# Patient Record
Sex: Male | Born: 1945 | Race: White | Hispanic: No | Marital: Married | Smoking: Former smoker
Health system: Southern US, Community
[De-identification: ages and names within clinical notes are randomized; demographics above are authoritative.]

## PROBLEM LIST (undated history)

## (undated) DIAGNOSIS — I779 Disorder of arteries and arterioles, unspecified: Secondary | ICD-10-CM

## (undated) DIAGNOSIS — I2510 Atherosclerotic heart disease of native coronary artery without angina pectoris: Secondary | ICD-10-CM

## (undated) DIAGNOSIS — Z720 Tobacco use: Secondary | ICD-10-CM

## (undated) DIAGNOSIS — I2102 ST elevation (STEMI) myocardial infarction involving left anterior descending coronary artery: Secondary | ICD-10-CM

## (undated) DIAGNOSIS — I5042 Chronic combined systolic (congestive) and diastolic (congestive) heart failure: Secondary | ICD-10-CM

## (undated) DIAGNOSIS — K219 Gastro-esophageal reflux disease without esophagitis: Secondary | ICD-10-CM

## (undated) DIAGNOSIS — I639 Cerebral infarction, unspecified: Secondary | ICD-10-CM

## (undated) DIAGNOSIS — E785 Hyperlipidemia, unspecified: Secondary | ICD-10-CM

## (undated) DIAGNOSIS — I739 Peripheral vascular disease, unspecified: Secondary | ICD-10-CM

## (undated) DIAGNOSIS — I10 Essential (primary) hypertension: Secondary | ICD-10-CM

## (undated) DIAGNOSIS — Z8719 Personal history of other diseases of the digestive system: Secondary | ICD-10-CM

## (undated) DIAGNOSIS — I255 Ischemic cardiomyopathy: Secondary | ICD-10-CM

## (undated) DIAGNOSIS — H539 Unspecified visual disturbance: Secondary | ICD-10-CM

## (undated) DIAGNOSIS — R7303 Prediabetes: Secondary | ICD-10-CM

## (undated) DIAGNOSIS — I63532 Cerebral infarction due to unspecified occlusion or stenosis of left posterior cerebral artery: Secondary | ICD-10-CM

## (undated) HISTORY — DX: Unspecified visual disturbance: H53.9

## (undated) HISTORY — PX: TRANSTHORACIC ECHOCARDIOGRAM: SHX275

## (undated) HISTORY — DX: Essential (primary) hypertension: I10

## (undated) HISTORY — DX: Atherosclerotic heart disease of native coronary artery without angina pectoris: I25.10

## (undated) HISTORY — DX: ST elevation (STEMI) myocardial infarction involving left anterior descending coronary artery: I21.02

## (undated) HISTORY — DX: Hyperlipidemia, unspecified: E78.5

## (undated) HISTORY — DX: Cerebral infarction, unspecified: I63.9

## (undated) HISTORY — DX: Personal history of other diseases of the digestive system: Z87.19

## (undated) HISTORY — PX: OTHER SURGICAL HISTORY: SHX169

## (undated) HISTORY — DX: Cerebral infarction due to unspecified occlusion or stenosis of left posterior cerebral artery: I63.532

## (undated) HISTORY — DX: Chronic combined systolic (congestive) and diastolic (congestive) heart failure: I50.42

---

## 2000-05-15 ENCOUNTER — Emergency Department (HOSPITAL_COMMUNITY): Admission: EM | Admit: 2000-05-15 | Discharge: 2000-05-15 | Payer: Self-pay | Admitting: Emergency Medicine

## 2000-05-20 ENCOUNTER — Emergency Department (HOSPITAL_COMMUNITY): Admission: EM | Admit: 2000-05-20 | Discharge: 2000-05-20 | Payer: Self-pay | Admitting: Emergency Medicine

## 2014-06-22 ENCOUNTER — Emergency Department (HOSPITAL_COMMUNITY): Payer: Medicare Other

## 2014-06-22 ENCOUNTER — Encounter (HOSPITAL_COMMUNITY): Payer: Self-pay | Admitting: *Deleted

## 2014-06-22 ENCOUNTER — Observation Stay (HOSPITAL_COMMUNITY)
Admission: EM | Admit: 2014-06-22 | Discharge: 2014-06-23 | Disposition: A | Discharge status: Home / Self Care | Payer: Medicare Other | Attending: Internal Medicine | Admitting: Internal Medicine

## 2014-06-22 ENCOUNTER — Observation Stay (HOSPITAL_COMMUNITY): Payer: Medicare Other

## 2014-06-22 DIAGNOSIS — I10 Essential (primary) hypertension: Secondary | ICD-10-CM | POA: Insufficient documentation

## 2014-06-22 DIAGNOSIS — E785 Hyperlipidemia, unspecified: Secondary | ICD-10-CM | POA: Diagnosis not present

## 2014-06-22 DIAGNOSIS — F17210 Nicotine dependence, cigarettes, uncomplicated: Secondary | ICD-10-CM | POA: Insufficient documentation

## 2014-06-22 DIAGNOSIS — R51 Headache: Secondary | ICD-10-CM

## 2014-06-22 DIAGNOSIS — Z7982 Long term (current) use of aspirin: Secondary | ICD-10-CM | POA: Diagnosis not present

## 2014-06-22 DIAGNOSIS — Z91040 Latex allergy status: Secondary | ICD-10-CM | POA: Insufficient documentation

## 2014-06-22 DIAGNOSIS — R519 Headache, unspecified: Secondary | ICD-10-CM

## 2014-06-22 DIAGNOSIS — R42 Dizziness and giddiness: Secondary | ICD-10-CM | POA: Diagnosis present

## 2014-06-22 DIAGNOSIS — R55 Syncope and collapse: Principal | ICD-10-CM | POA: Insufficient documentation

## 2014-06-22 DIAGNOSIS — R7989 Other specified abnormal findings of blood chemistry: Secondary | ICD-10-CM

## 2014-06-22 HISTORY — DX: Tobacco use: Z72.0

## 2014-06-22 LAB — COMPREHENSIVE METABOLIC PANEL
ALT: 20 U/L (ref 0–53)
AST: 19 U/L (ref 0–37)
Albumin: 3.8 g/dL (ref 3.5–5.2)
Alkaline Phosphatase: 64 U/L (ref 39–117)
Anion gap: 15 (ref 5–15)
BUN: 15 mg/dL (ref 6–23)
CALCIUM: 9.4 mg/dL (ref 8.4–10.5)
CO2: 22 mEq/L (ref 19–32)
Chloride: 102 mEq/L (ref 96–112)
Creatinine, Ser: 0.95 mg/dL (ref 0.50–1.35)
GFR, EST NON AFRICAN AMERICAN: 82 mL/min — AB (ref >90)
GLUCOSE: 110 mg/dL — AB (ref 70–99)
Potassium: 4.1 mEq/L (ref 3.7–5.3)
Sodium: 139 mEq/L (ref 137–147)
Total Bilirubin: 0.3 mg/dL (ref 0.3–1.2)
Total Protein: 7.7 g/dL (ref 6.0–8.3)

## 2014-06-22 LAB — TROPONIN I

## 2014-06-22 LAB — URINALYSIS, ROUTINE W REFLEX MICROSCOPIC
BILIRUBIN URINE: NEGATIVE
GLUCOSE, UA: NEGATIVE mg/dL
Hgb urine dipstick: NEGATIVE
Ketones, ur: NEGATIVE mg/dL
Leukocytes, UA: NEGATIVE
Nitrite: NEGATIVE
Protein, ur: NEGATIVE mg/dL
Specific Gravity, Urine: 1.025 (ref 1.005–1.030)
Urobilinogen, UA: 0.2 mg/dL (ref 0.0–1.0)
pH: 5 (ref 5.0–8.0)

## 2014-06-22 LAB — HEMOGLOBIN A1C
Hgb A1c MFr Bld: 6.1 % — ABNORMAL HIGH (ref <5.7)
Mean Plasma Glucose: 128 mg/dL — ABNORMAL HIGH (ref <117)

## 2014-06-22 LAB — CBC
HCT: 43.1 % (ref 39.0–52.0)
Hemoglobin: 14.6 g/dL (ref 13.0–17.0)
MCH: 30.8 pg (ref 26.0–34.0)
MCHC: 33.9 g/dL (ref 30.0–36.0)
MCV: 90.9 fL (ref 78.0–100.0)
PLATELETS: 313 10*3/uL (ref 150–400)
RBC: 4.74 MIL/uL (ref 4.22–5.81)
RDW: 12.7 % (ref 11.5–15.5)
WBC: 10.7 10*3/uL — ABNORMAL HIGH (ref 4.0–10.5)

## 2014-06-22 LAB — I-STAT TROPONIN, ED: Troponin i, poc: 0 ng/mL (ref 0.00–0.08)

## 2014-06-22 LAB — CBG MONITORING, ED: Glucose-Capillary: 105 mg/dL — ABNORMAL HIGH (ref 70–99)

## 2014-06-22 LAB — D-DIMER, QUANTITATIVE (NOT AT ARMC): D DIMER QUANT: 0.63 ug{FEU}/mL — AB (ref 0.00–0.48)

## 2014-06-22 MED ORDER — FENTANYL CITRATE 0.05 MG/ML IJ SOLN
50.0000 ug | Freq: Once | INTRAMUSCULAR | Status: AC
Start: 2014-06-22 — End: 2014-06-22
  Administered 2014-06-22: 50 ug via INTRAVENOUS
  Filled 2014-06-22: qty 2

## 2014-06-22 MED ORDER — SODIUM CHLORIDE 0.9 % IJ SOLN
3.0000 mL | Freq: Two times a day (BID) | INTRAMUSCULAR | Status: DC
  Administered 2014-06-22 – 2014-06-23 (×2): 3 mL via INTRAVENOUS

## 2014-06-22 MED ORDER — LISINOPRIL 10 MG PO TABS
10.0000 mg | ORAL_TABLET | Freq: Every day | ORAL | Status: DC
  Administered 2014-06-22 – 2014-06-23 (×2): 10 mg via ORAL
  Filled 2014-06-22 (×2): qty 1

## 2014-06-22 MED ORDER — ENOXAPARIN SODIUM 40 MG/0.4ML ~~LOC~~ SOLN
40.0000 mg | SUBCUTANEOUS | Status: DC
  Administered 2014-06-22: 40 mg via SUBCUTANEOUS
  Filled 2014-06-22: qty 0.4

## 2014-06-22 MED ORDER — SODIUM CHLORIDE 0.9 % IV SOLN
INTRAVENOUS | Status: AC
  Administered 2014-06-22: 15:00:00 via INTRAVENOUS

## 2014-06-22 MED ORDER — SODIUM CHLORIDE 0.9 % IV SOLN
INTRAVENOUS | Status: AC
  Administered 2014-06-22 – 2014-06-23 (×2): via INTRAVENOUS

## 2014-06-22 MED ORDER — ONDANSETRON HCL 4 MG/2ML IJ SOLN
4.0000 mg | Freq: Once | INTRAMUSCULAR | Status: AC
  Administered 2014-06-22: 4 mg via INTRAVENOUS
  Filled 2014-06-22: qty 2

## 2014-06-22 MED ORDER — SODIUM CHLORIDE 0.9 % IV BOLUS (SEPSIS)
1000.0000 mL | Freq: Once | INTRAVENOUS | Status: AC
  Administered 2014-06-22: 1000 mL via INTRAVENOUS

## 2014-06-22 MED ORDER — IOHEXOL 350 MG/ML SOLN
80.0000 mL | Freq: Once | INTRAVENOUS | Status: AC | PRN
  Administered 2014-06-22: 74 mL via INTRAVENOUS

## 2014-06-22 NOTE — ED Notes (Signed)
Return to room

## 2014-06-22 NOTE — ED Notes (Signed)
Notified RN of CBG 105 

## 2014-06-22 NOTE — H&P (Signed)
Date: 06/22/2014               Patient Name:  Damon Walker MRN: [id number]  DOB: [birth year] Age / Sex: 68 y.o., male   PCP: No primary care provider on file.         Medical Service: Internal Medicine Teaching Service         Attending Physician: Dr. Axel Filler, MD    First Contact: Dr. Genene Churn Pager: [phone]  Second Contact: Dr. Ronnald Ramp Pager: [phone]       After Hours (After 5p/  First Contact Pager: [phone]  weekends / holidays): Second Contact Pager: [phone]   Chief Complaint: diaphoresis, nausea, headache, dizziness  History of Present Illness:   68 y.o male without healthcare contact for last at least 15 years, no pmh, here with sudden diaphoresis, nausea, and headache frontal, and dizziness. Started suddenly in the barbershop as he was sitting down without anything causation. Denies any seizure activities, LOC, weakness, numbness, tingling. No chest pain, palpitation, SOB. Drinks 6 cups of coffee, doesn't drink much water. States he could have been dehydrated. Smokes 1.5 ppd. He was able to drive himself here. BP is high but never checked it. Not on meds. Denies any alcohol or substance abuse.   Denies hx of seizure, CVA. Denies family hx. No hx of blood clots. No recent travels or surgeries.   Meds: Current Facility-Administered Medications  Medication Dose Route Frequency Provider Last Rate Last Dose  . 0.9 %  sodium chloride infusion   Intravenous STAT Ezequiel Essex, MD 100 mL/hr at 06/22/14 1436    . 0.9 %  sodium chloride infusion   Intravenous Continuous Corky Sox, MD 75 mL/hr at 06/22/14 1721    . enoxaparin (LOVENOX) injection 40 mg  40 mg Subcutaneous Q24H Corky Sox, MD   40 mg at 06/22/14 1721  . lisinopril (PRINIVIL,ZESTRIL) tablet 10 mg  10 mg Oral Daily Corky Sox, MD   10 mg at 06/22/14 1722  . sodium chloride 0.9 % injection 3 mL  3 mL Intravenous Q12H Corky Sox, MD        Allergies: Allergies as of 06/22/2014 - Review Complete  06/22/2014  Allergen Reaction Noted  . Latex Itching and Rash 06/22/2014   History reviewed. No pertinent past medical history. History reviewed. No pertinent past surgical history. History reviewed. No pertinent family history. History   Social History  . Marital Status: Married    Spouse Name: N/A    Number of Children: N/A  . Years of Education: N/A   Occupational History  . Not on file.   Social History Main Topics  . Smoking status: Current Every Day Smoker    Types: Cigarettes  . Smokeless tobacco: Not on file  . Alcohol Use: Not on file  . Drug Use: Not on file  . Sexual Activity: Not on file   Other Topics Concern  . Not on file   Social History Narrative  . No narrative on file    Review of Systems: Review of Systems  Constitutional: Positive for diaphoresis. Negative for fever, chills and weight loss.  HENT: Negative for congestion, ear discharge, ear pain, nosebleeds and sore throat.   Eyes: Negative for blurred vision, double vision, photophobia, pain, discharge and redness.  Respiratory: Negative.   Cardiovascular: Negative for chest pain, palpitations, orthopnea and leg swelling.  Gastrointestinal: Positive for nausea. Negative for heartburn, vomiting, abdominal pain, diarrhea, constipation and blood in stool.  Genitourinary: Negative.  Musculoskeletal: Negative.   Skin: Negative.   Neurological: Positive for dizziness and headaches. Negative for tingling, tremors, sensory change, speech change, focal weakness, seizures, loss of consciousness and weakness.  Endo/Heme/Allergies: Negative.   Psychiatric/Behavioral: Negative.      Physical Exam: Blood pressure 151/61, pulse 60, temperature 98.3 F (36.8 C), temperature source Oral, resp. rate 18, height 5\' 8"  (1.727 m), weight 90.719 kg (200 lb), SpO2 96 %. Physical Exam  Constitutional: He is oriented to person, place, and time. He appears well-developed and well-nourished. No distress.  HENT:    Head: Normocephalic and atraumatic.  Right Ear: External ear normal.  Left Ear: External ear normal.  Nose: Nose normal.  Mouth/Throat: Oropharynx is clear and moist.  Eyes: Conjunctivae and EOM are normal. Pupils are equal, round, and reactive to light. Right eye exhibits no discharge. Left eye exhibits no discharge. No scleral icterus.  Neck: Normal range of motion. Neck supple. No JVD present. No tracheal deviation present. No thyromegaly present.  Cardiovascular: Regular rhythm, S1 normal, S2 normal, normal heart sounds and intact distal pulses.  Tachycardia present.  Exam reveals no gallop and no friction rub.   No murmur heard. Respiratory: Effort normal and breath sounds normal. No stridor. No respiratory distress. He has no wheezes. He has no rales. He exhibits no tenderness.  GI: Soft. Bowel sounds are normal. He exhibits no distension and no mass. There is no tenderness. There is no rebound and no guarding.  Musculoskeletal: Normal range of motion. He exhibits no edema or tenderness.  Lymphadenopathy:    He has no cervical adenopathy.  Neurological: He is alert and oriented to person, place, and time. He has normal reflexes. He displays normal reflexes. No cranial nerve deficit. He exhibits normal muscle tone. Coordination normal.  Skin: Skin is warm. No rash noted. He is not diaphoretic. No erythema. No pallor.  Psychiatric: He has a normal mood and affect.     Lab results: Basic Metabolic Panel:  Recent Labs  06/22/14 1135  NA 139  K 4.1  CL 102  CO2 22  GLUCOSE 110*  BUN 15  CREATININE 0.95  CALCIUM 9.4   Liver Function Tests:  Recent Labs  06/22/14 1135  AST 19  ALT 20  ALKPHOS 64  BILITOT 0.3  PROT 7.7  ALBUMIN 3.8   No results for input(s): LIPASE, AMYLASE in the last 72 hours. No results for input(s): AMMONIA in the last 72 hours. CBC:  Recent Labs  06/22/14 1135  WBC 10.7*  HGB 14.6  HCT 43.1  MCV 90.9  PLT 313   Cardiac Enzymes: No  results for input(s): CKTOTAL, CKMB, CKMBINDEX, TROPONINI in the last 72 hours. BNP: No results for input(s): PROBNP in the last 72 hours. D-Dimer: No results for input(s): DDIMER in the last 72 hours. CBG:  Recent Labs  06/22/14 1131  GLUCAP 105*   Hemoglobin A1C: No results for input(s): HGBA1C in the last 72 hours. Fasting Lipid Panel: No results for input(s): CHOL, HDL, LDLCALC, TRIG, CHOLHDL, LDLDIRECT in the last 72 hours. Thyroid Function Tests: No results for input(s): TSH, T4TOTAL, FREET4, T3FREE, THYROIDAB in the last 72 hours. Anemia Panel: No results for input(s): VITAMINB12, FOLATE, FERRITIN, TIBC, IRON, RETICCTPCT in the last 72 hours. Coagulation: No results for input(s): LABPROT, INR in the last 72 hours. Urine Drug Screen: Drugs of Abuse  No results found for: LABOPIA, COCAINSCRNUR, LABBENZ, AMPHETMU, THCU, LABBARB  Alcohol Level: No results for input(s): ETH in the last 72 hours.  Urinalysis:  Recent Labs  06/22/14 1325  COLORURINE YELLOW  LABSPEC 1.025  PHURINE 5.0  GLUCOSEU NEGATIVE  HGBUR NEGATIVE  BILIRUBINUR NEGATIVE  KETONESUR NEGATIVE  PROTEINUR NEGATIVE  UROBILINOGEN 0.2  NITRITE NEGATIVE  LEUKOCYTESUR NEGATIVE   Misc. Labs:  Imaging results:  Dg Chest 2 View  06/22/2014   CLINICAL DATA:  68 year old male with syncope weakness and headaches. Initial encounter.  EXAM: CHEST  2 VIEW  COMPARISON:  None.  FINDINGS: Lung volumes at the upper limits of normal, increased AP dimension to the chest. Mild cardiomegaly. Other mediastinal contours are within normal limits. Visualized tracheal air column is within normal limits. No pneumothorax, pulmonary edema, pleural effusion or confluent pulmonary opacity. Endplate degeneration in the spine. No acute osseous abnormality identified.  IMPRESSION: No acute cardiopulmonary abnormality.   Electronically Signed   By: Lars Pinks M.D.   On: 06/22/2014 12:46   Ct Head Wo Contrast  06/22/2014   CLINICAL DATA:   Initial evaluation for sudden onset of giddiness, followed by diaphoresis, diffuse headache, no known injury  EXAM: CT HEAD WITHOUT CONTRAST  TECHNIQUE: Contiguous axial images were obtained from the base of the skull through the vertex without intravenous contrast.  COMPARISON:  None.  FINDINGS: Mild diffuse atrophy. Mild low attenuation in the deep periventricular white matter, with some degree of focality 8 in the left thalamus. Prominent cerebral spinal fluid space middle cranial fossa, anterior to the inferior aspect of the right temporal lobe and medial to the sylvian fissure.  No hydrocephalus, hemorrhage, or extra-axial fluid. No vascular territory infarct.  Calvarium intact.  IMPRESSION: 1. Chronic involutional change with evidence of white matter small vessel ischemia. Possibility of subacute left deep white matter or thalamic lacunar infarct not excluded. 2. 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 Precise etiology uncertain as could be related to prior infarct or developmental abnormality. It does not appear to be acute.   Electronically Signed   By: Skipper Cliche M.D.   On: 06/22/2014 14:19    Other results: EKG: NSR  Assessment & Plan by Problem: Active Problems:   Near syncope   Pre-syncope  Near syncope - with diaphoresis, dizziness, nausea, and headache.  - diff is broad, including arythmia, volume depletion (from not drinking enough water and drinkinng 6 cups of coffee), MI, hypertensive encephalopathy, TIA, Has zero Wells, Mod Geneva 6 (medium) for PE.  - neuro intact. 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 Reviewed with radiologist, this is likely chornic, no midline shift. Does have chronic appearing small vessel ischemia. No acute findings concerning on CT head - will rule out MI: ekg normal, repeat ekg. Trend trops - orthostats - monitor for arrythmia telemetry - neuro  checks - ECHO to evaluate heart structures.  - D-dimer to rule out PE. Will do CT angio of positive. - volume replete 75cc/hr ns.  HTN - not on any meds as he didn't see any healthcare worker recently - started lisinopril 10mg  daily. Will need to go up slowly.   Dispo: Disposition is deferred at this time, awaiting improvement of current medical problems. Anticipated discharge in approximately 1-2 day(s).   The patient does have a current PCP (No primary care provider on file.) and does need an [facility] hospital follow-up appointment after discharge.  The patient does have transportation limitations that hinder transportation to clinic appointments.  Signed: Dellia Nims, MD 06/22/2014, 6:38 PM

## 2014-06-22 NOTE — ED Provider Notes (Signed)
CSN: [id number]     Arrival date & time 06/22/14  1113 History   First MD Initiated Contact with Patient 06/22/14 1125     Chief Complaint  Patient presents with  . Dizziness  . 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 He continues to feel nauseated and lightheaded. This is worse when he stands up. Denies any vertigo. Denies any headache, focal weakness, numbness, tingling. No chest pain or shortness of breath. Never happened before. No history of diabetes. Sugar 109. Denies any medical history and does not take any medications.  The history is provided by the patient. The history is limited by the condition of the patient.    History reviewed. No pertinent past medical history. History reviewed. No pertinent past surgical history. History reviewed. No pertinent family history. History  Substance Use Topics  . Smoking status: Current Every Day Smoker    Types: Cigarettes  . Smokeless tobacco: Not on file  . Alcohol Use: Not on file    Review of Systems  Constitutional: Positive for diaphoresis. Negative for fever, activity change and appetite change.  HENT: Negative for congestion and rhinorrhea.   Respiratory: Negative for cough, chest tightness and shortness of breath.   Cardiovascular: Negative for chest pain.  Gastrointestinal: Positive for nausea. Negative for vomiting, abdominal pain and diarrhea.  Genitourinary: Negative for dysuria, urgency and decreased urine volume.  Musculoskeletal: Negative for myalgias, back pain and arthralgias.  Skin: Negative for rash.  Neurological: Positive for dizziness, weakness and light-headedness. Negative for headaches.  A complete 10 system review of systems was obtained and all systems are negative except as noted in the  HPI and PMH.      Allergies  Latex  Home Medications   Prior to Admission medications   Not on File   BP 151/61 mmHg  Pulse 60  Temp(Src) 98.3 F (36.8 C) (Oral)  Resp 18  Ht 5\' 8"  (1.727 m)  Wt 200 lb (90.719 kg)  BMI 30.42 kg/m2  SpO2 96% Physical Exam  Constitutional: He is oriented to person, place, and time. He appears well-developed and well-nourished. No distress.  HENT:  Head: Normocephalic and atraumatic.  Mouth/Throat: Oropharynx is clear and moist. No oropharyngeal exudate.  Eyes: Conjunctivae and EOM are normal. Pupils are equal, round, and reactive to light.  Neck: Normal range of motion. Neck supple.  No meningismus.  Cardiovascular: Normal rate, regular rhythm, normal heart sounds and intact distal pulses.   No murmur heard. Pulmonary/Chest: Effort normal and breath sounds normal. No respiratory distress.  Abdominal: Soft. There is no tenderness. There is no rebound and no guarding.  Musculoskeletal: Normal range of motion. He exhibits no edema or tenderness.  Neurological: He is alert and oriented to person, place, and time. No cranial nerve deficit. He exhibits normal muscle tone. Coordination normal.  No ataxia on finger to nose bilaterally. No pronator drift. 5/5 strength throughout. CN 2-12 intact. Negative Romberg. Equal grip strength. Sensation intact. Gait is normal.   Skin: Skin is warm.  Psychiatric: He has a normal mood and affect. His behavior is normal.  Nursing note and vitals reviewed.   ED Course  Procedures (including critical care time) Labs Review Labs Reviewed  CBC - Abnormal; Notable for the following:    WBC 10.7 (*)    All other components within normal limits  COMPREHENSIVE METABOLIC  PANEL - Abnormal; Notable for the following:    Glucose, Bld 110 (*)    GFR calc non Af Amer 82 (*)    All other components within normal limits  CBG MONITORING, ED - Abnormal; Notable for the following:    Glucose-Capillary 105 (*)    All  other components within normal limits  URINALYSIS, ROUTINE W REFLEX MICROSCOPIC  TROPONIN I  TROPONIN I  TROPONIN I  HEMOGLOBIN A1C  D-DIMER, QUANTITATIVE  CBC  BASIC METABOLIC PANEL  I-STAT TROPOININ, ED    Imaging Review Dg Chest 2 View  06/22/2014   CLINICAL DATA:  68 year old male with syncope weakness and headaches. Initial encounter.  EXAM: CHEST  2 VIEW  COMPARISON:  None.  FINDINGS: Lung volumes at the upper limits of normal, increased AP dimension to the chest. Mild cardiomegaly. Other mediastinal contours are within normal limits. Visualized tracheal air column is within normal limits. No pneumothorax, pulmonary edema, pleural effusion or confluent pulmonary opacity. Endplate degeneration in the spine. No acute osseous abnormality identified.  IMPRESSION: No acute cardiopulmonary abnormality.   Electronically Signed   By: Lars Pinks M.D.   On: 06/22/2014 12:46   Ct Head Wo Contrast  06/22/2014   CLINICAL DATA:  Initial evaluation for sudden onset of giddiness, followed by diaphoresis, diffuse headache, no known injury  EXAM: CT HEAD WITHOUT CONTRAST  TECHNIQUE: Contiguous axial images were obtained from the base of the skull through the vertex without intravenous contrast.  COMPARISON:  None.  FINDINGS: Mild diffuse atrophy. Mild low attenuation in the deep periventricular white matter, with some degree of focality 8 in the left thalamus. Prominent cerebral spinal fluid space middle cranial fossa, anterior to the inferior aspect of the right temporal lobe and medial to the sylvian fissure.  No hydrocephalus, hemorrhage, or extra-axial fluid. No vascular territory infarct.  Calvarium intact.  IMPRESSION: 1. Chronic involutional change with evidence of white matter small vessel ischemia. Possibility of subacute left deep white matter or thalamic lacunar infarct not excluded. 2. 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  Precise etiology uncertain as could be related to prior infarct or developmental abnormality. It does not appear to be acute.   Electronically Signed   By: Skipper Cliche M.D.   On: 06/22/2014 14:19     EKG Interpretation   Date/Time:  Wednesday June 22 2014 11:17:19 EST Ventricular Rate:  86 PR Interval:  156 QRS Duration: 86 QT Interval:  386 QTC Calculation: 461 R Axis:   5 Text Interpretation:  Normal sinus rhythm Possible Anteroseptal infarct ,  age undetermined Abnormal ECG No previous ECGs available Confirmed by  Yaurel  MD, Sarafina Puthoff [phone]) on 06/22/2014 11:24:36 AM      MDM   Final diagnoses:  Syncope  Headache  Near syncope   Near syncopal episode with diaphoresis, lightheadedness and nausea. No chest pain or shortness of breath. No abdominal pain or back pain.  EKG nonischemic. Orthostatics negative. Troponin negative.  Near syncope proceeded by diaphoresis and nausea.  Concern for possible anginal equivalent.  Given age and risk factors, will observe in hospital.       Ezequiel Essex, MD 06/22/14 [phone]

## 2014-06-22 NOTE — ED Notes (Addendum)
Pt reports onset approx 1045 of feeling dizzy, lightheaded and nauseated. Denies any sob or cp. ekg done at triage. Grips are equal and airway intact.

## 2014-06-22 NOTE — Progress Notes (Signed)
Pt arrived to room with family from ED.  He was in no distress and denies any pain at this time.  Safety precautions and orders reviewed with patient and familly.  Tele applied and confirmed. Attending physician already put in orders, will continue to monitor. Cheron Schaumann RN

## 2014-06-23 ENCOUNTER — Encounter (HOSPITAL_COMMUNITY): Payer: Self-pay | Admitting: Nurse Practitioner

## 2014-06-23 ENCOUNTER — Other Ambulatory Visit: Payer: Self-pay | Admitting: Nurse Practitioner

## 2014-06-23 DIAGNOSIS — R55 Syncope and collapse: Secondary | ICD-10-CM

## 2014-06-23 DIAGNOSIS — I10 Essential (primary) hypertension: Secondary | ICD-10-CM | POA: Diagnosis not present

## 2014-06-23 DIAGNOSIS — R0989 Other specified symptoms and signs involving the circulatory and respiratory systems: Secondary | ICD-10-CM

## 2014-06-23 DIAGNOSIS — F17210 Nicotine dependence, cigarettes, uncomplicated: Secondary | ICD-10-CM | POA: Diagnosis not present

## 2014-06-23 DIAGNOSIS — R0609 Other forms of dyspnea: Secondary | ICD-10-CM

## 2014-06-23 DIAGNOSIS — E785 Hyperlipidemia, unspecified: Secondary | ICD-10-CM | POA: Diagnosis not present

## 2014-06-23 LAB — CBC
HEMATOCRIT: 39.8 % (ref 39.0–52.0)
HEMOGLOBIN: 13 g/dL (ref 13.0–17.0)
MCH: 30.1 pg (ref 26.0–34.0)
MCHC: 32.7 g/dL (ref 30.0–36.0)
MCV: 92.1 fL (ref 78.0–100.0)
Platelets: 286 10*3/uL (ref 150–400)
RBC: 4.32 MIL/uL (ref 4.22–5.81)
RDW: 12.7 % (ref 11.5–15.5)
WBC: 9.4 10*3/uL (ref 4.0–10.5)

## 2014-06-23 LAB — BASIC METABOLIC PANEL
Anion gap: 10 (ref 5–15)
BUN: 12 mg/dL (ref 6–23)
CALCIUM: 8.7 mg/dL (ref 8.4–10.5)
CO2: 26 mEq/L (ref 19–32)
Chloride: 105 mEq/L (ref 96–112)
Creatinine, Ser: 0.94 mg/dL (ref 0.50–1.35)
GFR, EST NON AFRICAN AMERICAN: 83 mL/min — AB (ref >90)
GLUCOSE: 94 mg/dL (ref 70–99)
POTASSIUM: 4.8 meq/L (ref 3.7–5.3)
Sodium: 141 mEq/L (ref 137–147)

## 2014-06-23 LAB — LIPID PANEL
Cholesterol: 188 mg/dL (ref 0–200)
HDL: 29 mg/dL — ABNORMAL LOW (ref >39)
LDL CALC: 128 mg/dL — AB (ref 0–99)
TRIGLYCERIDES: 157 mg/dL — AB (ref <150)
Total CHOL/HDL Ratio: 6.5 RATIO
VLDL: 31 mg/dL (ref 0–40)

## 2014-06-23 LAB — TSH: TSH: 1.67 u[IU]/mL (ref 0.350–4.500)

## 2014-06-23 LAB — TROPONIN I

## 2014-06-23 MED ORDER — ATORVASTATIN CALCIUM 40 MG PO TABS: 40.0000 mg | ORAL_TABLET | Freq: Every day | ORAL | Status: DC

## 2014-06-23 MED ORDER — ASPIRIN 81 MG PO TBEC: 81.0000 mg | DELAYED_RELEASE_TABLET | Freq: Every day | ORAL | Status: DC

## 2014-06-23 MED ORDER — LISINOPRIL 10 MG PO TABS
10.0000 mg | ORAL_TABLET | Freq: Every day | ORAL | Status: DC
Start: 2014-06-23 — End: 2014-12-15

## 2014-06-23 MED ORDER — ASPIRIN EC 81 MG PO TBEC
81.0000 mg | DELAYED_RELEASE_TABLET | Freq: Every day | ORAL | Status: DC
  Administered 2014-06-23: 81 mg via ORAL
  Filled 2014-06-23: qty 1

## 2014-06-23 NOTE — Discharge Instructions (Signed)
You were admitted with pre-syncope. Workup here shows you have mild congestive heart failure, hypertension, and hyperlipidemia. You do have some chronic changes on your CT head. Neurology outpatient follow up will need to be setup for you. You should also follow up with the cardiology doctors outpatient for further workup.  Please take your three medicines going forward for stroke and heart attack prevention.

## 2014-06-23 NOTE — Progress Notes (Addendum)
Subjective: Doing well. No chest pain, dizziness, n/v, sob. Has some sweating but feels the room is warm. The room felt warm to [place] as well during the interview. No complaints.  Objective: Vital signs in last 24 hours: Filed Vitals:   06/23/14 0158 06/23/14 0506 06/23/14 0909 06/23/14 1326  BP: 127/65 160/57 134/54 147/50  Pulse: 65 52 52 56  Temp: 98.7 F (37.1 C) 98.3 F (36.8 C) 98.8 F (37.1 C) 98.2 F (36.8 C)  TempSrc: Oral Oral Oral Oral  Resp: 18 18 18 18   Height:      Weight:      SpO2: 95% 96% 97% 98%   Weight change:   Intake/Output Summary (Last 24 hours) at 06/23/14 1344 Last data filed at 06/23/14 1324  Gross per 24 hour  Intake    520 ml  Output    700 ml  Net   -180 ml   Vitals reviewed. General: resting in bed, NAD HEENT: PERRL, EOMI, no scleral icterus Cardiac: RRR, no rubs, murmurs or gallops Pulm: clear to auscultation bilaterally, no wheezes, rales, or rhonchi Abd: soft, nontender, nondistended, BS present Ext: warm and well perfused, no pedal edema Neuro: alert and oriented X3, cranial nerves II-XII grossly intact, strength and sensation to light touch equal in bilateral upper and lower extremities  Lab Results: Basic Metabolic Panel:  Recent Labs Lab 06/22/14 1135 06/23/14 0547  NA 139 141  K 4.1 4.8  CL 102 105  CO2 22 26  GLUCOSE 110* 94  BUN 15 12  CREATININE 0.95 0.94  CALCIUM 9.4 8.7   Liver Function Tests:  Recent Labs Lab 06/22/14 1135  AST 19  ALT 20  ALKPHOS 64  BILITOT 0.3  PROT 7.7  ALBUMIN 3.8   No results for input(s): LIPASE, AMYLASE in the last 168 hours. No results for input(s): AMMONIA in the last 168 hours. CBC:  Recent Labs Lab 06/22/14 1135 06/23/14 0547  WBC 10.7* 9.4  HGB 14.6 13.0  HCT 43.1 39.8  MCV 90.9 92.1  PLT 313 286   Cardiac Enzymes:  Recent Labs Lab 06/22/14 1850 06/22/14 2224 06/23/14 0547  TROPONINI <0.30 <0.30 <0.30   BNP: No results for input(s): PROBNP in the last  168 hours. D-Dimer:  Recent Labs Lab 06/22/14 1850  DDIMER 0.63*   CBG:  Recent Labs Lab 06/22/14 1131  GLUCAP 105*   Hemoglobin A1C:  Recent Labs Lab 06/22/14 1850  HGBA1C 6.1*   Fasting Lipid Panel:  Recent Labs Lab 06/23/14 0547  CHOL 188  HDL 29*  LDLCALC 128*  TRIG 157*  CHOLHDL 6.5   Thyroid Function Tests: No results for input(s): TSH, T4TOTAL, FREET4, T3FREE, THYROIDAB in the last 168 hours. Coagulation: No results for input(s): LABPROT, INR in the last 168 hours. Anemia Panel: No results for input(s): VITAMINB12, FOLATE, FERRITIN, TIBC, IRON, RETICCTPCT in the last 168 hours. Urine Drug Screen: Drugs of Abuse  No results found for: LABOPIA, COCAINSCRNUR, LABBENZ, AMPHETMU, THCU, LABBARB  Alcohol Level: No results for input(s): ETH in the last 168 hours. Urinalysis:  Recent Labs Lab 06/22/14 1325  COLORURINE YELLOW  LABSPEC 1.025  PHURINE 5.0  GLUCOSEU NEGATIVE  HGBUR NEGATIVE  BILIRUBINUR NEGATIVE  KETONESUR NEGATIVE  PROTEINUR NEGATIVE  UROBILINOGEN 0.2  NITRITE NEGATIVE  LEUKOCYTESUR NEGATIVE   Misc. Labs:  Micro Results: No results found for this or any previous visit (from the past 240 hour(s)). Studies/Results: Dg Chest 2 View  06/22/2014   CLINICAL DATA:  68 year old male  with syncope weakness and headaches. Initial encounter.  EXAM: CHEST  2 VIEW  COMPARISON:  None.  FINDINGS: Lung volumes at the upper limits of normal, increased AP dimension to the chest. Mild cardiomegaly. Other mediastinal contours are within normal limits. Visualized tracheal air column is within normal limits. No pneumothorax, pulmonary edema, pleural effusion or confluent pulmonary opacity. Endplate degeneration in the spine. No acute osseous abnormality identified.  IMPRESSION: No acute cardiopulmonary abnormality.   Electronically Signed   By: Lars Pinks M.D.   On: 06/22/2014 12:46   Ct Head Wo Contrast  06/22/2014   CLINICAL DATA:  Initial evaluation for  sudden onset of giddiness, followed by diaphoresis, diffuse headache, no known injury  EXAM: CT HEAD WITHOUT CONTRAST  TECHNIQUE: Contiguous axial images were obtained from the base of the skull through the vertex without intravenous contrast.  COMPARISON:  None.  FINDINGS: Mild diffuse atrophy. Mild low attenuation in the deep periventricular white matter, with some degree of focality 8 in the left thalamus. Prominent cerebral spinal fluid space middle cranial fossa, anterior to the inferior aspect of the right temporal lobe and medial to the sylvian fissure.  No hydrocephalus, hemorrhage, or extra-axial fluid. No vascular territory infarct.  Calvarium intact.  IMPRESSION: 1. Chronic involutional change with evidence of white matter small vessel ischemia. Possibility of subacute left deep white matter or thalamic lacunar infarct not excluded. 2. 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 Precise etiology uncertain as could be related to prior infarct or developmental abnormality. It does not appear to be acute.   Electronically Signed   By: Skipper Cliche M.D.   On: 06/22/2014 14:19   Ct Angio Chest Pe W/cm &/or Wo Cm  06/22/2014   CLINICAL DATA:  Positive D-dimer  EXAM: CT ANGIOGRAPHY CHEST WITH CONTRAST  TECHNIQUE: Multidetector CT imaging of the chest was performed using the standard protocol during bolus administration of intravenous contrast. Multiplanar CT image reconstructions and MIPs were obtained to evaluate the vascular anatomy.  CONTRAST:  64mL OMNIPAQUE IOHEXOL 350 MG/ML SOLN  COMPARISON:  Chest x-ray from earlier in the same day.  FINDINGS: The lungs are well aerated bilaterally. Mild dependent atelectatic changes are noted in the left upper lobe.  The thoracic aorta and its branches are within normal limits without evidence of dissection or aneurysmal dilatation. Soft atherosclerotic plaque is noted in the descending thoracic  aorta appear in the pulmonary artery is well visualized and demonstrates a normal branching pattern. No filling defects are identified to suggest pulmonary emboli. No hilar or mediastinal adenopathy is seen. Mild coronary calcifications are noted.  Visualized upper abdomen shows a hypodense lesion within the left adrenal gland measuring 2.7 cm in greatest dimension. This likely represents an adenoma but is incompletely characterized on this exam. Degenerative changes of the thoracic spine are seen. No other focal bony abnormality is noted.  Review of the MIP images confirms the above findings.  IMPRESSION: No evidence of pulmonary emboli.  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  Mild dependent atelectatic changes.   Electronically Signed   By: Inez Catalina M.D.   On: 06/22/2014 22:30   Medications: I have reviewed the patient's current medications. Scheduled Meds: . aspirin EC  81 mg Oral Daily  . atorvastatin  40 mg Oral q1800  . enoxaparin (LOVENOX) injection  40 mg Subcutaneous Q24H  . lisinopril  10 mg Oral Daily  . sodium chloride  3 mL Intravenous Q12H  Continuous Infusions: . sodium chloride 75 mL/hr at 06/23/14 0802   PRN Meds:. Assessment/Plan: Active Problems:   Near syncope   Pre-syncope  Pre-syncope with diaphoresis, nausea, frontal headache, dizziness -diff is broad, including arrythmia, volume depletion (from not drinking enough water and drinking 6 cups of coffee), ischemia, TIA. Low wells, mod geneva for PE. CT angio done since d-dimer positive. No PE. - neurologically intact. 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 Reviewed with radiologist, this is likely chornic, no midline shift. Does have chronic appearing small vessel ischemia. No acute findings concerning on CT head - EKG shows TWI today infero laterally. trops negative. Denies chest pain.  - cards consulted: on their exam found carotid bruit. Wants outpatient carotid doppler  and lexiscan.  - ECHO done shows mild LVH, 50-55% EF, grade 2 diastolic dysfunction. - discussed with Dr. Leonel Ramsay for their recs in terms of CT head fiindings which are likely chronic or incidental. He agrees to doing outpatient management with outpatient neuro follow up as this seems chronic without matching his symptoms. Also his deep thalamic lesion doesn't match with carotid stenosis.  - stroke prevention improtant: Hgba1c is 6.1 (goal <7), LDL goal <70, started lipitor 40mg  daily, ASA 81 anti-platelet therapy, HTN goal <140/90.  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  - lisinopril 10mg  daily, will start ASA 81mg .   HLD - new diagnosis.10 year risk 65%. - Started lipitor 40mg  daily.  HTN - new diagnosis.  - was in 170-180's improved with lisinopril 10mg . Will keep on this given CHF. F/up clinic and manage further.  Tobacco abuse - cessation encouraged.  dvt ppx: lovenox  Dispo: Disposition is deferred at this time, awaiting improvement of current medical problems.  Anticipated discharge in approximately 1-2 day(s).   The patient does have a current PCP (No primary care provider on file.) and does need an [facility] hospital follow-up appointment after discharge.  The patient does have transportation limitations that hinder transportation to clinic appointments.  .Services Needed at time of discharge: Y = Yes, Blank = No PT:   OT:   RN:   Equipment:   Other:     LOS: 1 day   Dellia Nims, MD 06/23/2014, 1:44 PM

## 2014-06-23 NOTE — Progress Notes (Signed)
  Echocardiogram 2D Echocardiogram has been performed.  Damon Walker 06/23/2014, 10:11 AM

## 2014-06-23 NOTE — Discharge Summary (Addendum)
Name: Damon Walker MRN: [id number] DOB: 1946-01-23 68 y.o. PCP: No primary care provider on file.  Date of Admission: 06/22/2014 11:21 AM Date of Discharge: 06/23/2014 Attending Physician: Axel Filler, MD  Discharge Diagnosis:  Active Problems:   Near syncope   Pre-syncope  Discharge Medications:   Medication List    TAKE these medications        aspirin 81 MG EC tablet  Take 1 tablet (81 mg total) by mouth daily.     atorvastatin 40 MG tablet  Commonly known as:  LIPITOR  Take 1 tablet (40 mg total) by mouth daily at 6 PM.     lisinopril 10 MG tablet  Commonly known as:  PRINIVIL,ZESTRIL  Take 1 tablet (10 mg total) by mouth daily.        Disposition and follow-up:   Damon Walker was discharged from [facility] in Stable condition.  At the hospital follow up visit please address:  1.  Please assess his BP. We started 10mg  lisinopril daily. Go up as needed. Goal <140/90.  Also started asa 81 and lipitor 40mg  for stroke prevention and CHF. Please see how he is doing on those.  Please have him setup neurology follow up. Has chornic appearing changes on CT head. Please make sure he gets outpatient carotid doppler and follows up with cardiology for [place] outpatient.   2.  Labs / imaging needed at time of follow-up:   3.  Pending labs/ test needing follow-up:   Follow-up Appointments: Follow-up Information    Follow up with [facility] On 06/28/2014.   Why:  9:15 AM - Pharmacologic Stress Test - Nothing to eat or drink after midnight.  No caffeine on 11/23.   Contact information:   [address] [address] [phone]      Follow up with [place] office On 06/28/2014.   Why:  1:30 PM - carotid ultrasound   Contact information:   [address] [place] [place] [address] [phone]      Follow up with Lyda Jester, PA-C On 07/11/2014.   Specialty:  Cardiology   Why:  8:30 AM - Dr. Allison Quarry PA.   Contact information:   [place] [address] [phone]       Follow up with Otho Bellows, MD. Go on 07/05/2014.   Specialty:  Internal Medicine   Why:  10:15 AM. For follow-up    Contact information:   [place] [address] [phone]       Discharge Instructions:   Consultations: Treatment Team:  Rounding Lbcardiology, MD  Procedures Performed:  Dg Chest 2 View  06/22/2014   CLINICAL DATA:  68 year old male with syncope weakness and headaches. Initial encounter.  EXAM: CHEST  2 VIEW  COMPARISON:  None.  FINDINGS: Lung volumes at the upper limits of normal, increased AP dimension to the chest. Mild cardiomegaly. Other mediastinal contours are within normal limits. Visualized tracheal air column is within normal limits. No pneumothorax, pulmonary edema, pleural effusion or confluent pulmonary opacity. Endplate degeneration in the spine. No acute osseous abnormality identified.  IMPRESSION: No acute cardiopulmonary abnormality.   Electronically Signed   By: Lars Pinks M.D.   On: 06/22/2014 12:46   Ct Head Wo Contrast  06/22/2014   CLINICAL DATA:  Initial evaluation for sudden onset of giddiness, followed by diaphoresis, diffuse headache, no known injury  EXAM: CT HEAD WITHOUT CONTRAST  TECHNIQUE: Contiguous axial images were obtained from the base of the skull through the vertex without intravenous contrast.  COMPARISON:  None.  FINDINGS: Mild diffuse atrophy. Mild low attenuation in the deep periventricular white matter, with some degree of focality 8 in the left thalamus. Prominent cerebral spinal fluid space middle cranial fossa, anterior to the inferior aspect of the right temporal lobe and medial to the sylvian fissure.  No hydrocephalus, hemorrhage, or extra-axial fluid. No vascular territory infarct.  Calvarium intact.  IMPRESSION: 1. Chronic involutional change with evidence  of white matter small vessel ischemia. Possibility of subacute left deep white matter or thalamic lacunar infarct not excluded. 2. 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 Precise etiology uncertain as could be related to prior infarct or developmental abnormality. It does not appear to be acute.   Electronically Signed   By: Skipper Cliche M.D.   On: 06/22/2014 14:19   Ct Angio Chest Pe W/cm &/or Wo Cm  06/22/2014   CLINICAL DATA:  Positive D-dimer  EXAM: CT ANGIOGRAPHY CHEST WITH CONTRAST  TECHNIQUE: Multidetector CT imaging of the chest was performed using the standard protocol during bolus administration of intravenous contrast. Multiplanar CT image reconstructions and MIPs were obtained to evaluate the vascular anatomy.  CONTRAST:  75mL OMNIPAQUE IOHEXOL 350 MG/ML SOLN  COMPARISON:  Chest x-ray from earlier in the same day.  FINDINGS: The lungs are well aerated bilaterally. Mild dependent atelectatic changes are noted in the left upper lobe.  The thoracic aorta and its branches are within normal limits without evidence of dissection or aneurysmal dilatation. Soft atherosclerotic plaque is noted in the descending thoracic aorta appear in the pulmonary artery is well visualized and demonstrates a normal branching pattern. No filling defects are identified to suggest pulmonary emboli. No hilar or mediastinal adenopathy is seen. Mild coronary calcifications are noted.  Visualized upper abdomen shows a hypodense lesion within the left adrenal gland measuring 2.7 cm in greatest dimension. This likely represents an adenoma but is incompletely characterized on this exam. Degenerative changes of the thoracic spine are seen. No other focal bony abnormality is noted.  Review of the MIP images confirms the above findings.  IMPRESSION: No evidence of pulmonary emboli.  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  Mild dependent atelectatic  changes.   Electronically Signed   By: Inez Catalina M.D.   On: 06/22/2014 22:30    2D Echo:   Left ventricle: The cavity size was normal. Wall thickness was increased in a pattern of mild LVH. There was mild concentric hypertrophy. Systolic function was normal. The estimated ejection fraction was in the range of 50% to 55%. Wall motion was normal; there were no regional wall motion abnormalities. Features are consistent with a pseudonormal left ventricular filling pattern, with concomitant abnormal relaxation and increased filling pressure (grade 2 diastolic dysfunction). - Aortic root: The aortic root was trivially dilated. - Left atrium: The atrium was moderately to severely dilated. - Right atrium: The atrium was mildly dilated.  Cardiac Cath:   Admission HPI:   68 y.o male without healthcare contact for last at least 15 years, no pmh, here with sudden diaphoresis, nausea, and headache frontal, and dizziness. Started suddenly in the barbershop as he was sitting down without anything causation. Denies any seizure activities, LOC, weakness, numbness, tingling. No chest pain, palpitation, SOB. Drinks 6 cups of coffee, doesn't drink much water. States he could  have been dehydrated. Smokes 1.5 ppd. He was able to drive himself here. BP is high but never checked it. Not on meds. Denies any alcohol or substance abuse.   Denies hx of seizure, CVA. Denies family hx. No hx of blood clots. No recent travels or surgeries.    Hospital Course by problem list: Active Problems:   Near syncope   Pre-syncope  68 yo with no pmh, no medical visit in 15+ years, here with pre-syncope, diaphoresis.   Pre-syncope with diaphoresis, nausea, frontal headache, dizziness -diff is broad, including arrythmia, volume depletion (from not drinking enough water and drinking 6 cups of coffee), ischemia, TIA. Low wells, mod geneva for PE. CT angio done since d-dimer positive. No PE. - neurologically  intact. 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 Reviewed with radiologist, this is likely chornic, no midline shift. Does have chronic appearing small vessel ischemia. No acute findings concerning on CT head - EKG shows TWI today infero laterally. trops negative. Denies chest pain.  - cards consulted: on their exam found carotid bruit. Wants outpatient carotid doppler and lexiscan.  - ECHO done shows mild LVH, 50-55% EF, grade 2 diastolic dysfunction. - discussed with Dr. Leonel Ramsay for their recs in terms of CT head fiindings which are likely chronic or incidental. He agrees to doing outpatient management with outpatient neuro follow up as this seems chronic without matching his symptoms. Also his deep thalamic lesion doesn't match with carotid stenosis.  - stroke prevention improtant: Hgba1c is 6.1 (goal <7), LDL goal <70, started lipitor 40mg  daily, ASA 81 anti-platelet therapy, HTN goal <140/90.  Diastolic grade 2 CHF, EF 22-48% - new diagnosis.  - started lisinopril 10mg  daily, started ASA 81mg .   HLD - new diagnosis.10 year risk 65%. - Started lipitor 40mg  daily.  HTN - new diagnosis.  - was in 170-180's improved with lisinopril 10mg . Will keep on this given CHF. F/up clinic and manage further.  Tobacco abuse - cessation encouraged.   Discharge Vitals:   BP 147/50 mmHg  Pulse 56  Temp(Src) 98.2 F (36.8 C) (Oral)  Resp 18  Ht 5\' 8"  (1.727 m)  Wt 90.719 kg (200 lb)  BMI 30.42 kg/m2  SpO2 98%  Discharge Labs:  Results for orders placed or performed during the hospital encounter of 06/22/14 (from the past 24 hour(s))  Troponin I     Status: None   Collection Time: 06/22/14  6:50 PM  Result Value Ref Range   Troponin I <0.30 <0.30 ng/mL  Hemoglobin A1c     Status: Abnormal   Collection Time: 06/22/14  6:50 PM  Result Value Ref Range   Hgb A1c MFr Bld 6.1 (H) <5.7 %   Mean Plasma Glucose 128 (H) <117 mg/dL  D-dimer, quantitative     Status:  Abnormal   Collection Time: 06/22/14  6:50 PM  Result Value Ref Range   D-Dimer, Quant 0.63 (H) 0.00 - 0.48 ug/mL-FEU  Troponin I     Status: None   Collection Time: 06/22/14 10:24 PM  Result Value Ref Range   Troponin I <0.30 <0.30 ng/mL  Troponin I     Status: None   Collection Time: 06/23/14  5:47 AM  Result Value Ref Range   Troponin I <0.30 <0.30 ng/mL  CBC     Status: None   Collection Time: 06/23/14  5:47 AM  Result Value Ref Range   WBC 9.4 4.0 - 10.5 K/uL   RBC 4.32 4.22 - 5.81 MIL/uL  Hemoglobin 13.0 13.0 - 17.0 g/dL   HCT 39.8 39.0 - 52.0 %   MCV 92.1 78.0 - 100.0 fL   MCH 30.1 26.0 - 34.0 pg   MCHC 32.7 30.0 - 36.0 g/dL   RDW 12.7 11.5 - 15.5 %   Platelets 286 150 - 400 K/uL  Basic metabolic panel     Status: Abnormal   Collection Time: 06/23/14  5:47 AM  Result Value Ref Range   Sodium 141 137 - 147 mEq/L   Potassium 4.8 3.7 - 5.3 mEq/L   Chloride 105 96 - 112 mEq/L   CO2 26 19 - 32 mEq/L   Glucose, Bld 94 70 - 99 mg/dL   BUN 12 6 - 23 mg/dL   Creatinine, Ser 0.94 0.50 - 1.35 mg/dL   Calcium 8.7 8.4 - 10.5 mg/dL   GFR calc non Af Amer 83 (L) >90 mL/min   GFR calc Af Amer >90 >90 mL/min   Anion gap 10 5 - 15  Lipid panel     Status: Abnormal   Collection Time: 06/23/14  5:47 AM  Result Value Ref Range   Cholesterol 188 0 - 200 mg/dL   Triglycerides 157 (H) <150 mg/dL   HDL 29 (L) >39 mg/dL   Total CHOL/HDL Ratio 6.5 RATIO   VLDL 31 0 - 40 mg/dL   LDL Cholesterol 128 (H) 0 - 99 mg/dL    Signed: Dellia Nims, MD 06/23/2014, 3:45 PM    Services Ordered on Discharge:  Equipment Ordered on Discharge:

## 2014-06-23 NOTE — Progress Notes (Signed)
Internal Medicine Attending Admission Note Date: 06/23/2014  Patient name: Damon Walker Medical record number: [id number] Date of birth: [birth year] Age: 68 y.o. Gender: male  I saw and evaluated the patient. I reviewed the resident's note and I agree with the resident's findings and plan as documented in the resident's note, with the following additional comments.  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  He had some associated frontal headache which resolved; he denies any other symptoms.  He denies any fever, chills, chest pain, shortness of breath, abdominal pain, vomiting, palpitations, syncope or near syncope, weakness, numbness, gait instability, speech or swallowing difficulty, lower extremity edema, or other problems.   Physical Exam - key components related to admission:  Filed Vitals:   06/23/14 0158 06/23/14 0506 06/23/14 0909 06/23/14 1326  BP: 127/65 160/57 134/54 147/50  Pulse: 65 52 52 56  Temp: 98.7 F (37.1 C) 98.3 F (36.8 C) 98.8 F (37.1 C) 98.2 F (36.8 C)  TempSrc: Oral Oral Oral Oral  Resp: 18 18 18 18   Height:      Weight:      SpO2: 95% 96% 97% 98%    Gen. alert, oriented, no distress Neck: Bilateral carotid bruits Lungs: Clear Heart: Regular; S1 and S2, no S3, no S4, no murmurs Abdomen: Bowel sounds present, soft, nontender Extremities: No edema; no calf tenderness Neurologic: Alert and oriented; cranial nerves II through XII intact; motor 5 over 5 throughout; cerebellar intact by finger to nose  Lab results:   Basic Metabolic Panel:  Recent Labs  06/22/14 1135 06/23/14 0547  NA 139 141  K 4.1 4.8  CL 102 105  CO2 22 26  GLUCOSE 110* 94  BUN 15 12  CREATININE 0.95 0.94  CALCIUM 9.4 8.7    Liver Function  Tests:  Recent Labs  06/22/14 1135  AST 19  ALT 20  ALKPHOS 64  BILITOT 0.3  PROT 7.7  ALBUMIN 3.8     CBC:  Recent Labs  06/22/14 1135 06/23/14 0547  WBC 10.7* 9.4  HGB 14.6 13.0  HCT 43.1 39.8  MCV 90.9 92.1  PLT 313 286     Cardiac Enzymes:  Recent Labs  06/22/14 1850 06/22/14 2224 06/23/14 0547  TROPONINI <0.30 <0.30 <0.30   Lipids:    Component Value Date/Time   CHOL 188 06/23/2014 0547   TRIG 157* 06/23/2014 0547   HDL 29* 06/23/2014 0547   LDLCALC 128* 06/23/2014 0547   VLDL 31 06/23/2014 0547   CHOLHDL 6.5 06/23/2014 0547    D-Dimer:  Recent Labs  06/22/14 1850  DDIMER 0.63*    CBG:  Recent Labs  06/22/14 1131  GLUCAP 105*    Hemoglobin A1C:  Recent Labs  06/22/14 1850  HGBA1C 6.1*   Fasting Lipid Panel:  Recent Labs  06/23/14 0547  CHOL 188  HDL 29*  LDLCALC 128*  TRIG 157*  CHOLHDL 6.5     Urinalysis    Component Value Date/Time   COLORURINE YELLOW 06/22/2014 1325   APPEARANCEUR CLEAR 06/22/2014 1325   LABSPEC 1.025 06/22/2014 1325   PHURINE 5.0 06/22/2014 1325   GLUCOSEU NEGATIVE 06/22/2014 1325   HGBUR NEGATIVE 06/22/2014 1325   BILIRUBINUR NEGATIVE 06/22/2014 1325   KETONESUR NEGATIVE 06/22/2014 1325   PROTEINUR NEGATIVE 06/22/2014 1325   UROBILINOGEN 0.2  06/22/2014 1325   NITRITE NEGATIVE 06/22/2014 1325   LEUKOCYTESUR NEGATIVE 06/22/2014 1325    Imaging results:  Dg Chest 2 View  06/22/2014   CLINICAL DATA:  68 year old male with syncope weakness and headaches. Initial encounter.  EXAM: CHEST  2 VIEW  COMPARISON:  None.  FINDINGS: Lung volumes at the upper limits of normal, increased AP dimension to the chest. Mild cardiomegaly. Other mediastinal contours are within normal limits. Visualized tracheal air column is within normal limits. No pneumothorax, pulmonary edema, pleural effusion or confluent pulmonary opacity. Endplate degeneration in the spine. No acute osseous abnormality identified.   IMPRESSION: No acute cardiopulmonary abnormality.   Electronically Signed   By: Lars Pinks M.D.   On: 06/22/2014 12:46   Ct Head Wo Contrast  06/22/2014   CLINICAL DATA:  Initial evaluation for sudden onset of giddiness, followed by diaphoresis, diffuse headache, no known injury  EXAM: CT HEAD WITHOUT CONTRAST  TECHNIQUE: Contiguous axial images were obtained from the base of the skull through the vertex without intravenous contrast.  COMPARISON:  None.  FINDINGS: Mild diffuse atrophy. Mild low attenuation in the deep periventricular white matter, with some degree of focality 8 in the left thalamus. Prominent cerebral spinal fluid space middle cranial fossa, anterior to the inferior aspect of the right temporal lobe and medial to the sylvian fissure.  No hydrocephalus, hemorrhage, or extra-axial fluid. No vascular territory infarct.  Calvarium intact.  IMPRESSION: 1. Chronic involutional change with evidence of white matter small vessel ischemia. Possibility of subacute left deep white matter or thalamic lacunar infarct not excluded. 2. 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 Precise etiology uncertain as could be related to prior infarct or developmental abnormality. It does not appear to be acute.   Electronically Signed   By: Skipper Cliche M.D.   On: 06/22/2014 14:19   Ct Angio Chest Pe W/cm &/or Wo Cm  06/22/2014   CLINICAL DATA:  Positive D-dimer  EXAM: CT ANGIOGRAPHY CHEST WITH CONTRAST  TECHNIQUE: Multidetector CT imaging of the chest was performed using the standard protocol during bolus administration of intravenous contrast. Multiplanar CT image reconstructions and MIPs were obtained to evaluate the vascular anatomy.  CONTRAST:  37mL OMNIPAQUE IOHEXOL 350 MG/ML SOLN  COMPARISON:  Chest x-ray from earlier in the same day.  FINDINGS: The lungs are well aerated bilaterally. Mild dependent atelectatic changes are noted in the left  upper lobe.  The thoracic aorta and its branches are within normal limits without evidence of dissection or aneurysmal dilatation. Soft atherosclerotic plaque is noted in the descending thoracic aorta appear in the pulmonary artery is well visualized and demonstrates a normal branching pattern. No filling defects are identified to suggest pulmonary emboli. No hilar or mediastinal adenopathy is seen. Mild coronary calcifications are noted.  Visualized upper abdomen shows a hypodense lesion within the left adrenal gland measuring 2.7 cm in greatest dimension. This likely represents an adenoma but is incompletely characterized on this exam. Degenerative changes of the thoracic spine are seen. No other focal bony abnormality is noted.  Review of the MIP images confirms the above findings.  IMPRESSION: No evidence of pulmonary emboli.  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  Mild dependent atelectatic changes.   Electronically Signed   By: Inez Catalina M.D.   On: 06/22/2014 22:30    Other results: EKG: Normal sinus rhythm; possible Anteroseptal infarct , age undetermined   Assessment & Plan by Problem:  1.  Episode of diaphoresis and nausea.  The etiology of this episode is unclear.  His symptoms resolved completely and have not recurred; he denies any past similar episodes.  There was no evidence of acute coronary syndrome by cardiac enzymes or EKG; 2-D echocardiogram showed mild LVH, normal systolic function with ejection fraction 50-55%, normal wall motion, and evidence of grade 2 diastolic dysfunction.  Cardiology consult was obtained, and their impression was that his symptoms were possibly vasovagal versus simply dehydration and caffeine-related symptoms; however, given lateral T-wave inversions on EKG this morning, they recommended outpatient Myoview which they have scheduled.  They also recommended outpatient carotid ultrasound to evaluate bilateral carotid bruits, which they will will  arrange.  Patient has abnormalities noted on CT scan of the head as above which radiology felt were chronic; Dr. Genene Churn reviewed these with neurology consult, who did not feel that these findings were acute or that further inpatient neurology workup was needed.   2.  Hypertension.  Improved on lisinopril 10 mg daily; patient will need outpatient follow-up for his hypertension, and was offered follow-up in our clinic.  3.  Hyperlipidemia.  Lipitor was started during this hospitalization, and patient will need follow-up for this problem and outpatient clinic.  4.  Tobacco abuse.  The importance of stopping smoking was discussed with patient.  5.  Disposition.  Discharge home today with follow-up in the cardiology office for further workup as above, and follow up in the internal medicine clinic for management of his medical issues.

## 2014-06-23 NOTE — Consult Note (Signed)
CARDIOLOGY CONSULT NOTE   Patient ID: Damon Walker MRN: [id number], DOB/AGE: 1946/06/30   Admit date: 06/22/2014 Date of Consult: 06/23/2014  Primary Physician: No primary care provider on file. Primary Cardiologist: new to  - seen by D. Ellyn Hack, MD   Pt. Profile  68 y/o male without prior cardiac hx whom we've been asked to eval secondary to presyncope and abnl ecg.  Problem List  Past Medical History  Diagnosis Date  . Untreated Hypertension   . Tobacco abuse     a. 30 yrs - 1.5 ppd.    Past Surgical History  Procedure Laterality Date  . S/p appendectomy      Age 22  . S/p inguinal hernia repair      In his 20's.     Allergies  Allergies  Allergen Reactions  . Latex Itching and Rash   HPI   68 y/o male without prior cardiac history.  He does not routinely seek medical care.  He has been smoking about 1.5 ppd over the past 30+ years.  He lives locally with his wife. He is active around the house and though he has never experienced c/p, he has noted a reduction in his exercise tolerance over the past year, stating that now he must take frequent breaks in order to complete yard work.    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  This persisted for > 10 mins and he finally go up and walked to his truck.  He says that he felt very unsteady upon getting up.  Once in his truck, he continued to feel poorly and so he decided to drive himself to the ED.  At no point did he have c/p, dyspnea, or syncope.  In the ED, he began to feel better once he was able to lie down.  He was markedly hypertensive @ 185/80.  Troponin and ECG were unremarkable.  D Dimer was mildly elevated.  CTA was neg for PE.  Head CT was abnl (see below) but not acute.  He was admitted and r/o for MI.  He's had no recurrent symptoms or events on telemetry.  F/U ECG this morning shows sinus brady with inflat twi, which was not present  yesterday morning.  We have been asked to eval.  Inpatient Medications  . aspirin EC  81 mg Oral Daily  . atorvastatin  40 mg Oral q1800  . enoxaparin (LOVENOX) injection  40 mg Subcutaneous Q24H  . lisinopril  10 mg Oral Daily  . sodium chloride  3 mL Intravenous Q12H   Family History Family History  Problem Relation Age of Onset  . Other      Adopted - unaware of biological parent's histories.     Social History History   Social History  . Marital Status: Married    Spouse Name: N/A    Number of Children: N/A  . Years of Education: N/A   Occupational History  . Not on file.   Social History Main Topics  . Smoking status: Current Every Day Smoker -- 1.50 packs/day for 30 years    Types: Cigarettes  . Smokeless tobacco: Not on file  . Alcohol Use: No  . Drug Use: No  . Sexual Activity: Not on file   Other Topics Concern  . Not on file   Social History Narrative   Lives in [place] with wife. Originally from [place].    Review of Systems  General:  As above, +++ malaise/diaphoresis yesterday.  No chills, fever, night sweats or weight changes.  Cardiovascular:  No chest pain.  As above, he does experience dyspnea on exertion @ home.  Presyncope yesterday.  No edema, orthopnea, palpitations, paroxysmal nocturnal dyspnea. Dermatological: No rash, lesions/masses Respiratory: No cough, +++ dyspnea Urologic: No hematuria, dysuria Abdominal:   +++ nausea (yesterday - resolved), no vomiting, diarrhea, bright red blood per rectum, melena, or hematemesis Neurologic:  No visual changes, wkns, changes in mental status. All other systems reviewed and are otherwise negative except as noted above.  Physical Exam  Blood pressure 134/54, pulse 52, temperature 98.8 F (37.1 C), temperature source Oral, resp. rate 18, height 5\' 8"  (1.727 m), weight 200 lb (90.719 kg), SpO2 97 %.  General: Pleasant, NAD Psych: Normal affect. Neuro: Alert and oriented X 3. Moves all extremities  spontaneously. HEENT: Normal  Neck: Supple without JVD.  Soft bilat, L>R bruits. Lungs:  Resp regular and unlabored, diminished breath sounds throughout. Heart: RRR no s3, s4.  Soft syst murmur @ bilat USB (vs bruits). Abdomen: Soft, non-tender, non-distended, BS + x 4.  Extremities: No clubbing, cyanosis or edema. DP/PT/Radials 2+ and equal bilaterally.  Labs   Recent Labs  06/22/14 1850 06/22/14 2224 06/23/14 0547  TROPONINI <0.30 <0.30 <0.30   Lab Results  Component Value Date   WBC 9.4 06/23/2014   HGB 13.0 06/23/2014   HCT 39.8 06/23/2014   MCV 92.1 06/23/2014   PLT 286 06/23/2014     Recent Labs Lab 06/22/14 1135 06/23/14 0547  NA 139 141  K 4.1 4.8  CL 102 105  CO2 22 26  BUN 15 12  CREATININE 0.95 0.94  CALCIUM 9.4 8.7  PROT 7.7  --   BILITOT 0.3  --   ALKPHOS 64  --   ALT 20  --   AST 19  --   GLUCOSE 110* 94   Lab Results  Component Value Date   CHOL 188 06/23/2014   HDL 29* 06/23/2014   LDLCALC 128* 06/23/2014   TRIG 157* 06/23/2014   Lab Results  Component Value Date   DDIMER 0.63* 06/22/2014   Radiology/Studies  Dg Chest 2 View  06/22/2014   CLINICAL DATA:  68 year old male with syncope weakness and headaches. Initial encounter.  EXAM: CHEST  2 VIEW IMPRESSION: No acute cardiopulmonary abnormality.   Electronically Signed   By: Lars Pinks M.D.   On: 06/22/2014 12:46   Ct Head Wo Contrast  06/22/2014   CLINICAL DATA:  Initial evaluation for sudden onset of giddiness, followed by diaphoresis, diffuse headache, no known injury  EXAM: CT HEAD WITHOUT CONTRAST IMPRESSION: 1. Chronic involutional change with evidence of white matter small vessel ischemia. Possibility of subacute left deep white matter or thalamic lacunar infarct not excluded. 2. 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 Precise etiology uncertain as could be related to prior infarct or developmental abnormality.  It does not appear to be acute.   Electronically Signed   By: Skipper Cliche M.D.   On: 06/22/2014 14:19   Ct Angio Chest Pe W/cm &/or Wo Cm  06/22/2014   CLINICAL DATA:  Positive D-dimer  EXAM: CT ANGIOGRAPHY CHEST WITH CONTRAST   IMPRESSION: No evidence of pulmonary emboli.  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  Mild dependent atelectatic changes.   Electronically Signed   By: Inez Catalina M.D.   On: 06/22/2014 22:30   ECG  Sinus brady, 53, inflat twi - new compared to admission.  ASSESSMENT AND PLAN  1.  Presyncope:  Event yesterday sounds to be vasovagal on the surface, though he was hypertensive and normocardic on initial presentation in the ER.  Dehydration is also a possibility given high caffeine and otherwise poor oral fluid intake.  Hypertensive since admission w/o recurrent symptoms or events on tele.  Echo performed - we will review and consider outpt monitoring.  2.  Abnl ECG:  ECG this AM shows inflat TWI, which was not present on ECG in the ED yesterday.  He does not have a prior h/o chest pain but does report a reduction in exercise tolerance and DOE over the past year.  Other risk factors include untreated HTN, ongoing tobacco abuse, and carotid bruits. Provided that echo shows nl LV function, we will arrange for an outpatient lexiscan myoview to r/o ischemia.  Cont asa/statin.  3.  HTN:  Started on lisinopril.  BP improved.  4.  HL:  LDL 128.  10 yr risk of 65.8%.  Cont statin therapy.  5.  L>R carotid bruits:  We will arrange for outpt carotid u/s.  6.  Tob Abuse:  Cessation advised.  Signed, Murray Hodgkins, NP 06/23/2014, 12:35 PM

## 2014-06-23 NOTE — Progress Notes (Signed)
Discharge orders received.  Discharge instructions and follow-up appointments reviewed with the patient and his wife.  VSS upon discharge.  IV removed and education complete.  Transported out via wheelchair wife present. Cori Razor, RN

## 2014-06-23 NOTE — Progress Notes (Signed)
UR completed 

## 2014-06-23 NOTE — Progress Notes (Signed)
Subjective:  Patient reports that he had one episode of acute R sided sharp chest pain overnight, but has not had any pain since that time. He also complains of feeling clammy and diaphoretic this morning. Finally, when asked about nausea, he endorses some "abdominal discomfort, something just doesn't feel right". He denies any shob, dizziness, HA, palpitations, vomiting or diarrhea.   Objective: Vital signs in last 24 hours: Filed Vitals:   06/22/14 2148 06/23/14 0158 06/23/14 0506 06/23/14 0909  BP: 133/56 127/65 160/57 134/54  Pulse: 57 65 52 52  Temp: 98.1 F (36.7 C) 98.7 F (37.1 C) 98.3 F (36.8 C) 98.8 F (37.1 C)  TempSrc: Oral Oral Oral Oral  Resp: 18 18 18 18   Height:      Weight:      SpO2: 94% 95% 96% 97%    Intake/Output Summary (Last 24 hours) at 06/23/14 0911 Last data filed at 06/23/14 0507  Gross per 24 hour  Intake    320 ml  Output    400 ml  Net    -80 ml   BP 134/54 mmHg  Pulse 52  Temp(Src) 98.8 F (37.1 C) (Oral)  Resp 18  Ht 5\' 8"  (1.727 m)  Wt 90.719 kg (200 lb)  BMI 30.42 kg/m2  SpO2 97%   Vital signs reviewed and are stable General appearance: alert, cooperative and no distress Lungs: clear to auscultation bilaterally Heart: regular rate and rhythm, S1, S2 normal, no murmur, click, rub or gallop. Patient had no orthostatic hypotension Abdomen: soft, non-tender; bowel sounds normal; no masses,  no organomegaly Extremities: extremities normal, atraumatic, no cyanosis or edema Skin: skin clammy and cool to touch Neurologic: Alert and oriented X 3, normal strength and tone. Normal symmetric reflexes. Normal coordination and gait   Lab Results: Basic Metabolic Panel:  Recent Labs Lab 06/22/14 1135 06/23/14 0547  NA 139 141  K 4.1 4.8  CL 102 105  CO2 22 26  GLUCOSE 110* 94  BUN 15 12  CREATININE 0.95 0.94  CALCIUM 9.4 8.7   Liver Function Tests:  Recent Labs Lab 06/22/14 1135  AST 19  ALT 20  ALKPHOS 64  BILITOT 0.3    PROT 7.7  ALBUMIN 3.8   CBC:  Recent Labs Lab 06/22/14 1135 06/23/14 0547  WBC 10.7* 9.4  HGB 14.6 13.0  HCT 43.1 39.8  MCV 90.9 92.1  PLT 313 286   Cardiac Enzymes:  Recent Labs Lab 06/22/14 1850 06/22/14 2224 06/23/14 0547  TROPONINI <0.30 <0.30 <0.30   D-Dimer:  Recent Labs Lab 06/22/14 1850  DDIMER 0.63*   CBG:  Recent Labs Lab 06/22/14 1131  GLUCAP 105*   Hemoglobin A1C:  Recent Labs Lab 06/22/14 1850  HGBA1C 6.1*   Fasting Lipid Panel:  Recent Labs Lab 06/23/14 0547  CHOL 188  HDL 29*  LDLCALC 128*  TRIG 157*  CHOLHDL 6.5   Urinalysis:  Recent Labs Lab 06/22/14 1325  COLORURINE YELLOW  LABSPEC 1.025  PHURINE 5.0  GLUCOSEU NEGATIVE  HGBUR NEGATIVE  BILIRUBINUR NEGATIVE  KETONESUR NEGATIVE  PROTEINUR NEGATIVE  UROBILINOGEN 0.2  NITRITE NEGATIVE  LEUKOCYTESUR NEGATIVE   Studies/Results: Dg Chest 2 View  06/22/2014   CLINICAL DATA:  68 year old male with syncope weakness and headaches. Initial encounter.  EXAM: CHEST  2 VIEW  COMPARISON:  None.  FINDINGS: Lung volumes at the upper limits of normal, increased AP dimension to the chest. Mild cardiomegaly. Other mediastinal contours are within normal limits. Visualized tracheal air column  is within normal limits. No pneumothorax, pulmonary edema, pleural effusion or confluent pulmonary opacity. Endplate degeneration in the spine. No acute osseous abnormality identified.  IMPRESSION: No acute cardiopulmonary abnormality.   Electronically Signed   By: Lars Pinks M.D.   On: 06/22/2014 12:46   Ct Head Wo Contrast  06/22/2014   CLINICAL DATA:  Initial evaluation for sudden onset of giddiness, followed by diaphoresis, diffuse headache, no known injury  EXAM: CT HEAD WITHOUT CONTRAST  TECHNIQUE: Contiguous axial images were obtained from the base of the skull through the vertex without intravenous contrast.  COMPARISON:  None.  FINDINGS: Mild diffuse atrophy. Mild low attenuation in the deep  periventricular white matter, with some degree of focality 8 in the left thalamus. Prominent cerebral spinal fluid space middle cranial fossa, anterior to the inferior aspect of the right temporal lobe and medial to the sylvian fissure.  No hydrocephalus, hemorrhage, or extra-axial fluid. No vascular territory infarct.  Calvarium intact.  IMPRESSION: 1. Chronic involutional change with evidence of white matter small vessel ischemia. Possibility of subacute left deep white matter or thalamic lacunar infarct not excluded. 2. 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 Precise etiology uncertain as could be related to prior infarct or developmental abnormality. It does not appear to be acute.   Electronically Signed   By: Skipper Cliche M.D.   On: 06/22/2014 14:19   Ct Angio Chest Pe W/cm &/or Wo Cm  06/22/2014   CLINICAL DATA:  Positive D-dimer  EXAM: CT ANGIOGRAPHY CHEST WITH CONTRAST  TECHNIQUE: Multidetector CT imaging of the chest was performed using the standard protocol during bolus administration of intravenous contrast. Multiplanar CT image reconstructions and MIPs were obtained to evaluate the vascular anatomy.  CONTRAST:  61mL OMNIPAQUE IOHEXOL 350 MG/ML SOLN  COMPARISON:  Chest x-ray from earlier in the same day.  FINDINGS: The lungs are well aerated bilaterally. Mild dependent atelectatic changes are noted in the left upper lobe.  The thoracic aorta and its branches are within normal limits without evidence of dissection or aneurysmal dilatation. Soft atherosclerotic plaque is noted in the descending thoracic aorta appear in the pulmonary artery is well visualized and demonstrates a normal branching pattern. No filling defects are identified to suggest pulmonary emboli. No hilar or mediastinal adenopathy is seen. Mild coronary calcifications are noted.  Visualized upper abdomen shows a hypodense lesion within the left adrenal gland  measuring 2.7 cm in greatest dimension. This likely represents an adenoma but is incompletely characterized on this exam. Degenerative changes of the thoracic spine are seen. No other focal bony abnormality is noted.  Review of the MIP images confirms the above findings.  IMPRESSION: No evidence of pulmonary emboli.  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  Mild dependent atelectatic changes.   Electronically Signed   By: Inez Catalina M.D.   On: 06/22/2014 22:30   EKG: Sinus bradycardia, 53. New TWI not present on yesterday's EKG in leads II, III, aVR, aVF, V6  2D Echocardiogram Pending   Medications: I have reviewed the patient's current medications.  Scheduled Meds: . enoxaparin (LOVENOX) injection  40 mg Subcutaneous Q24H  . lisinopril  10 mg Oral Daily  . sodium chloride  3 mL Intravenous Q12H   Continuous Infusions: . sodium chloride 75 mL/hr at 06/23/14 0802   Assessment/Plan: Active Problems:   Near syncope   Pre-syncope  68 y.o. M with no PMHx and no medical care for 15+ years  on HD 1 who self-presented to the ED yesterday after acute onset diaphoresis, HA, dizziness, and nausea.  1.) Presyncope: Event sounded vasovagal, however, he was hypertensive and normocardic on admission. He has been hypertensive since admission, but no arrhythmias have been noted.    - Cards consult ordered   - 2D Echocardiogram performed, results pending   - Outpatient Carotid U/S  2.) Abnormal EKG: EKG this morning shows new TWI that were not present on the EKG yesterday. Patient denies any symptoms of ACS, but mentions an overall decrease in exercise tolerance and DOE over the past year. He has other RFs, including: age, untreated HTN, tobacco abuse.   - Continue ASA   - Continue statin   - Cards considering lexiscan myoview if Echo is normal  3.) HTN: BP improved since lisinopril began   - Continue lisinopril 10mg    - TSH ordered  4.) HLD: LDL 128, 10 yr risk 65.8%   - Continue  statin  5.)Tobacco abuse   - Patient advised about the benefits of smoking cessation and options available for him   Dispo: Disposition is deferred at this time, awaiting improvement of current medical problems.  Anticipated discharge in approximately 1-2 day(s).   The patient does not have a current PCP (No primary care provider on file.) and does need an [facility] hospital follow-up appointment after discharge.  The patient does not have transportation limitations that hinder transportation to clinic appointments.  .Services Needed at time of discharge: Y = Yes, Blank = No PT:   OT:   RN:   Equipment:   Other:     LOS: 1 day   Farrell Ours, Med Student 06/23/2014, 9:11 AM

## 2014-06-28 ENCOUNTER — Ambulatory Visit (HOSPITAL_COMMUNITY): Payer: Medicare Other | Attending: Internal Medicine | Admitting: Cardiology

## 2014-06-28 ENCOUNTER — Ambulatory Visit (HOSPITAL_BASED_OUTPATIENT_CLINIC_OR_DEPARTMENT_OTHER): Payer: Medicare Other | Admitting: Radiology

## 2014-06-28 DIAGNOSIS — R55 Syncope and collapse: Secondary | ICD-10-CM | POA: Insufficient documentation

## 2014-06-28 DIAGNOSIS — I6523 Occlusion and stenosis of bilateral carotid arteries: Secondary | ICD-10-CM

## 2014-06-28 DIAGNOSIS — R0989 Other specified symptoms and signs involving the circulatory and respiratory systems: Secondary | ICD-10-CM

## 2014-06-28 DIAGNOSIS — I10 Essential (primary) hypertension: Secondary | ICD-10-CM | POA: Diagnosis not present

## 2014-06-28 DIAGNOSIS — R42 Dizziness and giddiness: Secondary | ICD-10-CM

## 2014-06-28 MED ORDER — REGADENOSON 0.4 MG/5ML IV SOLN
0.4000 mg | Freq: Once | INTRAVENOUS | Status: AC
  Administered 2014-06-28: 0.4 mg via INTRAVENOUS

## 2014-06-28 MED ORDER — TECHNETIUM TC 99M SESTAMIBI GENERIC - CARDIOLITE
30.0000 | Freq: Once | INTRAVENOUS | Status: AC | PRN
  Administered 2014-06-28: 30 via INTRAVENOUS

## 2014-06-28 MED ORDER — TECHNETIUM TC 99M SESTAMIBI GENERIC - CARDIOLITE
10.0000 | Freq: Once | INTRAVENOUS | Status: AC | PRN
  Administered 2014-06-28: 10 via INTRAVENOUS

## 2014-06-28 NOTE — Progress Notes (Signed)
[facility] [address] [place], [place] [address] [phone]    Cardiology Nuclear Med Study  Damon Walker is a 68 y.o. male     MRN : [id number]     DOB: 03/06/1946  Procedure Date: 06/28/2014  Nuclear Med Background Indication for Stress Test:  Evaluation for Ischemia History:  No known CAD Cardiac Risk Factors: Hypertension  Symptoms:  Near Syncope   Nuclear Pre-Procedure Caffeine/Decaff Intake:  None> 12 hrs NPO After: 10:00pm   Lungs:  clear O2 Sat: 95% on room air. IV 0.9% NS with Angio Cath:  22g  IV Site: R Antecubital x 1, tolerated well IV Started by:  Irven Baltimore, RN  Chest Size (in):  42 Cup Size: n/a  Height: 5\' 8"  (1.727 m)  Weight:  195 lb (88.451 kg)  BMI:  Body mass index is 29.66 kg/(m^2). Tech Comments:  Patient took Lisinopril this am. Irven Baltimore, RN.    Nuclear Med Study 1 or 2 day study: 1 day  Stress Test Type:  Carlton Adam  Reading MD: N/A  Order Authorizing Provider:  Bertha Stakes, MD, and Murray Hodgkins, NP, and Dr. Glenetta Hew  Resting Radionuclide: Technetium 74m Sestamibi  Resting Radionuclide Dose: 11.0 mCi   Stress Radionuclide:  Technetium 65m Sestamibi  Stress Radionuclide Dose: 33.0 mCi           Stress Protocol Rest HR: 52 Stress HR: 86  Rest BP: 157/65 Stress BP: 142/76  Exercise Time (min): n/a METS: n/a   Predicted Max HR: 150 bpm % Max HR: 57.33 bpm Rate Pressure Product: 12900   Dose of Adenosine (mg):  n/a Dose of Lexiscan: 0.4 mg  Dose of Atropine (mg): n/a Dose of Dobutamine: n/a mcg/kg/min (at max HR)  Stress Test Technologist: Glade Lloyd, BS-ES  Nuclear Technologist:  Earl Many, CNMT     Rest Procedure:  Myocardial perfusion imaging was performed at rest 45 minutes following the intravenous administration of Technetium 45m Sestamibi. Rest ECG: SB 52 with non specific ST changes.   Stress Procedure:  The patient received IV Lexiscan 0.4 mg over 15-seconds.   Technetium 46m Sestamibi injected at 30-seconds.  Quantitative spect images were obtained after a 45 minute delay.  During the infusion of Lexiscan the patient complained of stomach upset, SOB and fuzzy head.  These symptoms began to resolve in recovery.  Stress ECG: SB 52bpm with non specific ST changes. No change with stress.  QPS Raw Data Images:  Mild diaphragmatic attenuation.  Normal left ventricular size. Stress Images:  There is a modereate intensity moderate in size inferior wall defect seen at both rest and stress with no reversibility. Likely diaphragmatic attenuation.  Rest Images:  As above. Otherwise homogeneous radiotracer uptake.  Subtraction (SDS):  No evidence of ischemia. Transient Ischemic Dilatation (Normal <1.22):  1.11 Lung/Heart Ratio (Normal <0.45):  0.35  Quantitative Gated Spect Images QGS EDV:  152 ml QGS ESV:  85 ml  Impression Exercise Capacity:  Lexiscan with no exercise. BP Response:  Normal blood pressure response. Clinical Symptoms:  Typical symptoms with Lexiscan ECG Impression:  No significant ST segment change suggestive of ischemia. Comparison with Prior Nuclear Study: No images to compare  Overall Impression:  Intermediate risk stress nuclear study secondary to decreased EF of 44%. Inferior wall fixed defect likely secondary to diaphragmatic attenuation. . No ischemia identified.   LV Ejection Fraction: 44%.  LV Wall Motion:  Generalized reduced systolic function.    Candee Furbish, MD

## 2014-06-28 NOTE — Progress Notes (Signed)
Carotid duplex performed 

## 2014-06-29 ENCOUNTER — Telehealth: Payer: Self-pay | Admitting: *Deleted

## 2014-06-29 NOTE — Telephone Encounter (Signed)
LEFT MESSAGE TO CALL BACK ON 07/04/14

## 2014-06-29 NOTE — Telephone Encounter (Signed)
-----   Message from Leonie Man, MD sent at 06/29/2014  4:45 PM EST ----- Abnormal Nuclear Stress test - shows an area of possible prior MI, but no evidence of Ischemia.  It estimates the pump function to be a bit lower than the Echocardiogram (they tend to underestimate vs. Echo).  Because there was an unexpected "defect" that could potentially be scar from a prior heart attack, the test is read as INTERMEDIATE RISK -- we can review this together in your follow-up appointment.  Depending on how you are doing, we may need to consider additional testing.  We need to ensure that you have a f/u appt with either myself or Mr. Riki Altes.  Leonie Man, MD

## 2014-07-04 ENCOUNTER — Telehealth: Payer: Self-pay | Admitting: *Deleted

## 2014-07-04 NOTE — Telephone Encounter (Signed)
Will call tomorrow 07/05/14

## 2014-07-05 ENCOUNTER — Ambulatory Visit (INDEPENDENT_AMBULATORY_CARE_PROVIDER_SITE_OTHER): Payer: Medicare Other | Admitting: Internal Medicine

## 2014-07-05 ENCOUNTER — Encounter: Payer: Self-pay | Admitting: Internal Medicine

## 2014-07-05 VITALS — BP 130/58 | HR 68 | Temp 98.1°F | Ht 68.5 in | Wt 201.3 lb

## 2014-07-05 DIAGNOSIS — I10 Essential (primary) hypertension: Secondary | ICD-10-CM

## 2014-07-05 DIAGNOSIS — Z87891 Personal history of nicotine dependence: Secondary | ICD-10-CM | POA: Insufficient documentation

## 2014-07-05 DIAGNOSIS — E785 Hyperlipidemia, unspecified: Secondary | ICD-10-CM | POA: Insufficient documentation

## 2014-07-05 DIAGNOSIS — R55 Syncope and collapse: Secondary | ICD-10-CM

## 2014-07-05 DIAGNOSIS — Z720 Tobacco use: Secondary | ICD-10-CM

## 2014-07-05 NOTE — Progress Notes (Signed)
Internal Medicine Clinic Attending  Case discussed with Dr. Glenn soon after the resident saw the patient.  We reviewed the resident's history and exam and pertinent patient test results.  I agree with the assessment, diagnosis, and plan of care documented in the resident's note. 

## 2014-07-05 NOTE — Patient Instructions (Signed)
**  Be sure to keep you follow up appt with Cardiology on 07/11/14 **We will call you with the Neurology appointment **We will see you in 3 months  General Instructions:   Please bring your medicines with you each time you come to clinic.  Medicines may include prescription medications, over-the-counter medications, herbal remedies, eye drops, vitamins, or other pills.

## 2014-07-05 NOTE — Assessment & Plan Note (Signed)
Pt was smoking 1-2ppd but has not smoked since his hospitalization 11/18. Continued cessation was encouraged.

## 2014-07-05 NOTE — Assessment & Plan Note (Addendum)
No further episodes of nausea or diaphoresis. Pt denies chest pain or SOB. 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 CT head with chronic changes. - Pt with Cardiology appt 12/7.  - Neurology referral placed due to CT head findings - Pt to continue statin, ACEi, and ASA for risk stratification - Continued smoking cessation encouraged.

## 2014-07-05 NOTE — Assessment & Plan Note (Signed)
LDL 128, HDL 29, total cholesterol 188. Pt continues to take his Atorvastatin 40mg  daily.

## 2014-07-05 NOTE — Assessment & Plan Note (Signed)
BP Readings from Last 3 Encounters:  07/05/14 130/58  06/28/14 157/65  06/23/14 147/50    Lab Results  Component Value Date   NA 141 06/23/2014   K 4.8 06/23/2014   CREATININE 0.94 06/23/2014    Assessment: Blood pressure control: controlled Progress toward BP goal:  at goal Comments: Pt taking Lisinopril 10mg  po daily  Plan: Medications:  continue current medications Educational resources provided:   Self management tools provided:   Other plans: F/u in 3 mo

## 2014-07-05 NOTE — Telephone Encounter (Signed)
Returning your call. °

## 2014-07-05 NOTE — Telephone Encounter (Signed)
Patient is aware of upcoming appointment on 07/11/14. Patient sates he does not have any symptoms at present time. Patient is aware to keep appointment.

## 2014-07-05 NOTE — Progress Notes (Signed)
Patient ID: Damon Walker, male   DOB: 07-25-1946, 68 y.o.   MRN: [id number]  Subjective:   Patient ID: Damon Walker male   DOB: [birth year] 68 y.o.   MRN: [id number]  HPI: Mr.Damon Walker is a 68 y.o. M w/ PMH HTN, HLD, and recent hospitalization for pre-syncopal episode presents to the clinic as a new patient.  He was admitted to [facility] 11/18 after experiencing an episode of diaphoresis and nausea. CT w/o acute changes but did show chronic abnormalities and Neurology recommended outpatient follow up. T wave inversions were noted on EKG and Cardiology was consulted. They noted a carotid bruit and recommended carotid duplex and a Lexiscan, which were done as an outpatient. The carotid duplex revealed 40-59% bilat ICA stenosis and Cardiology recommended for the patient to continue asa/statin and f/u in 1 yr. Nuclear stress test revealed a possible previous MI but no evidence of ischemia, and labled him as intermediate risk. He has an appt with Cardiology on 12/7.  Since his hospital discharge, he has been doing well, denies any further episodes of nausea or diaphoresis. He denies chest pain or SOB and endorses compliance with his medications.    Past Medical History  Diagnosis Date  . Untreated Hypertension   . Tobacco abuse     a. 30 yrs - 1.5 ppd.   Current Outpatient Prescriptions  Medication Sig Dispense Refill  . aspirin EC 81 MG EC tablet Take 1 tablet (81 mg total) by mouth daily. 90 tablet 3  . atorvastatin (LIPITOR) 40 MG tablet Take 1 tablet (40 mg total) by mouth daily at 6 PM. 90 tablet 3  . lisinopril (PRINIVIL,ZESTRIL) 10 MG tablet Take 1 tablet (10 mg total) by mouth daily. 90 tablet 3   No current facility-administered medications for this visit.   Family History  Problem Relation Age of Onset  . Other      Adopted - unaware of biological parent's histories.   History   Social History  . Marital Status: Married    Spouse Name: N/A    Number of Children: N/A  .  Years of Education: N/A   Social History Main Topics  . Smoking status: Former Smoker -- 1.50 packs/day for 30 years    Types: Cigarettes    Quit date: 07/05/2014  . Smokeless tobacco: None  . Alcohol Use: No  . Drug Use: No  . Sexual Activity: None   Other Topics Concern  . None   Social History Narrative   Lives in [place] with wife. Originally from [place].   Review of Systems: Constitutional: Denies fever, chills, diaphoresis, or fatigue.  HEENT: Denies vision changes Respiratory: Denies SOB, chest tightness. +DOE   Cardiovascular: Denies chest pain, palpitations and leg swelling.  Gastrointestinal: Denies nausea, vomiting, abdominal pain, diarrhea, constipation Genitourinary: Denies dysuria, urgency, frequency Musculoskeletal: Denies myalgias, back pain, joint swelling, arthralgias and gait problem.  Skin: +psoriasis on hands Neurological: Denies dizziness, syncope, weakness, light-headedness, numbness, or headaches.  Psychiatric/Behavioral: Denies suicidal ideation, mood changes, confusion  Objective:  Physical Exam: Filed Vitals:   07/05/14 1037  BP: 175/64  Pulse: 77  Temp: 98.1 F (36.7 C)  TempSrc: Oral  Height: 5' 8.5" (1.74 m)  Weight: 201 lb 4.8 oz (91.309 kg)  SpO2: 98%   Constitutional: Vital signs reviewed.  Patient is a well-developed and well-nourished male in no acute distress and cooperative with exam. Alert and oriented x3.  Head: Normocephalic and atraumatic Mouth: no erythema or exudates, MMM Eyes: PERRL, EOMI. No  scleral icterus.  Neck: Supple, Trachea midline, normal ROM, No JVD, mass, thyromegaly, + L carotid bruit present.  Cardiovascular: RRR, no MRG, pulses symmetric and intact bilaterally Pulmonary/Chest: normal respiratory effort, CTAB, no wheezes, rales, or rhonchi Abdominal: Soft. Non-tender, non-distended, bowel sounds are normal GU: no CVA tenderness Musculoskeletal: +stiffness at the hips bilaterally, ROM full  Hematology: No  cervical adenopathy.  Neurological: A&O x3, Strength is normal and symmetric bilaterally, cranial nerve II-XII are grossly intact, no focal motor deficit, sensory intact to light touch bilaterally.  Skin: Warm, dry and intact. +rash on palms of hands.  Psychiatric: Normal mood and affect. speech and behavior is normal. Judgment and thought content normal.   Assessment & Plan:   Please refer to Problem List based Assessment and Plan

## 2014-07-11 ENCOUNTER — Encounter: Payer: Self-pay | Admitting: Cardiology

## 2014-07-11 ENCOUNTER — Ambulatory Visit (INDEPENDENT_AMBULATORY_CARE_PROVIDER_SITE_OTHER): Payer: Medicare Other | Admitting: Cardiology

## 2014-07-11 VITALS — BP 128/76 | HR 67 | Ht 68.0 in | Wt 199.1 lb

## 2014-07-11 DIAGNOSIS — I10 Essential (primary) hypertension: Secondary | ICD-10-CM

## 2014-07-11 NOTE — Progress Notes (Signed)
07/11/2014 Larna Daughters   October 15, 1945  [id number]  Primary Physician No PCP Per Patient Primary Cardiologist: Dr. Ellyn Hack  HPI:  [name] presents to clinic today for post hospital f/u. He is a 68 y/o male with no prior cardiac history. He was seen recently in the [facility] ED for evaluation for a sudden episode of diaphoresis, lightheadedness, and nausea.  In the ED, he began to feel better once he was able to lie down. He was markedly hypertensive @ 185/80. Troponin and ECG were unremarkable. D Dimer was mildly elevated. CTA was neg for PE. Head CT was abnl but not acute. He was admitted and r/o for MI. He had no recurrent symptoms or events on telemetry. He was discharge home on lisinopril for BP and placed on a statin. He was instructed to f/u for an OP stress test. This was performed 06/29/14. This demonstrated an area of possible prior MI, but no evidence of Ischemia. It was interpreted as an Intermediate risk NST.   Today in clinic, he denies any recurrent episodes of syncope/near syncope. He denies exertional chest pain. He engages in yard work without limitations. No exertional dyspnea, orthopnea, PND or LEE. He reports full medication compliance. BP is better controlled at 128/76.    Current Outpatient Prescriptions  Medication Sig Dispense Refill  . aspirin EC 81 MG EC tablet Take 1 tablet (81 mg total) by mouth daily. 90 tablet 3  . atorvastatin (LIPITOR) 40 MG tablet Take 1 tablet (40 mg total) by mouth daily at 6 PM. 90 tablet 3  . lisinopril (PRINIVIL,ZESTRIL) 10 MG tablet Take 1 tablet (10 mg total) by mouth daily. 90 tablet 3   No current facility-administered medications for this visit.    Allergies  Allergen Reactions  . Latex Itching and Rash    History   Social History  . Marital Status: Married    Spouse Name: N/A    Number of Children: N/A  . Years of Education: N/A   Occupational History  . Not on file.   Social History Main Topics  . Smoking  status: Former Smoker -- 1.50 packs/day for 30 years    Types: Cigarettes    Quit date: 07/05/2014  . Smokeless tobacco: Not on file  . Alcohol Use: No  . Drug Use: No  . Sexual Activity: Not on file   Other Topics Concern  . Not on file   Social History Narrative   Lives in [place] with wife. Originally from [place].     Review of Systems: General: negative for chills, fever, night sweats or weight changes.  Cardiovascular: negative for chest pain, dyspnea on exertion, edema, orthopnea, palpitations, paroxysmal nocturnal dyspnea or shortness of breath Dermatological: negative for rash Respiratory: negative for cough or wheezing Urologic: negative for hematuria Abdominal: negative for nausea, vomiting, diarrhea, bright red blood per rectum, melena, or hematemesis Neurologic: negative for visual changes, syncope, or dizziness All other systems reviewed and are otherwise negative except as noted above.    Blood pressure 128/76, pulse 67, height 5\' 8"  (1.727 m), weight 199 lb 1.6 oz (90.311 kg).  General appearance: alert, cooperative and no distress Neck: no JVD Lungs: clear to auscultation bilaterally Heart: regular rate and rhythm, S1, S2 normal, no murmur, click, rub or gallop Extremities: no LEE Pulses: 2+ and symmetric Skin: warm and dry Neurologic: Grossly normal  EKG NSR; 67 bpm. No ischemic changes.   ASSESSMENT AND PLAN:   1. Intermediate risk NST: Discussed with Dr. Ellyn Hack  who reviewed NST results. "Defect" seen on images felt to be likely attenuation. No chest pain. No dyspnea. No further w/u indicated at this time.   2. HTN: well controlled on ACE-I. BP 128/76 today. Continue 10 mg of Lisinopril daily.   3. HLD: Continue statin therapy. Lipid profile will be followed by PCP.   4. Tobacco abuse: patient has discontinued use. He was congratulated on his efforts.    PLAN  No change in medical therapy. No further cardiac w/u indicated at this time. F/U as needed.     SIMMONS, [place] 07/11/2014 8:37 AM

## 2014-07-11 NOTE — Patient Instructions (Signed)
Your physician recommends that you schedule a follow-up appointment As Needed  

## 2014-08-10 ENCOUNTER — Encounter: Payer: Self-pay | Admitting: *Deleted

## 2014-09-14 NOTE — Addendum Note (Signed)
Addended by: Hulan Fray on: 09/14/2014 07:01 PM   Modules accepted: Orders

## 2014-10-06 ENCOUNTER — Encounter: Payer: Self-pay | Admitting: Internal Medicine

## 2014-12-14 ENCOUNTER — Telehealth: Payer: Self-pay | Admitting: Cardiology

## 2014-12-14 ENCOUNTER — Emergency Department (HOSPITAL_COMMUNITY)
Admission: EM | Admit: 2014-12-14 | Discharge: 2014-12-14 | Disposition: A | Discharge status: Home / Self Care | Payer: Medicare Other | Attending: Emergency Medicine | Admitting: Emergency Medicine

## 2014-12-14 ENCOUNTER — Emergency Department (HOSPITAL_COMMUNITY): Payer: Medicare Other

## 2014-12-14 ENCOUNTER — Other Ambulatory Visit: Payer: Self-pay

## 2014-12-14 ENCOUNTER — Encounter (HOSPITAL_COMMUNITY): Payer: Self-pay | Admitting: General Practice

## 2014-12-14 DIAGNOSIS — Z87891 Personal history of nicotine dependence: Secondary | ICD-10-CM | POA: Insufficient documentation

## 2014-12-14 DIAGNOSIS — Z91040 Latex allergy status: Secondary | ICD-10-CM | POA: Insufficient documentation

## 2014-12-14 DIAGNOSIS — I10 Essential (primary) hypertension: Secondary | ICD-10-CM | POA: Insufficient documentation

## 2014-12-14 DIAGNOSIS — R9431 Abnormal electrocardiogram [ECG] [EKG]: Secondary | ICD-10-CM | POA: Diagnosis present

## 2014-12-14 DIAGNOSIS — E785 Hyperlipidemia, unspecified: Secondary | ICD-10-CM | POA: Insufficient documentation

## 2014-12-14 DIAGNOSIS — Z7982 Long term (current) use of aspirin: Secondary | ICD-10-CM | POA: Insufficient documentation

## 2014-12-14 DIAGNOSIS — R079 Chest pain, unspecified: Secondary | ICD-10-CM | POA: Insufficient documentation

## 2014-12-14 DIAGNOSIS — Z79899 Other long term (current) drug therapy: Secondary | ICD-10-CM | POA: Diagnosis not present

## 2014-12-14 LAB — CBC WITH DIFFERENTIAL/PLATELET
Basophils Absolute: 0.1 10*3/uL (ref 0.0–0.1)
Basophils Relative: 1 % (ref 0–1)
EOS ABS: 0.5 10*3/uL (ref 0.0–0.7)
Eosinophils Relative: 5 % (ref 0–5)
HCT: 41.1 % (ref 39.0–52.0)
Hemoglobin: 13.6 g/dL (ref 13.0–17.0)
LYMPHS ABS: 3.1 10*3/uL (ref 0.7–4.0)
Lymphocytes Relative: 29 % (ref 12–46)
MCH: 30.2 pg (ref 26.0–34.0)
MCHC: 33.1 g/dL (ref 30.0–36.0)
MCV: 91.3 fL (ref 78.0–100.0)
Monocytes Absolute: 0.9 10*3/uL (ref 0.1–1.0)
Monocytes Relative: 8 % (ref 3–12)
NEUTROS ABS: 6.4 10*3/uL (ref 1.7–7.7)
NEUTROS PCT: 57 % (ref 43–77)
Platelets: 272 10*3/uL (ref 150–400)
RBC: 4.5 MIL/uL (ref 4.22–5.81)
RDW: 13.2 % (ref 11.5–15.5)
WBC: 10.9 10*3/uL — ABNORMAL HIGH (ref 4.0–10.5)

## 2014-12-14 LAB — BASIC METABOLIC PANEL
Anion gap: 11 (ref 5–15)
BUN: 13 mg/dL (ref 6–20)
CHLORIDE: 104 mmol/L (ref 101–111)
CO2: 24 mmol/L (ref 22–32)
Calcium: 9.2 mg/dL (ref 8.9–10.3)
Creatinine, Ser: 1.04 mg/dL (ref 0.61–1.24)
GFR calc non Af Amer: >60 mL/min (ref >60)
Glucose, Bld: 109 mg/dL — ABNORMAL HIGH (ref 70–99)
POTASSIUM: 4.2 mmol/L (ref 3.5–5.1)
Sodium: 139 mmol/L (ref 135–145)

## 2014-12-14 LAB — I-STAT TROPONIN, ED: TROPONIN I, POC: 0.06 ng/mL (ref 0.00–0.08)

## 2014-12-14 NOTE — ED Provider Notes (Signed)
TIME SEEN: 10:25 AM  CHIEF COMPLAINT: Chest pain, abnormal EKG  HPI: Damon Walker is a 69 y.o. male with history of hypertension, hyperlipidemia, tobacco abuse who presents to the emergency department from his primary care physician's office, Dr. Orland Mustard, concerned for recent episodes of chest pain with exertion and an abnormal EKG. Patient reports over the past several weeks he has had episodes where he will have tightness in his throat that radiates down to his chest with shortness of breath that occurs with exertion and improves with rest. Last episode happened yesterday morning when walking down the driveway pulling his trash cans. Denies nausea or vomiting with these episodes, dizziness or diaphoresis. He did have an episode that he describes as heartburn last night that improved with Prilosec. States he is still having some pain in his throat today but is worse with swallowing and feels different than the pain that he had yesterday with ambulation. Denies any history of prior stress test or cardiac catheterization. EKG by his primary care physician's office showed anterolateral T wave inversions that were new compared to prior.  ROS: See HPI Constitutional: no fever  Eyes: no drainage  ENT: no runny nose   Cardiovascular:   chest pain  Resp:  SOB  GI: no vomiting GU: no dysuria Integumentary: no rash  Allergy: no hives  Musculoskeletal: no leg swelling  Neurological: no slurred speech ROS otherwise negative  PAST MEDICAL HISTORY/PAST SURGICAL HISTORY:  Past Medical History  Diagnosis Date  . Untreated Hypertension   . Tobacco abuse     a. 30 yrs - 1.5 ppd.  Marland Kitchen HLD (hyperlipidemia)     MEDICATIONS:  Prior to Admission medications   Medication Sig Start Date End Date Taking? Authorizing Provider  aspirin EC 81 MG EC tablet Take 1 tablet (81 mg total) by mouth daily. 06/23/14   Dellia Nims, MD  atorvastatin (LIPITOR) 40 MG tablet Take 1 tablet (40 mg total) by mouth daily at 6 PM. 06/23/14    Tasrif Ahmed, MD  lisinopril (PRINIVIL,ZESTRIL) 10 MG tablet Take 1 tablet (10 mg total) by mouth daily. 06/23/14   Dellia Nims, MD    ALLERGIES:  Allergies  Allergen Reactions  . Latex Itching and Rash    SOCIAL HISTORY:  History  Substance Use Topics  . Smoking status: Former Smoker -- 1.50 packs/day for 30 years    Types: Cigarettes    Quit date: 07/05/2014  . Smokeless tobacco: Not on file  . Alcohol Use: No    FAMILY HISTORY: Family History  Problem Relation Age of Onset  . Other      Adopted - unaware of biological parent's histories.    EXAM: BP 175/62 mmHg  Pulse 56  Temp(Src) 98.2 F (36.8 C) (Oral)  Resp 19  SpO2 99% CONSTITUTIONAL: Alert and oriented and responds appropriately to questions. Well-appearing; well-nourished HEAD: Normocephalic EYES: Conjunctivae clear, PERRL ENT: normal nose; no rhinorrhea; moist mucous membranes; pharynx without lesions noted, no tonsillar hypertrophy or exudate, no uvular deviation, no trismus or drooling, normal phonation NECK: Supple, no meningismus, no LAD, nontender neck, no thyromegaly, normal range of motion in the neck that is painless  CARD: RRR; S1 and S2 appreciated; no murmurs, no clicks, no rubs, no gallops RESP: Normal chest excursion without splinting or tachypnea; breath sounds clear and equal bilaterally; no wheezes, no rhonchi, no rales, no hypoxia or respiratory distress, speaking full sentences ABD/GI: Normal bowel sounds; non-distended; soft, non-tender, no rebound, no guarding, no peritoneal signs BACK:  The  back appears normal and is non-tender to palpation, there is no CVA tenderness EXT: Normal ROM in all joints; non-tender to palpation; no edema; normal capillary refill; no cyanosis, no calf tenderness or swelling    SKIN: Normal color for age and race; warm NEURO: Moves all extremities equally, sensation to light touch intact diffusely, cranial nerves II through XII intact PSYCH: The patient's mood  and manner are appropriate. Grooming and personal hygiene are appropriate.  MEDICAL DECISION MAKING: Patient here with concerns for anginal pain. He still is having some sore throat but describes it as feeling different than the pain that he has when he exerts himself. Not short of breath. He is hemodynamically stable. EKG Showed new T-wave inversions compared to prior in the anterolateral leads. No ST elevation. We'll obtain cardiac labs, chest x-ray. We'll monitor closely.  ED PROGRESS: Damon Walker has had a myoview stress test with Dr. Ellyn Hack in November that showed an abnormal stress test but defect was thought secondary to attenuation. Patient's cardiac labs today are unremarkable. Discussed patient's case with Dr. Mare Ferrari he was the cardiologist on call. He feels given patient's last episode of chest pain was yesterday and he has had negative markers that he can go home with close outpatient follow-up with Dr. Ellyn Hack. He wants him to continue taking his lisinopril, atorvastatin and aspirin daily. We'll also update patient's primary care physician Dr. Orland Mustard. Patient is comfortable with this plan. Discussed strict return precautions.  11:55 AM  D/w Dr. Orland Mustard with [facility] physicians who also closely follow up with patient. Have advised the patient to not exert himself for the next several days and closely follow-up with his cardiologist. He agrees with this plan.    Date: 12/14/2014 9:48 AM  Rate: 61  Rhythm: normal sinus rhythm  QRS Axis: normal  Intervals: normal  ST/T Wave abnormalities: T wave inversions in V3, V4, V5 and V6  Conduction Disutrbances: none  Narrative Interpretation: T wave inversions in V3, V4, V5, V6 that are new compared to prior EKG      [place], DO 12/14/14 1157

## 2014-12-14 NOTE — Discharge Instructions (Signed)
Please continue taking your aspirin 81 mg every day, atorvastatin and lisinopril. I recommend you rest for the next several days and do not have any strenuous activity and increase physical exertion. Please make appointment with your cardiologist as soon as possible. If you ever have chest pain that does not resolve or chest pain at rest you need to return to the emergency department.   Chest Pain (Nonspecific) It is often hard to give a specific diagnosis for the cause of chest pain. There is always a chance that your pain could be related to something serious, such as a heart attack or a blood clot in the lungs. You need to follow up with your health care provider for further evaluation. CAUSES   Heartburn.  Pneumonia or bronchitis.  Anxiety or stress.  Inflammation around your heart (pericarditis) or lung (pleuritis or pleurisy).  A blood clot in the lung.  A collapsed lung (pneumothorax). It can develop suddenly on its own (spontaneous pneumothorax) or from trauma to the chest.  Shingles infection (herpes zoster virus). The chest wall is composed of bones, muscles, and cartilage. Any of these can be the source of the pain.  The bones can be bruised by injury.  The muscles or cartilage can be strained by coughing or overwork.  The cartilage can be affected by inflammation and become sore (costochondritis). DIAGNOSIS  Lab tests or other studies may be needed to find the cause of your pain. Your health care provider may have you take a test called an ambulatory electrocardiogram (ECG). An ECG records your heartbeat patterns over a 24-hour period. You may also have other tests, such as:  Transthoracic echocardiogram (TTE). During echocardiography, sound waves are used to evaluate how blood flows through your heart.  Transesophageal echocardiogram (TEE).  Cardiac monitoring. This allows your health care provider to monitor your heart rate and rhythm in real time.  Holter monitor.  This is a portable device that records your heartbeat and can help diagnose heart arrhythmias. It allows your health care provider to track your heart activity for several days, if needed.  Stress tests by exercise or by giving medicine that makes the heart beat faster. TREATMENT   Treatment depends on what may be causing your chest pain. Treatment may include:  Acid blockers for heartburn.  Anti-inflammatory medicine.  Pain medicine for inflammatory conditions.  Antibiotics if an infection is present.  You may be advised to change lifestyle habits. This includes stopping smoking and avoiding alcohol, caffeine, and chocolate.  You may be advised to keep your head raised (elevated) when sleeping. This reduces the chance of acid going backward from your stomach into your esophagus. Most of the time, nonspecific chest pain will improve within 2-3 days with rest and mild pain medicine.  HOME CARE INSTRUCTIONS   If antibiotics were prescribed, take them as directed. Finish them even if you start to feel better.  For the next few days, avoid physical activities that bring on chest pain. Continue physical activities as directed.  Do not use any tobacco products, including cigarettes, chewing tobacco, or electronic cigarettes.  Avoid drinking alcohol.  Only take medicine as directed by your health care provider.  Follow your health care provider's suggestions for further testing if your chest pain does not go away.  Keep any follow-up appointments you made. If you do not go to an appointment, you could develop lasting (chronic) problems with pain. If there is any problem keeping an appointment, call to reschedule. Elizabeth  IF:   Your chest pain does not go away, even after treatment.  You have a rash with blisters on your chest.  You have a fever. SEEK IMMEDIATE MEDICAL CARE IF:   You have increased chest pain or pain that spreads to your arm, neck, jaw, back, or  abdomen.  You have shortness of breath.  You have an increasing cough, or you cough up blood.  You have severe back or abdominal pain.  You feel nauseous or vomit.  You have severe weakness.  You faint.  You have chills. This is an emergency. Do not wait to see if the pain will go away. Get medical help at once. Call your local emergency services (911 in [place] Do not drive yourself to the hospital. MAKE SURE YOU:   Understand these instructions.  Will watch your condition.  Will get help right away if you are not doing well or get worse. Document Released: 05/01/2005 Document Revised: 07/27/2013 Document Reviewed: 02/25/2008 [facility] Patient Information 2015 [place], [place]. This information is not intended to replace advice given to you by your health care provider. Make sure you discuss any questions you have with your health care provider.

## 2014-12-14 NOTE — ED Notes (Signed)
Pt sent to ED from MD office. Pts MD noticed some EKG changes comparing strips. Pt is A/O. Pt denies any CP, N/V.

## 2014-12-14 NOTE — Telephone Encounter (Signed)
Damon Walker is calling because he went to his pcp and did an ekg and it didn't look good . PCP thinks he is not getting enough oxygen to his heart . When he breath he gets a sore throat and if he exerts himself to much the sore throat pushes down and give him chest pains . Please call   Thanks

## 2014-12-14 NOTE — Telephone Encounter (Signed)
Goes to invalid number when dialed.

## 2014-12-15 ENCOUNTER — Encounter: Payer: Self-pay | Admitting: Cardiology

## 2014-12-15 ENCOUNTER — Ambulatory Visit (INDEPENDENT_AMBULATORY_CARE_PROVIDER_SITE_OTHER): Payer: Medicare Other | Admitting: Cardiology

## 2014-12-15 VITALS — BP 178/82 | HR 71 | Ht 67.5 in | Wt 200.8 lb

## 2014-12-15 DIAGNOSIS — R079 Chest pain, unspecified: Secondary | ICD-10-CM

## 2014-12-15 MED ORDER — LISINOPRIL 20 MG PO TABS: 20.0000 mg | ORAL_TABLET | Freq: Every day | ORAL | Status: DC

## 2014-12-15 NOTE — Telephone Encounter (Signed)
Spoke to patient. He gives recounting of events that had happened last November including hospitalization, post-hospital w/u including stress test, carotid doppler, 2d echo. Seen for office visit by B. Rosita Fire PA in December. Was supposed to establish care w/ Dr. Ellyn Hack at some point.  Following up now because he states for past several days he has had a "pain in throat" which he sought evaluation for w/ PCP on 5/11. EKG was done, PCP sent to ED same-day for concerning EKG. Pt was eval'd & d/c'ed home there. Denied symptoms of CP, N&V. Denies these today also.  Notes only the pain in throat, which he states is brought on more by exertion. He does not state anything alleviates his pain, but notes it is intermittent in nature.  Informed I could add for Dr. Ellyn Hack, but it would not be until July for a return appt - thought appropriate to have eval'd in interim by [facility]. Added for B. Simmons to see today on open slot. Discussed w/ provider. Pt verbalized acknowledgement of appt time/loc/provider.

## 2014-12-15 NOTE — Patient Instructions (Signed)
Your physician recommends that you schedule a follow-up appointment in: PENDING TEST RESULTS  INCREASE LISINOPRIL TO 20 MG ONCE DAILY  Your physician has requested that you have an exercise tolerance test. For further information please visit [email or web address]. Please also follow instruction sheet, as given.    Exercise Stress Electrocardiogram An exercise stress electrocardiogram is a test to check how blood flows to your heart. It is done to find areas of poor blood flow. You will need to walk on a treadmill for this test. The electrocardiogram will record your heartbeat when you are at rest and when you are exercising. BEFORE THE PROCEDURE  Do not have drinks with caffeine or foods with caffeine for 24 hours before the test, or as told by your doctor. This includes coffee, tea (even decaf tea), sodas, chocolate, and cocoa.  Follow your doctor's instructions about eating and drinking before the test.  Ask your doctor what medicines you should or should not take before the test. Take your medicines with water unless told by your doctor not to.  If you use an inhaler, bring it with you to the test.  Bring a snack to eat after the test.  Do not  smoke for 4 hours before the test.  Do not put lotions, powders, creams, or oils on your chest before the test.  Wear comfortable shoes and clothing. PROCEDURE  You will have patches put on your chest. Small areas of your chest may need to be shaved. Wires will be connected to the patches.  Your heart rate will be watched while you are resting and while you are exercising.  You will walk on the treadmill. The treadmill will slowly get faster to raise your heart rate.  The test will take about 1-2 hours. AFTER THE PROCEDURE  Your heart rate and blood pressure will be watched after the test.  You may return to your normal diet, activities, and medicines or as told by your doctor. Document Released: 01/08/2008 Document Revised:  12/06/2013 Document Reviewed: 03/29/2013 [facility] Patient Information 2015 [place], [place]. This information is not intended to replace advice given to you by your health care provider. Make sure you discuss any questions you have with your health care provider.

## 2014-12-15 NOTE — Telephone Encounter (Signed)
Left message on machine for patient to call.

## 2014-12-15 NOTE — Progress Notes (Signed)
12/15/2014 Damon Walker   1945/12/14  [id number]  Primary Physician [name], MD Primary Cardiologist: Dr. Ellyn Hack  Reason for Vist/CC: Exertional Throat/CP.  HPI:  The patient is a 69 y/o male with no prior cardiac history. He was admitted to [facility] last fall when he presented for evaluation for a sudden episode of diaphoresis, lightheadedness, and nausea. In the ED, he began to feel better once he was able to lie down. He was markedly hypertensive @ 185/80. Troponin and ECG were unremarkable. D Dimer was mildly elevated. CTA was neg for PE. Head CT was abnl but not acute. He was admitted and r/o for MI. He had no recurrent symptoms or events on telemetry. He was discharge home on lisinopril for BP and placed on a statin. He was instructed to f/u for an OP stress test. This was performed 06/29/14. This demonstrated an area of possible prior MI, but no evidence of Ischemia. It was interpreted as an Intermediate risk NST. I saw him back in clinic for post hospital f/u on 07/11/14 and he denied any further symptoms. BP was better controlled on lisinopril at 128/76. I reviewed his stress test findings with Dr. Ellyn Hack and he felt that the "deftect" seen on images felt to be likely attenuation. No further w/u was indicated. He was instructed to f/u as needed.    He reports to clinic today with complaint of exertional throat pain radiating to his chest. Described as tightness. No pain at rest. Also has exertional dyspnea. He has had some relief with addition of PPI. Reports compliance with meds. He was seen by his PCP recently with similar complaint and PCP was concerned as his EKG showed new anterolateral Twave inversions. He was referred back to our clinic. He is currently pain free.    Current Outpatient Prescriptions  Medication Sig Dispense Refill  . aspirin EC 81 MG EC tablet Take 1 tablet (81 mg total) by mouth daily. 90 tablet 3  . atorvastatin (LIPITOR) 40 MG tablet Take 1  tablet (40 mg total) by mouth daily at 6 PM. 90 tablet 3  . lisinopril (PRINIVIL,ZESTRIL) 20 MG tablet Take 1 tablet (20 mg total) by mouth daily. 30 tablet 12   No current facility-administered medications for this visit.    Allergies  Allergen Reactions  . Latex Itching and Rash    History   Social History  . Marital Status: Married    Spouse Name: N/A  . Number of Children: N/A  . Years of Education: N/A   Occupational History  . Not on file.   Social History Main Topics  . Smoking status: Current Every Day Smoker -- 1.00 packs/day for 30 years    Types: Cigarettes    Last Attempt to Quit: 07/05/2014  . Smokeless tobacco: Not on file  . Alcohol Use: No  . Drug Use: No  . Sexual Activity: Not on file   Other Topics Concern  . Not on file   Social History Narrative   Lives in [place] with wife. Originally from [place].     Review of Systems: General: negative for chills, fever, night sweats or weight changes.  Cardiovascular: negative for chest pain, dyspnea on exertion, edema, orthopnea, palpitations, paroxysmal nocturnal dyspnea or shortness of breath Dermatological: negative for rash Respiratory: negative for cough or wheezing Urologic: negative for hematuria Abdominal: negative for nausea, vomiting, diarrhea, bright red blood per rectum, melena, or hematemesis Neurologic: negative for visual changes, syncope, or dizziness All other systems reviewed and  are otherwise negative except as noted above.    Blood pressure 178/82, pulse 71, height 5' 7.5" (1.715 m), weight 200 lb 12.8 oz (91.082 kg).  General appearance: alert, cooperative and no distress Neck: no carotid bruit and no JVD Lungs: clear to auscultation bilaterally Heart: regular rate and rhythm, S1, S2 normal, no murmur, click, rub or gallop Extremities: no LEE Pulses: 2+ and symmetric Skin: warm and dry Neurologic: Grossly normal  EKG NSR. anterolateral TWI  ASSESSMENT AND PLAN:   1. Exertional  Chest/Throat Pain: patient had NST ~6 months ago that showed no ischemia. He now has exertional chest/throat pain. No resting symptoms. Also relief with PPI. Will arrange for exercise tolerance test to r/o ischemia.   2. HTN: BP elevated in the 158X systolic. Will increase lisinopril to 20 mg daily.   PLAN  F/U after ETT  SIMMONS, [facility] 12/15/2014 4:46 PM

## 2014-12-15 NOTE — Telephone Encounter (Signed)
Pt is wanting to be seen by Dr. Ellyn Hack due to a recent visit to the ED for chest pain. Please call back   Thanks

## 2014-12-28 ENCOUNTER — Other Ambulatory Visit: Payer: Self-pay | Admitting: Family Medicine

## 2014-12-28 DIAGNOSIS — Q839 Congenital malformation of breast, unspecified: Secondary | ICD-10-CM

## 2014-12-28 DIAGNOSIS — R0989 Other specified symptoms and signs involving the circulatory and respiratory systems: Secondary | ICD-10-CM

## 2015-01-09 ENCOUNTER — Ambulatory Visit
Admission: RE | Admit: 2015-01-09 | Discharge: 2015-01-09 | Disposition: A | Discharge status: Home / Self Care | Payer: Medicare Other | Source: Ambulatory Visit | Attending: Family Medicine | Admitting: Family Medicine

## 2015-01-09 DIAGNOSIS — R0989 Other specified symptoms and signs involving the circulatory and respiratory systems: Secondary | ICD-10-CM

## 2015-01-10 ENCOUNTER — Telehealth (HOSPITAL_COMMUNITY): Payer: Self-pay

## 2015-01-10 NOTE — Telephone Encounter (Signed)
Encounter complete. 

## 2015-01-11 ENCOUNTER — Other Ambulatory Visit: Payer: Medicare Other

## 2015-01-11 ENCOUNTER — Telehealth (HOSPITAL_COMMUNITY): Payer: Self-pay | Admitting: *Deleted

## 2015-01-12 ENCOUNTER — Encounter (HOSPITAL_COMMUNITY): Payer: Medicare Other

## 2015-01-23 ENCOUNTER — Encounter: Payer: Self-pay | Admitting: Vascular Surgery

## 2015-01-24 ENCOUNTER — Other Ambulatory Visit: Payer: Self-pay | Admitting: Cardiology

## 2015-01-24 DIAGNOSIS — R079 Chest pain, unspecified: Secondary | ICD-10-CM

## 2015-01-25 ENCOUNTER — Telehealth (HOSPITAL_COMMUNITY): Payer: Self-pay | Admitting: *Deleted

## 2015-01-30 ENCOUNTER — Other Ambulatory Visit: Payer: Self-pay

## 2015-02-02 ENCOUNTER — Encounter (HOSPITAL_COMMUNITY): Payer: Medicare Other

## 2015-02-17 ENCOUNTER — Encounter: Payer: Self-pay | Admitting: Vascular Surgery

## 2015-02-21 ENCOUNTER — Encounter: Payer: Self-pay | Admitting: Vascular Surgery

## 2015-02-21 ENCOUNTER — Ambulatory Visit (INDEPENDENT_AMBULATORY_CARE_PROVIDER_SITE_OTHER): Payer: Medicare Other | Admitting: Vascular Surgery

## 2015-02-21 VITALS — BP 125/72 | HR 86 | Temp 99.4°F | Resp 16 | Ht 68.0 in | Wt 197.0 lb

## 2015-02-21 DIAGNOSIS — I6523 Occlusion and stenosis of bilateral carotid arteries: Secondary | ICD-10-CM | POA: Diagnosis not present

## 2015-02-21 NOTE — Progress Notes (Signed)
New Carotid Patient  Referred by:  Damon Pepper, MD [place], [place] [address]  Reason for referral: carotid stenosis  History of Present Illness  [place] is a 69 y.o. (20-Jan-1946) male who presents for evaluation of bilateral carotid stenosis. His PCP, [place] detected bilateral carotid bruits on exam and referred the patient for carotid duplex. The patient denies any history of TIA or stroke symptoms.  The patient has never had amaurosis fugax or monocular blindness.  The patient has not had facial drooping or hemiplegia.  The patient has never had receptive or expressive aphasia. The patient's risks factors for carotid disease include: hypertension, hyperlipidemia and current smoker.  He also mentions having shortness of breath with exertion as well as throat pain/chest pain with exertion. He is currently being followed by [place] with cardiology. He underwent a nuclear stress test in November 2015 that revealed no ischemia. He was scheduled for an exercise tolerance test that was postponed secondary to eczema breakout.   He also mentions some discoloration of his lower legs. He denies any swelling, heaviness or aching of legs. He does not ambulate enough to elicit claudication. His ambulation is limited by right hip pain.    Past Medical History  Diagnosis Date  . Untreated Hypertension   . Tobacco abuse     a. 30 yrs - 1.5 ppd.  Marland Kitchen HLD (hyperlipidemia)     Past Surgical History  Procedure Laterality Date  . S/p appendectomy      Age 56  . S/p inguinal hernia repair      In his 20's.    History   Social History  . Marital Status: Married    Spouse Name: N/A  . Number of Children: N/A  . Years of Education: N/A   Occupational History  . Not on file.   Social History Main Topics  . Smoking status: Current Every Day Smoker -- 1.00 packs/day for 30 years    Types: Cigarettes    Last Attempt to Quit: 07/05/2014  .  Smokeless tobacco: Not on file  . Alcohol Use: No  . Drug Use: No  . Sexual Activity: Not on file   Other Topics Concern  . Not on file   Social History Narrative   Lives in [place] with wife. Originally from [place].    Family History  Problem Relation Age of Onset  . Adopted: Yes  . Other      Adopted - unaware of biological parent's histories.  . Other Mother     eczema    Current Outpatient Prescriptions on File Prior to Visit  Medication Sig Dispense Refill  . aspirin EC 81 MG EC tablet Take 1 tablet (81 mg total) by mouth daily. 90 tablet 3  . lisinopril (PRINIVIL,ZESTRIL) 20 MG tablet Take 1 tablet (20 mg total) by mouth daily. 30 tablet 12  . atorvastatin (LIPITOR) 40 MG tablet Take 1 tablet (40 mg total) by mouth daily at 6 PM. (Patient not taking: Reported on 02/21/2015) 90 tablet 3   No current facility-administered medications on file prior to visit.    Allergies  Allergen Reactions  . Latex Itching and Rash    REVIEW OF SYSTEMS:  (Positives checked otherwise negative)  CARDIOVASCULAR:  [x]  chest pain, []  chest pressure, []  palpitations, []  shortness of breath when laying flat, []  shortness of breath with exertion,  []  pain in feet when walking, []  pain in feet when laying flat, []  history  of blood clot in veins (DVT), []  history of phlebitis, []  swelling in legs, []  varicose veins  PULMONARY:  []  productive cough, []  asthma, []  wheezing  NEUROLOGIC:  []  weakness in arms or legs, []  numbness in arms or legs, []  difficulty speaking or slurred speech, []  temporary loss of vision in one eye, []  dizziness  HEMATOLOGIC:  []  bleeding problems, []  problems with blood clotting too easily  MUSCULOSKEL:  []  joint pain, []  joint swelling  GASTROINTEST:  []  vomiting blood, []  blood in stool     GENITOURINARY:  []  burning with urination, []  blood in urine  PSYCHIATRIC:  []  history of major depression  INTEGUMENTARY:  [x]  rashes, []  ulcers  CONSTITUTIONAL:  []  fever, []   chills  For VQI Use Only  PRE-ADM LIVING: Home  AMB STATUS: Ambulatory  CAD Sx: Stable Angina  PRIOR CHF: None  STRESS TEST: [ ]  No, [ x] Normal, [ ]  + ischemia, [ ]  + MI, [ ]  Both   Physical Examination  Filed Vitals:   02/21/15 1348 02/21/15 1350  BP: 152/78 125/72  Pulse: 82 86  Temp: 99.4 F (37.4 C)   TempSrc: Oral   Resp: 16   Height: 5\' 8"  (1.727 m)   Weight: 197 lb (89.359 kg)   SpO2: 99%    Body mass index is 29.96 kg/(m^2).  General: A&O x 3, WDWN male in NAD  Head: [place]/AT  Neck: Supple  Pulmonary: Sym exp, good air movt, CTAB, no rales, rhonchi, & wheezing  Cardiac: RRR, Nl S1, S2, no Murmurs, rubs or gallops, no carotid bruits.   Vascular: 2+ radial and dorsalis pedis pulses bilaterally   Musculoskeletal: No muscle wasting or atrophy. Extremities without ischemic changes. Multiple spider telangectasias. Some varicosities bilaterally.  No lower extremity swelling. No venous stasis changes. Slightly hyperpigmented patches to anterior shins bilaterally.  No ulcers.   Neurologic: CN 2-12 grossly intact.   Psychiatric: Judgment intact, Mood & affect appropriate for pt's clinical situation  Dermatologic: Mild eczema of hands.   Lymph : No Cervical, Axillary, or Inguinal lymphadenopathy  Non-Invasive Vascular Imaging  CAROTID DUPLEX (Date: 01/09/2015):   R ICA stenosis: 50-69%%  R VA: could not be visualized  L ICA stenosis: 50-69%  L VA:  patent and antegrade  Outside Studies/Documentation 10 pages of outside documents were reviewed including: medical records from PCP, [place] Walker.  Medical Decision Making  Damon Walker is a 69 y.o. male who presents with: bilateral asymptomatic moderate ICA stenosis 50-69%.  The patient has not had any signs of symptoms of TIA or stroke. He will follow up in one year with repeat carotid duplex. He is on an aspirin and statin. He is currently being followed by [place] for exertional chest/throat pain.     Damon Jock, PA-C [facility] Office: [phone] Pager: [phone]  02/21/2015, 2:17 PM  This patient was seen in conjunction with [place]      I have examined the patient, reviewed and agree with above. Asymptomatic moderate bilateral carotid stenosis. Explain symptoms left carotid disease.  Will see in one year with repeat duplex.   Will see PCP for continued workup of his chest complaints  Curt Jews, MD 02/21/2015 2:38 PM

## 2015-02-22 NOTE — Addendum Note (Signed)
Addended by: Dorthula Rue L on: 02/22/2015 11:10 AM   Modules accepted: Orders

## 2015-02-23 ENCOUNTER — Encounter: Payer: Self-pay | Admitting: Family Medicine

## 2015-03-24 ENCOUNTER — Other Ambulatory Visit: Payer: Self-pay | Admitting: Physician Assistant

## 2015-03-24 DIAGNOSIS — R070 Pain in throat: Secondary | ICD-10-CM

## 2015-03-24 DIAGNOSIS — K219 Gastro-esophageal reflux disease without esophagitis: Secondary | ICD-10-CM

## 2015-03-24 DIAGNOSIS — R1310 Dysphagia, unspecified: Secondary | ICD-10-CM

## 2015-03-27 ENCOUNTER — Ambulatory Visit
Admission: RE | Admit: 2015-03-27 | Discharge: 2015-03-27 | Disposition: A | Discharge status: Home / Self Care | Payer: Medicare Other | Source: Ambulatory Visit | Attending: Physician Assistant | Admitting: Physician Assistant

## 2015-03-27 DIAGNOSIS — R070 Pain in throat: Secondary | ICD-10-CM

## 2015-03-27 DIAGNOSIS — R1310 Dysphagia, unspecified: Secondary | ICD-10-CM

## 2015-03-27 DIAGNOSIS — K219 Gastro-esophageal reflux disease without esophagitis: Secondary | ICD-10-CM

## 2015-05-06 DIAGNOSIS — R7303 Prediabetes: Secondary | ICD-10-CM

## 2015-05-06 HISTORY — DX: Prediabetes: R73.03

## 2015-05-08 DIAGNOSIS — Z955 Presence of coronary angioplasty implant and graft: Secondary | ICD-10-CM

## 2015-05-11 ENCOUNTER — Encounter (HOSPITAL_COMMUNITY): Payer: Self-pay | Admitting: Emergency Medicine

## 2015-05-11 ENCOUNTER — Inpatient Hospital Stay (HOSPITAL_COMMUNITY)
Admission: EM | Admit: 2015-05-11 | Discharge: 2015-05-14 | DRG: 246 | Disposition: A | Discharge status: Rehabilitation Facility | Payer: Medicare Other | Attending: Cardiology | Admitting: Cardiology

## 2015-05-11 ENCOUNTER — Ambulatory Visit (HOSPITAL_COMMUNITY): Payer: Medicare Other

## 2015-05-11 ENCOUNTER — Inpatient Hospital Stay (HOSPITAL_COMMUNITY): Payer: Medicare Other

## 2015-05-11 ENCOUNTER — Encounter (HOSPITAL_COMMUNITY)
Admission: EM | Disposition: A | Discharge status: Rehabilitation Facility | Payer: Self-pay | Source: Home / Self Care | Attending: Cardiology

## 2015-05-11 DIAGNOSIS — I63432 Cerebral infarction due to embolism of left posterior cerebral artery: Secondary | ICD-10-CM | POA: Diagnosis not present

## 2015-05-11 DIAGNOSIS — D62 Acute posthemorrhagic anemia: Secondary | ICD-10-CM | POA: Diagnosis not present

## 2015-05-11 DIAGNOSIS — I639 Cerebral infarction, unspecified: Secondary | ICD-10-CM | POA: Diagnosis not present

## 2015-05-11 DIAGNOSIS — Z720 Tobacco use: Secondary | ICD-10-CM

## 2015-05-11 DIAGNOSIS — I779 Disorder of arteries and arterioles, unspecified: Secondary | ICD-10-CM | POA: Diagnosis not present

## 2015-05-11 DIAGNOSIS — I472 Ventricular tachycardia: Secondary | ICD-10-CM | POA: Diagnosis present

## 2015-05-11 DIAGNOSIS — Z7982 Long term (current) use of aspirin: Secondary | ICD-10-CM

## 2015-05-11 DIAGNOSIS — I6523 Occlusion and stenosis of bilateral carotid arteries: Secondary | ICD-10-CM | POA: Diagnosis present

## 2015-05-11 DIAGNOSIS — Z9861 Coronary angioplasty status: Secondary | ICD-10-CM

## 2015-05-11 DIAGNOSIS — Z955 Presence of coronary angioplasty implant and graft: Secondary | ICD-10-CM | POA: Diagnosis not present

## 2015-05-11 DIAGNOSIS — H5347 Heteronymous bilateral field defects: Secondary | ICD-10-CM | POA: Diagnosis not present

## 2015-05-11 DIAGNOSIS — K922 Gastrointestinal hemorrhage, unspecified: Secondary | ICD-10-CM | POA: Diagnosis not present

## 2015-05-11 DIAGNOSIS — I219 Acute myocardial infarction, unspecified: Secondary | ICD-10-CM

## 2015-05-11 DIAGNOSIS — Z87891 Personal history of nicotine dependence: Secondary | ICD-10-CM | POA: Diagnosis present

## 2015-05-11 DIAGNOSIS — I63532 Cerebral infarction due to unspecified occlusion or stenosis of left posterior cerebral artery: Secondary | ICD-10-CM | POA: Diagnosis not present

## 2015-05-11 DIAGNOSIS — I255 Ischemic cardiomyopathy: Secondary | ICD-10-CM

## 2015-05-11 DIAGNOSIS — Y92230 Patient room in hospital as the place of occurrence of the external cause: Secondary | ICD-10-CM | POA: Diagnosis not present

## 2015-05-11 DIAGNOSIS — I10 Essential (primary) hypertension: Secondary | ICD-10-CM | POA: Diagnosis present

## 2015-05-11 DIAGNOSIS — K921 Melena: Secondary | ICD-10-CM | POA: Diagnosis not present

## 2015-05-11 DIAGNOSIS — E785 Hyperlipidemia, unspecified: Secondary | ICD-10-CM | POA: Diagnosis present

## 2015-05-11 DIAGNOSIS — K640 First degree hemorrhoids: Secondary | ICD-10-CM | POA: Diagnosis present

## 2015-05-11 DIAGNOSIS — I2102 ST elevation (STEMI) myocardial infarction involving left anterior descending coronary artery: Secondary | ICD-10-CM | POA: Diagnosis present

## 2015-05-11 DIAGNOSIS — G9389 Other specified disorders of brain: Secondary | ICD-10-CM | POA: Diagnosis present

## 2015-05-11 DIAGNOSIS — K5900 Constipation, unspecified: Secondary | ICD-10-CM | POA: Diagnosis not present

## 2015-05-11 DIAGNOSIS — R7309 Other abnormal glucose: Secondary | ICD-10-CM | POA: Diagnosis present

## 2015-05-11 DIAGNOSIS — H539 Unspecified visual disturbance: Secondary | ICD-10-CM | POA: Diagnosis not present

## 2015-05-11 DIAGNOSIS — I213 ST elevation (STEMI) myocardial infarction of unspecified site: Secondary | ICD-10-CM

## 2015-05-11 DIAGNOSIS — Z8719 Personal history of other diseases of the digestive system: Secondary | ICD-10-CM | POA: Diagnosis not present

## 2015-05-11 DIAGNOSIS — I739 Peripheral vascular disease, unspecified: Secondary | ICD-10-CM

## 2015-05-11 DIAGNOSIS — Z79899 Other long term (current) drug therapy: Secondary | ICD-10-CM | POA: Diagnosis not present

## 2015-05-11 DIAGNOSIS — I6789 Other cerebrovascular disease: Secondary | ICD-10-CM | POA: Diagnosis not present

## 2015-05-11 DIAGNOSIS — T45615A Adverse effect of thrombolytic drugs, initial encounter: Secondary | ICD-10-CM | POA: Diagnosis not present

## 2015-05-11 DIAGNOSIS — R7303 Prediabetes: Secondary | ICD-10-CM | POA: Diagnosis present

## 2015-05-11 DIAGNOSIS — I2109 ST elevation (STEMI) myocardial infarction involving other coronary artery of anterior wall: Secondary | ICD-10-CM | POA: Diagnosis not present

## 2015-05-11 DIAGNOSIS — I151 Hypertension secondary to other renal disorders: Secondary | ICD-10-CM | POA: Diagnosis not present

## 2015-05-11 DIAGNOSIS — Z91040 Latex allergy status: Secondary | ICD-10-CM

## 2015-05-11 DIAGNOSIS — I69398 Other sequelae of cerebral infarction: Secondary | ICD-10-CM | POA: Diagnosis not present

## 2015-05-11 DIAGNOSIS — F17210 Nicotine dependence, cigarettes, uncomplicated: Secondary | ICD-10-CM | POA: Diagnosis present

## 2015-05-11 DIAGNOSIS — L309 Dermatitis, unspecified: Secondary | ICD-10-CM | POA: Diagnosis present

## 2015-05-11 DIAGNOSIS — R402412 Glasgow coma scale score 13-15, at arrival to emergency department: Secondary | ICD-10-CM | POA: Diagnosis not present

## 2015-05-11 DIAGNOSIS — R079 Chest pain, unspecified: Secondary | ICD-10-CM | POA: Diagnosis present

## 2015-05-11 DIAGNOSIS — I2510 Atherosclerotic heart disease of native coronary artery without angina pectoris: Secondary | ICD-10-CM | POA: Insufficient documentation

## 2015-05-11 DIAGNOSIS — I69312 Visuospatial deficit and spatial neglect following cerebral infarction: Secondary | ICD-10-CM | POA: Diagnosis not present

## 2015-05-11 DIAGNOSIS — H538 Other visual disturbances: Secondary | ICD-10-CM | POA: Diagnosis not present

## 2015-05-11 DIAGNOSIS — H53461 Homonymous bilateral field defects, right side: Secondary | ICD-10-CM | POA: Diagnosis not present

## 2015-05-11 DIAGNOSIS — I5043 Acute on chronic combined systolic (congestive) and diastolic (congestive) heart failure: Secondary | ICD-10-CM | POA: Insufficient documentation

## 2015-05-11 DIAGNOSIS — R739 Hyperglycemia, unspecified: Secondary | ICD-10-CM | POA: Diagnosis present

## 2015-05-11 DIAGNOSIS — R269 Unspecified abnormalities of gait and mobility: Secondary | ICD-10-CM | POA: Diagnosis not present

## 2015-05-11 DIAGNOSIS — D3A8 Other benign neuroendocrine tumors: Secondary | ICD-10-CM | POA: Diagnosis present

## 2015-05-11 HISTORY — DX: Atherosclerotic heart disease of native coronary artery without angina pectoris: I25.10

## 2015-05-11 HISTORY — PX: CARDIAC CATHETERIZATION: SHX172

## 2015-05-11 HISTORY — DX: ST elevation (STEMI) myocardial infarction involving left anterior descending coronary artery: I21.02

## 2015-05-11 HISTORY — DX: Peripheral vascular disease, unspecified: I73.9

## 2015-05-11 HISTORY — DX: Prediabetes: R73.03

## 2015-05-11 HISTORY — DX: Disorder of arteries and arterioles, unspecified: I77.9

## 2015-05-11 HISTORY — DX: Ischemic cardiomyopathy: I25.5

## 2015-05-11 LAB — COMPREHENSIVE METABOLIC PANEL
ALT: 18 U/L (ref 17–63)
ALT: 52 U/L (ref 17–63)
ANION GAP: 12 (ref 5–15)
AST: 21 U/L (ref 15–41)
AST: 296 U/L — AB (ref 15–41)
Albumin: 3.6 g/dL (ref 3.5–5.0)
Albumin: 3.4 g/dL — ABNORMAL LOW (ref 3.5–5.0)
Alkaline Phosphatase: 51 U/L (ref 38–126)
Alkaline Phosphatase: 53 U/L (ref 38–126)
Anion gap: 12 (ref 5–15)
BILIRUBIN TOTAL: 0.7 mg/dL (ref 0.3–1.2)
BUN: 9 mg/dL (ref 6–20)
BUN: 8 mg/dL (ref 6–20)
CHLORIDE: 101 mmol/L (ref 101–111)
CHLORIDE: 100 mmol/L — AB (ref 101–111)
CO2: 25 mmol/L (ref 22–32)
CO2: 24 mmol/L (ref 22–32)
Calcium: 9 mg/dL (ref 8.9–10.3)
Calcium: 8.7 mg/dL — ABNORMAL LOW (ref 8.9–10.3)
Creatinine, Ser: 1.13 mg/dL (ref 0.61–1.24)
Creatinine, Ser: 1 mg/dL (ref 0.61–1.24)
Glucose, Bld: 169 mg/dL — ABNORMAL HIGH (ref 65–99)
Glucose, Bld: 138 mg/dL — ABNORMAL HIGH (ref 65–99)
POTASSIUM: 4 mmol/L (ref 3.5–5.1)
POTASSIUM: 3.9 mmol/L (ref 3.5–5.1)
Sodium: 138 mmol/L (ref 135–145)
Sodium: 136 mmol/L (ref 135–145)
TOTAL PROTEIN: 6.8 g/dL (ref 6.5–8.1)
Total Bilirubin: 0.8 mg/dL (ref 0.3–1.2)
Total Protein: 6.6 g/dL (ref 6.5–8.1)

## 2015-05-11 LAB — CBC
HCT: 43.1 % (ref 39.0–52.0)
HCT: 43.9 % (ref 39.0–52.0)
HEMOGLOBIN: 14.8 g/dL (ref 13.0–17.0)
Hemoglobin: 14.1 g/dL (ref 13.0–17.0)
MCH: 30.7 pg (ref 26.0–34.0)
MCH: 31.4 pg (ref 26.0–34.0)
MCHC: 32.7 g/dL (ref 30.0–36.0)
MCHC: 33.7 g/dL (ref 30.0–36.0)
MCV: 93.9 fL (ref 78.0–100.0)
MCV: 93 fL (ref 78.0–100.0)
PLATELETS: 309 10*3/uL (ref 150–400)
Platelets: 290 10*3/uL (ref 150–400)
RBC: 4.59 MIL/uL (ref 4.22–5.81)
RBC: 4.72 MIL/uL (ref 4.22–5.81)
RDW: 13.4 % (ref 11.5–15.5)
RDW: 13.5 % (ref 11.5–15.5)
WBC: 14.1 10*3/uL — AB (ref 4.0–10.5)
WBC: 16.8 10*3/uL — AB (ref 4.0–10.5)

## 2015-05-11 LAB — I-STAT CHEM 8, ED
BUN: 13 mg/dL (ref 6–20)
CALCIUM ION: 1.1 mmol/L — AB (ref 1.13–1.30)
CREATININE: 1.2 mg/dL (ref 0.61–1.24)
Chloride: 103 mmol/L (ref 101–111)
GLUCOSE: 170 mg/dL — AB (ref 65–99)
HCT: 46 % (ref 39.0–52.0)
HEMOGLOBIN: 15.6 g/dL (ref 13.0–17.0)
Potassium: 4 mmol/L (ref 3.5–5.1)
Sodium: 141 mmol/L (ref 135–145)
TCO2: 26 mmol/L (ref 0–100)

## 2015-05-11 LAB — PROTIME-INR
INR: 1.03 (ref 0.00–1.49)
PROTHROMBIN TIME: 13.7 s (ref 11.6–15.2)

## 2015-05-11 LAB — I-STAT TROPONIN, ED: TROPONIN I, POC: 0.15 ng/mL — AB (ref 0.00–0.08)

## 2015-05-11 LAB — GLUCOSE, CAPILLARY
GLUCOSE-CAPILLARY: 142 mg/dL — AB (ref 65–99)
Glucose-Capillary: 142 mg/dL — ABNORMAL HIGH (ref 65–99)
Glucose-Capillary: 138 mg/dL — ABNORMAL HIGH (ref 65–99)
Glucose-Capillary: 136 mg/dL — ABNORMAL HIGH (ref 65–99)

## 2015-05-11 LAB — TROPONIN I: Troponin I: >65 ng/mL (ref <0.031)

## 2015-05-11 LAB — BRAIN NATRIURETIC PEPTIDE: B NATRIURETIC PEPTIDE 5: 104.1 pg/mL — AB (ref 0.0–100.0)

## 2015-05-11 LAB — APTT: aPTT: 28 seconds (ref 24–37)

## 2015-05-11 LAB — MAGNESIUM: MAGNESIUM: 2 mg/dL (ref 1.7–2.4)

## 2015-05-11 LAB — MRSA PCR SCREENING: MRSA BY PCR: NEGATIVE

## 2015-05-11 LAB — POCT ACTIVATED CLOTTING TIME: ACTIVATED CLOTTING TIME: 779 s

## 2015-05-11 LAB — TSH: TSH: 1.006 u[IU]/mL (ref 0.350–4.500)

## 2015-05-11 SURGERY — LEFT HEART CATH AND CORONARY ANGIOGRAPHY
Anesthesia: LOCAL

## 2015-05-11 MED ORDER — SODIUM CHLORIDE 0.9 % IV SOLN
250.0000 mL | INTRAVENOUS | Status: DC | PRN
Start: 2015-05-11 — End: 2015-05-11

## 2015-05-11 MED ORDER — CARVEDILOL 3.125 MG PO TABS
3.1250 mg | ORAL_TABLET | Freq: Two times a day (BID) | ORAL | Status: DC
  Administered 2015-05-11: 3.125 mg via ORAL
  Filled 2015-05-11: qty 1

## 2015-05-11 MED ORDER — LIDOCAINE HCL (PF) 1 % IJ SOLN
INTRAMUSCULAR | Status: AC
  Filled 2015-05-11: qty 30

## 2015-05-11 MED ORDER — SODIUM CHLORIDE 0.9 % IJ SOLN
3.0000 mL | Freq: Two times a day (BID) | INTRAMUSCULAR | Status: DC
  Administered 2015-05-12 – 2015-05-13 (×4): 3 mL via INTRAVENOUS

## 2015-05-11 MED ORDER — ASPIRIN 81 MG PO CHEW
81.0000 mg | CHEWABLE_TABLET | Freq: Every day | ORAL | Status: DC
  Administered 2015-05-11 – 2015-05-14 (×4): 81 mg via ORAL
  Filled 2015-05-11 (×4): qty 1

## 2015-05-11 MED ORDER — PROMETHAZINE HCL 25 MG/ML IJ SOLN
12.5000 mg | Freq: Four times a day (QID) | INTRAMUSCULAR | Status: DC | PRN
  Administered 2015-05-11: 12.5 mg via INTRAVENOUS
  Filled 2015-05-11: qty 1

## 2015-05-11 MED ORDER — SODIUM CHLORIDE 0.9 % IV SOLN
INTRAVENOUS | Status: DC
  Administered 2015-05-11: 04:00:00 via INTRAVENOUS

## 2015-05-11 MED ORDER — ONDANSETRON HCL 4 MG/2ML IJ SOLN
INTRAMUSCULAR | Status: DC | PRN
  Administered 2015-05-11: 4 mg via INTRAVENOUS

## 2015-05-11 MED ORDER — SODIUM CHLORIDE 0.9 % IV SOLN: 250.0000 mL | INTRAVENOUS | Status: DC | PRN

## 2015-05-11 MED ORDER — PANTOPRAZOLE SODIUM 40 MG PO TBEC
40.0000 mg | DELAYED_RELEASE_TABLET | Freq: Two times a day (BID) | ORAL | Status: DC
  Administered 2015-05-11 – 2015-05-14 (×7): 40 mg via ORAL
  Filled 2015-05-11 (×7): qty 1

## 2015-05-11 MED ORDER — SODIUM CHLORIDE 0.9 % IV SOLN
4.0000 ug/kg/min | INTRAVENOUS | Status: AC
  Filled 2015-05-11: qty 50

## 2015-05-11 MED ORDER — SODIUM CHLORIDE 0.9 % IV SOLN
50000.0000 ug | INTRAVENOUS | Status: DC | PRN
  Administered 2015-05-11: 4 ug/kg/min via INTRAVENOUS

## 2015-05-11 MED ORDER — LIDOCAINE HCL (PF) 1 % IJ SOLN
INTRAMUSCULAR | Status: DC | PRN
  Administered 2015-05-11: 5 mL via SUBCUTANEOUS

## 2015-05-11 MED ORDER — CANGRELOR BOLUS VIA INFUSION
INTRAVENOUS | Status: DC | PRN
  Administered 2015-05-11: 2721 ug via INTRAVENOUS

## 2015-05-11 MED ORDER — LISINOPRIL 10 MG PO TABS
20.0000 mg | ORAL_TABLET | Freq: Every day | ORAL | Status: DC
  Administered 2015-05-11 – 2015-05-12 (×2): 20 mg via ORAL
  Filled 2015-05-11 (×2): qty 1

## 2015-05-11 MED ORDER — BIVALIRUDIN 250 MG IV SOLR
INTRAVENOUS | Status: AC
  Filled 2015-05-11: qty 250

## 2015-05-11 MED ORDER — BIVALIRUDIN 250 MG IV SOLR
250.0000 mg | INTRAVENOUS | Status: DC | PRN
  Administered 2015-05-11: 1.75 mg/kg/h via INTRAVENOUS

## 2015-05-11 MED ORDER — SODIUM CHLORIDE 0.9 % IJ SOLN: 3.0000 mL | Freq: Two times a day (BID) | INTRAMUSCULAR | Status: DC

## 2015-05-11 MED ORDER — NITROGLYCERIN 0.4 MG SL SUBL: 0.4000 mg | SUBLINGUAL_TABLET | SUBLINGUAL | Status: DC | PRN

## 2015-05-11 MED ORDER — ONDANSETRON HCL 4 MG/2ML IJ SOLN
INTRAMUSCULAR | Status: AC
  Filled 2015-05-11: qty 2

## 2015-05-11 MED ORDER — HEPARIN (PORCINE) IN NACL 2-0.9 UNIT/ML-% IJ SOLN
INTRAMUSCULAR | Status: AC
  Filled 2015-05-11: qty 1000

## 2015-05-11 MED ORDER — HEPARIN SODIUM (PORCINE) 5000 UNIT/ML IJ SOLN
4000.0000 [IU] | INTRAMUSCULAR | Status: AC
  Administered 2015-05-11: 4000 [IU] via INTRAVENOUS
  Filled 2015-05-11: qty 1

## 2015-05-11 MED ORDER — TICAGRELOR 90 MG PO TABS: 180.0000 mg | ORAL_TABLET | Freq: Two times a day (BID) | ORAL | Status: DC

## 2015-05-11 MED ORDER — POTASSIUM CHLORIDE IN NACL 40-0.9 MEQ/L-% IV SOLN
INTRAVENOUS | Status: AC
  Administered 2015-05-11: 50 mL/h via INTRAVENOUS
  Filled 2015-05-11: qty 1000

## 2015-05-11 MED ORDER — ONDANSETRON HCL 4 MG/2ML IJ SOLN
4.0000 mg | Freq: Four times a day (QID) | INTRAMUSCULAR | Status: DC | PRN
  Administered 2015-05-11: 4 mg via INTRAVENOUS
  Filled 2015-05-11: qty 2

## 2015-05-11 MED ORDER — CANGRELOR TETRASODIUM 50 MG IV SOLR
INTRAVENOUS | Status: AC
  Filled 2015-05-11: qty 50

## 2015-05-11 MED ORDER — CARVEDILOL 6.25 MG PO TABS
6.2500 mg | ORAL_TABLET | Freq: Two times a day (BID) | ORAL | Status: DC
  Administered 2015-05-11 – 2015-05-13 (×3): 6.25 mg via ORAL
  Filled 2015-05-11 (×5): qty 1

## 2015-05-11 MED ORDER — ENOXAPARIN SODIUM 40 MG/0.4ML ~~LOC~~ SOLN
40.0000 mg | SUBCUTANEOUS | Status: DC
  Administered 2015-05-12 – 2015-05-13 (×2): 40 mg via SUBCUTANEOUS
  Filled 2015-05-11 (×2): qty 0.4

## 2015-05-11 MED ORDER — TICAGRELOR 90 MG PO TABS
90.0000 mg | ORAL_TABLET | Freq: Two times a day (BID) | ORAL | Status: DC
  Administered 2015-05-11 – 2015-05-14 (×6): 90 mg via ORAL
  Filled 2015-05-11 (×6): qty 1

## 2015-05-11 MED ORDER — VERAPAMIL HCL 2.5 MG/ML IV SOLN
INTRAVENOUS | Status: DC | PRN
  Administered 2015-05-11: 04:00:00 via INTRA_ARTERIAL

## 2015-05-11 MED ORDER — ENOXAPARIN SODIUM 40 MG/0.4ML ~~LOC~~ SOLN: 40.0000 mg | SUBCUTANEOUS | Status: DC

## 2015-05-11 MED ORDER — ACETAMINOPHEN 325 MG PO TABS: 650.0000 mg | ORAL_TABLET | ORAL | Status: DC | PRN

## 2015-05-11 MED ORDER — IOHEXOL 350 MG/ML SOLN
INTRAVENOUS | Status: DC | PRN
  Administered 2015-05-11: 180 mL via INTRAVENOUS

## 2015-05-11 MED ORDER — ONDANSETRON HCL 4 MG/2ML IJ SOLN
INTRAMUSCULAR | Status: AC
  Administered 2015-05-11: 4 mg
  Filled 2015-05-11: qty 2

## 2015-05-11 MED ORDER — ASPIRIN EC 81 MG PO TBEC: 81.0000 mg | DELAYED_RELEASE_TABLET | Freq: Every day | ORAL | Status: DC

## 2015-05-11 MED ORDER — FENTANYL CITRATE (PF) 100 MCG/2ML IJ SOLN: 25.0000 ug | INTRAMUSCULAR | Status: DC | PRN

## 2015-05-11 MED ORDER — CARVEDILOL 3.125 MG PO TABS
3.1250 mg | ORAL_TABLET | Freq: Once | ORAL | Status: AC
  Administered 2015-05-11: 3.125 mg via ORAL
  Filled 2015-05-11: qty 1

## 2015-05-11 MED ORDER — TICAGRELOR 90 MG PO TABS
ORAL_TABLET | ORAL | Status: AC
  Filled 2015-05-11: qty 2

## 2015-05-11 MED ORDER — BIVALIRUDIN BOLUS VIA INFUSION - CUPID
INTRAVENOUS | Status: DC | PRN
  Administered 2015-05-11: 68.025 mg via INTRAVENOUS

## 2015-05-11 MED ORDER — TICAGRELOR 90 MG PO TABS
ORAL_TABLET | ORAL | Status: DC | PRN
  Administered 2015-05-11: 180 mg via ORAL

## 2015-05-11 MED ORDER — SODIUM CHLORIDE 0.9 % IJ SOLN
3.0000 mL | INTRAMUSCULAR | Status: DC | PRN
Start: 2015-05-11 — End: 2015-05-11

## 2015-05-11 MED ORDER — ROSUVASTATIN CALCIUM 40 MG PO TABS
40.0000 mg | ORAL_TABLET | Freq: Every day | ORAL | Status: DC
  Administered 2015-05-11 – 2015-05-13 (×2): 40 mg via ORAL
  Filled 2015-05-11: qty 1
  Filled 2015-05-11: qty 2
  Filled 2015-05-11: qty 1
  Filled 2015-05-11: qty 2
  Filled 2015-05-11 (×2): qty 1

## 2015-05-11 MED ORDER — SODIUM CHLORIDE 0.9 % IJ SOLN: 3.0000 mL | INTRAMUSCULAR | Status: DC | PRN

## 2015-05-11 MED ORDER — TICAGRELOR 90 MG PO TABS
180.0000 mg | ORAL_TABLET | Freq: Once | ORAL | Status: AC
  Administered 2015-05-11: 180 mg via ORAL
  Filled 2015-05-11: qty 2

## 2015-05-11 SURGICAL SUPPLY — 21 items
BALLN EMERGE MR 2.5X15 (BALLOONS) ×2
BALLN ~~LOC~~ EMERGE MR 3.5X20 (BALLOONS) ×2
BALLOON EMERGE MR 2.5X15 (BALLOONS) IMPLANT
BALLOON ~~LOC~~ EMERGE MR 3.5X20 (BALLOONS) IMPLANT
CATH INFINITI 5FR ANG PIGTAIL (CATHETERS) ×2 IMPLANT
CATH INFINITI JR4 5F (CATHETERS) ×2 IMPLANT
CATH VISTA GUIDE 6FR XBLAD3.5 (CATHETERS) ×1 IMPLANT
DEVICE RAD COMP TR BAND LRG (VASCULAR PRODUCTS) ×2 IMPLANT
ELECT DEFIB PAD ADLT CADENCE (PAD) ×1 IMPLANT
GLIDESHEATH SLEND SS 6F .021 (SHEATH) ×2 IMPLANT
KIT ENCORE 26 ADVANTAGE (KITS) ×2 IMPLANT
KIT HEART LEFT (KITS) ×2 IMPLANT
PACK CARDIAC CATHETERIZATION (CUSTOM PROCEDURE TRAY) ×2 IMPLANT
STENT PROMUS PREM MR 3.0X16 (Permanent Stent) ×1 IMPLANT
STENT PROMUS PREM MR 3.0X38 (Permanent Stent) ×1 IMPLANT
SYR MEDRAD MARK V 150ML (SYRINGE) ×2 IMPLANT
TRANSDUCER W/STOPCOCK (MISCELLANEOUS) ×2 IMPLANT
TUBING CIL FLEX 10 FLL-RA (TUBING) ×2 IMPLANT
WIRE ASAHI PROWATER 180CM (WIRE) ×1 IMPLANT
WIRE HI TORQ VERSACORE-J 145CM (WIRE) ×1 IMPLANT
WIRE SAFE-T 1.5MM-J .035X260CM (WIRE) ×2 IMPLANT

## 2015-05-11 NOTE — Care Management Note (Addendum)
Case Management Note  Patient Details  Name: [place] MRN: [id number] Date of Birth: 07-11-1946  Subjective/Objective:   Adm w stemi                 Action/Plan:lives w wife, pcp dr [name]   Expected Discharge Date:                  Expected Discharge Plan:     In-House Referral:     Discharge planning Services     Post Acute Care Choice:    Choice offered to:     DME Arranged:    DME Agency:     HH Arranged:    [place] Agency:     Status of Service:     Medicare Important Message Given:    Date Medicare IM Given:    Medicare IM give by:    Date Additional Medicare IM Given:    Additional Medicare Important Message give by:     If discussed at [place] of Stay Meetings, dates discussed:    Additional Comments:ur review done. Gave wife 30day free brilinta card. Pt has blue medicare for meds.  Lacretia Leigh, RN 05/11/2015, 7:35 AM

## 2015-05-11 NOTE — Progress Notes (Signed)
Pt had 23 bt run NSVT, asymptomatic.  BB increased.

## 2015-05-11 NOTE — Progress Notes (Signed)
Critical Value troponin >65 reported to [facility], cardiology. No new orders at this time, team will round soon.

## 2015-05-11 NOTE — Progress Notes (Signed)
Patient Name: [place] Walker Date of Encounter: 05/11/2015  Principal Problem:   ST elevation (STEMI) myocardial infarction involving left anterior descending coronary artery (HCC) Active Problems:   HTN (hypertension)   HLD (hyperlipidemia)   Tobacco abuse   Carotid arterial disease (HCC)   ST elevation myocardial infarction (STEMI) involving left anterior descending (LAD) coronary artery with complication [facility])   Primary Cardiologist: Dr Ellyn Hack   Patient Profile: 69 yo male w/ hx intermediate MV but never cathed was admitted 05/11/2015 early am w/ STEMI>>stent LAD, RCA & OM3 w/ sig dz, EF 40%, LVEDP 21.  SUBJECTIVE: No chest or throat pain, nauseated since 3 am (pre-hosp).  OBJECTIVE Filed Vitals:   05/11/15 0615 05/11/15 0630 05/11/15 0700 05/11/15 0731  BP: 139/77 151/77 145/102   Pulse: 77 73 84   Temp:    97.9 F (36.6 C)  TempSrc:    Oral  Resp: 17 18 12    Height:      Weight:      SpO2: 95% 95% 92%     Intake/Output Summary (Last 24 hours) at 05/11/15 0830 Last data filed at 05/11/15 0600  Gross per 24 hour  Intake 126.75 ml  Output      0 ml  Net 126.75 ml   Filed Weights   05/11/15 0343  Weight: 200 lb (90.719 kg)    PHYSICAL EXAM General: Well developed, well nourished, male in no acute distress. Head: Normocephalic, atraumatic.  Neck: Supple without bruits, JVD 9 cm. Lungs:  Resp regular and unlabored, rales bases. Heart: RRR, S1, S2, no S3, S4, or murmur; no rub. Abdomen: Soft, non-tender, non-distended, BS + x 4.  Extremities: No clubbing, cyanosis, edema. R radial cath site w/ TR band in place. Neuro: Alert and oriented X 3. Moves all extremities spontaneously. Psych: Normal affect.  LABS: CBC: Recent Labs  05/11/15 0344 05/11/15 0351 05/11/15 0630  WBC 14.1*  --  16.8*  HGB 14.1 15.6 14.8  HCT 43.1 46.0 43.9  MCV 93.9  --  93.0  PLT 309  --  290   INR: Recent Labs  05/11/15 0344  INR 1.76   Basic Metabolic  Panel: Recent Labs  05/11/15 0344 05/11/15 0351 05/11/15 0630  NA 138 141 136  K 4.0 4.0 3.9  CL 101 103 100*  CO2 25  --  24  GLUCOSE 169* 170* 138*  BUN 9 13 8   CREATININE 1.13 1.20 1.00  CALCIUM 9.0  --  8.7*  MG  --   --  2.0   Liver Function Tests: Recent Labs  05/11/15 0344 05/11/15 0630  AST 21 296*  ALT 18 52  ALKPHOS 51 53  BILITOT 0.7 0.8  PROT 6.8 6.6  ALBUMIN 3.6 3.4*   Cardiac Enzymes: Recent Labs  05/11/15 0630  TROPONINI >65.00*    Recent Labs  05/11/15 0347  TROPIPOC 0.15*   BNP:  B NATRIURETIC PEPTIDE  Date/Time Value Ref Range Status  05/11/2015 06:30 AM 104.1* 0.0 - 100.0 pg/mL Final   TELE:  SR, 10 bt run NSVT      ECG: 5:30 am 10/06 Still w/ ST elevation anteriorly  Current Medications:  . aspirin  81 mg Oral Daily  . carvedilol  3.125 mg Oral BID WC  . [START ON 05/12/2015] enoxaparin (LOVENOX) injection  40 mg Subcutaneous Q24H  . lisinopril  20 mg Oral Daily  . pantoprazole  40 mg Oral BID  . rosuvastatin  40 mg Oral q1800  .  sodium chloride  3 mL Intravenous Q12H  . sodium chloride  3 mL Intravenous Q12H  . ticagrelor  180 mg Oral Once  . ticagrelor  90 mg Oral BID   . sodium chloride 100 mL/hr (05/11/15 0439)    ASSESSMENT AND PLAN: Principal Problem:   ST elevation (STEMI) myocardial infarction involving left anterior descending coronary artery ([place]) - continue current rx, make sure has aggressive nausea management - on ASA, BB, ACE, statin - IVF ordered at 50 cc/hr x 6 hr.  Active Problems:   HTN (hypertension) - on home dose lisinopril - Coreg added    HLD (hyperlipidemia) - high-dose statin - however, AST was elevated on LFTs, recheck in am    Tobacco abuse - cessation advised - ck CXR    Carotid arterial disease ([place]) - follow    ST elevation myocardial infarction (STEMI) involving left anterior descending (LAD) coronary artery with complication ([place]) - see above    Hyperglycemia - ck A1c     NSVT - likely reperfusion arrhythmia - K+ was 3.9, change IVF to include K+ and follow -  Mg 2.0  Signed, Barrett, Rhonda , PA-C 8:30 AM 05/11/2015

## 2015-05-11 NOTE — H&P (Signed)
Physician History and Physical    Patient ID: [place] MRN: [id number] DOB/AGE: [birth year] 69 y.o. Admit date: 05/11/2015  Primary Care Physician: [name], MD Primary Cardiologist Glenetta Hew MD  HPI: [place] Walker is a 69 yo WM who presents with an anterior STEMI. He awoke at 1 am with severe throat pain radiating into his chest and down both arms. Mild SOB. No relief with sl Ntg. Ecg shows new 1 mm ST elevation in leads V1-2 with reciprocal changes consistent with anterior STEMI.  [place] does have a history of prior chest pain. He was admitted to [facility] in November 2015 when he presented for evaluation for a sudden episode of diaphoresis, lightheadedness, and nausea. He was markedly hypertensive @ 185/80. Troponin and ECG were unremarkable. D Dimer was mildly elevated. CTA was neg for PE. Head CT was abnl but not acute. He was admitted and r/o for MI. He had no recurrent symptoms or events on telemetry. He was discharge home on lisinopril for BP and placed on a statin. He was instructed to f/u for an OP stress test. This was performed 06/29/14. This demonstrated an area of possible prior MI, but no evidence of Ischemia. It was interpreted as an Intermediate risk NST.  He was seen in the ED in May with recurrent chest pain. Initial evaluation was negative. He was seen in our office and a stress test was ordered but never done for reasons that are not clear. Patient states he had the same throat pain then that he had today. He has a history of HTN, HL, and tobacco abuse (1.5 pk/day). He also has a history of carotid arterial disease 50-69% bilateral by doppler. He was taking lipitor but he says it was stopped due to a skin condition.  Review of systems complete and found to be negative unless listed above. Past Medical History  Diagnosis Date  . Untreated Hypertension   . Tobacco abuse     a. 30 yrs - 1.5 ppd.  Marland Kitchen HLD (hyperlipidemia)   . Carotid arterial disease (HCC)       Family History  Problem Relation Age of Onset  . Adopted: Yes  . Other      Adopted - unaware of biological parent's histories.  . Other Mother     eczema   . Cancer Mother     Social History   Social History  . Marital Status: Married    Spouse Name: N/A  . Number of Children: 3  . Years of Education: N/A   Occupational History  . retired from [employer]  . Smoking status: Current Every Day Smoker -- 1.00 packs/day for 30 years    Types: Cigarettes  . Smokeless tobacco: Not on file  . Alcohol Use: No  . Drug Use: No  . Sexual Activity: Not on file   Other Topics Concern  . Not on file   Social History Narrative   Lives in [place] with wife. Originally from [place].    Past Surgical History  Procedure Laterality Date  . S/p appendectomy      Age 69  . S/p inguinal hernia repair      In his 20's.     Prescriptions prior to admission  Medication Sig Dispense Refill Last Dose  . lisinopril (PRINIVIL,ZESTRIL) 20 MG tablet Take 1 tablet (20 mg total) by mouth daily. 30 tablet 12 05/10/2015 at Unknown time  . pantoprazole (PROTONIX) 40 MG tablet Take 40  mg by mouth 2 (two) times daily.   05/10/2015 at Unknown time  . aspirin EC 81 MG EC tablet Take 1 tablet (81 mg total) by mouth daily. (Patient not taking: Reported on 05/11/2015) 90 tablet 3 Not Taking at Unknown time  . atorvastatin (LIPITOR) 40 MG tablet Take 1 tablet (40 mg total) by mouth daily at 6 PM. (Patient not taking: Reported on 02/21/2015) 90 tablet 3 Not Taking at Unknown time    Physical Exam: Blood pressure 140/81, pulse 78, temperature 98.2 F (36.8 C), temperature source Oral, resp. rate 9, height 5\' 8"  (1.727 m), weight 90.719 kg (200 lb), SpO2 93 %.  Current Weight  05/11/15 90.719 kg (200 lb)  02/21/15 89.359 kg (197 lb)  12/15/14 91.082 kg (200 lb 12.8 oz)    GENERAL:  Acutely ill appearing WM with active chest pain. HEENT:  PERRL, EOMI, sclera are clear.  Oropharynx is clear. He is edentulous. NECK:  No jugular venous distention, carotid upstroke brisk and symmetric, no bruits, no thyromegaly or adenopathy LUNGS:  Clear to auscultation bilaterally CHEST:  Unremarkable HEART:  RRR,  PMI not displaced or sustained,S1 and S2 within normal limits, no S3, no S4: no clicks, no rubs, no murmurs ABD:  Soft, obese, nontender. BS +, no masses or bruits. No hepatomegaly, no splenomegaly EXT:  2 + pulses throughout, no edema, no cyanosis no clubbing SKIN:  Warm and dry.  No rashes NEURO:  Alert and oriented x 3. Cranial nerves II through XII intact. PSYCH:  Cognitively intact    Labs:   Lab Results  Component Value Date   WBC 14.1* 05/11/2015   HGB 15.6 05/11/2015   HCT 46.0 05/11/2015   MCV 93.9 05/11/2015   PLT 309 05/11/2015     Recent Labs Lab 05/11/15 0344 05/11/15 0351  NA 138 141  K 4.0 4.0  CL 101 103  CO2 25  --   BUN 9 13  CREATININE 1.13 1.20  CALCIUM 9.0  --   PROT 6.8  --   BILITOT 0.7  --   ALKPHOS 51  --   ALT 18  --   AST 21  --   GLUCOSE 169* 170*   Lab Results  Component Value Date   TROPONINI <0.30 06/23/2014   TROPONINI <0.30 06/22/2014   TROPONINI <0.30 06/22/2014    Lab Results  Component Value Date   CHOL 188 06/23/2014   Lab Results  Component Value Date   HDL 29* 06/23/2014   Lab Results  Component Value Date   LDLCALC 128* 06/23/2014   Lab Results  Component Value Date   TRIG 157* 06/23/2014   Lab Results  Component Value Date   CHOLHDL 6.5 06/23/2014   No results found for: LDLDIRECT  No results found for: PROBNP Lab Results  Component Value Date   TSH 1.670 06/23/2014   Lab Results  Component Value Date   HGBA1C 6.1* 06/22/2014   POC troponin is 0.15  Radiology: No results found.  EKG: NSR with ST elevation in V1-2 and reciprocal changes consistent with anterior STEMI. I have personally reviewed and interpreted this study.   ASSESSMENT AND PLAN:  1. Anterior STEMI.  Will proceed with emergent cardiac cath and PCI. Patient administered IV heparin bolus in the ED and oral ASA.  2. HTN- plan to continue lisinopril. Add beta blocker as tolerated.  3. Hyperlipidemia. ? Intolerance to lipitor. Will start high dose Crestor.  4. Tobacco abuse. Smoking cessation counseling.  5. Bilateral  moderate carotid arterial disease. Asymptomatic.   Signed: Jaizon Deroos [place], [place]  05/11/2015, 5:51 AM

## 2015-05-11 NOTE — ED Provider Notes (Signed)
CSN: [id number]     Arrival date & time 05/11/15  0626 History  By signing my name below, I, Evelene Croon, attest that this documentation has been prepared under the direction and in the presence of Ripley Fraise, MD . Electronically Signed: Evelene Croon, Scribe. 05/11/2015. 4:02 AM.    Chief Complaint  Patient presents with  . Code STEMI   LEVEL 5 CAVEAT DUE TO acuity of medical condition  The history is provided by the patient and the EMS personnel. No language interpreter was used.    HPI Comments:  [place] is a 69 y.o. male with a history of HTN and HLD, who presents to the Emergency Department via EMS complaining of sudden onset 8-9/10 CP that woke him from his sleep ~0100 this am. EMS notes CP was preceded by 9/10 pain in his throat. He was given 3 nitro, 325 ASA and zofran en route. Pt reports associated diaphoresis, and HA. He denies SOB, blood in stool, abdominal pain and LOC. Pt also reports h/o similar throat pain without diagnosis. EMS also note possible h/o GERD.     Past Medical History  Diagnosis Date  . Untreated Hypertension   . Tobacco abuse     a. 30 yrs - 1.5 ppd.  Marland Kitchen HLD (hyperlipidemia)    Past Surgical History  Procedure Laterality Date  . S/p appendectomy      Age 60  . S/p inguinal hernia repair      In his 20's.   Family History  Problem Relation Age of Onset  . Adopted: Yes  . Other      Adopted - unaware of biological parent's histories.  . Other Mother     eczema    Social History  Substance Use Topics  . Smoking status: Current Every Day Smoker -- 1.00 packs/day for 30 years    Types: Cigarettes  . Smokeless tobacco: None  . Alcohol Use: No    Review of Systems  Unable to perform ROS: Acuity of condition   Allergies  Latex  Home Medications   Prior to Admission medications   Medication Sig Start Date End Date Taking? Authorizing Provider  lisinopril (PRINIVIL,ZESTRIL) 20 MG tablet Take 1 tablet (20 mg total) by mouth  daily. 12/15/14  Yes Brittainy Erie Noe, PA-C  pantoprazole (PROTONIX) 40 MG tablet Take 40 mg by mouth 2 (two) times daily.   Yes Historical Provider, MD  aspirin EC 81 MG EC tablet Take 1 tablet (81 mg total) by mouth daily. Patient not taking: Reported on 05/11/2015 06/23/14   Dellia Nims, MD  atorvastatin (LIPITOR) 40 MG tablet Take 1 tablet (40 mg total) by mouth daily at 6 PM. Patient not taking: Reported on 02/21/2015 06/23/14   Tasrif Ahmed, MD   BP 166/83 mmHg  Temp(Src) 98.2 F (36.8 C) (Oral)  Resp 20  Ht 5\' 8"  (1.727 m)  Wt 200 lb (90.719 kg)  BMI 30.42 kg/m2  SpO2 96% Physical Exam CONSTITUTIONAL: Well developed/well nourished HEAD: Normocephalic/atraumatic EYES: EOMI/PERRL ENMT: Mucous membranes moist NECK: supple no meningeal signs SPINE/BACK:entire spine nontender CV: S1/S2 noted, no murmurs/rubs/gallops noted LUNGS: Lungs are clear to auscultation bilaterally, no apparent distress ABDOMEN: soft, nontender, no rebound or guarding NEURO: Pt is awake/alert/appropriate, moves all extremitiesx4.  No facial droop.   EXTREMITIES: pulses normal/equal x 4 , full ROM SKIN: warm, color normal PSYCH: no abnormalities of mood noted, alert and oriented to situation   ED Course  Procedures   DIAGNOSTIC STUDIES:  Oxygen Saturation  is 96% on 2L [place], normal by my interpretation.    COORDINATION OF CARE:  3:43 AM Discussed treatment plan with pt at bedside and pt agreed to plan.  Pt noted have STEMI by EMS and given ASA/NTG Pt had some relief of CP that returned after arrival to the ER He was given heparin in the ED EKG in the ED reveals elevation along anterior leads, similar to prehospital EKG His troponin is elevated CODE STEMI enacted and patient taken urgently to cardiac cath lab [place], ED - Abnormal; Notable for the following:    Troponin i, poc 0.15 (*)    All other components within normal limits  I-STAT CHEM 8, ED -  Abnormal; Notable for the following:    Glucose, Bld 170 (*)    Calcium, Ion 1.10 (*)    All other components within normal limits  APTT  CBC  COMPREHENSIVE METABOLIC PANEL  PROTIME-INR    I have personally reviewed and evaluated these  lab results as part of my medical decision-making.   EKG Interpretation   Date/Time:  Thursday May 11 2015 03:39:26 EDT Ventricular Rate:  88 PR Interval:  155 QRS Duration: 112 QT Interval:  364 QTC Calculation: 440 R Axis:   2 Text Interpretation:  Sinus rhythm Anteroseptal infarct, old Repol abnrm  suggests ischemia, lateral leads st elevation noted in V1/V2 Abnormal ekg  changed from prior Confirmed by Christy Gentles  MD, [place] ([id number]) on 05/11/2015  3:43:25 AM     CRITICAL CARE Performed by: Ripley Fraise, MD Total critical care time: 30 Critical care time was exclusive of separately billable procedures and treating other patients. Critical care was necessary to treat or prevent imminent or life-threatening deterioration. Critical care was time spent personally by me on the following activities: development of treatment plan with patient and/or surrogate as well as nursing,  evaluation of patient's response to treatment, examination of patient, obtaining history from patient or surrogate, ordering and performing treatments and interventions, ordering and review of laboratory studies,pulse oximetry and re-evaluation of patient's condition. PATIENT WITH STEMI, REQUIRING IV HEPARIN AND URGENT TRANSFER TO CARDIAC CATH LAB  MDM   Final diagnoses:  ST elevation myocardial infarction (STEMI), unspecified artery [facility])    Nursing notes including past medical history and social history reviewed and considered in documentation Labs/vital reviewed myself and considered during evaluation   I, Sharyon Cable, personally performed the services described in this documentation. All medical record entries made by the scribe were at my direction and  in my presence.  I have reviewed the chart  and agree that the record reflects my personal performance and is accurate and complete. Sharyon Cable.  05/11/2015. 4:27 AM.        Ripley Fraise, MD 05/11/15 [phone]

## 2015-05-11 NOTE — ED Notes (Signed)
Cath lab is ready for patient.

## 2015-05-11 NOTE — Progress Notes (Signed)
@  1256 pt had 23 beat run of VT, pt asymptomatic VSS, NAD. MD notified new orders for increased dose of coreg. Will cont to monitor closely.

## 2015-05-11 NOTE — ED Notes (Signed)
Per EMS, pt was awakened from sleep around 0100 this AM with severe chest and throat pain. Pt's initial EKG for EMS showed ST elevation in leads V1, V2, V3. Pt has received 3 NTG, 324 ASA and 4mg  Zofran.

## 2015-05-12 DIAGNOSIS — I519 Heart disease, unspecified: Secondary | ICD-10-CM

## 2015-05-12 DIAGNOSIS — I472 Ventricular tachycardia: Secondary | ICD-10-CM

## 2015-05-12 LAB — HEMOGLOBIN A1C
HEMOGLOBIN A1C: 6 % — AB (ref 4.8–5.6)
Mean Plasma Glucose: 126 mg/dL

## 2015-05-12 LAB — LIPID PANEL
Cholesterol: 252 mg/dL — ABNORMAL HIGH (ref 0–200)
HDL: 33 mg/dL — AB (ref >40)
LDL CALC: 186 mg/dL — AB (ref 0–99)
TRIGLYCERIDES: 165 mg/dL — AB (ref <150)
Total CHOL/HDL Ratio: 7.6 RATIO
VLDL: 33 mg/dL (ref 0–40)

## 2015-05-12 LAB — CBC
HEMATOCRIT: 41 % (ref 39.0–52.0)
HEMOGLOBIN: 13.6 g/dL (ref 13.0–17.0)
MCH: 31.4 pg (ref 26.0–34.0)
MCHC: 33.2 g/dL (ref 30.0–36.0)
MCV: 94.7 fL (ref 78.0–100.0)
Platelets: 274 10*3/uL (ref 150–400)
RBC: 4.33 MIL/uL (ref 4.22–5.81)
RDW: 13.4 % (ref 11.5–15.5)
WBC: 18.6 10*3/uL — AB (ref 4.0–10.5)

## 2015-05-12 LAB — BASIC METABOLIC PANEL
ANION GAP: 10 (ref 5–15)
BUN: 11 mg/dL (ref 6–20)
CHLORIDE: 102 mmol/L (ref 101–111)
CO2: 28 mmol/L (ref 22–32)
Calcium: 8.9 mg/dL (ref 8.9–10.3)
Creatinine, Ser: 1.05 mg/dL (ref 0.61–1.24)
GFR calc Af Amer: >60 mL/min (ref >60)
GFR calc non Af Amer: >60 mL/min (ref >60)
GLUCOSE: 126 mg/dL — AB (ref 65–99)
POTASSIUM: 4.2 mmol/L (ref 3.5–5.1)
Sodium: 140 mmol/L (ref 135–145)

## 2015-05-12 LAB — GLUCOSE, CAPILLARY: GLUCOSE-CAPILLARY: 119 mg/dL — AB (ref 65–99)

## 2015-05-12 LAB — HEPATIC FUNCTION PANEL
ALT: 67 U/L — AB (ref 17–63)
AST: 311 U/L — ABNORMAL HIGH (ref 15–41)
Albumin: 2.9 g/dL — ABNORMAL LOW (ref 3.5–5.0)
Alkaline Phosphatase: 43 U/L (ref 38–126)
BILIRUBIN DIRECT: 0.2 mg/dL (ref 0.1–0.5)
BILIRUBIN INDIRECT: 1.1 mg/dL — AB (ref 0.3–0.9)
TOTAL PROTEIN: 6.2 g/dL — AB (ref 6.5–8.1)
Total Bilirubin: 1.3 mg/dL — ABNORMAL HIGH (ref 0.3–1.2)

## 2015-05-12 NOTE — Progress Notes (Signed)
CARDIAC REHAB PHASE I   PRE:  Rate/Rhythm: 75 SR  BP:  Supine:   Sitting: 87/45  Standing:    SaO2:   MODE:  Ambulation: 350 ft   POST:  Rate/Rhythm: 98 SR  BP:  Supine:   Sitting: 107/41  Standing:    SaO2:  1005-1115 Pt walked 350 ft with steady gait. Tolerated well. No CP. MI and CHF ed completed with pt and wife who voiced understanding. Needs ex ed tomorrow. Discussed CRP2 and will refer to [place]. Pt understands that if he needs staged intervention then CRP 2 will be delayed until after procedure. Discussed smoking cessation and handout given. Pt plans to quit cold [place]. Discussed daily weights, low sodium diet, CHF signs of when to call MD with low EF. Pt has A1C of 6 and discussed watching carbs. Pt also knows he is to watch fat and cholesterol.  Encouraged pt to do CRP 2 as we have dietitan that can help pt with his diets.    Graylon Good, RN BSN  05/12/2015 11:26 AM

## 2015-05-12 NOTE — Progress Notes (Signed)
Patient Name: Damon Walker Date of Encounter: 05/12/2015  Principal Problem:   ST elevation (STEMI) myocardial infarction involving left anterior descending coronary artery ([place]) Active Problems:   HTN (hypertension)   HLD (hyperlipidemia)   Tobacco abuse   Carotid arterial disease (HCC)   ST elevation myocardial infarction (STEMI) involving left anterior descending (LAD) coronary artery with complication ([place])   Length of Stay: 1  SUBJECTIVE  No angina, but has had some hypotension after cardiac meds. Lengthy runs of nonsustained monomorphic VT yesterday, 5-beat NSVT earlier this AM. No dyspnea at rest. No angina.  CURRENT MEDS . aspirin  81 mg Oral Daily  . carvedilol  6.25 mg Oral BID WC  . enoxaparin (LOVENOX) injection  40 mg Subcutaneous Q24H  . lisinopril  20 mg Oral Daily  . pantoprazole  40 mg Oral BID  . rosuvastatin  40 mg Oral q1800  . sodium chloride  3 mL Intravenous Q12H  . ticagrelor  90 mg Oral BID    OBJECTIVE   Intake/Output Summary (Last 24 hours) at 05/12/15 1039 Last data filed at 05/12/15 0800  Gross per 24 hour  Intake   1360 ml  Output   1475 ml  Net   -115 ml   Filed Weights   05/11/15 0343 05/12/15 0600  Weight: 200 lb (90.719 kg) 195 lb 1.7 oz (88.5 kg)    PHYSICAL EXAM Filed Vitals:   05/12/15 0722 05/12/15 0800 05/12/15 1000 05/12/15 1014  BP:  119/53 80/40 87/47   Pulse:  77    Temp: 98.6 F (37 C)     TempSrc: Oral     Resp:  12    Height:      Weight:      SpO2:  98% 97%    General: Alert, oriented x3, no distress Head: no evidence of trauma, PERRL, EOMI, no exophtalmos or lid lag, no myxedema, no xanthelasma; normal ears, nose and oropharynx Neck: normal jugular venous pulsations and no hepatojugular reflux; brisk carotid pulses without delay and no carotid bruits Chest: clear to auscultation, no signs of consolidation by percussion or palpation, normal fremitus, symmetrical and full respiratory  excursions Cardiovascular: normal position and quality of the apical impulse, regular rhythm, normal first and second heart sounds, no rubs or gallops, no murmur Abdomen: no tenderness or distention, no masses by palpation, no abnormal pulsatility or arterial bruits, normal bowel sounds, no hepatosplenomegaly Extremities: no clubbing, cyanosis or edema; 2+ radial, ulnar and brachial pulses bilaterally; 2+ right femoral, posterior tibial and dorsalis pedis pulses; 2+ left femoral, posterior tibial and dorsalis pedis pulses; no subclavian or femoral bruits Neurological: grossly nonfocal  LABS  CBC  Recent Labs  05/11/15 0630 05/12/15 0236  WBC 16.8* 18.6*  HGB 14.8 13.6  HCT 43.9 41.0  MCV 93.0 94.7  PLT 290 370   Basic Metabolic Panel  Recent Labs  05/11/15 0630 05/12/15 0236  NA 136 140  K 3.9 4.2  CL 100* 102  CO2 24 28  GLUCOSE 138* 126*  BUN 8 11  CREATININE 1.00 1.05  CALCIUM 8.7* 8.9  MG 2.0  --    Liver Function Tests  Recent Labs  05/11/15 0630 05/12/15 0236  AST 296* 311*  ALT 52 67*  ALKPHOS 53 43  BILITOT 0.8 1.3*  PROT 6.6 6.2*  ALBUMIN 3.4* 2.9*   No results for input(s): LIPASE, AMYLASE in the last 72 hours. Cardiac Enzymes  Recent Labs  05/11/15 0630 05/11/15 1058 05/11/15 1650  TROPONINI >65.00* >65.00* >  65.00*   BNP Invalid input(s): POCBNP D-Dimer No results for input(s): DDIMER in the last 72 hours. Hemoglobin A1C  Recent Labs  05/11/15 0630  HGBA1C 6.0*   Fasting Lipid Panel  Recent Labs  05/12/15 0236  CHOL 252*  HDL 33*  LDLCALC 186*  TRIG 165*  CHOLHDL 7.6   Thyroid Function Tests  Recent Labs  05/11/15 0630  TSH 1.006    Radiology Studies Imaging results have been reviewed and Dg Chest 1 View  05/11/2015   CLINICAL DATA:  Myocardial infarction.  History of smoking.  EXAM: CHEST 1 VIEW  COMPARISON:  Chest radiograph 12/14/2014  FINDINGS: Increased interstitial densities throughout both lungs. Findings are  suggestive for interstitial pulmonary edema. The heart size is normal. Trachea is midline. Negative for a pneumothorax. No large pleural effusions.  IMPRESSION: Increased interstitial densities in both lungs. Findings are suggestive for interstitial pulmonary edema.   Electronically Signed   By: Markus Daft M.D.   On: 05/11/2015 09:36    TELE NSR now, nonsustained VT (monomorphic) earlier  ECG NSR, deep broad inverted T waves with very long QT, suggesting extensive anterior wall injury and stunning.  ASSESSMENT AND PLAN   1. Large anterior STEMI due to proximal LAD occlusion s/p DES x2 Extensive ECG changes and severe QT prolongation. Brief NSVT >24h after presentation. Low BP. Keep in ICU one more day. Discussed mandatory DAPT.  2. Moderate to severe LV dysfunction, EF 40% by cath Echo today. ACEi and carvedilol. Will not titrate up due to BP. Hopefully will see some improvement with resolution of myocardial stunning. Discussed sodium restriction, daily weight, potential for CHF and signs/symptoms of fluid overload.  3. Multivessel CAD Also has severe stenoses in RCA and OM2, no plan for PCI at this time, unless symptoms develop.  4. Severe hyperlipidemia Previously on lipitor, stopped due to eczema (True relationship?). Statin restarted.  5. Borderline DM Diet, exercise reviewed  6. Abnormal LFTs Most of elevation in AST likely due to MI, but ALT also slightly high. He does not drink alcohol. Monitor.   7. Tobacco abuse. Smoking cessation counseling.  8. Bilateral moderate carotid arterial disease. Asymptomatic.   9. HTN -preferential use of ACEi and beta blocker, max tolerated doses.  Sanda Klein, MD, [facility] [phone] office [phone] pager 05/12/2015 10:39 AM

## 2015-05-12 NOTE — Progress Notes (Signed)
Pt states no ins for meds. He gets some meds from [place]. Placed pt assist form on shadow chart for md to sign. Pt aware will need to send in w proof of income.

## 2015-05-13 ENCOUNTER — Inpatient Hospital Stay (HOSPITAL_COMMUNITY): Payer: Medicare Other

## 2015-05-13 DIAGNOSIS — I2510 Atherosclerotic heart disease of native coronary artery without angina pectoris: Secondary | ICD-10-CM

## 2015-05-13 MED ORDER — POLYETHYLENE GLYCOL 3350 17 G PO PACK
17.0000 g | PACK | Freq: Every day | ORAL | Status: DC
  Administered 2015-05-13: 17 g via ORAL
  Filled 2015-05-13: qty 1

## 2015-05-13 MED ORDER — LISINOPRIL 5 MG PO TABS: 5.0000 mg | ORAL_TABLET | Freq: Every day | ORAL | Status: DC

## 2015-05-13 MED ORDER — LISINOPRIL 5 MG PO TABS
5.0000 mg | ORAL_TABLET | Freq: Every day | ORAL | Status: DC
  Administered 2015-05-13 – 2015-05-14 (×2): 5 mg via ORAL
  Filled 2015-05-13 (×2): qty 1

## 2015-05-13 MED ORDER — CARVEDILOL 6.25 MG PO TABS
9.3750 mg | ORAL_TABLET | Freq: Two times a day (BID) | ORAL | Status: DC
  Administered 2015-05-13 – 2015-05-14 (×2): 9.375 mg via ORAL
  Filled 2015-05-13 (×4): qty 1

## 2015-05-13 MED ORDER — LISINOPRIL 10 MG PO TABS: 10.0000 mg | ORAL_TABLET | Freq: Every day | ORAL | Status: DC

## 2015-05-13 NOTE — Plan of Care (Signed)
Problem: Phase I Progression Outcomes Goal: Hemodynamically stable Outcome: Progressing Patient denies any pain or discomfort at this time.

## 2015-05-13 NOTE — Progress Notes (Signed)
PATIENT ARRIVED TO UNIT 2W-11 FROM 2H-6 VIA WHEELCHAIR.

## 2015-05-13 NOTE — Progress Notes (Signed)
CARDIAC REHAB PHASE I   PRE:  Rate/Rhythm: 72  BP:  Sitting: 100/52     SaO2: 97  MODE:  Ambulation: 700 ft   POST:  Rate/Rhythm: 80  BP:  Sitting: 122/50     SaO2: 98  9:50am-10:32am Patient ambulated at a good speed independently. Patient did not have complaints of pain or sob. Patient did complain of pain in "butt cheeks" while walking. Patient encouraged to get up and walk again later this evening. Nurse made aware of pain in buttocks. Patient educated on what to do for exercise when he goes home and we talked more about attending cardiac rehab upon discharge.   [name], [place], [place] 05/13/2015 10:30 AM

## 2015-05-13 NOTE — Progress Notes (Signed)
  Echocardiogram 2D Echocardiogram has been performed.  Damon Walker 05/13/2015, 11:57 AM

## 2015-05-13 NOTE — Progress Notes (Signed)
Patient Name: Jevaughn Degollado Date of Encounter: 05/13/2015  Principal Problem:   ST elevation (STEMI) myocardial infarction involving left anterior descending coronary artery (HCC) Active Problems:   HTN (hypertension)   HLD (hyperlipidemia)   Tobacco abuse   Carotid arterial disease (HCC)   ST elevation myocardial infarction (STEMI) involving left anterior descending (LAD) coronary artery with complication [facility])    Primary Cardiologist: Dr. Ellyn Hack Patient Profile: 69 yo male w/ PMH of HTN, HLD, Carotid artery disease, and tobacco abuse who presented with an anterior STEMI on 05/11/2015. Stenting of the proximal to mid LAD with DES x 2. Noted to have severe disease in the proximal RCA, mid RCA, and small branches of the LCx.  SUBJECTIVE: Feeling well. Denies any chest pain or shortness of breath. Reports no difficulty walking with cardiac rehab.   OBJECTIVE Filed Vitals:   05/12/15 2124 05/12/15 2334 05/13/15 0441 05/13/15 0442  BP: 114/65 127/63 107/47   Pulse:  71 69   Temp:  98.4 F (36.9 C) 99 F (37.2 C)   TempSrc:  Oral Oral   Resp:  18 17   Height:      Weight:    195 lb 8.8 oz (88.7 kg)  SpO2:  96% 99%     Intake/Output Summary (Last 24 hours) at 05/13/15 0740 Last data filed at 05/12/15 1500  Gross per 24 hour  Intake    480 ml  Output    550 ml  Net    -70 ml   Filed Weights   05/11/15 0343 05/12/15 0600 05/13/15 0442  Weight: 200 lb (90.719 kg) 195 lb 1.7 oz (88.5 kg) 195 lb 8.8 oz (88.7 kg)    PHYSICAL EXAM General: Well developed, well nourished, male in no acute distress. Head: Normocephalic, atraumatic.  Neck: Supple without bruits, JVD not elevated. Lungs:  Resp regular and unlabored, CTA without wheezing or rales. Heart: RRR, S1, S2, no S3, S4, or murmur; no rub. Abdomen: Soft, non-tender, non-distended with normoactive bowel sounds. No hepatomegaly. No rebound/guarding. No obvious abdominal masses. Extremities: No clubbing, cyanosis, or  edema. Distal pedal pulses are 2+ bilaterally. Right radial cath site without tenderness or ecchymosis. Neuro: Alert and oriented X 3. Moves all extremities spontaneously. Psych: Normal affect.   LABS: CBC: Recent Labs  05/11/15 0630 05/12/15 0236  WBC 16.8* 18.6*  HGB 14.8 13.6  HCT 43.9 41.0  MCV 93.0 94.7  PLT 290 274   INR: Recent Labs  05/11/15 0344  INR 8.41   Basic Metabolic Panel: Recent Labs  05/11/15 0630 05/12/15 0236  NA 136 140  K 3.9 4.2  CL 100* 102  CO2 24 28  GLUCOSE 138* 126*  BUN 8 11  CREATININE 1.00 1.05  CALCIUM 8.7* 8.9  MG 2.0  --    Liver Function Tests: Recent Labs  05/11/15 0630 05/12/15 0236  AST 296* 311*  ALT 52 67*  ALKPHOS 53 43  BILITOT 0.8 1.3*  PROT 6.6 6.2*  ALBUMIN 3.4* 2.9*   Cardiac Enzymes: Recent Labs  05/11/15 0630 05/11/15 1058 05/11/15 1650  TROPONINI >65.00* >65.00* >65.00*    Recent Labs  05/11/15 0347  TROPIPOC 0.15*   BNP:  B NATRIURETIC PEPTIDE  Date/Time Value Ref Range Status  05/11/2015 06:30 AM 104.1* 0.0 - 100.0 pg/mL Final   Hemoglobin A1C: Recent Labs  05/11/15 0630  HGBA1C 6.0*   Fasting Lipid Panel: Recent Labs  05/12/15 0236  CHOL 252*  HDL 33*  LDLCALC 186*  TRIG 165*  CHOLHDL 7.6   Thyroid Function Tests: Recent Labs  05/11/15 0630  TSH 1.006   TELE: NSR with rate in 60's - 70's. 10 beats of asymptomatic VT at 1700 on 05/12/2015.       Cardiac Catheterization: 05/11/2015  Prox RCA lesion, 90% stenosed.  Mid RCA lesion, 80% stenosed.  Dist RCA lesion, 40% stenosed.  Ost 2nd Mrg to 2nd Mrg lesion, 45% stenosed.  3rd Mrg lesion, 80% stenosed.  There is moderate left ventricular systolic dysfunction.  Prox LAD lesion, 100% stenosed. Post intervention, there is a 0% residual stenosis.  Lat 2nd Mrg lesion, 80% stenosed.  1. 3 vessel obstructive CAD. The proximal LAD is occluded and is the culprit vessel. Severe disease in the proximal and mid RCA. There  is also severe disease in the small branches of the LCx. 2. Moderate LV dysfunction. EF estimated at 40%. 3. Elevated LV EDP 4. Successful stenting of the proximal to mid LAD with DES x 2.  Plan: DAPT indefinitely. Patient is high risk and also has PAD. Aggressive risk factor modification. Will continue IV cangrelor for 2 hours and re-administer loading dose of Brilinta. Will need to consider intervention of the RCA depending on clinical course. This is a diffusely diseased artery and will require extensive stenting.   Radiology/Studies: Dg Chest 1 View: 05/11/2015   CLINICAL DATA:  Myocardial infarction.  History of smoking.  EXAM: CHEST 1 VIEW  COMPARISON:  Chest radiograph 12/14/2014  FINDINGS: Increased interstitial densities throughout both lungs. Findings are suggestive for interstitial pulmonary edema. The heart size is normal. Trachea is midline. Negative for a pneumothorax. No large pleural effusions.  IMPRESSION: Increased interstitial densities in both lungs. Findings are suggestive for interstitial pulmonary edema.   Electronically Signed   By: Markus Daft M.D.   On: 05/11/2015 09:36    Current Medications:  . aspirin  81 mg Oral Daily  . carvedilol  6.25 mg Oral BID WC  . enoxaparin (LOVENOX) injection  40 mg Subcutaneous Q24H  . lisinopril  20 mg Oral Daily  . pantoprazole  40 mg Oral BID  . rosuvastatin  40 mg Oral q1800  . sodium chloride  3 mL Intravenous Q12H  . ticagrelor  90 mg Oral BID      ASSESSMENT AND PLAN:  1. Anterior STEMI  - due to proximal LAD occlusion s/p DES x2 on 05/11/2015 - Extensive ECG changes and severe QT prolongation. Brief NSVT >24h after presentation.  - continue ASA, BB, ACE-I, Statin, and Brilinta.  2. Moderate to severe LV dysfunction - EF 40% by cath. New echocardiogram is pending - Continue ACE-I and BB.  Due to labile BP, no room to increase doses.   3. Multivessel CAD - Also has severe stenoses in RCA and OM2, no plan for PCI at this  time, unless symptoms develop.  4. Severe hyperlipidemia - LDL 186, HDL 33, Triglycerides 165, Total Cholesterol 252 on 05/12/2015. - continue statin therapy  5. Borderline DM - A1c 6.0 - Diet and exercise reviewed  6. Abnormal LFTs - AST 311, ALT 67 on 05/12/2015. - Most of elevation in AST likely due to MI, but ALT also slightly high.   7. Tobacco abuse - Smoking cessation counseling.  8. Bilateral moderate carotid arterial disease.  - Asymptomatic.   9. HTN - BP has been 80/40 - 127/65 in the past 24 hours. - will decrease Lisinopril from 20mg  to 10mg  daily in setting of hypotension.   Signed, [place]  Wynelle Fanny , PA-C 7:40 AM 05/13/2015 Pager: [phone]  Patient seen and discussed with PA Ahmed Prima, I agree with her documentation above. Admitted with anterior STEMI, s/p DES x 2 to the LAD. Echo is pending but LVEF 40% by cath. Medical therapy with coreg, lisinopril,crestor, ASA, brilinta. Yesterday AM had some low bp's but these have improved, lisionpril has been decreased. Post MI had run of 23 beat run of NSVT, beta blocker increased. 10 beat run of NSVT last night, will increase coreg to 9.375mg  bid and decrease lisinopril further to 5mg  daily.  Leukocytosis probably related to stress and MI, no fevers and no symptoms. Patient with residual RCA disease and OM2 disease, plan to treat medically at this time, if persistent symptoms consider intervention at that time. Elevated LFTs, will repeat labs tomorrow AM.  Constipation, will start miralax. Will monitor today, potential discharge tomorrow   Zandra Abts MD

## 2015-05-14 ENCOUNTER — Encounter (HOSPITAL_COMMUNITY): Payer: Self-pay | Admitting: Physician Assistant

## 2015-05-14 ENCOUNTER — Encounter (HOSPITAL_COMMUNITY): Payer: Self-pay | Admitting: *Deleted

## 2015-05-14 ENCOUNTER — Emergency Department (HOSPITAL_COMMUNITY): Payer: Medicare Other

## 2015-05-14 ENCOUNTER — Inpatient Hospital Stay (HOSPITAL_COMMUNITY)
Admission: EM | Admit: 2015-05-14 | Discharge: 2015-05-18 | Disposition: A | Discharge status: Rehabilitation Facility | Payer: Medicare Other | Source: Home / Self Care | Attending: Neurology | Admitting: Neurology

## 2015-05-14 DIAGNOSIS — K922 Gastrointestinal hemorrhage, unspecified: Secondary | ICD-10-CM | POA: Diagnosis not present

## 2015-05-14 DIAGNOSIS — I10 Essential (primary) hypertension: Secondary | ICD-10-CM | POA: Diagnosis present

## 2015-05-14 DIAGNOSIS — Z955 Presence of coronary angioplasty implant and graft: Secondary | ICD-10-CM

## 2015-05-14 DIAGNOSIS — I63532 Cerebral infarction due to unspecified occlusion or stenosis of left posterior cerebral artery: Secondary | ICD-10-CM

## 2015-05-14 DIAGNOSIS — I2109 ST elevation (STEMI) myocardial infarction involving other coronary artery of anterior wall: Secondary | ICD-10-CM | POA: Diagnosis present

## 2015-05-14 DIAGNOSIS — I2510 Atherosclerotic heart disease of native coronary artery without angina pectoris: Secondary | ICD-10-CM | POA: Diagnosis present

## 2015-05-14 DIAGNOSIS — I63531 Cerebral infarction due to unspecified occlusion or stenosis of right posterior cerebral artery: Secondary | ICD-10-CM

## 2015-05-14 DIAGNOSIS — I639 Cerebral infarction, unspecified: Secondary | ICD-10-CM | POA: Diagnosis present

## 2015-05-14 DIAGNOSIS — Z87891 Personal history of nicotine dependence: Secondary | ICD-10-CM | POA: Diagnosis present

## 2015-05-14 HISTORY — DX: Cerebral infarction due to unspecified occlusion or stenosis of left posterior cerebral artery: I63.532

## 2015-05-14 LAB — CBC
HEMATOCRIT: 37 % — AB (ref 39.0–52.0)
HEMATOCRIT: 38.3 % — AB (ref 39.0–52.0)
HEMOGLOBIN: 12.7 g/dL — AB (ref 13.0–17.0)
Hemoglobin: 12.4 g/dL — ABNORMAL LOW (ref 13.0–17.0)
MCH: 31.8 pg (ref 26.0–34.0)
MCH: 31.1 pg (ref 26.0–34.0)
MCHC: 33.5 g/dL (ref 30.0–36.0)
MCHC: 33.2 g/dL (ref 30.0–36.0)
MCV: 94.9 fL (ref 78.0–100.0)
MCV: 93.6 fL (ref 78.0–100.0)
PLATELETS: 241 10*3/uL (ref 150–400)
Platelets: 262 10*3/uL (ref 150–400)
RBC: 3.9 MIL/uL — AB (ref 4.22–5.81)
RBC: 4.09 MIL/uL — AB (ref 4.22–5.81)
RDW: 13.2 % (ref 11.5–15.5)
RDW: 13.1 % (ref 11.5–15.5)
WBC: 12.3 10*3/uL — AB (ref 4.0–10.5)
WBC: 12 10*3/uL — AB (ref 4.0–10.5)

## 2015-05-14 LAB — COMPREHENSIVE METABOLIC PANEL
ALK PHOS: 43 U/L (ref 38–126)
ALT: 35 U/L (ref 17–63)
ALT: 37 U/L (ref 17–63)
ANION GAP: 9 (ref 5–15)
AST: 77 U/L — AB (ref 15–41)
AST: 69 U/L — ABNORMAL HIGH (ref 15–41)
Albumin: 2.9 g/dL — ABNORMAL LOW (ref 3.5–5.0)
Albumin: 3.3 g/dL — ABNORMAL LOW (ref 3.5–5.0)
Alkaline Phosphatase: 41 U/L (ref 38–126)
Anion gap: 9 (ref 5–15)
BUN: 11 mg/dL (ref 6–20)
BUN: 14 mg/dL (ref 6–20)
CALCIUM: 8.8 mg/dL — AB (ref 8.9–10.3)
CHLORIDE: 101 mmol/L (ref 101–111)
CO2: 28 mmol/L (ref 22–32)
CO2: 25 mmol/L (ref 22–32)
Calcium: 8.9 mg/dL (ref 8.9–10.3)
Chloride: 100 mmol/L — ABNORMAL LOW (ref 101–111)
Creatinine, Ser: 1.09 mg/dL (ref 0.61–1.24)
Creatinine, Ser: 1.08 mg/dL (ref 0.61–1.24)
Glucose, Bld: 109 mg/dL — ABNORMAL HIGH (ref 65–99)
Glucose, Bld: 120 mg/dL — ABNORMAL HIGH (ref 65–99)
POTASSIUM: 4.4 mmol/L (ref 3.5–5.1)
Potassium: 4 mmol/L (ref 3.5–5.1)
SODIUM: 134 mmol/L — AB (ref 135–145)
Sodium: 138 mmol/L (ref 135–145)
TOTAL PROTEIN: 6.1 g/dL — AB (ref 6.5–8.1)
Total Bilirubin: 1 mg/dL (ref 0.3–1.2)
Total Bilirubin: 1.1 mg/dL (ref 0.3–1.2)
Total Protein: 6.8 g/dL (ref 6.5–8.1)

## 2015-05-14 LAB — HEMOGLOBIN AND HEMATOCRIT, BLOOD
HCT: 33.4 % — ABNORMAL LOW (ref 39.0–52.0)
Hemoglobin: 11.3 g/dL — ABNORMAL LOW (ref 13.0–17.0)

## 2015-05-14 LAB — APTT: APTT: 27 s (ref 24–37)

## 2015-05-14 LAB — I-STAT CHEM 8, ED
BUN: 16 mg/dL (ref 6–20)
CHLORIDE: 102 mmol/L (ref 101–111)
CREATININE: 1.1 mg/dL (ref 0.61–1.24)
Calcium, Ion: 1.13 mmol/L (ref 1.13–1.30)
GLUCOSE: 119 mg/dL — AB (ref 65–99)
HEMATOCRIT: 40 % (ref 39.0–52.0)
Hemoglobin: 13.6 g/dL (ref 13.0–17.0)
POTASSIUM: 4 mmol/L (ref 3.5–5.1)
Sodium: 137 mmol/L (ref 135–145)
TCO2: 23 mmol/L (ref 0–100)

## 2015-05-14 LAB — URINALYSIS, ROUTINE W REFLEX MICROSCOPIC
Bilirubin Urine: NEGATIVE
GLUCOSE, UA: NEGATIVE mg/dL
KETONES UR: 15 mg/dL — AB
LEUKOCYTES UA: NEGATIVE
NITRITE: NEGATIVE
PROTEIN: NEGATIVE mg/dL
Specific Gravity, Urine: 1.015 (ref 1.005–1.030)
UROBILINOGEN UA: 2 mg/dL — AB (ref 0.0–1.0)
pH: 6.5 (ref 5.0–8.0)

## 2015-05-14 LAB — DIFFERENTIAL
BASOS ABS: 0.1 10*3/uL (ref 0.0–0.1)
BASOS PCT: 1 %
EOS ABS: 0.3 10*3/uL (ref 0.0–0.7)
Eosinophils Relative: 3 %
LYMPHS ABS: 2.2 10*3/uL (ref 0.7–4.0)
Lymphocytes Relative: 18 %
Monocytes Absolute: 1.2 10*3/uL — ABNORMAL HIGH (ref 0.1–1.0)
Monocytes Relative: 10 %
NEUTROS ABS: 8.3 10*3/uL — AB (ref 1.7–7.7)
NEUTROS PCT: 68 %

## 2015-05-14 LAB — RAPID URINE DRUG SCREEN, HOSP PERFORMED
Amphetamines: NOT DETECTED
BARBITURATES: NOT DETECTED
BENZODIAZEPINES: NOT DETECTED
Cocaine: NOT DETECTED
Opiates: NOT DETECTED
Tetrahydrocannabinol: NOT DETECTED

## 2015-05-14 LAB — URINE MICROSCOPIC-ADD ON

## 2015-05-14 LAB — I-STAT TROPONIN, ED: TROPONIN I, POC: 28.16 ng/mL — AB (ref 0.00–0.08)

## 2015-05-14 LAB — GLUCOSE, CAPILLARY
GLUCOSE-CAPILLARY: 106 mg/dL — AB (ref 65–99)
GLUCOSE-CAPILLARY: 105 mg/dL — AB (ref 65–99)
Glucose-Capillary: 104 mg/dL — ABNORMAL HIGH (ref 65–99)
Glucose-Capillary: 119 mg/dL — ABNORMAL HIGH (ref 65–99)

## 2015-05-14 LAB — ETHANOL: Alcohol, Ethyl (B): <5 mg/dL (ref <5)

## 2015-05-14 LAB — MRSA PCR SCREENING: MRSA by PCR: NEGATIVE

## 2015-05-14 LAB — PROTIME-INR
INR: 1.11 (ref 0.00–1.49)
Prothrombin Time: 14.5 seconds (ref 11.6–15.2)

## 2015-05-14 MED ORDER — SODIUM CHLORIDE 0.9 % IV BOLUS (SEPSIS)
500.0000 mL | Freq: Once | INTRAVENOUS | Status: AC
  Administered 2015-05-14: 500 mL via INTRAVENOUS

## 2015-05-14 MED ORDER — PANTOPRAZOLE SODIUM 40 MG IV SOLR
40.0000 mg | Freq: Every day | INTRAVENOUS | Status: DC
  Administered 2015-05-14: 40 mg via INTRAVENOUS

## 2015-05-14 MED ORDER — ROSUVASTATIN CALCIUM 40 MG PO TABS: 40.0000 mg | ORAL_TABLET | Freq: Every day | ORAL | Status: DC

## 2015-05-14 MED ORDER — FUROSEMIDE 20 MG PO TABS: 20.0000 mg | ORAL_TABLET | ORAL | Status: DC | PRN

## 2015-05-14 MED ORDER — LABETALOL HCL 5 MG/ML IV SOLN: 10.0000 mg | INTRAVENOUS | Status: DC | PRN

## 2015-05-14 MED ORDER — ACETAMINOPHEN 650 MG RE SUPP: 650.0000 mg | RECTAL | Status: DC | PRN

## 2015-05-14 MED ORDER — TICAGRELOR 90 MG PO TABS
90.0000 mg | ORAL_TABLET | Freq: Two times a day (BID) | ORAL | Status: DC
Start: 2015-05-14 — End: 2015-05-26

## 2015-05-14 MED ORDER — ASPIRIN 81 MG PO CHEW: 81.0000 mg | CHEWABLE_TABLET | Freq: Every day | ORAL | Status: DC

## 2015-05-14 MED ORDER — SENNOSIDES-DOCUSATE SODIUM 8.6-50 MG PO TABS: 1.0000 | ORAL_TABLET | Freq: Every evening | ORAL | Status: DC | PRN

## 2015-05-14 MED ORDER — ACETAMINOPHEN 325 MG PO TABS
650.0000 mg | ORAL_TABLET | ORAL | Status: DC | PRN
  Administered 2015-05-16: 650 mg via ORAL
  Filled 2015-05-14: qty 2

## 2015-05-14 MED ORDER — LISINOPRIL 5 MG PO TABS: 5.0000 mg | ORAL_TABLET | Freq: Every day | ORAL | Status: DC

## 2015-05-14 MED ORDER — NITROGLYCERIN 0.4 MG SL SUBL: 0.4000 mg | SUBLINGUAL_TABLET | SUBLINGUAL | Status: DC | PRN

## 2015-05-14 MED ORDER — STROKE: EARLY STAGES OF RECOVERY BOOK
Freq: Once | Status: AC
  Administered 2015-05-14: 15:00:00
  Filled 2015-05-14: qty 1

## 2015-05-14 MED ORDER — ALTEPLASE (STROKE) FULL DOSE INFUSION
0.9000 mg/kg | Freq: Once | INTRAVENOUS | Status: AC
  Administered 2015-05-14: 79 mg via INTRAVENOUS
  Filled 2015-05-14: qty 79

## 2015-05-14 MED ORDER — SODIUM CHLORIDE 0.9 % IV SOLN
INTRAVENOUS | Status: DC
  Administered 2015-05-14: via INTRAVENOUS
  Administered 2015-05-15: 75 mL/h via INTRAVENOUS
  Administered 2015-05-18: 10:00:00 via INTRAVENOUS

## 2015-05-14 MED ORDER — TICAGRELOR 90 MG PO TABS: 90.0000 mg | ORAL_TABLET | Freq: Two times a day (BID) | ORAL | Status: DC

## 2015-05-14 MED ORDER — CARVEDILOL 3.125 MG PO TABS: 9.3750 mg | ORAL_TABLET | Freq: Two times a day (BID) | ORAL | Status: DC

## 2015-05-14 NOTE — Progress Notes (Signed)
Order received for speech/language evaluation.  Note pt passed RN stroke swallow screen.   Will complete speech/language evaluation as ordered.  Luanna Salk, [facility] SLP [phone]

## 2015-05-14 NOTE — ED Notes (Signed)
Wife states that patient is not his norm, patient is following all directions at this time GCS 15

## 2015-05-14 NOTE — ED Notes (Signed)
Patient to room with stroke team and edp, patient placed in gown and on monitor. On exam at this time patient with right sided visual deficit.

## 2015-05-14 NOTE — ED Notes (Signed)
MRI canceled, patient to receive TPA

## 2015-05-14 NOTE — ED Notes (Signed)
Patient to MRI with RN.

## 2015-05-14 NOTE — ED Provider Notes (Addendum)
CSN: [id number]     Arrival date & time 05/14/15  1353 History   First MD Initiated Contact with Patient 05/14/15 1410     Chief Complaint  Patient presents with  . Blurred Vision    L5 caveat acuity of situation code stroke. History is obtained from patient and patient's wife (Consider location/radiation/quality/duration/timing/severity/associated sxs/prior Treatment) HPI Developed sudden onset blurred vision 12 noon today. His wife reports that he was also confused for approximately 10 minutes. Patient presently feels normal now except for generalized weakness. He states his vision is normal. No treatment prior to coming here. Past Medical History  Diagnosis Date  . Untreated Hypertension   . Tobacco abuse     a. 30 yrs - 1.5 ppd.  Marland Kitchen HLD (hyperlipidemia)   . Carotid arterial disease ([place])   . CAD (coronary artery disease)     Cath 05/11/2015 three-vessel CAD with 90% prox RCA, 80% mid RCA, 80% OM3, 100% prox LAD occlusion treated with DESx2. RCA and OM residual treated medically for now, however will need PCI if has recurrent CP  . Prediabetes     A1C 6.0 in Oct 2016  . Ischemic cardiomyopathy     EF 40% on cath 05/11/2015 after anterior STEMI, EF 20-25% on echo 05/13/2015   Past Surgical History  Procedure Laterality Date  . S/p appendectomy      Age 22  . S/p inguinal hernia repair      In his 20's.  . Cardiac catheterization N/A 05/11/2015    Procedure: Left Heart Cath and Coronary Angiography;  Surgeon: Peter M Martinique, MD;  Location: [facility];  Service: Cardiovascular;  Laterality: N/A;  . Cardiac catheterization N/A 05/11/2015    Procedure: Coronary Stent Intervention;  Surgeon: Peter M Martinique, MD;  Location: [facility];  Service: Cardiovascular;  Laterality: N/A;   Family History  Problem Relation Age of Onset  . Adopted: Yes  . Other      Adopted - unaware of biological parent's histories.  . Other Mother     eczema   . Cancer Mother    Social  History  Substance Use Topics  . Smoking status: Current Every Day Smoker -- 1.00 packs/day for 30 years    Types: Cigarettes  . Smokeless tobacco: None  . Alcohol Use: No    Review of Systems  Unable to perform ROS: Acuity of condition  Eyes: Positive for visual disturbance.  Neurological:       Confusion      Allergies  Latex  Home Medications   Prior to Admission medications   Medication Sig Start Date End Date Taking? Authorizing Provider  aspirin 81 MG chewable tablet Chew 1 tablet (81 mg total) by mouth daily. 05/14/15   Almyra Deforest, PA  carvedilol (COREG) 3.125 MG tablet Take 3 tablets (9.375 mg total) by mouth 2 (two) times daily with a meal. 05/14/15   Almyra Deforest, PA  furosemide (LASIX) 20 MG tablet Take 1 tablet (20 mg total) by mouth as needed for fluid or edema (for edema and shortness of breath). 05/14/15   Almyra Deforest, PA  lisinopril (PRINIVIL,ZESTRIL) 5 MG tablet Take 1 tablet (5 mg total) by mouth daily. 05/14/15   Almyra Deforest, PA  nitroGLYCERIN (NITROSTAT) 0.4 MG SL tablet Place 1 tablet (0.4 mg total) under the tongue every 5 (five) minutes x 3 doses as needed for chest pain. 05/14/15   Almyra Deforest, PA  pantoprazole (PROTONIX) 40 MG tablet Take 40 mg  by mouth 2 (two) times daily.    Historical Provider, MD  rosuvastatin (CRESTOR) 40 MG tablet Take 1 tablet (40 mg total) by mouth daily at 6 PM. 05/14/15   Almyra Deforest, PA  ticagrelor (BRILINTA) 90 MG TABS tablet Take 1 tablet (90 mg total) by mouth 2 (two) times daily. 05/14/15   Almyra Deforest, PA   BP 133/66 mmHg  Pulse 71  Temp(Src) 98.3 F (36.8 C) (Oral)  Wt 192 lb 14 oz (87.488 kg)  SpO2 95% Physical Exam  Constitutional: He is oriented to person, place, and time. He appears well-developed and well-nourished.  HENT:  Head: Normocephalic and atraumatic.  Edentulous  Eyes: Conjunctivae and EOM are normal. Pupils are equal, round, and reactive to light.  She'll field cut right side  Neck: Neck supple. No tracheal deviation present.  No thyromegaly present.  Cardiovascular: Normal rate and regular rhythm.   No murmur heard. Pulmonary/Chest: Effort normal and breath sounds normal.  Abdominal: Soft. Bowel sounds are normal. He exhibits no distension. There is no tenderness.  Musculoskeletal: Normal range of motion. He exhibits no edema or tenderness.  Neurological: He is alert and oriented to person, place, and time. Coordination normal.  Moves all extremities well motor strip 5 over 5 overall  Skin: Skin is warm and dry. No rash noted.  Psychiatric: He has a normal mood and affect.  Nursing note and vitals reviewed.   ED Course  Procedures (including critical care time) Labs Review Labs Reviewed  I-STAT CHEM 8, ED - Abnormal; Notable for the following:    Glucose, Bld 119 (*)    All other components within normal limits  ETHANOL  PROTIME-INR  APTT  CBC  DIFFERENTIAL  COMPREHENSIVE METABOLIC PANEL  URINE RAPID DRUG SCREEN, HOSP PERFORMED  URINALYSIS, ROUTINE W REFLEX MICROSCOPIC (NOT AT [facility])  CBG MONITORING, ED  I-STAT TROPOININ, ED    Imaging Review No results found. I have personally reviewed and evaluated these images and lab results as part of my medical decision-making.   EKG Interpretation   Date/Time:  Sunday May 14 2015 14:04:27 EDT Ventricular Rate:  72 PR Interval:  178 QRS Duration: 106 QT Interval:  442 QTC Calculation: 483 R Axis:   -83 Text Interpretation:  Normal sinus rhythm Left axis deviation Incomplete  right bundle branch block Septal infarct , age undetermined Possible  Lateral infarct , age undetermined ST \\T \ T wave abnormality, consider  anterior ischemia Abnormal ECG No significant change since last tracing  Confirmed by Winfred Leeds  MD, Inessa Wardrop [phone]) on 05/14/2015 3:06:29 PM     Code stroke called at triage CT scan result discussed with me by radiologist Dr.Shogry Results for orders placed or performed during the hospital encounter of 05/14/15  Protime-INR  Result  Value Ref Range   Prothrombin Time 14.5 11.6 - 15.2 seconds   INR 1.11 0.00 - 1.49  APTT  Result Value Ref Range   aPTT 27 24 - 37 seconds  CBC  Result Value Ref Range   WBC 12.0 (H) 4.0 - 10.5 K/uL   RBC 4.09 (L) 4.22 - 5.81 MIL/uL   Hemoglobin 12.7 (L) 13.0 - 17.0 g/dL   HCT 38.3 (L) 39.0 - 52.0 %   MCV 93.6 78.0 - 100.0 fL   MCH 31.1 26.0 - 34.0 pg   MCHC 33.2 30.0 - 36.0 g/dL   RDW 13.1 11.5 - 15.5 %   Platelets 262 150 - 400 K/uL  Differential  Result Value Ref Range  Neutrophils Relative % 68 %   Neutro Abs 8.3 (H) 1.7 - 7.7 K/uL   Lymphocytes Relative 18 %   Lymphs Abs 2.2 0.7 - 4.0 K/uL   Monocytes Relative 10 %   Monocytes Absolute 1.2 (H) 0.1 - 1.0 K/uL   Eosinophils Relative 3 %   Eosinophils Absolute 0.3 0.0 - 0.7 K/uL   Basophils Relative 1 %   Basophils Absolute 0.1 0.0 - 0.1 K/uL  I-Stat Chem 8, ED  (not at [facility], [facility])  Result Value Ref Range   Sodium 137 135 - 145 mmol/L   Potassium 4.0 3.5 - 5.1 mmol/L   Chloride 102 101 - 111 mmol/L   BUN 16 6 - 20 mg/dL   Creatinine, Ser 1.10 0.61 - 1.24 mg/dL   Glucose, Bld 119 (H) 65 - 99 mg/dL   Calcium, Ion 1.13 1.13 - 1.30 mmol/L   TCO2 23 0 - 100 mmol/L   Hemoglobin 13.6 13.0 - 17.0 g/dL   HCT 40.0 39.0 - 52.0 %  I-stat troponin, ED (not at [facility], [facility])  Result Value Ref Range   Troponin i, poc 28.16 (HH) 0.00 - 0.08 ng/mL   Comment NOTIFIED PHYSICIAN    Comment 3           Dg Chest 1 View  05/11/2015   CLINICAL DATA:  Myocardial infarction.  History of smoking.  EXAM: CHEST 1 VIEW  COMPARISON:  Chest radiograph 12/14/2014  FINDINGS: Increased interstitial densities throughout both lungs. Findings are suggestive for interstitial pulmonary edema. The heart size is normal. Trachea is midline. Negative for a pneumothorax. No large pleural effusions.  IMPRESSION: Increased interstitial densities in both lungs. Findings are suggestive for interstitial pulmonary edema.   Electronically Signed   By: Markus Daft M.D.    On: 05/11/2015 09:36   Ct Head Wo Contrast  05/14/2015   CLINICAL DATA:  Code stroke. Heart catheterization with subsequent onset of headache and bilateral blurred vision  EXAM: CT HEAD WITHOUT CONTRAST  TECHNIQUE: Contiguous axial images were obtained from the base of the skull through the vertex without intravenous contrast.  COMPARISON:  06/22/2014  FINDINGS: No sign of acute infarction by CT. 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 This is not acute. No hydrocephalus. No hemorrhage. No mass lesion. No calvarial abnormality of an acute nature. Sinuses are clear. There is fluid in the middle ears bilaterally.  IMPRESSION: No acute finding by CT. 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  These results were called by telephone at the time of interpretation on 05/14/2015 at 2:36 pm to Dr. Orlie Dakin, who verbally acknowledged these results.   Electronically Signed   By: Nelson Chimes M.D.   On: 05/14/2015 14:39   255 pm exam unchanged . Pt is alert, gcs 15 MDM  Dr Aram Beecham,, neurology service was present in room when patient returned from CT scan. TPA administered under Dr. Gertha Calkin supervision, as patient was felt to have acutePCA stroke Final diagnoses:  None   Patient will be admitted to neuro ICU  dx Acute stroke CRITICAL CARE Performed by: Orlie Dakin Total critical care time: 30 minute Critical care time was exclusive of separately billable procedures and treating other patients. Critical care was necessary  to treat or prevent imminent or life-threatening deterioration. Critical care was time spent personally by me on the following activities: development of treatment plan  with patient and/or surrogate as well as nursing, discussions with consultants, evaluation of patient's response to treatment, examination of patient, obtaining history from patient or surrogate, ordering and performing treatments and interventions, ordering and review of laboratory studies, ordering and review of radiographic studies, pulse oximetry and re-evaluation of patient's condition.    Orlie Dakin, MD 05/14/15 [place], MD 05/14/15 1511

## 2015-05-14 NOTE — Discharge Summary (Signed)
Discharge Summary   Patient ID: Damon Walker,  MRN: [id number], DOB/AGE: 1945/09/06 69 y.o.  Admit date: 05/11/2015 Discharge date: 05/14/2015  Primary Care Provider: [name] Primary Cardiologist: Dr. Ellyn Hack  Discharge Diagnoses Principal Problem:   ST elevation (STEMI) myocardial infarction involving left anterior descending coronary artery [facility]) Active Problems:   HTN (hypertension)   HLD (hyperlipidemia)   Tobacco abuse   Carotid arterial disease (HCC)   Allergies Allergies  Allergen Reactions  . Latex Itching and Rash    Procedures  Cardiac catheterization 05/11/2015 Conclusion     Prox RCA lesion, 90% stenosed.  Mid RCA lesion, 80% stenosed.  Dist RCA lesion, 40% stenosed.  Ost 2nd Mrg to 2nd Mrg lesion, 45% stenosed.  3rd Mrg lesion, 80% stenosed.  There is moderate left ventricular systolic dysfunction.  Prox LAD lesion, 100% stenosed. Post intervention, there is a 0% residual stenosis.  Lat 2nd Mrg lesion, 80% stenosed.  1. 3 vessel obstructive CAD. The proximal LAD is occluded and is the culprit vessel. Severe disease in the proximal and mid RCA. There is also severe disease in the small branches of the LCx. 2. Moderate LV dysfunction. EF estimated at 40%. 3. Elevated LV EDP 4. Successful stenting of the proximal to mid LAD with DES x 2.  Plan: DAPT indefinitely. Patient is high risk and also has PAD. Aggressive risk factor modification. Will continue IV cangrelor for 2 hours and re-administer loading dose of Brilinta. Will need to consider intervention of the RCA depending on clinical course. This is a diffusely diseased artery and will require extensive stenting.      Echocardiogram 05/13/2015 LV EF: 20% -  25%  ------------------------------------------------------------------- Indications:   CAD of native vessels 414.01.  ------------------------------------------------------------------- History:  Risk factors: Current  tobacco use. Hypertension. Dyslipidemia.  ------------------------------------------------------------------- Study Conclusions  - Left ventricle: The cavity size was normal. Wall thickness was increased in a pattern of mild LVH. Systolic function was severely reduced. The estimated ejection fraction was in the range of 20% to 25%. Diastolic function is abnormal, indeterminate grade. - Regional wall motion abnormality: Akinesis of the mid-apical anterior, basal-mid anteroseptal, mid anterolateral, apical septal, apical lateral, and apical myocardium. - Aortic valve: Valve area (VTI): 2.76 cm^2. Valve area (Vmax): 3.36 cm^2. - Systemic veins: IVC is small suggesting low RA pressures and hypovolemia.    Hospital Course   The patient is a 69 year old Caucasian male with past medical history of hyperlipidemia not on statin, prediabetes, hypertension, tobacco abuse and moderate carotid artery disease who presented to [facility] on 05/11/2015 as anterior STEMI. He denies any prior history of coronary disease. He was on Lipitor about point, however Lipitor stopped by his dermatologist due to eczema. Initial EKG showed ST elevation in V1 and V2 with reciprocal changes consistent with anterior STEMI. Patient was brought immediately to the cath lab for emergent cardiac catheterization. Cardiac cath on 10/6 showed three-vessel CAD with 90% prox RCA, 80% mid RCA, 80% OM3, 100% prox LAD occlusion treated with DESx2. EF 40% on cath. He also had elevated LVEDP. Dual antiplatelets therapy was recommended indefinitely. Echocardiogram was obtained on 10/8 showed EF 20-25%, akinesis of mid apical anterior, basal mid anteroseptal, mid anterolateral, apical septal, apical lateral and apical myocardium. Post cath, he is troponin went up to > 65. She was started on aspirin, carvedilol, lisinopril, Crestor and Brilinta. He has been tolerating Crestor without significant side effect. Within the  first 24 hours post cath, he did have a single run of  23 beats nonsustained VT, this has resolved without further recurrence. He had mildly elevated LFT with AST 300 and ALT 67. This was felt to be related to acute MI and eventually improved. Lipid panel was obtained on 10/7 showed cholesterol 252, triglyceride 165, LDL 186, HDL 33. He had elevated white blood cell count due to acute stress, this improved on repeat test.  He was seen in the morning of 05/14/2015, at which time he denies any significant chest discomfort or shortness of breath. He has been referred to cardiac rehabilitation and has been ambulating with cardiac rehabilitation nurse without difficulty. Further emphasis has been placed on compliance with dual antiplatelets therapy and statin therapy. He did have some hypotension the previous day, the ACE inhibitor was cut back with improvement of blood pressure. Of note, he does have significant residual disease, notably 90% proximal RCA, 80% mid RCA, and 80% OM 3. The decision was made to manage this medically for now and the plan for PCI if has recurrent symptoms. Given the significant LV dysfunction, he will be started on when necessary Lasix for lower extremity swelling and shortness breath. He has been instructed to contact cardiology if has any heart failure symptoms. I will arrange seven-day transition of care follow-up with cardiology service. He has been given 30 day Brilinta coupon to allow free 30 day supply of Brilinta. I have filled out medication assistance for him. I have discussed with MD, we will hold off on Lifevest for now, he will need close outpatient monitoring and repeat echo in 40 days to 3 month, if EF still low, will need to discuss ICD placement.    Discharge Vitals Blood pressure 127/59, pulse 65, temperature 98 F (36.7 C), temperature source Oral, resp. rate 18, height 5\' 8"  (1.727 m), weight 192 lb 14.4 oz (87.499 kg), SpO2 97 %.  Filed Weights   05/12/15 0600  05/13/15 0442 05/14/15 0421  Weight: 195 lb 1.7 oz (88.5 kg) 195 lb 8.8 oz (88.7 kg) 192 lb 14.4 oz (87.499 kg)    Labs  CBC  Recent Labs  05/12/15 0236 05/14/15 0320  WBC 18.6* 12.3*  HGB 13.6 12.4*  HCT 41.0 37.0*  MCV 94.7 94.9  PLT 274 500   Basic Metabolic Panel  Recent Labs  05/12/15 0236 05/14/15 0320  NA 140 138  K 4.2 4.4  CL 102 101  CO2 28 28  GLUCOSE 126* 109*  BUN 11 11  CREATININE 1.05 1.09  CALCIUM 8.9 8.9   Liver Function Tests  Recent Labs  05/12/15 0236 05/14/15 0320  AST 311* 77*  ALT 67* 35  ALKPHOS 43 41  BILITOT 1.3* 1.0  PROT 6.2* 6.1*  ALBUMIN 2.9* 2.9*   Cardiac Enzymes  Recent Labs  05/11/15 1058 05/11/15 1650  TROPONINI >65.00* >65.00*   Fasting Lipid Panel  Recent Labs  05/12/15 0236  CHOL 252*  HDL 33*  LDLCALC 186*  TRIG 165*  CHOLHDL 7.6    Disposition  Pt is being discharged home today in good condition.  Follow-up Plans & Appointments      Follow-up Information    Follow up with Leonie Man, MD.   Specialty:  Cardiology   Why:  Office scheduler will contact you to arrange followup, note we have 2 clinic in [facility], please verify which clinic your followup will be. Please give [place] a call if you do not hear from [place] in 2 business days   Contact information:   [place]  [address] [place] [address] [phone]       Discharge Medications    Medication List    STOP taking these medications        aspirin 81 MG EC tablet  Replaced by:  aspirin 81 MG chewable tablet     atorvastatin 40 MG tablet  Commonly known as:  LIPITOR      TAKE these medications        aspirin 81 MG chewable tablet  Chew 1 tablet (81 mg total) by mouth daily.     carvedilol 3.125 MG tablet  Commonly known as:  COREG  Take 3 tablets (9.375 mg total) by mouth 2 (two) times daily with a meal.     furosemide 20 MG tablet  Commonly known as:  LASIX  Take 1 tablet (20 mg total) by mouth as needed  for fluid or edema (for edema and shortness of breath).     lisinopril 5 MG tablet  Commonly known as:  PRINIVIL,ZESTRIL  Take 1 tablet (5 mg total) by mouth daily.     nitroGLYCERIN 0.4 MG SL tablet  Commonly known as:  NITROSTAT  Place 1 tablet (0.4 mg total) under the tongue every 5 (five) minutes x 3 doses as needed for chest pain.     pantoprazole 40 MG tablet  Commonly known as:  PROTONIX  Take 40 mg by mouth 2 (two) times daily.     rosuvastatin 40 MG tablet  Commonly known as:  CRESTOR  Take 1 tablet (40 mg total) by mouth daily at 6 PM.     ticagrelor 90 MG Tabs tablet  Commonly known as:  BRILINTA  Take 1 tablet (90 mg total) by mouth 2 (two) times daily.        Duration of Discharge Encounter   Greater than 30 minutes including physician time.  Hilbert Corrigan PA-C Pager: [phone] 05/14/2015, 8:53 AM

## 2015-05-14 NOTE — Discharge Instructions (Signed)
No driving for 48 hours. No lifting over 5 lbs for 1 week. No sexual activity for 1 week. Keep procedure site clean & dry. If you notice increased pain, swelling, bleeding or pus, call/return!  You may shower, but no soaking baths/hot tubs/pools for 1 week.   Take lasix 20mg  as needed once daily if has any sign of heart failure. To prevent heart failure: please followup instruction below  1. Avoid salt. 2. Limit amount of fluid intake to less than 2 liter per day. 3. Weigh yourself every morning and call cardiology if weight increase by more than 3 lbs overnight or 5 lbs in a single week.   If you develop any heart failure symptom, besides take daily as need lasix, you should also let your cardiologist know.    Acute Coronary Syndrome Acute coronary syndrome (ACS) is a serious problem in which there is suddenly not enough blood and oxygen supplied to the heart. ACS may mean that one or more of the blood vessels in your heart (coronary arteries) may be blocked. ACS can result in chest pain or a heart attack (myocardial infarction or MI). CAUSES This condition is caused by atherosclerosis, which is the buildup of fat and cholesterol (plaque) on the inside of the arteries. Over time, the plaque may narrow or block the artery, and this will lessen blood flow to the heart. Plaque can also become weak and break off within a coronary artery to form a clot and cause a sudden blockage. RISK FACTORS The risks factors of this condition include:  High cholesterol levels.  High blood pressure (hypertension).  Smoking.  Diabetes.  Age.  Family history of chest pain, heart disease, or stroke.  Lack of exercise. SYMPTOMS The most common signs of this condition include:  Chest pain, which can be:  A crushing or squeezing in the chest.  A tightness, pressure, fullness, or heaviness in the chest.  Present for more than a few minutes, or it can stop and recur.  Pain in the arms, neck, jaw, or  back.  Unexplained heartburn or indigestion.  Shortness of breath.  Nausea.  Sudden cold sweats.  Feeling light-headed or dizzy. Sometimes, this condition has no symptoms. DIAGNOSIS ACS may be diagnosed through the following tests:  Electrocardiogram (ECG).  Blood tests.  Coronary angiogram. This is a procedure to look at the coronary arteries to see if there is any blockage. TREATMENT Treatment for ACS may include:  Healthy behavioral changes to reduce or control risk factors.  Medicine.  Coronary stenting.A stent helps to keep an artery open.  Coronary angioplasty. This procedure widens a narrowed or blocked artery.  Coronary artery bypass surgery. This will allow your blood to pass the blockage (bypass) to reach your heart. HOME CARE INSTRUCTIONS Eating and Drinking  Follow a heart-healthy diet. A dietitian can you help to educate you about healthy food options and changes.  Use healthy cooking methods such as roasting, grilling, broiling, baking, poaching, steaming, or stir-frying. Talk to a dietitian to learn more about healthy cooking methods. Medicines  Take medicines only as directed by your health care provider.  Do not take the following medicines unless your health care provider approves:  Nonsteroidal anti-inflammatory drugs (NSAIDs), such as ibuprofen, naproxen, or celecoxib.  Vitamin supplements that contain vitamin A, vitamin E, or both.  Hormone replacement therapy that contains estrogen with or without progestin.  Stop illegal drug use. Activities  Follow an exercise program that is approved by your health care provider.  Plan rest periods when you are fatigued. Lifestyle  Do not use any tobacco products, including cigarettes, chewing tobacco, or electronic cigarettes. If you need help quitting, ask your health care provider.  If you drink alcohol, and your health care provider approves, limit your alcohol intake to no more than 1 drink  per day. One drink equals 12 ounces of beer, 5 ounces of wine, or 1 ounces of hard liquor.  Learn to manage stress.  Maintain a healthy weight. Lose weight as approved by your health care provider. General Instructions  Manage other health conditions, such as hypertension and diabetes, as directed by your health care provider.  Keep all follow-up visits as directed by your health care provider. This is important.  Your health care provider may ask you to monitor your blood pressure. A blood pressure reading consists of a higher number over a lower number, such as 110 over 72, written as 110/72. Ideally, your blood pressure should be:  Below 140/90 if you have no other medical conditions.  Below 130/80 if you have diabetes or kidney disease. SEEK IMMEDIATE MEDICAL CARE IF:  You have pain in your chest, neck, arm, jaw, stomach, or back that lasts more than a few minutes, is recurring, or is not relieved by taking medicine under your tongue (sublingual nitroglycerin).  You have profuse sweating without cause.  You have unexplained:  Heartburn or indigestion.  Shortness of breath or difficulty breathing.  Nausea or vomiting.  Fatigue.  Feelings of nervousness or anxiety.  Weakness.  Diarrhea.  You have sudden light-headedness or dizziness.  You faint. These symptoms may represent a serious problem that is an emergency. Do not wait to see if the symptoms will go away. Get medical help right away. Call your local emergency services (911 in the [place] Do not drive yourself to the clinic or hospital.   This information is not intended to replace advice given to you by your health care provider. Make sure you discuss any questions you have with your health care provider.   Document Released: 07/22/2005 Document Revised: 08/12/2014 Document Reviewed: 11/23/2013 Elsevier Interactive Patient Education [employer].

## 2015-05-14 NOTE — Progress Notes (Addendum)
Reported H&H result to MD Steward. Also notified MD Steward that patient has had bloody stools x 3 now with clots noted and that patient has become a little more confused (patient does not know the date) Orders received to order another H&H at this time

## 2015-05-14 NOTE — H&P (Signed)
Admission H&P    Chief Complaint: code stroke, confusion and visual impairment HPI: Damon Walker is an 69 y.o. male with a past medical history significant for HTN, hyperlipidemia, prediabetes ,carotid artery disease, smoking, CAD s/p cath and stenting via right radial artery on 05/11/15,  STEMI, ischemic cardiomyopathy, presents to the ED with his wife for evaluation of acute onset confusion and visual impairment. Never had similar symptoms before. Patient was discharged from the hospital today, went home\, finished having lunch with his wife when she noted that he was all of the sudden confused and few minutes later he began complaining of decreased vision in the right visual field. Denies associated HA, vertigo, double vision, focal weakness or numbness,slurred speech, language impairment of unsteadiness. Patient on aspirin and plavix. NIHSS 4 ,CT brain was personally reviewed and showed no acute abnormality.  LSN: 05/14/15 at 12 pm NIHSS: 4 tPA Given: Yes   Past Medical History  Diagnosis Date  . Untreated Hypertension   . Tobacco abuse     a. 30 yrs - 1.5 ppd.  Marland Kitchen HLD (hyperlipidemia)   . Carotid arterial disease ([place])   . CAD (coronary artery disease)     Cath 05/11/2015 three-vessel CAD with 90% prox RCA, 80% mid RCA, 80% OM3, 100% prox LAD occlusion treated with DESx2. RCA and OM residual treated medically for now, however will need PCI if has recurrent CP  . Prediabetes     A1C 6.0 in Oct 2016  . Ischemic cardiomyopathy     EF 40% on cath 05/11/2015 after anterior STEMI, EF 20-25% on echo 05/13/2015    Past Surgical History  Procedure Laterality Date  . S/p appendectomy      Age 48  . S/p inguinal hernia repair      In his 20's.  . Cardiac catheterization N/A 05/11/2015    Procedure: Left Heart Cath and Coronary Angiography;  Surgeon: Peter M Martinique, MD;  Location: [place] [facility];  Service: Cardiovascular;  Laterality: N/A;  . Cardiac catheterization N/A 05/11/2015     Procedure: Coronary Stent Intervention;  Surgeon: Peter M Martinique, MD;  Location: [place] [facility];  Service: Cardiovascular;  Laterality: N/A;    Family History  Problem Relation Age of Onset  . Adopted: Yes  . Other      Adopted - unaware of biological parent's histories.  . Other Mother     eczema   . Cancer Mother    Social History:  reports that he has been smoking Cigarettes.  He has a 30 pack-year smoking history. He does not have any smokeless tobacco history on file. He reports that he does not drink alcohol or use illicit drugs. Family history: no MS, brain tumor, epilepsy. Allergies:  Allergies  Allergen Reactions  . Latex Itching and Rash     (Not in a hospital admission)  ROS:   Physical Examination: Blood pressure 126/64, pulse 72, temperature 98.3 F (36.8 C), temperature source Oral, resp. rate 17, weight 87.488 kg (192 lb 14 oz), SpO2 97 %.  HEENT-  Normocephalic, no lesions, without obvious abnormality.  Normal external eye and conjunctiva.  Normal TM's bilaterally.  Normal auditory canals and external ears. Normal external nose, mucus membranes and septum.  Normal pharynx. Neck supple with no masses, nodes, nodules or enlargement. Cardiovascular - regular rate and rhythm, S1, S2 normal, no murmur, click, rub or gallop Lungs - chest clear, no wheezing, rales, normal symmetric air entry, Heart exam - S1, S2 normal, no murmur,  no gallop, rate regular Abdomen - soft, non-tender; bowel sounds normal; no masses,  no organomegaly Extremities - no joint deformities, effusion, or inflammation  Neurologic Examination: General: NAD Mental Status: Alert, oriented, thought content appropriate.  Speech fluent without evidence of aphasia.  Able to follow 3 step commands without difficulty. Cranial Nerves: II: Discs flat bilaterally; Visual fields densely impaired right visual field, pupils equal, round, reactive to light and accommodation III,IV, VI: ptosis not  present, gaze partially restricted to the right V,VII: smile symmetric, facial light touch sensation normal bilaterally VIII: hearing normal bilaterally IX,X: uvula rises symmetrically XI: bilateral shoulder shrug XII: midline tongue extension without atrophy or fasciculations Motor: Right : Upper extremity   5/5    Left:     Upper extremity   5/5  Lower extremity   5/5     Lower extremity   5/5 Tone and bulk:normal tone throughout; no atrophy noted Sensory: Pinprick diminished in the right side. Deep Tendon Reflexes:  Right: Upper Extremity   Left: Upper extremity   biceps (C-5 to C-6) 2/4   biceps (C-5 to C-6) 2/4 tricep (C7) 2/4    triceps (C7) 2/4 Brachioradialis (C6) 2/4  Brachioradialis (C6) 2/4  Lower Extremity Lower Extremity  quadriceps (L-2 to L-4) 2/4   quadriceps (L-2 to L-4) 2/4 Achilles (S1) 2/4   Achilles (S1) 2/4  Plantars: Right: downgoing   Left: downgoing Cerebellar: normal finger-to-nose,  normal heel-to-shin test Gait:  No tested due to multiple leads    Results for orders placed or performed during the hospital encounter of 05/14/15 (from the past 48 hour(s))  Protime-INR     Status: None   Collection Time: 05/14/15  2:17 PM  Result Value Ref Range   Prothrombin Time 14.5 11.6 - 15.2 seconds   INR 1.11 0.00 - 1.49  APTT     Status: None   Collection Time: 05/14/15  2:17 PM  Result Value Ref Range   aPTT 27 24 - 37 seconds  CBC     Status: Abnormal   Collection Time: 05/14/15  2:17 PM  Result Value Ref Range   WBC 12.0 (H) 4.0 - 10.5 K/uL   RBC 4.09 (L) 4.22 - 5.81 MIL/uL   Hemoglobin 12.7 (L) 13.0 - 17.0 g/dL   HCT 38.3 (L) 39.0 - 52.0 %   MCV 93.6 78.0 - 100.0 fL   MCH 31.1 26.0 - 34.0 pg   MCHC 33.2 30.0 - 36.0 g/dL   RDW 13.1 11.5 - 15.5 %   Platelets 262 150 - 400 K/uL  Differential     Status: Abnormal   Collection Time: 05/14/15  2:17 PM  Result Value Ref Range   Neutrophils Relative % 68 %   Neutro Abs 8.3 (H) 1.7 - 7.7 K/uL    Lymphocytes Relative 18 %   Lymphs Abs 2.2 0.7 - 4.0 K/uL   Monocytes Relative 10 %   Monocytes Absolute 1.2 (H) 0.1 - 1.0 K/uL   Eosinophils Relative 3 %   Eosinophils Absolute 0.3 0.0 - 0.7 K/uL   Basophils Relative 1 %   Basophils Absolute 0.1 0.0 - 0.1 K/uL  I-stat troponin, ED (not at [facility])     Status: Abnormal   Collection Time: 05/14/15  2:22 PM  Result Value Ref Range   Troponin i, poc 28.16 (HH) 0.00 - 0.08 ng/mL   Comment NOTIFIED PHYSICIAN    Comment 3            Comment: Due  to the release kinetics of cTnI, a negative result within the first hours of the onset of symptoms does not rule out myocardial infarction with certainty. If myocardial infarction is still suspected, repeat the test at appropriate intervals.   I-Stat Chem 8, ED  (not at [facility], [facility])     Status: Abnormal   Collection Time: 05/14/15  2:24 PM  Result Value Ref Range   Sodium 137 135 - 145 mmol/L   Potassium 4.0 3.5 - 5.1 mmol/L   Chloride 102 101 - 111 mmol/L   BUN 16 6 - 20 mg/dL   Creatinine, Ser 1.10 0.61 - 1.24 mg/dL   Glucose, Bld 119 (H) 65 - 99 mg/dL   Calcium, Ion 1.13 1.13 - 1.30 mmol/L   TCO2 23 0 - 100 mmol/L   Hemoglobin 13.6 13.0 - 17.0 g/dL   HCT 40.0 39.0 - 52.0 %   Ct Head Wo Contrast  05/14/2015   CLINICAL DATA:  Code stroke. Heart catheterization with subsequent onset of headache and bilateral blurred vision  EXAM: CT HEAD WITHOUT CONTRAST  TECHNIQUE: Contiguous axial images were obtained from the base of the skull through the vertex without intravenous contrast.  COMPARISON:  06/22/2014  FINDINGS: No sign of acute infarction by CT. 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 This is not acute. No hydrocephalus. No hemorrhage. No mass lesion. No calvarial abnormality of an acute nature. Sinuses  are clear. There is fluid in the middle ears bilaterally.  IMPRESSION: No acute finding by CT. 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  These results were called by telephone at the time of interpretation on 05/14/2015 at 2:36 pm to Dr. Orlie Dakin, who verbally acknowledged these results.   Electronically Signed   By: Nelson Chimes M.D.   On: 05/14/2015 14:39    Assessment: 69 y.o. male with multiple risk factors for stroke who was discharged from the hospital today after having STMI and undergoing cath and stenting via right radial artery on 05/11/15, presents to the ED with acute onset confusion and visual impairment. NIHSS 4, with dense right visual field cut. CT brain without acute intracranial abnormality. Suspect left PCA distribution infarct. NIHSS 4 but potentially disabling visual deficit and thus decided to treat with iv tpa. Admit to NICU. Complete stroke work up. Stroke team will follow up tomorrow.  Stroke Risk Factors - age, HTN, hyperlipidemia, prediabetes ,carotid artery disease, CAD, ischemic cardiomyopathy, smoking  Plan: 1. HgbA1c, fasting lipid panel 2. MRI, MRA  of the brain without contrast 3. PT consult, OT consult, Speech consult 4. Echocardiogram 5. Carotid dopplers 6. Prophylactic therapy-aspirin as per post iv tpa protocol 7. Risk factor modification 7. Telemetry monitoring   Dorian Pod, MD [facility] [phone]  05/14/2015, 2:55 PM

## 2015-05-14 NOTE — Progress Notes (Signed)
Code stroke called at 1409, patient arrived to [facility] ED via private vehicle at 1353.  AS per family, Patient was discharged earlier this morning from [facility] hospital and was at home eating lunch when wife noticed patient experiencing confusion and blurred vision.  NIHSS 4.  Tpa ordered at 1443 and delivered to bedside at 1451, and TPA started at 1454. Patient received 8 mg bolus and then 71 mg for a total of 79 mg over 1 hour.  Order placed for ICU bed.

## 2015-05-14 NOTE — ED Notes (Signed)
Patient was d/c from the hospital today.  He was fine.  He went to eat and had sudden onset of blurred vision in both eyes and he was confused.  Patient had cardiac caths and stents during hospitalization.  No trauma.

## 2015-05-14 NOTE — Progress Notes (Signed)
Patient has 250 cc of bright red blood while having BM. MD Steward notified. Orders received to obtain H&H

## 2015-05-14 NOTE — ED Notes (Signed)
Attempted report x1. 

## 2015-05-14 NOTE — Progress Notes (Signed)
Patient Name: Damon Walker Date of Encounter: 05/14/2015  Primary Cardiologist: Dr. Ellyn Hack   Principal Problem:   ST elevation (STEMI) myocardial infarction involving left anterior descending coronary artery [facility]) Active Problems:   HTN (hypertension)   HLD (hyperlipidemia)   Tobacco abuse   Carotid arterial disease ([place])    SUBJECTIVE  Denies any CP or SOB.   CURRENT MEDS . aspirin  81 mg Oral Daily  . carvedilol  9.375 mg Oral BID WC  . lisinopril  5 mg Oral Daily  . pantoprazole  40 mg Oral BID  . polyethylene glycol  17 g Oral Daily  . rosuvastatin  40 mg Oral q1800  . sodium chloride  3 mL Intravenous Q12H  . ticagrelor  90 mg Oral BID    OBJECTIVE  Filed Vitals:   05/13/15 0828 05/13/15 1442 05/13/15 2016 05/14/15 0421  BP: 112/60 102/55 107/58 127/59  Pulse: 73 74 66 65  Temp:  98.4 F (36.9 C) 98.5 F (36.9 C) 98 F (36.7 C)  TempSrc:  Oral Oral Oral  Resp:  18 18 18   Height:      Weight:    192 lb 14.4 oz (87.499 kg)  SpO2:  96% 97% 97%    Intake/Output Summary (Last 24 hours) at 05/14/15 0745 Last data filed at 05/13/15 1825  Gross per 24 hour  Intake    723 ml  Output    200 ml  Net    523 ml   Filed Weights   05/12/15 0600 05/13/15 0442 05/14/15 0421  Weight: 195 lb 1.7 oz (88.5 kg) 195 lb 8.8 oz (88.7 kg) 192 lb 14.4 oz (87.499 kg)    PHYSICAL EXAM  General: Pleasant, NAD. Neuro: Alert and oriented X 3. Moves all extremities spontaneously. Psych: Normal affect. HEENT:  Normal  Neck: Supple without bruits or JVD. Lungs:  Resp regular and unlabored, CTA. Heart: RRR no s3, s4, or murmurs. R radial cath site stable with 2+ distal radial pulse Abdomen: Soft, non-tender, non-distended, BS + x 4.  Extremities: No clubbing, cyanosis or edema. DP/PT/Radials 2+ and equal bilaterally.  Accessory Clinical Findings  CBC  Recent Labs  05/12/15 0236 05/14/15 0320  WBC 18.6* 12.3*  HGB 13.6 12.4*  HCT 41.0 37.0*  MCV 94.7 94.9    PLT 274 751   Basic Metabolic Panel  Recent Labs  05/12/15 0236 05/14/15 0320  NA 140 138  K 4.2 4.4  CL 102 101  CO2 28 28  GLUCOSE 126* 109*  BUN 11 11  CREATININE 1.05 1.09  CALCIUM 8.9 8.9   Liver Function Tests  Recent Labs  05/12/15 0236 05/14/15 0320  AST 311* 77*  ALT 67* 35  ALKPHOS 43 41  BILITOT 1.3* 1.0  PROT 6.2* 6.1*  ALBUMIN 2.9* 2.9*   Cardiac Enzymes  Recent Labs  05/11/15 1058 05/11/15 1650  TROPONINI >65.00* >65.00*   Fasting Lipid Panel  Recent Labs  05/12/15 0236  CHOL 252*  HDL 33*  LDLCALC 186*  TRIG 165*  CHOLHDL 7.6    TELE NSR with HR 60-80s    ECG  10/8 NSR with deep TWI in anterolateral leads, poor R wave progression in anterior leads  Echocardiogram 05/13/2015  LV EF: 20% -  25%  ------------------------------------------------------------------- Indications:   CAD of native vessels 414.01.  ------------------------------------------------------------------- History:  Risk factors: Current tobacco use. Hypertension. Dyslipidemia.  ------------------------------------------------------------------- Study Conclusions  - Left ventricle: The cavity size was normal. Wall thickness was increased in a  pattern of mild LVH. Systolic function was severely reduced. The estimated ejection fraction was in the range of 20% to 25%. Diastolic function is abnormal, indeterminate grade. - Regional wall motion abnormality: Akinesis of the mid-apical anterior, basal-mid anteroseptal, mid anterolateral, apical septal, apical lateral, and apical myocardium. - Aortic valve: Valve area (VTI): 2.76 cm^2. Valve area (Vmax): 3.36 cm^2. - Systemic veins: IVC is small suggesting low RA pressures and hypovolemia.    Radiology/Studies  Dg Chest 1 View  05/11/2015   CLINICAL DATA:  Myocardial infarction.  History of smoking.  EXAM: CHEST 1 VIEW  COMPARISON:  Chest radiograph 12/14/2014  FINDINGS: Increased  interstitial densities throughout both lungs. Findings are suggestive for interstitial pulmonary edema. The heart size is normal. Trachea is midline. Negative for a pneumothorax. No large pleural effusions.  IMPRESSION: Increased interstitial densities in both lungs. Findings are suggestive for interstitial pulmonary edema.   Electronically Signed   By: Markus Daft M.D.   On: 05/11/2015 09:36    ASSESSMENT AND PLAN  69 yo male w/ PMH of HTN, HLD, Carotid artery disease, and tobacco abuse who presented with an anterior STEMI on 05/11/2015. Stenting of the proximal to mid LAD with DES x 2. Noted to have severe disease in the proximal RCA, mid RCA, and small branches of the LCx  1. Anterior STEMI  - cath 05/11/2015 3v CAD with 90% prox RCA, 80% mid RCA, 80% OM3, 100% prox LAD occlusion treated with DESx2. EF 40% on cath. Elevated LVEDP. Recommended DAPT indefinitely.   - continue ASA, coreg, lisinopril, crestor and brilinta.   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 Discussed importance of DAPT.   2. Ischemic cardiomyopathy  - EF 40% on cath 05/11/2015  - Echo 05/13/2015 EF 20-25%, akinesis of mid-apical anterior, basal-mid anteroseptal, mid anterolateral, apical septal, apical lateral and apical myocardium.   - MD to decide whether a Lifevest candidate, will need repeat echo in 3 month to see if any improvement, if EF remain <35%, will need ICD  - currently compensated without sign of HF, however given low EF, consider PRN 20mg  lasix on discharge  3. Multivessel CAD seen on cath during this admission, newly diagnosed  - Also has severe stenoses in RCA and OM2, no plan for PCI at this time, unless symptoms develop  - may benefit from staged PCI at some point given 90% prox RCA, 80% mid RCA and 80% OM3, plan to medically treat for now and PCI if develop recurrent symptom.  4. Uncontrolled  hyperlipidemia  - LDL 186, trig 165, chol 252.  - Previously on lipitor, stopped due to eczema (True relationship?). Statin restarted as crestor.   5. Pre-diabetes  6. HTN: Hypotensive yesterday, ACEI cut back, BP improved.  7. Tobacco abuse  8. Bilateral moderate carotid arterial dx  9. NSVT: 23 beats run of NSVT within 24 hrs of acute MI, no recurrence  Signed, Woodward Ku Pager: [phone]  Patient seen and discussed with PA Eulas Post, I agree with his documentation. Admit with anterior STEMI, s/p DES x2 to LAD. He has residual RCA and LCX disease that will be treated medically at this time, pending recurrent symptoms and progression of his LVEF can consider additional procedures. Echo LVEF 20-25% post MI. He is on ASA, coreg, lisinopril, crestor, brilinta. Hemodynamically tolerating this regimen well. He had some NSVT shortly after his MI, but with beta  blocker therapy has not had any significant recurrence. Elevated LFTs are trending down and near normal. Will plan for discharge today, will need f/u with 2 weeks in cardiology clinic. Repeat echo earliest at 40 days.   Zandra Abts MD

## 2015-05-14 NOTE — ED Notes (Signed)
[facility] contacted to page Code Stroke 

## 2015-05-15 ENCOUNTER — Inpatient Hospital Stay (HOSPITAL_COMMUNITY): Payer: Medicare Other

## 2015-05-15 ENCOUNTER — Encounter (HOSPITAL_COMMUNITY): Payer: Self-pay | Admitting: Radiology

## 2015-05-15 ENCOUNTER — Ambulatory Visit (HOSPITAL_COMMUNITY): Payer: Medicare Other

## 2015-05-15 ENCOUNTER — Telehealth: Payer: Self-pay | Admitting: Cardiology

## 2015-05-15 DIAGNOSIS — Z8719 Personal history of other diseases of the digestive system: Secondary | ICD-10-CM

## 2015-05-15 DIAGNOSIS — I6789 Other cerebrovascular disease: Secondary | ICD-10-CM

## 2015-05-15 DIAGNOSIS — I151 Hypertension secondary to other renal disorders: Secondary | ICD-10-CM

## 2015-05-15 DIAGNOSIS — I5021 Acute systolic (congestive) heart failure: Secondary | ICD-10-CM

## 2015-05-15 DIAGNOSIS — Z9861 Coronary angioplasty status: Secondary | ICD-10-CM

## 2015-05-15 DIAGNOSIS — N2889 Other specified disorders of kidney and ureter: Secondary | ICD-10-CM

## 2015-05-15 DIAGNOSIS — I639 Cerebral infarction, unspecified: Secondary | ICD-10-CM

## 2015-05-15 DIAGNOSIS — I2510 Atherosclerotic heart disease of native coronary artery without angina pectoris: Secondary | ICD-10-CM

## 2015-05-15 DIAGNOSIS — I2109 ST elevation (STEMI) myocardial infarction involving other coronary artery of anterior wall: Secondary | ICD-10-CM

## 2015-05-15 DIAGNOSIS — I63432 Cerebral infarction due to embolism of left posterior cerebral artery: Secondary | ICD-10-CM

## 2015-05-15 DIAGNOSIS — K922 Gastrointestinal hemorrhage, unspecified: Secondary | ICD-10-CM

## 2015-05-15 DIAGNOSIS — I255 Ischemic cardiomyopathy: Secondary | ICD-10-CM

## 2015-05-15 HISTORY — DX: Personal history of other diseases of the digestive system: Z87.19

## 2015-05-15 HISTORY — PX: TRANSTHORACIC ECHOCARDIOGRAM: SHX275

## 2015-05-15 LAB — CBC
HCT: 29.8 % — ABNORMAL LOW (ref 39.0–52.0)
HEMOGLOBIN: 9.8 g/dL — AB (ref 13.0–17.0)
MCH: 30.8 pg (ref 26.0–34.0)
MCHC: 32.9 g/dL (ref 30.0–36.0)
MCV: 93.7 fL (ref 78.0–100.0)
PLATELETS: 236 10*3/uL (ref 150–400)
RBC: 3.18 MIL/uL — AB (ref 4.22–5.81)
RDW: 13.2 % (ref 11.5–15.5)
WBC: 10.2 10*3/uL (ref 4.0–10.5)

## 2015-05-15 LAB — BASIC METABOLIC PANEL
ANION GAP: 7 (ref 5–15)
BUN: 15 mg/dL (ref 6–20)
CALCIUM: 8.2 mg/dL — AB (ref 8.9–10.3)
CO2: 23 mmol/L (ref 22–32)
CREATININE: 0.96 mg/dL (ref 0.61–1.24)
Chloride: 109 mmol/L (ref 101–111)
Glucose, Bld: 98 mg/dL (ref 65–99)
Potassium: 3.9 mmol/L (ref 3.5–5.1)
SODIUM: 139 mmol/L (ref 135–145)

## 2015-05-15 LAB — GLUCOSE, CAPILLARY
GLUCOSE-CAPILLARY: 100 mg/dL — AB (ref 65–99)
GLUCOSE-CAPILLARY: 94 mg/dL (ref 65–99)
GLUCOSE-CAPILLARY: 154 mg/dL — AB (ref 65–99)
GLUCOSE-CAPILLARY: 78 mg/dL (ref 65–99)
Glucose-Capillary: 128 mg/dL — ABNORMAL HIGH (ref 65–99)

## 2015-05-15 LAB — LIPID PANEL
CHOL/HDL RATIO: 7.1 ratio
CHOLESTEROL: 163 mg/dL (ref 0–200)
HDL: 23 mg/dL — ABNORMAL LOW (ref >40)
LDL Cholesterol: 111 mg/dL — ABNORMAL HIGH (ref 0–99)
TRIGLYCERIDES: 144 mg/dL (ref <150)
VLDL: 29 mg/dL (ref 0–40)

## 2015-05-15 LAB — TYPE AND SCREEN
ABO/RH(D): A POS
Antibody Screen: NEGATIVE

## 2015-05-15 LAB — HEMOGLOBIN AND HEMATOCRIT, BLOOD
HCT: 32 % — ABNORMAL LOW (ref 39.0–52.0)
Hemoglobin: 10.8 g/dL — ABNORMAL LOW (ref 13.0–17.0)

## 2015-05-15 LAB — ABO/RH: ABO/RH(D): A POS

## 2015-05-15 LAB — HEMOGLOBIN: Hemoglobin: 9.5 g/dL — ABNORMAL LOW (ref 13.0–17.0)

## 2015-05-15 MED ORDER — IOHEXOL 350 MG/ML SOLN
50.0000 mL | Freq: Once | INTRAVENOUS | Status: AC | PRN
  Administered 2015-05-15: 50 mL via INTRAVENOUS

## 2015-05-15 MED ORDER — PANTOPRAZOLE SODIUM 40 MG PO TBEC
40.0000 mg | DELAYED_RELEASE_TABLET | Freq: Every day | ORAL | Status: DC
  Administered 2015-05-15 – 2015-05-18 (×4): 40 mg via ORAL
  Filled 2015-05-15 (×4): qty 1

## 2015-05-15 MED ORDER — CLOPIDOGREL BISULFATE 75 MG PO TABS
75.0000 mg | ORAL_TABLET | Freq: Every day | ORAL | Status: DC
  Administered 2015-05-16 – 2015-05-18 (×3): 75 mg via ORAL
  Filled 2015-05-15 (×3): qty 1

## 2015-05-15 MED ORDER — SODIUM CHLORIDE 0.9 % IV SOLN: Freq: Once | INTRAVENOUS | Status: AC

## 2015-05-15 MED ORDER — ROSUVASTATIN CALCIUM 40 MG PO TABS
40.0000 mg | ORAL_TABLET | Freq: Every day | ORAL | Status: DC
  Administered 2015-05-15 – 2015-05-17 (×2): 40 mg via ORAL
  Filled 2015-05-15 (×4): qty 1

## 2015-05-15 MED ORDER — CLOPIDOGREL BISULFATE 75 MG PO TABS
300.0000 mg | ORAL_TABLET | Freq: Once | ORAL | Status: AC
  Administered 2015-05-15: 300 mg via ORAL
  Filled 2015-05-15: qty 4

## 2015-05-15 NOTE — Telephone Encounter (Signed)
Patient seen in [facility] ICU  [name], Damon Green, MD

## 2015-05-15 NOTE — Progress Notes (Signed)
SLP Cancellation Note  Patient Details Name: Damon Walker MRN: [id number] DOB: January 24, 1946   Cancelled treatment:       Reason Eval/Treat Not Completed: Patient at procedure or test/unavailable   Ivadell Gaul, Katherene Ponto 05/15/2015, 2:38 PM

## 2015-05-15 NOTE — Progress Notes (Signed)
   05/15/15 1637  Clinical Encounter Type  Visited With Patient and family together;Health care provider  Visit Type Initial  Referral From [place] responded to a request for an advanced directive. Patient and family seems unsure about completing the documents, and chaplain left the forms with them and offered to assist in form completion. Chaplain support available as needed.   Jeri Lager, Chaplain 05/15/2015 4:39 PM

## 2015-05-15 NOTE — Progress Notes (Signed)
OT Cancellation Note  Patient Details Name: Damon Walker MRN: [id number] DOB: Apr 03, 1946   Cancelled Treatment:    Reason Eval/Treat Not Completed: Patient not medically ready Pt on strict bedrest. Please update activity orders when appropriate for therapy. Thanks. Booker, OTR/L  [phone] 05/15/2015 05/15/2015, 7:33 AM

## 2015-05-15 NOTE — Progress Notes (Signed)
Dr. Nicole Kindred notified of BP and Hgb trending down and continued bloody stools. Order received for 2 units FFP. Will administer and continue to monitor.

## 2015-05-15 NOTE — Telephone Encounter (Signed)
Pt needs a post hosp f/u call He is scheduled to f/u with Dr.[name] on 12/28 at 4:00pm  Thanks

## 2015-05-15 NOTE — Consult Note (Signed)
Referring Provider:   Dr. Lavera Guise Primary Care Physician:  [name], MD Primary Gastroenterologist:  Dr. Watt Climes  Reason for Consultation:  Hematochezia  HPI: Damon Walker is a 69 y.o. 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 Since that time, he has had a total of 5 bloody bowel movements, most recently several hours ago.    He had just been discharged from the hospital earlier in the day yesterday, following a severe ST segment elevation MI on 05/11/2015, during which time he underwent placement of 2 drug-eluting stents into the LAD, but he was noted to have severe three-vessel disease. His most recent ejection fraction is on the order of 20 or 25%. He was discharged on Brilinta, which has subsequently been discontinued.  Since admission, his hemoglobin has dropped approximately 2 g (he did receive 2 units of FFP, so there could be a dilutional component).  The patient is not completely oriented (see exam below), but for what is worth, he denies current abdominal pain or other acute symptoms such as chest pain or shortness of breath.   Past Medical History  Diagnosis Date  . Untreated Hypertension   . Tobacco abuse     a. 30 yrs - 1.5 ppd.  Marland Kitchen HLD (hyperlipidemia)   . Carotid arterial disease ([place])   . CAD (coronary artery disease)     Cath 05/11/2015 three-vessel CAD with 90% prox RCA, 80% mid RCA, 80% OM3, 100% prox LAD occlusion treated with DESx2. RCA and OM residual treated medically for now, however will need PCI if has recurrent CP  . Prediabetes     A1C 6.0 in Oct 2016  . Ischemic cardiomyopathy     EF 40% on cath 05/11/2015 after anterior STEMI, EF 20-25% on echo 05/13/2015    Past Surgical History  Procedure Laterality Date  . S/p  appendectomy      Age 2  . S/p inguinal hernia repair      In his 20's.  . Cardiac catheterization N/A 05/11/2015    Procedure: Left Heart Cath and Coronary Angiography;  Surgeon: Peter M Martinique, MD;  Location: [facility];  Service: Cardiovascular;  Laterality: N/A;  . Cardiac catheterization N/A 05/11/2015    Procedure: Coronary Stent Intervention;  Surgeon: Peter M Martinique, MD;  Location: [facility];  Service: Cardiovascular;  Laterality: N/A;    Prior to Admission medications   Medication Sig Start Date End Date Taking? Authorizing Provider  aspirin 81 MG chewable tablet Chew 1 tablet (81 mg total) by mouth daily. 05/14/15   Almyra Deforest, PA  carvedilol (COREG) 3.125 MG tablet Take 3 tablets (9.375 mg total) by mouth 2 (two) times daily with a meal. 05/14/15   Almyra Deforest, PA  furosemide (LASIX) 20 MG tablet Take 1 tablet (20 mg total) by mouth as needed for fluid or edema (for edema and shortness of breath). 05/14/15   Almyra Deforest, PA  lisinopril (PRINIVIL,ZESTRIL) 5 MG tablet Take 1 tablet (5 mg total) by mouth daily. 05/14/15   Almyra Deforest, PA  nitroGLYCERIN (NITROSTAT) 0.4 MG SL tablet Place 1 tablet (0.4 mg total) under the tongue every 5 (five) minutes x 3 doses as needed for chest pain. 05/14/15   Almyra Deforest, PA  pantoprazole (PROTONIX) 40 MG tablet Take  40 mg by mouth 2 (two) times daily.    Historical Provider, MD  rosuvastatin (CRESTOR) 40 MG tablet Take 1 tablet (40 mg total) by mouth daily at 6 PM. 05/14/15   Almyra Deforest, PA  ticagrelor (BRILINTA) 90 MG TABS tablet Take 1 tablet (90 mg total) by mouth 2 (two) times daily. 05/14/15   Almyra Deforest, PA    Current Facility-Administered Medications  Medication Dose Route Frequency Provider Last Rate Last Dose  . 0.9 %  sodium chloride infusion   Intravenous Continuous [name], MD 75 mL/hr at 05/15/15 1000    . acetaminophen (TYLENOL) tablet 650 mg  650 mg Oral Q4H PRN [name], MD       Or  . acetaminophen (TYLENOL) suppository  650 mg  650 mg Rectal Q4H PRN [name], MD      . labetalol (NORMODYNE,TRANDATE) injection 10 mg  10 mg Intravenous Q10 min PRN [name], MD      . pantoprazole (PROTONIX) injection 40 mg  40 mg Intravenous QHS [name], MD   40 mg at 05/14/15 2223  . rosuvastatin (CRESTOR) tablet 40 mg  40 mg Oral q1800 [name], MD      . senna-docusate (Senokot-S) tablet 1 tablet  1 tablet Oral QHS PRN [name], MD        Allergies as of 05/14/2015 - Review Complete 05/14/2015  Allergen Reaction Noted  . Latex Itching and Rash 06/22/2014    Family History  Problem Relation Age of Onset  . Adopted: Yes  . Other      Adopted - unaware of biological parent's histories.  . Other Mother     eczema   . Cancer Mother     Social History   Social History  . Marital Status: Married    Spouse Name: N/A  . Number of Children: 3  . Years of Education: N/A   Occupational History  . retired from [employer]  . Smoking status: Current Every Day Smoker -- 1.00 packs/day for 30 years    Types: Cigarettes  . Smokeless tobacco: Not on file  . Alcohol Use: No  . Drug Use: No  . Sexual Activity: Not on file   Other Topics Concern  . Not on file   Social History Narrative   Lives in [place] with wife. Originally from [place].    Review of Systems: [name] is at the bedside and indicates that he has had diminished exercise tolerance recently. He has not really have classic prodromal chest pain, although periodically would get pain up in his neck, for which she had been seen at our office for possible reflux, although it did not respond to PPI therapy. He underwent upper endoscopy on the same day as his colonoscopy, September 29, which showed some minor esophagitis and a nodule in the distal esophagus.  No complaint of prodromal orthopnea or significant dyspnea at home.  He does get frequent diarrhea, according to his  wife.  There is a history of eczema.  He is edentulous and uses dentures, denies arthralgias.  Physical Exam: Vital signs in last 24 hours: Temp:  [97.5 F (36.4 C)-98.7 F (37.1 C)] 98.6 F (37 C) (10/10 0747) Pulse Rate:  [59-87] 71 (10/10 0711) Resp:  [12-28] 21 (10/10 1000) BP: (90-153)/(35-74) 95/47 mmHg (10/10 1000) SpO2:  [95 %-100 %] 100 % (10/10 0800) Weight:  [87.488 kg (192 lb 14  oz)-89 kg (196 lb 3.4 oz)] 89 kg (196 lb 3.4 oz) (10/09 1545) Last BM Date: 05/14/15 General:   Alert,  Well-developed, well-nourished, pleasant and cooperative in NAD Head:  Normocephalic and atraumatic. Eyes:  Sclera clear, no icterus.   Conjunctiva pink. Mouth:   No ulcerations or lesions.  Oropharynx pink & moist. Edentulous. Neck:   No masses or thyromegaly. Lungs:  Clear throughout to auscultation.   No wheezes, crackles, or rhonchi. No evident respiratory distress. Heart:   Regular rate and rhythm; no murmurs, clicks, rubs,  or gallops. Abdomen:  Soft, nontender, nontympanitic, and nondistended. No masses, hepatosplenomegaly or ventral hernias noted. Normal bowel sounds, without bruits, guarding, or rebound.   Msk:   Symmetrical without gross deformities. Pulses:  Strong right radial pulse Extremities:   Without clubbing, cyanosis, or edema. Neurologic:  Alert marginally oriented) he knew it was 2016, and that he is in the hospital, but could not name the hospital for tell me what month it is). He has significant visual deficit in his right eye. No obvious focal motor deficits. Skin:  Intact without significant lesions or rashes. Cervical Nodes:  No significant cervical adenopathy. Psych:   Alert and cooperative. Normal mood and affect.  Intake/Output from previous day: 10/09 0701 - 10/10 0700 In: 1388.8 [I.V.:1158.8; Blood:230] Out: 1200 [Urine:200; Stool:1000] Intake/Output this shift: Total I/O In: 561.7 [P.O.:120; I.V.:225; Blood:216.7] Out: 250 [Stool:250]  Lab  Results:  Recent Labs  05/14/15 0320 05/14/15 1417  05/14/15 2215 05/15/15 0001 05/15/15 0614  WBC 12.3* 12.0*  --   --   --  10.2  HGB 12.4* 12.7*  < > 11.3* 10.8* 9.8*  HCT 37.0* 38.3*  < > 33.4* 32.0* 29.8*  PLT 241 262  --   --   --  236  < > = values in this interval not displayed. BMET  Recent Labs  05/14/15 0320 05/14/15 1417 05/14/15 1424 05/15/15 0614  NA 138 134* 137 139  K 4.4 4.0 4.0 3.9  CL 101 100* 102 109  CO2 28 25  --  23  GLUCOSE 109* 120* 119* 98  BUN 11 14 16 15   CREATININE 1.09 1.08 1.10 0.96  CALCIUM 8.9 8.8*  --  8.2*   LFT  Recent Labs  05/14/15 1417  PROT 6.8  ALBUMIN 3.3*  AST 69*  ALT 37  ALKPHOS 43  BILITOT 1.1   PT/INR  Recent Labs  05/14/15 1417  LABPROT 14.5  INR 1.11    Studies/Results: Ct Head Wo Contrast  05/14/2015   CLINICAL DATA:  Code stroke. Heart catheterization with subsequent onset of headache and bilateral blurred vision  EXAM: CT HEAD WITHOUT CONTRAST  TECHNIQUE: Contiguous axial images were obtained from the base of the skull through the vertex without intravenous contrast.  COMPARISON:  06/22/2014  FINDINGS: No sign of acute infarction by CT. 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 This is not acute. No hydrocephalus. No hemorrhage. No mass lesion. No calvarial abnormality of an acute nature. Sinuses are clear. There is fluid in the middle ears bilaterally.  IMPRESSION: No acute finding by CT. 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  These results were called by telephone at the time of interpretation  on 05/14/2015 at 2:36 pm to Dr. Orlie Dakin, who verbally acknowledged these results.   Electronically  Signed   By: Nelson Chimes M.D.   On: 05/14/2015 14:39    Impression: 1. Recurrent hematochezia, which I suspect most likely is related to post polypectomy bleeding. He is still within the 2 week window from multiple polypectomies. 2. Posthemorrhagic anemia, moderate 3. Status post recent MI with drug-eluting stent and need for antiplatelets therapy (currently interrupted) . Has had nonsustained ventricular tachycardia and has significant decrement in ejection fraction. 4. Recent stroke manifesting as transient confusion and persisting right sided visual deficit  Plan: 1. I would favor supportive care with transfusion as needed for another day or 2 2. If the patient has persistent or accelerating blood loss or evidence of hemodynamic instability related to hematochezia, I would favor doing a bleeding scan to try to localize the bleeding and to ascertain whether it is coming from his proximal or distal polypectomy sites, or from some other source. 3. With this patient's recent MI and stroke, he would be at high risk for colonoscopic intervention. However, polypectomy bleeding is often endoscopically remediable by placement of a clip, and if he is bleeding from one of his distal polypectomy sites, that may be amenable to treatment with minimal invasiveness (little or no need for sedation, little or no need for prep). Therefore, I think that the option of sigmoidoscopic or even colonoscopic intervention should be maintained, but deferred unless or until our hand is forced by persistent or destabilizing bleeding. 4. Lastly is the question as to whether or when to restart the patient's antiplatelets therapy. I would like to discuss this with cardiology, since there are pros and cons to leaving him off treatment or to restarting it.   LOS: 1 day   [place] V  05/15/2015, 11:07 AM   Pager [phone] If no answer or after 5 PM call [phone]

## 2015-05-15 NOTE — Progress Notes (Signed)
PT Cancellation Note  Patient Details Name: Damon Walker MRN: [id number] DOB: [birth year]   Cancelled Treatment:    Reason Eval/Treat Not Completed: Patient not medically ready.  Pt currently on strict bedrest post tPA.  Please advance activity order once appropriate for PT and mobility.     [name], [place] 05/15/2015, 8:17 AM

## 2015-05-15 NOTE — Progress Notes (Signed)
STROKE TEAM PROGRESS NOTE   HISTORY Damon Walker is an 69 y.o. male with a past medical history significant for HTN, hyperlipidemia, prediabetes, carotid artery disease, smoking, CAD s/p cath and stenting via right radial artery on 05/11/15,STEMI, ischemic cardiomyopathy, presents to the ED with his wife for evaluation of acute onset confusion and visual impairment. Never had similar symptoms before. Patient was discharged from the hospital today (05/14/2015), went home, finished having lunch with his wife, when she noted that he was all of the sudden confused and few minutes later he began complaining of decreased vision in the right visual field. Denies associated HA, vertigo, double vision, focal weakness or numbness,slurred speech, language impairment of unsteadiness. Patient on aspirin and plavix. NIHSS 4. CT brain showed no acute abnormality. He was  LSN 05/14/15 at 12 pm. Patient was administered IV TPA and was admitted to the neuro ICU for further evaluation and treatment.   SUBJECTIVE (INTERVAL HISTORY) No family at bedside. Post tPA, patient developed acute LGIB with Hgb drop. FFP x 2 administered. Still has ongoing LGIB. GI on board. Had recent stent for MI on ASA and brilinta, currently on hold due to tPA. Pt still has right hemianopia. CTA head neck and MRI pending.   OBJECTIVE Temp:  [97.5 F (36.4 C)-98.7 F (37.1 C)] 98.6 F (37 C) (10/10 0747) Pulse Rate:  [59-87] 71 (10/10 0711) Cardiac Rhythm:  [-] Normal sinus rhythm (10/09 2000) Resp:  [12-28] 16 (10/10 0711) BP: (90-153)/(35-74) 113/52 mmHg (10/10 0711) SpO2:  [95 %-100 %] 100 % (10/10 0711) Weight:  [87.488 kg (192 lb 14 oz)-89 kg (196 lb 3.4 oz)] 89 kg (196 lb 3.4 oz) (10/09 1545)  CBC:  Recent Labs Lab 05/14/15 1417  05/15/15 0001 05/15/15 0614  WBC 12.0*  --   --  10.2  NEUTROABS 8.3*  --   --   --   HGB 12.7*  < > 10.8* 9.8*  HCT 38.3*  < > 32.0* 29.8*  MCV 93.6  --   --  93.7  PLT 262  --   --  236   < > = values in this interval not displayed.  Basic Metabolic Panel:  Recent Labs Lab 05/11/15 0630  05/14/15 1417 05/14/15 1424 05/15/15 0614  NA 136  < > 134* 137 139  K 3.9  < > 4.0 4.0 3.9  CL 100*  < > 100* 102 109  CO2 24  < > 25  --  23  GLUCOSE 138*  < > 120* 119* 98  BUN 8  < > 14 16 15   CREATININE 1.00  < > 1.08 1.10 0.96  CALCIUM 8.7*  < > 8.8*  --  8.2*  MG 2.0  --   --   --   --   < > = values in this interval not displayed.  Lipid Panel:    Component Value Date/Time   CHOL 163 05/15/2015 0237   TRIG 144 05/15/2015 0237   HDL 23* 05/15/2015 0237   CHOLHDL 7.1 05/15/2015 0237   VLDL 29 05/15/2015 0237   LDLCALC 111* 05/15/2015 0237   HgbA1c:  Lab Results  Component Value Date   HGBA1C 6.0* 05/11/2015   Urine Drug Screen:    Component Value Date/Time   LABOPIA NONE DETECTED 05/14/2015 1904   COCAINSCRNUR NONE DETECTED 05/14/2015 1904   LABBENZ NONE DETECTED 05/14/2015 1904   AMPHETMU NONE DETECTED 05/14/2015 1904   THCU NONE DETECTED 05/14/2015 1904   LABBARB NONE DETECTED 05/14/2015  1904      IMAGING  Dg Chest 1 View  05/11/2015   IMPRESSION: Increased interstitial densities in both lungs. Findings are suggestive for interstitial pulmonary edema.  Ct Head Wo Contrast  05/14/2015  IMPRESSION: No acute finding by CT. 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    Ct Angio head and Neck W/cm &/or Wo/cm  05/15/2015   IMPRESSION: 1. Acute left PCA branch infarct involving the occipital lobe. No hemorrhagic conversion post tPA. 2. Poor flow in the non dominant right vertebral artery, favor high-grade proximal stenosis over dissection. 3. Mild to moderate left P1 segment stenosis. No treatable flow limiting intracranial stenosis. 4. Extensive atherosclerosis, predominately extracranial. Proximal left ICA stenosis approaching 50%. 5. 3  mm left paraclinoid ICA aneurysm.      2D Echo  05/13/2015 - Left ventricle: The cavity size was normal. Wall thickness wasincreased in a pattern of mild LVH. Systolic function was severely reduced. The estimated ejection fraction was in therange of 20% to 25%. Diastolic function is abnormal, indeterminate grade. - Regional wall motion abnormality: Akinesis of the mid-apicalanterior, basal-mid anteroseptal, mid anterolateral, apicalseptal, apical lateral, and apical myocardium. - Aortic valve: Valve area (VTI): 2.76 cm^2. Valve area (Vmax):3.36 cm^2. - Systemic veins: IVC is small suggesting low RA pressures andhypovolemia.  2D ehco 05/15/15 - Left ventricle: The cavity size was normal. Wall thickness was increased in a pattern of mild LVH. Systolic function was moderately reduced. The estimated ejection fraction was in the range of 35% to 40%. There is moderate hypokinesis of the mid-apicalanteroseptal myocardium. There is moderate hypokinesis of the anterior and apical myocardium. Doppler parameters are consistent with abnormal left ventricular relaxation (grade 1 diastolic dysfunction). - Left atrium: The atrium was mildly dilated. - Right atrium: The atrium was mildly dilated.  PHYSICAL EXAM  Temp:  [97.5 F (36.4 C)-98.7 F (37.1 C)] 98.7 F (37.1 C) (10/10 1145) Pulse Rate:  [59-96] 88 (10/10 1500) Resp:  [12-28] 22 (10/10 1500) BP: (93-153)/(35-74) 118/49 mmHg (10/10 1500) SpO2:  [95 %-100 %] 97 % (10/10 1500)  General - Well nourished, well developed, in no apparent distress.  Ophthalmologic - Fundi not visualized due to eye movement.  Cardiovascular - Regular rate and rhythm with no murmur.  Mental Status -  Level of arousal and orientation to place, and person were intact, not orientated to time Language including expression, naming, repetition, comprehension was assessed and found intact. Fund of Knowledge was assessed and was  impaired.  Cranial Nerves II - XII - II - right homonymous hemianopia. III, IV, VI - Extraocular movements intact. V - Facial sensation intact bilaterally. VII - Facial movement intact bilaterally. VIII - Hearing & vestibular intact bilaterally. X - Palate elevates symmetrically. XI - Chin turning & shoulder shrug intact bilaterally. XII - Tongue protrusion intact.  Motor Strength - The patient's strength was normal in all extremities and pronator drift was absent.  Bulk was normal and fasciculations were absent.   Motor Tone - Muscle tone was assessed at the neck and appendages and was normal.  Reflexes - The patient's reflexes were 1+ in all extremities and he had no pathological reflexes.  Sensory - Light touch, temperature/pinprick were assessed and were symmetrical.    Coordination - The patient had normal movements in the hands and feet with no ataxia or dysmetria.  Tremor was absent.  Gait and Station -  deferred due to active lower GI bleeding.   ASSESSMENT/PLAN Damon Walker is a 69 y.o. male with history of HTN, hyperlipidemia, prediabetes ,carotid artery disease, smoking, CAD s/p cath and stenting via right radial artery on 05/11/15, STEMI, ischemic cardiomyopathy presenting with acute onset confusion and right hemianopia. He received IV t-PA 05/14/2015 at 3pm. He developed symptomatic LGIB post tPA. Was given 2 FFP. BP stable. GI and cardiology on board.  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 Diffuse atherosclerosis.   2D Echo  20-25% on 05/13/15 but 35-40% 05/15/15  LDL 186  HgbA1c 6.0  SCDs for VTE prophylaxis  Diet Heart Room service appropriate?: Yes; Fluid consistency:: Thin  aspirin 81 mg orally every day and Brilinta 90 prior to admission, now on no antithrombotic as withint 24h of tPA and LGIB.   Ongoing  aggressive stroke risk factor management  Therapy recommendations:  pending   Disposition:  pending  ([place] living in [place])  Symptomatic post tPA LGIB  Bloody stools with clots during the night  Hgb 12.7->9.8  S/p 2 u FFP  On aspirin and Brilinta at admission, currently on hold due to [facility]  GI consult requested, considering bleeding from polypectomy.  Supportive care  CBC monitoring  On protonix.  Recent NSTEMI w/ ischemic cardiomyopathy & stenting and NSVT  Coronary artery disease - Oct 2016 NSTEMI s/p DES  Ischemic cardiomyopahty w/ EF 20-25%. Considering  Lifevest  EF improved with treatment to 35-40%  ASA and brilinta on hold due to tPA  Cardiology on board  Will start plavix after loading 24 hour post tPA  Hypertension  Stable Hold off BP meds due to LGIB   Hyperlipidemia  Home meds:  crestor 40, resumed in hospital  LDL 186, goal < 70  Continue statin at discharge  Tobacco abuse  Current smoker  Smoking cessation counseling provided  Pt is willing to quit  Other Stroke Risk Factors  Advance age  Hospital day # 1  This patient is critically ill due to left PCA stroke, recent MI s/p stent, tPA administration, lower GI bleeding post TPA requiring blood transfusion and at significant risk of neurological worsening, death form recurrent stroke, recurrent MRI, GI bleeding, anemia, hypotension, shock. This patient's care requires constant monitoring of vital signs, hemodynamics, respiratory and cardiac monitoring, review of multiple databases, neurological assessment, discussion with family, other specialists and medical decision making of high complexity. I spent 45 minutes of neurocritical care time in the care of this patient.   [name], MD PhD Stroke Neurology 05/15/2015 4:19 PM   To contact Stroke Continuity provider, please refer to [email or web address] After hours, contact General Neurology

## 2015-05-15 NOTE — Progress Notes (Signed)
Utilization review completed. Coralie Stanke, RN, BSN. 

## 2015-05-15 NOTE — Consult Note (Signed)
CARDIOLOGY CONSULT NOTE   Patient ID: [facility]on [facility] MRN: [id number] DOB/AGE: 10/22/1945 69 y.o.  Admit date: 05/14/2015  Primary Physician   Damon Pepper, MD Primary Cardiologist   Dr [facility] Reason for Consultation   Recent stent, GIB  XNA:Damon Walker is a 22 y.o. year old male with a 1 year history of HTN, HL, pre-DM, tob use, mod carotid dz. Prior to that, no medical care in 25 years or so.  He had multiple polypectomies at the end of September, 10 days ago per Dr Buccini's note.  Admitted 10/06>>10/09 with anterior STEMI, s/p DES x 2 to the LAD. 3.0 x 38 mm Promus stent distally and 3.0 x 16 mm Promus stent proximally in an overlapping fashion. Residual disease of 90% in the RCA and 80% in the OM3 to be initially treated medically. EF 35-45% at cath, 20-25% by echo.  2 hours after d/c on 10/09, [facility] had confusion and blurred vision, a possible CVA. He came back to the ER. He was evaluated by Neurology and received TPA.  About 5 hours after TPA, [facility] developed hematochezia and was seen by GI. Recommendations include supportive care with transfusions PRN for a day or 2. Bleeding scan to localize the bleeding. Consider colonoscopy if bleeding can be localized and may be treatable by clipping, etc.     Cardiology asked to evaluate him. [facility] has not had chest pain since he got the stents. He denies SOB. His memory is poor for recent events and he is confused by all the recent procedures. He generally feels bad, but is having no specific discomfort.    Past Medical History  Diagnosis Date  . Untreated Hypertension   . Tobacco abuse     a. 30 yrs - 1.5 ppd.  Marland Kitchen HLD (hyperlipidemia)   . Carotid arterial disease ([facility])   . CAD (coronary artery disease)     Cath 05/11/2015 three-vessel CAD with 90% prox RCA, 80% mid RCA, 80% OM3, 100% prox LAD occlusion treated with DESx2. RCA and OM residual treated medically for now, however will need PCI if has  recurrent CP  . Prediabetes     A1C 6.0 in Oct 2016  . Ischemic cardiomyopathy     EF 40% on cath 05/11/2015 after anterior STEMI, EF 20-25% on echo 05/13/2015     Past Surgical History  Procedure Laterality Date  . S/p appendectomy      Age 69  . S/p inguinal hernia repair      In his 20's.  . Cardiac catheterization N/A 05/11/2015    Procedure: Left Heart Cath and Coronary Angiography;  Surgeon: Damon M Martinique, MD;  Location: [facility];  Service: Cardiovascular;  Laterality: N/A;  . Cardiac catheterization N/A 05/11/2015    Procedure: Coronary Stent Intervention;  Surgeon: Damon M Martinique, MD;  Location: [facility];  Service: Cardiovascular;  Laterality: N/A;    Allergies  Allergen Reactions  . Latex Itching and Rash    I have reviewed the patient's current medications . pantoprazole (PROTONIX) IV  40 mg Intravenous QHS  . rosuvastatin  40 mg Oral q1800   . sodium chloride 75 mL/hr at 05/15/15 1000   acetaminophen **OR** acetaminophen, labetalol, senna-docusate  Prior to Admission medications   Medication Sig Start Date End Date Taking? Authorizing Provider  aspirin 81 MG chewable tablet Chew 1 tablet (81 mg total) by mouth daily. 05/14/15   Almyra Deforest, PA  carvedilol (COREG) 3.125  MG tablet Take 3 tablets (9.375 mg total) by mouth 2 (two) times daily with a meal. 05/14/15   Almyra Deforest, PA  furosemide (LASIX) 20 MG tablet Take 1 tablet (20 mg total) by mouth as needed for fluid or edema (for edema and shortness of breath). 05/14/15   Almyra Deforest, PA  lisinopril (PRINIVIL,ZESTRIL) 5 MG tablet Take 1 tablet (5 mg total) by mouth daily. 05/14/15   Almyra Deforest, PA  nitroGLYCERIN (NITROSTAT) 0.4 MG SL tablet Place 1 tablet (0.4 mg total) under the tongue every 5 (five) minutes x 3 doses as needed for chest pain. 05/14/15   Almyra Deforest, PA  pantoprazole (PROTONIX) 40 MG tablet Take 40 mg by mouth 2 (two) times daily.    Historical Provider, MD  rosuvastatin (CRESTOR) 40 MG tablet Take 1  tablet (40 mg total) by mouth daily at 6 PM. 05/14/15   Almyra Deforest, PA  ticagrelor (BRILINTA) 90 MG TABS tablet Take 1 tablet (90 mg total) by mouth 2 (two) times daily. 05/14/15   Almyra Deforest, PA     Social History   Social History  . Marital Status: Married    Spouse Name: N/A  . Number of Children: 3  . Years of Education: N/A   Occupational History  . retired from [employer]  . Smoking status: Current Every Day Smoker -- 1.00 packs/day for 30 years    Types: Cigarettes  . Smokeless tobacco: Not on file  . Alcohol Use: No  . Drug Use: No  . Sexual Activity: Not on file   Other Topics Concern  . Not on file   Social History Narrative   Lives in [place] with wife. Originally from [place].    Family Status  Relation Status Death Age  . Mother Deceased    Family History  Problem Relation Age of Onset  . Adopted: Yes  . Other      Adopted - unaware of biological parent's histories.  . Other Mother     eczema   . Cancer Mother      ROS:  Full 14 point review of systems complete and found to be negative unless listed above.  Physical Exam: Blood pressure 115/41, pulse 74, temperature 98.7 F (37.1 C), temperature source Oral, resp. rate 22, height 5\' 8"  (1.727 m), weight 196 lb 3.4 oz (89 kg), SpO2 98 %.  General: Well developed, well nourished, male in no acute distress Head: Eyes PERRLA, No xanthomas.   Normocephalic and atraumatic, oropharynx without edema or exudate. Dentition:  Lungs:  Heart: HRRR S1 S2, no rub/gallop, Heart irregular rate and rhythm with S1, S2  murmur. pulses are 2+ extrem.   Neck: No carotid bruits. No lymphadenopathy.  JVD. Abdomen: Bowel sounds present, abdomen soft and non-tender without masses or hernias noted. Msk:  No spine or cva tenderness. No weakness, no joint deformities or effusions. Extremities: No clubbing or cyanosis.  edema.  Neuro: Alert and oriented X 3. No focal deficits noted. Psych:  Good  affect, responds appropriately Skin: No rashes or lesions noted.  Labs:   [facility] Results  Component Value Date   WBC 10.2 05/15/2015   HGB 9.8* 05/15/2015   HCT 29.8* 05/15/2015   MCV 93.7 05/15/2015   PLT 236 05/15/2015    Recent Labs  05/14/15 1417  INR 1.11    Recent Labs Walker 05/14/15 1417  05/15/15 0614  NA 134*  < > 139  K 4.0  < >  3.9  CL 100*  < > 109  CO2 25  --  23  BUN 14  < > 15  CREATININE 1.08  < > 0.96  CALCIUM 8.8*  --  8.2*  PROT 6.8  --   --   BILITOT 1.1  --   --   ALKPHOS 43  --   --   ALT 37  --   --   AST 69*  --   --   GLUCOSE 120*  < > 98  ALBUMIN 3.3*  --   --   < > = values in this interval not displayed. MAGNESIUM  Date Value Ref Range Status  05/11/2015 2.0 1.7 - 2.4 mg/dL Final    Recent Labs  05/14/15 1422  TROPIPOC 28.16*   B NATRIURETIC PEPTIDE  Date/Time Value Ref Range Status  05/11/2015 06:30 AM 104.1* 0.0 - 100.0 pg/mL Final   Walker Results  Component Value Date   CHOL 163 05/15/2015   HDL 23* 05/15/2015   LDLCALC 111* 05/15/2015   TRIG 144 05/15/2015   [facility] Results  Component Value Date   DDIMER 0.63* 06/22/2014   TSH  Date/Time Value Ref Range Status  05/11/2015 06:30 AM 1.006 0.350 - 4.500 uIU/mL Final  06/23/2014 04:07 PM 1.670 0.350 - 4.500 uIU/mL Final   Echo: 05/13/2015 - Left ventricle: The cavity size was normal. Wall thickness was increased in a pattern of mild LVH. Systolic function was severely reduced. The estimated ejection fraction was in the range of 20% to 25%. Diastolic function is abnormal, indeterminate grade. - Regional wall motion abnormality: Akinesis of the mid-apical anterior, basal-mid anteroseptal, mid anterolateral, apical septal, apical lateral, and apical myocardium. - Aortic valve: Valve area (VTI): 2.76 cm^2. Valve area (Vmax): 3.36 cm^2. - Systemic veins: IVC is small suggesting low RA pressures and hypovolemia.  ECG:  05/14/2015 SR, anterior T wave  inversions  Cardiac Cath:   Prox RCA lesion, 90% stenosed.  Mid RCA lesion, 80% stenosed.  Dist RCA lesion, 40% stenosed.  Ost 2nd Mrg to 2nd Mrg lesion, 45% stenosed.  3rd Mrg lesion, 80% stenosed.  There is moderate left ventricular systolic dysfunction.  Prox LAD lesion, 100% stenosed. Post intervention, there is a 0% residual stenosis.  Lat 2nd Mrg lesion, 80% stenosed. 1. 3 vessel obstructive CAD. The proximal LAD is occluded and is the culprit vessel. Severe disease in the proximal and mid RCA. There is also severe disease in the small branches of the LCx. 2. Moderate LV dysfunction. EF estimated at 40%. 3. Elevated LV EDP 4. Successful stenting of the proximal to mid LAD with DES x 2. Plan: DAPT indefinitely. Patient is high risk and also has PAD. Aggressive risk factor modification. Will continue IV cangrelor for 2 hours and re-administer loading dose of Brilinta. Will need to consider intervention of the RCA depending on clinical course. This is a diffusely diseased artery and will require extensive stenting.   Radiology:  Ct Head Wo Contrast  05/14/2015   CLINICAL DATA:  Code stroke. Heart catheterization with subsequent onset of headache and bilateral blurred vision  EXAM: CT HEAD WITHOUT CONTRAST  TECHNIQUE: Contiguous axial images were obtained from the base of the skull through the vertex without intravenous contrast.  COMPARISON:  06/22/2014  FINDINGS: No sign of acute infarction by CT. 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 This is not acute. No hydrocephalus. No hemorrhage. No mass lesion. No calvarial abnormality of an acute nature. Sinuses are clear. There is fluid in the middle ears bilaterally.  IMPRESSION: No acute finding by CT. 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  These results were called by telephone at the time of interpretation on 05/14/2015 at 2:36 pm to Dr. Orlie Dakin, who verbally acknowledged these results.   Electronically Signed   By: Nelson Chimes M.D.   On: 05/14/2015 14:39    ASSESSMENT AND PLAN:   The patient was seen today by Dr [facility], the patient evaluated and the data reviewed.  Principal Problem:   Stroke with cerebral ischemia ([place]) - mgt per Neuro - s/p TPA  Active Problems:   HTN (hypertension) - good control on current rx    Tobacco abuse - cessation encouraged    ST elevation myocardial infarction (STEMI) of anterior wall, subsequent episode of care ([place]) - DAPT on hold. - s/p DES x 2 to the LAD with sig residual dz in the RCA and OM3 (med rx) - ICM w/ EF 20-25% by echo - now improved - we can stop the ASA - not sure he would survive another MI, so we need to keep the stents open - Would convert to Plavix instead of Brilinta - if the bleeding was due to the TPA, on top of the polypectopy, he may tolerate the antiplatelet meds once his polypectomy sites heal. - however, continuing to hold the Plavix increases the chance his stents will close, a week or more is too long.    Lower GI bleed - mgt per GI - unclear how long the RBC scan will take and then a possible colonoscopy   Signed: Rosaria Ferries, PA-C 05/15/2015 1:00 PM Beeper [phone]  Co-Sign MD  => Echo results:  EF 35-40% (up from 20-25%) with Mod. HK of mid-apical anteroseptal & anterior walls. Gr. 1 DD.   I have seen, examined and evaluated the patient this PM along with Ms. Ahmed Prima.  After reviewing all the available data and chart,  I agree with her findings, examination as well as impression recommendations.   Very difficult scenario with CVA post MI. I would imagine that his GIB was as a results of TPA which has made a  dramatic in Neurologic recovery.  Hgb levels, while lower, are still stable.  Bloody stools have slowed.   Hopefully, the conservative approach will help.\   Thankfully - his EF by Echo has improved 1 week post STEMI. - unfortunately, he does have 2 fresh DES stents & existing CAD that is severe - I think the risks of continuing to hold Antiplatelet Rx for GI Bleed outweigh the  Cardiac benefits.  __> Agree with starting Plavix today(with bolus 300mg  "loading dose") & start 75 mg daily in AM.  OK to hold ASA for now. Monitor for recurrent GIB.  I suspect that as the TPA effect wears off, the GIB will slow. -- he did not bleed on ASA & Brilinta prior to TPA.  Continue Statin. When OK per Neuro with desire for CVA related permissive HTN - would restart BB dosing.  Will follow along.  Leonie Man, M.D., M.S. Interventional Cardiologist   Pager # [phone]

## 2015-05-15 NOTE — Plan of Care (Signed)
Problem: tPA Day Progression Outcomes-Only if tPA administered Goal: Post tPA monitor for S/S of bleeding Outcome: Progressing Rectal bleeding. MD stewart aware

## 2015-05-15 NOTE — Progress Notes (Signed)
  Echocardiogram 2D Echocardiogram has been performed.  Donata Clay 05/15/2015, 11:02 AM

## 2015-05-15 NOTE — Telephone Encounter (Signed)
Spoke to wife ,she states he was admitted to hospital for stroke  Yesterday. He was discharge from the hospital - heart attack - readmitted

## 2015-05-16 DIAGNOSIS — H539 Unspecified visual disturbance: Secondary | ICD-10-CM

## 2015-05-16 DIAGNOSIS — I69398 Other sequelae of cerebral infarction: Secondary | ICD-10-CM

## 2015-05-16 DIAGNOSIS — I2510 Atherosclerotic heart disease of native coronary artery without angina pectoris: Secondary | ICD-10-CM | POA: Diagnosis present

## 2015-05-16 DIAGNOSIS — Z955 Presence of coronary angioplasty implant and graft: Secondary | ICD-10-CM

## 2015-05-16 DIAGNOSIS — I10 Essential (primary) hypertension: Secondary | ICD-10-CM

## 2015-05-16 DIAGNOSIS — D62 Acute posthemorrhagic anemia: Secondary | ICD-10-CM

## 2015-05-16 DIAGNOSIS — Z720 Tobacco use: Secondary | ICD-10-CM

## 2015-05-16 LAB — PREPARE FRESH FROZEN PLASMA
UNIT DIVISION: 0
Unit division: 0

## 2015-05-16 LAB — BASIC METABOLIC PANEL WITH GFR
Anion gap: 7 (ref 5–15)
BUN: 9 mg/dL (ref 6–20)
CO2: 22 mmol/L (ref 22–32)
Calcium: 8.2 mg/dL — ABNORMAL LOW (ref 8.9–10.3)
Chloride: 109 mmol/L (ref 101–111)
Creatinine, Ser: 0.73 mg/dL (ref 0.61–1.24)
GFR calc Af Amer: >60 mL/min
GFR calc non Af Amer: >60 mL/min
Glucose, Bld: 101 mg/dL — ABNORMAL HIGH (ref 65–99)
Potassium: 3.8 mmol/L (ref 3.5–5.1)
Sodium: 138 mmol/L (ref 135–145)

## 2015-05-16 LAB — CBC
HEMATOCRIT: 29.2 % — AB (ref 39.0–52.0)
HEMOGLOBIN: 9.5 g/dL — AB (ref 13.0–17.0)
MCH: 30.9 pg (ref 26.0–34.0)
MCHC: 32.5 g/dL (ref 30.0–36.0)
MCV: 95.1 fL (ref 78.0–100.0)
Platelets: 222 10*3/uL (ref 150–400)
RBC: 3.07 MIL/uL — ABNORMAL LOW (ref 4.22–5.81)
RDW: 13.2 % (ref 11.5–15.5)
WBC: 13.3 10*3/uL — ABNORMAL HIGH (ref 4.0–10.5)

## 2015-05-16 LAB — HEMOGLOBIN A1C
Hgb A1c MFr Bld: 6.2 % — ABNORMAL HIGH (ref 4.8–5.6)
MEAN PLASMA GLUCOSE: 131 mg/dL

## 2015-05-16 LAB — GLUCOSE, CAPILLARY
GLUCOSE-CAPILLARY: 98 mg/dL (ref 65–99)
Glucose-Capillary: 127 mg/dL — ABNORMAL HIGH (ref 65–99)

## 2015-05-16 LAB — HEMOGLOBIN: Hemoglobin: 9.1 g/dL — ABNORMAL LOW (ref 13.0–17.0)

## 2015-05-16 MED ORDER — ASPIRIN EC 81 MG PO TBEC
81.0000 mg | DELAYED_RELEASE_TABLET | Freq: Every day | ORAL | Status: DC
  Administered 2015-05-16 – 2015-05-18 (×3): 81 mg via ORAL
  Filled 2015-05-16 (×4): qty 1

## 2015-05-16 MED ORDER — LABETALOL HCL 5 MG/ML IV SOLN
10.0000 mg | INTRAVENOUS | Status: DC | PRN
Start: 2015-05-16 — End: 2015-05-18

## 2015-05-16 MED ORDER — POLYETHYLENE GLYCOL 3350 17 G PO PACK: 17.0000 g | PACK | Freq: Once | ORAL | Status: DC

## 2015-05-16 MED ORDER — POLYETHYLENE GLYCOL 3350 17 G PO PACK
17.0000 g | PACK | Freq: Once | ORAL | Status: AC
Start: 2015-05-16 — End: 2015-05-16
  Administered 2015-05-16: 17 g via ORAL
  Filled 2015-05-16: qty 1

## 2015-05-16 NOTE — Progress Notes (Signed)
Patient arrived to 33m05 at 1500. Patient alert and oriented X4 with no c/o pain. Vital signs taken and charted. Telemetry box 06  applied and CCMD notified. Oriented to room with bed alarm on . wife at bedside.

## 2015-05-16 NOTE — Evaluation (Signed)
Speech Language Pathology Evaluation Patient Details Name: [facility] MRN: [id number] DOB: 02-08-1946 Today's Date: 05/16/2015 Time: 4268-3419 SLP Time Calculation (min) (ACUTE ONLY): 19 min  Problem List:  Patient Active Problem List   Diagnosis Date Noted  . Coronary artery disease involving native coronary artery of native heart without angina pectoris 05/16/2015  . ST elevation myocardial infarction (STEMI) of anterior wall, subsequent episode of care ([place]) 05/15/2015  . Lower GI bleed 05/15/2015  . Stroke with cerebral ischemia ([place]) 05/14/2015  . Carotid arterial disease ([place]) 05/11/2015  . ST elevation (STEMI) myocardial infarction involving left anterior descending coronary artery ([place]) 05/11/2015  . Presence of drug coated stent in LAD coronary artery 05/08/2015  . HTN (hypertension) 07/05/2014  . HLD (hyperlipidemia) 07/05/2014  . Tobacco abuse 07/05/2014  . Pre-syncope 06/22/2014   Past Medical History:  Past Medical History  Diagnosis Date  . Untreated Hypertension   . Tobacco abuse     a. 30 yrs - 1.5 ppd.  Marland Kitchen HLD (hyperlipidemia)   . Carotid arterial disease ([place])   . CAD (coronary artery disease)     Cath 05/11/2015 three-vessel CAD with 90% prox RCA, 80% mid RCA, 80% OM3, 100% prox LAD occlusion treated with DESx2. RCA and OM residual treated medically for now, however will need PCI if has recurrent CP  . Prediabetes     A1C 6.0 in Oct 2016  . Ischemic cardiomyopathy     EF 40% on cath 05/11/2015 after anterior STEMI, EF 20-25% on echo 05/13/2015   Past Surgical History:  Past Surgical History  Procedure Laterality Date  . S/p appendectomy      Age 69  . S/p inguinal hernia repair      In his 20's.  . Cardiac catheterization N/A 05/11/2015    Procedure: Left Heart Cath and Coronary Angiography;  Surgeon: Peter M Martinique, MD;  Location: [facility];  Service: Cardiovascular;  Laterality: N/A;  . Cardiac catheterization N/A 05/11/2015    Procedure:  Coronary Stent Intervention;  Surgeon: Peter M Martinique, MD;  Location: [facility];  Service: Cardiovascular;  Laterality: N/A;   HPI:  [facility] is an 69 y.o. male with a PMH significant for HTN, hyperlipidemia, prediabetes, carotid artery disease, smoking, CAD s/p cath and stenting via right radial artery on 05/11/15, STEMI, ischemic cardiomyopathy, presented to the ED with his wife for evaluation of acute onset confusion and visual impairment. TPA was administered. CT brain showed no acute abnormality. MRI Head showed  Acute infarct left occipital lobe, chronic microvascular ischemic changes in the white matter and left pons. encephalomalacia right temporal and frontal lobe is chronic and unchanged. This may be due to in utero infarct.   Assessment / Plan / Recommendation Clinical Impression  Pt presents with left PCA CVA impacting vision, functional reading and writing, and short-term memory (storage/retrieval); pt does describe less clarity with thinking than usual.  There is no dysarthria; language is intact.  Recommend SLP f/u at CIR level to facilitate independence and focus on the impairments described.      SLP Assessment  Patient needs continued Speech Lanaguage Pathology Services    Follow Up Recommendations  Inpatient Rehab    Frequency and Duration min 2x/week  1 week   Pertinent Vitals/Pain Pain Assessment: No/denies pain   SLP Goals  Potential to Achieve Goals (ACUTE ONLY): Good  SLP Evaluation Prior Functioning  Cognitive/Linguistic Baseline: Within functional limits Type of Home: House Available Help at Discharge: Family;Available PRN/intermittently  Cognition  Overall Cognitive Status: Impaired/Different from baseline Arousal/Alertness: Awake/alert Orientation Level: Oriented to person;Oriented to place;Disoriented to time Attention: Sustained Sustained Attention: Appears intact Memory: Impaired Memory Impairment: Storage deficit;Retrieval  deficit Awareness: Impaired Awareness Impairment: Emergent impairment    Comprehension  Auditory Comprehension Overall Auditory Comprehension: Appears within functional limits for tasks assessed Reading Comprehension Reading Status: Impaired Word level: Impaired Sentence Level: Impaired Paragraph Level: Impaired Functional Environmental (signs, name badge): Impaired    Expression Expression Primary Mode of Expression: Verbal Verbal Expression Overall Verbal Expression: Appears within functional limits for tasks assessed Written Expression Dominant Hand: Right Written Expression: Exceptions to [facility] Self Formulation Ability: Phrase   Oral / Motor Oral Motor/Sensory Function Overall Oral Motor/Sensory Function: Appears within functional limits for tasks assessed Motor Speech Overall Motor Speech: Appears within functional limits for tasks assessed   [name] L. Tivis Ringer, [place] CCC/SLP Pager [phone]      Juan Quam [facility] 05/16/2015, 3:30 PM

## 2015-05-16 NOTE — Progress Notes (Signed)
No bleeding for at least the past 12 hours. Hemoglobin has leveled off at 9.1.   Patient denies chest pain or shortness of breath.   Dr. 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    I have discussed this plan with the patient's neurologist and cardiologist and they are in agreement.   I have also discussed this plan with the patient's wife by telephone and she is agreeable and will call the nursing station to give telephone consent later this evening.   Accordingly, I will make the patient nothing by mouth after midnight, and we will gently prep the patient with  MiraLAX.  Cleotis Nipper, M.D. Pager [phone] If no answer or after 5 PM call [phone]

## 2015-05-16 NOTE — Progress Notes (Signed)
Patient Profile: 69 y/o male, with recent multiple polypectomies by GI less than 2 weeks ago as  well as recent admission 10/6-10/9 with anterior STEMI, s/p DES x 2 to the LAD. 3.0 x 38 mm Promus stent distally and 3.0 x 16 mm Promus stent proximally in an overlapping fashion. Residual disease of 90% in the RCA and 80% in the OM3 to be initially treated medically. EF 35-45% at cath, 20-25% by echo.  2 hrs after discharge, he developed confusion and blurred vision. CT of head negative in the ED. Seen by neurology and left PCA distribution infarct suspected. TPA given.   Post tPA, patient developed acute LGIB with Hgb drop from 13-->9.   Subjective: No complaints. Denies CP or dyspnea. No hematochezia. No further confusion or visual deficits. No weakness.   Objective: Vital signs in last 24 hours: Temp:  [98.5 F (36.9 C)-99.4 F (37.4 C)] 99.4 F (37.4 C) (10/11 0734) Pulse Rate:  [76-93] 93 (10/11 1501) Resp:  [14-32] 20 (10/11 1501) BP: (102-150)/(50-81) 108/56 mmHg (10/11 1501) SpO2:  [93 %-100 %] 100 % (10/11 1501) Last BM Date: 05/15/15  Intake/Output from previous day: 10/10 0701 - 10/11 0700 In: 1987.9 [P.O.:120; I.V.:1651.3; Blood:216.7] Out: 725 [Urine:200; Stool:525] Intake/Output this shift: Total I/O In: 830 [P.O.:380; I.V.:450] Out: 150 [Urine:150]  Medications Current Facility-Administered Medications  Medication Dose Route Frequency Provider Last Rate Last Dose  . 0.9 %  sodium chloride infusion   Intravenous Continuous [name], MD 75 mL/hr at 05/16/15 1300    . acetaminophen (TYLENOL) tablet 650 mg  650 mg Oral Q4H PRN [name], MD      . aspirin EC tablet 81 mg  81 mg Oral Daily [name], MD   81 mg at 05/16/15 1100  . clopidogrel (PLAVIX) tablet 75 mg  75 mg Oral Daily [name], MD   75 mg at 05/16/15 1100  . labetalol (NORMODYNE,TRANDATE) injection 10 mg  10 mg Intravenous Q2H PRN [name], MD      . pantoprazole (PROTONIX) EC  tablet 40 mg  40 mg Oral Daily [name], MD   40 mg at 05/16/15 1100  . rosuvastatin (CRESTOR) tablet 40 mg  40 mg Oral q1800 [name], MD   40 mg at 05/15/15 1824  . senna-docusate (Senokot-S) tablet 1 tablet  1 tablet Oral QHS PRN [name], MD        PE: General appearance: alert, cooperative and no distress Neck: no carotid bruit and no JVD Lungs: clear to auscultation bilaterally Heart: regular rate and rhythm Extremities: no LEE Pulses: 2+ and symmetric Skin: warm and dry Neurologic: Grossly normal  Lab Results:   Recent Labs  05/14/15 1417  05/15/15 0001 05/15/15 0614 05/15/15 1716 05/16/15 0330  WBC 12.0*  --   --  10.2  --  13.3*  HGB 12.7*  < > 10.8* 9.8* 9.5* 9.5*  HCT 38.3*  < > 32.0* 29.8*  --  29.2*  PLT 262  --   --  236  --  222  < > = values in this interval not displayed. BMET  Recent Labs  05/14/15 1417 05/14/15 1424 05/15/15 0614 05/16/15 0330  NA 134* 137 139 138  K 4.0 4.0 3.9 3.8  CL 100* 102 109 109  CO2 25  --  23 22  GLUCOSE 120* 119* 98 101*  BUN 14 16 15 9   CREATININE 1.08 1.10 0.96 0.73  CALCIUM 8.8*  --  8.2* 8.2*   PT/INR  Recent Labs  05/14/15 1417  LABPROT 14.5  INR 1.11   Cholesterol  Recent Labs  05/15/15 0237  CHOL 163    Assessment/Plan  Principal Problem:   Stroke with cerebral ischemia (HCC) Active Problems:   Coronary artery disease involving native coronary artery of native heart without angina pectoris   Presence of drug coated stent in LAD coronary artery   HTN (hypertension)   Tobacco abuse   ST elevation myocardial infarction (STEMI) of anterior wall, subsequent episode of care [facility])   Lower GI bleed   1. CAD: s/p recent STEMI treated with DES x 2 to the LAD. 3.0 x 38 mm Promus stent distally and 3.0 x 16 mm Promus stent proximally in an overlapping fashion. Also with residual disease of 90% in the RCA and 80% in the OM3, treated medically. He was reloaded with Plavix yesterday, 300 mg.  75 mg ordered for today. Hgb remained stable overnight at 9.5. Continue Plavix given newly placed DES. Continue to monitor for signs of active bleeding. He is stable w/o CP or dyspnea. Continue ASA, Plavix and statin. BP too soft for BB and ACE-I, currently.   2. CVA: s/p tPA. Neurological deficits resolved.   3. LGIB: followed by Dr. Cristina Gong. Hgb stable overnight at 9.5. Felt secondary to post polypectomy bleeding and recent tPA. Per GI note, plan currently is for supportive care and transfusion if needed. GI will continue to follow and will make further recommendations.   4. Anemia: post hemorrhagic anemia, moderate. Hgb stable overnight at 9.5. Plavix restarted for CAD/coronary stenting. Continue to monitor closely.   5. Systolic Dysfunction: EF 70-26% at cath, 20-25% by echo. Volume is stable. NSR on telemetry. BP is too soft for BB and ACE-I currently.  No CHF Sx.   LOS: 2 days    Brittainy M. Rosita Fire, PA-C 05/16/2015 10:30 AM   I have seen, examined and evaluated the patient this PM along with Ms. Rosita Fire, [place].  After reviewing all the available data and chart,  I agree with her findings, examination as well as impression recommendations.  --  Hgb remains stable x 24 hr - no further bleeding. ] Plavix loaded yesterday  - now on daily dosing.  He has significant existing CAD, but is not actively symptomatic == should be ok for Flex Sigmoidoscopy tomorrow - would avoid oversedation / hypotension as he could become ischemic.  As BP will tolerate, would like to restart BB 1st then ACE-I/ARB. On statin.  Remains stable from CV standpoint - will continue to monitor.   Leonie Man, M.D., M.S. Interventional Cardiologist   Pager # [phone]

## 2015-05-16 NOTE — Progress Notes (Signed)
Inpatient Rehabilitation  I met with the patient and his wife at the bedside to discuss potential CIR admission.  I provided informational handouts and answered their questions.  Pt. And wife would like to pursue IP Rehab admission.  I have initiated the insurance process and await their decision as well as medical readiness and bed availability.  Please call if questions.  [facility] Admissions Coordinator Cell [phone] Office [phone]

## 2015-05-16 NOTE — Evaluation (Signed)
Physical Therapy Evaluation Patient Details Name: Damon Walker MRN: [id number] DOB: 1945-11-11 Today's Date: 05/16/2015   History of Present Illness  pt presents with L PCA Infarct, GIB, and recent admit 10/6 - 10/9 for MI.  pt with hx of HTN and CAD.    Clinical Impression  Pt generally unsteady and with cognitive difficulties.  Pt has poor recall of information given to him at beginning of session to end of session.  Feel pt will need CIR level of therapies to maximize independence prior to returning to home.      Follow Up Recommendations CIR    Equipment Recommendations  None recommended by PT    Recommendations for Other Services Rehab consult     Precautions / Restrictions Precautions Precautions: Fall;Other (comment) Precaution Comments: R visual deficits. Restrictions Weight Bearing Restrictions: No      Mobility  Bed Mobility Overal bed mobility: Needs Assistance Bed Mobility: Supine to Sit     Supine to sit: Min guard     General bed mobility comments: When pt is given increased time pt is able to struggle to sit at EOB without A, but seems difficult for pt.    Transfers Overall transfer level: Needs assistance Equipment used: None Transfers: Sit to/from Stand Sit to Stand: Min assist         General transfer comment: pt with L lateral lean with coming to stand and needs cueing to correct posture.    Ambulation/Gait Ambulation/Gait assistance: Min assist Ambulation Distance (Feet): 75 Feet Assistive device: None Gait Pattern/deviations: Step-through pattern;Decreased stride length;Narrow base of support;Drifts right/left     General Gait Details: pt drifts to L side and at times runs into objects on L side.  pt needs verbal and tactile cueing for attending to R side and correcting L lateral lean.  pt with difficulty in conversing while ambulating and other multitasking tasks.    Stairs            Wheelchair Mobility    Modified Rankin  (Stroke Patients Only) Modified Rankin (Stroke Patients Only) Pre-Morbid Rankin Score: No significant disability Modified Rankin: Moderately severe disability     Balance Overall balance assessment: Needs assistance Sitting-balance support: No upper extremity supported;Feet supported Sitting balance-Leahy Scale: Good   Postural control: Left lateral lean Standing balance support: No upper extremity supported;During functional activity Standing balance-Leahy Scale: Fair                               Pertinent Vitals/Pain Pain Assessment: No/denies pain    Home Living Family/patient expects to be discharged to:: Private residence Living Arrangements: Spouse/significant other Available Help at Discharge: Family;Available PRN/intermittently (Wife works) Type of Home: House Home Access: Level entry     Home Layout: Multi-level;1/2 bath on main level [place]: None      Prior Function Level of Independence: Independent               Hand Dominance        Extremity/Trunk Assessment   Upper Extremity Assessment: Defer to OT evaluation           Lower Extremity Assessment: RLE deficits/detail RLE Deficits / Details: Mild weakness grossly 4/5 and decreased coordination.      Cervical / Trunk Assessment: Normal  Communication   Communication: No difficulties  Cognition Arousal/Alertness: Awake/alert Behavior During Therapy: WFL for tasks assessed/performed Overall Cognitive Status: Impaired/Different from baseline Area of Impairment: Orientation;Memory;Safety/judgement;Awareness;Problem solving;Attention Orientation  Level: Disoriented to;Situation;Time Current Attention Level: Selective Memory: Decreased short-term memory;Decreased recall of precautions   Safety/Judgement: Decreased awareness of safety;Decreased awareness of deficits Awareness: Emergent Problem Solving: Slow processing;Difficulty sequencing;Requires verbal cues;Requires  tactile cues General Comments: pt unable to recall situation even when he was told situation ~36mins prior.  pt unable to recall month or day of the month, but with increased time was able to recall year.      General Comments      Exercises        Assessment/Plan    PT Assessment Patient needs continued PT services  PT Diagnosis Difficulty walking   PT Problem List Decreased strength;Decreased activity tolerance;Decreased balance;Decreased mobility;Decreased coordination;Decreased cognition;Decreased safety awareness  PT Treatment Interventions DME instruction;Gait training;Stair training;Functional mobility training;Therapeutic activities;Therapeutic exercise;Balance training;Neuromuscular re-education;Cognitive remediation;Patient/family education   PT Goals (Current goals can be found in the Care Plan section) Acute Rehab PT Goals Patient Stated Goal: Back to normal PT Goal Formulation: With patient Time For Goal Achievement: 05/30/15 Potential to Achieve Goals: Good    Frequency Min 4X/week   Barriers to discharge        Co-evaluation               End of Session Equipment Utilized During Treatment: Gait belt Activity Tolerance: Patient tolerated treatment well Patient left: in chair;with call bell/phone within reach Nurse Communication: Mobility status         Time: 0814-4818 PT Time Calculation (min) (ACUTE ONLY): 24 min   Charges:   PT Evaluation $Initial PT Evaluation Tier I: 1 Procedure PT Treatments $Gait Training: 8-22 mins   PT G CodesCatarina Hartshorn, [place] 05/16/2015, 9:45 AM

## 2015-05-16 NOTE — Progress Notes (Addendum)
STROKE TEAM PROGRESS NOTE   SUBJECTIVE (INTERVAL HISTORY) Wife is at bedside. No LGIB for the last 12 hours, Hb stable at 9.5. On plavix, and will also resume ASA. GI on board, will still like to check rectoscopy.    OBJECTIVE Temp:  [98.5 F (36.9 C)-99.4 F (37.4 C)] 99.4 F (37.4 C) (10/11 0734) Pulse Rate:  [76-96] 81 (10/11 1200) Cardiac Rhythm:  [-] Normal sinus rhythm (10/11 1000) Resp:  [14-32] 26 (10/11 1200) BP: (102-150)/(49-81) 130/61 mmHg (10/11 1200) SpO2:  [93 %-99 %] 99 % (10/11 1200)  CBC:  Recent Labs Lab 05/14/15 1417  05/15/15 0614 05/15/15 1716 05/16/15 0330  WBC 12.0*  --  10.2  --  13.3*  NEUTROABS 8.3*  --   --   --   --   HGB 12.7*  < > 9.8* 9.5* 9.5*  HCT 38.3*  < > 29.8*  --  29.2*  MCV 93.6  --  93.7  --  95.1  PLT 262  --  236  --  222  < > = values in this interval not displayed.  Basic Metabolic Panel:  Recent Labs Lab 05/11/15 0630  05/15/15 0614 05/16/15 0330  NA 136  < > 139 138  K 3.9  < > 3.9 3.8  CL 100*  < > 109 109  CO2 24  < > 23 22  GLUCOSE 138*  < > 98 101*  BUN 8  < > 15 9  CREATININE 1.00  < > 0.96 0.73  CALCIUM 8.7*  < > 8.2* 8.2*  MG 2.0  --   --   --   < > = values in this interval not displayed.  Lipid Panel:     Component Value Date/Time   CHOL 163 05/15/2015 0237   TRIG 144 05/15/2015 0237   HDL 23* 05/15/2015 0237   CHOLHDL 7.1 05/15/2015 0237   VLDL 29 05/15/2015 0237   LDLCALC 111* 05/15/2015 0237   HgbA1c:  Lab Results  Component Value Date   HGBA1C 6.2* 05/15/2015   Urine Drug Screen:     Component Value Date/Time   LABOPIA NONE DETECTED 05/14/2015 1904   COCAINSCRNUR NONE DETECTED 05/14/2015 1904   LABBENZ NONE DETECTED 05/14/2015 1904   AMPHETMU NONE DETECTED 05/14/2015 1904   THCU NONE DETECTED 05/14/2015 1904   LABBARB NONE DETECTED 05/14/2015 1904      IMAGING I have personally reviewed the radiological images below and agree with the radiology interpretations.  Dg Chest 1  View  05/11/2015   IMPRESSION: Increased interstitial densities in both lungs. Findings are suggestive for interstitial pulmonary edema.  Ct Head Wo Contrast  05/14/2015  IMPRESSION: No acute finding by CT. 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    Ct Angio head and Neck W/cm &/or Wo/cm  05/15/2015   IMPRESSION: 1. Acute left PCA branch infarct involving the occipital lobe. No hemorrhagic conversion post tPA. 2. Poor flow in the non dominant right vertebral artery, favor high-grade proximal stenosis over dissection. 3. Mild to moderate left P1 segment stenosis. No treatable flow limiting intracranial stenosis. 4. Extensive atherosclerosis, predominately extracranial. Proximal left ICA stenosis approaching 50%. 5. 3 mm left paraclinoid ICA aneurysm.      2D Echo  05/13/2015 - Left ventricle: The cavity size was normal. Wall thickness wasincreased in a pattern of mild LVH. Systolic function was severely reduced. The estimated  ejection fraction was in therange of 20% to 25%. Diastolic function is abnormal, indeterminate grade. - Regional wall motion abnormality: Akinesis of the mid-apicalanterior, basal-mid anteroseptal, mid anterolateral, apicalseptal, apical lateral, and apical myocardium. - Aortic valve: Valve area (VTI): 2.76 cm^2. Valve area (Vmax):3.36 cm^2. - Systemic veins: IVC is small suggesting low RA pressures andhypovolemia.  2D ehco 05/15/15 - Left ventricle: The cavity size was normal. Wall thickness was increased in a pattern of mild LVH. Systolic function was moderately reduced. The estimated ejection fraction was in the range of 35% to 40%. There is moderate hypokinesis of the mid-apicalanteroseptal myocardium. There is moderate hypokinesis of the anterior and apical myocardium. Doppler parameters are consistent with abnormal left  ventricular relaxation (grade 1 diastolic dysfunction). - Left atrium: The atrium was mildly dilated. - Right atrium: The atrium was mildly dilated.  PHYSICAL EXAM  Temp:  [98.5 F (36.9 C)-99.4 F (37.4 C)] 99.4 F (37.4 C) (10/11 0734) Pulse Rate:  [76-96] 81 (10/11 1200) Resp:  [14-32] 26 (10/11 1200) BP: (102-150)/(49-81) 130/61 mmHg (10/11 1200) SpO2:  [93 %-99 %] 99 % (10/11 1200)  General - Well nourished, well developed, in no apparent distress.  Ophthalmologic - Fundi not visualized due to eye movement.  Cardiovascular - Regular rate and rhythm with no murmur.  Mental Status -  Level of arousal and orientation to place, and person were intact, not orientated to time. Language including expression, naming, repetition, comprehension was assessed and found intact. Fund of Knowledge was assessed and was impaired.  Cranial Nerves II - XII - II - right homonymous hemianopia. III, IV, VI - Extraocular movements intact. V - Facial sensation intact bilaterally. VII - Facial movement intact bilaterally. VIII - Hearing & vestibular intact bilaterally. X - Palate elevates symmetrically. XI - Chin turning & shoulder shrug intact bilaterally. XII - Tongue protrusion intact.  Motor Strength - The patient's strength was normal in all extremities and pronator drift was absent.  Bulk was normal and fasciculations were absent.   Motor Tone - Muscle tone was assessed at the neck and appendages and was normal.  Reflexes - The patient's reflexes were 1+ in all extremities and he had no pathological reflexes.  Sensory - Light touch, temperature/pinprick were assessed and were symmetrical.    Coordination - The patient had normal movements in the hands and feet with no ataxia or dysmetria.  Tremor was absent.  Gait and Station -  deferred due to active lower GI bleeding.   ASSESSMENT/PLAN [name] is a 69 y.o. male with history of HTN, hyperlipidemia, prediabetes  ,carotid artery disease, smoking, CAD s/p cath and stenting via right radial artery on 05/11/15, STEMI, ischemic cardiomyopathy presenting with acute onset confusion and right hemianopia. He received IV t-PA 05/14/2015 at 3pm. He developed symptomatic LGIB post tPA. Was given 2 FFP. BP stable. GI and cardiology on board.  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 Diffuse atherosclerosis.   2D Echo  20-25% on 05/13/15 but 35-40% 05/15/15  LDL 186  HgbA1c 6.0  SCDs for VTE prophylaxis Diet Heart Room service appropriate?: Yes; Fluid consistency:: Thin  aspirin 81 mg orally every day and Brilinta 90 prior to admission, now on no antithrombotic as withint 24h of tPA and LGIB.   Ongoing aggressive stroke risk factor management  Therapy recommendations:  CIR   Disposition:  pending  ([place] living in [place])  Symptomatic post tPA LGIB  Bloody stools with clots during the night  Hgb 12.7->9.8->9.5->9.5  S/p 2U FFP  On aspirin and Brilinta at admission, currently on hold due to [facility]  GI consult requested, considering bleeding from polypectomy.  Supportive care  CBC monitoring  On protonix.  Recent NSTEMI w/ ischemic cardiomyopathy & stenting and NSVT  Coronary artery disease - Oct 2016 NSTEMI s/p DES  Ischemic cardiomyopahty w/ EF 20-25%. Considering  Lifevest  EF improved with treatment to 35-40%  ASA and brilinta on hold due to tPA  Cardiology on board  started plavix after loading 24 hour post tPA  Will start baby ASA too for stroke and cardiac prevention.  Hypertension  Stable, and more on the low normal range Hold off BP meds due to LGIB   Hyperlipidemia  Home meds:  crestor 40, resumed in hospital  LDL 186, goal < 70  Continue statin at discharge  Tobacco abuse  Current smoker  Smoking cessation counseling  provided  Pt is willing to quit  Other Stroke Risk Factors  Advance age  No driving due to right hemianopia. Need follow up with OT and ophthal - pt and wife informed  Hospital day # 2  This patient is critically ill due to left PCA stroke, recent MI s/p stent, tPA administration, lower GI bleeding post TPA requiring blood transfusion and at significant risk of neurological worsening, death form recurrent stroke, recurrent MRI, GI bleeding, anemia, hypotension, shock. This patient's care requires constant monitoring of vital signs, hemodynamics, respiratory and cardiac monitoring, review of multiple databases, neurological assessment, discussion with family, other specialists and medical decision making of high complexity. I spent 35 minutes of neurocritical care time in the care of this patient.   [name], MD PhD Stroke Neurology 05/16/2015 12:50 PM   To contact Stroke Continuity provider, please refer to [email or web address] After hours, contact General Neurology

## 2015-05-16 NOTE — Consult Note (Signed)
Physical Medicine and Rehabilitation Consult   Reason for Consult:  Right HH and confusion Referring Physician: Dr. Erlinda Hong.   HPI: Damon Walker is a 69 y.o. RH-male with history of HTN, prediabetes, CAD with ICM s/p cath with stenting for STEMI on 05/11/15. He was discharged to home on 10/09 am and developed sudden onset of confusion with right visual field deficits after lunch.  He was re-admitted for work up and CCT negative for acute changes.  NIHSS 4 and he was treated with IV tPA due to suspicion of L-PCA infarct.  He developed LGIB with multiple episodes of  rectal bleeding with clots post tPA. He had drop in H/H and was treated with FFP but continued to have bleeding episodes.  Dr. Cristina Gong was consulted for input and recommended supportive care with serial monitoring of H/H with transfusion for next 1-2 days and bleeding scan for work up in case of medical instability.   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  Dr. Erlinda Hong felt that patient with cardioembolic  L-PCA infarct due to low EF and recent MI.  Repeat 2D echo with EF 35-40% with moderate hypokinesis of mid-apical anterior and lateral septal myocardium.  Cardiology consulted and plavix resumed yesterday.   Patient with resultant confusion, Right HH and poor awarenss of deficits.  PT evaluation done this am and CIR recommended for follow up therapy.  Pt lives in a 3 story home, with a bed/bath on the second level with 5 steps.    Review of Systems  Constitutional: Negative for diaphoresis.  HENT: Negative for hearing loss.   Eyes: Negative for pain.  Respiratory: Negative for cough and shortness of breath.   Cardiovascular: Negative for chest pain, palpitations and leg swelling.  Gastrointestinal: Positive for blood in stool (none since yesterday afternoon). Negative for heartburn and nausea.  Genitourinary: Negative  for dysuria and urgency.  Musculoskeletal: Positive for joint pain (history  of hip pain).  Neurological: Negative for dizziness, tingling and headaches.  Psychiatric/Behavioral: Positive for memory loss. Negative for depression. The patient is not nervous/anxious.   All other systems reviewed and are negative.     Past Medical History  Diagnosis Date  . Untreated Hypertension   . Tobacco abuse     a. 30 yrs - 1.5 ppd.  Marland Kitchen HLD (hyperlipidemia)   . Carotid arterial disease ([place])   . CAD (coronary artery disease)     Cath 05/11/2015 three-vessel CAD with 90% prox RCA, 80% mid RCA, 80% OM3, 100% prox LAD occlusion treated with DESx2. RCA and OM residual treated medically for now, however will need PCI if has recurrent CP  . Prediabetes     A1C 6.0 in Oct 2016  . Ischemic cardiomyopathy     EF 40% on cath 05/11/2015 after anterior STEMI, EF 20-25% on echo 05/13/2015    Past Surgical History  Procedure Laterality Date  . S/p appendectomy      Age 36  . S/p inguinal hernia repair      In his 20's.  . Cardiac catheterization N/A 05/11/2015    Procedure: Left Heart Cath and Coronary Angiography;  Surgeon: Peter M Martinique, MD;  Location: [facility];  Service: Cardiovascular;  Laterality: N/A;  . Cardiac catheterization N/A 05/11/2015    Procedure: Coronary Stent Intervention;  Surgeon: Peter M Martinique, MD;  Location: [facility];  Service: Cardiovascular;  Laterality: N/A;    Family History  Problem Relation Age of Onset  . Adopted: Yes  . Other      Adopted - unaware of biological parent's histories.  . Other Mother     eczema   . Cancer Mother     Social History:  Cira Rue works days, but she states she is able to be at home for 24/7 support. 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  He reports that he has been smoking Cigarettes--1.5 PPD.  He has a 30 pack-year smoking history. He does not have any smokeless tobacco history on file. He reports that he does not drink alcohol  or use illicit drugs.   Allergies  Allergen Reactions  . Latex Itching and Rash    Medications Prior to Admission  Medication Sig Dispense Refill  . aspirin 81 MG chewable tablet Chew 1 tablet (81 mg total) by mouth daily.    . carvedilol (COREG) 3.125 MG tablet Take 3 tablets (9.375 mg total) by mouth 2 (two) times daily with a meal. 180 tablet 5  . furosemide (LASIX) 20 MG tablet Take 1 tablet (20 mg total) by mouth as needed for fluid or edema (for edema and shortness of breath). 30 tablet 3  . lisinopril (PRINIVIL,ZESTRIL) 5 MG tablet Take 1 tablet (5 mg total) by mouth daily. 30 tablet 11  . nitroGLYCERIN (NITROSTAT) 0.4 MG SL tablet Place 1 tablet (0.4 mg total) under the tongue every 5 (five) minutes x 3 doses as needed for chest pain. 25 tablet 3  . pantoprazole (PROTONIX) 40 MG tablet Take 40 mg by mouth 2 (two) times daily.    . rosuvastatin (CRESTOR) 40 MG tablet Take 1 tablet (40 mg total) by mouth daily at 6 PM. 90 tablet 3  . ticagrelor (BRILINTA) 90 MG TABS tablet Take 1 tablet (90 mg total) by mouth 2 (two) times daily. 60 tablet 11    Home: Home Living Family/patient expects to be discharged to:: Private residence Living Arrangements: Spouse/significant other Available Help at Discharge: Family, Available PRN/intermittently (Wife works) Type of Home: House Home Access: Level entry Home Layout: Multi-level, 1/2 bath on main level Alternate Level Stairs-Number of Steps: flight Alternate Level Stairs-Rails: Right Home Equipment: None  Functional History: Prior Function Level of Independence: Independent Functional Status:  Mobility: Bed Mobility Overal bed mobility: Needs Assistance Bed Mobility: Supine to Sit Supine to sit: Min guard General bed mobility comments: When pt is given increased time pt is able to struggle to sit at EOB without A, but seems difficult for pt.   Transfers Overall transfer level: Needs assistance Equipment used: None Transfers: Sit  to/from Stand Sit to Stand: Min assist General transfer comment: pt with L lateral lean with coming to stand and needs cueing to correct posture.   Ambulation/Gait Ambulation/Gait assistance: Min assist Ambulation Distance (Feet): 75 Feet Assistive device: None Gait Pattern/deviations: Step-through pattern, Decreased stride length, Narrow base of support, Drifts right/left General Gait Details: pt drifts to L side and at times runs into objects on L side.  pt needs verbal and tactile cueing for attending to R side and correcting L lateral lean.  pt with difficulty in conversing while ambulating and other multitasking tasks.      ADL:    Cognition: Cognition Overall Cognitive Status: Impaired/Different from baseline Orientation Level: Oriented to person, Oriented to place, Disoriented to time, Oriented to situation Cognition Arousal/Alertness: Awake/alert Behavior During Therapy: WFL for tasks assessed/performed Overall Cognitive Status: Impaired/Different from baseline Area of Impairment:  Orientation, Memory, Safety/judgement, Awareness, Problem solving, Attention Orientation Level: Disoriented to, Situation, Time Current Attention Level: Selective Memory: Decreased short-term memory, Decreased recall of precautions Safety/Judgement: Decreased awareness of safety, Decreased awareness of deficits Awareness: Emergent Problem Solving: Slow processing, Difficulty sequencing, Requires verbal cues, Requires tactile cues General Comments: pt unable to recall situation even when he was told situation ~74mins prior.  pt unable to recall month or day of the month, but with increased time was able to recall year.    Blood pressure 113/69, pulse 81, temperature 99.4 F (37.4 C), temperature source Oral, resp. rate 26, height 5\' 8"  (1.727 m), weight 89 kg (196 lb 3.4 oz), SpO2 98 %. Physical Exam  Nursing note and vitals reviewed. Constitutional: He is oriented to person, place, and time. He  appears well-developed and well-nourished.  HENT:  Head: Normocephalic and atraumatic.  Eyes: Conjunctivae are normal. Pupils are equal, round, and reactive to light.  Neck: Normal range of motion. Neck supple.  Cardiovascular: Normal rate and regular rhythm.   Respiratory: Effort normal and breath sounds normal. No respiratory distress. He has no wheezes.  GI: Soft. Bowel sounds are normal. He exhibits no distension. There is no tenderness.  Musculoskeletal: He exhibits no edema or tenderness.  Strength 4+/5 grossly throughout  Neurological: He is alert and oriented to person, place, and time. He displays normal reflexes.  Speech clear.  Was able to state age/DOB but had difficulty stating month or day.  Lacks awareness of deficits. Right HH noted. He was able to follow one and two step commands without difficulty.    Right sided neglect Sensation to light touch intact  Skin: Skin is warm and dry. No rash noted.  Psychiatric: He has a normal mood and affect. He is slowed.    Results for orders placed or performed during the hospital encounter of 05/14/15 (from the past 24 hour(s))  Glucose, capillary     Status: None   Collection Time: 05/15/15  4:05 PM  Result Value Ref Range   Glucose-Capillary 78 65 - 99 mg/dL  Hemoglobin     Status: Abnormal   Collection Time: 05/15/15  5:16 PM  Result Value Ref Range   Hemoglobin 9.5 (L) 13.0 - 17.0 g/dL  Glucose, capillary     Status: Abnormal   Collection Time: 05/15/15  8:09 PM  Result Value Ref Range   Glucose-Capillary 128 (H) 65 - 99 mg/dL  Basic metabolic panel     Status: Abnormal   Collection Time: 05/16/15  3:30 AM  Result Value Ref Range   Sodium 138 135 - 145 mmol/L   Potassium 3.8 3.5 - 5.1 mmol/L   Chloride 109 101 - 111 mmol/L   CO2 22 22 - 32 mmol/L   Glucose, Bld 101 (H) 65 - 99 mg/dL   BUN 9 6 - 20 mg/dL   Creatinine, Ser 0.73 0.61 - 1.24 mg/dL   Calcium 8.2 (L) 8.9 - 10.3 mg/dL   GFR calc non Af Amer >60 >60 mL/min     GFR calc Af Amer >60 >60 mL/min   Anion gap 7 5 - 15  CBC     Status: Abnormal   Collection Time: 05/16/15  3:30 AM  Result Value Ref Range   WBC 13.3 (H) 4.0 - 10.5 K/uL   RBC 3.07 (L) 4.22 - 5.81 MIL/uL   Hemoglobin 9.5 (L) 13.0 - 17.0 g/dL   HCT 29.2 (L) 39.0 - 52.0 %   MCV 95.1 78.0 - 100.0 fL  MCH 30.9 26.0 - 34.0 pg   MCHC 32.5 30.0 - 36.0 g/dL   RDW 13.2 11.5 - 15.5 %   Platelets 222 150 - 400 K/uL  Glucose, capillary     Status: None   Collection Time: 05/16/15  7:31 AM  Result Value Ref Range   Glucose-Capillary 98 65 - 99 mg/dL  Glucose, capillary     Status: Abnormal   Collection Time: 05/16/15 11:20 AM  Result Value Ref Range   Glucose-Capillary 127 (H) 65 - 99 mg/dL   Ct Angio Head W/cm &/or Wo Cm  05/15/2015   CLINICAL DATA:  24 hour follow-up for stroke with tPA.  EXAM: CT ANGIOGRAPHY HEAD AND NECK  TECHNIQUE: Multidetector CT imaging of the head and neck was performed using the standard protocol during bolus administration of intravenous contrast. Multiplanar CT image reconstructions and MIPs were obtained to evaluate the vascular anatomy. Carotid stenosis measurements (when applicable) are obtained utilizing NASCET criteria, using the distal internal carotid diameter as the denominator.  CONTRAST:  51mL OMNIPAQUE IOHEXOL 350 MG/ML SOLN  COMPARISON:  Head CT from yesterday  FINDINGS: CT HEAD  Brain: Cytotoxic edema in the left occipital lobe without hemorrhagic conversion.  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  Chronic small vessel ischemic changes throughout the bilateral cerebral white matter, mild for age.  Calvarium and skull base: No fracture or destructive process noted.  Paranasal sinuses: Opacification of the under aerated bilateral mastoids and middle ears. No gross erosive changes.  Orbits: Left cataract resection.  No acute finding.  CTA NECK  Aortic arch: Remarkably extensive and irregular noncalcified  plaque on the aortic arch. No visualized aneurysm or dissection. Three vessel branching.  Right carotid system: Diffuse predominantly noncalcified atherosclerosis with plaque irregularity along the posterior wall of the proximal ICA. Stenosis measures less than 50%.  Left carotid system: Extensive noncalcified atheromatous plaque with irregularity at the bifurcation. Stenosis approaches 50% at the proximal ICA. Moderate tortuosity of the ICA at the skullbase.  Vertebral arteries:Atheromatous but sufficiently patent subclavian arteries. Strong left vertebral artery dominance. Nonvisualized right V1 segment with symmetrically enhancing right V4 segment and graded underfilling and relative hypoenhancement proximally. Intermittent atherosclerotic plaque of the left vertebral artery without flow limiting stenosis.  Skeleton: No aggressive lytic or blastic process noted.  Other neck: No incidentally detected mass or adenopathy. Trace left pleural effusion posteriorly.  CTA HEAD  Anterior circulation: Atherosclerosis of the carotid siphons without flow limiting stenosis. 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 No major branch occlusion or treatable flow limiting stenosis.  Posterior circulation: Strongly dominant left vertebral artery. Dysplastic distal basilar with the left superior cerebellar artery from the left P1 segment. Mild atheromatous narrowing and irregularity of the left P1 segment. No treatable flow limiting stenosis. No proximal left PCA branch occlusion to suggest additional tissue at risk.  Venous sinuses: Patent  Anatomic variants: Left superior cerebellar artery from the P1 segment. Intact anterior communicating artery.  Delayed phase: No parenchymal enhancement  IMPRESSION: 1. Acute left PCA branch infarct involving the occipital lobe. No hemorrhagic conversion post tPA. 2. Poor flow in the non dominant right vertebral artery,  favor high-grade proximal stenosis over dissection. 3. Mild to moderate left P1 segment stenosis. No treatable flow limiting intracranial stenosis. 4. Extensive atherosclerosis, predominately extracranial. Proximal left ICA stenosis approaching 50%. 5. 3 mm left paraclinoid ICA aneurysm.   Electronically Signed  By: Monte Fantasia M.D.   On: 05/15/2015 15:12   Ct Head Wo Contrast  05/14/2015   CLINICAL DATA:  Code stroke. Heart catheterization with subsequent onset of headache and bilateral blurred vision  EXAM: CT HEAD WITHOUT CONTRAST  TECHNIQUE: Contiguous axial images were obtained from the base of the skull through the vertex without intravenous contrast.  COMPARISON:  06/22/2014  FINDINGS: No sign of acute infarction by CT. 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 This is not acute. No hydrocephalus. No hemorrhage. No mass lesion. No calvarial abnormality of an acute nature. Sinuses are clear. There is fluid in the middle ears bilaterally.  IMPRESSION: No acute finding by CT. 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  These results were called by telephone at the time of interpretation on 05/14/2015 at 2:36 pm to Dr. Orlie Dakin, who verbally acknowledged these results.   Electronically Signed   By: Nelson Chimes M.D.   On: 05/14/2015 14:39   Ct Angio Neck W/cm &/or Wo/cm  05/15/2015   CLINICAL DATA:  24 hour follow-up for stroke with tPA.  EXAM: CT ANGIOGRAPHY HEAD AND NECK  TECHNIQUE: Multidetector CT imaging of the head and neck was performed using the standard protocol during bolus administration of intravenous contrast. Multiplanar CT image reconstructions and MIPs were obtained to  evaluate the vascular anatomy. Carotid stenosis measurements (when applicable) are obtained utilizing NASCET criteria, using the distal internal carotid diameter as the denominator.  CONTRAST:  45mL OMNIPAQUE IOHEXOL 350 MG/ML SOLN  COMPARISON:  Head CT from yesterday  FINDINGS: CT HEAD  Brain: Cytotoxic edema in the left occipital lobe without hemorrhagic conversion.  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  Chronic small vessel ischemic changes throughout the bilateral cerebral white matter, mild for age.  Calvarium and skull base: No fracture or destructive process noted.  Paranasal sinuses: Opacification of the under aerated bilateral mastoids and middle ears. No gross erosive changes.  Orbits: Left cataract resection.  No acute finding.  CTA NECK  Aortic arch: Remarkably extensive and irregular noncalcified plaque on the aortic arch. No visualized aneurysm or dissection. Three vessel branching.  Right carotid system: Diffuse predominantly noncalcified atherosclerosis with plaque irregularity along the posterior wall of the proximal ICA. Stenosis measures less than 50%.  Left carotid system: Extensive noncalcified atheromatous plaque with irregularity at the bifurcation. Stenosis approaches 50% at the proximal ICA. Moderate tortuosity of the ICA at the skullbase.  Vertebral arteries:Atheromatous but sufficiently patent subclavian arteries. Strong left vertebral artery dominance. Nonvisualized right V1 segment with symmetrically enhancing right V4 segment and graded underfilling and relative hypoenhancement proximally. Intermittent atherosclerotic plaque of the left vertebral artery without flow limiting stenosis.  Skeleton: No aggressive lytic or blastic process noted.  Other neck: No incidentally detected mass or adenopathy. Trace left pleural effusion posteriorly.  CTA HEAD  Anterior circulation: Atherosclerosis of the carotid siphons without flow limiting  stenosis. 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 No major branch occlusion or treatable flow limiting stenosis.  Posterior circulation: Strongly dominant left vertebral artery. Dysplastic distal basilar with the left superior cerebellar artery from the left P1  segment. Mild atheromatous narrowing and irregularity of the left P1 segment. No treatable flow limiting stenosis. No proximal left PCA branch occlusion to suggest additional tissue at risk.  Venous sinuses: Patent  Anatomic variants: Left superior cerebellar artery from the P1 segment. Intact anterior communicating artery.  Delayed phase: No parenchymal enhancement  IMPRESSION: 1. Acute left PCA branch infarct involving the occipital lobe. No hemorrhagic conversion post tPA. 2. Poor flow in the non dominant right vertebral artery, favor high-grade proximal stenosis over dissection. 3. Mild to moderate left P1 segment stenosis. No treatable flow limiting intracranial stenosis. 4. Extensive atherosclerosis, predominately extracranial. Proximal left ICA stenosis approaching 50%. 5. 3 mm left paraclinoid ICA aneurysm.   Electronically Signed   By: Monte Fantasia M.D.   On: 05/15/2015 15:12   Mr Brain Wo Contrast  05/15/2015   CLINICAL DATA:  Stroke. Hypertension, hyperlipidemia, pre diabetes. LAD stenting 05/11/2015  EXAM: MRI HEAD WITHOUT CONTRAST  TECHNIQUE: Multiplanar, multiecho pulse sequences of the brain and surrounding structures were obtained without intravenous contrast.  COMPARISON:  CT head 05/15/2015  FINDINGS: Acute left PCA infarct involving the left occipital cortex. No other acute infarct  Encephalomalacia in the right temporal and frontal lobe. This is chronic and unchanged from prior studies. Adjacent brain appears normal without cortical encephalomalacia. There is a membrane through the encephalomalacia which may be the arachnoid. No associated hemorrhage. This  could be an in utero infarct.  Chronic microvascular ischemic change in the white matter. Chronic infarct in the left pons.  Negative for hemorrhage or mass lesion.  Negative for hydrocephalus.  IMPRESSION: Acute infarct left occipital lobe. Chronic microvascular ischemic changes in the white matter and left pons  Encephalomalacia right temporal and frontal lobe is chronic and unchanged. This may be due to in utero infarct.   Electronically Signed   By: Franchot Gallo M.D.   On: 05/15/2015 16:56    Assessment/Plan: Diagnosis: Left occipital CVA Labs and images independently reviewed.  Old records reviewed and summated above. Stroke: Continue secondary stroke prophylaxis and Risk Factor Modification listed below:   Antiplatelet therapy:  ASA, Clopidogrel Blood Pressure Management:  Continue current medication with prn's with permisive HTN per primary team (duration and range). PRN labetolol Statin Agent:  Crestor Diabetes management:  Diet and excercise Tobacco abuse:  Consider counseling PT/OT for mobility, ADL training    1. Does the need for close, 24 hr/day medical supervision in concert with the patient's rehab needs make it unreasonable for this patient to be served in a less intensive setting? Yes 2. Co-Morbidities requiring supervision/potential complications: HTN, recent STEMI, GI bleed, confusion, tachypnea, leukocytosis, ABLA.   3. Due to safety, disease management, medication administration and patient education, does the patient require 24 hr/day rehab nursing? Yes 4. Does the patient require coordinated care of a physician, rehab nurse, PT (1-2 hrs/day, 5 days/week), OT (1-2 hrs/day, 5 days/week) and SLP (1-2 hrs/day, 5 days/week) to address physical and functional deficits in the context of the above medical diagnosis(es)? Yes Addressing deficits in the following areas: balance, endurance, locomotion, transferring, dressing, feeding, grooming, toileting, cognition, language and  psychosocial support 5. Can the patient actively participate in an intensive therapy program of at least 3 hrs of therapy per day at least 5 days per week? Potentially 6. The potential for patient to make measurable gains while on inpatient rehab is good 7. Anticipated functional outcomes upon discharge from inpatient rehab are modified independent and supervision  with PT, modified independent and supervision  with OT, independent with SLP. 8. Estimated rehab length of stay to reach the above functional goals is: 7-10 days 9. Does the patient have adequate social supports and living environment to accommodate these discharge functional goals? Potentially 10. Anticipated D/C setting: Home 11. Anticipated post D/C treatments: HH therapy and Home excercise program 12. Overall Rehab/Functional Prognosis: good  RECOMMENDATIONS: This patient's condition is appropriate for continued rehabilitative care in the following setting: CIR Patient has agreed to participate in recommended program. Yes Note that insurance prior authorization may be required for reimbursement for recommended care.  Comment: Rehab Admissions Coordinator to follow up   Delice Lesch, MD 05/16/2015

## 2015-05-16 NOTE — Progress Notes (Signed)
Pt wife called and gave RN telephone permission to sign informed consent for scheduled flexible sigmoidoscopy for pt Klie[place] on 05/17/2015. Fortino Sic, RN will sign informed consent on behalf of patient, with verbal permission given by wife. Fortino Sic, RN, BSN 05/16/2015 8:02 PM

## 2015-05-16 NOTE — Progress Notes (Signed)
Occupational Therapy Evaluation Patient Details Name: Damon Walker MRN: [id number] DOB: [birth year] Today's Date: 05/16/2015    History of Present Illness pt presents with L PCA Infarct, GIB, and recent admit 10/6 - 10/9 for MI.  pt with hx of HTN and CAD.     Clinical Impression   PTA, pt independent with ADL and mobility. Pt with apparent R homonymous hemianopsia in addition to below deficits. Pt will benefit from rehab at CIR. Will follow acutely to address established goals.     Follow Up Recommendations  CIR;Supervision/Assistance - 24 hour    Equipment Recommendations  3 in 1 bedside comode    Recommendations for Other Services Rehab consult     Precautions / Restrictions Precautions Precautions: Fall;Other (comment) Precaution Comments: R visual deficits. Restrictions Weight Bearing Restrictions: No      Mobility Bed Mobility Overal bed mobility: Needs Assistance Bed Mobility: Supine to Sit     Supine to sit: Supervision        Transfers Overall transfer level: Needs assistance Equipment used: None   Sit to Stand: Min assist              Balance     Sitting balance-Leahy Scale: Good       Standing balance-Leahy Scale: Fair                              ADL Overall ADL's : Needs assistance/impaired     Grooming: Set up;Supervision/safety   Upper Body Bathing: Minimal assitance   Lower Body Bathing: Minimal assistance   Upper Body Dressing : Moderate assistance   Lower Body Dressing: Moderate assistance   Toilet Transfer: Minimal assistance           Functional mobility during ADLs: Minimal assistance General ADL Comments: Not seeing food on R half of his tray.     Vision Vision Assessment?: Yes Eye Alignment: Within Functional Limits Ocular Range of Motion: Within Functional Limits Alignment/Gaze Preference: Within Defined Limits Tracking/Visual Pursuits: Decreased smoothness of horizontal  tracking;Decreased smoothness of vertical tracking Saccades: Additional eye shifts occurred during testing Convergence: Within functional limits Visual Fields: Right homonymous hemianopsia   Perception Perception Perception Tested?: No   Praxis      Pertinent Vitals/Pain Pain Assessment: No/denies pain     Hand Dominance Right   Extremity/Trunk Assessment Upper Extremity Assessment Upper Extremity Assessment: Overall WFL for tasks assessed (will further assess)   Lower Extremity Assessment RLE Deficits / Details: Mild weakness grossly 4/5 and decreased coordination.   RLE Coordination: decreased fine motor   Cervical / Trunk Assessment Cervical / Trunk Assessment: Normal   Communication Communication Communication: No difficulties   Cognition Arousal/Alertness: Awake/alert Behavior During Therapy: WFL for tasks assessed/performed Overall Cognitive Status: Impaired/Different from baseline Area of Impairment: Orientation;Memory;Safety/judgement;Awareness;Problem solving;Attention Orientation Level: Disoriented to;Situation;Time Current Attention Level: Selective Memory: Decreased short-term memory;Decreased recall of precautions   Safety/Judgement: Decreased awareness of safety;Decreased awareness of deficits Awareness: Emergent Problem Solving: Slow processing;Difficulty sequencing;Requires verbal cues;Requires tactile cues General Comments: Pt unable to recount situation. states he is "confused"   General Comments       Exercises       Shoulder Instructions      Home Living Family/patient expects to be discharged to:: Private residence Living Arrangements: Spouse/significant other Available Help at Discharge: Family;Available PRN/intermittently (Wife works) Type of Home: House Home Access: Level entry     Home Layout: Multi-level;1/2 bath on main level Alternate  Level Stairs-Number of Steps: flight Alternate Level Stairs-Rails: Right           Home  Equipment: None          Prior Functioning/Environment Level of Independence: Independent             OT Diagnosis: Generalized weakness;Cognitive deficits;Disturbance of vision   OT Problem List: Decreased activity tolerance;Impaired balance (sitting and/or standing);Impaired vision/perception;Decreased cognition;Decreased safety awareness   OT Treatment/Interventions: Self-care/ADL training;DME and/or AE instruction;Therapeutic exercise;Therapeutic activities;Cognitive remediation/compensation;Visual/perceptual remediation/compensation;Patient/family education;Balance training    OT Goals(Current goals can be found in the care plan section) Acute Rehab OT Goals Patient Stated Goal: Back to normal OT Goal Formulation: Patient unable to participate in goal setting Time For Goal Achievement: 05/30/15 Potential to Achieve Goals: Good  OT Frequency: Min 2X/week   Barriers to D/C:            Co-evaluation              End of Session Equipment Utilized During Treatment: Gait belt Nurse Communication: Mobility status  Activity Tolerance: Patient tolerated treatment well Patient left: in chair;with call bell/phone within reach;with chair alarm set   Time: 1705-1730 OT Time Calculation (min): 25 min Charges:  OT General Charges $OT Visit: 1 Procedure OT Evaluation $Initial OT Evaluation Tier I: 1 Procedure OT Treatments $Self Care/Home Management : 8-22 mins G-Codes:    Ed Mandich,HILLARY 2015/05/23, 6:17 PM   [facility], OTR/L  [phone] 05/23/2015

## 2015-05-16 NOTE — Care Management Note (Signed)
Case Management Note  Patient Details  Name: Damon Walker MRN: [id number] Date of Birth: 11-May-1946  Subjective/Objective: Pt admitted on 05/14/15 with code stroke, visual impairment, and confusion.  PTA, pt independent, lives with spouse.                   Action/Plan: Will follow for discharge planning as pt progresses.  PT recommending CIR.    Expected Discharge Date:                  Expected Discharge Plan:  [place]  In-House Referral:     Discharge planning Services  CM Consult  Post Acute Care Choice:    Choice offered to:     DME Arranged:    DME Agency:     HH Arranged:    [place] Agency:     Status of Service:  In process, will continue to follow  Medicare Important Message Given:    Date Medicare IM Given:    Medicare IM give by:    Date Additional Medicare IM Given:    Additional Medicare Important Message give by:     If discussed at [place] of Stay Meetings, dates discussed:    Additional Comments:  Reinaldo Raddle, RN, BSN  Trauma/Neuro ICU Case Manager [phone]

## 2015-05-17 LAB — CBC
HEMATOCRIT: 28 % — AB (ref 39.0–52.0)
HEMOGLOBIN: 9.3 g/dL — AB (ref 13.0–17.0)
MCH: 31.3 pg (ref 26.0–34.0)
MCHC: 33.2 g/dL (ref 30.0–36.0)
MCV: 94.3 fL (ref 78.0–100.0)
Platelets: 244 10*3/uL (ref 150–400)
RBC: 2.97 MIL/uL — ABNORMAL LOW (ref 4.22–5.81)
RDW: 13.2 % (ref 11.5–15.5)
WBC: 12.2 10*3/uL — ABNORMAL HIGH (ref 4.0–10.5)

## 2015-05-17 LAB — BASIC METABOLIC PANEL
ANION GAP: 10 (ref 5–15)
BUN: 7 mg/dL (ref 6–20)
CO2: 26 mmol/L (ref 22–32)
Calcium: 8.4 mg/dL — ABNORMAL LOW (ref 8.9–10.3)
Chloride: 103 mmol/L (ref 101–111)
Creatinine, Ser: 0.92 mg/dL (ref 0.61–1.24)
GFR calc Af Amer: >60 mL/min (ref >60)
GFR calc non Af Amer: >60 mL/min (ref >60)
GLUCOSE: 107 mg/dL — AB (ref 65–99)
Potassium: 3.8 mmol/L (ref 3.5–5.1)
Sodium: 139 mmol/L (ref 135–145)

## 2015-05-17 LAB — HEMOGLOBIN: Hemoglobin: 8.7 g/dL — ABNORMAL LOW (ref 13.0–17.0)

## 2015-05-17 MED ORDER — FLEET ENEMA 7-19 GM/118ML RE ENEM
1.0000 | ENEMA | Freq: Once | RECTAL | Status: AC
  Administered 2015-05-18: 1 via RECTAL
  Filled 2015-05-17: qty 1

## 2015-05-17 MED ORDER — SODIUM CHLORIDE 0.9 % IV SOLN
INTRAVENOUS | Status: DC
  Administered 2015-05-17: 22:00:00 via INTRAVENOUS

## 2015-05-17 MED ORDER — SODIUM CHLORIDE 0.9 % IV SOLN
INTRAVENOUS | Status: DC
  Administered 2015-05-18: 07:00:00 via INTRAVENOUS

## 2015-05-17 NOTE — Progress Notes (Signed)
Physical Therapy Treatment Patient Details Name: Damon Walker MRN: [id number] DOB: 07-13-1946 Today's Date: 05/17/2015    History of Present Illness pt presents with L PCA Infarct, GIB, and recent admit 10/6 - 10/9 for MI.  pt with hx of HTN and CAD.      PT Comments    Pt progressing towards physical therapy goals. Questionable cognition at times as pt had obvious visual deficits but would verbally report none "I can see fine. I can see everything". Assist during gait training for balance and to guide pt as he frequently drifted L and was bumping into rails and objects in the hall. Continue to feel that pt would benefit from continued therapy services at the CIR level at d/c.   Follow Up Recommendations  CIR     Equipment Recommendations  None recommended by PT    Recommendations for Other Services Rehab consult     Precautions / Restrictions Precautions Precautions: Fall;Other (comment) Precaution Comments: R visual deficits. Restrictions Weight Bearing Restrictions: No    Mobility  Bed Mobility Overal bed mobility: Needs Assistance Bed Mobility: Supine to Sit     Supine to sit: Min guard     General bed mobility comments: Increased time, HOB elevated and use of rails. Pt was able to transition to EOB slowly with increased difficulty scooting out to get feet on floor. Close guard for safety.   Transfers Overall transfer level: Needs assistance Equipment used: None Transfers: Sit to/from Stand Sit to Stand: Min assist         General transfer comment: Steadying assist to power-up to full standing position. Demonstrates L lateral lean and increased time to achieve full upright posture prior to initiating gait.  Ambulation/Gait Ambulation/Gait assistance: Min assist Ambulation Distance (Feet): 250 Feet Assistive device: None Gait Pattern/deviations: Step-through pattern;Decreased stride length;Trunk flexed Gait velocity: Decreased Gait velocity  interpretation: Below normal speed for age/gender General Gait Details: pt drifts to L side and at times runs into objects on L side.  pt needs verbal and tactile cueing for attending to R side and correcting L lateral lean.  pt with difficulty in conversing while ambulating and other multitasking tasks.     Stairs Stairs: Yes Stairs assistance: Min assist Stair Management: Two rails;Forwards Number of Stairs: 11 General stair comments: Pt was cued to hold bialteral rails. Pt only reached for L railing and when cued again to hold the R railing, pt states he did not see it there. Min assist for balance/safety on stairs.  Wheelchair Mobility    Modified Rankin (Stroke Patients Only) Modified Rankin (Stroke Patients Only) Pre-Morbid Rankin Score: No significant disability Modified Rankin: Moderately severe disability     Balance Overall balance assessment: Needs assistance Sitting-balance support: Feet supported;No upper extremity supported Sitting balance-Leahy Scale: Good     Standing balance support: No upper extremity supported;During functional activity Standing balance-Leahy Scale: Fair                      Cognition Arousal/Alertness: Awake/alert Behavior During Therapy: WFL for tasks assessed/performed Overall Cognitive Status: Impaired/Different from baseline Area of Impairment: Orientation;Memory;Safety/judgement;Awareness;Problem solving;Attention Orientation Level: Disoriented to;Situation;Time Current Attention Level: Selective Memory: Decreased short-term memory;Decreased recall of precautions   Safety/Judgement: Decreased awareness of safety;Decreased awareness of deficits Awareness: Emergent Problem Solving: Slow processing;Difficulty sequencing;Requires verbal cues;Requires tactile cues      Exercises      General Comments        Pertinent Vitals/Pain Pain Assessment: No/denies pain  Home Living                      Prior  Function            PT Goals (current goals can now be found in the care plan section) Acute Rehab PT Goals Patient Stated Goal: Back to normal PT Goal Formulation: With patient Time For Goal Achievement: 05/30/15 Potential to Achieve Goals: Good Progress towards PT goals: Progressing toward goals    Frequency  Min 4X/week    PT Plan Current plan remains appropriate    Co-evaluation             End of Session Equipment Utilized During Treatment: Gait belt Activity Tolerance: Patient tolerated treatment well Patient left: in chair;with call bell/phone within reach     Time: 7505-1833 PT Time Calculation (min) (ACUTE ONLY): 17 min  Charges:  $Gait Training: 8-22 mins                    G Codes:      Rolinda Roan Jun 12, 2015, 3:10 PM   Rolinda Roan, PT, DPT Acute Rehabilitation Services Pager: [phone]

## 2015-05-17 NOTE — Progress Notes (Signed)
STROKE TEAM PROGRESS NOTE   SUBJECTIVE (INTERVAL HISTORY) Wife is at bedside. GI study just rescheduled to tomorrow. Unable to consider CIR transfer until testing completed. No more bloody stool, tolerating plavix and ASA well.    OBJECTIVE Temp:  [98.3 F (36.8 C)-100.8 F (38.2 C)] 99.5 F (37.5 C) (10/12 1001) Pulse Rate:  [76-93] 79 (10/12 1001) Cardiac Rhythm:  [-] Normal sinus rhythm (10/12 0700) Resp:  [18-26] 18 (10/12 1001) BP: (103-130)/(42-69) 118/56 mmHg (10/12 1001) SpO2:  [94 %-100 %] 99 % (10/12 1001)  CBC:  Recent Labs Lab 05/14/15 1417  05/16/15 0330 05/16/15 1659 05/17/15 0654  WBC 12.0*  < > 13.3*  --  12.2*  NEUTROABS 8.3*  --   --   --   --   HGB 12.7*  < > 9.5* 9.1* 9.3*  HCT 38.3*  < > 29.2*  --  28.0*  MCV 93.6  < > 95.1  --  94.3  PLT 262  < > 222  --  244  < > = values in this interval not displayed.  Basic Metabolic Panel:  Recent Labs Lab 05/11/15 0630  05/16/15 0330 05/17/15 0654  NA 136  < > 138 139  K 3.9  < > 3.8 3.8  CL 100*  < > 109 103  CO2 24  < > 22 26  GLUCOSE 138*  < > 101* 107*  BUN 8  < > 9 7  CREATININE 1.00  < > 0.73 0.92  CALCIUM 8.7*  < > 8.2* 8.4*  MG 2.0  --   --   --   < > = values in this interval not displayed.  Lipid Panel:     Component Value Date/Time   CHOL 163 05/15/2015 0237   TRIG 144 05/15/2015 0237   HDL 23* 05/15/2015 0237   CHOLHDL 7.1 05/15/2015 0237   VLDL 29 05/15/2015 0237   LDLCALC 111* 05/15/2015 0237   HgbA1c:  Lab Results  Component Value Date   HGBA1C 6.2* 05/15/2015   Urine Drug Screen:     Component Value Date/Time   LABOPIA NONE DETECTED 05/14/2015 1904   COCAINSCRNUR NONE DETECTED 05/14/2015 1904   LABBENZ NONE DETECTED 05/14/2015 1904   AMPHETMU NONE DETECTED 05/14/2015 1904   THCU NONE DETECTED 05/14/2015 1904   LABBARB NONE DETECTED 05/14/2015 1904     IMAGING  Dg Chest 1 View 05/11/2015   IMPRESSION: Increased interstitial densities in both lungs. Findings are  suggestive for interstitial pulmonary edema.  Ct Head Wo Contrast 05/14/2015  IMPRESSION: No acute finding by CT. 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    Ct Angio head and Neck W/cm &/or Wo/cm 05/15/2015   IMPRESSION: 1. Acute left PCA branch infarct involving the occipital lobe. No hemorrhagic conversion post tPA. 2. Poor flow in the non dominant right vertebral artery, favor high-grade proximal stenosis over dissection. 3. Mild to moderate left P1 segment stenosis. No treatable flow limiting intracranial stenosis. 4. Extensive atherosclerosis, predominately extracranial. Proximal left ICA stenosis approaching 50%. 5. 3 mm left paraclinoid ICA aneurysm.      MRI 05/15/2015 Acute infarct left occipital lobe. Chronic microvascular ischemic changes in the white matter and left pons. Encephalomalacia right temporal and frontal lobe is chronic and unchanged. This may be due to in utero infarct.  2D Echo  05/13/2015 - Left ventricle: The cavity size was normal. Wall  thickness wasincreased in a pattern of mild LVH. Systolic function wasseverely reduced. The estimated ejection fraction was in therange of 20% to 25%. Diastolic function is abnormal, indeterminate grade. - Regional wall motion abnormality: Akinesis of the mid-apicalanterior, basal-mid anteroseptal, mid anterolateral, apicalseptal, apical lateral, and apical myocardium. - Aortic valve: Valve area (VTI): 2.76 cm^2. Valve area (Vmax):3.36 cm^2. - Systemic veins: IVC is small suggesting low RA pressures andhypovolemia.  2D ehco 05/15/15 - Left ventricle: The cavity size was normal. Wall thickness wasincreased in a pattern of mild LVH. Systolic function wasmoderately reduced. The estimated ejection fraction was in therange of 35% to 40%. There is moderate hypokinesis of themid-apicalanteroseptal myocardium.  There is moderate hypokinesisof the anterior and apical myocardium. Doppler parameters areconsistent with abnormal left ventricular relaxation (grade 1diastolic dysfunction). - Left atrium: The atrium was mildly dilated. - Right atrium: The atrium was mildly dilated.   PHYSICAL EXAM General - Well nourished, well developed, in no apparent distress.  Ophthalmologic - Fundi not visualized due to eye movement.  Cardiovascular - Regular rate and rhythm with no murmur.  Mental Status -  Level of arousal and orientation to place, and person were intact, not orientated to time. Language including expression, naming, repetition, comprehension was assessed and found intact. Fund of Knowledge was assessed and was impaired.  Cranial Nerves II - XII - II - right homonymous hemianopia. III, IV, VI - Extraocular movements intact. V - Facial sensation intact bilaterally. VII - Facial movement intact bilaterally. VIII - Hearing & vestibular intact bilaterally. X - Palate elevates symmetrically. XI - Chin turning & shoulder shrug intact bilaterally. XII - Tongue protrusion intact.  Motor Strength - The patient's strength was normal in all extremities and pronator drift was absent.  Bulk was normal and fasciculations were absent.   Motor Tone - Muscle tone was assessed at the neck and appendages and was normal.  Reflexes - The patient's reflexes were 1+ in all extremities and he had no pathological reflexes.  Sensory - Light touch, temperature/pinprick were assessed and were symmetrical.    Coordination - The patient had normal movements in the hands and feet with no ataxia or dysmetria.  Tremor was absent.  Gait and Station -  deferred due to active lower GI bleeding.   ASSESSMENT/PLAN Mr. Denney Shein is a 69 y.o. male with history of HTN, hyperlipidemia, prediabetes ,carotid artery disease, smoking, CAD s/p cath and stenting via right radial artery on 05/11/15, STEMI, ischemic  cardiomyopathy presenting with acute onset confusion and right hemianopia. He received IV t-PA 05/14/2015 at 3pm. He developed symptomatic LGIB post tPA. Was given 2 FFP. BP stable. GI and cardiology on board.  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 Diffuse atherosclerosis.   2D Echo  20-25% on 05/13/15 but 35-40% 05/15/15  LDL 186  HgbA1c 6.0  SCDs for VTE prophylaxis Diet Heart Room service appropriate?: Yes; Fluid consistency:: Thin Diet NPO time specified Except for: Sips with Meds  aspirin 81 mg orally every day and Brilinta 90 prior to admission, now on no antithrombotic as withint 24h of tPA and LGIB.   Ongoing aggressive stroke risk factor management  Therapy recommendations:  CIR   Disposition:  CIR once GI study completed  ([place] living in [place])  Symptomatic post tPA LGIB Anemia secondary to blood loss  Bloody stools with clots  during the night  Hgb 12.7->9.8->9.5->9.5 -> 8.7  S/p 2U FFP  On aspirin and Brilinta at admission, currently on hold due to [facility]  GI consult requested, considering bleeding from polypectomy.  Supportive care  CBC monitoring  On protonix.  Flex sig planned for 10/13  Recent NSTEMI w/ ischemic cardiomyopathy & stenting and NSVT Systolic dysfunction  Coronary artery disease - Oct 2016 NSTEMI s/p DES  Ischemic cardiomyopahty w/ EF 20-25%. Considering  Lifevest  EF improved with treatment to 35-40%  ASA and brilinta on hold due to tPA  Cardiology on board  started plavix after loading 24 hour post tPA  Will start baby ASA too for stroke and cardiac prevention.  Hypertension  Stable, and more on the low normal range Hold off BP meds due to LGIB   Hyperlipidemia  Home meds:  crestor 40, resumed in hospital  LDL 186, goal < 70  Continue statin at discharge  Tobacco  abuse  Current smoker  Smoking cessation counseling provided  Pt is willing to quit  Other Stroke Risk Factors  Advance age  No driving due to right hemianopia. Need follow up with OT and ophthal - pt and wife informed  Hospital day # 3  [name], MD PhD Stroke Neurology 05/17/2015 7:38 PM   To contact Stroke Continuity provider, please refer to [email or web address] After hours, contact General Neurology

## 2015-05-17 NOTE — Progress Notes (Signed)
Inpatient Rehabilitation   I note the flexible sigmoidoscopy is rescheduled till tomorrow am. I continue to await insurance decision for possible IP Rehab admission once medically cleared.  Please call if questions.  [place] Admissions Coordinator Cell [phone] Office [phone]

## 2015-05-17 NOTE — Progress Notes (Signed)
Damon Walker 10:35 AM  Subjective: Patient without any GI complaints and no signs of bleeding and his case and biopsy results was discussed with the patient and his wife  Objective: Vital signs stable afebrile no acute distress abdomen is soft nontender hemoglobin stable  Assessment: Neuroendocrine tumor and sigmoid  Plan: Unfortunately based on the schedule and other emergencies I have rescheduled his flexible sigmoidoscopy for tomorrow at 8 AM and will prep him with an enema or 2 at 6 AM but okay to eat today and continue aspirin and Plavix  [facility]  Pager [phone] After 5PM or if no answer call [phone]

## 2015-05-17 NOTE — Care Management Important Message (Signed)
Important Message  Patient Details  Name: Damon Walker MRN: [id number] Date of Birth: 1946-07-07   Medicare Important Message Given:  Yes-second notification given    Delorse Lek 05/17/2015, 10:35 AM

## 2015-05-18 ENCOUNTER — Encounter (HOSPITAL_COMMUNITY): Payer: Self-pay | Admitting: *Deleted

## 2015-05-18 ENCOUNTER — Inpatient Hospital Stay (HOSPITAL_COMMUNITY)
Admission: RE | Admit: 2015-05-18 | Discharge: 2015-05-26 | DRG: 056 | Disposition: A | Discharge status: Home Health | Payer: Medicare Other | Source: Intra-hospital | Attending: Physical Medicine & Rehabilitation | Admitting: Physical Medicine & Rehabilitation

## 2015-05-18 ENCOUNTER — Encounter (HOSPITAL_COMMUNITY)
Admission: EM | Disposition: A | Discharge status: Rehabilitation Facility | Payer: Self-pay | Source: Home / Self Care | Attending: Neurology

## 2015-05-18 DIAGNOSIS — I69312 Visuospatial deficit and spatial neglect following cerebral infarction: Secondary | ICD-10-CM | POA: Diagnosis not present

## 2015-05-18 DIAGNOSIS — R7303 Prediabetes: Secondary | ICD-10-CM | POA: Diagnosis not present

## 2015-05-18 DIAGNOSIS — K469 Unspecified abdominal hernia without obstruction or gangrene: Secondary | ICD-10-CM

## 2015-05-18 DIAGNOSIS — I213 ST elevation (STEMI) myocardial infarction of unspecified site: Secondary | ICD-10-CM | POA: Diagnosis not present

## 2015-05-18 DIAGNOSIS — K922 Gastrointestinal hemorrhage, unspecified: Secondary | ICD-10-CM | POA: Diagnosis not present

## 2015-05-18 DIAGNOSIS — R630 Anorexia: Secondary | ICD-10-CM | POA: Diagnosis not present

## 2015-05-18 DIAGNOSIS — R269 Unspecified abnormalities of gait and mobility: Secondary | ICD-10-CM | POA: Diagnosis not present

## 2015-05-18 DIAGNOSIS — H53461 Homonymous bilateral field defects, right side: Secondary | ICD-10-CM | POA: Diagnosis not present

## 2015-05-18 DIAGNOSIS — Z79899 Other long term (current) drug therapy: Secondary | ICD-10-CM

## 2015-05-18 DIAGNOSIS — Z7902 Long term (current) use of antithrombotics/antiplatelets: Secondary | ICD-10-CM

## 2015-05-18 DIAGNOSIS — Z955 Presence of coronary angioplasty implant and graft: Secondary | ICD-10-CM | POA: Diagnosis not present

## 2015-05-18 DIAGNOSIS — F5104 Psychophysiologic insomnia: Secondary | ICD-10-CM | POA: Diagnosis not present

## 2015-05-18 DIAGNOSIS — I255 Ischemic cardiomyopathy: Secondary | ICD-10-CM

## 2015-05-18 DIAGNOSIS — I10 Essential (primary) hypertension: Secondary | ICD-10-CM

## 2015-05-18 DIAGNOSIS — E785 Hyperlipidemia, unspecified: Secondary | ICD-10-CM

## 2015-05-18 DIAGNOSIS — Z7982 Long term (current) use of aspirin: Secondary | ICD-10-CM

## 2015-05-18 DIAGNOSIS — F17210 Nicotine dependence, cigarettes, uncomplicated: Secondary | ICD-10-CM

## 2015-05-18 DIAGNOSIS — I2510 Atherosclerotic heart disease of native coronary artery without angina pectoris: Secondary | ICD-10-CM | POA: Diagnosis not present

## 2015-05-18 DIAGNOSIS — E8809 Other disorders of plasma-protein metabolism, not elsewhere classified: Secondary | ICD-10-CM | POA: Diagnosis not present

## 2015-05-18 DIAGNOSIS — Z8719 Personal history of other diseases of the digestive system: Secondary | ICD-10-CM | POA: Diagnosis not present

## 2015-05-18 DIAGNOSIS — I63532 Cerebral infarction due to unspecified occlusion or stenosis of left posterior cerebral artery: Secondary | ICD-10-CM | POA: Diagnosis present

## 2015-05-18 DIAGNOSIS — R112 Nausea with vomiting, unspecified: Secondary | ICD-10-CM

## 2015-05-18 DIAGNOSIS — I69398 Other sequelae of cerebral infarction: Secondary | ICD-10-CM

## 2015-05-18 HISTORY — PX: FLEXIBLE SIGMOIDOSCOPY: SHX5431

## 2015-05-18 LAB — CBC
HCT: 27.1 % — ABNORMAL LOW (ref 39.0–52.0)
Hemoglobin: 9 g/dL — ABNORMAL LOW (ref 13.0–17.0)
MCH: 30.8 pg (ref 26.0–34.0)
MCHC: 33.2 g/dL (ref 30.0–36.0)
MCV: 92.8 fL (ref 78.0–100.0)
PLATELETS: 291 10*3/uL (ref 150–400)
RBC: 2.92 MIL/uL — AB (ref 4.22–5.81)
RDW: 13.1 % (ref 11.5–15.5)
WBC: 11.5 10*3/uL — ABNORMAL HIGH (ref 4.0–10.5)

## 2015-05-18 SURGERY — SIGMOIDOSCOPY, FLEXIBLE
Anesthesia: Moderate Sedation

## 2015-05-18 MED ORDER — ASPIRIN EC 81 MG PO TBEC
81.0000 mg | DELAYED_RELEASE_TABLET | Freq: Every day | ORAL | Status: DC
  Administered 2015-05-19 – 2015-05-26 (×8): 81 mg via ORAL
  Filled 2015-05-18 (×8): qty 1

## 2015-05-18 MED ORDER — MIDAZOLAM HCL 10 MG/2ML IJ SOLN
INTRAMUSCULAR | Status: DC | PRN
  Administered 2015-05-18 (×2): 2 mg via INTRAVENOUS

## 2015-05-18 MED ORDER — SPOT INK MARKER SYRINGE KIT
PACK | SUBMUCOSAL | Status: AC
  Filled 2015-05-18: qty 5

## 2015-05-18 MED ORDER — FLEET ENEMA 7-19 GM/118ML RE ENEM: 1.0000 | ENEMA | Freq: Once | RECTAL | Status: DC | PRN

## 2015-05-18 MED ORDER — PROCHLORPERAZINE EDISYLATE 5 MG/ML IJ SOLN: 5.0000 mg | Freq: Four times a day (QID) | INTRAMUSCULAR | Status: DC | PRN

## 2015-05-18 MED ORDER — FENTANYL CITRATE (PF) 100 MCG/2ML IJ SOLN
INTRAMUSCULAR | Status: DC | PRN
  Administered 2015-05-18 (×2): 25 ug via INTRAVENOUS

## 2015-05-18 MED ORDER — CLOPIDOGREL BISULFATE 75 MG PO TABS
75.0000 mg | ORAL_TABLET | Freq: Every day | ORAL | Status: DC
  Administered 2015-05-19 – 2015-05-26 (×8): 75 mg via ORAL
  Filled 2015-05-18 (×8): qty 1

## 2015-05-18 MED ORDER — BISACODYL 10 MG RE SUPP: 10.0000 mg | Freq: Every day | RECTAL | Status: DC | PRN

## 2015-05-18 MED ORDER — FENTANYL CITRATE (PF) 100 MCG/2ML IJ SOLN
INTRAMUSCULAR | Status: AC
Start: 2015-05-18 — End: 2015-05-18
  Filled 2015-05-18: qty 2

## 2015-05-18 MED ORDER — MIDAZOLAM HCL 5 MG/ML IJ SOLN
INTRAMUSCULAR | Status: AC
  Filled 2015-05-18: qty 2

## 2015-05-18 MED ORDER — ENOXAPARIN SODIUM 40 MG/0.4ML ~~LOC~~ SOLN
40.0000 mg | SUBCUTANEOUS | Status: DC
  Administered 2015-05-19 – 2015-05-26 (×8): 40 mg via SUBCUTANEOUS
  Filled 2015-05-18 (×8): qty 0.4

## 2015-05-18 MED ORDER — TRAZODONE HCL 50 MG PO TABS: 25.0000 mg | ORAL_TABLET | Freq: Every evening | ORAL | Status: DC | PRN

## 2015-05-18 MED ORDER — ALUM & MAG HYDROXIDE-SIMETH 200-200-20 MG/5ML PO SUSP: 30.0000 mL | ORAL | Status: DC | PRN

## 2015-05-18 MED ORDER — DIPHENHYDRAMINE HCL 50 MG/ML IJ SOLN
INTRAMUSCULAR | Status: AC
  Filled 2015-05-18: qty 1

## 2015-05-18 MED ORDER — ROSUVASTATIN CALCIUM 20 MG PO TABS
40.0000 mg | ORAL_TABLET | Freq: Every day | ORAL | Status: DC
  Administered 2015-05-19 – 2015-05-25 (×7): 40 mg via ORAL
  Filled 2015-05-18 (×8): qty 2

## 2015-05-18 MED ORDER — PROCHLORPERAZINE MALEATE 5 MG PO TABS
5.0000 mg | ORAL_TABLET | Freq: Four times a day (QID) | ORAL | Status: DC | PRN
  Administered 2015-05-23: 5 mg via ORAL
  Filled 2015-05-18: qty 1

## 2015-05-18 MED ORDER — SENNOSIDES-DOCUSATE SODIUM 8.6-50 MG PO TABS
1.0000 | ORAL_TABLET | Freq: Every evening | ORAL | Status: DC | PRN
  Administered 2015-05-21: 1 via ORAL
  Filled 2015-05-18: qty 1

## 2015-05-18 MED ORDER — PROCHLORPERAZINE 25 MG RE SUPP: 12.5000 mg | Freq: Four times a day (QID) | RECTAL | Status: DC | PRN

## 2015-05-18 MED ORDER — NITROGLYCERIN 0.4 MG SL SUBL: 0.4000 mg | SUBLINGUAL_TABLET | SUBLINGUAL | Status: DC | PRN

## 2015-05-18 MED ORDER — CARVEDILOL 3.125 MG PO TABS
3.1250 mg | ORAL_TABLET | Freq: Two times a day (BID) | ORAL | Status: DC
Start: 2015-05-18 — End: 2015-05-26
  Administered 2015-05-20 – 2015-05-26 (×6): 3.125 mg via ORAL
  Filled 2015-05-18 (×12): qty 1

## 2015-05-18 MED ORDER — ACETAMINOPHEN 325 MG PO TABS: 325.0000 mg | ORAL_TABLET | ORAL | Status: DC | PRN

## 2015-05-18 MED ORDER — SPOT INK MARKER SYRINGE KIT
PACK | SUBMUCOSAL | Status: DC | PRN
  Administered 2015-05-18: 2.5 mL via SUBMUCOSAL

## 2015-05-18 MED ORDER — GUAIFENESIN-DM 100-10 MG/5ML PO SYRP: 5.0000 mL | ORAL_SOLUTION | Freq: Four times a day (QID) | ORAL | Status: DC | PRN

## 2015-05-18 MED ORDER — DIPHENHYDRAMINE HCL 12.5 MG/5ML PO ELIX: 12.5000 mg | ORAL_SOLUTION | Freq: Four times a day (QID) | ORAL | Status: DC | PRN

## 2015-05-18 NOTE — PMR Pre-admission (Signed)
PMR Admission Coordinator Pre-Admission Assessment  Patient: Damon Walker is an 69 y.o., male MRN: [id number] DOB: [birth year] Height: 5\' 8"  (172.7 cm) Weight: 89 kg (196 lb 3.4 oz)              Insurance Information HMO: yes    PPO:      PCP:      IPA:      80/20:      OTHER:  PRIMARY:     BCBS Medicare  Policy#:  [id number]      Subscriber: self CM Name:  Cedrick      Phone#:  [phone]     Fax#: [phone] Pre-Cert#: auth received for IP Rehab admission on 10/13, Cedrick to provide auth # once pt. on the unit      Employer:  retired Benefits:  Phone #: [phone]     Name:  Marquette Saa. Date:  08/05/14     Deduct:  $0      Out of Pocket Max:  [phone]      Life Max:  none CIR:  $295 days 1-6/ 100% days 7+      SNF:  $0 days 1-20; $75 daily for days 21-49; $100 daily, days 50+ Outpatient: 100%     Co-Pay:  $40 Home Health:  100%      Co-Pay:  none DME:  80%     Co-Pay:  20% Providers:  In network SECONDARY:       Policy#:       Subscriber:  CM Name:       Phone#:      Fax#:  Pre-Cert#:       Employer:  Benefits:  Phone #:      Name:  Eff. Date:      Deduct:       Out of Pocket Max:       Life Max:  CIR:       SNF:  Outpatient:      Co-Pay:  Home Health:       Co-Pay:  DME:      Co-Pay:   Medicaid Application Date:       Case Manager:  Disability Application Date:       Case Worker:   Emergency Contact Information Contact Information    Name Relation Home Work Mobile   Bertucci,Valerie Spouse [phone]       Current Medical History  Patient Admitting Diagnosis: Left occipital CVA History of Present Illness: Damon Walker is a 69 y.o. RH-male with history of HTN, prediabetes, CAD with ICM s/p cath with stenting for STEMI on 05/11/15. He was discharged to home on 10/09 am and developed sudden onset of confusion with right visual field deficits after lunch. He was re-admitted for work up and CCT negative for acute changes. NIHSS 4 and he was treated with IV tPA  due to suspicion of L-PCA infarct. He developed LGIB with multiple episodes of rectal bleeding with clots post tPA. He had drop in H/H and was treated with FFP but continued to have bleeding episodes. Dr. Cristina Gong was consulted for input and recommended supportive care with serial monitoring of H/H with transfusion for next 1-2 days and bleeding scan for work up in case of medical instability.  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 Dr. Erlinda Hong felt that patient with cardioembolic L-PCA infarct due to low EF and recent MI. Repeat 2D echo with EF  35-40% with moderate hypokinesis of mid-apical anterior and lateral septal myocardium.   CTA head/neck with acute L-PCA infarct with mil to moderate P1 stenosis and extensive atherosclerosis Left Cardiology consulted and converted Brilinta to plavix to keep stents open. He was cleared for sigmoidscopy which was recommended by GI due to recent incompletely excised neuroendocrine colonic polyp. He underwent sigmoidoscopy today revealing small internal hemorrhoids and two prior biopsy sites without evidence of bleeding. Patient with resultant confusion, tendency to drift to the left and cognitive deficits--he continues to deny visual deficits.  Therapy ongoing and admitted to CIR on 05/18/15 Total: 1 NIH    Past Medical History  Past Medical History  Diagnosis Date  . Untreated Hypertension   . Tobacco abuse     a. 30 yrs - 1.5 ppd.  Marland Kitchen HLD (hyperlipidemia)   . Carotid arterial disease ([place])   . CAD (coronary artery disease)     Cath 05/11/2015 three-vessel CAD with 90% prox RCA, 80% mid RCA, 80% OM3, 100% prox LAD occlusion treated with DESx2. RCA and OM residual treated medically for now, however will need PCI if has recurrent CP  . Prediabetes     A1C 6.0 in Oct 2016  . Ischemic cardiomyopathy     EF 40% on cath 05/11/2015 after anterior  STEMI, EF 20-25% on echo 05/13/2015    Family History  family history includes Cancer in his mother; Other in his mother and another family member. He was adopted.  Prior Rehab/Hospitalizations:  Has the patient had major surgery during 100 days prior to admission? Yes; pt. Underwent recent cardiac stenting for STEMI  Current Medications   Current facility-administered medications:  .  0.9 %  sodium chloride infusion, , Intravenous, Continuous, [name], MD, Last Rate: 75 mL/hr at 05/18/15 1029 .  acetaminophen (TYLENOL) tablet 650 mg, 650 mg, Oral, Q4H PRN, 650 mg at 05/16/15 2233 **OR** [DISCONTINUED] acetaminophen (TYLENOL) suppository 650 mg, 650 mg, Rectal, Q4H PRN, [name], MD .  aspirin EC tablet 81 mg, 81 mg, Oral, Daily, [name], MD, 81 mg at 05/18/15 1028 .  clopidogrel (PLAVIX) tablet 75 mg, 75 mg, Oral, Daily, [name], MD, 75 mg at 05/18/15 1028 .  labetalol (NORMODYNE,TRANDATE) injection 10 mg, 10 mg, Intravenous, Q2H PRN, [name], MD .  pantoprazole (PROTONIX) EC tablet 40 mg, 40 mg, Oral, Daily, [name], MD, 40 mg at 05/18/15 0936 .  rosuvastatin (CRESTOR) tablet 40 mg, 40 mg, Oral, q1800, [name], MD, 40 mg at 05/17/15 1835 .  senna-docusate (Senokot-S) tablet 1 tablet, 1 tablet, Oral, QHS PRN, [name], MD  Patients Current Diet: Diet Heart Room service appropriate?: Yes; Fluid consistency:: Thin  Precautions / Restrictions Precautions Precautions: Fall, Other (comment) Precaution Comments: R visual deficits. Restrictions Weight Bearing Restrictions: No   Has the patient had 2 or more falls or a fall with injury in the past year?No  Prior Activity Level Community (5-7x/wk): Per wife, pt. out of the home about every other day for shopping and errands  Development worker, international aid / [place] Devices/Equipment: None Home Equipment: None  Prior Device Use: Indicate devices/aids used by the patient prior to current  illness, exacerbation or injury? None of the above; no device used PTA  Prior Functional Level Prior Function Level of Independence: Independent Comments: Pt. enjoys yard work and has been remodeling  the basement  Self Care: Did the patient need help bathing, dressing, using the toilet or eating?  Independent  Indoor Mobility: Did the patient need assistance with walking from room to room (with or without device)? Independent  Stairs: Did the patient need assistance with internal or external stairs (with or without device)? Independent  Functional Cognition: Did the patient need help planning regular tasks such as shopping or remembering to take medications? Independent  Current Functional Level Cognition  Arousal/Alertness: Awake/alert Overall Cognitive Status: Impaired/Different from baseline Current Attention Level: Selective Orientation Level: Oriented to person, Oriented to place, Disoriented to time, Oriented to situation Safety/Judgement: Decreased awareness of safety, Decreased awareness of deficits General Comments: Pt unable to recount situation. states he is "confused" Attention: Sustained Sustained Attention: Appears intact Memory: Impaired Memory Impairment: Storage deficit, Retrieval deficit Awareness: Impaired Awareness Impairment: Emergent impairment    Extremity Assessment (includes Sensation/Coordination)  Upper Extremity Assessment: Overall WFL for tasks assessed (will further assess)  Lower Extremity Assessment: RLE deficits/detail RLE Deficits / Details: Mild weakness grossly 4/5 and decreased coordination.   RLE Coordination: decreased fine motor    ADLs  Overall ADL's : Needs assistance/impaired Grooming: Wash/dry hands, Wash/dry face, Oral care, Brushing hair, Cueing for sequencing, Cueing for compensatory techniques, Minimal assistance, Cueing for safety, Standing (Frequent VC's for task initiation and due to visual deficits) Upper Body Bathing:  Minimal assitance Lower Body Bathing: Minimal assistance, Sit to/from stand, Cueing for safety Upper Body Dressing : Moderate assistance Lower Body Dressing: Moderate assistance Toilet Transfer: Minimal assistance, Ambulation, Cueing for safety (Cues to look to right as pt is unaware of visual deficits on right) Toileting- Clothing Manipulation and Hygiene: Min guard, Sit to/from stand, Cueing for safety Functional mobility during ADLs: Minimal assistance General ADL Comments: Pt is unaware of right visual field deficits and requires Min A during functional mobility and ADL's to assist with compensation techniques, pt requires consistent Min-mod vc's during treatment session today. Pt also with noted difficulty with following commands/task initiation.    Mobility  Overal bed mobility: Needs Assistance Bed Mobility: Supine to Sit Supine to sit: Min guard General bed mobility comments: Increased time, HOB elevated and use of rails. Pt was able to transition to EOB slowly with increased difficulty scooting out to get feet on floor. Close guard for safety.     Transfers  Overall transfer level: Needs assistance Equipment used: None Transfers: Sit to/from Stand Sit to Stand: Min assist General transfer comment: Steadying assist to power-up to full standing position. Demonstrates L lateral lean and increased time to achieve full upright posture prior to initiating gait.    Ambulation / Gait / Stairs / Wheelchair Mobility  Ambulation/Gait Ambulation/Gait assistance: Museum/gallery curator (Feet): 250 Feet Assistive device: None Gait Pattern/deviations: Step-through pattern, Decreased stride length, Trunk flexed General Gait Details: pt drifts to L side and at times runs into objects on L side.  pt needs verbal and tactile cueing for attending to R side and correcting L lateral lean.  pt with difficulty in conversing while ambulating and other multitasking tasks.   Gait velocity:  Decreased Gait velocity interpretation: Below normal speed for age/gender Stairs: Yes Stairs assistance: Min assist Stair Management: Two rails, Forwards Number of Stairs: 11 General stair comments: Pt was cued to hold bialteral rails. Pt only reached for L railing and when cued again to hold the R railing, pt states he did not see it there. Min assist for balance/safety on stairs.    Posture / Balance Balance Overall balance assessment: Needs assistance Sitting-balance support: No upper extremity supported, Feet supported Sitting balance-Leahy Scale: Good  Postural control: Left lateral lean Standing balance support: During functional activity, No upper extremity supported Standing balance-Leahy Scale: Fair    Special needs/care consideration BiPAP/CPAP  no CPM  no Continuous Drip IV  no Dialysis  no         Life Vest  no Oxygen  no Special Bed  no Trach Size  no Wound Vac (area)   no       Skin    intact                             Bowel mgmt:  Last BM 10/12 Bladder mgmt: continent Diabetic mgmt A1c 6.0     Previous Home Environment Living Arrangements: Spouse/significant other Available Help at Discharge: Other (Comment) (wife states she will arrange for 24/7 supervision) Type of Home: House Home Layout: Multi-level, 1/2 bath on main level Alternate Level Stairs-Rails: Right Alternate Level Stairs-Number of Steps: flight Home Access: Level entry [place]: No  Discharge Living Setting Plans for Discharge Living Setting: Patient's home Type of Home at Discharge: House Discharge Home Layout: Multi-level, Other (Comment) (tri level per wife) Alternate Level Stairs-Rails: Right Alternate Level Stairs-Number of Steps: 5 (5 steps from one level to the next) Discharge Home Access: Stairs to enter Entrance Stairs-Rails: Right, Left Entrance Stairs-Number of Steps: 5 Discharge Bathroom Shower/Tub: Walk-in shower Discharge Bathroom Toilet: Standard Discharge  Bathroom Accessibility: Yes How Accessible: Accessible via walker Does the patient have any problems obtaining your medications?:  (wife reports financial constraints with new meds)  Social/Family/Support Systems Patient Roles: Spouse Anticipated Caregiver: wife, Dereon Corkery [phone] Ability/Limitations of Caregiver: wife works at a [employer] from 6 am till 2:30 pm.  She indicates that she plans to take take off from work to be with her husband.  We discussed that pt. could have ongoing needs for 24/7 supervision Caregiver Availability: 24/7 Discharge Plan Discussed with Primary Caregiver: Yes Is Caregiver In Agreement with Plan?: Yes Does Caregiver/Family have Issues with Lodging/Transportation while Pt is in Rehab?: No   Goals/Additional Needs Patient/Family Goal for Rehab: modified independent and supervision with PT/OT; independent with SLP Expected length of stay: 7-10 days Cultural Considerations: none Dietary Needs: heart healthy, thin liquids Equipment Needs: TBA Additional Information: Pt. is from [facility] to Admission and willing to participate: Yes Program Orientation Provided & Reviewed with Pt/Caregiver Including Roles  & Responsibilities: Yes   Decrease burden of Care through IP rehab admission: no   Possible need for SNF placement upon discharge:   Not anticipated   Patient Condition: This patient's condition remains as documented in the consult dated 05/16/15 , in which the Rehabilitation Physician determined and documented that the patient's condition is appropriate for intensive rehabilitative care in an inpatient rehabilitation facility. Will admit to inpatient rehab today.  Preadmission Screen Completed By:  [name], 05/18/2015 12:29 PM ______________________________________________________________________   Discussed status with Dr.  Letta Pate on 05/18/15 at  1228  and received telephone approval for admission  today.  Admission Coordinator:  [name], time 1228 Sudie Grumbling 05/18/15

## 2015-05-18 NOTE — Progress Notes (Signed)
Inpatient Rehabilitation   I noet pt. has had his GI procedure.  I have received insurance approval and have medical clearance from Burnetta Sabin, NP  to admit today to IP Rehab.  I have updated pt's RN Manus Gunning, as well as Lorne Skeens, CM and Despina Pole, SW.  Pt. And wife pleased and agreeable.  I will make all necessary arrangements .  Please call if questions.  [facility] Admissions Coordinator Cell [phone] Office [phone]

## 2015-05-18 NOTE — Progress Notes (Signed)
Patient informed of discharge to CIR. Vitals stable. Patient and all belongings transported to CIR by wheelchair. Report given to RN of [facility].  Aldona Bar, RN

## 2015-05-18 NOTE — Discharge Summary (Addendum)
Stroke Discharge Summary  Patient ID: Damon Walker   MRN: [id number]      DOB: 30-Oct-1945  Date of Admission: 05/14/2015 Date of Discharge: 05/18/2015  Attending Physician:  [name], MD, Stroke MD  Consulting Physician(s):  Glenetta Hew, MD ([facility]), Delice Lesch, MD (rehab), Ronald Lobo, MD (GI)  Patient's PCP:  [name], MD  Discharge Diagnoses:  Principal Problem:   Stroke with cerebral ischemia ([place]) - left PCA occipital infarct, cardioembolic due to low EF and recent MI Active Problems:   Symptomatic post tPA LGIB   Anemia secondary to blood loss   Recent NSTEMI w/ ischemic cardiomyopathy & stenting and NSVT   Systolic and diastolic dysfunction   Hypertension   Hyperlipidemia   Tobacco abuse   ST elevation myocardial infarction (STEMI) of anterior wall, subsequent episode of care [facility])   Lower GI bleed   Coronary artery disease involving native coronary artery of native heart without angina pectoris   Presence of drug coated stent in LAD coronary artery   Past Medical History  Diagnosis Date  . Untreated Hypertension   . Tobacco abuse     a. 30 yrs - 1.5 ppd.  Marland Kitchen HLD (hyperlipidemia)   . Carotid arterial disease ([place])   . CAD (coronary artery disease)     Cath 05/11/2015 three-vessel CAD with 90% prox RCA, 80% mid RCA, 80% OM3, 100% prox LAD occlusion treated with DESx2. RCA and OM residual treated medically for now, however will need PCI if has recurrent CP  . Prediabetes     A1C 6.0 in Oct 2016  . Ischemic cardiomyopathy     EF 40% on cath 05/11/2015 after anterior STEMI, EF 20-25% on echo 05/13/2015   Past Surgical History  Procedure Laterality Date  . S/p appendectomy      Age 34  . S/p inguinal hernia repair      In his 20's.  . Cardiac catheterization N/A 05/11/2015    Procedure: Left Heart Cath and Coronary Angiography;  Surgeon: Peter M Martinique, MD;  Location: [facility];  Service: Cardiovascular;  Laterality: N/A;  .  Cardiac catheterization N/A 05/11/2015    Procedure: Coronary Stent Intervention;  Surgeon: Peter M Martinique, MD;  Location: [place] [facility];  Service: Cardiovascular;  Laterality: N/A;    Medications to be continued on Rehab . aspirin EC  81 mg Oral Daily  . clopidogrel  75 mg Oral Daily  . pantoprazole  40 mg Oral Daily  . rosuvastatin  40 mg Oral q1800    LABORATORY STUDIES CBC    Component Value Date/Time   WBC 11.5* 05/18/2015 0531   RBC 2.92* 05/18/2015 0531   HGB 9.0* 05/18/2015 0531   HCT 27.1* 05/18/2015 0531   PLT 291 05/18/2015 0531   MCV 92.8 05/18/2015 0531   MCH 30.8 05/18/2015 0531   MCHC 33.2 05/18/2015 0531   RDW 13.1 05/18/2015 0531   LYMPHSABS 2.2 05/14/2015 1417   MONOABS 1.2* 05/14/2015 1417   EOSABS 0.3 05/14/2015 1417   BASOSABS 0.1 05/14/2015 1417   CMP    Component Value Date/Time   NA 139 05/17/2015 0654   K 3.8 05/17/2015 0654   CL 103 05/17/2015 0654   CO2 26 05/17/2015 0654   GLUCOSE 107* 05/17/2015 0654   BUN 7 05/17/2015 0654   CREATININE 0.92 05/17/2015 0654   CALCIUM 8.4* 05/17/2015 0654   PROT 6.8 05/14/2015 1417   ALBUMIN 3.3* 05/14/2015 1417   AST  69* 05/14/2015 1417   ALT 37 05/14/2015 1417   ALKPHOS 43 05/14/2015 1417   BILITOT 1.1 05/14/2015 1417   GFRNONAA >60 05/17/2015 0654   GFRAA >60 05/17/2015 0654   COAGS Lab Results  Component Value Date   INR 1.11 05/14/2015   INR 1.03 05/11/2015   Lipid Panel    Component Value Date/Time   CHOL 163 05/15/2015 0237   TRIG 144 05/15/2015 0237   HDL 23* 05/15/2015 0237   CHOLHDL 7.1 05/15/2015 0237   VLDL 29 05/15/2015 0237   LDLCALC 111* 05/15/2015 0237   HgbA1C  Lab Results  Component Value Date   HGBA1C 6.2* 05/15/2015   Urinalysis    Component Value Date/Time   COLORURINE YELLOW 05/14/2015 1904   APPEARANCEUR CLEAR 05/14/2015 1904   LABSPEC 1.015 05/14/2015 1904   PHURINE 6.5 05/14/2015 1904   GLUCOSEU NEGATIVE 05/14/2015 1904   HGBUR TRACE* 05/14/2015 1904    BILIRUBINUR NEGATIVE 05/14/2015 1904   KETONESUR 15* 05/14/2015 1904   PROTEINUR NEGATIVE 05/14/2015 1904   UROBILINOGEN 2.0* 05/14/2015 1904   NITRITE NEGATIVE 05/14/2015 1904   LEUKOCYTESUR NEGATIVE 05/14/2015 1904   Urine Drug Screen     Component Value Date/Time   LABOPIA NONE DETECTED 05/14/2015 1904   COCAINSCRNUR NONE DETECTED 05/14/2015 1904   LABBENZ NONE DETECTED 05/14/2015 1904   AMPHETMU NONE DETECTED 05/14/2015 1904   THCU NONE DETECTED 05/14/2015 1904   LABBARB NONE DETECTED 05/14/2015 1904    Alcohol Level    Component Value Date/Time   ETH <5 05/14/2015 1417     SIGNIFICANT DIAGNOSTIC STUDIES Dg Chest 1 View 05/11/2015 IMPRESSION: Increased interstitial densities in both lungs. Findings are suggestive for interstitial pulmonary edema.  Ct Head Wo Contrast 05/14/2015 IMPRESSION: No acute finding by CT. 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   Ct Angio head and Neck W/cm &/or Wo/cm 05/15/2015 IMPRESSION: 1. Acute left PCA branch infarct involving the occipital lobe. No hemorrhagic conversion post tPA. 2. Poor flow in the non dominant right vertebral artery, favor high-grade proximal stenosis over dissection. 3. Mild to moderate left P1 segment stenosis. No treatable flow limiting intracranial stenosis. 4. Extensive atherosclerosis, predominately extracranial. Proximal left ICA stenosis approaching 50%. 5. 3 mm left paraclinoid ICA aneurysm.   MRI 05/15/2015 Acute infarct left occipital lobe. Chronic microvascular ischemic changes in the white matter and left pons. Encephalomalacia right temporal and frontal lobe is chronic and unchanged. This may be due to in utero infarct.  2D Echo  05/13/2015 - Left ventricle: The cavity size was normal. Wall thickness wasincreased in a pattern of mild LVH. Systolic function wasseverely  reduced. The estimated ejection fraction was in therange of 20% to 25%. Diastolic function is abnormal, indeterminate grade. - Regional wall motion abnormality: Akinesis of the mid-apicalanterior, basal-mid anteroseptal, mid anterolateral, apicalseptal, apical lateral, and apical myocardium. - Aortic valve: Valve area (VTI): 2.76 cm^2. Valve area (Vmax):3.36 cm^2. - Systemic veins: IVC is small suggesting low RA pressures andhypovolemia.  05/15/15 - Left ventricle: The cavity size was normal. Wall thickness wasincreased in a pattern of mild LVH. Systolic function wasmoderately reduced. The estimated ejection fraction was in therange of 35% to 40%. There is moderate hypokinesis of themid-apicalanteroseptal myocardium. There is moderate hypokinesisof the anterior and apical myocardium. Doppler parameters areconsistent with abnormal left ventricular relaxation (grade 1diastolic dysfunction). - Left atrium: The atrium was mildly dilated. - Right atrium: The  atrium was mildly dilated.  Flex Sig 1. Internal hemorrhoids small  2. Distal sigmoid polypectomy site with flat Whitebase status post biopsy and tattoo with 76mL of spot over 3 injections 3. Proximal sigmoid polypectomy site seen as well 4. Otherwise within normal limits to mid descending without signs of bleeding from above     Damon Walker is an 69 y.o. male with a past medical history significant for HTN, hyperlipidemia, prediabetes, carotid artery disease, smoking, CAD s/p cath and stenting via right radial artery on 05/11/15,STEMI, ischemic cardiomyopathy, presents to the ED with his wife for evaluation of acute onset confusion and visual impairment. Never had similar symptoms before. Patient was discharged from the hospital today (05/14/2015) following a severe ST segment elevation MI on 05/11/2015, during which time he underwent placement of 2 drug-eluting stents into the LAD, but he was noted to  have severe three-vessel disease. His most recent ejection fraction is on the order of 20 or 25%. At home, finished having lunch with his wife, when she noted that he was all of the sudden confused and few minutes later he began complaining of decreased vision in the right visual field. Denies associated HA, vertigo, double vision, focal weakness or numbness,slurred speech, language impairment of unsteadiness. Patient on aspirin and Brilinta. NIHSS 4. CT brain showed no acute abnormality. He was LSN 05/14/15 at 12 pm. Patient was administered IV TPA and was admitted to the neuro ICU for further evaluation and treatment.  Post tPA, patient developed acute LGIB with Hgb drop. FFP x 2 administered. GI consulted. On Sept 29, pt had multiple hot snare polypectomies from both proximal and distal colon.    HOSPITAL COURSE  Damon Walker is a 69 y.o. male with history of HTN, hyperlipidemia, prediabetes, carotid artery disease, smoking, hot snare polpectomy 05/04/2015, CAD s/p cath and stenting via right radial artery on 05/11/15, STEMI, ischemic cardiomyopathy presenting with acute onset confusion and right hemianopia. He received IV t-PA 05/14/2015 at 3pm. Post tPA, he developed symptomatic LGIB post tPA. Was given 2 FFP.   Stroke: left PCA occipital infarct, cardioembolic due to low EF and recent MI  Resultant Right hemianopia  MRI L occipital infarct  CTA head and neck left P2/P3 occlusion. Diffuse atherosclerosis.   2D Echo 20-25% on 05/13/15 but 35-40% 05/15/15  LDL 186  HgbA1c 6.0  aspirin 81 mg orally every day and Brilinta 90 prior to admission, changed to aspirin 81 mg and plavix 75 mg daily.   Ongoing aggressive stroke risk factor management  Therapy recommendations: CIR   Disposition: CIR ([place] living in [place])  Symptomatic post tPA LGIB Anemia secondary to blood loss  Bloody stools with clots post tPA  Hgb 12.7-> 8.7->11.5 day of discharge  S/p 2U  FFP  On aspirin and Brilinta at admission  GI consult requested, considering bleeding from polypectomy. Flex sig without acute bleeding. bx done. Repeat in 2-3 years if bx neg.   On protonix.  Restarted antithrombotics, on aspirin and plavix  Recent NSTEMI w/ ischemic cardiomyopathy & stenting and NSVT Systolic and diastolic dysfunction  Coronary artery disease - Oct 2016 NSTEMI s/p DES  Ischemic cardiomyopahty w/ EF 20-25%. ConsideringLifevest  EF improved with treatment to 35-40%  Cardiology consulted  started baby ASA and plavix for stroke and cardiac prevention.  Hypertension  Stable, and more on the low normal range  Monitor BP and resume home meds as continues to stabilize   Hyperlipidemia  Home meds: crestor  35, resumed in hospital  LDL 186, goal < 70  Continue statin at discharge  Tobacco abuse  Current smoker  Smoking cessation counseling provided  Pt is willing to quit  Other Stroke Risk Factors  Advance age   DISCHARGE EXAM Blood pressure 117/54, pulse 80, temperature 99.5 F (37.5 C), temperature source Oral, resp. rate 16, height 5\' 8"  (1.727 m), weight 89 kg (196 lb 3.4 oz), SpO2 99 %. General - Well nourished, well developed, in no apparent distress.  Ophthalmologic - Fundi not visualized due to eye movement.  Cardiovascular - Regular rate and rhythm with no murmur.  Mental Status -  Level of arousal and orientation to place, and person were intact, not orientated to time. Language including expression, naming, repetition, comprehension was assessed and found intact. Fund of Knowledge was assessed and was impaired.  Cranial Nerves II - XII - II - right homonymous hemianopia. III, IV, VI - Extraocular movements intact. V - Facial sensation intact bilaterally. VII - Facial movement intact bilaterally. VIII - Hearing & vestibular intact bilaterally. X - Palate elevates symmetrically. XI - Chin turning & shoulder shrug intact  bilaterally. XII - Tongue protrusion intact.  Motor Strength - The patient's strength was normal in all extremities and pronator drift was absent. Bulk was normal and fasciculations were absent.  Motor Tone - Muscle tone was assessed at the neck and appendages and was normal.  Reflexes - The patient's reflexes were 1+ in all extremities and he had no pathological reflexes.  Sensory - Light touch, temperature/pinprick were assessed and were symmetrical.   Coordination - The patient had normal movements in the hands and feet with no ataxia or dysmetria. Tremor was absent.  Gait and Station - deferred due to safety concerns.   Discharge Diet  Diet Heart Room service appropriate?: Yes; Fluid consistency:: Thin liquids  DISCHARGE PLAN  Disposition:  Transfer to [place] for ongoing PT, OT and ST  No driving due to right hemianopia.   aspirin 81 mg orally every day and clopidogrel 75 mg orally every day for secondary stroke prevention.  Recommend ongoing risk factor control by Primary Care Physician at time of discharge from inpatient rehabilitation.  Follow-up MORROW, AARON, MD in 2 weeks following discharge from rehab.  Follow-up with Dr. [name], Stroke Clinic in 2 months.   45 minutes were spent preparing discharge.  [place] for Pager information 05/18/2015 1:06 PM   I, the attending vascular neurologist, have personally obtained a history, examined the patient, evaluated laboratory data, individually viewed imaging studies and agree with radiology interpretations. Together with the NP/PA, we formulated the assessment and plan of care which reflects our mutual decision.  I have made any additions or clarifications directly to the above note and agree with the findings and plan as currently documented.    [name], MD PhD Stroke Neurology 05/18/2015 5:42 PM

## 2015-05-18 NOTE — Progress Notes (Signed)
Speech Language Pathology Treatment: Cognitive-Linquistic  Patient Details Name: Damon Walker MRN: [id number] DOB: Jun 13, 1946 Today's Date: 05/18/2015 Time: 1040-1100 SLP Time Calculation (min) (ACUTE ONLY): 20 min  Assessment / Plan / Recommendation Clinical Impression  Pt was seen for skilled ST targeting cognitive-linguistic goals.  Upon arrival, pt was seated upright in bed, awake, alert, and agreeable to participate in [place].  When attempting to write at the word and phrase level, pt was noted with impaired letter formation and only wrote on the left half of the Damon Walker.  With skilled interventions for use of a visual anchor and a written model to copy, pt's letter formation significantly improved and he was able to write words, letters, and sentences that spanned completely across the Damon Walker from left to right with min assist verbal cues.  Pt's legibility also improved with skilled instruction to write in uppercase, printed letters versus alternating between upper and lower case letters in script. SLP provided skilled education regarding right visual field deficits and their impact on pt's ability to complete familiar self care and/or home management tasks safely and independently.  Pt verbalized understanding of education and demonstrated emerging carryover of compensatory right attention strategies during written tasks.  Of note, pt is scheduled to be discharged to CIR this afternoon where it is recommended that he receive follow up ST services.      HPI Other Pertinent Information: Damon Walker is an 69 y.o. male with a PMH significant for HTN, hyperlipidemia, prediabetes, carotid artery disease, smoking, CAD s/p cath and stenting via right radial artery on 05/11/15, STEMI, ischemic cardiomyopathy, presented to the ED with his wife for evaluation of acute onset confusion and visual impairment. TPA was administered. CT brain showed no acute abnormality. MRI Head showed  Acute infarct left  occipital lobe, chronic microvascular ischemic changes in the white matter and left pons. encephalomalacia right temporal and frontal lobe is chronic and unchanged. This may be due to in utero infarct.   Pertinent Vitals Pain Assessment: No/denies pain  SLP Plan  Continue with current plan of care    Recommendations                Follow up Recommendations: Inpatient Rehab Plan: Continue with current plan of care    GO     Damon Walker, Selinda Orion 05/18/2015, 11:09 AM

## 2015-05-18 NOTE — Progress Notes (Signed)
PT Cancellation Note  Patient Details Name: Damon Walker MRN: [id number] DOB: 12-20-1945   Cancelled Treatment:    Reason Eval/Treat Not Completed: Per chart review, pt will be transferring to CIR today. Will defer further skilled PT to inpatient rehab.    Rolinda Roan 05/18/2015, 1:07 PM   Rolinda Roan, PT, DPT Acute Rehabilitation Services Pager: [phone]

## 2015-05-18 NOTE — Progress Notes (Signed)
Patient Name: Damon Walker Date of Encounter: 05/18/2015  Primary Cardiologist Dr Ellyn Hack Patient Profile: 69 y/o male, with recent multiple polypectomies by GI less than 2 weeks ago as well as recent admission 10/6-10/9 with anterior STEMI, s/p DES x 2 to the LAD. 3.0 x 38 mm Promus stent distally and 3.0 x 16 mm Promus stent proximally in an overlapping fashion. Residual disease of 90% in the RCA and 80% in the OM3 to be initially treated medically. EF 35-45% at cath, 20-25% by echo.  2 hrs after discharge, he developed confusion and blurred vision. CT of head negative in the ED. Seen by neurology and left PCA distribution infarct suspected. TPA given.   Post tPA, patient developed acute LGIB with Hgb drop from 13-->9.   SUBJECTIVE  Sigmoidoscopy done today. Feeling well. No chest pain, sob or palpitations.   CURRENT MEDS . aspirin EC  81 mg Oral Daily  . clopidogrel  75 mg Oral Daily  . pantoprazole  40 mg Oral Daily  . rosuvastatin  40 mg Oral q1800    OBJECTIVE  Filed Vitals:   05/18/15 0840 05/18/15 0845 05/18/15 0850 05/18/15 0952  BP: 112/52 116/48 117/49 117/54  Pulse: 86 80 79 80  Temp:    99.5 F (37.5 C)  TempSrc:    Oral  Resp: 21 26 20 16   Height:      Weight:      SpO2: 96% 96% 96% 99%    Intake/Output Summary (Last 24 hours) at 05/18/15 1126 Last data filed at 05/18/15 0600  Gross per 24 hour  Intake      0 ml  Output    400 ml  Net   -400 ml   Filed Weights   05/14/15 1405 05/14/15 1545  Weight: 192 lb 14 oz (87.488 kg) 196 lb 3.4 oz (89 kg)    PHYSICAL EXAM  General: Pleasant, NAD. Neuro: Alert and oriented X 3. Moves all extremities spontaneously. Psych: Normal affect. HEENT:  Normal  Neck: Supple without bruits or JVD. Lungs:  Resp regular and unlabored, CTA. Heart: RRR no s3, s4, or murmurs. Abdomen: Soft, non-tender, non-distended, BS + x 4.  Extremities: No clubbing, cyanosis or edema. DP/PT/Radials 2+ and equal  bilaterally.  Accessory Clinical Findings  CBC  Recent Labs  05/17/15 0654 05/17/15 1616 05/18/15 0531  WBC 12.2*  --  11.5*  HGB 9.3* 8.7* 9.0*  HCT 28.0*  --  27.1*  MCV 94.3  --  92.8  PLT 244  --  998   Basic Metabolic Panel  Recent Labs  05/16/15 0330 05/17/15 0654  NA 138 139  K 3.8 3.8  CL 109 103  CO2 22 26  GLUCOSE 101* 107*  BUN 9 7  CREATININE 0.73 0.92  CALCIUM 8.2* 8.4*    TELE  NSR with few PVCs    ASSESSMENT AND PLAN  1. CAD: s/p recent STEMI treated with DES x 2 to the LAD. 3.0 x 38 mm Promus stent distally and 3.0 x 16 mm Promus stent proximally in an overlapping fashion. Also with residual disease of 90% in the RCA and 80% in the OM3, treated medically. -  Continue ASA, Plavix and statin.  - BP improved, last reading 117/54 . Consider adding BB and then ACE as BP allows.   2. CVA: s/p tPA.  - left PCA occipital infarct, cardioembolic  - Neurological deficits resolved.  - plan for CIR  3. LGIB: followed by Dr. Cristina Gong.  - flexible  Sigmoidoscopy done today - 1. Internal hemorrhoids small 2. Distal sigmoid polypectomy site with flat Whitebase status post biopsy and tattoo with 63mL of spot over 3 injections3. Proximal sigmoid polypectomy site seen as well 4. Otherwise within normal limits to mid descending without signs of bleeding from above. Pending biopsy result.   4. Anemia: post hemorrhagic anemia, moderate. Hgb stable overnight at 9.0. ASA and plavix restarted. Continue to monitor closely.   5. Systolic and diastolic Dysfunction: EF 67-61% at cath,  - last echo 05/15/15 with LV ef of 35-40%, grade 1 DD, mild dilated LA and RA.  Volume is stable. NSR on telemetry.  -  No CHF Sx. - BB/ACE as above and per MD.   Jarrett Soho PA-C Pager [phone]   I have seen, examined and evaluated the patient this PM along with Mr. [name].  After reviewing all the available data and chart,  I agree with his findings, examination as  well as impression recommendations.  He is actually doing quite well from a cardiac standpoint. Had a relook flexible sigmoidoscopy earlier today. Unfortunately his blood pressures actually been somewhat lower today probably related to his procedure. Provided his blood pressures become more stable in the 110-120 range, I would like to start him back on low-dose beta blocker - he was on carvedilol 9.375 mg twice a day as an outpatient. I would like to at least try 3.125 twice a day for discharge to rehabilitation.   While in rehabilitation, with the prudent to initiate cardiac rehabilitation orientation for him to follow-up with cardiac rehabilitation after being discharged from inpatient rehabilitation. He does have existing coronary disease and is on a statin as well as Plavix. We'll need to assess for recurrence of anginal symptoms while in rehabilitation. We can address timing and whether or not we need to intervene on the right coronary artery plus or minus the OM once he is discharged from rehabilitation.  I spent close to an hour with the patient and his wife and daughter (daughter visiting from [place]). We talked about his pre-morbid health prior to him presenting with his STEMI followed by stroke. Currently he had been having some symptoms that would've been consistent with worsening angina prior to his MI that had not been evaluated. We talked about his Myoview results and discussed his coronary findings on catheterization. We discussed potential plans for intervention on his additional lesions. We also talked about his reduced ejection fraction and concerns about heart failure as well as hope that his EF would improve.   Total time with patient and chart greater than 1 hour    Herchel Hopkin, Leonie Green, M.D., M.S. Interventional Cardiologist   Pager # [phone]

## 2015-05-18 NOTE — Progress Notes (Signed)
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

## 2015-05-18 NOTE — H&P (Signed)
Physical Medicine and Rehabilitation Admission H&P     Chief Complaint   Patient presents with   .  Blurred Vision   : HPI:   Damon Walker is a 69 y.o. RH-male with history of HTN, prediabetes, CAD with ICM s/p cath with stenting for STEMI on 05/11/15. He was discharged to home on 10/09 am and developed sudden onset of confusion with right visual field deficits after lunch.  He was re-admitted for work up and CCT negative for acute changes.  NIHSS 4 and he was treated with IV tPA due to suspicion of L-PCA infarct.  He developed LGIB with multiple episodes of  rectal bleeding with clots post tPA. He had drop in H/H and was treated with FFP but continued to have bleeding episodes.  Dr. Cristina Gong was consulted for input and recommended supportive care with serial monitoring of H/H with transfusion for next 1-2 days and bleeding scan for work up in case of medical instability.   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  Dr. Erlinda Hong felt that patient with cardioembolic  L-PCA infarct due to low EF and recent MI.  Repeat 2D echo with EF 35-40% with moderate hypokinesis of mid-apical anterior and lateral septal myocardium.   CTA head/neck with acute L-PCA infarct with mil to moderate P1 stenosis and extensive atherosclerosis   Left Cardiology consulted and converted Brilinta to plavix to keep stents open. He was cleared for sigmoidscopy which was recommended by GI due to recent incompletely excised neuroendocrine colonic polyp.  He underwent sigmoidoscopy today revealing small internal hemorrhoids and two prior biopsy sites without evidence of bleeding.  Patient with resultant confusion, tendency to drift to the left and cognitive deficits--he continues to deny visual deficits. Therapy ongoing and CIR recommended for follow up therapy.    Patient notes decreased vision when looking toward the  right   Review of Systems  Constitutional: Negative for malaise/fatigue.  HENT: Negative for hearing loss and tinnitus.   Eyes: Negative for blurred vision and double vision.  Respiratory: Negative for cough and shortness of breath.   Cardiovascular: Negative for chest pain, palpitations and leg swelling.  Gastrointestinal: Negative for heartburn, nausea, abdominal pain and constipation.  Genitourinary: Negative for urgency.  Musculoskeletal: Negative for joint pain (wife reported occasional hip pain).  Skin: Negative for itching and rash.  Neurological: Positive for focal weakness. Negative for dizziness, tingling, sensory change, speech change, weakness and headaches.  Psychiatric/Behavioral: Negative for depression. The patient is not nervous/anxious.       Past Medical History   Diagnosis  Date   .  Untreated Hypertension     .  Tobacco abuse         a. 30 yrs - 1.5 ppd.   Marland Kitchen  HLD (hyperlipidemia)     .  Carotid arterial disease ([place])     .  CAD (coronary artery disease)         Cath 05/11/2015 three-vessel CAD with 90% prox RCA, 80% mid RCA, 80% OM3, 100% prox LAD occlusion treated with DESx2. RCA and OM residual treated medically for now, however will need PCI if has recurrent CP   .  Prediabetes         A1C 6.0 in Oct 2016   .  Ischemic cardiomyopathy         EF 40% on cath 05/11/2015 after anterior STEMI, EF 20-25% on echo 05/13/2015  Past Surgical History   Procedure  Laterality  Date   .  S/p appendectomy           Age 39   .  S/p inguinal hernia repair           In his 20's.   .  Cardiac catheterization  N/A  05/11/2015       Procedure: Left Heart Cath and Coronary Angiography;  Surgeon: Peter M Martinique, MD;  Location: [facility];  Service: Cardiovascular;  Laterality: N/A;   .  Cardiac catheterization  N/A  05/11/2015       Procedure: Coronary Stent Intervention;  Surgeon: Peter M Martinique, MD;  Location: [facility];  Service: Cardiovascular;   Laterality: N/A;       Family History   Problem  Relation  Age of Onset   .  Adopted: Yes   .  Other           Adopted - unaware of biological parent's histories.   .  Other  Mother         eczema    .  Cancer  Mother       Social History:  Cira Rue works days, but she states she is able to be at home for 24/7 support. 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  He reports that he has been smoking Cigarettes--1.5 PPD.  He has a 30 pack-year smoking history. He does not have any smokeless tobacco history on file. He reports that he does not drink alcohol or use illicit drugs.     Allergies   Allergen  Reactions   .  Latex  Itching and Rash       Medications Prior to Admission   Medication  Sig  Dispense  Refill   .  aspirin 81 MG chewable tablet  Chew 1 tablet (81 mg total) by mouth daily.       .  carvedilol (COREG) 3.125 MG tablet  Take 3 tablets (9.375 mg total) by mouth 2 (two) times daily with a meal.  180 tablet  5   .  furosemide (LASIX) 20 MG tablet  Take 1 tablet (20 mg total) by mouth as needed for fluid or edema (for edema and shortness of breath).  30 tablet  3   .  lisinopril (PRINIVIL,ZESTRIL) 5 MG tablet  Take 1 tablet (5 mg total) by mouth daily.  30 tablet  11   .  nitroGLYCERIN (NITROSTAT) 0.4 MG SL tablet  Place 1 tablet (0.4 mg total) under the tongue every 5 (five) minutes x 3 doses as needed for chest pain.  25 tablet  3   .  pantoprazole (PROTONIX) 40 MG tablet  Take 40 mg by mouth 2 (two) times daily.       .  rosuvastatin (CRESTOR) 40 MG tablet  Take 1 tablet (40 mg total) by mouth daily at 6 PM.  90 tablet  3   .  ticagrelor (BRILINTA) 90 MG TABS tablet  Take 1 tablet (90 mg total) by mouth 2 (two) times daily.  60 tablet  11     Home: Home Living Family/patient expects to be discharged to:: Private residence Living Arrangements: Spouse/significant other Available Help at Discharge: Family, Available PRN/intermittently (Wife works) Type of Home: House Home  Access: Level entry Home Layout: Multi-level, 1/2 bath on main level Alternate Level Stairs-Number of Steps: flight Alternate Level Stairs-Rails: Right Home Equipment: None    Functional History: Prior Function Level of [place]:  Independent  Functional Status:  Mobility: Bed Mobility Overal bed mobility: Needs Assistance Bed Mobility: Supine to Sit Supine to sit: Supervision General bed mobility comments: When pt is given increased time pt is able to struggle to sit at EOB without A, but seems difficult for pt.   Transfers Overall transfer level: Needs assistance Equipment used: None Transfers: Sit to/from Stand Sit to Stand: Min assist General transfer comment: pt with L lateral lean with coming to stand and needs cueing to correct posture.   Ambulation/Gait Ambulation/Gait assistance: Min assist Ambulation Distance (Feet): 75 Feet Assistive device: None Gait Pattern/deviations: Step-through pattern, Decreased stride length, Narrow base of support, Drifts right/left General Gait Details: pt drifts to L side and at times runs into objects on L side.  pt needs verbal and tactile cueing for attending to R side and correcting L lateral lean.  pt with difficulty in conversing while ambulating and other multitasking tasks.      ADL: ADL Overall ADL's : Needs assistance/impaired Grooming: Set up, Supervision/safety Upper Body Bathing: Minimal assitance Lower Body Bathing: Minimal assistance Upper Body Dressing : Moderate assistance Lower Body Dressing: Moderate assistance Toilet Transfer: Minimal assistance Functional mobility during ADLs: Minimal assistance General ADL Comments: Not seeing food on R half of his tray.  Cognition: Cognition Overall Cognitive Status: Impaired/Different from baseline Arousal/Alertness: Awake/alert Orientation Level: Oriented to person, Oriented to place, Disoriented to time Attention: Sustained Sustained Attention: Appears intact Memory:  Impaired Memory Impairment: Storage deficit, Retrieval deficit Awareness: Impaired Awareness Impairment: Emergent impairment Cognition Arousal/Alertness: Awake/alert Behavior During Therapy: WFL for tasks assessed/performed Overall Cognitive Status: Impaired/Different from baseline Area of Impairment: Orientation, Memory, Safety/judgement, Awareness, Problem solving, Attention Orientation Level: Disoriented to, Situation, Time Current Attention Level: Selective Memory: Decreased short-term memory, Decreased recall of precautions Safety/Judgement: Decreased awareness of safety, Decreased awareness of deficits Awareness: Emergent Problem Solving: Slow processing, Difficulty sequencing, Requires verbal cues, Requires tactile cues General Comments: Pt unable to recount situation. states he is "confused"    Blood pressure 121/64, pulse 76, temperature 98.3 F (36.8 C), temperature source Oral, resp. rate 20, height _0  (1.727 m), weight 89 kg (196 lb 3.4 oz), SpO2 98 %. Physical Exam  Nursing note and vitals reviewed. Constitutional: He is oriented to person, place, and time. He appears well-developed and well-nourished.  HENT:   Head: Normocephalic and atraumatic.  Eyes: Conjunctivae and EOM are normal. Pupils are equal, round, and reactive to light.  Neck: Normal range of motion. Neck supple.  Cardiovascular: Normal rate and regular rhythm.    No murmur heard. Respiratory: Effort normal and breath sounds normal. No respiratory distress. He has no wheezes.  GI: Soft. Bowel sounds are normal. He exhibits no distension. There is no tenderness.  Musculoskeletal: He exhibits no edema or tenderness.  Neurological: He is alert and oriented to person, place, and time.  Speech clear. He is able to state moth with cues. He is  to follow one and two step commands without difficulty.  Finger to nose intact.   Unable to locate items in rooms without cues to compensate.   Skin: Skin is warm and  dry. No rash noted.  Dense right homonymous hemianopsia Motor strength is 5/5 bilateral deltoid, biceps, triceps, grip 4/5 bilateral hip flexors knee extensors ankle dorsiflexor and plantar flexor Sensation intact to light touch.     Lab Results Last 48 Hours    Results for orders placed or performed during the hospital encounter of 05/14/15 (from the past 48 hour(s))  Glucose, capillary     Status: Abnormal     Collection Time: 05/15/15 11:24 AM   Result  Value  Ref Range     Glucose-Capillary  154 (H)  65 - 99 mg/dL   Glucose, capillary     Status: None     Collection Time: 05/15/15  4:05 PM   Result  Value  Ref Range     Glucose-Capillary  78  65 - 99 mg/dL   Hemoglobin     Status: Abnormal     Collection Time: 05/15/15  5:16 PM   Result  Value  Ref Range     Hemoglobin  9.5 (L)  13.0 - 17.0 g/dL   Glucose, capillary     Status: Abnormal     Collection Time: 05/15/15  8:09 PM   Result  Value  Ref Range     Glucose-Capillary  128 (H)  65 - 99 mg/dL   Basic metabolic panel     Status: Abnormal     Collection Time: 05/16/15  3:30 AM   Result  Value  Ref Range     Sodium  138  135 - 145 mmol/L     Potassium  3.8  3.5 - 5.1 mmol/L     Chloride  109  101 - 111 mmol/L     CO2  22  22 - 32 mmol/L     Glucose, Bld  101 (H)  65 - 99 mg/dL     BUN  9  6 - 20 mg/dL     Creatinine, Ser  0.73  0.61 - 1.24 mg/dL     Calcium  8.2 (L)  8.9 - 10.3 mg/dL     GFR calc non Af Amer  >60  >60 mL/min     GFR calc Af Amer  >60  >60 mL/min       Comment:  (NOTE)  The eGFR has been calculated using the CKD EPI equation. This calculation has not been validated in all clinical situations. eGFR's persistently <60 mL/min signify possible Chronic Kidney Disease.      Anion gap  7  5 - 15   CBC     Status: Abnormal     Collection Time: 05/16/15  3:30 AM   Result  Value  Ref Range     WBC  13.3 (H)  4.0 - 10.5 K/uL     RBC  3.07 (L)  4.22 - 5.81 MIL/uL     Hemoglobin  9.5 (L)  13.0 - 17.0 g/dL      HCT  29.2 (L)  39.0 - 52.0 %     MCV  95.1  78.0 - 100.0 fL     MCH  30.9  26.0 - 34.0 pg     MCHC  32.5  30.0 - 36.0 g/dL     RDW  13.2  11.5 - 15.5 %     Platelets  222  150 - 400 K/uL   Glucose, capillary     Status: None     Collection Time: 05/16/15  7:31 AM   Result  Value  Ref Range     Glucose-Capillary  98  65 - 99 mg/dL   Glucose, capillary     Status: Abnormal     Collection Time: 05/16/15 11:20 AM   Result  Value  Ref Range     Glucose-Capillary  127 (H)  65 - 99 mg/dL   Hemoglobin     Status: Abnormal  Collection Time: 05/16/15  4:59 PM   Result  Value  Ref Range     Hemoglobin  9.1 (L)  13.0 - 17.0 g/dL   Basic metabolic panel     Status: Abnormal     Collection Time: 05/17/15  6:54 AM   Result  Value  Ref Range     Sodium  139  135 - 145 mmol/L     Potassium  3.8  3.5 - 5.1 mmol/L     Chloride  103  101 - 111 mmol/L     CO2  26  22 - 32 mmol/L     Glucose, Bld  107 (H)  65 - 99 mg/dL     BUN  7  6 - 20 mg/dL     Creatinine, Ser  0.92  0.61 - 1.24 mg/dL     Calcium  8.4 (L)  8.9 - 10.3 mg/dL     GFR calc non Af Amer  >60  >60 mL/min     GFR calc Af Amer  >60  >60 mL/min       Comment:  (NOTE)  The eGFR has been calculated using the CKD EPI equation. This calculation has not been validated in all clinical situations. eGFR's persistently <60 mL/min signify possible Chronic Kidney Disease.      Anion gap  10  5 - 15   CBC     Status: Abnormal     Collection Time: 05/17/15  6:54 AM   Result  Value  Ref Range     WBC  12.2 (H)  4.0 - 10.5 K/uL     RBC  2.97 (L)  4.22 - 5.81 MIL/uL     Hemoglobin  9.3 (L)  13.0 - 17.0 g/dL     HCT  28.0 (L)  39.0 - 52.0 %     MCV  94.3  78.0 - 100.0 fL     MCH  31.3  26.0 - 34.0 pg     MCHC  33.2  30.0 - 36.0 g/dL     RDW  13.2  11.5 - 15.5 %     Platelets  244  150 - 400 K/uL       Imaging Results (Last 48 hours)    Ct Angio Head W/cm &/or Wo Cm  05/15/2015  CLINICAL DATA:  24 hour follow-up for stroke with tPA.  EXAM: CT ANGIOGRAPHY HEAD AND NECK TECHNIQUE: Multidetector CT imaging of the head and neck was performed using the standard protocol during bolus administration of intravenous contrast. Multiplanar CT image reconstructions and MIPs were obtained to evaluate the vascular anatomy. Carotid stenosis measurements (when applicable) are obtained utilizing NASCET criteria, using the distal internal carotid diameter as the denominator. CONTRAST:  20m OMNIPAQUE IOHEXOL 350 MG/ML SOLN COMPARISON:  Head CT from yesterday FINDINGS: CT HEAD Brain: Cytotoxic edema in the left occipital lobe without hemorrhagic conversion. 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 Chronic small vessel ischemic changes throughout the bilateral cerebral white matter, mild for age. Calvarium and skull base: No fracture or destructive process noted. Paranasal sinuses: Opacification of the under aerated bilateral mastoids and middle ears. No gross erosive changes. Orbits: Left cataract resection.  No acute finding. CTA NECK Aortic arch: Remarkably extensive and irregular noncalcified plaque on the aortic arch. No visualized aneurysm or dissection. Three vessel branching. Right carotid system: Diffuse predominantly noncalcified atherosclerosis with plaque irregularity along the posterior wall of the proximal ICA. Stenosis measures less  than 50%. Left carotid system: Extensive noncalcified atheromatous plaque with irregularity at the bifurcation. Stenosis approaches 50% at the proximal ICA. Moderate tortuosity of the ICA at the skullbase. Vertebral arteries:Atheromatous but sufficiently patent subclavian arteries. Strong left vertebral artery dominance. Nonvisualized right V1 segment with symmetrically enhancing right V4 segment and graded underfilling and relative hypoenhancement proximally. Intermittent atherosclerotic plaque of the left vertebral artery without flow limiting stenosis. Skeleton: No  aggressive lytic or blastic process noted. Other neck: No incidentally detected mass or adenopathy. Trace left pleural effusion posteriorly. CTA HEAD Anterior circulation: Atherosclerosis of the carotid siphons without flow limiting stenosis. 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 No major branch occlusion or treatable flow limiting stenosis. Posterior circulation: Strongly dominant left vertebral artery. Dysplastic distal basilar with the left superior cerebellar artery from the left P1 segment. Mild atheromatous narrowing and irregularity of the left P1 segment. No treatable flow limiting stenosis. No proximal left PCA branch occlusion to suggest additional tissue at risk. Venous sinuses: Patent Anatomic variants: Left superior cerebellar artery from the P1 segment. Intact anterior communicating artery. Delayed phase: No parenchymal enhancement IMPRESSION: 1. Acute left PCA branch infarct involving the occipital lobe. No hemorrhagic conversion post tPA. 2. Poor flow in the non dominant right vertebral artery, favor high-grade proximal stenosis over dissection. 3. Mild to moderate left P1 segment stenosis. No treatable flow limiting intracranial stenosis. 4. Extensive atherosclerosis, predominately extracranial. Proximal left ICA stenosis approaching 50%. 5. 3 mm left paraclinoid ICA aneurysm. Electronically Signed   By: Monte Fantasia M.D.   On: 05/15/2015 15:12   Ct Angio Neck W/cm &/or Wo/cm  05/15/2015  CLINICAL DATA:  24 hour follow-up for stroke with tPA. EXAM: CT ANGIOGRAPHY HEAD AND NECK TECHNIQUE: Multidetector CT imaging of the head and neck was performed using the standard protocol during bolus administration of intravenous contrast. Multiplanar CT image reconstructions and MIPs were obtained to evaluate the vascular anatomy. Carotid stenosis measurements (when applicable) are obtained utilizing NASCET criteria, using the  distal internal carotid diameter as the denominator. CONTRAST:  55m OMNIPAQUE IOHEXOL 350 MG/ML SOLN COMPARISON:  Head CT from yesterday FINDINGS: CT HEAD Brain: Cytotoxic edema in the left occipital lobe without hemorrhagic conversion. 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 Chronic small vessel ischemic changes throughout the bilateral cerebral white matter, mild for age. Calvarium and skull base: No fracture or destructive process noted. Paranasal sinuses: Opacification of the under aerated bilateral mastoids and middle ears. No gross erosive changes. Orbits: Left cataract resection.  No acute finding. CTA NECK Aortic arch: Remarkably extensive and irregular noncalcified plaque on the aortic arch. No visualized aneurysm or dissection. Three vessel branching. Right carotid system: Diffuse predominantly noncalcified atherosclerosis with plaque irregularity along the posterior wall of the proximal ICA. Stenosis measures less than 50%. Left carotid system: Extensive noncalcified atheromatous plaque with irregularity at the bifurcation. Stenosis approaches 50% at the proximal ICA. Moderate tortuosity of the ICA at the skullbase. Vertebral arteries:Atheromatous but sufficiently patent subclavian arteries. Strong left vertebral artery dominance. Nonvisualized right V1 segment with symmetrically enhancing right V4 segment and graded underfilling and relative hypoenhancement proximally. Intermittent atherosclerotic plaque of the left vertebral artery without flow limiting stenosis. Skeleton: No aggressive lytic or blastic process noted. Other neck: No incidentally detected mass or adenopathy. Trace left pleural effusion posteriorly. CTA HEAD Anterior circulation: Atherosclerosis of the carotid siphons without flow limiting stenosis. There is a  left ICA paraclinoid segment aneurysm directed medially and posteriorly measuring 3 mm in sac diameter. No major branch  occlusion or treatable flow limiting stenosis. Posterior circulation: Strongly dominant left vertebral artery. Dysplastic distal basilar with the left superior cerebellar artery from the left P1 segment. Mild atheromatous narrowing and irregularity of the left P1 segment. No treatable flow limiting stenosis. No proximal left PCA branch occlusion to suggest additional tissue at risk. Venous sinuses: Patent Anatomic variants: Left superior cerebellar artery from the P1 segment. Intact anterior communicating artery. Delayed phase: No parenchymal enhancement IMPRESSION: 1. Acute left PCA branch infarct involving the occipital lobe. No hemorrhagic conversion post tPA. 2. Poor flow in the non dominant right vertebral artery, favor high-grade proximal stenosis over dissection. 3. Mild to moderate left P1 segment stenosis. No treatable flow limiting intracranial stenosis. 4. Extensive atherosclerosis, predominately extracranial. Proximal left ICA stenosis approaching 50%. 5. 3 mm left paraclinoid ICA aneurysm. Electronically Signed   By: Monte Fantasia M.D.   On: 05/15/2015 15:12   Mr Brain Wo Contrast  05/15/2015  CLINICAL DATA:  Stroke. Hypertension, hyperlipidemia, pre diabetes. LAD stenting 05/11/2015 EXAM: MRI HEAD WITHOUT CONTRAST TECHNIQUE: Multiplanar, multiecho pulse sequences of the brain and surrounding structures were obtained without intravenous contrast. COMPARISON:  CT head 05/15/2015 FINDINGS: Acute left PCA infarct involving the left occipital cortex. No other acute infarct Encephalomalacia in the right temporal and frontal lobe. This is chronic and unchanged from prior studies. Adjacent brain appears normal without cortical encephalomalacia. There is a membrane through the encephalomalacia which may be the arachnoid. No associated hemorrhage. This could be an in utero infarct. Chronic microvascular ischemic change in the white matter. Chronic infarct in the left pons. Negative for hemorrhage or mass  lesion.  Negative for hydrocephalus. IMPRESSION: Acute infarct left occipital lobe. Chronic microvascular ischemic changes in the white matter and left pons Encephalomalacia right temporal and frontal lobe is chronic and unchanged. This may be due to in utero infarct. Electronically Signed   By: Franchot Gallo M.D.   On: 05/15/2015 16:56        Medical Problem List and Plan: 1. Functional deficits secondary to left occipital CVA, Gait disturbance related to stroke, right homonymous hemianopsia 2.  DVT Prophylaxis/Anticoagulation: Pharmaceutical: Lovenox 3. Pain Management: Tylenol as needed 4. Mood: Monitor for signs of depression, neuro psych consult if needed 5. Neuropsych: This patient Is capable of making decisions on His own behalf. 6. Skin/Wound Care: Monitor for signs of skin breakdown 7. Fluids/Electrolytes/Nutrition: Intake and output, nutritional supplements as needed 8. CAD s/p STEMI: On ASA, 9.  LGIB:Will need follow-up with GI as an outpatient, we will monitor serial CBCs as well as stools 10. HTN:Was on Coreg, Lasix, Prinivil at home will resume as blood pressure allows      Post Admission Physician Evaluation: 1. Functional deficits secondary  to Left PCA distribution infarct with gait disturbance related stroke.Right homonymous hemianopsia 2. Patient is admitted to receive collaborative, interdisciplinary care between the physiatrist, rehab nursing staff, and therapy team. 3. Patient's level of medical complexity and substantial therapy needs in context of that medical necessity cannot be provided at a lesser intensity of care such as a SNF. 4. Patient has experienced substantial functional loss from his/her baseline which was documented above under the "Functional History" and "Functional Status" headings.  Judging by the patient's diagnosis, physical exam, and functional history, the patient has potential for functional progress which will result in measurable gains while  on inpatient rehab.  These  gains will be of substantial and practical use upon discharge  in facilitating mobility and self-care at the household level. 5. Physiatrist will provide 24 hour management of medical needs as well as oversight of the therapy plan/treatment and provide guidance as appropriate regarding the interaction of the two. 6. 24 hour rehab nursing will assist with bladder management, bowel management, safety, skin/wound care, disease management, medication administration, pain management and patient education  and help integrate therapy concepts, techniques,education, etc. 7. PT will assess and treat for/with: pre gait, gait training, endurance , safety, equipment, neuromuscular re education.   Goals are: Modified independent or supervision. 8. OT will assess and treat for/with:.   Goals are: Mod I/Sup. Therapy may proceed with showering this patient. 9. SLP will assess and treat for/with: Compensatory techniques for right field cut.  Goals are: Scan to write without cueing for reading tasks. 10. Case Management and Social Worker will assess and treat for psychological issues and discharge planning. 11. Team conference will be held weekly to assess progress toward goals and to determine barriers to discharge. 12. Patient will receive at least 3 hours of therapy per day at least 5 days per week. 13. ELOS: 10-14d        14. Prognosis: excellent     Charlett Blake M.D. [facility] FAAPM&R (Sports Med, Neuromuscular Med) Diplomate Am Board of Electrodiagnostic Med  05/17/2015

## 2015-05-18 NOTE — Op Note (Signed)
[facility] [place], [id number]   FLEXIBLE SIGMOIDOSCOPY PROCEDURE REPORT  PATIENT: Damon Walker, Damon Walker  MR#: [id number] BIRTHDATE: 10/10/1945 , 69  yrs. old GENDER: male ENDOSCOPIST: Clarene Essex, MD REFERRED BY: PROCEDURE DATE:  05/18/2015 PROCEDURE:   Sigmoidoscopy with biopsy ASA CLASS:   Class III INDICATIONS:follow up for previously diagnosed colonic polyps. MEDICATIONS: Fentanyl 50 mcg IV and Versed 4 mg IV  DESCRIPTION OF PROCEDURE:   After the risks benefits and alternatives of the procedure were thoroughly explained, informed consent was obtained.  Digital exam revealed internal hemorrhoids. The Pentax EC-[id number]  endoscope was introduced through the anus and advanced to the descending colon , The exam was Without limitations.     The overall prep quality was adequate. . Estimated blood loss is zero unless otherwise noted in this procedure report. The instrument was then slowly withdrawn as the mucosa was fully examined.       the findings are recorded below         Retroflexed views revealed internal Grade I hemorrhoids.    The scope was then withdrawn from the patient and the procedure terminated.  COMPLICATIONS: There were no immediate complications.  ENDOSCOPIC IMPRESSION: 1. Internal hemorrhoids small 2. Distal sigmoid polypectomy site with flat [place] status post biopsy and tattoo with 23mL  of spot over 3 injections3. Proximal sigmoid polypectomy site seen as well 4. Otherwise within normal limits to mid descending without signs of bleeding from above  RECOMMENDATIONS: await pathology if doing well medically repeat colonoscopy in 2-3 years if biopsy okay    and happy to see back sooner when necessary  REPEAT EXAM: as above or as needed  eSigned:  Clarene Essex, MD 05/18/2015 8:47 AM   CC:

## 2015-05-18 NOTE — Progress Notes (Signed)
Damon Lorie Phenix, MD Physician Signed Physical Medicine and Rehabilitation Consult Note 05/16/2015 9:50 AM  Related encounter: ED to Hosp-Admission (Current) from 05/14/2015 in [place] Collapse All        Physical Medicine and Rehabilitation Consult   Reason for Consult: Right [facility] and confusion Referring Physician: Dr. Erlinda Hong.   HPI: Damon Walker is a 69 y.o. RH-male with history of HTN, prediabetes, CAD with ICM s/p cath with stenting for STEMI on 05/11/15. He was discharged to home on 10/09 am and developed sudden onset of confusion with right visual field deficits after lunch. He was re-admitted for work up and CCT negative for acute changes. NIHSS 4 and he was treated with IV tPA due to suspicion of L-PCA infarct. He developed LGIB with multiple episodes of rectal bleeding with clots post tPA. He had drop in H/H and was treated with FFP but continued to have bleeding episodes. Dr. Cristina Gong was consulted for input and recommended supportive care with serial monitoring of H/H with transfusion for next 1-2 days and bleeding scan for work up in case of medical instability. 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 Dr. Erlinda Hong felt that patient with cardioembolic L-PCA infarct due to low EF and recent MI. Repeat 2D echo with EF 35-40% with moderate hypokinesis of mid-apical anterior and lateral septal myocardium. Cardiology consulted and plavix resumed yesterday. Patient with resultant confusion, Right HH and poor awarenss of deficits. PT evaluation done this am and CIR recommended for follow up therapy. Pt lives in a 3 story home, with a bed/bath on the second level with 5 steps.   Review of Systems  Constitutional: Negative for diaphoresis.  HENT: Negative for hearing loss.  Eyes: Negative for pain.  Respiratory:  Negative for cough and shortness of breath.  Cardiovascular: Negative for chest pain, palpitations and leg swelling.  Gastrointestinal: Positive for blood in stool (none since yesterday afternoon). Negative for heartburn and nausea.  Genitourinary: Negative for dysuria and urgency.  Musculoskeletal: Positive for joint pain (history of hip pain).  Neurological: Negative for dizziness, tingling and headaches.  Psychiatric/Behavioral: Positive for memory loss. Negative for depression. The patient is not nervous/anxious.  All other systems reviewed and are negative.     Past Medical History  Diagnosis Date  . Untreated Hypertension   . Tobacco abuse     a. 30 yrs - 1.5 ppd.  Marland Kitchen HLD (hyperlipidemia)   . Carotid arterial disease ([place])   . CAD (coronary artery disease)     Cath 05/11/2015 three-vessel CAD with 90% prox RCA, 80% mid RCA, 80% OM3, 100% prox LAD occlusion treated with DESx2. RCA and OM residual treated medically for now, however will need PCI if has recurrent CP  . Prediabetes     A1C 6.0 in Oct 2016  . Ischemic cardiomyopathy     EF 40% on cath 05/11/2015 after anterior STEMI, EF 20-25% on echo 05/13/2015    Past Surgical History  Procedure Laterality Date  . S/p appendectomy      Age 66  . S/p inguinal hernia repair      In his 20's.  . Cardiac catheterization N/A 05/11/2015    Procedure: Left Heart Cath and Coronary Angiography; Surgeon: Peter M Martinique, MD; Location: [place] [facility]; Service: Cardiovascular; Laterality: N/A;  . Cardiac catheterization N/A 05/11/2015    Procedure: Coronary Stent  Intervention; Surgeon: Peter M Martinique, MD; Location: [place] [facility]; Service: Cardiovascular; Laterality: N/A;    Family History  Problem Relation Age of Onset  . Adopted: Yes  . Other      Adopted - unaware of biological parent's histories.  . Other Mother     eczema    . Cancer Mother     Social History: Damon Walker works days, but she states she is able to be at home for 24/7 support. 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 He reports that he has been smoking Cigarettes--1.5 PPD. He has a 30 pack-year smoking history. He does not have any smokeless tobacco history on file. He reports that he does not drink alcohol or use illicit drugs.   Allergies  Allergen Reactions  . Latex Itching and Rash    Medications Prior to Admission  Medication Sig Dispense Refill  . aspirin 81 MG chewable tablet Chew 1 tablet (81 mg total) by mouth daily.    . carvedilol (COREG) 3.125 MG tablet Take 3 tablets (9.375 mg total) by mouth 2 (two) times daily with a meal. 180 tablet 5  . furosemide (LASIX) 20 MG tablet Take 1 tablet (20 mg total) by mouth as needed for fluid or edema (for edema and shortness of breath). 30 tablet 3  . lisinopril (PRINIVIL,ZESTRIL) 5 MG tablet Take 1 tablet (5 mg total) by mouth daily. 30 tablet 11  . nitroGLYCERIN (NITROSTAT) 0.4 MG SL tablet Place 1 tablet (0.4 mg total) under the tongue every 5 (five) minutes x 3 doses as needed for chest pain. 25 tablet 3  . pantoprazole (PROTONIX) 40 MG tablet Take 40 mg by mouth 2 (two) times daily.    . rosuvastatin (CRESTOR) 40 MG tablet Take 1 tablet (40 mg total) by mouth daily at 6 PM. 90 tablet 3  . ticagrelor (BRILINTA) 90 MG TABS tablet Take 1 tablet (90 mg total) by mouth 2 (two) times daily. 60 tablet 11    Home: Home Living Family/patient expects to be discharged to:: Private residence Living Arrangements: Spouse/significant other Available Help at Discharge: Family, Available PRN/intermittently (Wife works) Type of Home: House Home Access: Level entry Home Layout: Multi-level, 1/2 bath on main level Alternate Level Stairs-Number of Steps: flight Alternate Level Stairs-Rails: Right Home Equipment: None  Functional History: Prior  Function Level of Independence: Independent Functional Status:  Mobility: Bed Mobility Overal bed mobility: Needs Assistance Bed Mobility: Supine to Sit Supine to sit: Min guard General bed mobility comments: When pt is given increased time pt is able to struggle to sit at EOB without A, but seems difficult for pt.  Transfers Overall transfer level: Needs assistance Equipment used: None Transfers: Sit to/from Stand Sit to Stand: Min assist General transfer comment: pt with L lateral lean with coming to stand and needs cueing to correct posture.  Ambulation/Gait Ambulation/Gait assistance: Min assist Ambulation Distance (Feet): 75 Feet Assistive device: None Gait Pattern/deviations: Step-through pattern, Decreased stride length, Narrow base of support, Drifts right/left General Gait Details: pt drifts to L side and at times runs into objects on L side. pt needs verbal and tactile cueing for attending to R side and correcting L lateral lean. pt with difficulty in conversing while ambulating and other multitasking tasks.     ADL:    Cognition: Cognition Overall Cognitive Status: Impaired/Different from baseline Orientation Level: Oriented to person, Oriented to place, Disoriented to time, Oriented to situation Cognition Arousal/Alertness: Awake/alert Behavior During Therapy: WFL for tasks assessed/performed Overall Cognitive Status: Impaired/Different  from baseline Area of Impairment: Orientation, Memory, Safety/judgement, Awareness, Problem solving, Attention Orientation Level: Disoriented to, Situation, Time Current Attention Level: Selective Memory: Decreased short-term memory, Decreased recall of precautions Safety/Judgement: Decreased awareness of safety, Decreased awareness of deficits Awareness: Emergent Problem Solving: Slow processing, Difficulty sequencing, Requires verbal cues, Requires tactile cues General Comments: pt unable to recall situation even when he  was told situation ~70mins prior. pt unable to recall month or day of the month, but with increased time was able to recall year.   Blood pressure 113/69, pulse 81, temperature 99.4 F (37.4 C), temperature source Oral, resp. rate 26, height 5\' 8"  (1.727 m), weight 89 kg (196 lb 3.4 oz), SpO2 98 %. Physical Exam  Nursing note and vitals reviewed. Constitutional: He is oriented to person, place, and time. He appears well-developed and well-nourished.  HENT:  Head: Normocephalic and atraumatic.  Eyes: Conjunctivae are normal. Pupils are equal, round, and reactive to light.  Neck: Normal range of motion. Neck supple.  Cardiovascular: Normal rate and regular rhythm.  Respiratory: Effort normal and breath sounds normal. No respiratory distress. He has no wheezes.  GI: Soft. Bowel sounds are normal. He exhibits no distension. There is no tenderness.  Musculoskeletal: He exhibits no edema or tenderness.  Strength 4+/5 grossly throughout  Neurological: He is alert and oriented to person, place, and time. He displays normal reflexes.  Speech clear. Was able to state age/DOB but had difficulty stating month or day. Lacks awareness of deficits. Right HH noted. He was able to follow one and two step commands without difficulty.  Right sided neglect Sensation to light touch intact  Skin: Skin is warm and dry. No rash noted.  Psychiatric: He has a normal mood and affect. He is slowed.     Lab Results Last 24 Hours    Results for orders placed or performed during the hospital encounter of 05/14/15 (from the past 24 hour(s))  Glucose, capillary Status: None   Collection Time: 05/15/15 4:05 PM  Result Value Ref Range   Glucose-Capillary 78 65 - 99 mg/dL  Hemoglobin Status: Abnormal   Collection Time: 05/15/15 5:16 PM  Result Value Ref Range   Hemoglobin 9.5 (L) 13.0 - 17.0 g/dL  Glucose, capillary Status: Abnormal   Collection Time: 05/15/15 8:09 PM    Result Value Ref Range   Glucose-Capillary 128 (H) 65 - 99 mg/dL  Basic metabolic panel Status: Abnormal   Collection Time: 05/16/15 3:30 AM  Result Value Ref Range   Sodium 138 135 - 145 mmol/L   Potassium 3.8 3.5 - 5.1 mmol/L   Chloride 109 101 - 111 mmol/L   CO2 22 22 - 32 mmol/L   Glucose, Bld 101 (H) 65 - 99 mg/dL   BUN 9 6 - 20 mg/dL   Creatinine, Ser 0.73 0.61 - 1.24 mg/dL   Calcium 8.2 (L) 8.9 - 10.3 mg/dL   GFR calc non Af Amer >60 >60 mL/min   GFR calc Af Amer >60 >60 mL/min   Anion gap 7 5 - 15  CBC Status: Abnormal   Collection Time: 05/16/15 3:30 AM  Result Value Ref Range   WBC 13.3 (H) 4.0 - 10.5 K/uL   RBC 3.07 (L) 4.22 - 5.81 MIL/uL   Hemoglobin 9.5 (L) 13.0 - 17.0 g/dL   HCT 29.2 (L) 39.0 - 52.0 %   MCV 95.1 78.0 - 100.0 fL   MCH 30.9 26.0 - 34.0 pg   MCHC 32.5 30.0 - 36.0 g/dL  RDW 13.2 11.5 - 15.5 %   Platelets 222 150 - 400 K/uL  Glucose, capillary Status: None   Collection Time: 05/16/15 7:31 AM  Result Value Ref Range   Glucose-Capillary 98 65 - 99 mg/dL  Glucose, capillary Status: Abnormal   Collection Time: 05/16/15 11:20 AM  Result Value Ref Range   Glucose-Capillary 127 (H) 65 - 99 mg/dL      Imaging Results (Last 48 hours)    Ct Angio Head W/cm &/or Wo Cm  05/15/2015 CLINICAL DATA: 24 hour follow-up for stroke with tPA. EXAM: CT ANGIOGRAPHY HEAD AND NECK TECHNIQUE: Multidetector CT imaging of the head and neck was performed using the standard protocol during bolus administration of intravenous contrast. Multiplanar CT image reconstructions and MIPs were obtained to evaluate the vascular anatomy. Carotid stenosis measurements (when applicable) are obtained utilizing NASCET criteria, using the distal internal carotid diameter as the denominator. CONTRAST: 21mL OMNIPAQUE IOHEXOL 350 MG/ML SOLN  COMPARISON: Head CT from yesterday FINDINGS: CT HEAD Brain: Cytotoxic edema in the left occipital lobe without hemorrhagic conversion. 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 Chronic small vessel ischemic changes throughout the bilateral cerebral white matter, mild for age. Calvarium and skull base: No fracture or destructive process noted. Paranasal sinuses: Opacification of the under aerated bilateral mastoids and middle ears. No gross erosive changes. Orbits: Left cataract resection. No acute finding. CTA NECK Aortic arch: Remarkably extensive and irregular noncalcified plaque on the aortic arch. No visualized aneurysm or dissection. Three vessel branching. Right carotid system: Diffuse predominantly noncalcified atherosclerosis with plaque irregularity along the posterior wall of the proximal ICA. Stenosis measures less than 50%. Left carotid system: Extensive noncalcified atheromatous plaque with irregularity at the bifurcation. Stenosis approaches 50% at the proximal ICA. Moderate tortuosity of the ICA at the skullbase. Vertebral arteries:Atheromatous but sufficiently patent subclavian arteries. Strong left vertebral artery dominance. Nonvisualized right V1 segment with symmetrically enhancing right V4 segment and graded underfilling and relative hypoenhancement proximally. Intermittent atherosclerotic plaque of the left vertebral artery without flow limiting stenosis. Skeleton: No aggressive lytic or blastic process noted. Other neck: No incidentally detected mass or adenopathy. Trace left pleural effusion posteriorly. CTA HEAD Anterior circulation: Atherosclerosis of the carotid siphons without flow limiting stenosis. 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 No major branch occlusion or treatable flow limiting stenosis. Posterior circulation: Strongly dominant  left vertebral artery. Dysplastic distal basilar with the left superior cerebellar artery from the left P1 segment. Mild atheromatous narrowing and irregularity of the left P1 segment. No treatable flow limiting stenosis. No proximal left PCA branch occlusion to suggest additional tissue at risk. Venous sinuses: Patent Anatomic variants: Left superior cerebellar artery from the P1 segment. Intact anterior communicating artery. Delayed phase: No parenchymal enhancement IMPRESSION: 1. Acute left PCA branch infarct involving the occipital lobe. No hemorrhagic conversion post tPA. 2. Poor flow in the non dominant right vertebral artery, favor high-grade proximal stenosis over dissection. 3. Mild to moderate left P1 segment stenosis. No treatable flow limiting intracranial stenosis. 4. Extensive atherosclerosis, predominately extracranial. Proximal left ICA stenosis approaching 50%. 5. 3 mm left paraclinoid ICA aneurysm. Electronically Signed By: Monte Fantasia M.D. On: 05/15/2015 15:12   Ct Head Wo Contrast  05/14/2015 CLINICAL DATA: Code stroke. Heart catheterization with subsequent onset of headache and bilateral blurred vision EXAM: CT HEAD WITHOUT CONTRAST TECHNIQUE: Contiguous axial images were obtained from the base of the skull  through the vertex without intravenous contrast. COMPARISON: 06/22/2014 FINDINGS: No sign of acute infarction by CT. 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 This is not acute. No hydrocephalus. No hemorrhage. No mass lesion. No calvarial abnormality of an acute nature. Sinuses are clear. There is fluid in the middle ears bilaterally. IMPRESSION: No acute finding by CT. 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 These results were called by telephone at the time of interpretation on 05/14/2015 at 2:36 pm to Dr. Orlie Dakin, who verbally acknowledged these results. Electronically Signed By: Nelson Chimes M.D. On: 05/14/2015 14:39   Ct Angio Neck W/cm &/or Wo/cm  05/15/2015 CLINICAL DATA: 24 hour follow-up for stroke with tPA. EXAM: CT ANGIOGRAPHY HEAD AND NECK TECHNIQUE: Multidetector CT imaging of the head and neck was performed using the standard protocol during bolus administration of intravenous contrast. Multiplanar CT image reconstructions and MIPs were obtained to evaluate the vascular anatomy. Carotid stenosis measurements (when applicable) are obtained utilizing NASCET criteria, using the distal internal carotid diameter as the denominator. CONTRAST: 71mL OMNIPAQUE IOHEXOL 350 MG/ML SOLN COMPARISON: Head CT from yesterday FINDINGS: CT HEAD Brain: Cytotoxic edema in the left occipital lobe without hemorrhagic conversion. 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 Chronic small vessel ischemic changes throughout the bilateral cerebral white matter, mild for age. Calvarium and skull base: No fracture or destructive process noted. Paranasal sinuses: Opacification of the under aerated bilateral mastoids and middle ears. No gross erosive changes. Orbits: Left cataract resection. No acute finding. CTA NECK Aortic arch: Remarkably extensive and irregular noncalcified plaque on the aortic arch. No visualized aneurysm or dissection. Three vessel branching. Right carotid system: Diffuse predominantly noncalcified atherosclerosis with plaque irregularity along the posterior wall of the proximal ICA. Stenosis measures less than 50%. Left carotid system: Extensive noncalcified atheromatous plaque with irregularity at the bifurcation. Stenosis approaches 50% at the proximal ICA. Moderate  tortuosity of the ICA at the skullbase. Vertebral arteries:Atheromatous but sufficiently patent subclavian arteries. Strong left vertebral artery dominance. Nonvisualized right V1 segment with symmetrically enhancing right V4 segment and graded underfilling and relative hypoenhancement proximally. Intermittent atherosclerotic plaque of the left vertebral artery without flow limiting stenosis. Skeleton: No aggressive lytic or blastic process noted. Other neck: No incidentally detected mass or adenopathy. Trace left pleural effusion posteriorly. CTA HEAD Anterior circulation: Atherosclerosis of the carotid siphons without flow limiting stenosis. 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 No major branch occlusion or treatable flow limiting stenosis. Posterior circulation: Strongly dominant left vertebral artery. Dysplastic distal basilar with the left superior cerebellar artery from the left P1 segment. Mild atheromatous narrowing and irregularity of the left P1 segment. No treatable flow limiting stenosis. No proximal left PCA branch occlusion to suggest additional tissue at risk. Venous sinuses: Patent Anatomic variants: Left superior cerebellar artery from the P1 segment. Intact anterior communicating artery. Delayed phase: No parenchymal enhancement IMPRESSION: 1. Acute left PCA branch infarct involving the occipital lobe. No hemorrhagic conversion post tPA. 2. Poor flow in the non dominant right vertebral artery, favor high-grade proximal stenosis over dissection. 3. Mild to moderate left P1 segment stenosis. No treatable flow limiting intracranial  stenosis. 4. Extensive atherosclerosis, predominately extracranial. Proximal left ICA stenosis approaching 50%. 5. 3 mm left paraclinoid ICA aneurysm. Electronically Signed By: Monte Fantasia M.D. On: 05/15/2015 15:12   Mr Brain Wo Contrast  05/15/2015 CLINICAL DATA: Stroke.  Hypertension, hyperlipidemia, pre diabetes. LAD stenting 05/11/2015 EXAM: MRI HEAD WITHOUT CONTRAST TECHNIQUE: Multiplanar, multiecho pulse sequences of the brain and surrounding structures were obtained without intravenous contrast. COMPARISON: CT head 05/15/2015 FINDINGS: Acute left PCA infarct involving the left occipital cortex. No other acute infarct Encephalomalacia in the right temporal and frontal lobe. This is chronic and unchanged from prior studies. Adjacent brain appears normal without cortical encephalomalacia. There is a membrane through the encephalomalacia which may be the arachnoid. No associated hemorrhage. This could be an in utero infarct. Chronic microvascular ischemic change in the white matter. Chronic infarct in the left pons. Negative for hemorrhage or mass lesion. Negative for hydrocephalus. IMPRESSION: Acute infarct left occipital lobe. Chronic microvascular ischemic changes in the white matter and left pons Encephalomalacia right temporal and frontal lobe is chronic and unchanged. This may be due to in utero infarct. Electronically Signed By: Franchot Gallo M.D. On: 05/15/2015 16:56     Assessment/Plan: Diagnosis: Left occipital CVA Labs and images independently reviewed. Old records reviewed and summated above. Stroke: Continue secondary stroke prophylaxis and Risk Factor Modification listed below:  Antiplatelet therapy: ASA, Clopidogrel Blood Pressure Management: Continue current medication with prn's with permisive HTN per primary team (duration and range). PRN labetolol Statin Agent: Crestor Diabetes management: Diet and excercise Tobacco abuse: Consider counseling PT/OT for mobility, ADL training    1. Does the need for close, 24 hr/day medical supervision in concert with the patient's rehab needs make it unreasonable for this patient to be served in a less intensive setting? Yes 2. Co-Morbidities requiring supervision/potential  complications: HTN, recent STEMI, GI bleed, confusion, tachypnea, leukocytosis, ABLA.  3. Due to safety, disease management, medication administration and patient education, does the patient require 24 hr/day rehab nursing? Yes 4. Does the patient require coordinated care of a physician, rehab nurse, PT (1-2 hrs/day, 5 days/week), OT (1-2 hrs/day, 5 days/week) and SLP (1-2 hrs/day, 5 days/week) to address physical and functional deficits in the context of the above medical diagnosis(es)? Yes Addressing deficits in the following areas: balance, endurance, locomotion, transferring, dressing, feeding, grooming, toileting, cognition, language and psychosocial support 5. Can the patient actively participate in an intensive therapy program of at least 3 hrs of therapy per day at least 5 days per week? Potentially 6. The potential for patient to make measurable gains while on inpatient rehab is good 7. Anticipated functional outcomes upon discharge from inpatient rehab are modified independent and supervision with PT, modified independent and supervision with OT, independent with SLP. 8. Estimated rehab length of stay to reach the above functional goals is: 7-10 days 9. Does the patient have adequate social supports and living environment to accommodate these discharge functional goals? Potentially 10. Anticipated D/C setting: Home 11. Anticipated post D/C treatments: HH therapy and Home excercise program 12. Overall Rehab/Functional Prognosis: good  RECOMMENDATIONS: This patient's condition is appropriate for continued rehabilitative care in the following setting: CIR Patient has agreed to participate in recommended program. Yes Note that insurance prior authorization may be required for reimbursement for recommended care.  Comment: Rehab Admissions Coordinator to follow up   Delice Lesch, MD 05/16/2015

## 2015-05-18 NOTE — Progress Notes (Signed)
[name] Rehab Admission Coordinator Signed Physical Medicine and Rehabilitation PMR Pre-admission 05/18/2015 12:02 PM  Related encounter: ED to Hosp-Admission (Current) from 05/14/2015 in [facility] [facility] Collapse All   PMR Admission Coordinator Pre-Admission Assessment  Patient: Damon Walker is an 69 y.o., male MRN: [id number] DOB: 1946/06/30 Height: 5\' 8"  (172.7 cm) Weight: 89 kg (196 lb 3.4 oz)  Insurance Information HMO: yes PPO: PCP: IPA: 80/20: OTHER:  PRIMARY: BCBS Medicare Policy#: [id number] Subscriber: self CM Name: Cedrick Phone#: [phone] Fax#: [phone] Pre-Cert#: auth received for IP Rehab admission on 10/13, Cedrick to provide auth # once pt. on the unit Employer: retired Benefits: Phone #: [phone] Name: Marquette Saa. Date: 08/05/14 Deduct: $0 Out of Pocket Max: [phone] Life Max: none CIR: $295 days 1-6/ 100% days 7+ SNF: $0 days 1-20; $75 daily for days 21-49; $100 daily, days 50+ Outpatient: 100% Co-Pay: $40 Home Health: 100% Co-Pay: none DME: 80% Co-Pay: 20% Providers: In network SECONDARY: Policy#: Subscriber:  CM Name: Phone#: Fax#:  Pre-Cert#: Employer:  Benefits: Phone #: Name:  Eff. Date: Deduct: Out of Pocket Max: Life Max:  CIR: SNF:  Outpatient: Co-Pay:  Home Health: Co-Pay:  DME: Co-Pay:   Medicaid Application Date: Case Manager:  Disability Application Date: Case Worker:   Emergency Contact Information Contact Information    Name Relation Home Work Mobile   Taranto,Valerie Spouse [phone]       Current Medical History  Patient  Admitting Diagnosis: Left occipital CVA History of Present Illness: Damon Walker is a 69 y.o. RH-male with history of HTN, prediabetes, CAD with ICM s/p cath with stenting for STEMI on 05/11/15. He was discharged to home on 10/09 am and developed sudden onset of confusion with right visual field deficits after lunch. He was re-admitted for work up and CCT negative for acute changes. NIHSS 4 and he was treated with IV tPA due to suspicion of L-PCA infarct. He developed LGIB with multiple episodes of rectal bleeding with clots post tPA. He had drop in H/H and was treated with FFP but continued to have bleeding episodes. Dr. Cristina Gong was consulted for input and recommended supportive care with serial monitoring of H/H with transfusion for next 1-2 days and bleeding scan for work up in case of medical instability.  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 Dr. Erlinda Hong felt that patient with cardioembolic L-PCA infarct due to low EF and recent MI. Repeat 2D echo with EF 35-40% with moderate hypokinesis of mid-apical anterior and lateral septal myocardium.   CTA head/neck with acute L-PCA infarct with mil to moderate P1 stenosis and extensive atherosclerosis Left Cardiology consulted and converted Brilinta to plavix to keep stents open. He was cleared for sigmoidscopy which was recommended by GI due to recent incompletely excised neuroendocrine colonic polyp. He underwent sigmoidoscopy today revealing small internal hemorrhoids and two prior biopsy sites without evidence of bleeding. Patient with resultant confusion, tendency to drift to the left and cognitive deficits--he continues to deny visual deficits. Therapy ongoing and admitted to CIR on 05/18/15 Total: 1 NIH    Past Medical History  Past Medical History  Diagnosis Date  . Untreated Hypertension   . Tobacco abuse      a. 30 yrs - 1.5 ppd.  Marland Kitchen HLD (hyperlipidemia)   . Carotid arterial disease ([place])   . CAD (coronary artery disease)  Cath 05/11/2015 three-vessel CAD with 90% prox RCA, 80% mid RCA, 80% OM3, 100% prox LAD occlusion treated with DESx2. RCA and OM residual treated medically for now, however will need PCI if has recurrent CP  . Prediabetes     A1C 6.0 in Oct 2016  . Ischemic cardiomyopathy     EF 40% on cath 05/11/2015 after anterior STEMI, EF 20-25% on echo 05/13/2015    Family History  family history includes Cancer in his mother; Other in his mother and another family member. He was adopted.  Prior Rehab/Hospitalizations:  Has the patient had major surgery during 100 days prior to admission? Yes; pt. Underwent recent cardiac stenting for STEMI  Current Medications   Current facility-administered medications:  . 0.9 % sodium chloride infusion, , Intravenous, Continuous, [name], MD, Last Rate: 75 mL/hr at 05/18/15 1029 . acetaminophen (TYLENOL) tablet 650 mg, 650 mg, Oral, Q4H PRN, 650 mg at 05/16/15 2233 **OR** [DISCONTINUED] acetaminophen (TYLENOL) suppository 650 mg, 650 mg, Rectal, Q4H PRN, [name], MD . aspirin EC tablet 81 mg, 81 mg, Oral, Daily, [name], MD, 81 mg at 05/18/15 1028 . clopidogrel (PLAVIX) tablet 75 mg, 75 mg, Oral, Daily, [name], MD, 75 mg at 05/18/15 1028 . labetalol (NORMODYNE,TRANDATE) injection 10 mg, 10 mg, Intravenous, Q2H PRN, [name], MD . pantoprazole (PROTONIX) EC tablet 40 mg, 40 mg, Oral, Daily, [name], MD, 40 mg at 05/18/15 0936 . rosuvastatin (CRESTOR) tablet 40 mg, 40 mg, Oral, q1800, [name], MD, 40 mg at 05/17/15 1835 . senna-docusate (Senokot-S) tablet 1 tablet, 1 tablet, Oral, QHS PRN, [name], MD  Patients Current Diet: Diet Heart Room service appropriate?: Yes; Fluid consistency:: Thin  Precautions / Restrictions Precautions Precautions: Fall, Other  (comment) Precaution Comments: R visual deficits. Restrictions Weight Bearing Restrictions: No   Has the patient had 2 or more falls or a fall with injury in the past year?No  Prior Activity Level Community (5-7x/wk): Per wife, pt. out of the home about every other day for shopping and errands  Development worker, international aid / [place] Devices/Equipment: None Home Equipment: None  Prior Device Use: Indicate devices/aids used by the patient prior to current illness, exacerbation or injury? None of the above; no device used PTA  Prior Functional Level Prior Function Level of Independence: Independent Comments: Pt. enjoys yard work and has been remodeling the basement  Self Care: Did the patient need help bathing, dressing, using the toilet or eating? Independent  Indoor Mobility: Did the patient need assistance with walking from room to room (with or without device)? Independent  Stairs: Did the patient need assistance with internal or external stairs (with or without device)? Independent  Functional Cognition: Did the patient need help planning regular tasks such as shopping or remembering to take medications? Independent  Current Functional Level Cognition  Arousal/Alertness: Awake/alert Overall Cognitive Status: Impaired/Different from baseline Current Attention Level: Selective Orientation Level: Oriented to person, Oriented to place, Disoriented to time, Oriented to situation Safety/Judgement: Decreased awareness of safety, Decreased awareness of deficits General Comments: Pt unable to recount situation. states he is "confused" Attention: Sustained Sustained Attention: Appears intact Memory: Impaired Memory Impairment: Storage deficit, Retrieval deficit Awareness: Impaired Awareness Impairment: Emergent impairment   Extremity Assessment (includes Sensation/Coordination)  Upper Extremity Assessment: Overall WFL for tasks assessed (will further assess)   Lower Extremity Assessment: RLE deficits/detail RLE Deficits / Details: Mild weakness grossly 4/5 and decreased coordination.  RLE Coordination: decreased fine motor  ADLs  Overall ADL's : Needs assistance/impaired Grooming: Wash/dry hands, Wash/dry face, Oral care, Brushing hair, Cueing for sequencing, Cueing for compensatory techniques, Minimal assistance, Cueing for safety, Standing (Frequent VC's for task initiation and due to visual deficits) Upper Body Bathing: Minimal assitance Lower Body Bathing: Minimal assistance, Sit to/from stand, Cueing for safety Upper Body Dressing : Moderate assistance Lower Body Dressing: Moderate assistance Toilet Transfer: Minimal assistance, Ambulation, Cueing for safety (Cues to look to right as pt is unaware of visual deficits on right) Toileting- Clothing Manipulation and Hygiene: Min guard, Sit to/from stand, Cueing for safety Functional mobility during ADLs: Minimal assistance General ADL Comments: Pt is unaware of right visual field deficits and requires Min A during functional mobility and ADL's to assist with compensation techniques, pt requires consistent Min-mod vc's during treatment session today. Pt also with noted difficulty with following commands/task initiation.    Mobility  Overal bed mobility: Needs Assistance Bed Mobility: Supine to Sit Supine to sit: Min guard General bed mobility comments: Increased time, HOB elevated and use of rails. Pt was able to transition to EOB slowly with increased difficulty scooting out to get feet on floor. Close guard for safety.     Transfers  Overall transfer level: Needs assistance Equipment used: None Transfers: Sit to/from Stand Sit to Stand: Min assist General transfer comment: Steadying assist to power-up to full standing position. Demonstrates L lateral lean and increased time to achieve full upright posture prior to initiating gait.    Ambulation / Gait / Stairs / Wheelchair  Mobility  Ambulation/Gait Ambulation/Gait assistance: Museum/gallery curator (Feet): 250 Feet Assistive device: None Gait Pattern/deviations: Step-through pattern, Decreased stride length, Trunk flexed General Gait Details: pt drifts to L side and at times runs into objects on L side. pt needs verbal and tactile cueing for attending to R side and correcting L lateral lean. pt with difficulty in conversing while ambulating and other multitasking tasks.  Gait velocity: Decreased Gait velocity interpretation: Below normal speed for age/gender Stairs: Yes Stairs assistance: Min assist Stair Management: Two rails, Forwards Number of Stairs: 11 General stair comments: Pt was cued to hold bialteral rails. Pt only reached for L railing and when cued again to hold the R railing, pt states he did not see it there. Min assist for balance/safety on stairs.    Posture / Balance Balance Overall balance assessment: Needs assistance Sitting-balance support: No upper extremity supported, Feet supported Sitting balance-Leahy Scale: Good Postural control: Left lateral lean Standing balance support: During functional activity, No upper extremity supported Standing balance-Leahy Scale: Fair    Special needs/care consideration BiPAP/CPAP no CPM no Continuous Drip IV no Dialysis no  Life Vest no Oxygen no Special Bed no Trach Size no Wound Vac (area) no  Skin intact  Bowel mgmt: Last BM 10/12 Bladder mgmt: continent Diabetic mgmt A1c 6.0     Previous Home Environment Living Arrangements: Spouse/significant other Available Help at Discharge: Other (Comment) (wife states she will arrange for 24/7 supervision) Type of Home: House Home Layout: Multi-level, 1/2 bath on main level Alternate Level Stairs-Rails: Right Alternate Level Stairs-Number of Steps: flight Home Access: Level entry Toombs: No  Discharge  Living Setting Plans for Discharge Living Setting: Patient's home Type of Home at Discharge: House Discharge Home Layout: Multi-level, Other (Comment) (tri level per wife) Alternate Level Stairs-Rails: Right Alternate Level Stairs-Number of Steps: 5 (5 steps from one level to the next) Discharge Home Access: Stairs to enter Entrance Stairs-Rails: Right,  Left Entrance Stairs-Number of Steps: 5 Discharge Bathroom Shower/Tub: Walk-in shower Discharge Bathroom Toilet: Standard Discharge Bathroom Accessibility: Yes How Accessible: Accessible via walker Does the patient have any problems obtaining your medications?: (wife reports financial constraints with new meds)  Social/Family/Support Systems Patient Roles: Spouse Anticipated Caregiver: wife, Fremon Zacharia [phone] Ability/Limitations of Caregiver: wife works at a [employer] from 6 am till 2:30 pm. She indicates that she plans to take take off from work to be with her husband. We discussed that pt. could have ongoing needs for 24/7 supervision Caregiver Availability: 24/7 Discharge Plan Discussed with Primary Caregiver: Yes Is Caregiver In Agreement with Plan?: Yes Does Caregiver/Family have Issues with Lodging/Transportation while Pt is in Rehab?: No   Goals/Additional Needs Patient/Family Goal for Rehab: modified independent and supervision with PT/OT; independent with SLP Expected length of stay: 7-10 days Cultural Considerations: none Dietary Needs: heart healthy, thin liquids Equipment Needs: TBA Additional Information: Pt. is from [place] to Admission and willing to participate: Yes Program Orientation Provided & Reviewed with Pt/Caregiver Including Roles & Responsibilities: Yes   Decrease burden of Care through IP rehab admission: no   Possible need for SNF placement upon discharge: Not anticipated   Patient Condition: This patient's condition remains as documented in the consult dated  05/16/15 , in which the Rehabilitation Physician determined and documented that the patient's condition is appropriate for intensive rehabilitative care in an inpatient rehabilitation facility. Will admit to inpatient rehab today.  Preadmission Screen Completed By: [name], 05/18/2015 12:29 PM ______________________________________________________________________  Discussed status with Dr.  Letta Pate on 05/18/15 at 1228 and received telephone approval for admission today.  Admission Coordinator: [name], time 1228 Sudie Grumbling 05/18/15          Cosigned by: Charlett Blake, MD at 05/18/2015 12:54 PM  Revision History     Date/Time User Provider Type Action   05/18/2015 12:54 PM Charlett Blake, MD Physician Cosign   05/18/2015 12:30 PM [name] Rehab Admission Coordinator Sign

## 2015-05-18 NOTE — Progress Notes (Signed)
Occupational Therapy Treatment Patient Details Name: Damon Walker MRN: [id number] DOB: 1946/07/11 Today's Date: 05/18/2015    History of present illness pt presents with L PCA Infarct, GIB, and recent admit 10/6 - 10/9 for MI.  pt with hx of HTN and CAD.     OT comments  Pt seen for ADL retraining session today with focus on attending and locating objects to his right. He is unaware of right visual field deficits and requires Min A during functional mobility and ADL's to assist with compensation techniques. Pt requires consistent Min-mod vc's during treatment session today. Pt also with noted difficulty with following commands/task initiation. Continue to recommend CIR for in-pt rehab to address deficits and assist in maximizing independence with ADL/IADL's.   Follow Up Recommendations  CIR;Supervision/Assistance - 24 hour    Equipment Recommendations  3 in 1 bedside comode    Recommendations for Other Services Rehab consult    Precautions / Restrictions Precautions Precautions: Fall;Other (comment) Precaution Comments: R visual deficits. Restrictions Weight Bearing Restrictions: No       Mobility Bed Mobility Overal bed mobility: Needs Assistance Bed Mobility: Supine to Sit     Supine to sit: Min guard     General bed mobility comments: Increased time, HOB elevated and use of rails. Pt was able to transition to EOB slowly with increased difficulty scooting out to get feet on floor. Close guard for safety.   Transfers Overall transfer level: Needs assistance   Transfers: Sit to/from Stand Sit to Stand: Min assist         General transfer comment: Steadying assist to power-up to full standing position. Demonstrates L lateral lean and increased time to achieve full upright posture prior to initiating gait.    Balance Overall balance assessment: Needs assistance Sitting-balance support: No upper extremity supported;Feet supported Sitting balance-Leahy Scale:  Good   Postural control: Left lateral lean Standing balance support: During functional activity;No upper extremity supported Standing balance-Leahy Scale: Fair                     ADL Overall ADL's : Needs assistance/impaired     Grooming: Wash/dry hands;Wash/dry face;Oral care;Brushing hair;Cueing for sequencing;Cueing for compensatory techniques;Minimal assistance;Cueing for safety;Standing (Frequent VC's for task initiation and due to visual deficits)   Upper Body Bathing: Minimal assitance   Lower Body Bathing: Minimal assistance;Sit to/from stand;Cueing for safety           Toilet Transfer: Minimal assistance;Ambulation;Cueing for safety (Cues to look to right as pt is unaware of visual deficits on right)   Toileting- Clothing Manipulation and Hygiene: Min guard;Sit to/from stand;Cueing for safety         General ADL Comments: Pt is unaware of right visual field deficits and requires Min A during functional mobility and ADL's to assist with compensation techniques, pt requires consistent Min-mod vc's during treatment session today. Pt also with noted difficulty with following commands/task initiation.      Vision Eye Alignment: Within Functional Limits Alignment/Gaze Preference: Gaze left;Other (comment) (Pt is noted to gaze and lean left when supine in bed and when sitting up in chair noted (gazes to midline unelss given moderate vc's to continue turning head to right to track/locate objects in his room)) Ocular Range of Motion: Within Functional Limits   Saccades: Additional eye shifts occurred during testing           Perception Perception Perception Tested?: No   Praxis      Cognition   Behavior  During Therapy: WFL for tasks assessed/performed Overall Cognitive Status: Impaired/Different from baseline Area of Impairment: Orientation;Memory;Safety/judgement;Awareness;Problem solving;Attention Orientation Level: Disoriented to;Situation;Time Current  Attention Level: Selective Memory: Decreased short-term memory;Decreased recall of precautions    Safety/Judgement: Decreased awareness of safety;Decreased awareness of deficits Awareness: Emergent Problem Solving: Slow processing;Difficulty sequencing;Requires verbal cues;Requires tactile cues      Extremity/Trunk Assessment               Exercises     Shoulder Instructions       General Comments      Pertinent Vitals/ Pain       Pain Assessment: No/denies pain  Home Living                                          Prior Functioning/Environment              Frequency Min 2X/week     Progress Toward Goals  OT Goals(current goals can now be found in the care plan section)  Progress towards OT goals: Progressing toward goals     Plan Discharge plan remains appropriate    Co-evaluation                 End of Session Equipment Utilized During Treatment: Gait belt   Activity Tolerance Patient tolerated treatment well   Patient Left in chair;with call bell/phone within reach;with chair alarm set   Nurse Communication Mobility status;Other (comment) (Pt in chair and ADL's completed)        Time: 1100-1133 OT Time Calculation (min): 33 min  Charges: OT General Charges $OT Visit: 1 Procedure OT Treatments $Self Care/Home Management : 23-37 mins (33 min)  [place], Amy Beth Dixon, OTR/L 05/18/2015, 11:49 AM

## 2015-05-19 ENCOUNTER — Inpatient Hospital Stay (HOSPITAL_COMMUNITY): Payer: Medicare Other | Admitting: Speech Pathology

## 2015-05-19 ENCOUNTER — Inpatient Hospital Stay (HOSPITAL_COMMUNITY): Payer: Medicare Other | Admitting: Physical Therapy

## 2015-05-19 ENCOUNTER — Inpatient Hospital Stay (HOSPITAL_COMMUNITY): Payer: Medicare Other | Admitting: Occupational Therapy

## 2015-05-19 ENCOUNTER — Encounter (HOSPITAL_COMMUNITY): Payer: Self-pay | Admitting: Gastroenterology

## 2015-05-19 DIAGNOSIS — H53461 Homonymous bilateral field defects, right side: Secondary | ICD-10-CM

## 2015-05-19 DIAGNOSIS — R269 Unspecified abnormalities of gait and mobility: Secondary | ICD-10-CM

## 2015-05-19 DIAGNOSIS — I63532 Cerebral infarction due to unspecified occlusion or stenosis of left posterior cerebral artery: Secondary | ICD-10-CM

## 2015-05-19 DIAGNOSIS — Z8719 Personal history of other diseases of the digestive system: Secondary | ICD-10-CM

## 2015-05-19 DIAGNOSIS — I69398 Other sequelae of cerebral infarction: Principal | ICD-10-CM

## 2015-05-19 LAB — COMPREHENSIVE METABOLIC PANEL
ALK PHOS: 36 U/L — AB (ref 38–126)
ALT: 22 U/L (ref 17–63)
ANION GAP: 8 (ref 5–15)
AST: 29 U/L (ref 15–41)
Albumin: 2.7 g/dL — ABNORMAL LOW (ref 3.5–5.0)
BUN: 8 mg/dL (ref 6–20)
CHLORIDE: 103 mmol/L (ref 101–111)
CO2: 24 mmol/L (ref 22–32)
Calcium: 8.1 mg/dL — ABNORMAL LOW (ref 8.9–10.3)
Creatinine, Ser: 0.83 mg/dL (ref 0.61–1.24)
GFR calc Af Amer: >60 mL/min (ref >60)
GFR calc non Af Amer: >60 mL/min (ref >60)
GLUCOSE: 103 mg/dL — AB (ref 65–99)
POTASSIUM: 4.3 mmol/L (ref 3.5–5.1)
Sodium: 135 mmol/L (ref 135–145)
Total Bilirubin: 1.1 mg/dL (ref 0.3–1.2)
Total Protein: 5.8 g/dL — ABNORMAL LOW (ref 6.5–8.1)

## 2015-05-19 LAB — CBC WITH DIFFERENTIAL/PLATELET
Basophils Absolute: 0.1 10*3/uL (ref 0.0–0.1)
Basophils Relative: 1 %
Eosinophils Absolute: 0.6 10*3/uL (ref 0.0–0.7)
Eosinophils Relative: 5 %
HEMATOCRIT: 26.9 % — AB (ref 39.0–52.0)
HEMOGLOBIN: 8.9 g/dL — AB (ref 13.0–17.0)
LYMPHS ABS: 2.9 10*3/uL (ref 0.7–4.0)
LYMPHS PCT: 25 %
MCH: 31 pg (ref 26.0–34.0)
MCHC: 33.1 g/dL (ref 30.0–36.0)
MCV: 93.7 fL (ref 78.0–100.0)
MONO ABS: 1 10*3/uL (ref 0.1–1.0)
MONOS PCT: 9 %
NEUTROS ABS: 6.9 10*3/uL (ref 1.7–7.7)
Neutrophils Relative %: 60 %
Platelets: 321 10*3/uL (ref 150–400)
RBC: 2.87 MIL/uL — ABNORMAL LOW (ref 4.22–5.81)
RDW: 13.2 % (ref 11.5–15.5)
WBC: 11.5 10*3/uL — ABNORMAL HIGH (ref 4.0–10.5)

## 2015-05-19 NOTE — Progress Notes (Signed)
Patient information reviewed and entered into eRehab system by Dyanara Cozza, RN, CRRN, PPS Coordinator.  Information including medical coding and functional independence measure will be reviewed and updated through discharge.     Per nursing patient was given "Data Collection Information Summary for Patients in Inpatient Rehabilitation Facilities with attached "Privacy Act Statement-Health Care Records" upon admission.  

## 2015-05-19 NOTE — Progress Notes (Signed)
69 y.o. RH-male with history of HTN, prediabetes, CAD with ICM s/p cath with stenting for STEMI on 05/11/15. He was discharged to home on 10/09 am and developed sudden onset of confusion with right visual field deficits after lunch.  He was re-admitted for work up and CCT negative for acute changes.  NIHSS 4 and he was treated with IV tPA due to suspicion of L-PCA infarct.  He developed LGIB with multiple episodes of  rectal bleeding with clots post tPA. He had drop in H/H and was treated with FFP but continued to have bleeding episodes.  Dr. Cristina Gong was consulted for input and recommended supportive care with serial monitoring of H/H with transfusion for next 1-2 days and bleeding scan for work up in case of medical instability.   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  Dr. Erlinda Hong felt that patient with cardioembolic  L-PCA infarct due to low EF and recent MI.  Subjective/Complaints: Patient slept okay last night. No issues with PT this morning. Wife and daughter are in the room. Continues have severe visual problems toward the right  Review of systems denies abdominal pain nausea vomiting or blood in stools, no breathing issues Objective: Vital Signs: Blood pressure 113/58, pulse 83, temperature 99.4 F (37.4 C), temperature source Oral, resp. rate 18, SpO2 96 %. No results found. Results for orders placed or performed during the hospital encounter of 05/18/15 (from the past 72 hour(s))  Comprehensive metabolic panel     Status: Abnormal   Collection Time: 05/19/15  5:14 AM  Result Value Ref Range   Sodium 135 135 - 145 mmol/L   Potassium 4.3 3.5 - 5.1 mmol/L   Chloride 103 101 - 111 mmol/L   CO2 24 22 - 32 mmol/L   Glucose, Bld 103 (H) 65 - 99 mg/dL   BUN 8 6 - 20 mg/dL   Creatinine, Ser 0.83 0.61 - 1.24 mg/dL   Calcium 8.1 (L) 8.9 - 10.3 mg/dL   Total Protein 5.8 (L) 6.5 - 8.1  g/dL   Albumin 2.7 (L) 3.5 - 5.0 g/dL   AST 29 15 - 41 U/L   ALT 22 17 - 63 U/L   Alkaline Phosphatase 36 (L) 38 - 126 U/L   Total Bilirubin 1.1 0.3 - 1.2 mg/dL   GFR calc non Af Amer >60 >60 mL/min   GFR calc Af Amer >60 >60 mL/min    Comment: (NOTE) The eGFR has been calculated using the CKD EPI equation. This calculation has not been validated in all clinical situations. eGFR's persistently <60 mL/min signify possible Chronic Kidney Disease.    Anion gap 8 5 - 15  CBC WITH DIFFERENTIAL     Status: Abnormal   Collection Time: 05/19/15  5:14 AM  Result Value Ref Range   WBC 11.5 (H) 4.0 - 10.5 K/uL   RBC 2.87 (L) 4.22 - 5.81 MIL/uL   Hemoglobin 8.9 (L) 13.0 - 17.0 g/dL   HCT 26.9 (L) 39.0 - 52.0 %   MCV 93.7 78.0 - 100.0 fL   MCH 31.0 26.0 - 34.0 pg   MCHC 33.1 30.0 - 36.0 g/dL   RDW 13.2 11.5 - 15.5 %   Platelets 321 150 - 400 K/uL   Neutrophils Relative % 60 %   Neutro Abs 6.9 1.7 - 7.7 K/uL   Lymphocytes Relative 25 %   Lymphs Abs 2.9 0.7 - 4.0 K/uL  Monocytes Relative 9 %   Monocytes Absolute 1.0 0.1 - 1.0 K/uL   Eosinophils Relative 5 %   Eosinophils Absolute 0.6 0.0 - 0.7 K/uL   Basophils Relative 1 %   Basophils Absolute 0.1 0.0 - 0.1 K/uL     HEENT: normal Cardio: RRR and no murmurs Resp: CTA B/L and unlabored GI: BS positive and nontender nondistended Extremity:  Pulses positive and No Edema Skin:   Other IV sites without erythema or tenderness right forearm does have bruising right forearm Neuro: Alert/Oriented, Normal Sensory, Normal Motor and Other right homonymous hemianopsia Musc/Skel:  Other no pain with range of motion Gen.  no acute distress   Assessment/Plan: 1. Functional deficits secondary to left occipital CVA which require 3+ hours per day of interdisciplinary therapy in a comprehensive inpatient rehab setting. Physiatrist is providing close team supervision and 24 hour management of active medical problems listed below. Physiatrist and  rehab team continue to assess barriers to discharge/monitor patient progress toward functional and medical goals. FIM:       Function - Toileting Toileting steps completed by patient: Adjust clothing prior to toileting, Adjust clothing after toileting Toileting Assistive Devices: Grab bar or rail Assist level: Supervision or verbal cues                           Medical Problem List and Plan: 1. Functional deficits secondary to left occipital CVA, Gait disturbance related to stroke, right homonymous hemianopsia 2.  DVT Prophylaxis/Anticoagulation: Pharmaceutical: Lovenox 3. Pain Management: Tylenol as needed 4. Mood: Monitor for signs of depression, neuro psych consult if needed 5. Neuropsych: This patient Is capable of making decisions on His own behalf. 6. Skin/Wound Care: Monitor for signs of skin breakdown 7. Fluids/Electrolytes/Nutrition: Intake and output, nutritional supplements as needed 8. CAD s/p STEMI: On ASA,Plavix 9.  LGIB:Will need follow-up with GI as an outpatient, we will monitor serial CBCs as well as stools, stable at 8.9 today 10. HTN:Was on Coreg, Lasix, Prinivil at home will resume as blood pressure allows, restarted coreg on 10/13   LOS (Days) 1 A FACE TO FACE EVALUATION WAS PERFORMED  Sebert Stollings E 05/19/2015, 8:04 AM

## 2015-05-19 NOTE — Evaluation (Signed)
Speech Language Pathology Assessment and Plan  Patient Details  Name: Damon Walker MRN: [id number] Date of Birth: 1946-04-29  SLP Diagnosis: Cognitive Impairments  Rehab Potential: Good ELOS: 7-10 days     Today's Date: 05/19/2015 SLP Individual Time: 1405-1500 SLP Individual Time Calculation (min): 55 min   Problem List:  Patient Active Problem List   Diagnosis Date Noted  . 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 ([place]) 05/18/2015  . Right homonymous hemianopsia 05/18/2015  . Gait disturbance, post-stroke 05/18/2015  . Acute left PCA stroke ([place]) 05/18/2015  . Coronary artery disease involving native coronary artery of native heart without angina pectoris 05/16/2015  . ST elevation myocardial infarction (STEMI) of anterior wall, subsequent episode of care ([place]) 05/15/2015  . Lower GI bleed 05/15/2015  . Stroke with cerebral ischemia ([place]) 05/14/2015  . Carotid arterial disease ([place]) 05/11/2015  . ST elevation (STEMI) myocardial infarction involving left anterior descending coronary artery ([place]) 05/11/2015  . Presence of drug coated stent in LAD coronary artery 05/08/2015  . HTN (hypertension) 07/05/2014  . HLD (hyperlipidemia) 07/05/2014  . Tobacco abuse 07/05/2014  . Pre-syncope 06/22/2014   Past Medical History:  Past Medical History  Diagnosis Date  . Untreated Hypertension   . Tobacco abuse     a. 30 yrs - 1.5 ppd.  Marland Kitchen HLD (hyperlipidemia)   . Carotid arterial disease ([place])   . CAD (coronary artery disease)     Cath 05/11/2015 three-vessel CAD with 90% prox RCA, 80% mid RCA, 80% OM3, 100% prox LAD occlusion treated with DESx2. RCA and OM residual treated medically for now, however will need PCI if has recurrent CP  . Prediabetes     A1C 6.0 in Oct 2016  . Ischemic cardiomyopathy     EF 40% on cath 05/11/2015 after anterior STEMI, EF 20-25% on echo 05/13/2015   Past Surgical History:  Past Surgical History  Procedure Laterality Date  . S/p  appendectomy      Age 48  . S/p inguinal hernia repair      In his 20's.  . Cardiac catheterization N/A 05/11/2015    Procedure: Left Heart Cath and Coronary Angiography;  Surgeon: Peter M Martinique, MD;  Location: [place] [facility];  Service: Cardiovascular;  Laterality: N/A;  . Cardiac catheterization N/A 05/11/2015    Procedure: Coronary Stent Intervention;  Surgeon: Peter M Martinique, MD;  Location: [facility];  Service: Cardiovascular;  Laterality: N/A;  . Flexible sigmoidoscopy N/A 05/18/2015    Procedure: FLEXIBLE SIGMOIDOSCOPY;  Surgeon: Clarene Essex, MD;  Location: [facility];  Service: Endoscopy;  Laterality: N/A;    Assessment / Plan / Recommendation Clinical Impression   Damon Walker is a 69 y.o. RH-male with history of HTN, prediabetes, CAD with ICM s/p cath with stenting for STEMI on 05/11/15. He was discharged to home on 10/09 am and developed sudden onset of confusion with right visual field deficits after lunch. 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 Pt admitted to CIR on 05/18/2015.  SLP evaluation completed on 05/19/2015 with the following results:  Pt presents with mild-moderate cognitive deficits characterized by decreased selective attention to tasks, decreased intellectual/emergent awareness of deficits, decreased recall of new information and short term memory for storage and recall of information, and decreased functional problem solving.  Pt also presents with right inattention and decreased safety awareness resulting from poor insight into his current deficits.  Pt was independent prior to admission and, as a result, would benefit from skilled ST while inpatient in order to maximize functional independence and reduce burden of care prior to discharge.  Anticipate that pt will need 24/7 supervision in addition to follow up ST services at next level of  care and assistance for medication and financial management.      Skilled Therapeutic Interventions          Cognitive-linguistic evaluation completed with results and recommendations reviewed with patient and family. SLP provided skilled education regarding plan of care and goals to be addressed in therapy while inpatient and realistic prognostic expectations for recovery.  Pt and family aware of SLP's recommendation for 24/7 supervision at discharge and plan to have this arranged between various family members and friends.  All questions were answered to their satisfaction at this time.      SLP Assessment  Patient will need skilled [place] [facility] Services during CIR admission    Recommendations  Recommendations for Other Services: Neuropsych consult Patient destination: Home Follow up Recommendations: Home Health SLP;Outpatient SLP;24 hour supervision/assistance Equipment Recommended: None recommended by SLP    SLP Frequency 3 to 5 out of 7 days   SLP Treatment/Interventions Cognitive remediation/compensation;Cueing hierarchy;Environmental controls;Internal/external aids;Functional tasks;Patient/family education   Pain Pain Assessment Pain Assessment: No/denies pain Prior Functioning Cognitive/Linguistic Baseline: Within functional limits Type of Home: House  Lives With: Spouse Available Help at Discharge: Family Vocation: Retired  Function:  Eating Eating                 Cognition Comprehension Comprehension assist level: Understands basic 90% of the time/cues < 10% of the time  Expression   Expression assist level: Expresses basic needs/ideas: With extra time/assistive device  Social Interaction Social Interaction assist level: Interacts appropriately 75 - 89% of the time - Needs redirection for appropriate language or to initiate interaction.  Problem Solving Problem solving assist level: Solves basic 50 - 74% of the time/requires cueing 25 - 49% of the  time  Memory Memory assist level: Recognizes or recalls 50 - 74% of the time/requires cueing 25 - 49% of the time   Short Term Goals: Week 1: SLP Short Term Goal 1 (Week 1): STG=LTG due to ELOS   Refer to Care Plan for Long Term Goals  Recommendations for other services: Neuropsych  Discharge Criteria: Patient will be discharged from SLP if patient refuses treatment 3 consecutive times without medical reason, if treatment goals not met, if there is a change in medical status, if patient makes no progress towards goals or if patient is discharged from hospital.  The above assessment, treatment plan, treatment alternatives and goals were discussed and mutually agreed upon: by patient and by family  Emilio Math 05/19/2015, 4:15 PM

## 2015-05-19 NOTE — Progress Notes (Signed)
Occupational Therapy Assessment and Plan  Patient Details  Name: Damon Walker MRN: [id number] Date of Birth: 1946/01/27  OT Diagnosis: disturbance of vision and muscle weakness (generalized) Rehab Potential: Rehab Potential (ACUTE ONLY): Good ELOS: 7-10 days   Today's Date: 05/19/2015 OT Individual Time:  -   0930-1100  (90 min)       Problem List:  Patient Active Problem List   Diagnosis Date Noted  . 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 ([place]) 05/18/2015  . Right homonymous hemianopsia 05/18/2015  . Gait disturbance, post-stroke 05/18/2015  . Acute left PCA stroke ([place]) 05/18/2015  . Coronary artery disease involving native coronary artery of native heart without angina pectoris 05/16/2015  . ST elevation myocardial infarction (STEMI) of anterior wall, subsequent episode of care ([place]) 05/15/2015  . Lower GI bleed 05/15/2015  . Stroke with cerebral ischemia ([place]) 05/14/2015  . Carotid arterial disease ([place]) 05/11/2015  . ST elevation (STEMI) myocardial infarction involving left anterior descending coronary artery ([place]) 05/11/2015  . Presence of drug coated stent in LAD coronary artery 05/08/2015  . HTN (hypertension) 07/05/2014  . HLD (hyperlipidemia) 07/05/2014  . Tobacco abuse 07/05/2014  . Pre-syncope 06/22/2014    Past Medical History:  Past Medical History  Diagnosis Date  . Untreated Hypertension   . Tobacco abuse     a. 30 yrs - 1.5 ppd.  Marland Kitchen HLD (hyperlipidemia)   . Carotid arterial disease ([place])   . CAD (coronary artery disease)     Cath 05/11/2015 three-vessel CAD with 90% prox RCA, 80% mid RCA, 80% OM3, 100% prox LAD occlusion treated with DESx2. RCA and OM residual treated medically for now, however will need PCI if has recurrent CP  . Prediabetes     A1C 6.0 in Oct 2016  . Ischemic cardiomyopathy     EF 40% on cath 05/11/2015 after anterior STEMI, EF 20-25% on echo 05/13/2015   Past Surgical History:  Past Surgical History   Procedure Laterality Date  . S/p appendectomy      Age 69  . S/p inguinal hernia repair      In his 20's.  . Cardiac catheterization N/A 05/11/2015    Procedure: Left Heart Cath and Coronary Angiography;  Surgeon: Peter M Martinique, MD;  Location: [facility];  Service: Cardiovascular;  Laterality: N/A;  . Cardiac catheterization N/A 05/11/2015    Procedure: Coronary Stent Intervention;  Surgeon: Peter M Martinique, MD;  Location: [place];  Service: Cardiovascular;  Laterality: N/A;  . Flexible sigmoidoscopy N/A 05/18/2015    Procedure: FLEXIBLE SIGMOIDOSCOPY;  Surgeon: Clarene Essex, MD;  [facility] END[facility]COPY;  Service: Endoscopy;  Laterality: N/A;    Assessment & Plan Clinical Impression: Patient is a 69 y.o. year old male with recent admission to the hospital  recent admit 10/6 - 10/9 for MI.   pt presents with L PCA Infarct, GIB,  pt with hx of HTN and CAD.     Patient transferred to CIR on 05/18/2015           Patient currently requires min with basic self-care skills secondary to muscle weakness, decreased coordination, decreased visual acuity, decreased visual perceptual skills, decreased visual motor skills, field cut and hemianopsia, decreased attention to right, decreased attention, decreased awareness, decreased problem solving, decreased safety awareness, decreased memory and delayed processing and decreased standing balance and decreased balance strategies.  Prior to hospitalization, patient could complete BADL with independent .  Patient will benefit from skilled intervention to increase independence  with basic self-care skills and increase level of independence with iADL prior to discharge home with care partner.  Anticipate patient will require 24 hour supervision and follow up outpatient.  OT - End of Session Activity Tolerance: Tolerates 30+ min activity with multiple rests Endurance Deficit: Yes Endurance Deficit Description: requires frequent rests OT  Assessment Rehab Potential (ACUTE ONLY): Good Barriers to Discharge: Other (comment) (right HH) OT Plan OT Intensity: Minimum of 1-2 x/day, 45 to 90 minutes OT Frequency: 5 out of 7 days OT Duration/Estimated Length of Stay: 7-10 days OT Treatment/Interventions: Balance/vestibular training;Discharge planning;Community reintegration;DME/adaptive equipment instruction;Functional mobility training;Neuromuscular re-education;Patient/family education;Self Care/advanced ADL retraining;Therapeutic Activities;Therapeutic Exercise;UE/LE Strength taining/ROM;UE/LE Coordination activities;Visual/perceptual remediation/compensation OT Recommendation Patient destination: Home Follow Up Recommendations: Outpatient OT Equipment Recommended: To be determined   Skilled Therapeutic Intervention Pt, wife, and daughter in room.  Pt slow to process and difficulty with short term memory.  Explained OT POC, and purpose.   Ppt exhibits right HH and needed increased time to locate items on right.   OT Evaluation Precautions/Restrictions  Precautions Precautions: Fall Precaution Comments: R visual field deficits. Restrictions Weight Bearing Restrictions: No      Pain  none   Home Living/Prior Functioning Home Living Family/patient expects to be discharged to:: Private residence Living Arrangements: Spouse/significant other Available Help at Discharge: Family Type of Home: House Home Access: Level entry Home Layout: Multi-level Alternate Level Stairs-Number of Steps: 5 Alternate Level Stairs-Rails: Right Bathroom Shower/Tub: Gaffer, Door [employer] Toilet: Standard Bathroom Accessibility: Yes  Lives With: Spouse IADL History Homemaking Responsibilities: Yes Meal Prep Responsibility: Primary Laundry Responsibility: Primary Cleaning Responsibility: Primary Bill Paying/Finance Responsibility: Primary Shopping Responsibility: Primary Child Care Responsibility: No Homemaking Comments: pt  does house duties while wife works 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 enjoys yard work and has been remodeling  the basement (He installed 40 windows in the home)    Vision/Perception  Vision- History Baseline Vision/History:  (Right [place]) Patient Visual Report: Blurring of vision;Peripheral vision impairment Vision- Assessment Vision Assessment?: Yes Eye Alignment: Within Functional Limits Ocular Range of Motion: Restricted looking up Alignment/Gaze Preference: Gaze left;Other (comment) Tracking/Visual Pursuits: Decreased smoothness of horizontal tracking;Decreased smoothness of vertical tracking Saccades: Additional eye shifts occurred during testing Convergence: Within functional limits Visual Fields: Right homonymous hemianopsia Depth Perception: Undershoots Perception Perception: Impaired Spatial Orientation: miled spatial orientation with line drawing and draw a clock Praxis Praxis: Intact  Cognition Overall Cognitive Status: Impaired/Different from baseline Arousal/Alertness: Awake/alert Orientation Level: Person;Situation;Place Person: Oriented Place: Oriented Situation: Oriented Year: 2016 Month: October Day of Week: Correct Memory: Impaired Memory Recall: Sock;Blue;Bed Memory Recall Sock: With Cue Memory Recall Blue: With Cue Memory Recall Bed: Without Cue Attention: Sustained Sustained Attention: Appears intact Awareness: Impaired Awareness Impairment: Intellectual impairment;Other (comment) (slow ro respond) Problem Solving: Impaired Problem Solving Impairment: Verbal complex;Functional complex Safety/Judgment:  Impaired Sensation Sensation Light Touch: Appears Intact Coordination Gross Motor Movements are Fluid and Coordinated: No Fine Motor Movements are Fluid and Coordinated: Yes Coordination and Movement Description: narrow BOS in gait, difficulty with reciprocal movements Motor  Motor Motor - Skilled Clinical Observations: generalized weakness, especially of core muscles Mobility :  Ambulated with HHA to bathroom and engaged in shower transfer.    Trunk/Postural Assessment  Cervical Assessment Cervical  Assessment: Within Functional Limits Thoracic Assessment Thoracic Assessment: Within Functional Limits Lumbar Assessment Lumbar Assessment: Within Functional Limits Postural Control Postural Control: Deficits on evaluation Righting Reactions: delayed  Balance Balance Balance Assessed: Yes Dynamic Sitting Balance Dynamic Sitting - Balance Activities: Reaching for objects;Reaching across midline;Lateral lean/weight shifting Dynamic Standing Balance Dynamic Standing - Level of Assistance: 4: Min assist Extremity/Trunk Assessment RUE Assessment RUE Assessment: Within Functional Limits LUE Assessment LUE Assessment: Within Functional Limits   See Function Navigator for Current Functional Status.   Refer to Care Plan for Long Term Goals  Recommendations for other services: None  Discharge Criteria: Patient will be discharged from OT if patient refuses treatment 3 consecutive times without medical reason, if treatment goals not met, if there is a change in medical status, if patient makes no progress towards goals or if patient is discharged from hospital.  The above assessment, treatment plan, treatment alternatives and goals were discussed and mutually agreed upon: by patient  Lisa Roca 05/19/2015, 3:02 PM

## 2015-05-19 NOTE — Evaluation (Signed)
Physical Therapy Assessment and Plan  Patient Details  Name: Damon Walker MRN: [id number] Date of Birth: 1945-10-21  PT Diagnosis: Abnormality of gait, Cognitive deficits, Difficulty walking and Muscle weakness Rehab Potential: Good ELOS: 7-10 days   Today's Date: 05/19/2015 PT Individual Time: 0730-0830 PT Individual Time Calculation (min): 60 min    Problem List:  Patient Active Problem List   Diagnosis Date Noted  . 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 Right homonymous hemianopsia 05/18/2015  . Gait disturbance, post-stroke 05/18/2015  . Acute left PCA stroke ([place]) 05/18/2015  . Coronary artery disease involving native coronary artery of native heart without angina pectoris 05/16/2015  . ST elevation myocardial infarction (STEMI) of anterior wall, subsequent episode of care ([place]) 05/15/2015  . Lower GI bleed 05/15/2015  . Stroke with cerebral ischemia ([place]) 05/14/2015  . Carotid arterial disease ([place]) 05/11/2015  . ST elevation (STEMI) myocardial infarction involving left anterior descending coronary artery ([place]) 05/11/2015  . Presence of drug coated stent in LAD coronary artery 05/08/2015  . HTN (hypertension) 07/05/2014  . HLD (hyperlipidemia) 07/05/2014  . Tobacco abuse 07/05/2014  . Pre-syncope 06/22/2014    Past Medical History:  Past Medical History  Diagnosis Date  . Untreated Hypertension   . Tobacco abuse     a. 30 yrs - 1.5 ppd.  Marland Kitchen HLD (hyperlipidemia)   . Carotid arterial disease ([place])   . CAD (coronary artery disease)     Cath 05/11/2015 three-vessel CAD with 90% prox RCA, 80% mid RCA, 80% OM3, 100% prox LAD occlusion treated with DESx2. RCA and OM residual treated medically for now, however will need PCI if has recurrent CP  . Prediabetes     A1C 6.0 in Oct 2016  . Ischemic cardiomyopathy     EF 40% on cath 05/11/2015 after anterior STEMI, EF 20-25% on echo 05/13/2015   Past Surgical History:  Past Surgical  History  Procedure Laterality Date  . S/p appendectomy      Age 2  . S/p inguinal hernia repair      In his 20's.  . Cardiac catheterization N/A 05/11/2015    Procedure: Left Heart Cath and Coronary Angiography;  Surgeon: Peter M Martinique, MD;  Location: [facility];  Service: Cardiovascular;  Laterality: N/A;  . Cardiac catheterization N/A 05/11/2015    Procedure: Coronary Stent Intervention;  Surgeon: Peter M Martinique, MD;  Location: [facility];  Service: Cardiovascular;  Laterality: N/A;    Assessment & Plan Clinical Impression: Patient is a 69 y.o. year old male with recent admission to the hospital for CAD with ICM s/p cath with stenting for STEMI on 05/11/15. He was discharged to home on 10/09 am and developed sudden onset of confusion with right visual field deficits after lunch. He was re-admitted for work up and CCT negative for acute changes.Patient with L PCA infarct.  Patient transferred to CIR on 05/18/2015 .   Patient currently requires min with mobility secondary to muscle weakness, decreased cardiorespiratoy endurance, decreased coordination, decreased awareness and decreased standing balance and decreased balance strategies.  Prior to hospitalization, patient was independent  with mobility and lived with Spouse in a House home.  Home access is  Level entry.  Patient will benefit from skilled PT intervention to maximize safe functional mobility, minimize fall risk and decrease caregiver burden for planned discharge home with intermittent assist.  Anticipate patient will benefit from follow up OP at discharge.  PT - End of Session  Activity Tolerance: Tolerates 30+ min activity with multiple rests Endurance Deficit: Yes Endurance Deficit Description: requires frequent rests PT Assessment Rehab Potential (ACUTE/IP ONLY): Good PT Patient demonstrates impairments in the following area(s): Balance;Endurance;Motor;Safety PT Transfers Functional Problem(s): Bed to  Chair;Car;Furniture;Floor PT Locomotion Functional Problem(s): Ambulation;Stairs PT Plan PT Intensity: Minimum of 1-2 x/day ,45 to 90 minutes PT Frequency: 5 out of 7 days PT Duration Estimated Length of Stay: 7-10 days PT Treatment/Interventions: Ambulation/gait training;Discharge planning;Functional mobility training;Therapeutic Activities;Visual/perceptual remediation/compensation;Therapeutic Exercise;Neuromuscular re-education;Balance/vestibular training;Cognitive remediation/compensation;DME/adaptive equipment instruction;Pain management;Splinting/orthotics;UE/LE Strength taining/ROM;UE/LE Coordination activities;Stair training;Patient/family education;Community reintegration;Wheelchair propulsion/positioning PT Transfers Anticipated Outcome(s): mod I PT Locomotion Anticipated Outcome(s): mod I PT Recommendation Follow Up Recommendations: Outpatient PT;24 hour supervision/assistance Patient destination: Home Equipment Recommended: To be determined  Skilled Therapeutic Intervention Pt performed gait with HHA multiple attempts up to 150' with min A.  Pt with narrow BOS, toe in bilaterally.  Standing balance and BERG balance test performed.  Pt requires frequent rests due to feeling "worn out".  Car transfer with supervision to simulated truck height.  Pt limited by decreased R vision, pt aware but unable to compensate for deficit.  Ramp and curb training with min HHA.  Bed mobility to simulated height of patient's bed at home with supervision. Pt and wife educated on rehab program, PT goals and POC.  PT Evaluation Precautions/Restrictions Precautions Precautions: Fall Precaution Comments: R visual deficits. Restrictions Weight Bearing Restrictions: No Pain Pain Assessment Pain Assessment: No/denies pain Home Living/Prior Functioning Home Living Available Help at Discharge: Family Type of Home: House Home Access: Level entry Home Layout: Multi-level Alternate Level Stairs-Number of  Steps: 5 Alternate Level Stairs-Rails: Right  Lives With: Spouse Prior Function Level of Independence: Independent with basic ADLs;Independent with transfers;Independent with gait  Able to Take Stairs?: Yes Driving: Yes Vocation: Retired Associate Professor Overall Cognitive Status: Impaired/Different from baseline Arousal/Alertness: Awake/alert Awareness: Impaired Awareness Impairment: Emergent impairment Safety/Judgment: Impaired Sensation Sensation Light Touch: Appears Intact Coordination Gross Motor Movements are Fluid and Coordinated: No Coordination and Movement Description: narrow BOS in gait, difficulty with reciprocal movements Motor  Motor Motor - Skilled Clinical Observations: generalized weakness, especially of core muscles     Trunk/Postural Assessment  Cervical Assessment Cervical Assessment:  (holds head in L rotation) Thoracic Assessment Thoracic Assessment:  (WFL) Lumbar Assessment Lumbar Assessment:  (weakness) Postural Control Postural Control: Deficits on evaluation Righting Reactions: delayed  Balance Standardized Balance Assessment Standardized Balance Assessment: Berg Balance Test Berg Balance Test Sit to Stand: Able to stand without using hands and stabilize independently Standing Unsupported: Able to stand 2 minutes with supervision Sitting with Back Unsupported but Feet Supported on Floor or Stool: Able to sit safely and securely 2 minutes Stand to Sit: Controls descent by using hands Transfers: Able to transfer safely, definite need of hands Standing Unsupported with Eyes Closed: Able to stand 10 seconds with supervision Standing Ubsupported with Feet Together: Needs help to attain position but able to stand for 30 seconds with feet together From Standing, Reach Forward with Outstretched Arm: Reaches forward but needs supervision From Standing Position, Pick up Object from Floor: Able to pick up shoe, needs supervision From Standing Position, Turn to  Look Behind Over each Shoulder: Turn sideways only but maintains balance Turn 360 Degrees: Needs close supervision or verbal cueing Standing Unsupported, Alternately Place Feet on Step/Stool: Able to complete >2 steps/needs minimal assist Standing Unsupported, One Foot in Front: Needs help to step but can hold 15 seconds Standing on One Leg: Unable to try or needs  assist to prevent fall Total Score: 30 Extremity Assessment      RLE Assessment RLE Assessment:  (grossly 3/5) LLE Assessment LLE Assessment:  (grossly 3/5)   See Function Navigator for Current Functional Status.   Refer to Care Plan for Long Term Goals  Recommendations for other services: None  Discharge Criteria: Patient will be discharged from PT if patient refuses treatment 3 consecutive times without medical reason, if treatment goals not met, if there is a change in medical status, if patient makes no progress towards goals or if patient is discharged from hospital.  The above assessment, treatment plan, treatment alternatives and goals were discussed and mutually agreed upon: by patient and by family  Oree Hislop 05/19/2015, 8:42 AM

## 2015-05-20 ENCOUNTER — Inpatient Hospital Stay (HOSPITAL_COMMUNITY): Payer: Medicare Other | Admitting: Speech Pathology

## 2015-05-20 ENCOUNTER — Inpatient Hospital Stay (HOSPITAL_COMMUNITY): Payer: Medicare Other

## 2015-05-20 ENCOUNTER — Inpatient Hospital Stay (HOSPITAL_COMMUNITY): Payer: Medicare Other | Admitting: Physical Therapy

## 2015-05-20 NOTE — Progress Notes (Signed)
69 y.o. RH-male with history of HTN, prediabetes, CAD with ICM s/p cath with stenting for STEMI on 05/11/15. He was discharged to home on 10/09 am and developed sudden onset of confusion with right visual field deficits after lunch.  He was re-admitted for work up and CCT negative for acute changes.  NIHSS 4 and he was treated with IV tPA due to suspicion of L-PCA infarct.  He developed LGIB with multiple episodes of  rectal bleeding with clots post tPA. He had drop in H/H and was treated with FFP but continued to have bleeding episodes.  Dr. Cristina Gong was consulted for input and recommended supportive care with serial monitoring of H/H with transfusion for next 1-2 days and bleeding scan for work up in case of medical instability.   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  Dr. Erlinda Hong felt that patient with cardioembolic  L-PCA infarct due to low EF and recent MI.  Subjective/Complaints: Pt without new issues overnite , watching soccer  Review of systems denies abdominal pain nausea vomiting or blood in stools, no breathing issues Objective: Vital Signs: Blood pressure 115/53, pulse 74, temperature 97.4 F (36.3 C), temperature source Oral, resp. rate 18, SpO2 96 %. No results found. Results for orders placed or performed during the hospital encounter of 05/18/15 (from the past 72 hour(s))  Comprehensive metabolic panel     Status: Abnormal   Collection Time: 05/19/15  5:14 AM  Result Value Ref Range   Sodium 135 135 - 145 mmol/L   Potassium 4.3 3.5 - 5.1 mmol/L   Chloride 103 101 - 111 mmol/L   CO2 24 22 - 32 mmol/L   Glucose, Bld 103 (H) 65 - 99 mg/dL   BUN 8 6 - 20 mg/dL   Creatinine, Ser 0.83 0.61 - 1.24 mg/dL   Calcium 8.1 (L) 8.9 - 10.3 mg/dL   Total Protein 5.8 (L) 6.5 - 8.1 g/dL   Albumin 2.7 (L) 3.5 - 5.0 g/dL   AST 29 15 - 41 U/L   ALT 22 17 - 63 U/L   Alkaline  Phosphatase 36 (L) 38 - 126 U/L   Total Bilirubin 1.1 0.3 - 1.2 mg/dL   GFR calc non Af Amer >60 >60 mL/min   GFR calc Af Amer >60 >60 mL/min    Comment: (NOTE) The eGFR has been calculated using the CKD EPI equation. This calculation has not been validated in all clinical situations. eGFR's persistently <60 mL/min signify possible Chronic Kidney Disease.    Anion gap 8 5 - 15  CBC WITH DIFFERENTIAL     Status: Abnormal   Collection Time: 05/19/15  5:14 AM  Result Value Ref Range   WBC 11.5 (H) 4.0 - 10.5 K/uL   RBC 2.87 (L) 4.22 - 5.81 MIL/uL   Hemoglobin 8.9 (L) 13.0 - 17.0 g/dL   HCT 26.9 (L) 39.0 - 52.0 %   MCV 93.7 78.0 - 100.0 fL   MCH 31.0 26.0 - 34.0 pg   MCHC 33.1 30.0 - 36.0 g/dL   RDW 13.2 11.5 - 15.5 %   Platelets 321 150 - 400 K/uL   Neutrophils Relative % 60 %   Neutro Abs 6.9 1.7 - 7.7 K/uL   Lymphocytes Relative 25 %   Lymphs Abs 2.9 0.7 - 4.0 K/uL   Monocytes Relative 9 %   Monocytes Absolute 1.0 0.1 - 1.0 K/uL   Eosinophils Relative  5 %   Eosinophils Absolute 0.6 0.0 - 0.7 K/uL   Basophils Relative 1 %   Basophils Absolute 0.1 0.0 - 0.1 K/uL     HEENT: normal Cardio: RRR and no murmurs Resp: CTA B/L and unlabored GI: BS positive and nontender nondistended Extremity:  Pulses positive and No Edema Skin:   Other IV sites without erythema or tenderness right forearm does have bruising right forearm Neuro: Alert/Oriented, Normal Sensory, Normal Motor and Other right homonymous hemianopsia Musc/Skel:  Other no pain with range of motion Gen.  no acute distress   Assessment/Plan: 1. Functional deficits secondary to left occipital CVA which require 3+ hours per day of interdisciplinary therapy in a comprehensive inpatient rehab setting. Physiatrist is providing close team supervision and 24 hour management of active medical problems listed below. Physiatrist and rehab team continue to assess barriers to discharge/monitor patient progress toward functional and  medical goals. FIM:       Function - Toileting Toileting steps completed by patient: Adjust clothing prior to toileting, Adjust clothing after toileting Toileting Assistive Devices: Grab bar or rail Assist level: Supervision or verbal cues     Function - Chair/bed transfer Chair/bed transfer method: Ambulatory Chair/bed transfer assist level: Touching or steadying assistance (Pt > 75%)  Function - Locomotion: Wheelchair Will patient use wheelchair at discharge?: No Wheelchair activity did not occur: N/A Function - Locomotion: Ambulation Assistive device: No device Max distance: 150 Assist level: Touching or steadying assistance (Pt > 75%) Assist level: Touching or steadying assistance (Pt > 75%) Assist level: Touching or steadying assistance (Pt > 75%) Assist level: Touching or steadying assistance (Pt > 75%) Assist level: Touching or steadying assistance (Pt > 75%)  Function - Comprehension Comprehension: Auditory Comprehension assist level: Understands basic 90% of the time/cues < 10% of the time  Function - Expression Expression: Verbal Expression assist level: Expresses basic needs/ideas: With extra time/assistive device  Function - Social Interaction Social Interaction assist level: Interacts appropriately 75 - 89% of the time - Needs redirection for appropriate language or to initiate interaction.  Function - Problem Solving Problem solving assist level: Solves basic 50 - 74% of the time/requires cueing 25 - 49% of the time  Function - Memory Memory assist level: Recognizes or recalls 50 - 74% of the time/requires cueing 25 - 49% of the time Patient normally able to recall (first 3 days only): Location of own room, Current season, That he or she is in a hospital   Medical Problem List and Plan: 1. Functional deficits secondary to left occipital CVA, Gait disturbance related to stroke, right homonymous hemianopsia 2.  DVT Prophylaxis/Anticoagulation:  Pharmaceutical: Lovenox 3. Pain Management: Tylenol as needed 4. Mood: Monitor for signs of depression, neuro psych consult if needed 5. Neuropsych: This patient Is capable of making decisions on His own behalf. 6. Skin/Wound Care: Monitor for signs of skin breakdown 7. Fluids/Electrolytes/Nutrition: Intake and output, nutritional supplements as needed 8. CAD s/p STEMI: On ASA,Plavix 9.  LGIB:Will need follow-up with GI as an outpatient, we will monitor serial CBCs as well as stools, stable at 8.9 on 10/14 10. HTN:Was on Coreg, Lasix, Prinivil at home will resume as blood pressure allows, restarted coreg on 10/13   LOS (Days) 2 A FACE TO FACE EVALUATION WAS PERFORMED  KIRSTEINS,ANDREW E 05/20/2015, 8:09 AM

## 2015-05-20 NOTE — Progress Notes (Signed)
Speech Language Pathology Daily Session Note  Patient Details  Name: Paco Cislo MRN: [id number] Date of Birth: [birth year]  Today's Date: 05/20/2015 SLP Individual Time: 0211-1735 SLP Individual Time Calculation (min): 45 min  Short Term Goals: Week 1: SLP Short Term Goal 1 (Week 1): STG=LTG due to ELOS   Skilled Therapeutic Interventions: Skilled ST intervention provided with focus on cognitive goals. Pt seen in room at bedisde for ST tx session. Slp modified environment by turning down volume on television to increase pt attention/focus to tx tasks. Pt required 1 verbal cue for redirection throughout 30 minute task. Pt recalled strategies to increase attention to R-side, no cues required. Sequencing tasks completed with 50% accuracy, independent and 90% accuracy, given min verbal/visual cues.    Function:  Eating Eating                 Cognition Comprehension Comprehension assist level: Understands basic 90% of the time/cues < 10% of the time  Expression   Expression assist level: Expresses basic needs/ideas: With extra time/assistive device  Social Interaction Social Interaction assist level: Interacts appropriately 75 - 89% of the time - Needs redirection for appropriate language or to initiate interaction.  Problem Solving Problem solving assist level: Solves basic 50 - 74% of the time/requires cueing 25 - 49% of the time  Memory Memory assist level: Recognizes or recalls 50 - 74% of the time/requires cueing 25 - 49% of the time    Pain Pain Assessment Pain Assessment: No/denies pain  Therapy/Group: Individual Therapy  Kamaron Deskins, Bernerd Pho 05/20/2015, 9:37 AM

## 2015-05-20 NOTE — Progress Notes (Signed)
Physical Therapy Session Note  Patient Details  Name: Damon Walker MRN: [id number] Date of Birth: 02/14/1946  Today's Date: 05/20/2015 PT Individual Time: 1104-1200 PT Individual Time Calculation (min): 56 min   Short Term Goals: Week 1:     Skilled Therapeutic Interventions/Progress Updates:   Pt received sitting in recliner in room, agreeable to therapy session.  Skilled session focused on R NMR, functional endurance and strengthening, negotiation in obstacles courses and scanning R due to inattention and field cut deficits.  Pt ambulated to/from therapy gym x 100' at S level with max verbal and tactile cues for turning to the R.  Once in therapy gym, performed seated nustep x 6 mins at level 3 resistance at 40-50 steps per minute in order to address overall strengthening and endurance as well as attention to the R with maintaining reps per minute.  Min cues to find location on screen.  Then performed Biodex balance activities to address weight shifting, and limits of stability while force place was compliant to further challenge balance.  Note that pt tends to keep weight shifted posteriorly and to the L, therefore worked on anterior and lateral weight shifts with 5 sec holds.  Performed with BUE support>single UE support>no UE support however requires min A without UE support.  Progressed to standing on foam feet apart>feet together scanning R for letters called out by therapist, single letter>double letter>making short words with letters.  Pt with increased difficulty scanning R and inferior as well as needed max verbal cues for making words.  Ended session by leading pt to [facility] with instruction to be able to lead PT back to room.  Pt requires total A for memory recall of room number several times during trip as well as total A cues to utilize signs to locate room.  Pt left in chair in room with family.  Education to family regarding current deficits and compensatory strategies working on  to improve them.  All verbalized understanding.    Therapy Documentation Precautions:  Precautions Precautions: Fall Precaution Comments: R visual field deficits. Restrictions Weight Bearing Restrictions: No   Vital Signs: Therapy Vitals Temp: 97.4 F (36.3 C) Temp Source: Oral Pulse Rate: 74 Resp: 18 BP: (!) 115/53 mmHg Patient Position (if appropriate): Lying Oxygen Therapy SpO2: 96 % O2 Device: Not Delivered Pain: Pt reports no pain  See Function Navigator for Current Functional Status.   Therapy/Group: Individual Therapy  Denice Bors 05/20/2015, 7:59 AM

## 2015-05-20 NOTE — Progress Notes (Signed)
Occupational Therapy Session Note  Patient Details  Name: Damon Walker MRN: [id number] Date of Birth: 01-31-1946  Today's Date: 05/20/2015 OT Individual Time: 1000-1057 and 1330-1400 OT Individual Time Calculation (min): 57 min and 30 min    Short Term Goals: Week 1:  OT Short Term Goal 1 (Week 1): STG= LTG  Skilled Therapeutic Interventions/Progress Updates:    Session 1: Pt seen for 1:1 OT session with focus on functional mobility, safety awareness, visual scanning, and activity tolerance. Pt received sitting in recliner declining B&D today. Ambulated to ADL apartment with CGA-SBA to completed bed making task with focus on dynamic standing balance. Pt completed at supervision level and no rest break. Ambulated in hospital lobby and outside with focus on visual scanning, activity tolerance, and balance. Pt ambulated on uneven surfaces with no LOB and overall SBA. He required mod-max verbal and tactile cues for scanning to right to locate items in environment. Re-educated on compensatory strategies due to visual deficits. Pt returned to unit and engaged in multiple visual scanning tasks in hallway. Pt located number 1-8 on wall with 3 cues for slowing pace and utilizing compensatory strategies. Pt completed obstacle course of ambulating around cones with minimal cues for slowing pace and increasing attention to items on right side. Pt returned to room and left with all needs in reach. Pt fatigued quickly throughout session requiring multiple rest breaks. Therapist educated on breathing techniques during each rest break.   Session 2: OT session focused on functional endurance, functional mobility, and visual scanning. Pt received sitting in chair reporting fatigue however agreeable to therapy. Ambulated to therapy gym with SBA and cues for attention to right side. Completed NuStep workload of 3 for 10 minutes with focus on increasing functional endurance. Pt completed with no rest breaks. Engaged  in ball toss activity in sitting with focus on head turns to right to scan and locate ball. Pt successful caught ball thrown to right of midline 8/10 times. Pt returned to room and left with all needs in reach.   Therapy Documentation Precautions:  Precautions Precautions: Fall Precaution Comments: R visual field deficits. Restrictions Weight Bearing Restrictions: No General:   Vital Signs:  Pain: Pain Assessment Pain Assessment: No/denies pain  See Function Navigator for Current Functional Status.   Therapy/Group: Individual Therapy  Duayne Cal 05/20/2015, 12:07 PM

## 2015-05-21 ENCOUNTER — Inpatient Hospital Stay (HOSPITAL_COMMUNITY): Payer: Medicare Other | Admitting: Physical Therapy

## 2015-05-21 MED ORDER — BENEPROTEIN PO POWD
1.0000 | Freq: Three times a day (TID) | ORAL | Status: DC
  Administered 2015-05-21 – 2015-05-23 (×2): 6 g via ORAL
  Filled 2015-05-21: qty 227

## 2015-05-21 NOTE — Progress Notes (Signed)
Physical Therapy Session Note  Patient Details  Name: Damon Walker MRN: [id number] Date of Birth: 09/23/1945  Today's Date: 05/21/2015 PT Individual Time: 1330-1430 PT Individual Time Calculation (min): 60 min   Short Term Goals: Week 1:     Skilled Therapeutic Interventions/Progress Updates:  Pt was seen bedside in the pm. Pt performed all transfers sit to stand and stand pivot with S and verbal cues. Pt ambulated 125, 25 and 150 feet without assistive and S with verbal cues. Treatment in gym focused on NMR, utilizing step taps and alternating step taps, 3 sets x 10 reps each as well as slalom cones course 50 feet x 2. Pt rode recumbent bike 10 minutes at level 3. Following treatment pt returned to room. Pt performed toileting transfers with S and verbal cues.  Pt left sitting up in bedside chair with family present.   Therapy Documentation Precautions:  Precautions Precautions: Fall Precaution Comments: R visual field deficits. Restrictions Weight Bearing Restrictions: No General:   Pain: No c/o pain.    See Function Navigator for Current Functional Status.   Therapy/Group: Individual Therapy  Dub Amis 05/21/2015, 3:26 PM

## 2015-05-21 NOTE — IPOC Note (Signed)
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)   Right homonymous hemianopsia   Acute left PCA stroke [facility])     Functional Problem List: Nursing Behavior, Bladder, Safety, Bowel, Endurance, Medication Management, Motor, Nutrition, Pain, Sensory, Skin Integrity, Perception  PT Balance, Endurance, Motor, Safety  OT Balance, Cognition, Endurance, Motor, Perception, Safety, Vision  SLP Cognition  TR         Basic ADL's: OT Grooming, Bathing, Dressing, Toileting     Advanced  ADL's: OT Simple Meal Preparation, Laundry, Light Housekeeping     Transfers: PT Bed to Chair, Car, [employer], Futures trader, Tub/Shower     Locomotion: PT Ambulation, Stairs     Additional Impairments: OT Other (comment) (visual deficit)  SLP Social Cognition   Problem Solving, Memory, Attention, Awareness  TR      Anticipated Outcomes Item Anticipated Outcome  Self Feeding independent  Swallowing      Basic self-care  mod I  Toileting  mod I   Bathroom Transfers mod I to toilet and shower stall  Bowel/Bladder  Pt will remain continent of bowel and bladder while on rehab with min assist   Transfers  mod I  Locomotion  mod I  Communication     Cognition  supervision   Pain  Pt will rate pain at 4 or less on a scale of 0-10.   Safety/Judgment  Pt will remain free of falls and inujry while on rehab with min assist    Therapy Plan: PT Intensity: Minimum of 1-2 x/day ,45 to 90 minutes PT Frequency: 5 out of 7 days PT Duration Estimated Length of Stay: 7-10 days OT Intensity: Minimum of 1-2 x/day, 45 to 90 minutes OT Frequency: 5 out of 7 days OT Duration/Estimated Length of Stay: 7-10 days SLP Intensity: Minumum of 1-2 x/day, 30 to  90 minutes SLP Frequency: 3 to 5 out of 7 days SLP Duration/Estimated Length of Stay: 7-10 days        Team Interventions: Nursing Interventions Patient/Family Education, Bladder Management, Bowel Management, Pain Management, Medication Management, Skin Care/Wound Management, Cognitive Remediation/Compensation, Discharge Planning, Disease Management/Prevention, Psychosocial Support  PT interventions Ambulation/gait training, Discharge planning, Functional mobility training, Therapeutic Activities, Visual/perceptual remediation/compensation, Therapeutic Exercise, Neuromuscular re-education, Balance/vestibular training, Cognitive remediation/compensation, DME/adaptive equipment instruction, Pain management, Splinting/orthotics, UE/LE Strength taining/ROM, UE/LE Coordination activities, Stair training, Patient/family education, Academic librarian, Wheelchair propulsion/positioning  OT Interventions Training and development officer, Discharge planning, [employer] reintegration, Engineer, drilling, Functional mobility training, Neuromuscular re-education, Patient/family education, Self Care/advanced ADL retraining, Therapeutic Activities, Therapeutic Exercise, UE/LE Strength taining/ROM, UE/LE Coordination activities, Visual/perceptual remediation/compensation  SLP Interventions Cognitive remediation/compensation, English as a second language teacher, Environmental controls, Internal/external aids, Functional tasks, Patient/family education  TR Interventions    SW/CM Interventions Discharge Planning, Psychosocial Support, Patient/Family Education    Team Discharge Planning: Destination: PT-Home ,OT- Home , SLP-Home Projected Follow-up: PT-Outpatient PT, 24 hour supervision/assistance, OT-  Outpatient OT, SLP-Home Health SLP, Outpatient SLP, 24 hour supervision/assistance Projected Equipment Needs: PT-To be determined, OT- To be determined, SLP-None recommended by SLP Equipment Details: PT- , OT-   Patient/family involved in discharge planning: PT- Patient, Family member/caregiver,  OT-Patient, Family member/caregiver, SLP-Family member/caregiver  MD ELOS: 7-10d Medical Rehab Prognosis:  Good Assessment: 69 y.o. RH-male with history of HTN, prediabetes, CAD with ICM s/p cath with stenting for STEMI  on 05/11/15. He was discharged to home on 10/09 am and developed sudden onset of confusion with right visual field deficits after lunch. He was re-admitted for work up and CCT negative for acute changes. NIHSS 4 and he was treated with IV tPA due to suspicion of L-PCA infarct. He developed LGIB with multiple episodes of rectal bleeding with clots post tPA. He had drop in H/H and was treated with FFP but continued to have bleeding episodes. Dr. Cristina Gong was consulted for input and recommended supportive care with serial monitoring of H/H with transfusion for next 1-2 days and bleeding scan for work up in case of medical instability.  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 Dr. Erlinda Hong felt that patient with cardioembolic L-PCA infarct due to low EF and recent MI.   Now requiring 24/7 Rehab RN,MD, as well as CIR level PT, OT and SLP.  Treatment team will focus on ADLs and mobility with goals set at Mod I See Team Conference Notes for weekly updates to the plan of care

## 2015-05-21 NOTE — Progress Notes (Signed)
69 y.o. RH-male with history of HTN, prediabetes, CAD with ICM s/p cath with stenting for STEMI on 05/11/15. He was discharged to home on 10/09 am and developed sudden onset of confusion with right visual field deficits after lunch.  He was re-admitted for work up and CCT negative for acute changes.  NIHSS 4 and he was treated with IV tPA due to suspicion of L-PCA infarct.  He developed LGIB with multiple episodes of  rectal bleeding with clots post tPA. He had drop in H/H and was treated with FFP but continued to have bleeding episodes.  Dr. Cristina Gong was consulted for input and recommended supportive care with serial monitoring of H/H with transfusion for next 1-2 days and bleeding scan for work up in case of medical instability.   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  Dr. Erlinda Hong felt that patient with cardioembolic  L-PCA infarct due to low EF and recent MI.  Subjective/Complaints: Pt without new issues overnite , watching soccer  Review of systems denies abdominal pain nausea vomiting or blood in stools, no breathing issues Objective: Vital Signs: Blood pressure 115/58, pulse 87, temperature 98.7 F (37.1 C), temperature source Oral, resp. rate 18, SpO2 95 %. No results found. Results for orders placed or performed during the hospital encounter of 05/18/15 (from the past 72 hour(s))  Comprehensive metabolic panel     Status: Abnormal   Collection Time: 05/19/15  5:14 AM  Result Value Ref Range   Sodium 135 135 - 145 mmol/L   Potassium 4.3 3.5 - 5.1 mmol/L   Chloride 103 101 - 111 mmol/L   CO2 24 22 - 32 mmol/L   Glucose, Bld 103 (H) 65 - 99 mg/dL   BUN 8 6 - 20 mg/dL   Creatinine, Ser 0.83 0.61 - 1.24 mg/dL   Calcium 8.1 (L) 8.9 - 10.3 mg/dL   Total Protein 5.8 (L) 6.5 - 8.1 g/dL   Albumin 2.7 (L) 3.5 - 5.0 g/dL   AST 29 15 - 41 U/L   ALT 22 17 - 63 U/L   Alkaline  Phosphatase 36 (L) 38 - 126 U/L   Total Bilirubin 1.1 0.3 - 1.2 mg/dL   GFR calc non Af Amer >60 >60 mL/min   GFR calc Af Amer >60 >60 mL/min    Comment: (NOTE) The eGFR has been calculated using the CKD EPI equation. This calculation has not been validated in all clinical situations. eGFR's persistently <60 mL/min signify possible Chronic Kidney Disease.    Anion gap 8 5 - 15  CBC WITH DIFFERENTIAL     Status: Abnormal   Collection Time: 05/19/15  5:14 AM  Result Value Ref Range   WBC 11.5 (H) 4.0 - 10.5 K/uL   RBC 2.87 (L) 4.22 - 5.81 MIL/uL   Hemoglobin 8.9 (L) 13.0 - 17.0 g/dL   HCT 26.9 (L) 39.0 - 52.0 %   MCV 93.7 78.0 - 100.0 fL   MCH 31.0 26.0 - 34.0 pg   MCHC 33.1 30.0 - 36.0 g/dL   RDW 13.2 11.5 - 15.5 %   Platelets 321 150 - 400 K/uL   Neutrophils Relative % 60 %   Neutro Abs 6.9 1.7 - 7.7 K/uL   Lymphocytes Relative 25 %   Lymphs Abs 2.9 0.7 - 4.0 K/uL   Monocytes Relative 9 %   Monocytes Absolute 1.0 0.1 - 1.0 K/uL   Eosinophils Relative  5 %   Eosinophils Absolute 0.6 0.0 - 0.7 K/uL   Basophils Relative 1 %   Basophils Absolute 0.1 0.0 - 0.1 K/uL     HEENT: normal Cardio: RRR and no murmurs Resp: CTA B/L and unlabored GI: BS positive and nontender nondistended Extremity:  Pulses positive and No Edema Skin:   Other IV sites without erythema or tenderness right forearm does have bruising right forearm Neuro: Alert/Oriented, Normal Sensory, Normal Motor and Other right homonymous hemianopsia Musc/Skel:  Other no pain with range of motion Gen.  no acute distress   Assessment/Plan: 1. Functional deficits secondary to left occipital CVA with Right homonymous hemianopsia which require 3+ hours per day of interdisciplinary therapy in a comprehensive inpatient rehab setting. Physiatrist is providing close team supervision and 24 hour management of active medical problems listed below. Physiatrist and rehab team continue to assess barriers to discharge/monitor  patient progress toward functional and medical goals. FIM:       Function - Toileting Toileting steps completed by patient: Adjust clothing prior to toileting, Adjust clothing after toileting Toileting Assistive Devices: Grab bar or rail Assist level: Supervision or verbal cues     Function - Chair/bed transfer Chair/bed transfer method: Ambulatory Chair/bed transfer assist level: Supervision or verbal cues Chair/bed transfer details: Verbal cues for precautions/safety  Function - Locomotion: Wheelchair Will patient use wheelchair at discharge?: No Wheelchair activity did not occur: N/A Function - Locomotion: Ambulation Assistive device: No device Max distance: 200 Assist level: Supervision or verbal cues Assist level: Supervision or verbal cues Assist level: Supervision or verbal cues Assist level: Supervision or verbal cues Assist level: Touching or steadying assistance (Pt > 75%)  Function - Comprehension Comprehension: Auditory Comprehension assist level: Understands basic 90% of the time/cues < 10% of the time  Function - Expression Expression: Verbal Expression assist level: Expresses basic needs/ideas: With extra time/assistive device  Function - Social Interaction Social Interaction assist level: Interacts appropriately 75 - 89% of the time - Needs redirection for appropriate language or to initiate interaction.  Function - Problem Solving Problem solving assist level: Solves basic 50 - 74% of the time/requires cueing 25 - 49% of the time  Function - Memory Memory assist level: Recognizes or recalls 50 - 74% of the time/requires cueing 25 - 49% of the time Patient normally able to recall (first 3 days only): Location of own room, Current season, That he or she is in a hospital   Medical Problem List and Plan: 1. Functional deficits secondary to left occipital CVA, Gait disturbance related to stroke, right homonymous hemianopsia 2.  DVT  Prophylaxis/Anticoagulation: Pharmaceutical: Lovenox 3. Pain Management: Tylenol as needed 4. Mood: Monitor for signs of depression, neuro psych consult if needed 5. Neuropsych: This patient Is capable of making decisions on His own behalf. 6. Skin/Wound Care: Monitor for signs of skin breakdown 7. Fluids/Electrolytes/Nutrition: Intake and output, nutritional supplements as needed 8. CAD s/p STEMI: On ASA,Plavix 9.  LGIB:Will need follow-up with GI as an outpatient, we will monitor serial CBCs as well as stools, stable at 8.9 on 10/14 10. HTN:Was on Coreg, Lasix, Prinivil at home will resume as blood pressure allows, restarted coreg on 10/13, HR and BP ok 11.  Hypoalbuminemia- start beneprotein  LOS (Days) 3 A FACE TO FACE EVALUATION WAS PERFORMED  KIRSTEINS,ANDREW E 05/21/2015, 8:51 AM

## 2015-05-22 ENCOUNTER — Inpatient Hospital Stay (HOSPITAL_COMMUNITY): Payer: Medicare Other | Admitting: Occupational Therapy

## 2015-05-22 ENCOUNTER — Inpatient Hospital Stay (HOSPITAL_COMMUNITY): Payer: Medicare Other | Admitting: Speech Pathology

## 2015-05-22 ENCOUNTER — Inpatient Hospital Stay (HOSPITAL_COMMUNITY): Payer: Medicare Other

## 2015-05-22 ENCOUNTER — Ambulatory Visit: Payer: Medicare Other | Admitting: Cardiology

## 2015-05-22 DIAGNOSIS — I69312 Visuospatial deficit and spatial neglect following cerebral infarction: Secondary | ICD-10-CM

## 2015-05-22 NOTE — Progress Notes (Signed)
69 y.o. RH-male with history of HTN, prediabetes, CAD with ICM s/p cath with stenting for STEMI on 05/11/15. He was discharged to home on 10/09 am and developed sudden onset of confusion with right visual field deficits after lunch.  He was re-admitted for work up and CCT negative for acute changes.  NIHSS 4 and he was treated with IV tPA due to suspicion of L-PCA infarct.  He developed LGIB with multiple episodes of  rectal bleeding with clots post tPA. He had drop in H/H and was treated with FFP but continued to have bleeding episodes.  Dr. Cristina Gong was consulted for input and recommended supportive care with serial monitoring of H/H with transfusion for next 1-2 days and bleeding scan for work up in case of medical instability.   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  Dr. Erlinda Hong felt that patient with cardioembolic  L-PCA infarct due to low EF and recent MI.  Subjective/Complaints: "where are you?" Requires cuing to look /scan R visual field  Review of systems denies abdominal pain nausea vomiting or blood in stools, no breathing issues Objective: Vital Signs: Blood pressure 110/53, pulse 91, temperature 98.8 F (37.1 C), temperature source Oral, resp. rate 18, SpO2 95 %. No results found. No results found for this or any previous visit (from the past 72 hour(s)).   HEENT: normal Cardio: RRR and no murmurs Resp: CTA B/L and unlabored GI: BS positive and nontender nondistended Extremity:  Pulses positive and No Edema Skin:   Other IV sites without erythema or tenderness right forearm does have bruising right forearm Neuro: Alert/Oriented, Normal Sensory, Normal Motor and Other right homonymous hemianopsia and R Neglect Musc/Skel:  Other no pain with range of motion Gen.  no acute distress   Assessment/Plan: 1. Functional deficits secondary to left occipital CVA with Right  homonymous hemianopsia/Neglect which require 3+ hours per day of interdisciplinary therapy in a comprehensive inpatient rehab setting. Physiatrist is providing close team supervision and 24 hour management of active medical problems listed below. Physiatrist and rehab team continue to assess barriers to discharge/monitor patient progress toward functional and medical goals. FIM:       Function - Toileting Toileting steps completed by patient: Adjust clothing prior to toileting, Performs perineal hygiene, Adjust clothing after toileting Toileting Assistive Devices: Grab bar or rail Assist level: Supervision or verbal cues  Function - Air cabin crew transfer assistive device: Grab bar Assist level to toilet: Supervision or verbal cues Assist level from toilet: Supervision or verbal cues  Function - Chair/bed transfer Chair/bed transfer method: Ambulatory Chair/bed transfer assist level: Supervision or verbal cues Chair/bed transfer details: Verbal cues for precautions/safety  Function - Locomotion: Wheelchair Will patient use wheelchair at discharge?: No Wheelchair activity did not occur: N/A Function - Locomotion: Ambulation Assistive device: No device Max distance: 150 Assist level: Supervision or verbal cues Assist level: Supervision or verbal cues Assist level: Supervision or verbal cues Assist level: Supervision or verbal cues Assist level: Touching or steadying assistance (Pt > 75%)  Function - Comprehension Comprehension: Auditory Comprehension assist level: Follows basic conversation/direction with no assist  Function - Expression Expression: Verbal Expression assist level: Expresses basic needs/ideas: With extra time/assistive device  Function - Social Interaction Social Interaction assist level: Interacts appropriately 90% of the time - Needs monitoring or encouragement for participation or interaction.  Function - Problem Solving Problem solving assist  level: Solves basic 75 -  89% of the time/requires cueing 10 - 24% of the time  Function - Memory Memory assist level: Recognizes or recalls 50 - 74% of the time/requires cueing 25 - 49% of the time Patient normally able to recall (first 3 days only): Location of own room, Current season, That he or she is in a hospital   Medical Problem List and Plan: 1. Functional deficits secondary to left occipital CVA, Gait disturbance related to stroke, right homonymous hemianopsia, no significant hemiparesis 2.  DVT Prophylaxis/Anticoagulation: Pharmaceutical: Lovenox, will check plt as part of CBC on 10/18 3. Pain Management: Tylenol as needed, no specific pain issues currently 4. Mood: Monitor for signs of depression, neuro psych consult if needed 5. Neuropsych: This patient Is capable of making decisions on His own behalf. 6. Skin/Wound Care: Monitor for signs of skin breakdown 7. Fluids/Electrolytes/Nutrition: Intake and output, nutritional supplements as needed 8. CAD s/p STEMI: On ASA,Plavix 9.  LGIB:Will need follow-up with GI as an outpatient, we will monitor serial CBCs as well as stools, stable at 8.9 on 10/14, recheck in am, no hematochezia 10. HTN:Was on Coreg, Lasix, Prinivil at home , restarted coreg on 10/13, HR and BP ok, no need to restart prinivil at present 11.  Hypoalbuminemia- start beneprotein  LOS (Days) 4 A FACE TO FACE EVALUATION WAS PERFORMED  KIRSTEINS,ANDREW E 05/22/2015, 8:02 AM

## 2015-05-22 NOTE — Progress Notes (Signed)
Social Work Assessment and Plan  Patient Details  Name: Damon Walker MRN: [id number] Date of Birth: 1945/12/20  Today's Date: 05/22/2015  Problem List:  Patient Active Problem List   Diagnosis Date Noted  . 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 Right homonymous hemianopsia 05/18/2015  . Gait disturbance, post-stroke 05/18/2015  . Acute left PCA stroke ([place]) 05/18/2015  . Coronary artery disease involving native coronary artery of native heart without angina pectoris 05/16/2015  . ST elevation myocardial infarction (STEMI) of anterior wall, subsequent episode of care ([place]) 05/15/2015  . Lower GI bleed 05/15/2015  . Stroke with cerebral ischemia ([place]) 05/14/2015  . Carotid arterial disease ([place]) 05/11/2015  . ST elevation (STEMI) myocardial infarction involving left anterior descending coronary artery ([place]) 05/11/2015  . Presence of drug coated stent in LAD coronary artery 05/08/2015  . HTN (hypertension) 07/05/2014  . HLD (hyperlipidemia) 07/05/2014  . Tobacco abuse 07/05/2014  . Pre-syncope 06/22/2014   Past Medical History:  Past Medical History  Diagnosis Date  . Untreated Hypertension   . Tobacco abuse     a. 30 yrs - 1.5 ppd.  Marland Kitchen HLD (hyperlipidemia)   . Carotid arterial disease ([place])   . CAD (coronary artery disease)     Cath 05/11/2015 three-vessel CAD with 90% prox RCA, 80% mid RCA, 80% OM3, 100% prox LAD occlusion treated with DESx2. RCA and OM residual treated medically for now, however will need PCI if has recurrent CP  . Prediabetes     A1C 6.0 in Oct 2016  . Ischemic cardiomyopathy     EF 40% on cath 05/11/2015 after anterior STEMI, EF 20-25% on echo 05/13/2015   Past Surgical History:  Past Surgical History  Procedure Laterality Date  . S/p appendectomy      Age 69  . S/p inguinal hernia repair      In his 69's.  . Cardiac catheterization N/A 05/11/2015    Procedure: Left Heart Cath and Coronary Angiography;   Surgeon: Peter M Martinique, MD;  Location: [facility];  Service: Cardiovascular;  Laterality: N/A;  . Cardiac catheterization N/A 05/11/2015    Procedure: Coronary Stent Intervention;  Surgeon: Peter M Martinique, MD;  Location: [facility];  Service: Cardiovascular;  Laterality: N/A;  . Flexible sigmoidoscopy N/A 05/18/2015    Procedure: FLEXIBLE SIGMOIDOSCOPY;  Surgeon: Clarene Essex, MD;  Location[facility];  Service: Endoscopy;  Laterality: N/A;   Social History:  reports that he has been smoking Cigarettes.  He has a 30 pack-year smoking history. He does not have any smokeless tobacco history on file. He reports that he does not drink alcohol or use illicit drugs.  Family / Support Systems Marital Status: Married How Long?: 30 years Patient Roles: Spouse Spouse/Significant Other: Damon Walker - wife - [phone] Children: Damon Walker and son who both live in [place]. Anticipated Caregiver: wife, with children visiting often Ability/Limitations of Caregiver: wife works at a [employer] from 6 am till 2:30 pm.  She indicates that she plans to take take off from work to be with her husband.  We discussed that pt. could have ongoing needs for 24/7 supervision Caregiver Availability: Other (Comment) (24/7 at first, but will need to go back to work) [employer]: supportive wife and children, but children live in [place] History Preferred language: English Religion: Unknown Read: Yes Write: Yes Date Retired/Disabled/Unemployed: 2006 Age Retired: 69 Legal History/Current Legal Issues: none reported Guardian/Conservator: N/A as pt is able  to make his own decisions per MD H&P   Abuse/Neglect Physical Abuse: Denies Verbal Abuse: Denies Sexual Abuse: Denies Exploitation of patient/patient's resources: Denies Self-Neglect: Denies  Emotional Status Pt's affect, behavior and adjustment status: Pt was very tired during CSW visit, so he mainly slept, but family  reports that he is frustrated that this has happened right on the heels of his heart attack and subsequent hospitalization. Recent Psychosocial Issues: Pt game to [facility] with a MI, had stents placed and was d/c'd after 3 days.  He then required readmission on the same day of d/c for a stroke. Psychiatric History: none reported Substance Abuse History: none reported  Patient / Family Perceptions, Expectations & Goals Pt/Family understanding of illness & functional limitations: Pt's family seems to have a good grasp of pt's medical condition and do not have any unanswered questions at this time.  CSW explained that PA is available during the day should needs arise. Premorbid pt/family roles/activities: Pt enjoyed doing things around the house and getting out daily for errands.  He was active and independent. Anticipated changes in roles/activities/participation: Pt would like to resume activities as soon as possible, although wife knows he will not be able to drive. Pt/family expectations/goals: Pt's wife feels pt's goal would be to improve his vision, as she knows this was impacted by the stroke.  Community Resources [employer] Scripts: None Premorbid Home Care/DME Agencies: None Transportation available at discharge: wife Resource referrals recommended: Neuropsychology, Support group (specify)  Discharge Planning Living Arrangements: Spouse/significant other Support Systems: Spouse/significant other, Children Type of Residence: Private residence Administrator, sports: Multimedia programmer (specify) ([place] [employer]) Museum/gallery curator Resources: Fish farm manager, Family Support Financial Screen Referred: No (CSW talked with wife about Medicaid to help with medications, but wife feels they will not qualify.) Money Management: Patient, Spouse Does the patient have any problems obtaining your medications?: No (Pt's new cardiac medications were verey expensive and wife reports he does not have  medication coverage.  CSW questioned this, as pt has a managed care Medicare plan.  Wife to check policy. Pt gets some meds from [place].  ) Home Management: Pt/wife share these responsibilities. Patient/Family Preliminary Plans: Wife plans to take some time off to be with pt when he goes home. Barriers to Discharge: Steps, Self care, Family Support Social Work Anticipated Follow Up Needs: HH/OP, Support Group Expected length of stay: 7-10 days  Clinical Impression CSW met with pt, his wife, and his dtr to introduce self and role of CSW, as well as to complete assessment.  Pt was sleepy during visit, but he did acknowledge CSW briefly before sleeping again.  Pt was eating better than he had in days.  Pt's dtr is visiting from [place] and wife is off of work to be with pt.  Son will be coming to visit pt from [place] probably around the time pt is d/c'd.  Wife does not report a lot of support in the community, but has some family support, though many of them live away.  Pt's goals are mod I and wife hopes he attain this so she can eventually return to work.  Pt's wife reports he is "depressed/frustrated" that he had another medical issue and required further hospitalization.  Family and CSW to monitor this in pt's mood  .CSW will assist with any f/u care and DME and support pt/family while admitted to CIR.  No immediate needs/concern/questions at this time, but CSW remains available as needed.  Omarius Grantham, Silvestre Mesi 05/22/2015, 11:36 AM

## 2015-05-22 NOTE — Progress Notes (Addendum)
Physical Therapy Session Note  Patient Details  Name: Damon Walker MRN: [id number] Date of Birth: 05-Aug-1946  Today's Date: 05/22/2015 PT Individual Time: 0800-0900 PT Individual Time Calculation (min): 60 min   Short Term Goals: Week 1:= LTGs due to LOS     Skilled Therapeutic Interventions/Progress Updates:   Pt sound asleep in bed; awoke slowly to name.  He stated he did not sleep well last night, due to "stomach problems" but was unable to fully describe the problem.  Pt is very vague when answering questions.    Supervision gait without AD in room and unit. Up/down 12 corner steps 2 rails with self selected step through method.  neuromuscular re-education via forced use, manual cues, VCS for activities below.  NUStep at level 4 x 10 minutes; rated 13 on BORG scale. During external perturbations, pt demonstrated = ankle strategies L><R, but delayed R hip strategy.  No stepping strategy elicited.   Pt required multiple seated rest breaks during session.  RN returned pt to room, ambulating.    Therapy Documentation Precautions:  Precautions Precautions: Fall Precaution Comments: R visual field deficits. Restrictions Weight Bearing Restrictions: No   Vital Signs: Therapy Vitals Pulse Rate: 93 BP: (!) 115/57 mmHg Patient Position (if appropriate): Sitting Oxygen Therapy SpO2: 98 % O2 Device: Not Delivered Pain: Pain Assessment Pain Assessment: No/denies pain     Other Treatments: Treatments Therapeutic Activity: NUSTep for RUE and RLE activation/attention; standing on wedge and Airex mat during external perturbations to elicit balance strategies; closed chain hip abduction in standing R/L; repetitive sit><stands from low mat without use of UEs, focusing on eccentric control, while using RUe to retrieve and match playing cards on R overhead board Neuromuscular Facilitation: Right;Upper Extremity;Lower Extremity;Activity to increase sustained activation;Activity to  increase grading   See Function Navigator for Current Functional Status.   Therapy/Group: Individual Therapy  Aprile Dickenson 05/22/2015, 11:46 AM

## 2015-05-22 NOTE — Progress Notes (Addendum)
[facility] Individual Statement of Services  Patient Name:  Damon Walker  Date:  05/22/2015  Welcome to the [facility].  Our goal is to provide you with an individualized program based on your diagnosis and situation, designed to meet your specific needs.  With this comprehensive rehabilitation program, you will be expected to participate in at least 3 hours of rehabilitation therapies Monday-Friday, with modified therapy programming on the weekends.  Your rehabilitation program will include the following services:  Physical Therapy (PT), Occupational Therapy (OT), Speech Therapy (ST), Neuropsychology, Case Management (Social Worker), Rehabilitation Medicine, Nutrition Services and Pharmacy Services  Weekly team conferences will be held on Wednesdays to discuss your progress.  Your Social Worker will talk with you frequently to get your input and to update you on team discussions.  Team conferences with you and your family in attendance may also be held.  Expected length of stay: 7 to 10 days  Overall anticipated outcome: Supervision for cognitive and visual deficits  Depending on your progress and recovery, your program may change. Your Social Worker will coordinate services and will keep you informed of any changes. Your Social Worker's name and contact numbers are listed  below.  The following services may also be recommended but are not provided by the [facility] will be made to provide these services after discharge if needed.  Arrangements include referral to agencies that provide these services.  Your insurance has been verified to be:  [employer] Your primary doctor is:    Pertinent information will be shared with your doctor and your insurance company.  Social Worker:  Alfonse Alpers, LCSW  [phone] or [phone]  Information discussed with and copy given to patient by: Trey Sailors, 05/22/2015, 11:20 AM

## 2015-05-22 NOTE — Progress Notes (Signed)
Occupational Therapy Session Note  Patient Details  Name: Damon Walker MRN: [id number] Date of Birth: 1945/12/06  Today's Date: 05/22/2015 OT Individual Time: 1000-1057 OT Individual Time Calculation (min): 57 min    Short Term Goals: Week 1:  OT Short Term Goal 1 (Week 1): STG= LTG  Skilled Therapeutic Interventions/Progress Updates:    Worked on visual compensation treatments with mobility and with use of the Dynavision during session.  Pt able to ambulate with supervision to the therapy room but maintains himself on the left side of the hallway when walking.  Testing revealed significant deficits in the right upper and lower quadrant using the dynavision.  Performed multiple intervals of using the dynavision for scanning to the right.  Pt able to demonstrate only 50% accuracy on the right when scanning with only 3 seconds of flashing between light.  When adding in second variable of stating number or word on the screen while pushing out the lights.   Pt only able to state word 20% of the time and less than 30% accuracy on the right side of the board with several intervals.  Functionally had him ambulate back to the room, locating items that were red and pointing them out.   He was 50-60% accurate for this task.  Pt left with his spouse in the room.    Therapy Documentation Precautions:  Precautions Precautions: Fall Precaution Comments: R visual field deficits. Restrictions Weight Bearing Restrictions: No  Pain: Pain Assessment Pain Assessment: No/denies pain ADL: See Function Navigator for Current Functional Status.   Therapy/Group: Individual Therapy  Dennise Raabe OTR/L 05/22/2015, 12:23 PM

## 2015-05-22 NOTE — Progress Notes (Signed)
Occupational Therapy Session Note  Patient Details  Name: Damon Walker MRN: [id number] Date of Birth: 1945/10/02  Today's Date: 05/22/2015 OT Individual Time: 6295-2841 OT Individual Time Calculation (min): 31 min    Skilled Therapeutic Interventions/Progress Updates:   Pt worked on visual scanning activities both table top and environmental.  Pt was able to complete a table top letter cancellation sheet with 99% accuracy including appropriate horizontal scanning pattern to locate the letters to be crossed out.  Progressed to environmental scanning to ambulate and locate yellow post it notes placed in various locations from his room to the therapy gym.  Out of 12 notes, with first walk he was only able to locate 5, and all were on the left side of midline.  Pt did not locate any placed on the right.  On second attempt to locate the 7 he was able to find 6 and needed mod instructional cueing to locate the last one.  Pt encouraged to use a circular scanning pattern to locate items in the hallway and environment.  With return to room pt's daughter and his spouse were present and discussed his continued difficulty with locating items right of midline.  Issued pt word search handout to continue working on in his own time.    Therapy Documentation Precautions:  Precautions Precautions: Fall Precaution Comments: R visual field deficits. Restrictions Weight Bearing Restrictions: No  Pain: Pain Assessment Pain Assessment: No/denies pain ADL: See Function Navigator for Current Functional Status.   Therapy/Group: Individual Therapy  Daxon Kyne OTR/L 05/22/2015, 4:15 PM

## 2015-05-22 NOTE — Progress Notes (Signed)
Speech Language Pathology Daily Session Note  Patient Details  Name: Damon Walker MRN: [id number] Date of Birth: [birth year]  Today's Date: 05/22/2015 SLP Individual Time: 1106-1201 SLP Individual Time Calculation (min): 55 min  Short Term Goals: Week 1: SLP Short Term Goal 1 (Week 1): STG=LTG due to ELOS   Skilled Therapeutic Interventions:  Pt was seen for skilled ST targeting cognitive goals.  SLP facilitated the session with basic money management tasks targeting functional problem solving and attention to tasks. Pt required max assist multimodal cues to count money, generate values when named, and make change due to very brief sustained attention to tasks which impacted his working memory for recall of task requirements.  Pt also required frequent repetition of instructions due to impaired recall.  Furthermore, pt demonstrated poor awareness of deficits and required mod verbal cues to ask for help when needed.  He also benefited from mod verbal cues to recognize and correct errors during the abovementioned task.  Pt ambulated back to room with mod verbal cues for route recall and min tactile cues to avoid obstacles on the right.  Pt was left in armchair with wife at bedside.  Continue per current plan of care.    Function:  Eating Eating                 Cognition Comprehension Comprehension assist level: Follows basic conversation/direction with extra time/assistive device  Expression   Expression assist level: Expresses basic needs/ideas: With extra time/assistive device  Social Interaction Social Interaction assist level: Interacts appropriately 75 - 89% of the time - Needs redirection for appropriate language or to initiate interaction.  Problem Solving Problem solving assist level: Solves basic 50 - 74% of the time/requires cueing 25 - 49% of the time  Memory Memory assist level: Recognizes or recalls 50 - 74% of the time/requires cueing 25 - 49% of the time     Pain Pain Assessment Pain Assessment: No/denies pain  Therapy/Group: Individual Therapy  Neita Landrigan, Selinda Orion 05/22/2015, 12:52 PM

## 2015-05-23 ENCOUNTER — Inpatient Hospital Stay (HOSPITAL_COMMUNITY): Payer: Medicare Other | Admitting: Occupational Therapy

## 2015-05-23 ENCOUNTER — Inpatient Hospital Stay (HOSPITAL_COMMUNITY): Payer: Medicare Other | Admitting: *Deleted

## 2015-05-23 ENCOUNTER — Inpatient Hospital Stay (HOSPITAL_COMMUNITY): Payer: Medicare Other

## 2015-05-23 ENCOUNTER — Inpatient Hospital Stay (HOSPITAL_COMMUNITY): Payer: Medicare Other | Admitting: Speech Pathology

## 2015-05-23 LAB — URINALYSIS, ROUTINE W REFLEX MICROSCOPIC
GLUCOSE, UA: NEGATIVE mg/dL
HGB URINE DIPSTICK: NEGATIVE
Ketones, ur: 15 mg/dL — AB
Leukocytes, UA: NEGATIVE
Nitrite: NEGATIVE
PH: 5 (ref 5.0–8.0)
Protein, ur: 30 mg/dL — AB
SPECIFIC GRAVITY, URINE: 1.027 (ref 1.005–1.030)
UROBILINOGEN UA: 0.2 mg/dL (ref 0.0–1.0)

## 2015-05-23 LAB — URINE MICROSCOPIC-ADD ON

## 2015-05-23 MED ORDER — PANTOPRAZOLE SODIUM 40 MG PO TBEC: 40.0000 mg | DELAYED_RELEASE_TABLET | Freq: Two times a day (BID) | ORAL | Status: DC

## 2015-05-23 MED ORDER — FAMOTIDINE 20 MG PO TABS
20.0000 mg | ORAL_TABLET | Freq: Two times a day (BID) | ORAL | Status: DC
  Administered 2015-05-23: 20 mg via ORAL
  Filled 2015-05-23: qty 1

## 2015-05-23 MED ORDER — ENSURE ENLIVE PO LIQD
237.0000 mL | Freq: Three times a day (TID) | ORAL | Status: DC
  Administered 2015-05-23 – 2015-05-24 (×3): 237 mL via ORAL

## 2015-05-23 MED ORDER — PANTOPRAZOLE SODIUM 40 MG PO TBEC
40.0000 mg | DELAYED_RELEASE_TABLET | Freq: Two times a day (BID) | ORAL | Status: DC
  Administered 2015-05-23 – 2015-05-26 (×6): 40 mg via ORAL
  Filled 2015-05-23 (×6): qty 1

## 2015-05-23 NOTE — Progress Notes (Signed)
Occupational Therapy Session Note  Patient Details  Name: Damon Walker MRN: [id number] Date of Birth: 06-14-1946  Today's Date: 05/23/2015 OT Individual Time: 0900-1000 OT Individual Time Calculation (min): 60 min    Short Term Goals: Week 1:  OT Short Term Goal 1 (Week 1): STG= LTG  Skilled Therapeutic Interventions/Progress Updates:    First session:  Pt completed shower and dressing during session.  Mod questioning cueing to sequence gathering clothing and items such as washcloth and towel, that would be needed during session.  Pt stood in the shower with close supervision for bathing with use of the grab bar for support when reaching down to his feet.  He was able to complete dressing sit to stand at the bedside chair with supervision but needed 2 trips to the clothing drawers to find all of the items he needed.  Pt with difficulty finding grooming items on his right when sitting in the chair and attempting to place his dentures in the denture cup along with denture cleaner.  When pouring water into the denture cup pt accidentally moved the water bottle too far to the right and spilled it on the dresser.  Min instructional cueing needed to note the spill and then gather paper towels to clean it up.  Pt did not complete scanning sheet given to him for homework yesterday so attempted to have him perform it at end of session.  Pt needed 3 cues to initiate looking for words but did not find anymore during this session.  Educated pt on scanning techniques and use of a guide to help follow line by line.  Call button and safety belt in place at end of session.   Second session:  (11:05-12:00)  Pt performed shaving and denture care during session.  Pt needed multiple cues to initiate shaving task.  Initially pt sat beside of his dresser and attempted to start shaving.  He needed mod instructional cueing in order to remember to walk over to the sink to perform shaving and for donning his dentures.  Pt  again after shaving sat down beside of his dresser and attempted to take his dentures out of the full denture cup and place in his mouth.  Therapist noted that denture cup was also close to sliding off of the right edge of the dresser and pt was not attempting to stop or reposition it.  Therapist finally intervened and gave max cueing to walk over to the sink and rinse the dentures out and then put them in over there.  Finished session with ambulation to the ADL apartment.  Had pt work on scanning refrigerator and cabinets to locate items therapist asked for.  Pt needing increased time to locate, especially if on the right side.  When taking a rest break after task, pt was able to explain the items needed for frying an egg with min questioning cueing.  Will practice simple meal prep later this week.  Pt ambulate back to room with safety belt and call button in place.    Therapy Documentation Precautions:  Precautions Precautions: Fall Precaution Comments: R visual field deficits. Restrictions Weight Bearing Restrictions: No  Pain: Pain Assessment Pain Assessment: No/denies pain ADL: See Function Navigator for Current Functional Status.   Therapy/Group: Individual Therapy  Adaora Mchaney OTR/L 05/23/2015, 10:00 AM

## 2015-05-23 NOTE — Progress Notes (Addendum)
Nursing reports poor po intake. Not interested in food and has nausea with meals. Was on Protonix in the past?. Will add pepcid to help with GI symptoms and add ensure between meals. Will check lytes in am to monitor renal status.

## 2015-05-23 NOTE — Progress Notes (Addendum)
Physical Therapy Session Note  Patient Details  Name: Damon Walker MRN: [id number] Date of Birth: 06-30-1946  Today's Date: 05/23/2015 PT Individual Time: 0800-0830; 4163-8453 PT Individual Time Calculation (min): 30 min , 30 min  Short Term Goals: Week 1:     Skilled Therapeutic Interventions/Progress Updates:  Tx 1:  Pt reported feeling nauseated, and having just vomited.  PT consulted RN, who said pt vomited small amount of food, plus meds.  He has not had anti -nausea med.  Pt willing to do tx in room.   Pt needed multiple seated rest breaks throughout session.  neuromuscular re-education via VCs, demo for [employer] Prevention exs: 1 x 10 each standing calf raises, toe raises, mini squats, tandem stand x 30 seconds R/L; also 10 x 1 sitting bil hip adduction against resistance, heel slides, alternating R/L heel taps with knee flexion/extension.  During tandem stand with R foot in back, pt had repeated LOB backwards and to L requiring mod assist to recover.  He was unaware of LOB.  Pt left sitting in recliner, with quick release belt applied, and all needs left within reach.  tx 2:  Wife and daughter present in room.  Pt stated he did not feel well, not nauseated or in pain, but appeared ill.  BP 105/48; HR 96, sitting at rest.  Pt had not eaten any lunch.  PT consulted RN; awaiting call back from PA.  Pt required multiple seated rest breaks throughout session.  Gait in room to/from toilet, with supervision.  Toilet transfer; pt had to be reminded to lower pants before sitting. Despite sitting on toilet x 5 minutes, pt did not urinate at all.  Gait to gym, up/down 12 corner stairs with L rail, using self -selected step- through method safely.  neuromuscular re-education via VCs, manual cues for wt shifting, R visual attention to tap floor targets in floor clock, with R and L feet; 1 LOB when tapping with R foot across midline.  VCs for attend to targets on R.  Gait to return to  room; resting in recliner with all needs within reach, and quick release belt donned.  At end of session, pt's wife requested being trained in ambulating pt to/from BR.  PT consulted with SLP who will see pt later in day, can educate wife and dtr about R inattention, and train as appropriate.     Therapy Documentation Precautions:  Precautions Precautions: Fall Precaution Comments: R visual field deficits. Restrictions Weight Bearing Restrictions: No     See Function Navigator for Current Functional Status.   Therapy/Group: Individual Therapy  Damon Walker 05/23/2015, 8:21 AM

## 2015-05-23 NOTE — Progress Notes (Signed)
Speech Language Pathology Daily Session Note  Patient Details  Name: Damon Walker MRN: [id number] Date of Birth: 10/11/1945  Today's Date: 05/23/2015 SLP Individual Time: 3606-7703 SLP Individual Time Calculation (min): 26 min  Short Term Goals: Week 1: SLP Short Term Goal 1 (Week 1): STG=LTG due to ELOS   Skilled Therapeutic Interventions:  Pt was seen for skilled ST targeting cognitive goals.  Upon arrival, pt was partially reclined in bed, with reports of frequent nausea and vomiting throughout day.  Pt appeared fatigued and ill but was agreeable to participate in [place], RN made aware.  SLP facilitated the session with a basic card game targeting functional problem solving and sustained attention.  Pt completed the abovementioned task with overall min assist verbal cues for planning and organization.  Pt also benefited from min verbal cues to recognize and correct errors.  Pt sustained attention to task for its duration (~20 minutes) Pt was left in bed with wife and daughter at bedside.  All needs were left within reach.  Continue per current plan of care.    Function:  Eating Eating                 Cognition Comprehension Comprehension assist level: Understands basic 90% of the time/cues < 10% of the time  Expression   Expression assist level: Expresses basic 75 - 89% of the time/requires cueing 10 - 24% of the time. Needs helper to occlude trach/needs to repeat words.  Social Interaction Social Interaction assist level: Interacts appropriately 90% of the time - Needs monitoring or encouragement for participation or interaction.  Problem Solving Problem solving assist level: Solves basic 75 - 89% of the time/requires cueing 10 - 24% of the time  Memory Memory assist level: Recognizes or recalls 50 - 74% of the time/requires cueing 25 - 49% of the time    Pain Pain Assessment Pain Assessment: No/denies pain  Therapy/Group: Individual Therapy  Jaine Estabrooks, Selinda Orion 05/23/2015,  4:28 PM

## 2015-05-23 NOTE — Progress Notes (Signed)
Family advised to order meals for patient as patient reports lack of appetite.  Also discussed bland diet as patient has large [place] and recent issues with esophageal irritation.  Will change pepcid to protonix and order reflux precautions.  Questioned adjustment issues and patient admits to being frustrated but denies depression and does not see any need for antidepressant. His wife reports chronic insomnia--" up at night and naps during the day"

## 2015-05-23 NOTE — Progress Notes (Signed)
Recreational Therapy Assessment and Plan  Patient Details  Name: Damon Walker MRN: [id number] Date of Birth: Apr 25, 1946 Today's Date: 05/23/2015  Assessment Clinical Impression:. Problem List:  Patient Active Problem List   Diagnosis Date Noted  . 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 Right homonymous hemianopsia 05/18/2015  . Gait disturbance, post-stroke 05/18/2015  . Acute left PCA stroke ([place]) 05/18/2015  . Coronary artery disease involving native coronary artery of native heart without angina pectoris 05/16/2015  . ST elevation myocardial infarction (STEMI) of anterior wall, subsequent episode of care ([place]) 05/15/2015  . Lower GI bleed 05/15/2015  . Stroke with cerebral ischemia ([place]) 05/14/2015  . Carotid arterial disease ([place]) 05/11/2015  . ST elevation (STEMI) myocardial infarction involving left anterior descending coronary artery ([place]) 05/11/2015  . Presence of drug coated stent in LAD coronary artery 05/08/2015  . HTN (hypertension) 07/05/2014  . HLD (hyperlipidemia) 07/05/2014  . Tobacco abuse 07/05/2014  . Pre-syncope 06/22/2014    Past Medical History:  Past Medical History  Diagnosis Date  . Untreated Hypertension   . Tobacco abuse     a. 30 yrs - 1.5 ppd.  Marland Kitchen HLD (hyperlipidemia)   . Carotid arterial disease ([place])   . CAD (coronary artery disease)     Cath 05/11/2015 three-vessel CAD with 90% prox RCA, 80% mid RCA, 80% OM3, 100% prox LAD occlusion treated with DESx2. RCA and OM residual treated medically for now, however will need PCI if has recurrent CP  . Prediabetes     A1C 6.0 in Oct 2016  . Ischemic cardiomyopathy     EF 40% on cath 05/11/2015 after anterior STEMI, EF 20-25% on echo 05/13/2015   Past Surgical History:  Past Surgical History  Procedure Laterality Date  . S/p appendectomy      Age 64  . S/p  inguinal hernia repair      In his 20's.  . Cardiac catheterization N/A 05/11/2015    Procedure: Left Heart Cath and Coronary Angiography; Surgeon: Peter M Martinique, MD; Location: [facility]; Service: Cardiovascular; Laterality: N/A;  . Cardiac catheterization N/A 05/11/2015    Procedure: Coronary Stent Intervention; Surgeon: Peter M Martinique, MD; Location: [facility]; Service: Cardiovascular; Laterality: N/A;    Assessment & Plan Clinical Impression: Patient is a 69 y.o. year old male with recent admission to the hospital for CAD with ICM s/p cath with stenting for STEMI on 05/11/15. He was discharged to home on 10/09 am and developed sudden onset of confusion with right visual field deficits after lunch. He was re-admitted for work up and CCT negative for acute changes.Patient with L PCA infarct. Patient transferred to CIR on 05/18/2015 .      Met with pt & wife today to discuss TR services and the importance of staying active and engaged in activities of choice post discharge even with modifications.  Pt stated that he "wouldn't be able to do anything anymore" and required min-mod questioning cues to identify activities for participation post discharge.  Discussed the importance of leisure and its impact on overall health & wellness with specific focus on psychological health.  Both stated understanding.  Wife concerned that pt is depressed. When questioned about this, pt responded "I'm just anxious to see how this is all going to work out."  Wife encouraged by pts comment. No further TR at this time.  Will continue to monitor through team during LOS.  Leisure History/Participation Premorbid leisure interest/current participation: Petra [place] -  Lawn care;Community - Shopping mall;Community - Grocery store;Community - Travel (Comment) Other Leisure Interests: Television;Reading;Computer;Housework Leisure Participation Style: Alone;With Family/Friends Awareness of  Community Resources: Good-identify 3 post discharge leisure resources Psychosocial / Spiritual Social interaction - Mood/Behavior: Cooperative Academic librarian Appropriate for Education?: Yes Strengths/Weaknesses Patient Strengths/Abilities: Active premorbidly Patient weaknesses: Physical limitations TR Patient demonstrates impairments in the following area(s): Endurance;Safety  The above assessment, treatment plan, treatment alternatives and goals were discussed and mutually agreed upon: by patient  [place] 05/23/2015, 3:15 PM

## 2015-05-23 NOTE — Progress Notes (Signed)
Subjective/Complaints:  Patient states he did not sleep well. Denies pain keeping him awake. No urinary frequency  Review of systems denies abdominal pain nausea vomiting or blood in stools, no breathing issues Objective: Vital Signs: Blood pressure 124/73, pulse 99, temperature 99 F (37.2 C), temperature source Oral, resp. rate 18, SpO2 96 %. No results found. No results found for this or any previous visit (from the past 72 hour(s)).   HEENT: normal Cardio: RRR and no murmurs Resp: CTA B/L and unlabored GI: BS positive and nontender nondistended Extremity:  Pulses positive and No Edema Skin:   Other IV sites without erythema or tenderness right forearm does have bruising right forearm Neuro: Alert/Oriented, Normal Sensory, Normal Motor and Other right homonymous hemianopsia and R Neglect Musc/Skel:  Other no pain with range of motion Gen.  no acute distress   Assessment/Plan: 1. 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 Physiatrist is providing close team supervision and 24 hour management of active medical problems listed below. Physiatrist and rehab team continue to assess barriers to discharge/monitor patient progress toward functional and medical goals. FIM: Function - Bathing Position: Standing at sink Body parts bathed by patient: Right arm, Chest, Abdomen, Front perineal area, Right upper leg, Left upper leg Body parts bathed by helper: Left arm, Buttocks, Right lower leg, Left lower leg, Back Assist Level: Touching or steadying assistance(Pt > 75%)  Function- Upper Body Dressing/Undressing What is the patient wearing?: Hospital gown Assist Level: Touching or steadying assistance(Pt > 75%) Function - Lower Body Dressing/Undressing What is the patient wearing?: Hospital Gown, Non-skid slipper socks Non-skid  slipper socks- Performed by patient: Don/doff right sock, Don/doff left sock (per Opal Sidles, OT report) Assist for footwear: Supervision/touching assist (per report) Assist for lower body dressing: Touching or steadying assistance (Pt > 75%)  Function - Toileting Toileting steps completed by patient: Adjust clothing prior to toileting, Performs perineal hygiene, Adjust clothing after toileting Toileting Assistive Devices: Grab bar or rail Assist level: Supervision or verbal cues  Function - Air cabin crew transfer assistive device: Grab bar Assist level to toilet: Supervision or verbal cues Assist level from toilet: Supervision or verbal cues  Function - Chair/bed transfer Chair/bed transfer method: Ambulatory Chair/bed transfer assist level: Touching or steadying assistance (Pt > 75%) Chair/bed transfer details: Verbal cues for precautions/safety  Function - Locomotion: Wheelchair Will patient use wheelchair at discharge?: No Wheelchair activity did not occur: N/A Function - Locomotion: Ambulation Assistive device: No device Max distance: 150 Assist level: Supervision or verbal cues Assist level: Supervision or verbal cues Assist level: Supervision or verbal cues Assist level: Supervision or verbal cues Assist level: Touching or steadying assistance (Pt > 75%)  Function - Comprehension Comprehension: Auditory Comprehension assist level: Follows basic conversation/direction with extra time/assistive device  Function - Expression Expression: Verbal Expression assist level: Expresses basic 75 - 89% of the time/requires cueing 10 - 24% of the time. Needs helper to occlude trach/needs to repeat words.  Function - Social Interaction Social Interaction assist level: Interacts appropriately 75 - 89% of the time - Needs redirection for appropriate language or to initiate interaction.  Function - Problem Solving Problem solving assist level: Solves basic 75 - 89% of the  time/requires cueing 10 - 24% of the time  Function - Memory Memory assist level: Recognizes or recalls 50 - 74% of the time/requires cueing 25 - 49% of  the time Patient normally able to recall (first 3 days only): Location of own room, Current season, That he or she is in a hospital   Medical Problem List and Plan: 1. Functional deficits secondary to left occipital CVA, Gait disturbance related to stroke, right homonymous hemianopsia, no significant hemiparesis 2.  DVT Prophylaxis/Anticoagulation: Pharmaceutical: Lovenox, will check plt as part of CBC on 10/18 3. Pain Management: Tylenol as needed, no specific pain issues currently 4. Mood: Monitor for signs of depression, neuro psych consult if needed 5. Neuropsych: This patient Is capable of making decisions on His own behalf. 6. Skin/Wound Care: Monitor for signs of skin breakdown 7. Fluids/Electrolytes/Nutrition: Intake and output, nutritional supplements as needed 8. CAD s/p STEMI: On ASA,Plavix 9.  LGIB:Will need follow-up with GI as an outpatient, we will monitor serial CBCs as well as stools, stable at 8.9 on 10/14, recheck in am, no hematochezia 10. HTN:Was on Coreg, Lasix, Prinivil at home , restarted coreg on 10/13, HR and BP ok at 124/73, no need to restart prinivil at present 11.  Hypoalbuminemia- start beneprotein  LOS (Days) 5 A FACE TO FACE EVALUATION WAS PERFORMED  Skyann Ganim E 05/23/2015, 7:19 AM

## 2015-05-24 ENCOUNTER — Inpatient Hospital Stay (HOSPITAL_COMMUNITY): Payer: Medicare Other | Admitting: Occupational Therapy

## 2015-05-24 ENCOUNTER — Inpatient Hospital Stay (HOSPITAL_COMMUNITY): Payer: Medicare Other | Admitting: Physical Therapy

## 2015-05-24 ENCOUNTER — Inpatient Hospital Stay (HOSPITAL_COMMUNITY): Payer: Medicare Other | Admitting: Speech Pathology

## 2015-05-24 ENCOUNTER — Telehealth: Payer: Self-pay | Admitting: *Deleted

## 2015-05-24 LAB — CBC
HEMATOCRIT: 27.2 % — AB (ref 39.0–52.0)
HEMOGLOBIN: 8.8 g/dL — AB (ref 13.0–17.0)
MCH: 30.8 pg (ref 26.0–34.0)
MCHC: 32.4 g/dL (ref 30.0–36.0)
MCV: 95.1 fL (ref 78.0–100.0)
Platelets: 438 10*3/uL — ABNORMAL HIGH (ref 150–400)
RBC: 2.86 MIL/uL — AB (ref 4.22–5.81)
RDW: 13.2 % (ref 11.5–15.5)
WBC: 13.7 10*3/uL — AB (ref 4.0–10.5)

## 2015-05-24 LAB — BASIC METABOLIC PANEL
ANION GAP: 8 (ref 5–15)
BUN: 11 mg/dL (ref 6–20)
CALCIUM: 8.5 mg/dL — AB (ref 8.9–10.3)
CO2: 28 mmol/L (ref 22–32)
Chloride: 101 mmol/L (ref 101–111)
Creatinine, Ser: 0.94 mg/dL (ref 0.61–1.24)
Glucose, Bld: 108 mg/dL — ABNORMAL HIGH (ref 65–99)
POTASSIUM: 3.8 mmol/L (ref 3.5–5.1)
SODIUM: 137 mmol/L (ref 135–145)

## 2015-05-24 LAB — URINE CULTURE: CULTURE: NO GROWTH

## 2015-05-24 MED ORDER — MIRTAZAPINE 15 MG PO TBDP
15.0000 mg | ORAL_TABLET | Freq: Every day | ORAL | Status: DC
  Administered 2015-05-24 – 2015-05-25 (×2): 15 mg via ORAL
  Filled 2015-05-24 (×3): qty 1

## 2015-05-24 NOTE — Patient Care Conference (Signed)
Inpatient RehabilitationTeam Conference and Plan of Care Update Date: 05/24/2015   Time: 10:35 AM    Patient Name: Damon Walker      Medical Record Number: [id number]  Date of Birth: Sep 09, 1945 Sex: Male         Room/Bed: 4M13C/4M13C-01 Payor Info: Payor: [employer] / Plan: BCBS MEDICARE / Product Type: *No Product type* /    Admitting Diagnosis: l occipital CVA  Admit Date/Time:  05/18/2015  5:05 PM Admission Comments: No comment available   Primary Diagnosis:  Gait disturbance, post-stroke Principal Problem: Gait disturbance, post-stroke  Patient Active Problem List   Diagnosis Date Noted  . 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 Right homonymous hemianopsia 05/18/2015  . Gait disturbance, post-stroke 05/18/2015  . Acute left PCA stroke ([place]) 05/18/2015  . Coronary artery disease involving native coronary artery of native heart without angina pectoris 05/16/2015  . ST elevation myocardial infarction (STEMI) of anterior wall, subsequent episode of care ([place]) 05/15/2015  . Lower GI bleed 05/15/2015  . Stroke with cerebral ischemia ([place]) 05/14/2015  . Carotid arterial disease ([employer]) 05/11/2015  . ST elevation (STEMI) myocardial infarction involving left anterior descending coronary artery ([place]) 05/11/2015  . Presence of drug coated stent in LAD coronary artery 05/08/2015  . HTN (hypertension) 07/05/2014  . HLD (hyperlipidemia) 07/05/2014  . Tobacco abuse 07/05/2014  . Pre-syncope 06/22/2014    Expected Discharge Date: Expected Discharge Date: 05/26/15  Team Members Present: Physician leading conference: Dr. Alysia Penna Social Worker Present: Alfonse Alpers, LCSW Nurse Present: Dorien Chihuahua, RN PT Present: Other (comment) Isabelle Course, PT) OT Present: Clyda Greener, OT SLP Present: Windell Moulding, SLP PPS Coordinator present : Daiva Nakayama, RN, CRRN     Current Status/Progress Goal Weekly Team Focus   Medical   Severe Left neglect, depression  Home with supervision  trial SSRI   Bowel/Bladder   Continent of bowel and bladder. LBM 05/23/15  Pt to remain continent of bowel and bladder  Monitor   Swallow/Nutrition/ Hydration             ADL's   supervision for UB and LB selfcare sit to stand as well as grooming, toileting, and shower transfers.  Pt still with significant right visual field deficit which he needs cueing to compensate for.  Also with some delayed initiation and decreased selective attention in distracting environment  modified independent  selfcare re-training, visual compensation training, balance re-training, transfer training, pt/family education, cognitive re-training,     Mobility   supervision overall; poor activity tolerance  modified independent overall for transfers, gait x 150' and up/down 12 steps  activity tolerance, high level balance, R attention, pt and family ed   Communication             Safety/Cognition/ Behavioral Observations  mod-max assist for basic tasks   min assist   visual scanning to the right, sustained attention, recall of daily information, basic problem solving    Pain   No c/o pain  <3  Monitor for nonverbal cues of pain   Skin   CDI  CDI  Monitor skin q shift    Rehab Goals Patient on target to meet rehab goals: Yes Rehab Goals Revised: Pt's goals are set for overall supervision due to dense field cut with no awareness and inattention. *See Care Plan and progress notes for long and short-term goals.  Barriers to Discharge: See above, reduced safety    Possible Resolutions to Barriers:  neuropsych eval    Discharge Planning/Teaching Needs:  Pt plans to return to his home and wife is arranging 24/7 supervision for pt.  Pt's family is often at bedside, but more formal family education can occur prior to d/c.   Team Discussion:  MD is wondering if pt is depressed following his stroke and heart attack and ST questions  dementia-like symptoms.  Dr. Letta Pate has started Remeron for appetite and sleep and thinks this may help with an underlying depression.  ST says pt can compensate conversationally, but does not do as well with higher level tasks.  OT made an egg with pt and it took much longer than it should due to pt's inattention.  OT feels pt is close supervision.  Pt has poor carryover and field cut impacts his ability to see things in the community.  He is great with table top tasks. Pt's balance is better per PT.    Revisions to Treatment Plan:  none   Continued Need for Acute Rehabilitation Level of Care: The patient requires daily medical management by a physician with specialized training in physical medicine and rehabilitation for the following conditions: Daily direction of a multidisciplinary physical rehabilitation program to ensure safe treatment while eliciting the highest outcome that is of practical value to the patient.: Yes Daily medical management of patient stability for increased activity during participation in an intensive rehabilitation regime.: Yes Daily analysis of laboratory values and/or radiology reports with any subsequent need for medication adjustment of medical intervention for : Neurological problems;Other  Wes Lezotte, Silvestre Mesi 05/24/2015, 11:12 AM

## 2015-05-24 NOTE — Progress Notes (Signed)
Speech Language Pathology Daily Session Note  Patient Details  Name: Damon Walker MRN: [id number] Date of Birth: 1945/09/21  Today's Date: 05/24/2015 SLP Individual Time: 1003-1027 SLP Individual Time Calculation (min): 24 min  Short Term Goals: Week 1: SLP Short Term Goal 1 (Week 1): STG=LTG due to ELOS   Skilled Therapeutic Interventions:  Pt was seen for skilled ST targeting cognitive goals.  Upon arrival, pt was seated upright in recliner, awake, alert,and agreeable to participate in [place]. SLP facilitated the session with a new learning task targeting use of memory compensatory strategies and functional problem solving.  Pt was able to make category-picture associations for recall with min assist verbal cues; however, he required max assist verbal cues to recall categories via associations.  He also required min-mod assist verbal cues for organization and recall of previously named items when naming specific category members.   Throughout task, pt benefited from frequent verbal cues for recall of task procedures and requirements.  Pt was left in recliner with quick release belt donned and call bell left within reach.  Continue per current plan of care.    Function:  Eating Eating                 Cognition Comprehension Comprehension assist level: Understands basic 90% of the time/cues < 10% of the time  Expression   Expression assist level: Expresses basic 75 - 89% of the time/requires cueing 10 - 24% of the time. Needs helper to occlude trach/needs to repeat words.  Social Interaction Social Interaction assist level: Interacts appropriately 90% of the time - Needs monitoring or encouragement for participation or interaction.  Problem Solving Problem solving assist level: Solves basic 75 - 89% of the time/requires cueing 10 - 24% of the time  Memory Memory assist level: Recognizes or recalls 50 - 74% of the time/requires cueing 25 - 49% of the time    Pain Pain  Assessment Pain Assessment: No/denies pain  Therapy/Group: Individual Therapy  Raziyah Vanvleck, Selinda Orion 05/24/2015, 12:42 PM

## 2015-05-24 NOTE — Telephone Encounter (Signed)
Faxed -signed cardiac rehab  Phase ll orders

## 2015-05-24 NOTE — Progress Notes (Addendum)
Speech Language Pathology Daily Session Note  Patient Details  Name: Damon Walker MRN: [id number] Date of Birth: 09-21-1945  Today's Date: 05/24/2015 SLP Individual Time: 36-1435 SLP Individual Time Calculation (min): 38 min  Short Term Goals: Week 1: SLP Short Term Goal 1 (Week 1): STG=LTG due to ELOS   Skilled Therapeutic Interventions:   Pt was seen for skilled ST targeting cognitive goals.  Pt ambulated to ST treatment room with min-mod verbal and visual cues for route recall.  SLP facilitated the session with a basic money management task targeting sustained attention and functional problem solving.  Pt sorted coins into groups by value with mod I for 100% accuracy which is a substantial improvement from when previously targeted in therapy.  Improvements most likely attributable to improved sustained attention to task.  Pt required min verbal cues for working memory of values to complete functional math calculations without the use of a calculator, min-mod assist verbal and visual cues for organization when using a calculator to complete calculations.  Pt left with family upright in chair.  Continue per current plan of care.    Function:  Eating Eating                 Cognition Comprehension Comprehension assist level: Understands basic 90% of the time/cues < 10% of the time  Expression   Expression assist level: Expresses basic 75 - 89% of the time/requires cueing 10 - 24% of the time. Needs helper to occlude trach/needs to repeat words.  Social Interaction Social Interaction assist level: Interacts appropriately 90% of the time - Needs monitoring or encouragement for participation or interaction.  Problem Solving Problem solving assist level: Solves basic 75 - 89% of the time/requires cueing 10 - 24% of the time  Memory Memory assist level: Recognizes or recalls 50 - 74% of the time/requires cueing 25 - 49% of the time    Pain Pain Assessment Pain Assessment:  No/denies pain  Therapy/Group: Individual Therapy  Damon Walker, Selinda Orion 05/24/2015, 4:24 PM

## 2015-05-24 NOTE — Progress Notes (Addendum)
Physical Therapy Note  Patient Details  Name: Stalin Gruenberg MRN: [id number] Date of Birth: July 04, 1946 Today's Date: 05/24/2015    Time: 855-952 57 minutes  1:1 No c/o pain. Pt states "i'm feeling better than yesterday".  Pt able to gait throughout unit with close supervision, cues for avoiding obstacles due to R inattention.  Path finding with min questioning cues.  Balance tasks standing on foam with reaching, squatting with close supervision.  Gait with obstacle negotiation and picking objects up from floor with close supervision, improved obstacle negotiation when in non distracting environment.  Household gait and carrying items with close supervision.  OTAGO exercise program initiated with pt performing all therex with 2# wts.  Nu step for endurance and strenghtening level 4 x 8 minutes.  Pt continues to require frequent rests due to shortness of breath, balance is improving.   Time 2: 1430-1455 25 minutes  1:1 No c/o pain.  Pt's wife present at start of session.  She asked about daily walking program with pt. PT suggested short walks of 5 minutes with rest breaks in between and to aim for 3-4 walks a day. Pt given handout of OTAGO exercises from morning treatment.  Pt states he does not recall performing these exercises.  PT reviewed exercises with patient.  Pt walk for 2 x 5 minutes with 2 x standing rest breaks and seated rest between walks.  Pt limited by shortness of breath and "my legs feel sore".  Pammy Vesey 05/24/2015, 9:53 AM

## 2015-05-24 NOTE — Progress Notes (Signed)
Occupational Therapy Session Note  Patient Details  Name: Damon Walker MRN: [id number] Date of Birth: 1946-03-17  Today's Date: 05/24/2015 OT Individual Time: 0700-0800 OT Individual Time Calculation (min): 60 min    Short Term Goals: Week 1:  OT Short Term Goal 1 (Week 1): STG= LTG  Skilled Therapeutic Interventions/Progress Updates:    Pt completed grooming tasks before leaving room.  Increased time to process through grooming tasks and toileting.  Increased time to locate washcloth on the right side of the shelf.  Once grooming tasks were completed had pt ambulate to the ADL kitchen to work on simple meal prep task.  Pt worked on making an egg including gathering items without cueing.  Pt with increased time needed to process the task with decreased organization throughout.  Pt needing min questioning cues to turn up the heat when asked what would make the egg cook faster.  Overall took at least 35 mins to complete task, which is about 2 times the normal expectation.  Pt aware that he demonstrates slower processing.  Pt returned to room at end of session.  Call button and phone within reach of bedside chair with safety belt in place.    Therapy Documentation Precautions:  Precautions Precautions: Fall Precaution Comments: R visual field deficits. Restrictions Weight Bearing Restrictions: No  Pain: No report of pain  ADL: See Function Navigator for Current Functional Status.   Therapy/Group: Individual Therapy  Sheelah Ritacco OTR/L 05/24/2015, 12:33 PM

## 2015-05-24 NOTE — Progress Notes (Signed)
Subjective/Complaints:  Poor appetite some reflux Pt with flat affect  Review of systems denies abdominal pain nausea vomiting or blood in stools, no breathing issues Objective: Vital Signs: Blood pressure 112/50, pulse 83, temperature 99.1 F (37.3 C), temperature source Oral, resp. rate 18, SpO2 97 %. Dg Abd 1 View  05/23/2015  CLINICAL DATA:  Heart attack and stroke EXAM: ABDOMEN - 1 VIEW COMPARISON:  None. FINDINGS: No disproportionate dilatation of bowel. No obvious free intraperitoneal gas. Unremarkable bony framework. Calcified granuloma is suspected in the right lobe of the liver. IMPRESSION: Nonobstructive bowel gas pattern. Electronically Signed   By: Marybelle Killings M.D.   On: 05/23/2015 15:20   Results for orders placed or performed during the hospital encounter of 05/18/15 (from the past 72 hour(s))  Urinalysis, Routine w reflex microscopic (not at [facility])     Status: Abnormal   Collection Time: 05/23/15  7:53 PM  Result Value Ref Range   Color, Urine AMBER (A) YELLOW    Comment: BIOCHEMICALS MAY BE AFFECTED BY COLOR   APPearance CLEAR CLEAR   Specific Gravity, Urine 1.027 1.005 - 1.030   pH 5.0 5.0 - 8.0   Glucose, UA NEGATIVE NEGATIVE mg/dL   Hgb urine dipstick NEGATIVE NEGATIVE   Bilirubin Urine SMALL (A) NEGATIVE   Ketones, ur 15 (A) NEGATIVE mg/dL   Protein, ur 30 (A) NEGATIVE mg/dL   Urobilinogen, UA 0.2 0.0 - 1.0 mg/dL   Nitrite NEGATIVE NEGATIVE   Leukocytes, UA NEGATIVE NEGATIVE  Urine microscopic-add on     Status: Abnormal   Collection Time: 05/23/15  7:53 PM  Result Value Ref Range   Squamous Epithelial / LPF RARE RARE   WBC, UA 0-2 <3 WBC/hpf   RBC / HPF 0-2 <3 RBC/hpf   Casts HYALINE CASTS (A) NEGATIVE   Urine-Other MUCOUS PRESENT   Basic metabolic panel     Status: Abnormal   Collection Time: 05/24/15  5:40 AM  Result Value Ref Range   Sodium 137 135 - 145 mmol/L   Potassium 3.8 3.5 - 5.1 mmol/L   Chloride 101 101 - 111 mmol/L   CO2 28 22 - 32  mmol/L   Glucose, Bld 108 (H) 65 - 99 mg/dL   BUN 11 6 - 20 mg/dL   Creatinine, Ser 0.94 0.61 - 1.24 mg/dL   Calcium 8.5 (L) 8.9 - 10.3 mg/dL   GFR calc non Af Amer >60 >60 mL/min   GFR calc Af Amer >60 >60 mL/min    Comment: (NOTE) The eGFR has been calculated using the CKD EPI equation. This calculation has not been validated in all clinical situations. eGFR's persistently <60 mL/min signify possible Chronic Kidney Disease.    Anion gap 8 5 - 15  CBC     Status: Abnormal   Collection Time: 05/24/15  5:40 AM  Result Value Ref Range   WBC 13.7 (H) 4.0 - 10.5 K/uL   RBC 2.86 (L) 4.22 - 5.81 MIL/uL   Hemoglobin 8.8 (L) 13.0 - 17.0 g/dL   HCT 27.2 (L) 39.0 - 52.0 %   MCV 95.1 78.0 - 100.0 fL   MCH 30.8 26.0 - 34.0 pg   MCHC 32.4 30.0 - 36.0 g/dL   RDW 13.2 11.5 - 15.5 %   Platelets 438 (H) 150 - 400 K/uL     HEENT: normal Cardio: RRR and no murmurs Resp: CTA B/L and unlabored GI: BS positive and nontender nondistended Extremity:  Pulses positive and No Edema Skin:  Other IV sites without erythema or tenderness right forearm does have bruising right forearm Neuro: Alert/Oriented, Normal Sensory, Normal Motor and Other right homonymous hemianopsia and R Neglect Musc/Skel:  Other no pain with range of motion Gen.  no acute distress   Assessment/Plan: 1. 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 Physiatrist is providing close team supervision and 24 hour management of active medical problems listed below. Physiatrist and rehab team continue to assess barriers to discharge/monitor patient progress toward functional and medical goals. Team conference today please see physician documentation under team conference tab, met with team face-to-face to discuss problems,progress, and goals. Formulized individual treatment plan based on  medical history, underlying problem and comorbidities. FIM: Function - Bathing Position: Standing at sink Body parts bathed by patient: Right arm, Chest, Abdomen, Front perineal area, Right upper leg, Left upper leg, Buttocks, Left arm, Right lower leg, Left lower leg, Back Body parts bathed by helper: Left arm, Buttocks, Right lower leg, Left lower leg, Back Assist Level: Touching or steadying assistance(Pt > 75%)  Function- Upper Body Dressing/Undressing What is the patient wearing?: Pull over shirt/dress Pull over shirt/dress - Perfomed by patient: Thread/unthread right sleeve, Thread/unthread left sleeve, Put head through opening, Pull shirt over trunk Assist Level: Touching or steadying assistance(Pt > 75%) Function - Lower Body Dressing/Undressing What is the patient wearing?: Pants, Underwear Underwear - Performed by patient: Thread/unthread right underwear leg, Thread/unthread left underwear leg, Pull underwear up/down Pants- Performed by patient: Thread/unthread right pants leg, Thread/unthread left pants leg, Pull pants up/down Non-skid slipper socks- Performed by patient: Don/doff right sock, Don/doff left sock Socks - Performed by patient: Don/doff right sock, Don/doff left sock Assist for footwear: Supervision/touching assist (per report) Assist for lower body dressing: Supervision or verbal cues  Function - Toileting Toileting steps completed by patient: Adjust clothing prior to toileting, Performs perineal hygiene, Adjust clothing after toileting Toileting Assistive Devices: Grab bar or rail Assist level: More than reasonable time  Function Midwife transfer assistive device: Grab bar Assist level to toilet: Supervision or verbal cues Assist level from toilet: Supervision or verbal cues  Function - Chair/bed transfer Chair/bed transfer method: Ambulatory Chair/bed transfer assist level: Touching or steadying assistance (Pt > 75%) Chair/bed transfer  details: Verbal cues for precautions/safety  Function - Locomotion: Wheelchair Will patient use wheelchair at discharge?: No Wheelchair activity did not occur: N/A Function - Locomotion: Ambulation Assistive device: No device Max distance: 150 Assist level: Supervision or verbal cues Assist level: Supervision or verbal cues Assist level: Supervision or verbal cues Assist level: Supervision or verbal cues Assist level: Touching or steadying assistance (Pt > 75%)  Function - Comprehension Comprehension: Auditory Comprehension assist level: Understands basic 90% of the time/cues < 10% of the time  Function - Expression Expression: Verbal Expression assist level: Expresses basic 75 - 89% of the time/requires cueing 10 - 24% of the time. Needs helper to occlude trach/needs to repeat words.  Function - Social Interaction Social Interaction assist level: Interacts appropriately 90% of the time - Needs monitoring or encouragement for participation or interaction.  Function - Problem Solving Problem solving assist level: Solves basic 75 - 89% of the time/requires cueing 10 - 24% of the time  Function - Memory Memory assist level: Recognizes or recalls 50 - 74% of the time/requires cueing 25 - 49% of the time Patient normally able to recall (  first 3 days only): Location of own room, Current season, That he or she is in a hospital   Medical Problem List and Plan: 1. Functional deficits secondary to left occipital CVA, Gait disturbance related to stroke, right homonymous hemianopsia, no significant hemiparesis 2.  DVT Prophylaxis/Anticoagulation: Pharmaceutical: Lovenox, reviewed 10/18 plt [id number] 3. Pain Management: Tylenol as needed, no specific pain issues currently      4. Mood: Monitor for signs of depression, some vegetative signs, neuro psych consult, add remoron for sleep mood and appetite5. Neuropsych: This patient Is capable of making decisions on His own behalf. 6. Skin/Wound  Care: Monitor for signs of skin breakdown 7. Fluids/Electrolytes/Nutrition: Intake and output, nutritional supplements as needed 8. CAD s/p STEMI: On ASA,Plavix 9.  LGIB:Will need follow-up with GI as an outpatient, we will monitor serial CBCs as well as stools, stable at 8.8 on 10/18 10. HTN:Was on Coreg, Lasix, Prinivil at home , restarted coreg on 10/13, HR and BP ok at 112/50, no need to restart prinivil at present, poor intake likely contributing to soft BPs check orthostatics 11.  Hypoalbuminemia- start beneprotein  LOS (Days) 6 A FACE TO FACE EVALUATION WAS PERFORMED  Sabria Florido E 05/24/2015, 9:08 AM

## 2015-05-25 ENCOUNTER — Encounter (HOSPITAL_COMMUNITY): Payer: Medicare Other | Admitting: Speech Pathology

## 2015-05-25 ENCOUNTER — Inpatient Hospital Stay (HOSPITAL_COMMUNITY): Payer: Medicare Other

## 2015-05-25 ENCOUNTER — Inpatient Hospital Stay (HOSPITAL_COMMUNITY): Payer: Medicare Other | Admitting: Occupational Therapy

## 2015-05-25 ENCOUNTER — Encounter (HOSPITAL_COMMUNITY): Payer: Medicare Other

## 2015-05-25 DIAGNOSIS — H53461 Homonymous bilateral field defects, right side: Secondary | ICD-10-CM

## 2015-05-25 DIAGNOSIS — I69398 Other sequelae of cerebral infarction: Secondary | ICD-10-CM

## 2015-05-25 DIAGNOSIS — Z8719 Personal history of other diseases of the digestive system: Secondary | ICD-10-CM

## 2015-05-25 DIAGNOSIS — R269 Unspecified abnormalities of gait and mobility: Secondary | ICD-10-CM

## 2015-05-25 DIAGNOSIS — I63532 Cerebral infarction due to unspecified occlusion or stenosis of left posterior cerebral artery: Secondary | ICD-10-CM

## 2015-05-25 MED ORDER — MEGESTROL ACETATE 400 MG/10ML PO SUSP
200.0000 mg | Freq: Three times a day (TID) | ORAL | Status: DC
  Administered 2015-05-25 – 2015-05-26 (×2): 200 mg via ORAL
  Filled 2015-05-25 (×5): qty 5

## 2015-05-25 NOTE — Progress Notes (Signed)
Occupational Therapy Discharge Summary  Patient Details  Name: Damon Walker MRN: [id number] Date of Birth: 1946-04-03  Today's Date: 05/25/2015 OT Individual Time: 1002-1057 OT Individual Time Calculation (min): 55 min   Session Note:  Pt worked on bathing and dressing with his spouse present.  Educated her on the types of cues he needs to help him complete basic selfcare tasks with modified independence.  He was able to gather all clothing and complete the shower with modified independence standing, using the grab bar for support.  She has already purchased a shower seat for him to use at home and plans to get a suction cup grab bar and hand held shower as well.  Educated her on the need to moving items that may clutter up the walking pathways at home, especially anything below waist level.    Patient has met 12 of 13 long term goals due to improved activity tolerance, improved balance, ability to compensate for deficits and improved attention.  Patient to discharge at overall Supervision level.  Patient's care partner is independent to provide the necessary physical and cognitive assistance at discharge.    Reasons goals not met: Pt did not attempt laundry management task this admission.  Recommendation:  Patient will benefit from ongoing skilled OT services in outpatient setting to continue to advance functional skills in the area of BADL, iADL and Reduce care partner burden.  Pt still demonstrates significant right visual field deficit and decreased selective attention, noted during selfcare tasks.  Family has been educated on safe assist and the need for a hand held shower and shower seat.  His wife will provide initial 24 hour supervision initially.  Feel he is modified independent in a controlled environment that is clutter free as his greatest deficit is the field cut and his decreased ability to compensate for it, with carryover.    Equipment: No equipment provided  Reasons for  discharge: treatment goals met and discharge from hospital  Patient/family agrees with progress made and goals achieved: Yes  OT Discharge Precautions/Restrictions  Precautions Precautions: Fall Precaution Comments: R visual field deficits.  Pain Pain Assessment Pain Assessment: No/denies pain ADL  See Function section of chart.    Vision/Perception  Vision- History Baseline Vision/History: Wears glasses Wears Glasses: At all times Patient Visual Report: Peripheral vision impairment Vision- Assessment Vision Assessment?: Yes Eye Alignment: Within Functional Limits Alignment/Gaze Preference: Within Defined Limits Visual Fields: Right homonymous hemianopsia  Cognition Overall Cognitive Status: Impaired/Different from baseline Arousal/Alertness: Awake/alert Orientation Level: Oriented X4 Attention: Sustained Sustained Attention: Appears intact Selective Attention: Impaired Selective Attention Impairment: Verbal basic;Functional basic Memory: Impaired Memory Impairment: Decreased recall of new information;Decreased short term memory Decreased Short Term Memory: Verbal complex Awareness: Impaired Awareness Impairment: Emergent impairment Problem Solving: Impaired Problem Solving Impairment: Verbal complex;Functional complex Executive Function: Organizing;Reasoning;Decision Making Reasoning: Appears intact Organizing: Impaired Organizing Impairment: Functional basic Decision Making: Impaired Decision Making Impairment: Functional basic Safety/Judgment: Impaired Comments: Pt with decreased selective attention, needs cueing to perform and complete selfcare tasks.  Greater amount of time required to complete selfcare as well.  Sensation Sensation Light Touch: Appears Intact Stereognosis: Appears Intact Hot/Cold: Appears Intact Proprioception: Appears Intact Coordination Gross Motor Movements are Fluid and Coordinated: Yes Fine Motor Movements are Fluid and Coordinated:  Yes Coordination and Movement Description: coordination WFLs in [place] Test: symmetrical Motor  Motor Motor: Within Functional Limits Mobility  Bed Mobility Bed Mobility: Supine to Sit Supine to Sit: 6: Modified independent (Device/Increase time) Transfers Transfers: Sit  to Stand;Stand to Sit Sit to Stand: 6: Modified independent (Device/Increase time) Stand to Sit: 6: Modified independent (Device/Increase time)  Trunk/Postural Assessment  Cervical Assessment Cervical Assessment: Within Functional Limits Thoracic Assessment Thoracic Assessment: Exceptions to [facility] (slight thoracic rounding) Lumbar Assessment Lumbar Assessment: Within Functional Limits  Balance Balance Balance Assessed: Yes Standardized Balance Assessment Standardized Balance Assessment: Berg Balance Test Berg Balance Test Sit to Stand: Able to stand without using hands and stabilize independently Standing Unsupported: Able to stand safely 2 minutes Sitting with Back Unsupported but Feet Supported on Floor or Stool: Able to sit safely and securely 2 minutes Stand to Sit: Sits safely with minimal use of hands Transfers: Able to transfer safely, minor use of hands Standing Unsupported with Eyes Closed: Able to stand 10 seconds with supervision Standing Ubsupported with Feet Together: Able to place feet together independently and stand for 1 minute with supervision From Standing, Reach Forward with Outstretched Arm: Reaches forward but needs supervision From Standing Position, Pick up Object from Floor: Able to pick up shoe, needs supervision From Standing Position, Turn to Look Behind Over each Shoulder: Looks behind from both sides and weight shifts well Turn 360 Degrees: Able to turn 360 degrees safely but slowly Standing Unsupported, Alternately Place Feet on Step/Stool: Able to complete >2 steps/needs minimal assist Standing Unsupported, One Foot in Front: Able to take small step independently and hold  30 seconds Standing on One Leg: Able to lift leg independently and hold 5-10 seconds Total Score: 42 Dynamic Sitting Balance Dynamic Sitting - Balance Activities: Lateral lean/weight shifting;Forward lean/weight shifting;Reaching for objects;Reaching across midline Dynamic Standing Balance Dynamic Standing - Balance Support: No upper extremity supported Dynamic Standing - Level of Assistance: 6: Modified independent (Device/Increase time) (during basic selfcare tasks) Extremity/Trunk Assessment RUE Assessment RUE Assessment: Within Functional Limits LUE Assessment LUE Assessment: Within Functional Limits   See Function Navigator for Current Functional Status.  Alyra Patty OTR/L 05/25/2015, 4:06 PM

## 2015-05-25 NOTE — Progress Notes (Signed)
Subjective/Complaints: Patient seen and examined this morning laying in bed. Patient states that he slept well overnight. He denies complaints this morning. However, after talking with nursing and patient's wife, patient has had a significant decrease in appetite. Remeron was started yesterday, however, he is only received 1 dose.  Review of systems denies CP, SOB, n/v/d.   Objective: Vital Signs: Blood pressure 108/53, pulse 81, temperature 99.6 F (37.6 C), temperature source Oral, resp. rate 18, weight 92.2 kg (203 lb 4.2 oz), SpO2 100 %. Dg Abd 1 View  05/23/2015  CLINICAL DATA:  Heart attack and stroke EXAM: ABDOMEN - 1 VIEW COMPARISON:  None. FINDINGS: No disproportionate dilatation of bowel. No obvious free intraperitoneal gas. Unremarkable bony framework. Calcified granuloma is suspected in the right lobe of the liver. IMPRESSION: Nonobstructive bowel gas pattern. Electronically Signed   By: Marybelle Killings M.D.   On: 05/23/2015 15:20   Results for orders placed or performed during the hospital encounter of 05/18/15 (from the past 72 hour(s))  Urinalysis, Routine w reflex microscopic (not at [facility])     Status: Abnormal   Collection Time: 05/23/15  7:53 PM  Result Value Ref Range   Color, Urine AMBER (A) YELLOW    Comment: BIOCHEMICALS MAY BE AFFECTED BY COLOR   APPearance CLEAR CLEAR   Specific Gravity, Urine 1.027 1.005 - 1.030   pH 5.0 5.0 - 8.0   Glucose, UA NEGATIVE NEGATIVE mg/dL   Hgb urine dipstick NEGATIVE NEGATIVE   Bilirubin Urine SMALL (A) NEGATIVE   Ketones, ur 15 (A) NEGATIVE mg/dL   Protein, ur 30 (A) NEGATIVE mg/dL   Urobilinogen, UA 0.2 0.0 - 1.0 mg/dL   Nitrite NEGATIVE NEGATIVE   Leukocytes, UA NEGATIVE NEGATIVE  Culture, Urine     Status: None   Collection Time: 05/23/15  7:53 PM  Result Value Ref Range   Specimen Description URINE, CLEAN CATCH    Special Requests NONE    Culture NO GROWTH 1 DAY    Report Status 05/24/2015 FINAL   Urine  microscopic-add on     Status: Abnormal   Collection Time: 05/23/15  7:53 PM  Result Value Ref Range   Squamous Epithelial / LPF RARE RARE   WBC, UA 0-2 <3 WBC/hpf   RBC / HPF 0-2 <3 RBC/hpf   Casts HYALINE CASTS (A) NEGATIVE   Urine-Other MUCOUS PRESENT   Basic metabolic panel     Status: Abnormal   Collection Time: 05/24/15  5:40 AM  Result Value Ref Range   Sodium 137 135 - 145 mmol/L   Potassium 3.8 3.5 - 5.1 mmol/L   Chloride 101 101 - 111 mmol/L   CO2 28 22 - 32 mmol/L   Glucose, Bld 108 (H) 65 - 99 mg/dL   BUN 11 6 - 20 mg/dL   Creatinine, Ser 0.94 0.61 - 1.24 mg/dL   Calcium 8.5 (L) 8.9 - 10.3 mg/dL   GFR calc non Af Amer >60 >60 mL/min   GFR calc Af Amer >60 >60 mL/min    Comment: (NOTE) The eGFR has been calculated using the CKD EPI equation. This calculation has not been validated in all clinical situations. eGFR's persistently <60 mL/min signify possible Chronic Kidney Disease.    Anion gap 8 5 - 15  CBC     Status: Abnormal   Collection Time: 05/24/15  5:40 AM  Result Value Ref Range   WBC 13.7 (H) 4.0 - 10.5 K/uL   RBC 2.86 (L) 4.22 - 5.81  MIL/uL   Hemoglobin 8.8 (L) 13.0 - 17.0 g/dL   HCT 27.2 (L) 39.0 - 52.0 %   MCV 95.1 78.0 - 100.0 fL   MCH 30.8 26.0 - 34.0 pg   MCHC 32.4 30.0 - 36.0 g/dL   RDW 13.2 11.5 - 15.5 %   Platelets 438 (H) 150 - 400 K/uL     HEENT: Normocephalic, atraumatic Cardio: RRR and no murmurs Resp: CTA B/L and unlabored GI: BS positive and nontender nondistended Skin:   bruising right forearm.  Warm and dry Neuro: Alert/Oriented, Normal sensation Musc/Skel:   No Edema No tenderness Gen.  no acute distress. Vital signs reviewed.   Assessment/Plan: 1. 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 Physiatrist is providing close team supervision and 24 hour management of active  medical problems listed below. Physiatrist and rehab team continue to assess barriers to discharge/monitor patient progress toward functional and medical goals. Team conference today please see physician documentation under team conference tab, met with team face-to-face to discuss problems,progress, and goals. Formulized individual treatment plan based on medical history, underlying problem and comorbidities. FIM: Function - Bathing Position: Shower Body parts bathed by patient: Right arm, Chest, Abdomen, Front perineal area, Right upper leg, Left upper leg, Buttocks, Left arm, Right lower leg, Left lower leg, Back Body parts bathed by helper: Left arm, Buttocks, Right lower leg, Left lower leg, Back Assist Level: More than reasonable time  Function- Upper Body Dressing/Undressing What is the patient wearing?: Pull over shirt/dress Pull over shirt/dress - Perfomed by patient: Thread/unthread right sleeve, Thread/unthread left sleeve, Put head through opening, Pull shirt over trunk Assist Level: More than reasonable time Function - Lower Body Dressing/Undressing What is the patient wearing?: Socks, Shoes, Pants, Underwear Position: Sitting EOB Underwear - Performed by patient: Thread/unthread right underwear leg, Thread/unthread left underwear leg, Pull underwear up/down Pants- Performed by patient: Fasten/unfasten pants, Pull pants up/down, Thread/unthread left pants leg, Thread/unthread right pants leg Non-skid slipper socks- Performed by patient: Don/doff right sock, Don/doff left sock Socks - Performed by patient: Don/doff right sock, Don/doff left sock Shoes - Performed by patient: Don/doff right shoe, Don/doff left shoe, Fasten right, Fasten left Assist for footwear: Supervision/touching assist (per report) Assist for lower body dressing: More than reasonable time  Function - Toileting Toileting steps completed by patient: Adjust clothing prior to toileting, Performs perineal hygiene,  Adjust clothing after toileting Toileting Assistive Devices: Grab bar or rail Assist level: More than reasonable time  Function - Air cabin crew transfer assistive device: Grab bar Assist level to toilet: No Help, no cues, assistive device, takes more than a reasonable amount of time Assist level from toilet: No Help, no cues, assistive device, takes more than a reasonable amount of time  Function - Chair/bed transfer Chair/bed transfer method: Ambulatory Chair/bed transfer assist level: Supervision or verbal cues Chair/bed transfer details: Verbal cues for precautions/safety  Function - Locomotion: Wheelchair Will patient use wheelchair at discharge?: No Wheelchair activity did not occur: N/A Function - Locomotion: Ambulation Assistive device: No device Max distance: 150 Assist level: Supervision or verbal cues Assist level: Supervision or verbal cues Assist level: Supervision or verbal cues Assist level: Supervision or verbal cues Assist level: Touching or steadying assistance (Pt > 75%)  Function - Comprehension Comprehension: Auditory Comprehension assist level: Understands basic 90% of the time/cues < 10% of the time  Function - Expression Expression:  Verbal Expression assist level: Expresses basic 75 - 89% of the time/requires cueing 10 - 24% of the time. Needs helper to occlude trach/needs to repeat words.  Function - Social Interaction Social Interaction assist level: Interacts appropriately 90% of the time - Needs monitoring or encouragement for participation or interaction.  Function - Problem Solving Problem solving assist level: Solves basic 75 - 89% of the time/requires cueing 10 - 24% of the time  Function - Memory Memory assist level: Recognizes or recalls 50 - 74% of the time/requires cueing 25 - 49% of the time Patient normally able to recall (first 3 days only): Location of own room, Current season, That he or she is in a hospital   Medical  Problem List and Plan: 1. Functional deficits secondary to left occipital CVA, Gait disturbance related to stroke, right homonymous hemianopsia, no significant hemiparesis 2.  DVT Prophylaxis/Anticoagulation: Pharmaceutical: Lovenox, reviewed 10/19 plt 438K 3. Pain Management: Tylenol as needed, no specific pain issues currently       4. Mood: Monitor for signs of depression, some vegetative signs, neuro psych consultPatient may have some component of depression given recent events. Will continue to encourage patient and have him follow-up as outpatient 5. Neuropsych: This patient Is capable of making decisions on His own behalf. 6. Skin/Wound Care: Monitor for signs of skin breakdown 7. Fluids/Electrolytes/Nutrition: Intake and output, nutritional supplements as needed 8. CAD s/p STEMI: On ASA,Plavix 9.  LGIB:Will need follow-up with GI as an outpatient, we will monitor serial CBCs as well as stools, stable at 8.8 on 10/18 10. HTN:Was on Coreg, Lasix, Prinivil at home , restarted coreg on 10/13, HR and BP ok at 112/50, no need to restart prinivil at present, poor intake likely contributing to soft BPs check orthostatics 11.  Hypoalbuminemia- start beneprotein 12. Poor appetite: Rremoron added for appetite on 10/19.  We will also add Megace today.  LOS (Days) 7 A FACE TO FACE EVALUATION WAS PERFORMED  Damon Walker Lorie Phenix 05/25/2015, 1:08 PM

## 2015-05-25 NOTE — Plan of Care (Signed)
Problem: RH Awareness Goal: LTG: Patient will demonstrate intellectual/emergent (SLP) LTG: Patient will demonstrate intellectual/emergent/anticipatory awareness with assist during a cognitive/linguistic activity (SLP)  Outcome: Adequate for Discharge Pt discharging home with 24/7 supervision, family able to provide recommended level of assistance

## 2015-05-25 NOTE — Plan of Care (Signed)
Problem: RH Vision Goal: RH LTG Vision (Specify) Outcome: Not Met (add Reason) Needs cueing for right visual field cut

## 2015-05-25 NOTE — Progress Notes (Signed)
Speech Language Pathology Discharge Summary  Patient Details  Name: Damon Walker MRN: [id number] Date of Birth: [birth year]  Today's Date: 05/25/2015 SLP Individual Time: 1103-1200 SLP Individual Time Calculation (min): 57 min   Skilled Therapeutic Interventions:  Pt was seen for skilled ST targeting grad day activities and completion of family education prior to discharge.  SLP facilitated the session with a basic, new learning activity/card game targeting attention to task, visual scanning to the right, and functional problem solving.  Pt demonstrated significantly improved sustained attention to task but required cues for selective attention to task in the presence of environmental distractions.  As a result of his improved sustained attention, pt also demonstrated improved working memory for recall of task protocol and procedures with fewer cues needed.  Pt located matches of targeted cards from a field of 9 and completed mental math calculations with overall min assist verbal cues for ~75% accuracy. Pt's daughter, son, and wife were also present during today's therapy for training prior to discharge home tomorrow.  SLP provided handouts and skilled education regarding compensatory strategies for cognition, including distraction management techniques and memory strategies.  SLP recommended that pt have assistance for medication and financial management at discharge due to cognitive deficits in addition to ST follow up at next level of care.  Pt's family verbalized understanding and were in agreement with the abovementioned recommendations.  Pt was left in armchair with family present.  All questions were answered to their satisfaction at this time.      Patient has met 3 of 4 long term goals.  Patient to discharge at [facility] level.  Reasons goals not met: goals not met adequate for discharge as pt's family is able to provide recommended level of assistance    Clinical Impression/Discharge  Summary:  Pt made functional gains while inpatient and is discharging having met 3 out of 4 long term goals.  Pt currently requires min assist to complete basic cognitive tasks due to right inattention, decreased selective attention to tasks, decreased recall of new information and short term memory, and decreased functional problem solving.  Pt is discharging home with 24/7 supervision from family.  SLP recommends ongoing ST follow up at next level of care to continue to address cognitive deficits in order to maximize pt's cognitive remediation/compensation.  Additionally, recommend that pt have assistance for medication and financial management until his mentation clears. Pt and family education is complete at this time.    Care Partner:  Caregiver Able to Provide Assistance: Yes  Type of Caregiver Assistance: Physical;Cognitive  Recommendation:  Home Health SLP;Outpatient SLP;24 hour supervision/assistance  Rationale for SLP Follow Up: Maximize cognitive function and independence;Reduce caregiver burden   Equipment: none recommended by SLP    Reasons for discharge: Discharged from hospital   Patient/Family Agrees with Progress Made and Goals Achieved: Yes   Function:  Eating Eating             Cognition Comprehension Comprehension assist level: Follows basic conversation/direction with extra time/assistive device  Expression   Expression assist level: Expresses basic needs/ideas: With extra time/assistive device  Social Interaction Social Interaction assist level: Interacts appropriately 90% of the time - Needs monitoring or encouragement for participation or interaction.  Problem Solving Problem solving assist level: Solves basic 75 - 89% of the time/requires cueing 10 - 24% of the time  Memory Memory assist level: Recognizes or recalls 75 - 89% of the time/requires cueing 10 - 24% of the time   Windell Moulding  L 05/25/2015, 3:45 PM

## 2015-05-25 NOTE — Progress Notes (Signed)
Physical Therapy Discharge Summary  Patient Details  Name: Damon Walker MRN: [id number] Date of Birth: February 10, 1946  Today's Date: 05/25/2015 PT Individual Time: 8850-2774 PT Individual Time Calculation (min): 90 min    Patient has met 6 of 7 long term goals due to improved activity tolerance, improved balance, functional use of  right upper extremity and right lower extremity, improved attention, improved awareness and improved coordination.  Patient to discharge at an ambulatory level Supervision.   Patient's care partner is independent to provide the necessary cognitive assistance at discharge.  Reasons goals not met: pt required supervision for car transfer due to safety awareness deficits  Recommendation:  Patient will benefit from ongoing skilled PT services in home health setting to continue to advance safe functional mobility, address ongoing impairments in high level balance, activity tolerance, coordination, memory, awareness, R neglect, and minimize fall risk.  Equipment: No equipment provided  Reasons for discharge: treatment goals met and discharge from hospital  Patient/family agrees with progress made and goals achieved: Yes  PT Discharge  tx today: pt c/o blurry vision; see BP below.  PT recommended they discuss with PA.  Family ed with wife; son and daughter observed.  Wife return- demonstrated safe guarding of pt on his R during gait on level ground, up/down 12 corner steps, floor transfer to floor mat for fall recovery, simulated car transfer to sedan height seat, and performance of [place] A exs with 2# ankle wt LLE only.  HEP hand out provided.  PT discussed fall risks given new Berg score of 42/56.  PT recommended pt go to Cardiac Rehab as soon as he is cleared for this after CVA.  Pt required frequent cues to look R for people and obstacles in his environment.  Pt tends to "shut down" by closing his eyes, frequently during tx, despite many seated rest breaks throughout  session.  Gait training up/down ramp with supervision, over curb and over uneven ground with min assist 1st trials, improving to supervision on 2nd trials.  Memory is quite impaired; pt had no recollection of doing [place] yesterday. Wife ambulated pt back to room.  Precautions/Restrictions Precautions Precautions: Fall Precaution Comments: R visual field deficits. Restrictions Weight Bearing Restrictions: No Vital Signs Therapy Vitals Pulse Rate: 86 Resp: 17 BP: (!) 116/52 mmHg Patient Position sitting Pain Pain Assessment Pain Assessment: No/denies pain Vision/Perception  Vision - Assessment Eye Alignment: Within Functional Limits Alignment/Gaze Preference: Within Defined Limits  C/o blurry vision today. Cognition Overall Cognitive Status: Impaired/Different from baseline Arousal/Alertness: Awake/alert Orientation Level: Oriented X4 Attention: Sustained Sustained Attention: Appears intact Selective Attention: Impaired Selective Attention Impairment: Verbal basic;Functional basic Memory: Impaired Memory Impairment: Decreased recall of new information;Decreased short term memory (pt had no recall of doing [place] A exs yesterday) Decreased Short Term Memory: Verbal complex Awareness: Impaired Awareness Impairment: Anticipatory impairment Problem Solving: Impaired Problem Solving Impairment: Verbal complex;Functional complex Executive Function: Organizing;Reasoning;Decision Making Reasoning: Appears intact Organizing: Impaired Organizing Impairment: Functional basic Decision Making: Impaired Decision Making Impairment: Functional basic Safety/Judgment: Impaired Comments: Pt with decreased selective attention, needs cueing to perform and complete selfcare tasks.  Greater amount of time required to complete selfcare as well.  Sensation Sensation Light Touch: Appears Intact Stereognosis: Appears Intact Hot/Cold: Appears Intact Proprioception: Appears  Intact Coordination Gross Motor Movements are Fluid and Coordinated: Yes Fine Motor Movements are Fluid and Coordinated: No Coordination and Movement Description: laternating movements slightly diminished, but improved since admission Heel Shin Test: symmetrical Motor  Motor Motor: Within Functional Limits  Motor - Discharge Observations: improved coordination since admission  Mobility Bed Mobility Bed Mobility:  (modified independent for all) Supine to Sit: 6: Modified independent (Device/Increase time) Transfers Transfers: Yes Sit to Stand: 6: Modified independent (Device/Increase time) Stand to Sit: 6: Modified independent (Device/Increase time) Stand Pivot Transfers: 5: Supervision Stand Pivot Transfer Details: Verbal cues for precautions/safety (due to R field cut) Locomotion  Ambulation Ambulation: Yes Ambulation/Gait Assistance: 5: Supervision Ambulation Distance (Feet): 250 Feet Assistive device: None Ambulation/Gait Assistance Details: Verbal cues for precautions/safety Gait Gait: Yes Gait Pattern: Impaired Gait Pattern: Decreased trunk rotation;Step-through pattern Stairs / Additional Locomotion Stairs: Yes Stairs Assistance: 5: Supervision Stair Management Technique: One rail Right Number of Stairs: 12 Height of Stairs: 7 (and some 3" high) Ramp: 5: Supervision Curb: 5: Building services engineer Mobility: No  Trunk/Postural Assessment  Cervical Assessment Cervical Assessment: Within Functional Limits Thoracic Assessment Thoracic Assessment: Exceptions to [facility] (slight thoracic rounding) Lumbar Assessment Lumbar Assessment: Within Functional Limits Postural Control Righting Reactions: pt demonstrates bil ankle, bil hip, delayed stepping strategies when balance is disturbed externally  Balance Balance Balance Assessed: Yes Standardized Balance Assessment Standardized Balance Assessment: Berg Balance Test Berg Balance Test Sit to Stand:  Able to stand without using hands and stabilize independently Standing Unsupported: Able to stand safely 2 minutes Sitting with Back Unsupported but Feet Supported on Floor or Stool: Able to sit safely and securely 2 minutes Stand to Sit: Sits safely with minimal use of hands Transfers: Able to transfer safely, minor use of hands Standing Unsupported with Eyes Closed: Able to stand 10 seconds with supervision Standing Ubsupported with Feet Together: Able to place feet together independently and stand for 1 minute with supervision From Standing, Reach Forward with Outstretched Arm: Reaches forward but needs supervision From Standing Position, Pick up Object from Floor: Able to pick up shoe, needs supervision From Standing Position, Turn to Look Behind Over each Shoulder: Looks behind from both sides and weight shifts well Turn 360 Degrees: Able to turn 360 degrees safely but slowly Standing Unsupported, Alternately Place Feet on Step/Stool: Able to complete >2 steps/needs minimal assist Standing Unsupported, One Foot in Front: Able to take small step independently and hold 30 seconds Standing on One Leg: Able to lift leg independently and hold 5-10 seconds Total Score: 42 Dynamic Sitting Balance Dynamic Sitting - Balance Activities: Lateral lean/weight shifting;Forward lean/weight shifting;Reaching for objects;Reaching across midline Dynamic Standing Balance Dynamic Standing - Balance Support: No upper extremity supported Dynamic Standing - Level of Assistance: 6: Modified independent (Device/Increase time) (during basic selfcare tasks) Extremity Assessment  RUE Assessment RUE Assessment: Within Functional Limits LUE Assessment LUE Assessment: Within Functional Limits RLE Assessment RLE Assessment: Exceptions to [facility] RLE Strength RLE Overall Strength Comments: grossly in sitting: 4+/5 hip and knee; 4/5 ankle DF/PF LLE Assessment LLE Assessment: Within Functional Limits   See Function  Navigator for Current Functional Status.  Blayne Garlick 05/25/2015, 5:12 PM

## 2015-05-26 ENCOUNTER — Telehealth: Payer: Self-pay

## 2015-05-26 LAB — CBC WITH DIFFERENTIAL/PLATELET
BASOS PCT: 1 %
Basophils Absolute: 0.1 10*3/uL (ref 0.0–0.1)
Eosinophils Absolute: 0.5 10*3/uL (ref 0.0–0.7)
Eosinophils Relative: 5 %
HEMATOCRIT: 27.1 % — AB (ref 39.0–52.0)
HEMOGLOBIN: 8.6 g/dL — AB (ref 13.0–17.0)
LYMPHS PCT: 30 %
Lymphs Abs: 3 10*3/uL (ref 0.7–4.0)
MCH: 30 pg (ref 26.0–34.0)
MCHC: 31.7 g/dL (ref 30.0–36.0)
MCV: 94.4 fL (ref 78.0–100.0)
MONOS PCT: 8 %
Monocytes Absolute: 0.8 10*3/uL (ref 0.1–1.0)
NEUTROS ABS: 5.8 10*3/uL (ref 1.7–7.7)
NEUTROS PCT: 56 %
Platelets: 455 10*3/uL — ABNORMAL HIGH (ref 150–400)
RBC: 2.87 MIL/uL — ABNORMAL LOW (ref 4.22–5.81)
RDW: 13.2 % (ref 11.5–15.5)
WBC: 10.2 10*3/uL (ref 4.0–10.5)

## 2015-05-26 MED ORDER — CLOPIDOGREL BISULFATE 75 MG PO TABS: 75.0000 mg | ORAL_TABLET | Freq: Every day | ORAL | Status: DC

## 2015-05-26 MED ORDER — MIRTAZAPINE 15 MG PO TBDP: 15.0000 mg | ORAL_TABLET | Freq: Every day | ORAL | Status: DC

## 2015-05-26 MED ORDER — MEGESTROL ACETATE 400 MG/10ML PO SUSP: 200.0000 mg | Freq: Three times a day (TID) | ORAL | Status: DC

## 2015-05-26 MED ORDER — ENSURE ENLIVE PO LIQD: 237.0000 mL | Freq: Three times a day (TID) | ORAL | Status: DC

## 2015-05-26 MED ORDER — ASPIRIN EC 81 MG PO TBEC: 81.0000 mg | DELAYED_RELEASE_TABLET | Freq: Every day | ORAL | Status: DC

## 2015-05-26 NOTE — Progress Notes (Signed)
Social Work Discharge Note  The overall goal for the admission was met for:   Discharge location: Yes - home with family  Length of Stay: Yes - 8 days  Discharge activity level: Yes - Supervision  Home/community participation: Yes  Services provided included: MD, RD, PT, OT, SLP, RN, Pharmacy, Neuropsych and SW  Financial Services: Private Insurance: [employer]  Follow-up services arranged: Home Health: PT/OT/ST/RN from [facility] and Patient/Family has no preference for HH/DME agencies  Comments (or additional information):  Pt to d/c home with home health and wife to provide 24/7 supervision.    Patient/Family verbalized understanding of follow-up arrangements: Yes  Individual responsible for coordination of the follow-up plan: pt's wife with support from his son and dtr (from [place])  Confirmed correct DME delivered: Blakley Michna, Silvestre Mesi 05/26/2015    Kole Hilyard, Silvestre Mesi

## 2015-05-26 NOTE — Discharge Summary (Signed)
Physician Discharge Summary  Patient ID: Damon Walker MRN: [id number] DOB/AGE: 1945/08/24 69 y.o.  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   Significant Diagnostic Studies: Ct Angio Head W/cm &/or Wo Cm  05/15/2015  CLINICAL DATA:  24 hour follow-up for stroke with tPA. EXAM: CT ANGIOGRAPHY HEAD AND NECK TECHNIQUE: Multidetector CT imaging of the head and neck was performed using the standard protocol during bolus administration of intravenous contrast. Multiplanar CT image reconstructions and MIPs were obtained to evaluate the vascular anatomy. Carotid stenosis measurements (when applicable) are obtained utilizing NASCET criteria, using the distal internal carotid diameter as the denominator. CONTRAST:  79mL OMNIPAQUE IOHEXOL 350 MG/ML SOLN COMPARISON:  Head CT from yesterday FINDINGS: CT HEAD Brain: Cytotoxic edema in the left occipital lobe without hemorrhagic conversion. 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 Chronic small vessel ischemic changes throughout the bilateral cerebral white matter, mild for age. Calvarium and skull base: No fracture or destructive process noted. Paranasal sinuses: Opacification of the under aerated bilateral mastoids and middle ears. No gross erosive changes. Orbits: Left cataract resection.  No acute finding. CTA NECK Aortic arch: Remarkably extensive and irregular noncalcified plaque on the aortic arch. No visualized aneurysm or dissection. Three vessel branching. Right carotid system: Diffuse predominantly noncalcified atherosclerosis with plaque irregularity along the posterior wall of the proximal ICA. Stenosis measures less than 50%. Left  carotid system: Extensive noncalcified atheromatous plaque with irregularity at the bifurcation. Stenosis approaches 50% at the proximal ICA. Moderate tortuosity of the ICA at the skullbase. Vertebral arteries:Atheromatous but sufficiently patent subclavian arteries. Strong left vertebral artery dominance. Nonvisualized right V1 segment with symmetrically enhancing right V4 segment and graded underfilling and relative hypoenhancement proximally. Intermittent atherosclerotic plaque of the left vertebral artery without flow limiting stenosis. Skeleton: No aggressive lytic or blastic process noted. Other neck: No incidentally detected mass or adenopathy. Trace left pleural effusion posteriorly. CTA HEAD Anterior circulation: Atherosclerosis of the carotid siphons without flow limiting stenosis. 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 No major branch occlusion or treatable flow limiting stenosis. Posterior circulation: Strongly dominant left vertebral artery. Dysplastic distal basilar with the left superior cerebellar artery from the left P1 segment. Mild atheromatous narrowing and irregularity of the left P1 segment. No treatable flow limiting stenosis. No proximal left PCA branch occlusion to suggest additional tissue at risk. Venous sinuses: Patent Anatomic variants: Left superior cerebellar artery from the P1 segment. Intact anterior communicating artery. Delayed phase: No parenchymal enhancement IMPRESSION: 1. Acute left PCA branch infarct involving the occipital lobe. No hemorrhagic conversion post tPA. 2. Poor flow in the non dominant right vertebral artery, favor high-grade proximal stenosis over dissection. 3. Mild to moderate left P1 segment stenosis. No treatable flow limiting intracranial stenosis. 4. Extensive atherosclerosis, predominately extracranial. Proximal left ICA stenosis approaching 50%. 5. 3 mm left paraclinoid ICA  aneurysm. Electronically Signed   By: Monte Fantasia M.D.   On: 05/15/2015 15:12   Dg Chest 1 View  05/11/2015  CLINICAL DATA:  Myocardial infarction.  History of smoking. EXAM: CHEST 1 VIEW COMPARISON:  Chest radiograph 12/14/2014 FINDINGS: Increased interstitial densities throughout both lungs. Findings are suggestive for interstitial pulmonary edema. The  heart size is normal. Trachea is midline. Negative for a pneumothorax. No large pleural effusions. IMPRESSION: Increased interstitial densities in both lungs. Findings are suggestive for interstitial pulmonary edema. Electronically Signed   By: Markus Daft M.D.   On: 05/11/2015 09:36   Dg Abd 1 View  05/23/2015  CLINICAL DATA:  Heart attack and stroke EXAM: ABDOMEN - 1 VIEW COMPARISON:  None. FINDINGS: No disproportionate dilatation of bowel. No obvious free intraperitoneal gas. Unremarkable bony framework. Calcified granuloma is suspected in the right lobe of the liver. IMPRESSION: Nonobstructive bowel gas pattern. Electronically Signed   By: Marybelle Killings M.D.   On: 05/23/2015 15:20   Ct Head Wo Contrast  05/14/2015  CLINICAL DATA:  Code stroke. Heart catheterization with subsequent onset of headache and bilateral blurred vision EXAM: CT HEAD WITHOUT CONTRAST TECHNIQUE: Contiguous axial images were obtained from the base of the skull through the vertex without intravenous contrast. COMPARISON:  06/22/2014 FINDINGS: No sign of acute infarction by CT. 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 This is not acute. No hydrocephalus. No hemorrhage. No mass lesion. No calvarial abnormality of an acute nature. Sinuses are clear. There is fluid in the middle ears bilaterally. IMPRESSION: No acute finding by CT. 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 These results were called by telephone at the time of interpretation on 05/14/2015 at 2:36 pm to Dr. Orlie Dakin, who verbally acknowledged these results. Electronically Signed   By: Nelson Chimes M.D.   On: 05/14/2015 14:39   Ct Angio Neck W/cm &/or Wo/cm  05/15/2015  CLINICAL DATA:  24 hour follow-up for stroke with tPA. EXAM: CT ANGIOGRAPHY HEAD AND NECK TECHNIQUE: Multidetector CT imaging of the head and neck was performed using the standard protocol during bolus administration of intravenous contrast. Multiplanar CT image reconstructions and MIPs were obtained to evaluate the vascular anatomy. Carotid stenosis measurements (when applicable) are obtained utilizing NASCET criteria, using the distal internal carotid diameter as the denominator. CONTRAST:  50mL OMNIPAQUE IOHEXOL 350 MG/ML SOLN COMPARISON:  Head CT from yesterday FINDINGS: CT HEAD Brain: Cytotoxic edema in the left occipital lobe without hemorrhagic conversion. 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 Chronic small vessel ischemic changes throughout the bilateral cerebral white matter, mild for age. Calvarium and skull base: No fracture or destructive process noted. Paranasal sinuses: Opacification of the under aerated bilateral mastoids and middle ears. No gross erosive changes. Orbits: Left cataract resection.  No acute finding. CTA NECK Aortic arch: Remarkably extensive and irregular noncalcified plaque on the aortic arch. No visualized aneurysm or dissection. Three vessel branching. Right carotid system: Diffuse predominantly noncalcified atherosclerosis with plaque irregularity along the posterior wall of the proximal ICA. Stenosis measures less than 50%. Left carotid system: Extensive noncalcified atheromatous plaque with irregularity at the bifurcation. Stenosis approaches 50% at the  proximal ICA. Moderate tortuosity of the ICA at the skullbase. Vertebral arteries:Atheromatous but sufficiently patent subclavian arteries. Strong left vertebral artery dominance. Nonvisualized right V1 segment with symmetrically enhancing right V4 segment and graded underfilling and relative hypoenhancement proximally. Intermittent atherosclerotic plaque of the left vertebral artery without flow limiting stenosis. Skeleton: No aggressive lytic or blastic process noted. Other neck: No incidentally  detected mass or adenopathy. Trace left pleural effusion posteriorly. CTA HEAD Anterior circulation: Atherosclerosis of the carotid siphons without flow limiting stenosis. 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 No major branch occlusion or treatable flow limiting stenosis. Posterior circulation: Strongly dominant left vertebral artery. Dysplastic distal basilar with the left superior cerebellar artery from the left P1 segment. Mild atheromatous narrowing and irregularity of the left P1 segment. No treatable flow limiting stenosis. No proximal left PCA branch occlusion to suggest additional tissue at risk. Venous sinuses: Patent Anatomic variants: Left superior cerebellar artery from the P1 segment. Intact anterior communicating artery. Delayed phase: No parenchymal enhancement IMPRESSION: 1. Acute left PCA branch infarct involving the occipital lobe. No hemorrhagic conversion post tPA. 2. Poor flow in the non dominant right vertebral artery, favor high-grade proximal stenosis over dissection. 3. Mild to moderate left P1 segment stenosis. No treatable flow limiting intracranial stenosis. 4. Extensive atherosclerosis, predominately extracranial. Proximal left ICA stenosis approaching 50%. 5. 3 mm left paraclinoid ICA aneurysm. Electronically Signed   By: Monte Fantasia M.D.   On: 05/15/2015 15:12   Mr Brain Wo Contrast  05/15/2015  CLINICAL  DATA:  Stroke. Hypertension, hyperlipidemia, pre diabetes. LAD stenting 05/11/2015 EXAM: MRI HEAD WITHOUT CONTRAST TECHNIQUE: Multiplanar, multiecho pulse sequences of the brain and surrounding structures were obtained without intravenous contrast. COMPARISON:  CT head 05/15/2015 FINDINGS: Acute left PCA infarct involving the left occipital cortex. No other acute infarct Encephalomalacia in the right temporal and frontal lobe. This is chronic and unchanged from prior studies. Adjacent brain appears normal without cortical encephalomalacia. There is a membrane through the encephalomalacia which may be the arachnoid. No associated hemorrhage. This could be an in utero infarct. Chronic microvascular ischemic change in the white matter. Chronic infarct in the left pons. Negative for hemorrhage or mass lesion.  Negative for hydrocephalus. IMPRESSION: Acute infarct left occipital lobe. Chronic microvascular ischemic changes in the white matter and left pons Encephalomalacia right temporal and frontal lobe is chronic and unchanged. This may be due to in utero infarct. Electronically Signed   By: Franchot Gallo M.D.   On: 05/15/2015 16:56    Labs:  Basic Metabolic Panel:  Recent Labs Lab 05/24/15 0540  NA 137  K 3.8  CL 101  CO2 28  GLUCOSE 108*  BUN 11  CREATININE 0.94  CALCIUM 8.5*    CBC:  Recent Labs Lab 05/24/15 0540 05/26/15 0408  WBC 13.7* 10.2  NEUTROABS  --  5.8  HGB 8.8* 8.6*  HCT 27.2* 27.1*  MCV 95.1 94.4  PLT 438* 455*     CBG: No results for input(s): GLUCAP in the last 168 hours.  Brief HPI:   Damon Walker is a 69 y.o. RH-male with history of HTN, prediabetes, CAD with ICM s/p cath with stenting for STEMI on 05/11/15. He was discharged to home on 10/09 am and developed sudden onset of confusion with right visual field deficits after lunch. He was re-admitted for work up and CCT negative for acute changes. NIHSS 4 and he was treated with IV tPA due to suspicion of  L-PCA infarct. He did develop rectal bleeding with ABLA and was transfused with FFP. Serial H/H recommended by Dr. Cristina Gong.  MRI brain done revealing acute left occipital lobe infarct and Dr. Erlinda Hong felt that  L-PCA infarct cardio-embolic  due to low EF and recent MI. Repeat 2D echo with EF 35-40% with moderate hypokinesis of mid-apical  anterior and lateral septal myocardium.  Cardiology recommended changing Brilinta to Plavix to keep stents open. Patient underwent sigmoidoscopy 10/13 revealing small internal hemorrhoids and two prior biopsy sites without evidence of bleeding. Patient with resultant confusion, tendency to drift to the left and cognitive deficits as well as denial of visual deficits. CIR was recommended for follow up therapy.   Hospital Course: Damon Walker was admitted to rehab 05/18/2015 for inpatient therapies to consist of PT, ST and OT at least three hours five days a week. Past admission physiatrist, therapy team and rehab RN have worked together to provide customized collaborative inpatient rehab. Patient was maintained on plavix during his stay. Serial CBC shows that H/H is stable and no recurrent episodes of rectal bleeding reported. Reactive leucocytosis is resolving and he has been afebrile during his rehab stay.  Blood pressures have been reasonably controlled with resumption of coreg. Po intake has been poor despite encouragement and multiple supplements that were offered during the day. Family has also been providing additional food from home.  Repeat lytes on 10/19 shows renal status is stable despite variable intake.  Neuropsychology was consulted for coping due to adjustment reaction but patient declined this service.  He was started on Remeron to help with appetite as well as mood and this can be further adjusted by PMD.  Megace was also added 10/20 to help with appetite and family was give Rx to fill if appetite did not improve after discharge to home.  Patient has made good  gains in functional mobility but continues to be limited by visual deficits. He requires 24 hours supervision due to cognitive deficits and safety concerns. He will continue to receive follow up HHPT,HHOT. HHRN and HHST by  [place].    Rehab course: During patient's stay in rehab weekly team conferences were held to monitor patient's progress, set goals and discuss barriers to discharge. At admission, patient required min assist with ADL tasks as well as mobility.  He had mild to moderate cognitive deficits with poor attention, problems with memory storage as well as decreased intellectual awareness of deficits. He has had improvement in activity tolerance, balance, postural control, as well as ability to compensate for deficits. He requires cues to complete ADL tasks at modified independent level. He is modified independent for tranfers and requires supervision to ambulate 250 feet without AD and cues for safety.  He requires min assist for basic cognitive tasks due to decreased attention, decrease recall, decrease in functional problem solving as well as right inattention.   Family education was done with wife and son regarding cognitive cues as well as safety and supervision needed after discharge.    Disposition: 01-Home or Self Care  Diet:  Heart Healthy.   Special Instructions: 1. Continue HEP daily.  2. Stay out of bed during the day.  3. Needs 24 hours supervision. 4.  No driving.    Discharge Instructions    Ambulatory referral to Physical Medicine Rehab    Complete by:  As directed   Follow up stroke            Medication List    STOP taking these medications        aspirin 81 MG chewable tablet  Replaced by:  aspirin EC 81 MG tablet     furosemide 20 MG tablet  Commonly known as:  LASIX     lisinopril 5 MG tablet  Commonly known as:  PRINIVIL,ZESTRIL     ticagrelor 90 MG  Tabs tablet  Commonly known as:  BRILINTA      TAKE these medications         aspirin EC 81 MG tablet  Take 1 tablet (81 mg total) by mouth daily.     carvedilol 3.125 MG tablet  Commonly known as:  COREG  Take 3 tablets (9.375 mg total) by mouth 2 (two) times daily with a meal.  Notes to Patient:  For your heart--take with breakfast and supper     clopidogrel 75 MG tablet  Commonly known as:  PLAVIX  Take 1 tablet (75 mg total) by mouth daily.  Notes to Patient:  This is the new blood thinner.      feeding supplement (ENSURE ENLIVE) Liqd  Take 237 mLs by mouth 3 (three) times daily between meals.     megestrol 400 MG/10ML suspension  Commonly known as:  MEGACE  Take 5 mLs (200 mg total) by mouth 3 (three) times daily before meals. To help with appetite  Notes to Patient:  Do not fill this prescription yet as very expensive.      mirtazapine 15 MG disintegrating tablet  Commonly known as:  REMERON SOL-TAB  Take 1 tablet (15 mg total) by mouth at bedtime.  Notes to Patient:  Will help with mood, insomnia and appetite     nitroGLYCERIN 0.4 MG SL tablet  Commonly known as:  NITROSTAT  Place 1 tablet (0.4 mg total) under the tongue every 5 (five) minutes x 3 doses as needed for chest pain.     pantoprazole 40 MG tablet  Commonly known as:  PROTONIX  Take 40 mg by mouth 2 (two) times daily.  Notes to Patient:  Elizebeth Koller current bottle. Then can use as needed     rosuvastatin 40 MG tablet  Commonly known as:  CRESTOR  Take 1 tablet (40 mg total) by mouth daily at 6 PM.           Follow-up Information    Follow up with [name], MD On 06/07/2015.   Specialty:  Family Medicine   Why:  @ 11 AM   Contact information:   [address] [place] [address] [phone]       Follow up with Charlett Blake, MD.   Specialty:  Physical Medicine and Rehabilitation   Why:  Office to call with appointment   Contact information:   [address] [place] [address] [phone]       Follow up with Xu,Jindong, MD.  Call today.   Specialty:  Neurology   Why:  for follow up appointment in 4 weeks   Contact information:   [address] [place] [address] [phone]       Follow up with Leonie Man, MD. Call today.   Specialty:  Cardiology   Why:  for follow up appointment   Contact information:   [address] [place] [address] [phone]       Signed: Bary Leriche 05/26/2015, 1:33 PM

## 2015-05-26 NOTE — Discharge Instructions (Addendum)
Inpatient Rehab Discharge Instructions  Dayne Chait Discharge date and time:  05/26/15  Activities/Precautions/ Functional Status: Activity: activity as tolerated No Driving Diet: cardiac diet Wound Care: none needed   Functional status:  ___ No restrictions     ___ Walk up steps independently _X__ 24/7 supervision/assistance   ___ Walk up steps with assistance ___ Intermittent supervision/assistance  ___ Bathe/dress independently ___ Walk with walker     _X__ Bathe/dress with assistance ___ Walk Independently    ___ Shower independently ___ Walk with assistance    ___ Shower with assistance _X__ No alcohol     ___ Return to work/school ________   COMMUNITY REFERRALS UPON DISCHARGE:   Home Health:   PT     OT     ST    RN     Agency:  [place] Phone:  [phone] Medical Equipment/Items Ordered:  None needed    GENERAL COMMUNITY RESOURCES FOR PATIENT/FAMILY: Support Groups:  [facility] Stroke Support Group                              Meets the third Sunday of every month at 3pm                              In the dayroom on [address] at [facility] Inpatient Rehab Unit  Mental Health:  Dr. Karen Pollard, PsyD - Neuropsychologist you saw at the hospital                          [facility]                          [address], [place]  [address]                          [phone]  Special Instructions:    STROKE/TIA DISCHARGE INSTRUCTIONS SMOKING Cigarette smoking nearly doubles your risk of having a stroke & is the single most alterable risk factor  If you smoke or have smoked in the last 12 months, you are advised to quit smoking for your health.  Most of the excess cardiovascular risk related to smoking disappears within a year of stopping.  Ask you doctor about anti-smoking medications  [place] Quit Line: 1-800-QUIT NOW  Free Smoking Cessation Classes [phone]    CHOLESTEROL Know your levels; limit fat & cholesterol in your diet  Lipid Panel     Component Value Date/Time   CHOL 163 05/15/2015 0237   TRIG 144 05/15/2015 0237   HDL 23* 05/15/2015 0237   CHOLHDL 7.1 05/15/2015 0237   VLDL 29 05/15/2015 0237   LDLCALC 111* 05/15/2015 0237      Many patients benefit from treatment even if their cholesterol is at goal.  Goal: Total Cholesterol (CHOL) less than 160  Goal:  Triglycerides (TRIG) less than 150  Goal:  HDL greater than 40  Goal:  LDL (LDLCALC) less than 100   BLOOD PRESSURE American Stroke Association blood pressure target is less that 120/80 mm/Hg  Your discharge blood pressure is:  BP: (!) 118/56 mmHg  Monitor your blood pressure  Limit your salt and alcohol intake  Many individuals will require more than one medication for high blood pressure  DIABETES (A1c is a blood sugar average for last 3 months) Goal HGBA1c is under 7% (HBGA1c is blood sugar average for last 3 months)  Diabetes: No known diagnosis of diabetes    Lab Results  Component Value Date   HGBA1C 6.2* 05/15/2015     Your HGBA1c can be lowered with medications, healthy diet, and exercise.  Check your blood sugar as directed by your physician  Call your physician if you experience unexplained or low blood sugars.  PHYSICAL ACTIVITY/REHABILITATION Goal is 30 minutes at least 4 days per week  Activity: No driving, Therapies: See above Return to work:  N/a  Activity decreases your risk of heart attack and stroke and makes your heart stronger.  It helps control your weight and blood pressure; helps you relax and can improve your mood.  Participate in a regular exercise program.  Talk with your doctor about the best form of exercise for you (dancing, walking, swimming, cycling).  DIET/WEIGHT Goal is to maintain a healthy weight  Your discharge diet is: Diet Heart Room service appropriate?: Yes; Fluid consistency:: Thin  liquids Your height is:   Your  current weight is: Weight: 92.2 kg (203 lb 4.2 oz) Your Body Mass Index (BMI) is:    Following the type of diet specifically designed for you will help prevent another stroke.  Your goal weight range is:    Your goal Body Mass Index (BMI) is 19-24.  Healthy food habits can help reduce 3 risk factors for stroke:  High cholesterol, hypertension, and excess weight.  RESOURCES Stroke/Support Group:  Call [phone]   STROKE EDUCATION PROVIDED/REVIEWED AND GIVEN TO PATIENT Stroke warning signs and symptoms How to activate emergency medical system (call 911). Medications prescribed at discharge. Need for follow-up after discharge. Personal risk factors for stroke. Pneumonia vaccine given:  Flu vaccine given:  My questions have been answered, the writing is legible, and I understand these instructions.  I will adhere to these goals & educational materials that have been provided to me after my discharge from the hospital.       My questions have been answered and I understand these instructions. I will adhere to these goals and the provided educational materials after my discharge from the hospital.  Patient/Caregiver Signature _______________________________ Date __________  Clinician Signature _______________________________________ Date __________  Please bring this form and your medication list with you to all your follow-up doctor's appointments.        Single Leg - Eyes Open  Holding support, lift right leg while maintaining balance over other leg. Progress to removing hands from support surface for longer periods of time. Hold____ seconds. Repeat ____ times per session. Do ____ sessions per day. Single Leg (Compliant Surface) - Eyes Open  Stand on compliant surface: ________ holding support. Lift right leg while maintaining balance over other leg. Progress to removing hands from support surface for longer periods of time. Hold____ seconds. Repeat ____ times per session.  Do ____ sessions per day.  Feet Heel-Toe "Tandem", Arm Motion  Eyes Open  With eyes open, right foot directly in front of the other, move arms up and down: to front. Repeat ____ times per session. Do ____ sessions per day. Feet Heel-Toe "Tandem" (Compliant Surface) Arm Motion - Eyes Open  With eyes open, standing on compliant surface: ________, right foot directly in front of the  other, move arms up and down: to front. Repeat ____ times per session. Do ____ per day.  Feet Heel-Toe "Tandem"   Arms outstretched, walk a straight line bringing one foot directly in front of the other. Repeat for ____ minutes per session. _ per day. Side-Stepping   Walk to left side with eyes open. Take even steps, leading with same foot. Repeat in other direction. Repeat for _ minutes per session.Do __ per day.   Braiding   Move to side: 1) cross right leg in front of left, 2) bring back leg out to side, then 3) cross right leg behind left, 4) bring left leg out to side. Continue sequence in same direction. Reverse sequence, moving in opposite direction. Repeat sequence ____ times per session. Do ____ sessions per day. Repeat on compliant surface: ________.  Copyright  VHI. All rights reserved.

## 2015-05-26 NOTE — Telephone Encounter (Signed)
-----   Message from [name], MD sent at 05/26/2015  9:32 AM EDT ----- Hi, Katrina:  Please let the pt know that his pathology report of his colon polyp showed no malignancy. Continue to follow up with GI. Thanks.  Could you also please set up a follow up appointment with me in 2 months? Thanks.   [name], MD PhD Stroke Neurology 05/26/2015 9:30 AM

## 2015-05-26 NOTE — Plan of Care (Signed)
Problem: RH PAIN MANAGEMENT Goal: RH STG PAIN MANAGED AT OR BELOW PT'S PAIN GOAL Pain<3  Outcome: Completed/Met Date Met:  05/26/15 No c/o pain

## 2015-05-26 NOTE — Progress Notes (Addendum)
Subjective/Complaints: Patient seen and examined this morning laying in bed. Patient states he slept well overnight. He denies complaints this morning and is looking for to going home. I again spoke to the patient about his diet and the importance of maintaining good nutrition. He states he understands and believes she is getting better. He says he will continue to try to improve his diet.  Review of systems denies CP, SOB, n/v/d.   Objective: Vital Signs: Blood pressure 118/56, pulse 70, temperature 99.1 F (37.3 C), temperature source Oral, resp. rate 17, weight 92.2 kg (203 lb 4.2 oz), SpO2 96 %. No results found. Results for orders placed or performed during the hospital encounter of 05/18/15 (from the past 72 hour(s))  Urinalysis, Routine w reflex microscopic (not at [facility])     Status: Abnormal   Collection Time: 05/23/15  7:53 PM  Result Value Ref Range   Color, Urine AMBER (A) YELLOW    Comment: BIOCHEMICALS MAY BE AFFECTED BY COLOR   APPearance CLEAR CLEAR   Specific Gravity, Urine 1.027 1.005 - 1.030   pH 5.0 5.0 - 8.0   Glucose, UA NEGATIVE NEGATIVE mg/dL   Hgb urine dipstick NEGATIVE NEGATIVE   Bilirubin Urine SMALL (A) NEGATIVE   Ketones, ur 15 (A) NEGATIVE mg/dL   Protein, ur 30 (A) NEGATIVE mg/dL   Urobilinogen, UA 0.2 0.0 - 1.0 mg/dL   Nitrite NEGATIVE NEGATIVE   Leukocytes, UA NEGATIVE NEGATIVE  Culture, Urine     Status: None   Collection Time: 05/23/15  7:53 PM  Result Value Ref Range   Specimen Description URINE, CLEAN CATCH    Special Requests NONE    Culture NO GROWTH 1 DAY    Report Status 05/24/2015 FINAL   Urine microscopic-add on     Status: Abnormal   Collection Time: 05/23/15  7:53 PM  Result Value Ref Range   Squamous Epithelial / LPF RARE RARE   WBC, UA 0-2 <3 WBC/hpf   RBC / HPF 0-2 <3 RBC/hpf   Casts HYALINE CASTS (A) NEGATIVE   Urine-Other MUCOUS PRESENT   Basic metabolic panel     Status: Abnormal   Collection Time: 05/24/15  5:40 AM   Result Value Ref Range   Sodium 137 135 - 145 mmol/L   Potassium 3.8 3.5 - 5.1 mmol/L   Chloride 101 101 - 111 mmol/L   CO2 28 22 - 32 mmol/L   Glucose, Bld 108 (H) 65 - 99 mg/dL   BUN 11 6 - 20 mg/dL   Creatinine, Ser 0.94 0.61 - 1.24 mg/dL   Calcium 8.5 (L) 8.9 - 10.3 mg/dL   GFR calc non Af Amer >60 >60 mL/min   GFR calc Af Amer >60 >60 mL/min    Comment: (NOTE) The eGFR has been calculated using the CKD EPI equation. This calculation has not been validated in all clinical situations. eGFR's persistently <60 mL/min signify possible Chronic Kidney Disease.    Anion gap 8 5 - 15  CBC     Status: Abnormal   Collection Time: 05/24/15  5:40 AM  Result Value Ref Range   WBC 13.7 (H) 4.0 - 10.5 K/uL   RBC 2.86 (L) 4.22 - 5.81 MIL/uL   Hemoglobin 8.8 (L) 13.0 - 17.0 g/dL   HCT 27.2 (L) 39.0 - 52.0 %   MCV 95.1 78.0 - 100.0 fL   MCH 30.8 26.0 - 34.0 pg   MCHC 32.4 30.0 - 36.0 g/dL   RDW 13.2 11.5 -  15.5 %   Platelets 438 (H) 150 - 400 K/uL  CBC with Differential/Platelet     Status: Abnormal   Collection Time: 05/26/15  4:08 AM  Result Value Ref Range   WBC 10.2 4.0 - 10.5 K/uL   RBC 2.87 (L) 4.22 - 5.81 MIL/uL   Hemoglobin 8.6 (L) 13.0 - 17.0 g/dL   HCT 27.1 (L) 39.0 - 52.0 %   MCV 94.4 78.0 - 100.0 fL   MCH 30.0 26.0 - 34.0 pg   MCHC 31.7 30.0 - 36.0 g/dL   RDW 13.2 11.5 - 15.5 %   Platelets 455 (H) 150 - 400 K/uL   Neutrophils Relative % 56 %   Neutro Abs 5.8 1.7 - 7.7 K/uL   Lymphocytes Relative 30 %   Lymphs Abs 3.0 0.7 - 4.0 K/uL   Monocytes Relative 8 %   Monocytes Absolute 0.8 0.1 - 1.0 K/uL   Eosinophils Relative 5 %   Eosinophils Absolute 0.5 0.0 - 0.7 K/uL   Basophils Relative 1 %   Basophils Absolute 0.1 0.0 - 0.1 K/uL     HEENT: Normocephalic, atraumatic Cardio: RRR and no murmurs Resp: CTA B/L and unlabored GI: BS positive and nontender nondistended Skin:   bruising right forearm.  Warm and dry Neuro: Alert/Oriented, Normal sensation Musc/Skel:    No Edema No tenderness Gen.  no acute distress. Vital signs reviewed.   Assessment/Plan: 1. 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 Physiatrist is providing close team supervision and 24 hour management of active medical problems listed below. Physiatrist and rehab team continue to assess barriers to discharge/monitor patient progress toward functional and medical goals. Team conference today please see physician documentation under team conference tab, met with team face-to-face to discuss problems,progress, and goals. Formulized individual treatment plan based on medical history, underlying problem and comorbidities. FIM: Function - Bathing Position: Shower Body parts bathed by patient: Right arm, Chest, Abdomen, Front perineal area, Right upper leg, Left upper leg, Buttocks, Left arm, Right lower leg, Left lower leg, Back Body parts bathed by helper: Left arm, Buttocks, Right lower leg, Left lower leg, Back Assist Level: More than reasonable time  Function- Upper Body Dressing/Undressing What is the patient wearing?: Pull over shirt/dress Pull over shirt/dress - Perfomed by patient: Thread/unthread right sleeve, Thread/unthread left sleeve, Put head through opening, Pull shirt over trunk Assist Level: More than reasonable time Function - Lower Body Dressing/Undressing What is the patient wearing?: Socks, Shoes, Pants, Underwear Position: Sitting EOB Underwear - Performed by patient: Thread/unthread right underwear leg, Thread/unthread left underwear leg, Pull underwear up/down Pants- Performed by patient: Fasten/unfasten pants, Pull pants up/down, Thread/unthread left pants leg, Thread/unthread right pants leg Non-skid slipper socks- Performed by patient: Don/doff right sock, Don/doff left sock Socks - Performed by patient:  Don/doff right sock, Don/doff left sock Shoes - Performed by patient: Don/doff right shoe, Don/doff left shoe, Fasten right, Fasten left Assist for footwear: Supervision/touching assist (per report) Assist for lower body dressing: More than reasonable time  Function - Toileting Toileting steps completed by patient: Adjust clothing prior to toileting, Performs perineal hygiene, Adjust clothing after toileting Toileting Assistive Devices: Grab bar or rail Assist level: Supervision or verbal cues  Function - Air cabin crew transfer assistive device: Grab bar Assist level to toilet: No Help, no cues, assistive device, takes more than a reasonable amount of time Assist level from  toilet: No Help, no cues, assistive device, takes more than a reasonable amount of time  Function - Chair/bed transfer Chair/bed transfer method: Ambulatory Chair/bed transfer assist level: Supervision or verbal cues Chair/bed transfer details: Verbal cues for precautions/safety  Function - Locomotion: Wheelchair Will patient use wheelchair at discharge?: No Wheelchair activity did not occur: N/A Function - Locomotion: Ambulation Assistive device: No device Max distance: 150 Assist level: Supervision or verbal cues Assist level: Supervision or verbal cues Assist level: Supervision or verbal cues Assist level: Supervision or verbal cues Assist level: Touching or steadying assistance (Pt > 75%)  Function - Comprehension Comprehension: Auditory Comprehension assist level: Understands basic 90% of the time/cues < 10% of the time  Function - Expression Expression: Verbal Expression assist level: Expresses basic 75 - 89% of the time/requires cueing 10 - 24% of the time. Needs helper to occlude trach/needs to repeat words.  Function - Social Interaction Social Interaction assist level: Interacts appropriately 90% of the time - Needs monitoring or encouragement for participation or  interaction.  Function - Problem Solving Problem solving assist level: Solves basic 75 - 89% of the time/requires cueing 10 - 24% of the time  Function - Memory Memory assist level: Recognizes or recalls less than 25% of the time/requires cueing greater than 75% of the time Patient normally able to recall (first 3 days only): Location of own room, Current season, That he or she is in a hospital   Medical Problem List and Plan: 1. Functional deficits secondary to left occipital CVA, Gait disturbance related to stroke, right homonymous hemianopsia, no significant hemiparesis  - Discharge today 2.  DVT Prophylaxis/Anticoagulation: Pharmaceutical: Lovenox, reviewed 10/21: 455 3. Pain Management: Tylenol as needed, no specific pain issues currently       4. Mood: Monitor for signs of depression, some vegetative signs, neuro psych consultPatient may have some component of depression given recent events. Will continue to encourage patient and have him follow-up as outpatient 5. Neuropsych: This patient Is capable of making decisions on His own behalf. 6. Skin/Wound Care: Monitor for signs of skin breakdown 7. Fluids/Electrolytes/Nutrition: Intake and output, nutritional supplements as needed 8. CAD s/p STEMI: On ASA,Plavix 9.  LGIB:Will need follow-up with GI as an outpatient, we will monitor serial CBCs as well as stools, stable at 8.6 on 10/21 10. HTN:Was on Coreg, Lasix, Prinivil at home , restarted coreg on 10/13, HR and BP 118/56, no need to restart prinivil at present, poor intake likely contributing to low BPs 11.  Hypoalbuminemia- start beneprotein 12. Poor appetite: Rremoron added for appetite on 10/19.  Megace added 10/20.  LOS (Days) 8 A FACE TO FACE EVALUATION WAS PERFORMED  [name] 05/26/2015, 10:07 AM

## 2015-05-26 NOTE — Telephone Encounter (Signed)
If patient calls back please schedule him an appointment wit Dr. Erlinda Hong he is a new patient hospital follow up. thanks   LFt vm for patient to return phone call, and to give results. Also was calling patient a new patient appt.

## 2015-05-26 NOTE — Progress Notes (Signed)
Pt discharged home with family. Discharge instructions provided by Algis Liming, PA. All questions answered. Pt escorted off unit in w/c with personal belonging by Haze Boyden, NT

## 2015-05-26 NOTE — Progress Notes (Signed)
Social Work Patient ID: Damon Walker, male   DOB: [birth year], 69 y.o.   MRN: [id number]   CSW spoke with pt, pt's wife, dtr, and son on 05-24-15 to update them on team conference discussion.  They are prepared to take pt home and will be with him to provide supervision.  Wife has already purchased shower seat, but wanted a transport chair for pt.  CSW provided them with prescription to get one from a medical supply store, as we do not have those available at the hospital through [place].  Pt will have [place] at home for PT/OT/RN/ST.  Pt is currently in his open enrollment for medicare, so he could add Part D which would help with his medications.  CSW informed family of this.  No other needs/concerns/questions at this time.

## 2015-06-01 ENCOUNTER — Telehealth: Payer: Self-pay | Admitting: *Deleted

## 2015-06-01 ENCOUNTER — Ambulatory Visit (INDEPENDENT_AMBULATORY_CARE_PROVIDER_SITE_OTHER): Payer: Medicare Other | Admitting: Cardiology

## 2015-06-01 VITALS — BP 114/58 | HR 61 | Ht 66.0 in | Wt 184.7 lb

## 2015-06-01 DIAGNOSIS — I739 Peripheral vascular disease, unspecified: Secondary | ICD-10-CM

## 2015-06-01 DIAGNOSIS — I639 Cerebral infarction, unspecified: Secondary | ICD-10-CM | POA: Diagnosis not present

## 2015-06-01 DIAGNOSIS — I255 Ischemic cardiomyopathy: Secondary | ICD-10-CM | POA: Diagnosis not present

## 2015-06-01 DIAGNOSIS — Z955 Presence of coronary angioplasty implant and graft: Secondary | ICD-10-CM

## 2015-06-01 DIAGNOSIS — Z720 Tobacco use: Secondary | ICD-10-CM

## 2015-06-01 DIAGNOSIS — I5042 Chronic combined systolic (congestive) and diastolic (congestive) heart failure: Secondary | ICD-10-CM

## 2015-06-01 DIAGNOSIS — I779 Disorder of arteries and arterioles, unspecified: Secondary | ICD-10-CM

## 2015-06-01 DIAGNOSIS — I63532 Cerebral infarction due to unspecified occlusion or stenosis of left posterior cerebral artery: Secondary | ICD-10-CM

## 2015-06-01 DIAGNOSIS — I10 Essential (primary) hypertension: Secondary | ICD-10-CM | POA: Diagnosis not present

## 2015-06-01 DIAGNOSIS — E785 Hyperlipidemia, unspecified: Secondary | ICD-10-CM | POA: Diagnosis not present

## 2015-06-01 DIAGNOSIS — R7303 Prediabetes: Secondary | ICD-10-CM

## 2015-06-01 DIAGNOSIS — I2109 ST elevation (STEMI) myocardial infarction involving other coronary artery of anterior wall: Secondary | ICD-10-CM

## 2015-06-01 DIAGNOSIS — I2510 Atherosclerotic heart disease of native coronary artery without angina pectoris: Secondary | ICD-10-CM

## 2015-06-01 NOTE — Telephone Encounter (Signed)
Wife [phone] returned call. New Patient appointment scheduled for 07/05/15.

## 2015-06-01 NOTE — Telephone Encounter (Signed)
-----   Message from [name], MD sent at 05/26/2015  9:32 AM EDT ----- Hi, Katrina:  Please let the pt know that his pathology report of his colon polyp showed no malignancy. Continue to follow up with GI. Thanks.  Could you also please set up a follow up appointment with me in 2 months? Thanks.   [name], MD PhD Stroke Neurology 05/26/2015 9:30 AM

## 2015-06-01 NOTE — Telephone Encounter (Signed)
I called and relayed the results as per below to pts wife.  She verbalized understanding. Confirmed appt 07-05-15 with Dr. Erlinda Hong.

## 2015-06-01 NOTE — Patient Instructions (Addendum)
NO CHANGE IN CURRENT TREATMENT OR MEDICATIONS   Your physician recommends that you schedule a follow-up appointment in [place] - 7-8 WEEKS POST- [place]    Your physician recommends that you schedule a follow-up appointment in [place].   If you need a refill on your cardiac medications before your next appointment, please call your pharmacy.

## 2015-06-01 NOTE — Progress Notes (Signed)
PCP: [name], MD  Clinic Note: Chief Complaint  Patient presents with  . Follow-up    post CATH //pt states SOB on exertion and mild swelling in legs/feet/ankles  . Fatigue    sleeps constantly, only awake for short periods  . Coronary Artery Disease  . Code STEMI  . Cerebrovascular Accident    HPI: Damon Walker is a 69 y.o. male with a very complicated medical history noted below who presents today for initial Post-Hospital f/u after Anterior STEMI & CVA.  Damon Walker was originally seen in consultation last November for atypical CP -- evaluated with OP Myoview that was suggestive of prior Inferior MI, but LOW RISK due to no Ischemia.  EF was a bit lower that  By Echo. He was seen by Ellen Henri in clinic. As you is not having any symptoms in December, we decided to forego any invasive evaluation. He was then seen again in May of 2016 complaining of exertional throat pain/tightness.  He had anterolateral T wave inversions. Plan was to evaluate with a GXT. This never occurred Recent Hospitalizations:  He unfortunately suffered an anterior STEMI on October 6. -- PCI to LAD with plan to consider staged RCA PCI (+/- OM2 or 3).  D/c on 10/9; EF of 20-25% by echo. - No LifeVest as post-MI NSVT was only Day 1.   Discharged on aspirin plus Brilinta  He was discharged on October 9  2 hours later return to [facility] ER with sudden onset of confusion and decreased vision in the right visual field --> suspected left PCA distribution infarct (CT scan did not show any cranial abnormality) --> treatment in the ER/NSICU with TPA. --> name post TPA symptom was right hemianopsia  Post TPA, he developed acute lower GI bleed with a drop in hemoglobin. He was given FFP. Aspirin Lipitor placed on hold following TPA.  Ct Angio head and Neck W/cm &/or Wo/cm 05/15/2015 1. Acute left PCA branch infarct involving the occipital lobe. No hemorrhagic conversion post tPA. 2. Poor flow in  the non dominant right vertebral artery, favor high-grade proximal stenosis over dissection. 3. Mild to moderate left P1 segment stenosis. No treatable flow limiting intracranial stenosis. 4. Extensive atherosclerosis, predominately extracranial. Proximal left ICA stenosis approaching 50%. 5. 3 mm left paraclinoid ICA aneurysm.   MRI 05/15/2015 Acute infarct left occipital lobe.  Chronic microvascular ischemic changes in the white matter and left pons. Encephalomalacia right temporal and frontal lobe is chronic and unchanged. This may be due to in utero infarct.   Hemoglobin stabilized as the bleeding. GI evaluation - with flexible sigmoidoscopy showed no active bleeding.  He was switched from aspirin +1-2 Plavix alone 24 hours post TPA. - No further bleeding.  Followup echo showed improved EF to 35-40%.  I was asked to consult of this on October 10 and follow him during his inpatient stay following stroke. He did not have any anginal symptoms during that time included a period of his recovery.  He was discharged from [facility] inpatient to inpatient rehabilitation on the 13th.  Finally able to restart low-dose beta blocker prior to discharge  Discharged from [facility] on 10/21.    Studies Reviewed: cardiac cath report from 10/6, echocardiogram from 10/8 and 10/10. MRI of the brain from 10/10, carotid Dopplers from June 2016 all reviewed and updated in past medical history/past surgical history. Multiple clinic notes from December 2015, May 2015 from cardiology as well as vascular surgery in July 2016 were reviewed.  Hospital stays were reviewed - including H&P's and discharge summaries as well as the progress notes ==> total time in chart review was well over one hour.  Interval History: Damon Walker presents today for followup. He is noticeably down and emotionally withdrawn today compared to his usual self. He did not have any significant anginal chest pain during his recovery and  rehabilitation. He is very concerned about his hemianopsia, but really has no residual muscle skeletal defects from a stroke. He has had some mild swelling in his feet and ankles however does not have any significant PND or orthopnea. He has not really had any significant amount of exertion, but we would be still and he is not had notable anginal symptoms.he does however note being somewhat dyspneic with exertion. He is quite deconditioned.  Mild orthopnea but no PND. No palpitations, lightheadedness, dizziness, weakness or syncope/near syncope. No TIA/amaurosis fugax symptoms. No melena, hematochezia, hematuria, or epstaxis. No claudication.  ROS: A comprehensive was performed. Review of Systems  Constitutional: Positive for malaise/fatigue.  Eyes:       Right hemianopsia  Respiratory: Positive for cough (Worse in the morning. But nonproductive.) and shortness of breath. Negative for wheezing.   Cardiovascular:       Per history of present illness  Endo/Heme/Allergies: Does not bruise/bleed easily.  Psychiatric/Behavioral: Positive for depression (endorses anhedonia, poor PO, fatigue with poor sleep - too much sleepiness). The patient is nervous/anxious.        To be expected he has some sense of depression and anxiety from his combination of stroke and MI. Having trouble coming to [place] with his morbidity mortality. He is scared to exert himself.  All other systems reviewed and are negative.   Past Medical History  Diagnosis Date  . ST elevation myocardial infarction (STEMI) involving left anterior descending (LAD) coronary artery with complication ([place]) 16/08/958    100% Prox LAD - PCI with overlapping DES x 2  . Coronary artery disease involving native heart 05/11/2015    Cath  three-vessel CAD with 90% prox RCA, 80% mid RCA, 80% OM3, 100% prox LAD occlusion treated with DESx2. RCA and OM residual treated medically for now, however will need PCI if has recurrent CP  . CAD S/P  percutaneous coronary angioplasty 05/11/2015    PCI to Prox-Mid LAD with Overlapping Promus Premier DES 3.0 x 38 & 3.0 x 16 (post-dilated to 3.5 mm)   . Ischemic cardiomyopathy 05/11/2015    EF 40% on cath 05/11/2015 after anterior STEMI, EF 20-25% on echo 05/13/2015;   . Carotid arterial disease ([place])   . Hyperlipidemia with target LDL less than 70   . Essential hypertension   . 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 ([place]) 05/14/2015    -- Given TPA.  MRI Brain: Acute L Occipital Lobe Infarct. Chronic microvascular ischemic changes in the white matter and left pons.  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 Carotid arterial disease ([place]) 06/2014; 01/09/2015    Followed by Dr. Donnetta Hutching of [facility] -- a) Carotid u/s: 40-59% bilat ICA stenosis; b) Mod 50-69% Bilateral R Int Carotid (heteroenous plaque), Normal L Vert A, unable to find R Vert A.   . Tobacco abuse     a. 30 yrs - 1.5 ppd. - Quit 05/11/2015  . Prediabetes Oct 2016    A1C 6.0 in Oct 2016  . H/O: GI bleed 05/15/2015    s/p Sigmoid Polypectomy 05/04/2015 - prior to STEMI on  May 11, 2015; Had GI Bleed following TPA for CVA, while on ASA & Brilinta; Flex Sig - 1. Internal Hemorrhoids, 2. Distal sigmoid polypectomy site with flat [place] status post biopsy and tattoo with 58mL spot over 3 injections 3. Proximal sigmoid polypectomy site seen as well, 4) otherwise normal with no evidence of bleed  . Chronic combined systolic and diastolic congestive heart failure, NYHA class 2 ([place]) 05/11/2015    Past Surgical History  Procedure Laterality Date  . S/p appendectomy      Age 37  . S/p inguinal hernia repair      In his 20's.  . Cardiac catheterization N/A 05/11/2015    Procedure: Left Heart Cath and Coronary Angiography;  Surgeon: Peter M Martinique, MD;  Location: [place] [facility];  Service: Cardiovascular;  100% pLAD (long lesion). RCA - prox 90%, mid 80%. OM2 & OM3 80% (OM2 & 3 not  necessarily PCI targets); EF 35-45% with Anterio HK  . Cardiac catheterization N/A 05/11/2015    Procedure: Coronary Stent Intervention;  Surgeon: Peter M Martinique, MD;  Location: [place] [facility];  Service: Cardiovascular; PCI to Prox-Mid LAD with Overlapping Promus Premier DES 3.0 x 38 & 3.0 x 16 (post-dilated to 3.5 mm)    . Flexible sigmoidoscopy N/A 05/18/2015    Procedure: FLEXIBLE SIGMOIDOSCOPY;  Surgeon: Clarene Essex, MD;  Location: [facility];  Service: Endoscopy;  Laterality: N/A;  . Transthoracic echocardiogram  05/13/2015    Mlid LVH. EF 20-25% - Akinesis of mid-Apical Anterior, basal-mid AnteroSeptal, Mid Anterolateral, apical septal, apical-lateral & apical  walls.  Abnormal Diastolic function - indeterminate  . Transthoracic echocardiogram  05/15/2015    Mild LVH, EF 35-40% - Mod HK of mid-apical anteroseptal, anterior & apical walls.  Gr 1 DD. Mild bilateral Atrial Enlargement.   Prior to Admission medications   Medication Sig Start Date End Date Taking? Authorizing Provider  aspirin EC 81 MG tablet Take 1 tablet (81 mg total) by mouth daily. 05/26/15  Yes Ivan Anchors Love, PA-C  carvedilol (COREG) 3.125 MG tablet Take 3 tablets (9.375 mg total) by mouth 2 (two) times daily with a meal. 05/14/15  Yes Almyra Deforest, PA  clopidogrel (PLAVIX) 75 MG tablet Take 1 tablet (75 mg total) by mouth daily. 05/26/15  Yes Ivan Anchors Love, PA-C  feeding supplement, ENSURE ENLIVE, (ENSURE ENLIVE) LIQD Take 237 mLs by mouth 3 (three) times daily between meals. 05/26/15  Yes Ivan Anchors Love, PA-C  megestrol (MEGACE) 400 MG/10ML suspension Take 5 mLs (200 mg total) by mouth 3 (three) times daily before meals. To help with appetite 05/26/15  Yes Ivan Anchors Love, PA-C  mirtazapine (REMERON SOL-TAB) 15 MG disintegrating tablet Take 1 tablet (15 mg total) by mouth at bedtime. 05/26/15  Yes Ivan Anchors Love, PA-C  nitroGLYCERIN (NITROSTAT) 0.4 MG SL tablet Place 1 tablet (0.4 mg total) under the tongue every 5 (five) minutes  x 3 doses as needed for chest pain. 05/14/15  Yes Almyra Deforest, PA  pantoprazole (PROTONIX) 40 MG tablet Take 40 mg by mouth 2 (two) times daily.   Yes Historical Provider, MD  rosuvastatin (CRESTOR) 40 MG tablet Take 1 tablet (40 mg total) by mouth daily at 6 PM. 05/14/15  Yes Almyra Deforest, PA   Allergies  Allergen Reactions  . Latex Itching and Rash    Social History   Social History  . Marital Status: Married    Spouse Name: N/A  . Number of Children: 3  . Years of  Education: N/A   Occupational History  . retired from [employer]  . Smoking status: Former Smoker -- 1.00 packs/day for 30 years    Types: Cigarettes    Quit date: 05/11/2015  . Smokeless tobacco: None     Comment: Quit the day of his STEMI  . Alcohol Use: No  . Drug Use: No  . Sexual Activity: Not Asked   Other Topics Concern  . None   Social History Narrative   Lives in [place] with wife. Originally from [place].    Family History  Problem Relation Age of Onset  . Adopted: Yes  . Other      Adopted - unaware of biological parent's histories.  . Other Mother     eczema   . Cancer Mother     Wt Readings from Last 3 Encounters:  06/01/15 184 lb 11.2 oz (83.779 kg)  05/25/15 203 lb 4.2 oz (92.2 kg)  05/14/15 196 lb 3.4 oz (89 kg)    PHYSICAL EXAM BP 114/58 mmHg  Pulse 61  Ht 5\' 6"  (1.676 m)  Wt 184 lb 11.2 oz (83.779 kg)  BMI 29.83 kg/m2 General appearance: alert, cooperative, appears stated age, no distress and borderline obese HEENT: [place]/AT, EOMI, MMM, anicteric sclera Neck: no adenopathy, no carotid bruit and no JVD Lungs: clear to auscultation bilaterally, normal percussion bilaterally and non-labored Heart: regular rate and rhythm, S1, S2 normal, no murmur, click, rub or gallop; non-displaced PMI Abdomen: soft, non-tender; bowel sounds normal; no masses,  no organomegaly; mild truncal obesity Extremities: extremities normal, atraumatic, no cyanosis, and trace  edema.  Bilateral spider telangiectasias with mild varicosities Pulses: 2+ and symmetric;  Skin: mobility and turgor normal with mild venous stasis changes in BLE. Neurologic: Mental status: Alert, oriented, thought content appropriate; Mood seems somewhat flat/depressed.  Cranial nerves: normal (II-XII grossly intact) with the exception of R Hemianopsia    Adult ECG Report  Rate: 61 ;  Rhythm: normal sinus rhythm and Diffuse Anterior-Anterolateral (V2-V6, I, II, aVF) TWI -- consider ischemia;   Narrative Interpretation: Findings are consistent with evolutionary Anterior STEMI; Normal Axis, intervals, durations & voltage   Other studies Reviewed: Additional studies/ records that were reviewed today include:  Recent Labs:    Lab Results  Component Value Date   CHOL 163 05/15/2015   HDL 23* 05/15/2015   LDLCALC 111* 05/15/2015   TRIG 144 05/15/2015   CHOLHDL 7.1 05/15/2015   Lab Results  Component Value Date   CREATININE 0.94 05/24/2015   Lab Results  Component Value Date   K 3.8 05/24/2015    ASSESSMENT / PLAN: Problem List Items Addressed This Visit    Tobacco abuse (Chronic)    Smoking cessation instruction/counseling given:  commended patient for quitting and reviewed strategies for preventing relapses      Stroke with cerebral ischemia ([place]) (Chronic)    Usually she has now is the right hemianopsia. Who knows this will likely recover. He did have some notable improvement of other symptoms with TPA. He is back on aspirin plus Plavix as well as statin.      Relevant Orders   EKG 12-Lead   ST elevation myocardial infarction (STEMI) of anterior wall, subsequent episode of care ([place]) - Primary (Chronic)    At this point, he's completed his neuro rehabilitation from his stroke. I think he would benefit well from cardiac rehabilitation. I would like to get him somewhat active bowel outside of the house  so he can start getting over the post MI/stroke depression. I do think he  really had time for his MI to set in before he had a stroke. It is just overwhelmed by the combination of both events. Completed in-patient Rehab  - will now refer to [facility] as much for Physical activity as for Neuro/Psych status.      Relevant Orders   EKG 12-Lead   Presence of drug coated stent in LAD coronary artery (Chronic)    Because of bleeding issues, he was converted from Brilinta to aspirin plus Plavix with aspirin restarted once his hemoglobin level stabilized after GI bleed. Outpatient meds consider with his having a stroke and significant coronary disease and he will be on lifelong antiplatelet therapy.      Prediabetes   Hyperlipidemia with target LDL less than 70 (Chronic)    LDL last year was 128 -- was on Lipitor 40mg ; was 111 on post CVA day #1 --> Now on Crestor 40mg  Need to reassess in ~ 3 months  -> may need to consider adding Zetia if not at goal.        Relevant Orders   EKG 12-Lead   Essential hypertension (Chronic)    Has had true HTN in the past, but now barely able to tolerate low dose BB. Was on ACE-I in the past -- would opt for ARB if able (allows for conversion to [facility])      Relevant Orders   EKG 12-Lead   Coronary artery disease involving native coronary artery of native heart without angina pectoris (Chronic)    In addition to his LAD lesion that was stented, he has significant disease in the RCA which would require several stents but also has the circumflex lesions. Most likely target for repeat do PCR would be the RCA as the circumflex lesions were harder to treat and smaller vessels.  He is now on low dose Carvedilol - cannot really increase with BP in 110s & recent CVA. On DAPT + Statin. He quit smoking when he came in for his STEMI & has not restarted.  He hasn't been doing much in the way of any significant activity and to assess her symptoms. Needs [facility] to [facility].    I would like to have him follow up with an APP in roughly one month to  reassess his level of symptoms including exertional chest pain plus minus dyspnea as well as any potential arrhythmias. If he were to have any symptoms of go ahead and plan on proceeding with stage IIIc I at least the RCA and potentially considering the circumflex lesions. Otherwise if no symptoms are present, would consider Myoview stress test to assess for extent of ischemia.      Chronic combined systolic and diastolic congestive heart failure, NYHA class 2 (HCC) (Chronic)    Class II, Stage D Combined CHF - but not really all that symptomatic. ? If his DOE is related to his MI/Cardiomyopathy with DHF Sx vs. COPD from long-term smoking combined with deconditioning. Currently only on low dose BB - no room for additional meds. No diuretic requirement       Carotid arterial disease (HCC) (Chronic)   Cardiomyopathy, ischemic: EF improved to 35-40% from 20-25% post PCI LAD (Chronic)    Thankfully, his EF did improve with PCI of LAD - no longer in the category to consider LifeVest, but is borderline ICD category.   He had a suggestion of Inferior MI on Myoview last fall, suggesting that the  RCA lesions may have been Infarct related at an earlier date.  Perhaps with RCA PCI, his EF will improve even more. Only able to tolerate Carvedilol for now -- as BP improves would consider ARB vs. Entresto No requirement for diuretic - partly b/c less than adequate nutrition over the past few weeks since his MI.         Current medicines are reviewed at length with the patient today. (+/- concerns) none The following changes have been made:  [place]   Your physician recommends that you schedule a follow-up appointment in [place] - 7-8 WEEKS POST- MI  Your physician recommends that you schedule a follow-up appointment in [place].  Studies Ordered:   Orders Placed This Encounter  Procedures  . EKG 12-Lead      Damon Walker, M.D., M.S. Interventional Cardiologist   Pager # [phone]

## 2015-06-03 ENCOUNTER — Encounter: Payer: Self-pay | Admitting: Cardiology

## 2015-06-03 NOTE — Consult Note (Signed)
NEUROCOGNITIVE STATUS EXAMINATION - [place]   [name] is a 69 year old, right-handed man, who was seen for a brief neurocognitive status examination to evaluate his emotional state and mental status in the setting of CVA.  According to his medical record, he developed sudden onset of confusion with right visual field deficits after being discharged (05/14/15) following a stent procedure (05/11/15).  He was re-admitted and CT was negative for acute changes.  However, he was treated with IV TPA due to suspicion of left PCA infarct.  Later MRI of the brain revealed acute left occipital lobe infarct and chronic encephalomalacia in the right temporal and frontal lobe (possibly related to in -utero infarct).  According to staff members on the rehabilitation unit, [name] displayed depressed mood as well as possible symptoms of dementia and therefore, a neuropsychological consult was requested.    Emotional Functioning:  During the clinical interview, [name] described his biggest concern as his eyesight, given visual field deficits.  Emotionally, however, he denied symptoms of depression, including suicidal ideation and stated that he does not have a significant depressive history.  He did acknowledge fatigue related to "terrible" sleep recently.   He also said that his appetite has been poor because the hospital food was not appetizing to him.  He commented that although staff members want him to eat, that he is uninclined due to nausea.  Despite lack of appetite and fatigue, [name] stated that he remains highly motivated to participate in therapies while on the unit.  His responses to self-report measures of mood disruption were not indicative of the presence of clinically significant depression or anxiety at this time.    Mental Status:  [name] total score on an overall measure of mental status was suggestive of marked cognitive  disruption, at the level of dementia (MoCA = 7/22).  However, he also displayed extremely poor effort during test administration, often commenting that he was uninterested in testing and did not want to try.  At times, he would close his eyes and it was unclear if he was trying to think of responses or was falling asleep; upon questioning, he expressed disinterest in the testing process.  Despite poor effort at times, he was oriented to location and month; he was unable to correctly identify the year and stated that it was 26.  In addition, he could repeat 4 of 5 words immediately after presentation and could repeat a series of 5 digits forward and 3 digits backward.    Impressions and Recommendations:  [name] total score on a measure of mental status was suggestive of marked cognitive disruption at the level of dementia, including significant disorientation to year.  However, he also displayed poor effort on that measure and repeatedly noted that he did not feel like trying hard.  Therefore, it is possible that his cognitive abilities are greater than what is indicated herein.  The course of cognitive recovery in cases of stroke was discussed with [name] and he appeared to understand.  He was encouraged to seek a formal neuropsychological evaluation post-discharge should he notice cognitive issues that impact his daily functioning.  Contact information for a neuropsychologist in his area should be included in his discharge paperwork for that purpose.  From an emotional standpoint, there was no evidence to suggest the presence of clinically significant depression or anxiety.  However, risks for development of depression post-discharge were discussed and he agreed to inform his care team should  he notice signs of clinical depression.    DIAGNOSIS:   CVA  Marlane Hatcher, Psy.D.  Clinical Neuropsychologist

## 2015-06-03 NOTE — Assessment & Plan Note (Signed)
Because of bleeding issues, he was converted from Brilinta to aspirin plus Plavix with aspirin restarted once his hemoglobin level stabilized after GI bleed. Outpatient meds consider with his having a stroke and significant coronary disease and he will be on lifelong antiplatelet therapy.

## 2015-06-03 NOTE — Assessment & Plan Note (Addendum)
In addition to his LAD lesion that was stented, he has significant disease in the RCA which would require several stents but also has the circumflex lesions. Most likely target for repeat do PCR would be the RCA as the circumflex lesions were harder to treat and smaller vessels.  He is now on low dose Carvedilol - cannot really increase with BP in 110s & recent CVA. On DAPT + Statin. He quit smoking when he came in for his STEMI & has not restarted.  He hasn't been doing much in the way of any significant activity and to assess her symptoms. Needs referral to [facility].    I would like to have him follow up with an APP in roughly one month to reassess his level of symptoms including exertional chest pain plus minus dyspnea as well as any potential arrhythmias. If he were to have any symptoms of go ahead and plan on proceeding with stage IIIc I at least the RCA and potentially considering the circumflex lesions. Otherwise if no symptoms are present, would consider Myoview stress test to assess for extent of ischemia.

## 2015-06-03 NOTE — Assessment & Plan Note (Signed)
LDL last year was 128 -- was on Lipitor 40mg ; was 111 on post CVA day #1 --> Now on Crestor 40mg  Need to reassess in ~ 3 months  -> may need to consider adding Zetia if not at goal.

## 2015-06-03 NOTE — Assessment & Plan Note (Signed)
Has had true HTN in the past, but now barely able to tolerate low dose BB. Was on ACE-I in the past -- would opt for ARB if able (allows for conversion to [facility])

## 2015-06-03 NOTE — Assessment & Plan Note (Signed)
Usually she has now is the right hemianopsia. Who knows this will likely recover. He did have some notable improvement of other symptoms with TPA. He is back on aspirin plus Plavix as well as statin.

## 2015-06-03 NOTE — Assessment & Plan Note (Signed)
Thankfully, his EF did improve with PCI of LAD - no longer in the category to consider LifeVest, but is borderline ICD category.   He had a suggestion of Inferior MI on Myoview last fall, suggesting that the RCA lesions may have been Infarct related at an earlier date.  Perhaps with RCA PCI, his EF will improve even more. Only able to tolerate Carvedilol for now -- as BP improves would consider ARB vs. Entresto No requirement for diuretic - partly b/c less than adequate nutrition over the past few weeks since his MI.

## 2015-06-03 NOTE — Assessment & Plan Note (Signed)
Smoking cessation instruction/counseling given:  commended patient for quitting and reviewed strategies for preventing relapses 

## 2015-06-03 NOTE — Assessment & Plan Note (Addendum)
At this point, he's completed his neuro rehabilitation from his stroke. I think he would benefit well from cardiac rehabilitation. I would like to get him somewhat active bowel outside of the house so he can start getting over the post MI/stroke depression. I do think he really had time for his MI to set in before he had a stroke. It is just overwhelmed by the combination of both events. Completed in-patient Rehab  - will now refer to [facility] as much for Physical activity as for Neuro/Psych status.

## 2015-06-03 NOTE — Assessment & Plan Note (Signed)
Class II, Stage D Combined CHF - but not really all that symptomatic. ? If his DOE is related to his MI/Cardiomyopathy with DHF Sx vs. COPD from long-term smoking combined with deconditioning. Currently only on low dose BB - no room for additional meds. No diuretic requirement

## 2015-06-14 ENCOUNTER — Telehealth: Payer: Self-pay | Admitting: Cardiology

## 2015-06-14 NOTE — Telephone Encounter (Signed)
Picked up overhead page from [place] with [facility]. Patient has home nursing, PT, OT services currently, getting ready to be discharged from PT services. Janett Billow called patient to explain cardiac rehab services and some insurance questions came up. Informed Urban Gibson she will need to contact [facility]

## 2015-06-20 ENCOUNTER — Inpatient Hospital Stay (HOSPITAL_COMMUNITY)
Admission: EM | Admit: 2015-06-20 | Discharge: 2015-06-23 | DRG: 871 | Disposition: A | Discharge status: Home / Self Care | Payer: Medicare Other | Attending: Cardiology | Admitting: Cardiology

## 2015-06-20 ENCOUNTER — Encounter (HOSPITAL_COMMUNITY): Payer: Self-pay | Admitting: Emergency Medicine

## 2015-06-20 ENCOUNTER — Emergency Department (HOSPITAL_COMMUNITY): Payer: Medicare Other

## 2015-06-20 DIAGNOSIS — I5023 Acute on chronic systolic (congestive) heart failure: Secondary | ICD-10-CM | POA: Diagnosis present

## 2015-06-20 DIAGNOSIS — I10 Essential (primary) hypertension: Secondary | ICD-10-CM | POA: Diagnosis not present

## 2015-06-20 DIAGNOSIS — Z87891 Personal history of nicotine dependence: Secondary | ICD-10-CM | POA: Diagnosis not present

## 2015-06-20 DIAGNOSIS — I69318 Other symptoms and signs involving cognitive functions following cerebral infarction: Secondary | ICD-10-CM | POA: Diagnosis not present

## 2015-06-20 DIAGNOSIS — Z7982 Long term (current) use of aspirin: Secondary | ICD-10-CM

## 2015-06-20 DIAGNOSIS — J810 Acute pulmonary edema: Secondary | ICD-10-CM

## 2015-06-20 DIAGNOSIS — I110 Hypertensive heart disease with heart failure: Secondary | ICD-10-CM | POA: Diagnosis present

## 2015-06-20 DIAGNOSIS — I255 Ischemic cardiomyopathy: Secondary | ICD-10-CM | POA: Diagnosis present

## 2015-06-20 DIAGNOSIS — I69398 Other sequelae of cerebral infarction: Secondary | ICD-10-CM | POA: Diagnosis not present

## 2015-06-20 DIAGNOSIS — E785 Hyperlipidemia, unspecified: Secondary | ICD-10-CM

## 2015-06-20 DIAGNOSIS — I739 Peripheral vascular disease, unspecified: Secondary | ICD-10-CM

## 2015-06-20 DIAGNOSIS — H5340 Unspecified visual field defects: Secondary | ICD-10-CM | POA: Diagnosis present

## 2015-06-20 DIAGNOSIS — I2102 ST elevation (STEMI) myocardial infarction involving left anterior descending coronary artery: Secondary | ICD-10-CM

## 2015-06-20 DIAGNOSIS — Z23 Encounter for immunization: Secondary | ICD-10-CM | POA: Diagnosis not present

## 2015-06-20 DIAGNOSIS — R0602 Shortness of breath: Secondary | ICD-10-CM | POA: Diagnosis present

## 2015-06-20 DIAGNOSIS — E872 Acidosis: Secondary | ICD-10-CM | POA: Diagnosis present

## 2015-06-20 DIAGNOSIS — I639 Cerebral infarction, unspecified: Secondary | ICD-10-CM | POA: Diagnosis present

## 2015-06-20 DIAGNOSIS — Z8673 Personal history of transient ischemic attack (TIA), and cerebral infarction without residual deficits: Secondary | ICD-10-CM

## 2015-06-20 DIAGNOSIS — I6523 Occlusion and stenosis of bilateral carotid arteries: Secondary | ICD-10-CM | POA: Diagnosis present

## 2015-06-20 DIAGNOSIS — I2510 Atherosclerotic heart disease of native coronary artery without angina pectoris: Secondary | ICD-10-CM | POA: Diagnosis present

## 2015-06-20 DIAGNOSIS — Z955 Presence of coronary angioplasty implant and graft: Secondary | ICD-10-CM

## 2015-06-20 DIAGNOSIS — A419 Sepsis, unspecified organism: Principal | ICD-10-CM

## 2015-06-20 DIAGNOSIS — I779 Disorder of arteries and arterioles, unspecified: Secondary | ICD-10-CM | POA: Diagnosis present

## 2015-06-20 DIAGNOSIS — Z79899 Other long term (current) drug therapy: Secondary | ICD-10-CM

## 2015-06-20 DIAGNOSIS — R7303 Prediabetes: Secondary | ICD-10-CM | POA: Diagnosis present

## 2015-06-20 DIAGNOSIS — J189 Pneumonia, unspecified organism: Secondary | ICD-10-CM

## 2015-06-20 DIAGNOSIS — J9600 Acute respiratory failure, unspecified whether with hypoxia or hypercapnia: Secondary | ICD-10-CM | POA: Diagnosis present

## 2015-06-20 DIAGNOSIS — Z7902 Long term (current) use of antithrombotics/antiplatelets: Secondary | ICD-10-CM

## 2015-06-20 DIAGNOSIS — I252 Old myocardial infarction: Secondary | ICD-10-CM | POA: Diagnosis not present

## 2015-06-20 DIAGNOSIS — H53461 Homonymous bilateral field defects, right side: Secondary | ICD-10-CM | POA: Diagnosis not present

## 2015-06-20 LAB — CBC WITH DIFFERENTIAL/PLATELET
BASOS ABS: 0.1 10*3/uL (ref 0.0–0.1)
BASOS PCT: 1 %
Eosinophils Absolute: 0.8 10*3/uL — ABNORMAL HIGH (ref 0.0–0.7)
Eosinophils Relative: 7 %
HEMATOCRIT: 36.3 % — AB (ref 39.0–52.0)
HEMOGLOBIN: 11.4 g/dL — AB (ref 13.0–17.0)
LYMPHS PCT: 22 %
Lymphs Abs: 2.4 10*3/uL (ref 0.7–4.0)
MCH: 29.4 pg (ref 26.0–34.0)
MCHC: 31.4 g/dL (ref 30.0–36.0)
MCV: 93.6 fL (ref 78.0–100.0)
MONO ABS: 0.5 10*3/uL (ref 0.1–1.0)
Monocytes Relative: 4 %
NEUTROS ABS: 7 10*3/uL (ref 1.7–7.7)
NEUTROS PCT: 66 %
Platelets: 302 10*3/uL (ref 150–400)
RBC: 3.88 MIL/uL — ABNORMAL LOW (ref 4.22–5.81)
RDW: 15.1 % (ref 11.5–15.5)
WBC: 10.7 10*3/uL — ABNORMAL HIGH (ref 4.0–10.5)

## 2015-06-20 LAB — COMPREHENSIVE METABOLIC PANEL
ALBUMIN: 3.5 g/dL (ref 3.5–5.0)
ALK PHOS: 43 U/L (ref 38–126)
ALT: 24 U/L (ref 17–63)
AST: 29 U/L (ref 15–41)
Anion gap: 12 (ref 5–15)
BILIRUBIN TOTAL: 1.1 mg/dL (ref 0.3–1.2)
BUN: 9 mg/dL (ref 6–20)
CALCIUM: 9 mg/dL (ref 8.9–10.3)
CO2: 18 mmol/L — ABNORMAL LOW (ref 22–32)
CREATININE: 1.46 mg/dL — AB (ref 0.61–1.24)
Chloride: 110 mmol/L (ref 101–111)
GFR calc Af Amer: 54 mL/min — ABNORMAL LOW (ref >60)
GFR, EST NON AFRICAN AMERICAN: 47 mL/min — AB (ref >60)
GLUCOSE: 260 mg/dL — AB (ref 65–99)
Potassium: 4.3 mmol/L (ref 3.5–5.1)
Sodium: 140 mmol/L (ref 135–145)
TOTAL PROTEIN: 7.3 g/dL (ref 6.5–8.1)

## 2015-06-20 LAB — URINALYSIS, ROUTINE W REFLEX MICROSCOPIC
Bilirubin Urine: NEGATIVE
Glucose, UA: NEGATIVE mg/dL
Hgb urine dipstick: NEGATIVE
KETONES UR: NEGATIVE mg/dL
LEUKOCYTES UA: NEGATIVE
NITRITE: NEGATIVE
PH: 5 (ref 5.0–8.0)
PROTEIN: NEGATIVE mg/dL
Specific Gravity, Urine: 1.007 (ref 1.005–1.030)
Urobilinogen, UA: 0.2 mg/dL (ref 0.0–1.0)

## 2015-06-20 LAB — I-STAT TROPONIN, ED: TROPONIN I, POC: 0.06 ng/mL (ref 0.00–0.08)

## 2015-06-20 LAB — I-STAT ARTERIAL BLOOD GAS, ED
Acid-base deficit: 8 mmol/L — ABNORMAL HIGH (ref 0.0–2.0)
BICARBONATE: 18 meq/L — AB (ref 20.0–24.0)
O2 Saturation: 96 %
PCO2 ART: 35.7 mmHg (ref 35.0–45.0)
PO2 ART: 89 mmHg (ref 80.0–100.0)
Patient temperature: 97.3
TCO2: 19 mmol/L (ref 0–100)
pH, Arterial: 7.307 — ABNORMAL LOW (ref 7.350–7.450)

## 2015-06-20 LAB — PROTIME-INR
INR: 1.22 (ref 0.00–1.49)
Prothrombin Time: 15.6 seconds — ABNORMAL HIGH (ref 11.6–15.2)

## 2015-06-20 LAB — I-STAT CG4 LACTIC ACID, ED: LACTIC ACID, VENOUS: 3.71 mmol/L — AB (ref 0.5–2.0)

## 2015-06-20 LAB — BRAIN NATRIURETIC PEPTIDE: B Natriuretic Peptide: 1167.7 pg/mL — ABNORMAL HIGH (ref 0.0–100.0)

## 2015-06-20 LAB — MRSA PCR SCREENING: MRSA by PCR: NEGATIVE

## 2015-06-20 LAB — TROPONIN I
TROPONIN I: 0.17 ng/mL — AB (ref <0.031)
Troponin I: 0.13 ng/mL — ABNORMAL HIGH (ref <0.031)

## 2015-06-20 LAB — LACTIC ACID, PLASMA
LACTIC ACID, VENOUS: 1.9 mmol/L (ref 0.5–2.0)
Lactic Acid, Venous: 1.9 mmol/L (ref 0.5–2.0)

## 2015-06-20 MED ORDER — METHYLPREDNISOLONE SODIUM SUCC 125 MG IJ SOLR
125.0000 mg | Freq: Once | INTRAMUSCULAR | Status: AC
  Administered 2015-06-20: 125 mg via INTRAVENOUS
  Filled 2015-06-20: qty 2

## 2015-06-20 MED ORDER — CLOPIDOGREL BISULFATE 75 MG PO TABS
75.0000 mg | ORAL_TABLET | Freq: Every day | ORAL | Status: DC
  Administered 2015-06-20 – 2015-06-23 (×4): 75 mg via ORAL
  Filled 2015-06-20 (×4): qty 1

## 2015-06-20 MED ORDER — MIRTAZAPINE 15 MG PO TBDP
15.0000 mg | ORAL_TABLET | Freq: Every day | ORAL | Status: DC
  Administered 2015-06-20 – 2015-06-22 (×3): 15 mg via ORAL
  Filled 2015-06-20 (×6): qty 1

## 2015-06-20 MED ORDER — LEVALBUTEROL HCL 1.25 MG/0.5ML IN NEBU
1.2500 mg | INHALATION_SOLUTION | Freq: Four times a day (QID) | RESPIRATORY_TRACT | Status: DC
  Administered 2015-06-21: 1.25 mg via RESPIRATORY_TRACT
  Filled 2015-06-20: qty 0.5

## 2015-06-20 MED ORDER — POTASSIUM CHLORIDE CRYS ER 20 MEQ PO TBCR
20.0000 meq | EXTENDED_RELEASE_TABLET | Freq: Every day | ORAL | Status: DC
  Administered 2015-06-21 – 2015-06-23 (×3): 20 meq via ORAL
  Filled 2015-06-20 (×3): qty 1

## 2015-06-20 MED ORDER — MEGESTROL ACETATE 400 MG/10ML PO SUSP
200.0000 mg | Freq: Three times a day (TID) | ORAL | Status: DC
  Filled 2015-06-20 (×8): qty 5

## 2015-06-20 MED ORDER — SODIUM CHLORIDE 0.9 % IJ SOLN
3.0000 mL | Freq: Two times a day (BID) | INTRAMUSCULAR | Status: DC
Start: 2015-06-20 — End: 2015-06-23
  Administered 2015-06-20 – 2015-06-23 (×7): 3 mL via INTRAVENOUS

## 2015-06-20 MED ORDER — IPRATROPIUM BROMIDE 0.02 % IN SOLN
0.5000 mg | Freq: Four times a day (QID) | RESPIRATORY_TRACT | Status: DC
  Administered 2015-06-21: 0.5 mg via RESPIRATORY_TRACT
  Filled 2015-06-20: qty 2.5

## 2015-06-20 MED ORDER — ENSURE ENLIVE PO LIQD
237.0000 mL | Freq: Three times a day (TID) | ORAL | Status: DC
  Administered 2015-06-21 – 2015-06-22 (×2): 237 mL via ORAL

## 2015-06-20 MED ORDER — NITROGLYCERIN IN D5W 200-5 MCG/ML-% IV SOLN
5.0000 ug/min | Freq: Once | INTRAVENOUS | Status: AC
  Administered 2015-06-20: 5 ug/min via INTRAVENOUS
  Filled 2015-06-20: qty 250

## 2015-06-20 MED ORDER — SODIUM CHLORIDE 0.9 % IV SOLN: 250.0000 mL | INTRAVENOUS | Status: DC | PRN

## 2015-06-20 MED ORDER — FUROSEMIDE 10 MG/ML IJ SOLN: 40.0000 mg | Freq: Every day | INTRAMUSCULAR | Status: DC

## 2015-06-20 MED ORDER — DEXTROSE 5 % IV SOLN
1.0000 g | INTRAVENOUS | Status: DC
  Administered 2015-06-20 – 2015-06-23 (×4): 1 g via INTRAVENOUS
  Filled 2015-06-20 (×4): qty 10

## 2015-06-20 MED ORDER — DEXTROSE 5 % IV SOLN
500.0000 mg | INTRAVENOUS | Status: DC
  Administered 2015-06-20 – 2015-06-21 (×2): 500 mg via INTRAVENOUS
  Filled 2015-06-20 (×3): qty 500

## 2015-06-20 MED ORDER — SODIUM CHLORIDE 0.9 % IJ SOLN: 3.0000 mL | INTRAMUSCULAR | Status: DC | PRN

## 2015-06-20 MED ORDER — INFLUENZA VAC SPLIT QUAD 0.5 ML IM SUSY
0.5000 mL | PREFILLED_SYRINGE | INTRAMUSCULAR | Status: AC
  Administered 2015-06-22: 0.5 mL via INTRAMUSCULAR
  Filled 2015-06-20 (×2): qty 0.5

## 2015-06-20 MED ORDER — ASPIRIN EC 81 MG PO TBEC
81.0000 mg | DELAYED_RELEASE_TABLET | Freq: Every day | ORAL | Status: DC
  Administered 2015-06-20 – 2015-06-23 (×4): 81 mg via ORAL
  Filled 2015-06-20 (×4): qty 1

## 2015-06-20 MED ORDER — FUROSEMIDE 10 MG/ML PO SOLN
40.0000 mg | Freq: Once | ORAL | Status: DC
  Filled 2015-06-20: qty 5

## 2015-06-20 MED ORDER — FUROSEMIDE 10 MG/ML IJ SOLN
40.0000 mg | Freq: Two times a day (BID) | INTRAMUSCULAR | Status: DC
  Administered 2015-06-20 – 2015-06-22 (×6): 40 mg via INTRAVENOUS
  Filled 2015-06-20 (×6): qty 4

## 2015-06-20 MED ORDER — CARVEDILOL 6.25 MG PO TABS
9.3750 mg | ORAL_TABLET | Freq: Two times a day (BID) | ORAL | Status: DC
  Administered 2015-06-20 – 2015-06-23 (×7): 9.375 mg via ORAL
  Filled 2015-06-20 (×8): qty 1

## 2015-06-20 MED ORDER — ACETAMINOPHEN 325 MG PO TABS: 650.0000 mg | ORAL_TABLET | ORAL | Status: DC | PRN

## 2015-06-20 MED ORDER — ONDANSETRON HCL 4 MG/2ML IJ SOLN: 4.0000 mg | Freq: Four times a day (QID) | INTRAMUSCULAR | Status: DC | PRN

## 2015-06-20 MED ORDER — IPRATROPIUM BROMIDE 0.02 % IN SOLN
0.5000 mg | Freq: Four times a day (QID) | RESPIRATORY_TRACT | Status: DC
  Administered 2015-06-20 (×2): 0.5 mg via RESPIRATORY_TRACT
  Filled 2015-06-20 (×3): qty 2.5

## 2015-06-20 MED ORDER — ROSUVASTATIN CALCIUM 10 MG PO TABS
40.0000 mg | ORAL_TABLET | Freq: Every day | ORAL | Status: DC
  Administered 2015-06-20 – 2015-06-23 (×4): 40 mg via ORAL
  Filled 2015-06-20 (×4): qty 4

## 2015-06-20 MED ORDER — FUROSEMIDE 10 MG/ML IJ SOLN
40.0000 mg | Freq: Once | INTRAMUSCULAR | Status: AC
  Administered 2015-06-20: 40 mg via INTRAVENOUS
  Filled 2015-06-20: qty 4

## 2015-06-20 MED ORDER — NITROGLYCERIN 0.4 MG SL SUBL: 0.4000 mg | SUBLINGUAL_TABLET | SUBLINGUAL | Status: DC | PRN

## 2015-06-20 MED ORDER — LEVALBUTEROL HCL 0.63 MG/3ML IN NEBU: 0.6300 mg | INHALATION_SOLUTION | Freq: Four times a day (QID) | RESPIRATORY_TRACT | Status: DC | PRN

## 2015-06-20 MED ORDER — LEVALBUTEROL HCL 1.25 MG/0.5ML IN NEBU
1.2500 mg | INHALATION_SOLUTION | Freq: Four times a day (QID) | RESPIRATORY_TRACT | Status: DC
  Administered 2015-06-20 (×2): 1.25 mg via RESPIRATORY_TRACT
  Filled 2015-06-20 (×8): qty 0.5

## 2015-06-20 MED ORDER — PANTOPRAZOLE SODIUM 40 MG PO TBEC
40.0000 mg | DELAYED_RELEASE_TABLET | Freq: Two times a day (BID) | ORAL | Status: DC
  Administered 2015-06-20 – 2015-06-23 (×6): 40 mg via ORAL
  Filled 2015-06-20 (×6): qty 1

## 2015-06-20 MED ORDER — IPRATROPIUM-ALBUTEROL 0.5-2.5 (3) MG/3ML IN SOLN
3.0000 mL | Freq: Once | RESPIRATORY_TRACT | Status: AC
  Administered 2015-06-20: 3 mL via RESPIRATORY_TRACT
  Filled 2015-06-20: qty 3

## 2015-06-20 NOTE — ED Provider Notes (Signed)
CSN: [id number]     Arrival date & time 06/20/15  [id number] History   First MD Initiated Contact with Patient 06/20/15 [phone]     Chief Complaint  Patient presents with  . Shortness of Breath     (Consider location/radiation/quality/duration/timing/severity/associated sxs/prior Treatment) HPI Comments: Patient is a 69 year old male with history of recent MI with stenting 2 to the LAD. He woke this morning with severe shortness of breath. He denies any chest pain, fever, cough. He denies any swelling in his legs. He is found to be hypoxic by EMS and placed on oxygen. He was transported here in significant respiratory distress. He has little to history due to severity of illness.  Patient is a 69 y.o. male presenting with shortness of breath. The history is provided by the patient.  Shortness of Breath Severity:  Severe Onset quality:  Sudden Duration:  2 hours Timing:  Constant Progression:  Worsening Chronicity:  New Context: activity   Relieved by:  Nothing Worsened by:  Nothing tried Ineffective treatments:  None tried Associated symptoms: no chest pain, no cough and no fever     Past Medical History  Diagnosis Date  . ST elevation myocardial infarction (STEMI) involving left anterior descending (LAD) coronary artery with complication ([place]) [id number]    100% Prox LAD - PCI with overlapping DES x 2  . Coronary artery disease involving native heart 05/11/2015    Cath  three-vessel CAD with 90% prox RCA, 80% mid RCA, 80% OM3, 100% prox LAD occlusion treated with DESx2. RCA and OM residual treated medically for now, however will need PCI if has recurrent CP  . CAD S/P percutaneous coronary angioplasty 05/11/2015    PCI to Prox-Mid LAD with Overlapping Promus Premier DES 3.0 x 38 & 3.0 x 16 (post-dilated to 3.5 mm)   . Ischemic cardiomyopathy 05/11/2015    EF 40% on cath 05/11/2015 after anterior STEMI, EF 20-25% on echo 05/13/2015;   . Carotid arterial disease ([place])   . Hyperlipidemia with  target LDL less than 70   . Essential hypertension   . 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  MRI Brain: Acute L Occipital Lobe Infarct. Chronic microvascular ischemic changes in the white matter and left pons.  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 Carotid arterial disease ([place]) 06/2014; 01/09/2015    Followed by Dr. Donnetta Hutching of [facility] -- a) Carotid u/s: 40-59% bilat ICA stenosis; b) Mod 50-69% Bilateral R Int Carotid (heteroenous plaque), Normal L Vert A, unable to find R Vert A.   . Tobacco abuse     a. 30 yrs - 1.5 ppd. - Quit 05/11/2015  . Prediabetes Oct 2016    A1C 6.0 in Oct 2016  . H/O: GI bleed 05/15/2015    s/p Sigmoid Polypectomy 05/04/2015 - prior to STEMI on May 11, 2015; Had GI Bleed following TPA for CVA, while on ASA & Brilinta; Flex Sig - 1. Internal Hemorrhoids, 2. Distal sigmoid polypectomy site with flat [place] status post biopsy and tattoo with 30mL spot over 3 injections 3. Proximal sigmoid polypectomy site seen as well, 4) otherwise normal with no evidence of bleed  . Chronic combined systolic and diastolic congestive heart failure, NYHA class 2 ([place]) 05/11/2015   Past Surgical History  Procedure Laterality Date  . S/p appendectomy      Age 44  . S/p inguinal hernia repair  In his 64's.  . Cardiac catheterization N/A 05/11/2015    Procedure: Left Heart Cath and Coronary Angiography;  Surgeon: Peter M Martinique, MD;  Location: [place];  Service: Cardiovascular;  100% pLAD (long lesion). RCA - prox 90%, mid 80%. OM2 & OM3 80% (OM2 & 3 not necessarily PCI targets); EF 35-45% with Anterio HK  . Cardiac catheterization N/A 05/11/2015    Procedure: Coronary Stent Intervention;  Surgeon: Peter M Martinique, MD;  Location: [place] [facility];  Service: Cardiovascular; PCI to Prox-Mid LAD with Overlapping Promus Premier DES 3.0 x 38 & 3.0 x 16 (post-dilated to 3.5 mm)    . Flexible  sigmoidoscopy N/A 05/18/2015    Procedure: FLEXIBLE SIGMOIDOSCOPY;  Surgeon: Clarene Essex, MD;  Location: [facility];  Service: Endoscopy;  Laterality: N/A;  . Transthoracic echocardiogram  05/13/2015    Mlid LVH. EF 20-25% - Akinesis of mid-Apical Anterior, basal-mid AnteroSeptal, Mid Anterolateral, apical septal, apical-lateral & apical  walls.  Abnormal Diastolic function - indeterminate  . Transthoracic echocardiogram  05/15/2015    Mild LVH, EF 35-40% - Mod HK of mid-apical anteroseptal, anterior & apical walls.  Gr 1 DD. Mild bilateral Atrial Enlargement.   Family History  Problem Relation Age of Onset  . Adopted: Yes  . Other      Adopted - unaware of biological parent's histories.  . Other Mother     eczema   . Cancer Mother    Social History  Substance Use Topics  . Smoking status: Former Smoker -- 1.00 packs/day for 30 years    Types: Cigarettes    Quit date: 05/11/2015  . Smokeless tobacco: None     Comment: Quit the day of his STEMI  . Alcohol Use: No    Review of Systems  Constitutional: Negative for fever.  Respiratory: Positive for shortness of breath. Negative for cough.   Cardiovascular: Negative for chest pain.  All other systems reviewed and are negative.     Allergies  Latex  Home Medications   Prior to Admission medications   Medication Sig Start Date End Date Taking? Authorizing Provider  aspirin EC 81 MG tablet Take 1 tablet (81 mg total) by mouth daily. 05/26/15   Bary Leriche, PA-C  carvedilol (COREG) 3.125 MG tablet Take 3 tablets (9.375 mg total) by mouth 2 (two) times daily with a meal. 05/14/15   Almyra Deforest, PA  clopidogrel (PLAVIX) 75 MG tablet Take 1 tablet (75 mg total) by mouth daily. 05/26/15   Bary Leriche, PA-C  feeding supplement, ENSURE ENLIVE, (ENSURE ENLIVE) LIQD Take 237 mLs by mouth 3 (three) times daily between meals. 05/26/15   Bary Leriche, PA-C  megestrol (MEGACE) 400 MG/10ML suspension Take 5 mLs (200 mg total) by mouth 3  (three) times daily before meals. To help with appetite 05/26/15   Ivan Anchors Love, PA-C  mirtazapine (REMERON SOL-TAB) 15 MG disintegrating tablet Take 1 tablet (15 mg total) by mouth at bedtime. 05/26/15   Bary Leriche, PA-C  nitroGLYCERIN (NITROSTAT) 0.4 MG SL tablet Place 1 tablet (0.4 mg total) under the tongue every 5 (five) minutes x 3 doses as needed for chest pain. 05/14/15   Almyra Deforest, PA  pantoprazole (PROTONIX) 40 MG tablet Take 40 mg by mouth 2 (two) times daily.    Historical Provider, MD  rosuvastatin (CRESTOR) 40 MG tablet Take 1 tablet (40 mg total) by mouth daily at 6 PM. 05/14/15   Almyra Deforest, PA   BP  155/88 mmHg  Pulse 94  Temp(Src) 97.3 F (36.3 C) (Axillary)  Resp 22  SpO2 99% Physical Exam  Constitutional: He is oriented to person, place, and time. He appears well-developed and well-nourished. He appears distressed.  Patient appears acutely ill. He is in significant respiratory distress.  HENT:  Head: Normocephalic and atraumatic.  Neck: Normal range of motion. Neck supple.  Cardiovascular: Normal rate, regular rhythm and normal heart sounds.   No murmur heard. Pulmonary/Chest: He is in respiratory distress. He has rales.  Patient is in significant respiratory distress. He appears pale. There are rales in the bases bilaterally.  Abdominal: Soft. Bowel sounds are normal. He exhibits no distension. There is no tenderness.  Musculoskeletal: Normal range of motion. He exhibits no edema.  Neurological: He is alert and oriented to person, place, and time.  Skin: Skin is warm and dry.  Nursing note and vitals reviewed.   ED Course  Procedures (including critical care time) Labs Review Labs Reviewed  BRAIN NATRIURETIC PEPTIDE  CBC WITH DIFFERENTIAL/PLATELET  COMPREHENSIVE METABOLIC PANEL  PROTIME-INR  I-STAT [place], ED  I-STAT CG4 LACTIC ACID, ED    Imaging Review No results found. I have personally reviewed and evaluated these images and lab results as part of  my medical decision-making.   EKG Interpretation   Date/Time:  Tuesday June 20 2015 06:59:45 EST Ventricular Rate:  93 PR Interval:  196 QRS Duration: 90 QT Interval:  384 QTC Calculation: 478 R Axis:   49 Text Interpretation:  Sinus rhythm Probable left atrial enlargement  Anterior infarct, old Borderline repolarization abnormality Confirmed by  Damian Hofstra  MD, Matilyn Fehrman ([id number]) on 06/20/2015 7:55:18 AM      MDM   Final diagnoses:  None    Patient is a 69 year old male with recent MI with stent in the LAD. He became acutely short of breath this morning and presented here in significant respiratory distress. He was hypertensive and chest x-ray is suggestive of pulmonary edema. He was placed on a nitroglycerin drip, given IV Lasix, and improved significantly with this intervention. He remains on BiPAP, however is much more comfortable, less tachypnea, and oxygen saturations are 100%. His blood pressure has improved and the nitroglycerin drip was eventually discontinued.  I have consulted cardiology who will evaluate and admit the patient.  CRITICAL CARE Performed by: Veryl Speak Total critical care time: 60 minutes Critical care time was exclusive of separately billable procedures and treating other patients. Critical care was necessary to treat or prevent imminent or life-threatening deterioration. Critical care was time spent personally by me on the following activities: development of treatment plan with patient and/or surrogate as well as nursing, discussions with consultants, evaluation of patient's response to treatment, examination of patient, obtaining history from patient or surrogate, ordering and performing treatments and interventions, ordering and review of laboratory studies, ordering and review of radiographic studies, pulse oximetry and re-evaluation of patient's condition.     Veryl Speak, MD 06/20/15 [phone]

## 2015-06-20 NOTE — ED Notes (Signed)
Spoke with Cardiology PA- pt feeling much better and would like to come off Bipap. Pa ok with pt trialing [place]. Took pt off bipap, placed pt on 3L [place]. Sats 95%. Pt resting comfortably

## 2015-06-20 NOTE — ED Notes (Signed)
Gave pt [place] sandwich per, [place]

## 2015-06-20 NOTE — H&P (Signed)
Patient ID: Mondarius Lanson MRN: [id number], DOB/AGE: [id number]   Admit date: 06/20/2015   Primary Physician: [name], MD Primary Cardiologist: Dr. Ellyn Hack  Pt. Profile:  69 year old Caucasian male with past medical history of hyperlipidemia, prediabetes, hypertension, tobacco abuse, moderate carotid arteries disease and recent complicated course of CAD s/p anterior STEMI followed by cardioembolic CVA and LGIB presented with acute dyspnea that woke him up from sleep around 4AM this morning.   Problem List  Past Medical History  Diagnosis Date  . ST elevation myocardial infarction (STEMI) involving left anterior descending (LAD) coronary artery with complication ([place]) [id number]    100% Prox LAD - PCI with overlapping DES x 2  . Coronary artery disease involving native heart 05/11/2015    Cath  three-vessel CAD with 90% prox RCA, 80% mid RCA, 80% OM3, 100% prox LAD occlusion treated with DESx2. RCA and OM residual treated medically for now, however will need PCI if has recurrent CP  . CAD S/P percutaneous coronary angioplasty 05/11/2015    PCI to Prox-Mid LAD with Overlapping Promus Premier DES 3.0 x 38 & 3.0 x 16 (post-dilated to 3.5 mm)   . Ischemic cardiomyopathy 05/11/2015    EF 40% on cath 05/11/2015 after anterior STEMI, EF 20-25% on echo 05/13/2015;   . Carotid arterial disease ([place])   . Hyperlipidemia with target LDL less than 70   . Essential hypertension   . 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 ([place]) 05/14/2015    -- Given TPA.  MRI Brain: Acute L Occipital Lobe Infarct. Chronic microvascular ischemic changes in the white matter and left pons.  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 Carotid arterial disease ([place]) 06/2014; 01/09/2015    Followed by Dr. Donnetta Hutching of [facility] -- a) Carotid u/s: 40-59% bilat ICA stenosis; b) Mod 50-69% Bilateral R Int Carotid (heteroenous plaque), Normal L Vert A, unable to find R Vert  A.   . Tobacco abuse     a. 30 yrs - 1.5 ppd. - Quit 05/11/2015  . Prediabetes Oct 2016    A1C 6.0 in Oct 2016  . H/O: GI bleed 05/15/2015    s/p Sigmoid Polypectomy 05/04/2015 - prior to STEMI on May 11, 2015; Had GI Bleed following TPA for CVA, while on ASA & Brilinta; Flex Sig - 1. Internal Hemorrhoids, 2. Distal sigmoid polypectomy site with flat [place] status post biopsy and tattoo with 54mL spot over 3 injections 3. Proximal sigmoid polypectomy site seen as well, 4) otherwise normal with no evidence of bleed  . Chronic combined systolic and diastolic congestive heart failure, NYHA class 2 ([place]) 05/11/2015    Past Surgical History  Procedure Laterality Date  . S/p appendectomy      Age 82  . S/p inguinal hernia repair      In his 20's.  . Cardiac catheterization N/A 05/11/2015    Procedure: Left Heart Cath and Coronary Angiography;  Surgeon: Peter M Martinique, MD;  Location: [facility];  Service: Cardiovascular;  100% pLAD (long lesion). RCA - prox 90%, mid 80%. OM2 & OM3 80% (OM2 & 3 not necessarily PCI targets); EF 35-45% with Anterio HK  . Cardiac catheterization N/A 05/11/2015    Procedure: Coronary Stent Intervention;  Surgeon: Peter M Martinique, MD;  Location: [facility];  Service: Cardiovascular; PCI to Prox-Mid LAD with Overlapping Promus Premier DES 3.0 x 38 & 3.0 x 16 (post-dilated to 3.5 mm)    .  Flexible sigmoidoscopy N/A 05/18/2015    Procedure: FLEXIBLE SIGMOIDOSCOPY;  Surgeon: Clarene Essex, MD;  Location: [facility];  Service: Endoscopy;  Laterality: N/A;  . Transthoracic echocardiogram  05/13/2015    Mlid LVH. EF 20-25% - Akinesis of mid-Apical Anterior, basal-mid AnteroSeptal, Mid Anterolateral, apical septal, apical-lateral & apical  walls.  Abnormal Diastolic function - indeterminate  . Transthoracic echocardiogram  05/15/2015    Mild LVH, EF 35-40% - Mod HK of mid-apical anteroseptal, anterior & apical walls.  Gr 1 DD. Mild bilateral Atrial Enlargement.      Allergies  Allergies  Allergen Reactions  . Latex Itching and Rash    HPI  The patient is a 69 year old Caucasian male with past medical history of hyperlipidemia, prediabetes, hypertension, tobacco abuse, moderate carotid arteries disease and recent complicated course of CAD s/p anterior STEMI followed by cardioembolic CVA and LGIB. He was originally seen in consultation in November 2015 for atypical chest pain, Myoview suggestive of prior inferior MI, low risk due to no ischemia. He was seen in May 2016 with exertional chest tightness. The plan was evacuate with GXT, however this did not occur. He came to the hospital on 05/11/2015 with anterior STEMI, cardiac catheterization at that time showed three-vessel CAD with 90% proximal RCA, 80% mid RCA, 80% OM3, 100% prox LAD occlusion treated with DESx2. Echocardiogram at the time showed EF 20-25% with akinesis of mid-apical anterior, basal mid anteroseptal, mid anterolateral, apical septal, apical lateral and apical myocardium. As for his residual RCA is disease, it was medical managed with plan of PCI if has recurrent symptoms. He was discharged on 05/14/2015. Unfortunately 2 hours after discharge, he had acute onset of confusion and decreased vision in the right visual field concerning for L PCA CVA. CT scan did not show any cranial abnormality. He was treated in the ED with TPA, unfortunately he developed acute lower GI bleed with dropping hemoglobin requiring transfusion with FFP. Follow-up MRI obtained on 05/15/2015 showed acute infarct in the left occipital lobe with chronic microvascular ischemic changes. He continued to have residual right visual field deficit since then. Echocardiogram obtained on 05/15/2015 showed EF 35-40%, moderate hypokinesis of mid-apical anteroseptal myocardium, moderate hypokinesis of anterior and apical myocardium, grade 1 diastolic dysfunction. His Brilinta was switched to Plavix. He was later discharged to inpatient  rehabilitation and was released from rehabilitation on 10/21.  He was last seen in the office on 06/01/2015 by Dr. Ellyn Hack, at which time he was doing well without significant discomfort. His weight at the time was 184 pounds. On his discharge in early October, he was instructed regarding heart failure signs and symptom. He was also discharged on PRN lasix at the time for weight gain and increasing shortness of breath, however Lasix prescription has been discontinued since then for unknown reason. Since his previous MI, he has not had any recurrent chest pain. He was in his usual state of health until he woke up around 4 AM on 06/20/2015 feeling he couldn't breathe. He had a significant orthopnea and paroxysmal nocturnal dyspnea. He denies any recent fever, however does admit to have some chills which he has attributed to the blood thinner(?). He sought medical attention at [facility], on arrival he was noted to be hypoxic with O2 saturation around 79%. He was placed on BiPAP. Chest x-ray was concerning for acute heart failure/pulmonary edema. Lactic acid was 3.71. Troponin was negative. BNP was elevated at 1167.7. Creatinine was elevated at 1.46 was his baseline was less than  1.0. Hemoglobin was 11.4. Blood cell count 10.7. Cardiology has been consulted for acute heart failure.   Home Medications  Prior to Admission medications   Medication Sig Start Date End Date Taking? Authorizing Provider  aspirin EC 81 MG tablet Take 1 tablet (81 mg total) by mouth daily. 05/26/15   Bary Leriche, PA-C  carvedilol (COREG) 3.125 MG tablet Take 3 tablets (9.375 mg total) by mouth 2 (two) times daily with a meal. 05/14/15   Almyra Deforest, PA  clopidogrel (PLAVIX) 75 MG tablet Take 1 tablet (75 mg total) by mouth daily. 05/26/15   Bary Leriche, PA-C  feeding supplement, ENSURE ENLIVE, (ENSURE ENLIVE) LIQD Take 237 mLs by mouth 3 (three) times daily between meals. 05/26/15   Bary Leriche, PA-C  megestrol (MEGACE)  400 MG/10ML suspension Take 5 mLs (200 mg total) by mouth 3 (three) times daily before meals. To help with appetite 05/26/15   Ivan Anchors Love, PA-C  mirtazapine (REMERON SOL-TAB) 15 MG disintegrating tablet Take 1 tablet (15 mg total) by mouth at bedtime. 05/26/15   Bary Leriche, PA-C  nitroGLYCERIN (NITROSTAT) 0.4 MG SL tablet Place 1 tablet (0.4 mg total) under the tongue every 5 (five) minutes x 3 doses as needed for chest pain. 05/14/15   Almyra Deforest, PA  pantoprazole (PROTONIX) 40 MG tablet Take 40 mg by mouth 2 (two) times daily.    Historical Provider, MD  rosuvastatin (CRESTOR) 40 MG tablet Take 1 tablet (40 mg total) by mouth daily at 6 PM. 05/14/15   Almyra Deforest, PA    Family History  Family History  Problem Relation Age of Onset  . Adopted: Yes  . Other      Adopted - unaware of biological parent's histories.  . Other Mother     eczema   . Cancer Mother     Social History  Social History   Social History  . Marital Status: Married    Spouse Name: N/A  . Number of Children: 3  . Years of Education: N/A   Occupational History  . retired from [employer]  . Smoking status: Former Smoker -- 1.00 packs/day for 30 years    Types: Cigarettes    Quit date: 05/11/2015  . Smokeless tobacco: Not on file     Comment: Quit the day of his STEMI  . Alcohol Use: No  . Drug Use: No  . Sexual Activity: Not on file   Other Topics Concern  . Not on file   Social History Narrative   Lives in [place] with wife. Originally from [place].     Review of Systems General:  No chills, fever, night sweats or weight changes.  Cardiovascular:  No chest pain, dyspnea on exertion, edema, palpitations +sudden onset of orthopnea, paroxysmal nocturnal dyspnea. Dermatological: No rash, lesions/masses Respiratory: No cough +dyspnea Urologic: No hematuria, dysuria Abdominal:   No nausea, vomiting, diarrhea, bright red blood per rectum, melena, or  hematemesis Neurologic:  No visual changes, wkns, changes in mental status. All other systems reviewed and are otherwise negative except as noted above.  Physical Exam  Blood pressure 92/57, pulse 79, temperature 97.3 F (36.3 C), temperature source Axillary, resp. rate 13, SpO2 100 %.  General: Pleasant, NAD Psych: Normal affect. Neuro: Alert and oriented X 3. Moves all extremities spontaneously. HEENT: Normal  Neck: Supple without bruits or JVD. Lungs:  Resp regular and unlabored. Diminished breath sound in bibasilar rale. Heart: RRR  no s3, s4, or murmurs. Abdomen: Soft, non-tender, non-distended, BS + x 4.  Extremities: No clubbing, cyanosis or edema. DP/PT/Radials 2+ and equal bilaterally.  Labs  Troponin [facility] of Care Test)  Recent Labs  06/20/15 0732  TROPIPOC 0.06   No results for input(s): CKTOTAL, CKMB, TROPONINI in the last 72 hours. Lab Results  Component Value Date   WBC 10.7* 06/20/2015   HGB 11.4* 06/20/2015   HCT 36.3* 06/20/2015   MCV 93.6 06/20/2015   PLT 302 06/20/2015    Recent Labs Lab 06/20/15 0727  NA 140  K 4.3  CL 110  CO2 18*  BUN 9  CREATININE 1.46*  CALCIUM 9.0  PROT 7.3  BILITOT 1.1  ALKPHOS 43  ALT 24  AST 29  GLUCOSE 260*   Lab Results  Component Value Date   CHOL 163 05/15/2015   HDL 23* 05/15/2015   LDLCALC 111* 05/15/2015   TRIG 144 05/15/2015   Lab Results  Component Value Date   DDIMER 0.63* 06/22/2014     Radiology/Studies  Dg Abd 1 View  05/23/2015  CLINICAL DATA:  Heart attack and stroke EXAM: ABDOMEN - 1 VIEW COMPARISON:  None. FINDINGS: No disproportionate dilatation of bowel. No obvious free intraperitoneal gas. Unremarkable bony framework. Calcified granuloma is suspected in the right lobe of the liver. IMPRESSION: Nonobstructive bowel gas pattern. Electronically Signed   By: Marybelle Killings M.D.   On: 05/23/2015 15:20   Dg Chest Port 1 View  06/20/2015  CLINICAL DATA:  Acute onset severe shortness of  breath this morning. Smoker with a history of coronary artery disease. Initial encounter. EXAM: PORTABLE CHEST 1 VIEW COMPARISON:  Single view of the chest 05/11/2015. FINDINGS: There has been marked worsening of bilateral airspace disease that appears most compatible pulmonary edema. Left pleural effusion is noted. Heart size is mildly enlarged. IMPRESSION: Marked worsening of bilateral airspace disease most consistent with pulmonary edema. Associated left effusion is noted. Electronically Signed   By: Inge Rise M.D.   On: 06/20/2015 07:41    ECG  NSR with TWI in lateral leads  Echocardiogram 05/15/2015  LV EF: 35% -  40%  ------------------------------------------------------------------- Indications:   CVA 436.  ------------------------------------------------------------------- History:  PMH: Stroke with cerebral ischemia. Pre-syncope. PMH: Myocardial infarction. Risk factors: Current tobacco use. Hypertension. Dyslipidemia.  ------------------------------------------------------------------- Study Conclusions  - Left ventricle: The cavity size was normal. Wall thickness was increased in a pattern of mild LVH. Systolic function was moderately reduced. The estimated ejection fraction was in the range of 35% to 40%. There is moderate hypokinesis of the mid-apicalanteroseptal myocardium. There is moderate hypokinesis of the anterior and apical myocardium. Doppler parameters are consistent with abnormal left ventricular relaxation (grade 1 diastolic dysfunction). - Left atrium: The atrium was mildly dilated. - Right atrium: The atrium was mildly dilated.     ASSESSMENT AND PLAN  1. Acute respiratory failure likely 2/2 acute on chronic systolic HF although cannot r/o PNA  - trend trop, doubt ACS given lack of CP (he had angina with prior anterior STEMI in 05/2015)   2. Acute on chronic systolic HF with baseline EF 35%  - echo 05/13/2015 EF 20-25%  with akinesis of mid-apical anterior, basal mid anteroseptal, mid anterolateral, apical septal, apical lateral and apical myocardium  - Echo 05/15/2015 EF 35-40%, moderate hypokinesis of mid-apical anteroseptal myocardium, moderate hypokinesis of anterior and apical myocardium, grade 1 diastolic dysfunction  - given 40mg  IV lasix in ED, will continue 40mg  daily IV lasix to  see response. Also received solu-medrol and duoneb  3. Lactic acidosis, lactic acid 3.7, cannot r/o PNA  - discussed with MD, will prophylactically treat with abx  - will obtain repeat lactic acid 3 hours apart to check trend  - obtain UA  4. CAD s/p DES x 2 to LAD, 80-90% long residual in RCA  - may consider PCI to RCA later, however will hold off and treat HF and possible PNA first  5. L PCA CVA on 05/14/2015 with persistent R visual field deficit   6. Lower GI bleed after receive TPA for CVA in 05/2015  - polypectomy 2 weeks prior to STEMI in early Oct 2016  - flex sig 05/18/2015 after had LGIB after TPA for CVA, no source of bleeding found, previous polypectomy site stable, small internal hemorrhoid present  7. Hyperlipidemia 8. Prediabetes 9. Hypertension 10. tobacco abuse 11. moderate carotid arteries disease   Signed, Almyra Deforest, PA-C 06/20/2015, 9:07 AM  The patient was seen, examined and discussed with Almyra Deforest, PA-C and I agree with the above.   69 year old Caucasian male with past medical history of hyperlipidemia, prediabetes, hypertension, tobacco abuse, moderate carotid arteries disease and recent complicated course of CAD s/p anterior STEMI followed by cardioembolic CVA and LGIB presented with acute dyspnea that woke him up from sleep around 4AM this morning. The patient is fluid overloaded and wheezing on physical exam and complains of SOB no CP, he noticed chills but no fever at home. He has leukocytosis, lactic acid 3.7 and elevated crea. We will treat aggressively for presumed CAP, acute on chronic  systolic CHF. Possible cardiac cath to treat RCA in the future, not right now as he is septic, has normal troponin and acute renal failure.   Dorothy Spark 06/20/2015

## 2015-06-20 NOTE — Progress Notes (Signed)
Pt is now BiPAP at this time. Pt is stable no complications noted.

## 2015-06-20 NOTE — Progress Notes (Signed)
[facility]  Patient Status: Active (receiving services up to time of hospitalization)  [facility] is providing the following services: RN and ST  If patient discharges after hours, please call [phone].   Damon Walker 06/20/2015, 3:08 PM

## 2015-06-20 NOTE — ED Notes (Addendum)
Pt. woke up this morning with SOB , O2 sat= 79% prior to arrival , diaphoresis , denies chest pain , received 1 NTG sl / CPAP for 10 mins but unable to tolerate . Arrived on 10 lpm/[place] .

## 2015-06-21 DIAGNOSIS — I255 Ischemic cardiomyopathy: Secondary | ICD-10-CM

## 2015-06-21 DIAGNOSIS — I779 Disorder of arteries and arterioles, unspecified: Secondary | ICD-10-CM

## 2015-06-21 DIAGNOSIS — I639 Cerebral infarction, unspecified: Secondary | ICD-10-CM

## 2015-06-21 DIAGNOSIS — Z720 Tobacco use: Secondary | ICD-10-CM

## 2015-06-21 LAB — BASIC METABOLIC PANEL
Anion gap: 11 (ref 5–15)
BUN: 17 mg/dL (ref 6–20)
CO2: 25 mmol/L (ref 22–32)
CREATININE: 1.49 mg/dL — AB (ref 0.61–1.24)
Calcium: 9.3 mg/dL (ref 8.9–10.3)
Chloride: 105 mmol/L (ref 101–111)
GFR calc non Af Amer: 45 mL/min — ABNORMAL LOW (ref >60)
GFR, EST AFRICAN AMERICAN: 53 mL/min — AB (ref >60)
Glucose, Bld: 131 mg/dL — ABNORMAL HIGH (ref 65–99)
Potassium: 3.7 mmol/L (ref 3.5–5.1)
SODIUM: 141 mmol/L (ref 135–145)

## 2015-06-21 LAB — TROPONIN I: TROPONIN I: 0.18 ng/mL — AB (ref <0.031)

## 2015-06-21 MED ORDER — CETYLPYRIDINIUM CHLORIDE 0.05 % MT LIQD
7.0000 mL | Freq: Two times a day (BID) | OROMUCOSAL | Status: DC
  Administered 2015-06-21 – 2015-06-22 (×4): 7 mL via OROMUCOSAL

## 2015-06-21 MED ORDER — CETYLPYRIDINIUM CHLORIDE 0.05 % MT LIQD: 7.0000 mL | Freq: Two times a day (BID) | OROMUCOSAL | Status: DC

## 2015-06-21 MED ORDER — IPRATROPIUM BROMIDE 0.02 % IN SOLN
0.5000 mg | Freq: Four times a day (QID) | RESPIRATORY_TRACT | Status: DC
  Administered 2015-06-21 – 2015-06-22 (×4): 0.5 mg via RESPIRATORY_TRACT
  Filled 2015-06-21 (×4): qty 2.5

## 2015-06-21 MED ORDER — LEVALBUTEROL HCL 1.25 MG/0.5ML IN NEBU
1.2500 mg | INHALATION_SOLUTION | Freq: Four times a day (QID) | RESPIRATORY_TRACT | Status: DC
  Administered 2015-06-21 – 2015-06-22 (×4): 1.25 mg via RESPIRATORY_TRACT
  Filled 2015-06-21 (×4): qty 0.5

## 2015-06-21 MED ORDER — LEVALBUTEROL HCL 1.25 MG/0.5ML IN NEBU: 1.2500 mg | INHALATION_SOLUTION | Freq: Four times a day (QID) | RESPIRATORY_TRACT | Status: DC

## 2015-06-21 NOTE — Care Management Note (Addendum)
Case Management Note  Patient Details  Name: Damon Walker MRN: [id number] Date of Birth: 03-03-1946  Subjective/Objective:  Pt admitted for acute on chronic CHF. Pt is from home and is active with [facility] for RN and ST. Pt will need resumption orders once completed.                   Action/Plan: CM will continue to monitor for disposition needs.    Expected Discharge Date:                  Expected Discharge Plan:  [place]  In-House Referral:  NA  Discharge planning Services  CM Consult  Post Acute Care Choice:  Home Health, Resumption of Svcs/PTA Provider Choice offered to:  Patient  DME Arranged:    DME Agency:     HH Arranged:  RN, Speech Therapy [place] Agency:  [facility]  Status of Service:  In process, will continue to follow  Medicare Important Message Given:    Date Medicare IM Given:    Medicare IM give by:    Date Additional Medicare IM Given:    Additional Medicare Important Message give by:     If discussed at [place] of Stay Meetings, dates discussed:    Additional Comments:  Bethena Roys, RN 06/21/2015, 1:16 PM

## 2015-06-21 NOTE — Progress Notes (Signed)
Almyra Deforest, [place] Physician Assistant In Progress Cardiology Progress Notes 06/21/2015 8:34 AM    Expand All Collapse All     Patient Name: Damon Walker Date of Encounter: 06/21/2015  Primary Cardiologist: Dr. Ellyn Hack  Principal Problem:  Acute on chronic systolic heart failure [facility]) Active Problems:  Essential hypertension  Hyperlipidemia with target LDL less than 70  Tobacco abuse  Carotid arterial disease ([place])  Stroke with cerebral ischemia ([place])  Cardiomyopathy, ischemic: EF improved to 35-40% from 20-25% post PCI LAD  Prediabetes    SUBJECTIVE  Denies any CP or SOB.   CURRENT MEDS . [START ON 06/22/2015] antiseptic oral rinse 7 mL Mouth Rinse BID  . aspirin EC 81 mg Oral Daily  . azithromycin 500 mg Intravenous Q24H  . carvedilol 9.375 mg Oral BID WC  . cefTRIAXone (ROCEPHIN) IV 1 g Intravenous Q24H  . clopidogrel 75 mg Oral Daily  . feeding supplement (ENSURE ENLIVE) 237 mL Oral TID BM  . furosemide 40 mg Intravenous BID  . Influenza vac split quadrivalent PF 0.5 mL Intramuscular Tomorrow-1000  . ipratropium 0.5 mg Nebulization QID  . levalbuterol 1.25 mg Nebulization QID  . megestrol 200 mg Oral TID AC  . mirtazapine 15 mg Oral QHS  . pantoprazole 40 mg Oral BID  . potassium chloride 20 mEq Oral Daily  . rosuvastatin 40 mg Oral q1800  . sodium chloride 3 mL Intravenous Q12H    OBJECTIVE  Filed Vitals:   06/21/15 0345 06/21/15 0400 06/21/15 0731 06/21/15 0827  BP: 115/54   117/51  Pulse: 59  63 69  Temp:  98.6 F (37 C)    TempSrc:  Oral    Resp: 16  18   Height:      Weight:  183 lb 4.8 oz (83.144 kg)    SpO2: 96%  97%     Intake/Output Summary (Last 24 hours) at 06/21/15 0834 Last data filed at 06/21/15 0548  Gross per 24 hour  Intake  650 ml  Output  2150 ml  Net  -1500 ml   Filed Weights   06/20/15 1014 06/20/15 1700 06/21/15 0400  Weight: 185 lb 8 oz (84.142 kg) 183 lb 6.8 oz (83.2 kg) 183 lb 4.8 oz (83.144 kg)    PHYSICAL EXAM  General: Pleasant, NAD. Neuro: Alert and oriented X 3. Moves all extremities spontaneously. Psych: Normal affect. HEENT: Normal Neck: Supple without bruits or JVD. Lungs: Resp regular and unlabored, CTA with mildly diminished breath sound near bases Heart: RRR no s3, s4, or murmurs. Abdomen: Soft, non-tender, non-distended, BS + x 4.  Extremities: No clubbing, cyanosis or edema. DP/PT/Radials 2+ and equal bilaterally.  Accessory Clinical Findings  CBC  Recent Labs (last 2 labs)      Recent Labs  06/20/15 0727  WBC 10.7*  NEUTROABS 7.0  HGB 11.4*  HCT 36.3*  MCV 93.6  PLT 302     Basic Metabolic Panel  Recent Labs (last 2 labs)      Recent Labs  06/20/15 0727 06/21/15 0235  NA 140 141  K 4.3 3.7  CL 110 105  CO2 18* 25  GLUCOSE 260* 131*  BUN 9 17  CREATININE 1.46* 1.49*  CALCIUM 9.0 9.3     Liver Function Tests  Recent Labs (last 2 labs)      Recent Labs  06/20/15 0727  AST 29  ALT 24  ALKPHOS 43  BILITOT 1.1  PROT 7.3  ALBUMIN 3.5     Cardiac Enzymes  Recent  Labs (last 2 labs)      Recent Labs  06/20/15 1535 06/20/15 2057 06/21/15 0235  TROPONINI 0.13* 0.17* 0.18*      TELE NSR with occasional PACs    ECG  No new EKG  Echocardiogram 05/15/2015  LV EF: 35% -  40%  ------------------------------------------------------------------- Indications:   CVA 436.  ------------------------------------------------------------------- History:  PMH: Stroke with cerebral ischemia. Pre-syncope. PMH: Myocardial infarction. Risk factors: Current tobacco use. Hypertension. Dyslipidemia.  ------------------------------------------------------------------- Study  Conclusions  - Left ventricle: The cavity size was normal. Wall thickness was increased in a pattern of mild LVH. Systolic function was moderately reduced. The estimated ejection fraction was in the range of 35% to 40%. There is moderate hypokinesis of the mid-apicalanteroseptal myocardium. There is moderate hypokinesis of the anterior and apical myocardium. Doppler parameters are consistent with abnormal left ventricular relaxation (grade 1 diastolic dysfunction). - Left atrium: The atrium was mildly dilated. - Right atrium: The atrium was mildly dilated.    Radiology/Studies   Imaging Results    Dg Abd 1 View  05/23/2015 CLINICAL DATA: Heart attack and stroke EXAM: ABDOMEN - 1 VIEW COMPARISON: None. FINDINGS: No disproportionate dilatation of bowel. No obvious free intraperitoneal gas. Unremarkable bony framework. Calcified granuloma is suspected in the right lobe of the liver. IMPRESSION: Nonobstructive bowel gas pattern. Electronically Signed By: Marybelle Killings M.D. On: 05/23/2015 15:20   Dg Chest Port 1 View  06/20/2015 CLINICAL DATA: Acute onset severe shortness of breath this morning. Smoker with a history of coronary artery disease. Initial encounter. EXAM: PORTABLE CHEST 1 VIEW COMPARISON: Single view of the chest 05/11/2015. FINDINGS: There has been marked worsening of bilateral airspace disease that appears most compatible pulmonary edema. Left pleural effusion is noted. Heart size is mildly enlarged. IMPRESSION: Marked worsening of bilateral airspace disease most consistent with pulmonary edema. Associated left effusion is noted. Electronically Signed By: Inge Rise M.D. On: 06/20/2015 07:41     ASSESSMENT AND PLAN  69 year old Caucasian male with past medical history of hyperlipidemia, prediabetes, hypertension, tobacco abuse, moderate carotid arteries disease and recent complicated course of CAD s/p anterior STEMI followed by cardioembolic  CVA and LGIB presented with acute dyspnea that woke him up from sleep around 4AM on 06/20/2015.   1. Acute respiratory failure likely 2/2 acute on chronic systolic HF although cannot r/o PNA - trop 0.13 --> 0.17 --> 0.18, doubt ACS given lack of CP (he had angina with prior anterior STEMI in 05/2015)  2. Acute on chronic systolic HF with baseline EF 35% - echo 05/13/2015 EF 20-25% with akinesis of mid-apical anterior, basal mid anteroseptal, mid anterolateral, apical septal, apical lateral and apical myocardium - Echo 05/15/2015 EF 35-40%, moderate hypokinesis of mid-apical anteroseptal myocardium, moderate hypokinesis of anterior and apical myocardium, grade 1 diastolic dysfunction - Also received solu-medrol and duoneb. Significant improvement in symptom with 40mg  IV lasix, likely can transition to PO lasix tomorrow. Ambulate today with assistance  3. Lactic acidosis, lactic acid 3.7, cannot r/o PNA - discussed with MD, will prophylactically treat with abx - UA negative, 2nd and 3rd lactic acid went down to 1.9. Afebrile last night. Started on IV Rocephin and azithromycin yesterday after discussing with pharmacist.  - no further sign of sepsis at this time  4. CAD s/p DES x 2 to LAD, 80-90% long residual in RCA - may consider PCI to RCA later, however will hold off and treat HF and possible PNA first  5. L PCA CVA on 05/14/2015 with persistent R visual  field deficit  6. Lower GI bleed after receive TPA for CVA in 05/2015 - polypectomy 2 weeks prior to STEMI in early Oct 2016 - flex sig 05/18/2015 after had LGIB after TPA for CVA, no source of bleeding found, previous polypectomy site stable, small internal hemorrhoid present  7. Hyperlipidemia 8. Prediabetes 9. Hypertension 10. tobacco abuse 11. moderate carotid arteries disease      Signed, Woodward Ku Pager: [phone]              The patient was seen, examined and discussed with Almyra Deforest, PA-C and I agree with the above.   69 year old Caucasian male with past medical history of hyperlipidemia, prediabetes, hypertension, tobacco abuse, moderate carotid arteries disease and recent complicated course of CAD s/p anterior STEMI followed by cardioembolic CVA and LGIB presented with acute dyspnea that woke him up from sleep around 4AM this morning. The patient is fluid overloaded and wheezing on physical exam and complains of SOB no CP, he noticed chills but no fever at home. He has leukocytosis, lactic acid 3.7 and elevated crea. He was started on antibiotics for presumed CAP, and on iv diuretics for acute on chronic systolic CHF with 5 lbs of weight gain.  He has diuresed 3 lbs overnight and feels slightly better. Crea at baseline 1.49. He has mild troponin elevation with flat response, most probably demand ischemia sec to sepsis.  Possible cardiac cath to treat RCA in the future, not right now as he is septic, has normal troponin and acute renal failure.   Dorothy Spark 06/21/2015

## 2015-06-21 NOTE — Progress Notes (Signed)
Patient Name: Damon Walker Date of Encounter: 06/21/2015  Primary Cardiologist: Dr. Ellyn Hack   Principal Problem:   Acute on chronic systolic heart failure [facility]) Active Problems:   Essential hypertension   Hyperlipidemia with target LDL less than 70   Tobacco abuse   Carotid arterial disease ([place])   Stroke with cerebral ischemia ([facility])   Cardiomyopathy, ischemic: EF improved to 35-40% from 20-25% post PCI LAD   Prediabetes    SUBJECTIVE  Denies any CP or SOB.   CURRENT MEDS . [START ON 06/22/2015] antiseptic oral rinse  7 mL Mouth Rinse BID  . aspirin EC  81 mg Oral Daily  . azithromycin  500 mg Intravenous Q24H  . carvedilol  9.375 mg Oral BID WC  . cefTRIAXone (ROCEPHIN)  IV  1 g Intravenous Q24H  . clopidogrel  75 mg Oral Daily  . feeding supplement (ENSURE ENLIVE)  237 mL Oral TID BM  . furosemide  40 mg Intravenous BID  . Influenza vac split quadrivalent PF  0.5 mL Intramuscular Tomorrow-1000  . ipratropium  0.5 mg Nebulization QID  . levalbuterol  1.25 mg Nebulization QID  . megestrol  200 mg Oral TID AC  . mirtazapine  15 mg Oral QHS  . pantoprazole  40 mg Oral BID  . potassium chloride  20 mEq Oral Daily  . rosuvastatin  40 mg Oral q1800  . sodium chloride  3 mL Intravenous Q12H    OBJECTIVE  Filed Vitals:   06/21/15 0345 06/21/15 0400 06/21/15 0731 06/21/15 0827  BP: 115/54   117/51  Pulse: 59  63 69  Temp:  98.6 F (37 C)    TempSrc:  Oral    Resp: 16  18   Height:      Weight:  183 lb 4.8 oz (83.144 kg)    SpO2: 96%  97%     Intake/Output Summary (Last 24 hours) at 06/21/15 0834 Last data filed at 06/21/15 0548  Gross per 24 hour  Intake    650 ml  Output   2150 ml  Net  -1500 ml   Filed Weights   06/20/15 1014 06/20/15 1700 06/21/15 0400  Weight: 185 lb 8 oz (84.142 kg) 183 lb 6.8 oz (83.2 kg) 183 lb 4.8 oz (83.144 kg)    PHYSICAL EXAM  General: Pleasant, NAD. Neuro: Alert and oriented X 3. Moves all extremities  spontaneously. Psych: Normal affect. HEENT:  Normal  Neck: Supple without bruits or JVD. Lungs:  Resp regular and unlabored, CTA with mildly diminished breath sound near bases Heart: RRR no s3, s4, or murmurs. Abdomen: Soft, non-tender, non-distended, BS + x 4.  Extremities: No clubbing, cyanosis or edema. DP/PT/Radials 2+ and equal bilaterally.  Accessory Clinical Findings  CBC  Recent Labs  06/20/15 0727  WBC 10.7*  NEUTROABS 7.0  HGB 11.4*  HCT 36.3*  MCV 93.6  PLT 99991111   Basic Metabolic Panel  Recent Labs  06/20/15 0727 06/21/15 0235  NA 140 141  K 4.3 3.7  CL 110 105  CO2 18* 25  GLUCOSE 260* 131*  BUN 9 17  CREATININE 1.46* 1.49*  CALCIUM 9.0 9.3   Liver Function Tests  Recent Labs  06/20/15 0727  AST 29  ALT 24  ALKPHOS 43  BILITOT 1.1  PROT 7.3  ALBUMIN 3.5   Cardiac Enzymes  Recent Labs  06/20/15 1535 06/20/15 2057 06/21/15 0235  TROPONINI 0.13* 0.17* 0.18*    TELE NSR with occasional PACs    ECG  No new EKG  Echocardiogram 05/15/2015  LV EF: 35% -  40%  ------------------------------------------------------------------- Indications:   CVA 436.  ------------------------------------------------------------------- History:  PMH: Stroke with cerebral ischemia. Pre-syncope. PMH: Myocardial infarction. Risk factors: Current tobacco use. Hypertension. Dyslipidemia.  ------------------------------------------------------------------- Study Conclusions  - Left ventricle: The cavity size was normal. Wall thickness was increased in a pattern of mild LVH. Systolic function was moderately reduced. The estimated ejection fraction was in the range of 35% to 40%. There is moderate hypokinesis of the mid-apicalanteroseptal myocardium. There is moderate hypokinesis of the anterior and apical myocardium. Doppler parameters are consistent with abnormal left ventricular relaxation (grade 1 diastolic dysfunction). -  Left atrium: The atrium was mildly dilated. - Right atrium: The atrium was mildly dilated.    Radiology/Studies  Dg Abd 1 View  05/23/2015  CLINICAL DATA:  Heart attack and stroke EXAM: ABDOMEN - 1 VIEW COMPARISON:  None. FINDINGS: No disproportionate dilatation of bowel. No obvious free intraperitoneal gas. Unremarkable bony framework. Calcified granuloma is suspected in the right lobe of the liver. IMPRESSION: Nonobstructive bowel gas pattern. Electronically Signed   By: Marybelle Killings M.D.   On: 05/23/2015 15:20   Dg Chest Port 1 View  06/20/2015  CLINICAL DATA:  Acute onset severe shortness of breath this morning. Smoker with a history of coronary artery disease. Initial encounter. EXAM: PORTABLE CHEST 1 VIEW COMPARISON:  Single view of the chest 05/11/2015. FINDINGS: There has been marked worsening of bilateral airspace disease that appears most compatible pulmonary edema. Left pleural effusion is noted. Heart size is mildly enlarged. IMPRESSION: Marked worsening of bilateral airspace disease most consistent with pulmonary edema. Associated left effusion is noted. Electronically Signed   By: Inge Rise M.D.   On: 06/20/2015 07:41    ASSESSMENT AND PLAN  69 year old Caucasian male with past medical history of hyperlipidemia, prediabetes, hypertension, tobacco abuse, moderate carotid arteries disease and recent complicated course of CAD s/p anterior STEMI followed by cardioembolic CVA and LGIB presented with acute dyspnea that woke him up from sleep around 4AM on 06/20/2015.   1. Acute respiratory failure likely 2/2 acute on chronic systolic HF although cannot r/o PNA - trop 0.13 --> 0.17 --> 0.18, doubt ACS given lack of CP (he had angina with prior anterior STEMI in 05/2015)  2. Acute on chronic systolic HF with baseline EF 35% - echo 05/13/2015 EF 20-25% with akinesis of mid-apical anterior, basal mid anteroseptal, mid anterolateral, apical septal,  apical lateral and apical myocardium - Echo 05/15/2015 EF 35-40%, moderate hypokinesis of mid-apical anteroseptal myocardium, moderate hypokinesis of anterior and apical myocardium, grade 1 diastolic dysfunction - Also received solu-medrol and duoneb. Significant improvement in symptom with 40mg  IV lasix, likely can transition to PO lasix tomorrow. Ambulate today with assistance  3. Lactic acidosis, lactic acid 3.7, cannot r/o PNA - discussed with MD, will prophylactically treat with abx - UA negative, 2nd and 3rd lactic acid went down to 1.9. Afebrile last night. Started on IV Rocephin and azithromycin yesterday after discussing with pharmacist.   - no further sign of sepsis at this time  4. CAD s/p DES x 2 to LAD, 80-90% long residual in RCA - may consider PCI to RCA later, however will hold off and treat HF and possible PNA first  5. L PCA CVA on 05/14/2015 with persistent R visual field deficit  6. Lower GI bleed after receive TPA for CVA in 05/2015 - polypectomy 2 weeks prior to STEMI in early Oct 2016 - flex sig  05/18/2015 after had LGIB after TPA for CVA, no source of bleeding found, previous polypectomy site stable, small internal hemorrhoid present  7. Hyperlipidemia 8. Prediabetes 9. Hypertension 10. tobacco abuse 11. moderate carotid arteries disease   Signed, Woodward Ku Pager: [phone]  The patient was seen, examined and discussed with Almyra Deforest, PA-C and I agree with the above.   69 year old Caucasian male with past medical history of hyperlipidemia, prediabetes, hypertension, tobacco abuse, moderate carotid arteries disease and recent complicated course of CAD s/p anterior STEMI followed by cardioembolic CVA and LGIB presented with acute dyspnea that woke him up from sleep around 4AM this morning. The patient is fluid overloaded and wheezing on physical exam and complains of  SOB no CP, he noticed chills but no fever at home. He has leukocytosis, lactic acid 3.7 and elevated crea. We will treat aggressively for presumed CAP, acute on chronic systolic CHF. Possible cardiac cath to treat RCA in the future, not right now as he is septic, has normal troponin and acute renal failure.   Dorothy Spark 06/21/2015

## 2015-06-21 NOTE — Clinical Documentation Improvement (Addendum)
Cardiology  (please document your query response in the progress notes and discharge summary, not on the BPA itself.)  "Sepsis" is documented in the H&P.  To assist with accurate code assignment, please document if the diagnosis of Sepsis has been:  - Ruled Out and is not applicable to this admission  - Confirmed and is applicable to this admission  Clinical Information: "Possible cardiac cath to treat RCA in the future, not right now as he is septic, has normal troponin and acute renal failure." is documented in the H&P by Dr. Meda Coffee. WBC on admission 10.7 Lactic Acid trend this admission - 3.71, 1.9, 1.9 Temp trend this admission - 97.3 axillary, 98 oral, 98.4 oral, 98.6 oral, 98.5 oral, 98.4 oral, 98.6 oral     Please exercise your independent, professional judgment when responding. A specific answer is not anticipated or expected.   Thank You,  Erling Conte  RN BSN CCDS [phone] Health Information Management [place]

## 2015-06-21 NOTE — Progress Notes (Signed)
UR Completed Kenlyn Lose Graves-Bigelow, RN,BSN [phone]  

## 2015-06-22 DIAGNOSIS — A419 Sepsis, unspecified organism: Secondary | ICD-10-CM | POA: Diagnosis not present

## 2015-06-22 LAB — BASIC METABOLIC PANEL
ANION GAP: 11 (ref 5–15)
BUN: 25 mg/dL — ABNORMAL HIGH (ref 6–20)
CALCIUM: 9.4 mg/dL (ref 8.9–10.3)
CO2: 26 mmol/L (ref 22–32)
Chloride: 102 mmol/L (ref 101–111)
Creatinine, Ser: 1.31 mg/dL — ABNORMAL HIGH (ref 0.61–1.24)
GFR calc Af Amer: >60 mL/min (ref >60)
GFR, EST NON AFRICAN AMERICAN: 53 mL/min — AB (ref >60)
GLUCOSE: 99 mg/dL (ref 65–99)
Potassium: 3.5 mmol/L (ref 3.5–5.1)
SODIUM: 139 mmol/L (ref 135–145)

## 2015-06-22 MED ORDER — LEVALBUTEROL HCL 1.25 MG/0.5ML IN NEBU
1.2500 mg | INHALATION_SOLUTION | Freq: Two times a day (BID) | RESPIRATORY_TRACT | Status: DC
  Administered 2015-06-23: 1.25 mg via RESPIRATORY_TRACT
  Filled 2015-06-22 (×2): qty 0.5

## 2015-06-22 MED ORDER — AZITHROMYCIN 250 MG PO TABS
500.0000 mg | ORAL_TABLET | Freq: Every day | ORAL | Status: DC
  Administered 2015-06-22 – 2015-06-23 (×2): 500 mg via ORAL
  Filled 2015-06-22 (×2): qty 2

## 2015-06-22 MED ORDER — IPRATROPIUM BROMIDE 0.02 % IN SOLN
0.5000 mg | Freq: Two times a day (BID) | RESPIRATORY_TRACT | Status: DC
  Administered 2015-06-23: 0.5 mg via RESPIRATORY_TRACT
  Filled 2015-06-22 (×2): qty 2.5

## 2015-06-22 NOTE — Progress Notes (Signed)
Patient Name: Damon Walker Date of Encounter: 06/22/2015  Principal Problem:   Acute on chronic systolic heart failure ([place]) Active Problems:   Essential hypertension   Hyperlipidemia with target LDL less than 70   Tobacco abuse   Carotid arterial disease ([place])   Stroke with cerebral ischemia ([facility])   Cardiomyopathy, ischemic: EF improved to 35-40% from 20-25% post PCI LAD   Prediabetes   Length of Stay: 2  SUBJECTIVE  Continues to feel SOB, but its significantly better, requires 5 L O2 via [place].   CURRENT MEDS . antiseptic oral rinse  7 mL Mouth Rinse BID  . aspirin EC  81 mg Oral Daily  . azithromycin  500 mg Intravenous Q24H  . carvedilol  9.375 mg Oral BID WC  . cefTRIAXone (ROCEPHIN)  IV  1 g Intravenous Q24H  . clopidogrel  75 mg Oral Daily  . feeding supplement (ENSURE ENLIVE)  237 mL Oral TID BM  . furosemide  40 mg Intravenous BID  . Influenza vac split quadrivalent PF  0.5 mL Intramuscular Tomorrow-1000  . ipratropium  0.5 mg Nebulization BID  . levalbuterol  1.25 mg Nebulization BID  . megestrol  200 mg Oral TID AC  . mirtazapine  15 mg Oral QHS  . pantoprazole  40 mg Oral BID  . potassium chloride  20 mEq Oral Daily  . rosuvastatin  40 mg Oral q1800  . sodium chloride  3 mL Intravenous Q12H   OBJECTIVE  Filed Vitals:   06/22/15 0802 06/22/15 0804 06/22/15 0835 06/22/15 0928  BP:    129/58  Pulse: 68 66  71  Temp:      TempSrc:      Resp: 18   19  Height:      Weight:      SpO2: 96%  94% 97%    Intake/Output Summary (Last 24 hours) at 06/22/15 0929 Last data filed at 06/22/15 0925  Gross per 24 hour  Intake    830 ml  Output   2725 ml  Net  -1895 ml   Filed Weights   06/20/15 1700 06/21/15 0400 06/22/15 0400  Weight: 183 lb 6.8 oz (83.2 kg) 183 lb 4.8 oz (83.144 kg) 180 lb 9.6 oz (81.92 kg)   PHYSICAL EXAM  General: Pleasant, NAD. Neuro: Alert and oriented X 3. Moves all extremities spontaneously. Psych: Normal affect. HEENT:   Normal  Neck: Supple without bruits or JVD. Lungs:  Resp regular and unlabored, CTA. Heart: RRR no s3, s4, or murmurs. Abdomen: Soft, non-tender, non-distended, BS + x 4.  Extremities: No clubbing, cyanosis or edema. DP/PT/Radials 2+ and equal bilaterally.  Accessory Clinical Findings  CBC  Recent Labs  06/20/15 0727  WBC 10.7*  NEUTROABS 7.0  HGB 11.4*  HCT 36.3*  MCV 93.6  PLT 99991111   Basic Metabolic Panel  Recent Labs  06/21/15 0235 06/22/15 0542  NA 141 139  K 3.7 3.5  CL 105 102  CO2 25 26  GLUCOSE 131* 99  BUN 17 25*  CREATININE 1.49* 1.31*  CALCIUM 9.3 9.4   Liver Function Tests  Recent Labs  06/20/15 0727  AST 29  ALT 24  ALKPHOS 43  BILITOT 1.1  PROT 7.3  ALBUMIN 3.5   No results for input(s): LIPASE, AMYLASE in the last 72 hours. Cardiac Enzymes  Recent Labs  06/20/15 1535 06/20/15 2057 06/21/15 0235  TROPONINI 0.13* 0.17* 0.18*   Radiology/Studies  Dg Abd 1 View  05/23/2015  CLINICAL DATA:  Heart attack and stroke EXAM: ABDOMEN - 1 VIEW COMPARISON:  None. FINDINGS: No disproportionate dilatation of bowel. No obvious free intraperitoneal gas. Unremarkable bony framework. Calcified granuloma is suspected in the right lobe of the liver. IMPRESSION: Nonobstructive bowel gas pattern. Electronically Signed   By: Marybelle Killings M.D.   On: 05/23/2015 15:20   Dg Chest Port 1 View  06/20/2015  CLINICAL DATA:  Acute onset severe shortness of breath this morning. Smoker with a history of coronary artery disease. Initial encounter. EXAM: PORTABLE CHEST 1 VIEW COMPARISON:  Single view of the chest 05/11/2015. FINDINGS: There has been marked worsening of bilateral airspace disease that appears most compatible pulmonary edema. Left pleural effusion is noted. Heart size is mildly enlarged. IMPRESSION: Marked worsening of bilateral airspace disease most consistent with pulmonary edema. Associated left effusion is noted. Electronically Signed   By: Inge Rise M.D.   On: 06/20/2015 07:41   TELE: SR, PVCs    ASSESSMENT AND PLAN   69 year old Caucasian male with past medical history of hyperlipidemia, prediabetes, hypertension, tobacco abuse, moderate carotid arteries disease and recent complicated course of CAD s/p anterior STEMI followed by cardioembolic CVA and LGIB presented with acute dyspnea that woke him up from sleep around 4AM on 06/20/2015.   1. Acute respiratory failure likely 2/2 acute on chronic systolic HF although cannot r/o PNA - trop 0.13 --> 0.17 --> 0.18, doubt ACS given lack of CP (he had angina with prior anterior STEMI in 05/2015)  2. Acute on chronic systolic HF with baseline EF 35% - echo 05/13/2015 EF 20-25% with akinesis of mid-apical anterior, basal mid anteroseptal, mid anterolateral, apical septal, apical lateral and apical myocardium - Echo 05/15/2015 EF 35-40%, moderate hypokinesis of mid-apical anteroseptal myocardium, moderate hypokinesis of anterior and apical myocardium, grade 1 diastolic dysfunction - Also received solu-medrol and duoneb. Significant improvement in symptom with 40mg  IV lasix, likely can transition to PO lasix tomorrow. Ambulate today with assistance  - diuresed 3 liters total since the admission, he is much improved, but continues to feel SOB and requires 5 L of O2, O2 sats 91-95%, I will continue with iv Lasix for now, Crea is improving  3. Lactic acidosis, lactic acid 3.7, cannot r/o PNA - discussed with MD, will prophylactically treat with abx - UA negative, 2nd and 3rd lactic acid went down to 1.9. Afebrile last night. Started on IV Rocephin and azithromycin yesterday after discussing with pharmacist.  - no further sign of sepsis at this time  4. CAD s/p DES x 2 to LAD, 80-90% long residual in RCA - may consider PCI to RCA later, however will hold off and treat HF and possible  PNA first  5. L PCA CVA on 05/14/2015 with persistent R visual field deficit  6. Lower GI bleed after receive TPA for CVA in 05/2015 - polypectomy 2 weeks prior to STEMI in early Oct 2016 - flex sig 05/18/2015 after had LGIB after TPA for CVA, no source of bleeding found, previous polypectomy site stable, small internal hemorrhoid present  7. Hyperlipidemia 8. Prediabetes 9. Hypertension 10. tobacco abuse 11. moderate carotid arteries disease    Signed, Dorothy Spark MD, [facility] 06/22/2015

## 2015-06-23 DIAGNOSIS — H53461 Homonymous bilateral field defects, right side: Secondary | ICD-10-CM | POA: Diagnosis not present

## 2015-06-23 DIAGNOSIS — I69398 Other sequelae of cerebral infarction: Secondary | ICD-10-CM | POA: Diagnosis not present

## 2015-06-23 DIAGNOSIS — I69318 Other symptoms and signs involving cognitive functions following cerebral infarction: Secondary | ICD-10-CM | POA: Diagnosis not present

## 2015-06-23 DIAGNOSIS — J9601 Acute respiratory failure with hypoxia: Secondary | ICD-10-CM

## 2015-06-23 DIAGNOSIS — I10 Essential (primary) hypertension: Secondary | ICD-10-CM | POA: Diagnosis not present

## 2015-06-23 LAB — BASIC METABOLIC PANEL
Anion gap: 9 (ref 5–15)
BUN: 25 mg/dL — AB (ref 6–20)
CALCIUM: 9.8 mg/dL (ref 8.9–10.3)
CO2: 32 mmol/L (ref 22–32)
CREATININE: 1.39 mg/dL — AB (ref 0.61–1.24)
Chloride: 99 mmol/L — ABNORMAL LOW (ref 101–111)
GFR calc Af Amer: 57 mL/min — ABNORMAL LOW (ref >60)
GFR, EST NON AFRICAN AMERICAN: 49 mL/min — AB (ref >60)
GLUCOSE: 107 mg/dL — AB (ref 65–99)
Potassium: 3.4 mmol/L — ABNORMAL LOW (ref 3.5–5.1)
Sodium: 140 mmol/L (ref 135–145)

## 2015-06-23 MED ORDER — MOXIFLOXACIN HCL 400 MG PO TABS: 400.0000 mg | ORAL_TABLET | Freq: Every day | ORAL | Status: DC

## 2015-06-23 MED ORDER — FUROSEMIDE 40 MG PO TABS
40.0000 mg | ORAL_TABLET | Freq: Every day | ORAL | Status: DC
Start: 2015-06-23 — End: 2015-06-23
  Administered 2015-06-23: 40 mg via ORAL
  Filled 2015-06-23: qty 1

## 2015-06-23 MED ORDER — POTASSIUM CHLORIDE CRYS ER 20 MEQ PO TBCR: 20.0000 meq | EXTENDED_RELEASE_TABLET | Freq: Every day | ORAL | Status: DC

## 2015-06-23 MED ORDER — FUROSEMIDE 40 MG PO TABS: 40.0000 mg | ORAL_TABLET | Freq: Every day | ORAL | Status: DC

## 2015-06-23 MED ORDER — POTASSIUM CHLORIDE CRYS ER 20 MEQ PO TBCR
40.0000 meq | EXTENDED_RELEASE_TABLET | Freq: Once | ORAL | Status: AC
  Administered 2015-06-23: 40 meq via ORAL
  Filled 2015-06-23: qty 2

## 2015-06-23 NOTE — Progress Notes (Signed)
Pt ambulated to RN station and back (about 250 feet).  O2 sats remained 95-98%.  Pt has not been on oxygen since this am (0700).  No complaints.  Wife at bedside.  Isaac Laud, [place] notified via text page.  Potential d/c home today per Dr. Meda Coffee.

## 2015-06-23 NOTE — Discharge Instructions (Signed)
Heart Failure prevention plan:  1. Avoid salt 2. Limit daily fluid intake to <2 Liter 3. Contact cardiology if weight increase by more than 3 lbs overnight or 5 lbs in a single week.

## 2015-06-23 NOTE — Care Management Note (Addendum)
Case Management Note Previous CM note initiated by Jacqlyn Krauss RN CM  Patient Details  Name: Damon Walker MRN: [id number] Date of Birth: Dec 15, 1945  Subjective/Objective:  Pt admitted for acute on chronic CHF. Pt is from home and is active with [facility] for RN and ST. Pt will need resumption orders once completed.                   Action/Plan: CM will continue to monitor for disposition needs.    Expected Discharge Date:                  Expected Discharge Plan:  [place]  In-House Referral:  NA  Discharge planning Services  CM Consult  Post Acute Care Choice:  Home Health, Resumption of Svcs/PTA Provider Choice offered to:  Patient  DME Arranged:    DME Agency:     HH Arranged:  RN, [facility] [place] Agency:  [place]  Status of Service:  In process, will continue to follow  Medicare Important Message Given:  Yes Date Medicare IM Given:    Medicare IM give by:    Date Additional Medicare IM Given:    Additional Medicare Important Message give by:     If discussed at [place] of Stay Meetings, dates discussed:    Additional Comments:  06/23/15- 1630- Marvetta Gibbons RN, BSN- noted that pt had d/c orders- paged PA- for resumption order for [facility]- also noted that pt will not be going home on iv abx- will be doing po abx instead. Bedside RN -also aware that resumption orders needed. Spoke with Butch Penny at [facility] regarding pt's discharge.   06/23/15- Marvetta Gibbons RN, BSN - noticed that pt will need IV abx until 11/22- ?need for IV abx if pt goes home over weekend- have spoken with Pam at [facility] regarding possible need for IV Rocephin - pt already active with [facility] for HH-RN/ST- will need resumption orders written and if IV abx need prescription for HH abx. - [facility] will follow over weekend for potential d/c-   Dahlia Client, Romeo Rabon, RN 06/23/2015, 11:57 AM

## 2015-06-23 NOTE — Progress Notes (Signed)
Utilization review completed.  

## 2015-06-23 NOTE — Progress Notes (Signed)
Patient Name: Damon Walker Date of Encounter: 06/23/2015  Primary Cardiologist: Dr. Ellyn Hack  Principal Problem:   Acute on chronic systolic heart failure [facility]) Active Problems:   Essential hypertension   Hyperlipidemia with target LDL less than 70   Tobacco abuse   Carotid arterial disease ([place])   Stroke with cerebral ischemia (HCC)   Cardiomyopathy, ischemic: EF improved to 35-40% from 20-25% post PCI LAD   Prediabetes   Length of Stay: 3  SUBJECTIVE  Feels like breathing in back to baseline.    CURRENT MEDS . antiseptic oral rinse  7 mL Mouth Rinse BID  . aspirin EC  81 mg Oral Daily  . azithromycin  500 mg Oral Daily  . carvedilol  9.375 mg Oral BID WC  . cefTRIAXone (ROCEPHIN)  IV  1 g Intravenous Q24H  . clopidogrel  75 mg Oral Daily  . feeding supplement (ENSURE ENLIVE)  237 mL Oral TID BM  . furosemide  40 mg Intravenous BID  . ipratropium  0.5 mg Nebulization BID  . levalbuterol  1.25 mg Nebulization BID  . mirtazapine  15 mg Oral QHS  . pantoprazole  40 mg Oral BID  . potassium chloride  20 mEq Oral Daily  . rosuvastatin  40 mg Oral q1800  . sodium chloride  3 mL Intravenous Q12H   OBJECTIVE  Filed Vitals:   06/22/15 1757 06/22/15 2034 06/23/15 0041 06/23/15 0441  BP: 114/47 106/44 101/48 118/50  Pulse: 75 69 59 65  Temp:  98 F (36.7 C) 97.9 F (36.6 C) 98.6 F (37 C)  TempSrc:  Oral Axillary Oral  Resp: 20 20 20 25   Height:      Weight:    175 lb 8 oz (79.606 kg)  SpO2: 96% 96% 98% 98%    Intake/Output Summary (Last 24 hours) at 06/23/15 0705 Last data filed at 06/23/15 0445  Gross per 24 hour  Intake    570 ml  Output   2250 ml  Net  -1680 ml   Filed Weights   06/21/15 0400 06/22/15 0400 06/23/15 0441  Weight: 183 lb 4.8 oz (83.144 kg) 180 lb 9.6 oz (81.92 kg) 175 lb 8 oz (79.606 kg)   PHYSICAL EXAM  General: Pleasant, NAD. Neuro: Alert and oriented X 3. Moves all extremities spontaneously. Psych: Normal affect. HEENT:   Normal  Neck: Supple without bruits or JVD. Lungs:  Resp regular and unlabored, CTA. Heart: RRR no s3, s4, or murmurs. Abdomen: Soft, non-tender, non-distended, BS + x 4.  Extremities: No clubbing, cyanosis or edema. DP/PT/Radials 2+ and equal bilaterally.  Accessory Clinical Findings  CBC  Recent Labs  06/20/15 0727  WBC 10.7*  NEUTROABS 7.0  HGB 11.4*  HCT 36.3*  MCV 93.6  PLT 99991111   Basic Metabolic Panel  Recent Labs  06/21/15 0235 06/22/15 0542  NA 141 139  K 3.7 3.5  CL 105 102  CO2 25 26  GLUCOSE 131* 99  BUN 17 25*  CREATININE 1.49* 1.31*  CALCIUM 9.3 9.4   Liver Function Tests  Recent Labs  06/20/15 0727  AST 29  ALT 24  ALKPHOS 43  BILITOT 1.1  PROT 7.3  ALBUMIN 3.5   Cardiac Enzymes  Recent Labs  06/20/15 1535 06/20/15 2057 06/21/15 0235  TROPONINI 0.13* 0.17* 0.18*   Radiology/Studies  Dg Chest Port 1 View  06/20/2015  CLINICAL DATA:  Acute onset severe shortness of breath this morning. Smoker with a history of coronary artery disease. Initial  encounter. EXAM: PORTABLE CHEST 1 VIEW COMPARISON:  Single view of the chest 05/11/2015. FINDINGS: There has been marked worsening of bilateral airspace disease that appears most compatible pulmonary edema. Left pleural effusion is noted. Heart size is mildly enlarged. IMPRESSION: Marked worsening of bilateral airspace disease most consistent with pulmonary edema. Associated left effusion is noted. Electronically Signed   By: Inge Rise M.D.   On: 06/20/2015 07:41   TELE: SR, PVCs with bigeminy   ASSESSMENT AND PLAN   69 year old Caucasian male with past medical history of hyperlipidemia, prediabetes, hypertension, tobacco abuse, moderate carotid arteries disease and recent complicated course of CAD s/p anterior STEMI followed by cardioembolic CVA and LGIB presented with acute dyspnea that woke him up from sleep around 4AM on 06/20/2015.   1. Acute respiratory failure likely 2/2 acute on  chronic systolic HF although cannot r/o PNA - trop 0.13 --> 0.17 --> 0.18, doubt ACS given lack of CP (he had angina with prior anterior STEMI in 05/2015)  2. Acute on chronic systolic HF with baseline EF 35% - echo 05/13/2015 EF 20-25% with akinesis of mid-apical anterior, basal mid anteroseptal, mid anterolateral, apical septal, apical lateral and apical myocardium - Echo 05/15/2015 EF 35-40%, moderate hypokinesis of mid-apical anteroseptal myocardium, moderate hypokinesis of anterior and apical myocardium, grade 1 diastolic dysfunction - Also received solu-medrol and duoneb.   - diuresing very well, likely close to baseline, weight down from 185 lbs to 185 lbs. I/O -5L. Asked nursing staff to wean him off [place] and check ambulatory O2 sat. Cr improved with diuresis, BUN trended up from 17 to 25. Appears to be euvolemic on exam. Will change to PO lasix. Additional dose of KCl today given K 3.5    3. Lactic acidosis, lactic acid 3.7, cannot r/o PNA - discussed with MD, will prophylactically treat with abx - UA negative, 2nd and 3rd lactic acid went down to 1.9. Afebrile last night. Started on IV Rocephin and azithromycin yesterday after discussing with pharmacist.  - no further sign of sepsis at this time. On day 4 of IV antibiotic rocephin, likely discharge on abx to finish 10 day course.  4. CAD s/p DES x 2 to LAD, 80-90% long residual in RCA - may consider PCI to RCA later, however will hold off and treat HF and possible PNA first  5. L PCA CVA on 05/14/2015 with persistent R visual field deficit  6. Lower GI bleed after receive TPA for CVA in 05/2015 - polypectomy 2 weeks prior to STEMI in early Oct 2016 - flex sig 05/18/2015 after had LGIB after TPA for CVA, no source of bleeding found, previous polypectomy site stable, small internal hemorrhoid  present  7. Hyperlipidemia 8. Prediabetes 9. Hypertension 10. tobacco abuse 11. moderate carotid arteries disease   Signed, Almyra Deforest MD, [facility] 06/23/2015  The patient was seen, examined and discussed with Almyra Deforest, PA-C and I agree with the above.   69 year old Caucasian male with past medical history of hyperlipidemia, prediabetes, hypertension, tobacco abuse, moderate carotid arteries disease and recent complicated course of CAD s/p anterior STEMI followed by cardioembolic CVA and LGIB presented with CAP and acute CHF.  He was treated with iv antibiotics and diuretics and responded well. He is euvolemic on physical exam, feeling much better, holding O2 Sats at 95 while walking. He has lost 5 excess lbs. We will switch iv ATB to moxifloxacin 400 mg po daily x 1 week. We will start lasix 40 mg po daily, follow up  in our clinic in 1 -2 weeks.  He has mild troponin elevation with flat response, most probably demand ischemia sec to sepsis.  Possible cardiac cath to treat RCA in the future if he is symptomatic, not right now as he is septic, has normal troponin and acute renal failure.   Dorothy Spark 06/23/2015

## 2015-06-23 NOTE — Care Management Important Message (Signed)
Important Message  Patient Details  Name: Damon Walker MRN: [id number] Date of Birth: 12-May-1946   Medicare Important Message Given:  Yes    Nathen May 06/23/2015, 11:47 AM

## 2015-06-23 NOTE — Discharge Summary (Signed)
Discharge Summary   Patient ID: Damon Walker,  MRN: [id number], DOB/AGE: 1946-04-12 69 y.o.  Admit date: 06/20/2015 Discharge date: 06/23/2015  Primary Care Provider: [name] Primary Cardiologist: Dr. Ellyn Hack  Discharge Diagnoses Principal Problem:   Acute on chronic systolic heart failure [facility]) Active Problems:   Essential hypertension   Hyperlipidemia with target LDL less than 70   Tobacco abuse   Carotid arterial disease ([place])   Stroke with cerebral ischemia (HCC)   Cardiomyopathy, ischemic: EF improved to 35-40% from 20-25% post PCI LAD   Prediabetes   Allergies Allergies  Allergen Reactions  . Latex Itching and Rash    Hospital Course  The patient is a 69 year old Caucasian male with past medical history of hypertension, prediabetes, hyperlipidemia, tobacco abuse, moderate carotid artery disease and recent complicated course of CAD status post anterior STEMI followed by cardioembolic CVA and lower GI bleed. He was originally seen in consultation in November 2015 for atypical chest pain, Myoview suggestive of prior inferior MI, low risk due to no ischemia. He was seen in May 2016 with exertional chest tightness. The plan was evacuate with GXT, however this did not occur. He came to the hospital on 05/11/2015 with anterior STEMI, cardiac catheterization at that time showed three-vessel CAD with 90% proximal RCA, 80% mid RCA, 80% OM3, 100% prox LAD occlusion treated with DESx2. Echocardiogram at the time showed EF 20-25% with akinesis of mid-apical anterior, basal mid anteroseptal, mid anterolateral, apical septal, apical lateral and apical myocardium. As for his residual RCA is disease, it was medical managed with plan of PCI if has recurrent symptoms. He was discharged on 05/14/2015. Unfortunately 2 hours after discharge, he had acute onset of confusion and decreased vision in the right visual field concerning for L PCA CVA. CT scan did not show any cranial abnormality. He  was treated in the ED with TPA, unfortunately he developed acute lower GI bleed with dropping hemoglobin requiring transfusion with FFP. Follow-up MRI obtained on 05/15/2015 showed acute infarct in the left occipital lobe with chronic microvascular ischemic changes. He continued to have residual right visual field deficit since then. Echocardiogram obtained on 05/15/2015 showed EF 35-40%, moderate hypokinesis of mid-apical anteroseptal myocardium, moderate hypokinesis of anterior and apical myocardium, grade 1 diastolic dysfunction. His Brilinta was switched to Plavix. He was later discharged to inpatient rehabilitation and was released from rehabilitation on 10/21. He came in on 06/20/2015 with acute respiratory failure when he woke up feeling short of breath around 4 AM on the day of arrival. He also endorsed significant orthopnea and paroxysmal nocturnal dyspnea. Initially, his lactic acid was elevated at 3.8, subsequent repeat lactic acid went down to 1.9. He was prophylactically treated with IV antibiotic to cover to atypical PNA.  He was admitted for IV diuresis. He had a significant urinary output with IV Lasix. He was seen in the morning of 06/16/2015 at which time his weight has decreased from 185 pounds to 175 pounds which will serve as his dry weight. He appears to be euvolemic. He has diuresed a total of 5.3 L. He ambulated with nursing staff, his O2 saturation maintained at 95-98% with ambulation. He is deemed stable for discharge from cardiology perspective. I have discussed with MD, will given him 7 day course of Avelox 400mg  daily. His SBP is borderline at 100s, once his BP improve, we can potentially add ACEI/ARB. Heart failure instruction has been given.     Discharge Vitals Blood pressure 104/46, pulse 70, temperature 98.6 F (37  C), temperature source Oral, resp. rate 20, height 5\' 6"  (1.676 m), weight 175 lb 8 oz (79.606 kg), SpO2 99 %.  Filed Weights   06/21/15 0400 06/22/15 0400  06/23/15 0441  Weight: 183 lb 4.8 oz (83.144 kg) 180 lb 9.6 oz (81.92 kg) 175 lb 8 oz (79.606 kg)    Labs  Basic Metabolic Panel  Recent Labs  06/22/15 0542 06/23/15 0620  NA 139 140  K 3.5 3.4*  CL 102 99*  CO2 26 32  GLUCOSE 99 107*  BUN 25* 25*  CREATININE 1.31* 1.39*  CALCIUM 9.4 9.8   Cardiac Enzymes  Recent Labs  06/20/15 2057 06/21/15 0235  TROPONINI 0.17* 0.18*    Disposition  Pt is being discharged home today in good condition.  Follow-up Plans & Appointments      Follow-up Information    Follow up with HAGER, BRYAN, PA-C On 07/04/2015.   Specialties:  Physician Assistant, Radiology, Interventional Cardiology   Why:  3:00pm. Cardiology followup   Contact information:   [address] [place] [address] [phone]       Follow up with [facility] Office On 06/28/2015.   Specialty:  Cardiology   Why:  Obtain BMET lab at [facility] clinic to check kidney function   Contact information:   [address], [place] [phone]      Follow up with Leonie Man, MD On 08/23/2015.   Specialty:  Cardiology   Why:  11:00am   Contact information:   [place]  [address] [phone]       Discharge Medications    Medication List    STOP taking these medications        megestrol 400 MG/10ML suspension  Commonly known as:  MEGACE      TAKE these medications        aspirin EC 81 MG tablet  Take 1 tablet (81 mg total) by mouth daily.     carvedilol 3.125 MG tablet  Commonly known as:  COREG  Take 3 tablets (9.375 mg total) by mouth 2 (two) times daily with a meal.     clopidogrel 75 MG tablet  Commonly known as:  PLAVIX  Take 1 tablet (75 mg total) by mouth daily.     feeding supplement (ENSURE ENLIVE) Liqd  Take 237 mLs by mouth 3 (three) times daily between meals.     ferrous sulfate 325 (65 FE) MG tablet  Take 325 mg by mouth  daily with breakfast.     furosemide 40 MG tablet  Commonly known as:  LASIX  Take 1 tablet (40 mg total) by mouth daily.     mirtazapine 15 MG disintegrating tablet  Commonly known as:  REMERON SOL-TAB  Take 1 tablet (15 mg total) by mouth at bedtime.     moxifloxacin 400 MG tablet  Commonly known as:  AVELOX  Take 1 tablet (400 mg total) by mouth daily at 8 pm.     nitroGLYCERIN 0.4 MG SL tablet  Commonly known as:  NITROSTAT  Place 1 tablet (0.4 mg total) under the tongue every 5 (five) minutes x 3 doses as needed for chest pain.     pantoprazole 40 MG tablet  Commonly known as:  PROTONIX  Take 40 mg by mouth daily.     potassium chloride SA 20 MEQ tablet  Commonly known as:  K-DUR,KLOR-CON  Take 1 tablet (20 mEq total) by  mouth daily.     rosuvastatin 40 MG tablet  Commonly known as:  CRESTOR  Take 1 tablet (40 mg total) by mouth daily at 6 PM.        Outstanding Labs/Studies  BMET on 06/28/2015  Duration of Discharge Encounter   Greater than 30 minutes including physician time.  Hilbert Corrigan PA-C Pager: [phone] 06/23/2015, 4:07 PM

## 2015-06-27 ENCOUNTER — Ambulatory Visit: Payer: Medicare Other | Admitting: Physician Assistant

## 2015-06-27 ENCOUNTER — Other Ambulatory Visit (INDEPENDENT_AMBULATORY_CARE_PROVIDER_SITE_OTHER): Payer: Medicare Other | Admitting: *Deleted

## 2015-06-27 DIAGNOSIS — I10 Essential (primary) hypertension: Secondary | ICD-10-CM | POA: Diagnosis not present

## 2015-06-27 LAB — BASIC METABOLIC PANEL
BUN: 24 mg/dL (ref 7–25)
CALCIUM: 10 mg/dL (ref 8.6–10.3)
CHLORIDE: 101 mmol/L (ref 98–110)
CO2: 26 mmol/L (ref 20–31)
CREATININE: 1.43 mg/dL — AB (ref 0.70–1.18)
Glucose, Bld: 88 mg/dL (ref 65–99)
Potassium: 4.4 mmol/L (ref 3.5–5.3)
Sodium: 139 mmol/L (ref 135–146)

## 2015-06-27 NOTE — Addendum Note (Signed)
Addended by: Eulis Foster on: 06/27/2015 12:23 PM   Modules accepted: Orders

## 2015-06-27 NOTE — Addendum Note (Signed)
Addended by: Eulis Foster on: 06/27/2015 12:24 PM   Modules accepted: Orders

## 2015-07-04 ENCOUNTER — Inpatient Hospital Stay: Payer: Medicare Other | Admitting: Physical Medicine & Rehabilitation

## 2015-07-04 ENCOUNTER — Ambulatory Visit (INDEPENDENT_AMBULATORY_CARE_PROVIDER_SITE_OTHER): Payer: Medicare Other | Admitting: Physician Assistant

## 2015-07-04 ENCOUNTER — Encounter: Payer: Self-pay | Admitting: Physician Assistant

## 2015-07-04 VITALS — BP 134/58 | HR 60 | Ht 66.0 in | Wt 184.3 lb

## 2015-07-04 DIAGNOSIS — Z720 Tobacco use: Secondary | ICD-10-CM | POA: Diagnosis not present

## 2015-07-04 DIAGNOSIS — I255 Ischemic cardiomyopathy: Secondary | ICD-10-CM

## 2015-07-04 DIAGNOSIS — E785 Hyperlipidemia, unspecified: Secondary | ICD-10-CM

## 2015-07-04 DIAGNOSIS — I10 Essential (primary) hypertension: Secondary | ICD-10-CM

## 2015-07-04 DIAGNOSIS — I5042 Chronic combined systolic (congestive) and diastolic (congestive) heart failure: Secondary | ICD-10-CM

## 2015-07-04 NOTE — Progress Notes (Signed)
Patient ID: Damon Walker, male   DOB: [birth year], 69 y.o.   MRN: [id number]    Date:  07/04/2015   ID:  Damon Walker, DOB 03-02-1946, MRN [id number]  PCP:  [name], MD  Primary Cardiologist:  Ellyn Hack  Chief complaint: Post hospital follow-up   History of Present Illness:        The patient is a 69 year old Caucasian male with past medical history of hypertension, prediabetes, hyperlipidemia, tobacco abuse, moderate carotid artery disease and recent complicated course of CAD status post anterior STEMI followed by cardioembolic CVA and lower GI bleed. He was originally seen in consultation in November 2015 for atypical chest pain, Myoview suggestive of prior inferior MI, low risk due to no ischemia. He was seen in May 2016 with exertional chest tightness. The plan was evaluate with GXT, however this did not occur. He came to the hospital on 05/11/2015 with anterior STEMI, cardiac catheterization at that time showed three-vessel CAD with 90% proximal RCA, 80% mid RCA, 80% OM3, 100% prox LAD occlusion treated with DESx2. Echocardiogram at the time showed EF 20-25% with akinesis of mid-apical anterior, basal mid anteroseptal, mid anterolateral, apical septal, apical lateral and apical myocardium. As for his residual RCA is disease, it was medical managed with plan of PCI if has recurrent symptoms. He was discharged on 05/14/2015. Unfortunately 2 hours after discharge, he had acute onset of confusion and decreased vision in the right visual field concerning for L PCA CVA. CT scan did not show any cranial abnormality. He was treated in the ED with TPA, unfortunately he developed acute lower GI bleed with dropping hemoglobin requiring transfusion with FFP. Follow-up MRI obtained on 05/15/2015 showed acute infarct in the left occipital lobe with chronic microvascular ischemic changes. He continued to have residual right visual field deficit since then. Echocardiogram obtained on 05/15/2015 showed EF  35-40%, moderate hypokinesis of mid-apical anteroseptal myocardium, moderate hypokinesis of anterior and apical myocardium, grade 1 diastolic dysfunction. His Brilinta was switched to Plavix. He was later discharged to inpatient rehabilitation and was released from rehabilitation on 10/21. He came in on 06/20/2015 with acute respiratory failure when he woke up feeling short of breath around 4 AM on the day of arrival. He also endorsed significant orthopnea and paroxysmal nocturnal dyspnea.          He was admitted for IV diuresis. He had a significant urinary output with IV Lasix. He was seen in the morning of 06/16/2015 at which time his weight has decreased from 185 pounds to 175 pounds which will serve as his dry weight.  He has diuresed a total of 5.3 L. He ambulated with nursing staff, his O2 saturation maintained at 95-98% with ambulation.   Patient is here for posthospital follow-up. He's been seen by home health RN twice per week. His wife's states his weight has gone up from 180 to 186 pounds. He denies orthopnea, lower extremity edema, shortness of breath or PND. His blood pressure at home is running around [id number] systolic.   He also denies nausea, vomiting, fever, chest pain, shortness of breath, orthopnea, dizziness, PND, cough, congestion, abdominal pain, hematochezia, melena, lower extremity edema, claudication.  Wt Readings from Last 3 Encounters:  07/04/15 184 lb 4.8 oz (83.598 kg)  06/23/15 175 lb 8 oz (79.606 kg)  06/01/15 184 lb 11.2 oz (83.779 kg)     Past Medical History  Diagnosis Date  . ST elevation myocardial infarction (STEMI) involving left anterior descending (LAD) coronary artery with complication ([place])  05/11/2015    100% Prox LAD - PCI with overlapping DES x 2  . Coronary artery disease involving native heart 05/11/2015    Cath  three-vessel CAD with 90% prox RCA, 80% mid RCA, 80% OM3, 100% prox LAD occlusion treated with DESx2. RCA and OM residual treated medically for now,  however will need PCI if has recurrent CP  . CAD S/P percutaneous coronary angioplasty 05/11/2015    PCI to Prox-Mid LAD with Overlapping Promus Premier DES 3.0 x 38 & 3.0 x 16 (post-dilated to 3.5 mm)   . Ischemic cardiomyopathy 05/11/2015    EF 40% on cath 05/11/2015 after anterior STEMI, EF 20-25% on echo 05/13/2015;   . Carotid arterial disease ([place])   . Hyperlipidemia with target LDL less than 70   . Essential hypertension   . 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 ([place]) 05/14/2015    -- Given TPA.  MRI Brain: Acute L Occipital Lobe Infarct. Chronic microvascular ischemic changes in the white matter and left pons.  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 Carotid arterial disease ([place]) 06/2014; 01/09/2015    Followed by Dr. Donnetta Hutching of [facility] -- a) Carotid u/s: 40-59% bilat ICA stenosis; b) Mod 50-69% Bilateral R Int Carotid (heteroenous plaque), Normal L Vert A, unable to find R Vert A.   . Tobacco abuse     a. 30 yrs - 1.5 ppd. - Quit 05/11/2015  . Prediabetes Oct 2016    A1C 6.0 in Oct 2016  . H/O: GI bleed 05/15/2015    s/p Sigmoid Polypectomy 05/04/2015 - prior to STEMI on May 11, 2015; Had GI Bleed following TPA for CVA, while on ASA & Brilinta; Flex Sig - 1. Internal Hemorrhoids, 2. Distal sigmoid polypectomy site with flat Whitebase status post biopsy and tattoo with 36mL spot over 3 injections 3. Proximal sigmoid polypectomy site seen as well, 4) otherwise normal with no evidence of bleed  . Chronic combined systolic and diastolic congestive heart failure, NYHA class 2 ([place]) 05/11/2015    Current Outpatient Prescriptions  Medication Sig Dispense Refill  . aspirin EC 81 MG tablet Take 1 tablet (81 mg total) by mouth daily. 30 tablet 1  . carvedilol (COREG) 3.125 MG tablet Take 3 tablets (9.375 mg total) by mouth 2 (two) times daily with a meal. 180 tablet 5  . clopidogrel (PLAVIX) 75 MG tablet Take 1 tablet (75 mg total) by mouth  daily. 30 tablet 1  . ferrous sulfate 325 (65 FE) MG tablet Take 325 mg by mouth daily with breakfast.    . furosemide (LASIX) 40 MG tablet Take 1 tablet (40 mg total) by mouth daily. 30 tablet 3  . mirtazapine (REMERON SOL-TAB) 15 MG disintegrating tablet Take 1 tablet (15 mg total) by mouth at bedtime. 30 tablet 1  . nitroGLYCERIN (NITROSTAT) 0.4 MG SL tablet Place 1 tablet (0.4 mg total) under the tongue every 5 (five) minutes x 3 doses as needed for chest pain. 25 tablet 3  . pantoprazole (PROTONIX) 40 MG tablet Take 40 mg by mouth daily.     . potassium chloride SA (K-DUR,KLOR-CON) 20 MEQ tablet Take 1 tablet (20 mEq total) by mouth daily. 30 tablet 3  . rosuvastatin (CRESTOR) 40 MG tablet Take 1 tablet (40 mg total) by mouth daily at 6 PM. 90 tablet 3   No current facility-administered medications for this visit.    Allergies:    Allergies  Allergen Reactions  .  Latex Itching and Rash    Social History:  The patient  reports that he quit smoking about 7 weeks ago. His smoking use included Cigarettes. He has a 30 pack-year smoking history. He does not have any smokeless tobacco history on file. He reports that he does not drink alcohol or use illicit drugs.   Family history:   Family History  Problem Relation Age of Onset  . Adopted: Yes  . Other      Adopted - unaware of biological parent's histories.  . Other Mother     eczema   . Cancer Mother     ROS:  Please see the history of present illness.  All other systems reviewed and negative.   PHYSICAL EXAM: VS:  BP 134/58 mmHg  Pulse 60  Ht 5\' 6"  (1.676 m)  Wt 184 lb 4.8 oz (83.598 kg)  BMI 29.76 kg/m2 Well nourished, well developed, in no acute distress HEENT: Pupils are equal round react to light accommodation extraocular movements are intact.  Neck: no JVDNo cervical lymphadenopathy. Cardiac: Regular rate and rhythm without murmurs rubs or gallops. Lungs:  clear to auscultation bilaterally, no wheezing, rhonchi or  rales Abd: soft, nontender, positive bowel sounds all quadrants, no hepatosplenomegaly Ext: no lower extremity edema.  2+ radial and dorsalis pedis pulses. Skin: warm and dry Neuro:  Grossly normal    ASSESSMENT AND PLAN:  Problem List Items Addressed This Visit    Tobacco abuse (Chronic)   Hyperlipidemia with target LDL less than 70 (Chronic)   Essential hypertension - Primary (Chronic)   Chronic combined systolic and diastolic congestive heart failure, NYHA class 2 (HCC) (Chronic)   Cardiomyopathy, ischemic: EF improved to 35-40% from 20-25% post PCI LAD (Chronic)      Chronic combined systolic and diastolic heart failure. EF 35-40%. Based on the patient's weight increase of 6 pounds. Would have him take an extra Lasix tonight and if it has not improved, and thickened this additional Lasix tomorrow and then resume daily dosing. Overall he looks very well without gross fluid overload.  Post hospital basic metabolic panel shows creatinine was 1.43.  This is still elevated compared to the 0.94 on October 19.  He is on Coreg however, hold off on the Ace or ARB at this time.  Hyperlipidemia: Continue Crestor Essential hypertension: Blood pressure at home running around [id number] systolic. No changes other than additional Lasix today and possibly tomorrow  Tobacco abuse: Patient quit during last hospitalization.

## 2015-07-04 NOTE — Patient Instructions (Addendum)
Medication Instructions:   TAKE EXTRA DOSE OF FUROSEMIDE TODAY  WEIGH TOMORROW MORNING=IF WEIGHT NOT DOWN BY 3 LBS THEN TAKE EXTRA DOSE OF FUROSEMIDE TOMORROW THEN RETURN TO NORMAL DOSAGE  Follow-Up:  Your physician recommends that you schedule a follow-up appointment in: [place]   If you need a refill on your cardiac medications before your next appointment, please call your pharmacy.

## 2015-07-05 ENCOUNTER — Encounter: Payer: Self-pay | Admitting: Neurology

## 2015-07-05 ENCOUNTER — Ambulatory Visit (INDEPENDENT_AMBULATORY_CARE_PROVIDER_SITE_OTHER): Payer: Medicare Other | Admitting: Neurology

## 2015-07-05 VITALS — BP 121/67 | HR 66 | Ht 66.0 in | Wt 185.0 lb

## 2015-07-05 DIAGNOSIS — I10 Essential (primary) hypertension: Secondary | ICD-10-CM

## 2015-07-05 DIAGNOSIS — I639 Cerebral infarction, unspecified: Secondary | ICD-10-CM

## 2015-07-05 DIAGNOSIS — F329 Major depressive disorder, single episode, unspecified: Secondary | ICD-10-CM

## 2015-07-05 DIAGNOSIS — E785 Hyperlipidemia, unspecified: Secondary | ICD-10-CM | POA: Insufficient documentation

## 2015-07-05 DIAGNOSIS — I25119 Atherosclerotic heart disease of native coronary artery with unspecified angina pectoris: Secondary | ICD-10-CM | POA: Diagnosis not present

## 2015-07-05 DIAGNOSIS — F32A Depression, unspecified: Secondary | ICD-10-CM

## 2015-07-05 DIAGNOSIS — I63531 Cerebral infarction due to unspecified occlusion or stenosis of right posterior cerebral artery: Secondary | ICD-10-CM | POA: Diagnosis not present

## 2015-07-05 DIAGNOSIS — I2510 Atherosclerotic heart disease of native coronary artery without angina pectoris: Secondary | ICD-10-CM | POA: Insufficient documentation

## 2015-07-05 NOTE — Progress Notes (Signed)
STROKE NEUROLOGY FOLLOW UP NOTE  NAME: Damon Walker DOB: October 26, 1945  REASON FOR VISIT: stroke follow up HISTORY FROM: wife and chart  Today we had the pleasure of seeing Damon Walker in follow-up at our Neurology Clinic. Pt was accompanied by wife.   History Summary Damon Walker is a 69 y.o. male with history of HTN, hyperlipidemia, prediabetes, carotid artery disease, smoking, hot snare polpectomy on 05/04/2015, CAD s/p cath and stenting via right radial artery on 05/11/15, STEMI, ischemic cardiomyopathy admitted on 05/14/15 for acute onset confusion and right hemianopia. Damon Walker received IV t-PA 05/14/2015. Post tPA, Damon Walker developed symptomatic LGIB. Was given 2 FFP. GI consulted and considered bleeding from polypectomy. Flex sig without acute bleeding. bx done and showed no malignancy. MRI showed left PCA infarct. CTA head and neck showed left P2/3 occlusion with diffuse athero. 2D Echo 20-25% on 05/13/15 but 35-40% 05/15/15. LDL 186 and HgbA1c 6.0. His stroke was thought to related low EF and recent MI, Damon Walker was put on dual antiplatelet as well as resumed crestor. Damon Walker was then discharged to CIR.  Interval History During the interval time, the patient has been doing stable. Damon Walker stayed in CIR for 7-8 days and had psychology consult for cognitive impairment, was considered related to stroke and recommended outpt follow up if no improvement. Damon Walker is currently at home with home speech therapy and seems improved on cognition but not at baseline. Pt seems depressed mood, on Remeron and declined further medication treatment. Not see ophthalmology yet but has appointment in 6 months. Damon Walker was admitted again on 06/20/15 for CHF and atypical pneumonia, treated with antibiotics and lasix. Symptoms improved. Damon Walker followed with cardiology yesterday and was give two additional dose of lasix. BP today 121/67 in clinic  REVIEW OF SYSTEMS: Full 14 system review of systems performed and notable only for those listed  below and in HPI above, all others are negative:  Constitutional:   Cardiovascular:  Ear/Nose/Throat:   Skin:  Eyes:  Loss of vision Respiratory:   Gastroitestinal:  Black stool Genitourinary:  Hematology/Lymphatic:   Endocrine:  Musculoskeletal:   Allergy/Immunology:   Neurological:  Memory loss Psychiatric: Agitation, confusion, depression Sleep:   The following represents the patient's updated allergies and side effects list: Allergies  Allergen Reactions  . Latex Itching and Rash    The neurologically relevant items on the patient's problem list were reviewed on today's visit.  Neurologic Examination  A problem focused neurological exam (12 or more points of the single system neurologic examination, vital signs counts as 1 point, cranial nerves count for 8 points) was performed.  Blood pressure 121/67, pulse 66, height 5\' 6"  (1.676 m), weight 185 lb (83.915 kg).  General - Well nourished, well developed, in no apparent distress, mildly depressed mood.  Ophthalmologic - Fundi not visualized due to eye movement.  Cardiovascular - Regular rate and rhythm.  Mental Status -  Level of arousal and orientation to time, place, and person were intact. Language including expression, naming, repetition, comprehension was assessed and found intact. Fund of Knowledge was assessed and was intact.  Cranial Nerves II - XII - II - right homonymous hemianopia. III, IV, VI - Extraocular movements intact. V - Facial sensation intact bilaterally. VII - Facial movement intact bilaterally. VIII - Hearing & vestibular intact bilaterally. X - Palate elevates symmetrically. XI - Chin turning & shoulder shrug intact bilaterally. XII - Tongue protrusion intact.  Motor Strength - The patient's strength was normal in all extremities and pronator  drift was absent.  Bulk was normal and fasciculations were absent.   Motor Tone - Muscle tone was assessed at the neck and appendages and was  normal.  Reflexes - The patient's reflexes were 1+ in all extremities and Damon Walker had no pathological reflexes.  Sensory - Light touch, temperature/pinprick were assessed and were normal.    Coordination - The patient had normal movements in the hands and feet with no ataxia or dysmetria.  Tremor was absent.  Gait and Station - The patient's transfers, posture, gait, station, and turns were observed as normal.  Data reviewed: I personally reviewed the images and agree with the radiology interpretations.  Dg Chest 1 View 05/11/2015 IMPRESSION: Increased interstitial densities in both lungs. Findings are suggestive for interstitial pulmonary edema.  Ct Head Wo Contrast 05/14/2015 IMPRESSION: No acute finding by CT. 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   Ct Angio head and Neck W/cm &/or Wo/cm 05/15/2015 IMPRESSION: 1. Acute left PCA branch infarct involving the occipital lobe. No hemorrhagic conversion post tPA. 2. Poor flow in the non dominant right vertebral artery, favor high-grade proximal stenosis over dissection. 3. Mild to moderate left P1 segment stenosis. No treatable flow limiting intracranial stenosis. 4. Extensive atherosclerosis, predominately extracranial. Proximal left ICA stenosis approaching 50%. 5. 3 mm left paraclinoid ICA aneurysm.   MRI 05/15/2015 Acute infarct left occipital lobe. Chronic microvascular ischemic changes in the white matter and left pons. Encephalomalacia right temporal and frontal lobe is chronic and unchanged. This may be due to in utero infarct.  2D Echo  05/13/2015 - Left ventricle: The cavity size was normal. Wall thickness wasincreased in a pattern of mild LVH. Systolic function wasseverely reduced. The estimated ejection fraction was in therange of 20% to 25%. Diastolic function is abnormal, indeterminate grade. -  Regional wall motion abnormality: Akinesis of the mid-apicalanterior, basal-mid anteroseptal, mid anterolateral, apicalseptal, apical lateral, and apical myocardium. - Aortic valve: Valve area (VTI): 2.76 cm^2. Valve area (Vmax):3.36 cm^2. - Systemic veins: IVC is small suggesting low RA pressures andhypovolemia.  05/15/15 - Left ventricle: The cavity size was normal. Wall thickness wasincreased in a pattern of mild LVH. Systolic function wasmoderately reduced. The estimated ejection fraction was in therange of 35% to 40%. There is moderate hypokinesis of themid-apicalanteroseptal myocardium. There is moderate hypokinesisof the anterior and apical myocardium. Doppler parameters areconsistent with abnormal left ventricular relaxation (grade 1diastolic dysfunction). - Left atrium: The atrium was mildly dilated. - Right atrium: The atrium was mildly dilated.  Flex Sig 1. Internal hemorrhoids small  2. Distal sigmoid polypectomy site with flat Whitebase status post biopsy and tattoo with 86mL of spot over 3 injections 3. Proximal sigmoid polypectomy site seen as well 4. Otherwise within normal limits to mid descending without signs of bleeding from above 5. Biopsy showed no malignancy  Component     Latest Ref Rng 05/15/2015  Cholesterol     0 - 200 mg/dL 163  Triglycerides     <150 mg/dL 144  HDL Cholesterol     >40 mg/dL 23 (L)  Total CHOL/HDL Ratio      7.1  VLDL     0 - 40 mg/dL 29  LDL (calc)     0 - 99 mg/dL 111 (H)  Hemoglobin A1C     4.8 - 5.6 % 6.2 (H)  Mean Plasma Glucose 131    Assessment: As you may recall,  Damon Walker is a 69 y.o. Caucasian male with PMH of HTN, hyperlipidemia, prediabetes, smoking, hot snare polpectomy on 05/04/2015, CAD s/p cath and stenting via right radial artery on 05/11/15, STEMI, ischemic cardiomyopathy admitted on 05/14/15 for acute onset confusion and right hemianopia. Damon Walker received IV t-PA 05/14/2015. Post tPA, Damon Walker developed symptomatic LGIB.  Received 2 FFP. GI consulted and considered bleeding from polypectomy. Flex sig without acute bleeding. bx done and showed no malignancy. MRI showed left PCA infarct. CTA head and neck showed left P2/3 occlusion with diffuse athero. 2D Echo 20-25% on 05/13/15 but 35-40% 05/15/15. LDL 186 and HgbA1c 6.0. His stroke was thought to related low EF and recent MI, Damon Walker was put on dual antiplatelet as well as resumed crestor. Damon Walker stayed in CIR for 7-8 days and had psychology consult for cognitive impairment, was considered related to stroke and recommended outpt follow up if no improvement. Damon Walker is currently at home with home speech therapy and seems improved on cognition but not at baseline. Pt seems depressed mood, on Remeron and declined further medication treatment. Following up with cardiology for CHF, on Lasix.  Plan:  - continue ASA and plavix for total 3 months, and then plavix alone - continue crestor for stroke prevention - increase daily activity and more mental exercise - follow up with cardiology  - Follow up with your primary care physician for stroke risk factor modification. Recommend maintain blood pressure goal <130/80, diabetes with hemoglobin A1c goal below 6.5% and lipids with LDL cholesterol goal below 70 mg/dL.  - continue speech therapy at home - follow up with ophthalmologist for visual field monitoring - no driving at this time, but may consider driving assessment if visual field improves over time - follow up in 3 months  I spent more than 25 minutes of face to face time with the patient. Greater than 50% of time was spent in counseling and coordination of care. We have discussed about cognitive impairment after stroke, no driving due to hemianopia, compliance with medication, and increasing home activity and mental exercises.   No orders of the defined types were placed in this encounter.    No orders of the defined types were placed in this encounter.    Patient Instructions    - continue ASA and plavix for another one month, and then plavix alone - continue crestor for stroke prevention - increase daily activity and more mental exercise - follow up with cardiology  - Follow up with your primary care physician for stroke risk factor modification. Recommend maintain blood pressure goal <130/80, diabetes with hemoglobin A1c goal below 6.5% and lipids with LDL cholesterol goal below 70 mg/dL.  - continue speech therapy at home - follow up with eye doctor for visual field monitoring - no driving at this time, but may consider driving assessment if visual field improves over time - follow up in 3 months   [name], MD PhD [facility] [address], [place], [place] [address] [phone]

## 2015-07-05 NOTE — Patient Instructions (Signed)
-   continue ASA and plavix for another one month, and then plavix alone - continue crestor for stroke prevention - increase daily activity and more mental exercise - follow up with cardiology  - Follow up with your primary care physician for stroke risk factor modification. Recommend maintain blood pressure goal <130/80, diabetes with hemoglobin A1c goal below 6.5% and lipids with LDL cholesterol goal below 70 mg/dL.  - continue speech therapy at home - follow up with eye doctor for visual field monitoring - no driving at this time, but may consider driving assessment if visual field improves over time - follow up in 3 months

## 2015-07-05 NOTE — Telephone Encounter (Signed)
Patient is in office visit today with Dr. Erlinda Hong pt is seeing GI doctor and he and wife knows that colon polyp showed not malignancy.

## 2015-07-13 ENCOUNTER — Telehealth: Payer: Self-pay | Admitting: *Deleted

## 2015-07-13 DIAGNOSIS — I5042 Chronic combined systolic (congestive) and diastolic (congestive) heart failure: Secondary | ICD-10-CM

## 2015-07-13 DIAGNOSIS — Z79899 Other long term (current) drug therapy: Secondary | ICD-10-CM

## 2015-07-13 NOTE — Telephone Encounter (Signed)
Left message to call back   need labs done  1-2 week prior to next office visit 08/13/15

## 2015-07-13 NOTE — Telephone Encounter (Signed)
-----   Message from Leonie Man, MD sent at 07/10/2015  6:01 PM EST ----- Relatively stable kidney function. We should recheck in about a week or so prior to restarting ACE inhibitor/ARB  [name], DAVID W, MD  Pls forward to :  [name], MD

## 2015-07-13 NOTE — Telephone Encounter (Signed)
Spoke to patient'S wife. Result given . Verbalized understanding  labslip mailed  wife request after appointment in the afternoon changed to 09/11/12 at 4 pm

## 2015-08-02 ENCOUNTER — Ambulatory Visit: Payer: Medicare Other | Admitting: Cardiology

## 2015-08-17 ENCOUNTER — Telehealth: Payer: Self-pay | Admitting: *Deleted

## 2015-08-17 LAB — BASIC METABOLIC PANEL
BUN: 27 mg/dL — AB (ref 7–25)
CALCIUM: 9.6 mg/dL (ref 8.6–10.3)
CO2: 29 mmol/L (ref 20–31)
CREATININE: 1.31 mg/dL — AB (ref 0.70–1.18)
Chloride: 101 mmol/L (ref 98–110)
GLUCOSE: 122 mg/dL — AB (ref 65–99)
Potassium: 4.5 mmol/L (ref 3.5–5.3)
SODIUM: 141 mmol/L (ref 135–146)

## 2015-08-17 NOTE — Telephone Encounter (Signed)
LEFT MESSAGE TO CALL BACK- REGARDS TO LAB RESULTS  ROUTE LABS TO DR Orland Mustard

## 2015-08-17 NOTE — Telephone Encounter (Signed)
-----   Message from Leonie Man, MD sent at 08/17/2015  6:19 AM EST ----- Overall, renal function looks a bit improved / stable.  [name], Leonie Green, MD  Pls forward to The PCP - [name], MD

## 2015-08-23 ENCOUNTER — Ambulatory Visit: Payer: Medicare Other | Admitting: Cardiology

## 2015-08-28 ENCOUNTER — Telehealth: Payer: Self-pay | Admitting: *Deleted

## 2015-08-28 NOTE — Telephone Encounter (Signed)
Spoke to patien'ts wife. Result given . Verbalized understanding  

## 2015-08-28 NOTE — Telephone Encounter (Signed)
-----   Message from Leonie Man, MD sent at 08/17/2015  6:19 AM EST ----- Overall, renal function looks a bit improved / stable.  [name], Leonie Green, MD  Pls forward to The PCP - [name], MD

## 2015-09-05 ENCOUNTER — Ambulatory Visit: Payer: Medicare Other | Admitting: Cardiology

## 2015-09-12 ENCOUNTER — Encounter: Payer: Self-pay | Admitting: Cardiology

## 2015-09-12 ENCOUNTER — Ambulatory Visit (INDEPENDENT_AMBULATORY_CARE_PROVIDER_SITE_OTHER): Payer: Medicare Other | Admitting: Cardiology

## 2015-09-12 VITALS — BP 126/64 | HR 66 | Ht 68.0 in | Wt 184.4 lb

## 2015-09-12 DIAGNOSIS — I2510 Atherosclerotic heart disease of native coronary artery without angina pectoris: Secondary | ICD-10-CM

## 2015-09-12 DIAGNOSIS — Z9861 Coronary angioplasty status: Secondary | ICD-10-CM

## 2015-09-12 DIAGNOSIS — Z955 Presence of coronary angioplasty implant and graft: Secondary | ICD-10-CM

## 2015-09-12 DIAGNOSIS — I2109 ST elevation (STEMI) myocardial infarction involving other coronary artery of anterior wall: Secondary | ICD-10-CM

## 2015-09-12 DIAGNOSIS — I63532 Cerebral infarction due to unspecified occlusion or stenosis of left posterior cerebral artery: Secondary | ICD-10-CM

## 2015-09-12 DIAGNOSIS — I10 Essential (primary) hypertension: Secondary | ICD-10-CM | POA: Diagnosis not present

## 2015-09-12 DIAGNOSIS — I255 Ischemic cardiomyopathy: Secondary | ICD-10-CM | POA: Diagnosis not present

## 2015-09-12 DIAGNOSIS — Z720 Tobacco use: Secondary | ICD-10-CM

## 2015-09-12 DIAGNOSIS — E785 Hyperlipidemia, unspecified: Secondary | ICD-10-CM

## 2015-09-12 NOTE — Assessment & Plan Note (Signed)
He has not smoked since his hospitalization for MI followed by stroke. No desire to smoke. I congratulated him on his efforts.

## 2015-09-12 NOTE — Assessment & Plan Note (Signed)
2 stents including a 38 & 16 mm stent in the LAD. Because of bleeding issues in the setting of aspirin, Brilinta and TPA, he was switched from Brilinta that Plavix. Somehow he was also been taken off aspirin. Presumably by his neurologist. This is clearly not according to ACS guidelines. However with his bleeding issues, I would continue Plavix essentially indefinitely because of the amount of stent and coexisting disease.

## 2015-09-12 NOTE — Assessment & Plan Note (Signed)
He really has not had problems with hypertension since his MI and stroke. Hopefully as his blood pressure normalizes, we can add afterload reduction for his ischemic cardiomyopathy.

## 2015-09-12 NOTE — Assessment & Plan Note (Signed)
Pain followed by neurology. On Plavix and statin

## 2015-09-12 NOTE — Patient Instructions (Addendum)
CONTINUE WITH TAKING 1/2 TABLET OF FUROSEMIDE DAILY UNLESS YOU GAIN 3 LBS IN A NIGHT THEN TAKE A WHOLE TABLET UNTIL WEIGHT GOES BACK TO REGULAR WEIGHT.  NO OTHER CHANGE IN CURRENT  MEDICATIONS  Your physician wants you to follow-up in April  13 ,2017 AT 4 PM WITH DR [facility].    If you need a refill on your cardiac medications before your next appointment, please call your pharmacy.

## 2015-09-12 NOTE — Assessment & Plan Note (Signed)
Large anterior MI with reduced EF. Unfortunately, he was not able to do cardiac rehabilitation because of financial issues. He did have neuro rehabilitation. We talked about trying to gradually build up his activity levels.

## 2015-09-12 NOTE — Progress Notes (Signed)
PCP: [name], MD  Clinic Note: Chief Complaint  Patient presents with  . Follow-up    3 month post hosp//STEMI//pt states he had a virus, felt dizzy and weak--nothing since and no other Sx.  . Coronary Artery Disease    Multivessel CAD with anterior STEMI status post PCI to LAD.  Marland Kitchen Cardiomyopathy    Ischemic    HPI: Damon Walker is a 70 y.o. male with a PMH below who presents today for three-month follow-up following his anterior STEMI and stroke. Anterior STEMI on October 6 with PCI to LAD. Severe ischemic cardiac myopathy noted, considered possible staged PCI of diffusely diseased RCA. 2 hours following discharge, he is admitted with left PCA distribution stroke treated with lytics. Residual affected right hemianopsia. Post TPA he had a GI bleed. Finally restarted back on an antiplatelet agent - converted to Plavix or Brilinta without aspirin. Follow-up echo showed improved EF.  Damon Walker was last by me seen on 06/01/2015 - please see full history of his prior hospitalization in that clinic note.  Recent Hospitalizations: Nov 15-18 2016 - for ? PNA (dyspnea) & CHF --> he was diuresed about 10 pounds from 5.3 L). He was also treated with a seven-day course of antibiotics. Due to borderline hypotension, ACE inhibitor/ARB was not added. -- He was seen by Tarri Fuller, PA on November 29 hospital follow-up. Blood pressures at home are slightly improved. His weight was back up to about 186 pounds at home. However he was not noticing significant dyspnea at that time.  His Lasix dose was increased for a short-term. No additional medications were added. Subsequent lesions Lasix was dropped from 40 mg a day to 20 mm today by his PCP.  Studies Reviewed: No new studies  Interval History: Damon Walker presents today, mostly recovering from a viral "flulike illness that are going on for about a week now. He's been feeling Puny since onset of that,. He is somewhat feeling a little bit  improved now but still has a little he started having a little bit of near syncopal type symptoms when he had onset of his flulike illness, but has not had a true syncopal spell. No exacerbation of neurologic symptoms. No significant PND, orthopnea, and only mild edema. No anginal symptoms with rest or with minimal exertion. He does not do that much in the way of exertion, mostly limited by hip pain in his left hip.   No chest pain or shortness of breath with rest or exertion. No palpitations, lightheadedness, dizziness, weakness or syncope/near syncope. No TIA/amaurosis fugax symptoms. No melena, hematochezia, hematuria, or epstaxis. No claudication.  ROS: A comprehensive was performed. Review of Systems  Constitutional: Negative for malaise/fatigue.  HENT: Positive for congestion. Negative for nosebleeds.   Eyes:       He says his visual symptoms are improving somewhat  Respiratory: Positive for cough and shortness of breath (When he first started feeling sick). Negative for hemoptysis, sputum production and wheezing.   Cardiovascular:       Per history of present illness  Gastrointestinal: Positive for heartburn.  Genitourinary: Negative.   Musculoskeletal: Positive for myalgias (With flulike illness, but now improved) and joint pain (mostly left hip).  Neurological: Positive for speech change (verall, his speech is become more fluent and clear.). Negative for focal weakness, weakness and headaches.  All other systems reviewed and are negative.   Past Medical History  Diagnosis Date  . ST elevation myocardial infarction (STEMI) involving left anterior descending (LAD) coronary  artery with complication ([place]) [id number]    100% Prox LAD - PCI with overlapping DES x 2  . Coronary artery disease involving native heart 05/11/2015    Cath  three-vessel CAD with 90% prox RCA, 80% mid RCA, 80% OM3, 100% prox LAD occlusion treated with DESx2. RCA and OM residual treated medically for now,  however will need PCI if has recurrent CP  . CAD S/P percutaneous coronary angioplasty 05/11/2015    PCI to Prox-Mid LAD with Overlapping Promus Premier DES 3.0 x 38 & 3.0 x 16 (post-dilated to 3.5 mm)   . Ischemic cardiomyopathy 05/11/2015    EF 40% on cath 05/11/2015 after anterior STEMI, EF 20-25% on echo 05/13/2015;   . Chronic combined systolic and diastolic congestive heart failure, NYHA class 2 ([place]) 05/11/2015 - 05/15/15    a. EF 20-25% with anterior-anteroseptal akinesis (immediately post anterior STEMI).  ; b. 10/10/'16 Echo: EF 35-40% with moderate mid-apical anteroseptal, anterior and apical hypokinesis.  . Hyperlipidemia with target LDL less than 70   . Essential hypertension   . 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 ([place]) 05/14/2015    -- Given TPA.  MRI Brain: Acute L Occipital Lobe Infarct. Chronic microvascular ischemic changes in the white matter and left pons.  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 Carotid arterial disease ([place]) 06/2014; 01/09/2015    Followed by Dr. Donnetta Hutching of [facility] -- a) Carotid u/s: 40-59% bilat ICA stenosis; b) Mod 50-69% Bilateral R Int Carotid (heteroenous plaque), Normal L Vert A, unable to find R Vert A.   . Tobacco abuse     a. 30 yrs - 1.5 ppd. - Quit 05/11/2015  . Prediabetes Oct 2016    A1C 6.0 in Oct 2016  . H/O: GI bleed 05/15/2015    s/p Sigmoid Polypectomy 05/04/2015 - prior to STEMI on May 11, 2015; Had GI Bleed following TPA for CVA, while on ASA & Brilinta; Flex Sig - 1. Internal Hemorrhoids, 2. Distal sigmoid polypectomy site with flat [place] status post biopsy and tattoo with 93mL spot over 3 injections 3. Proximal sigmoid polypectomy site seen as well, 4) otherwise normal with no evidence of bleed  . Chronic combined systolic and diastolic congestive heart failure, NYHA class 2 ([place])   . Vision abnormalities     Right hemianopsia    Past Surgical History    Procedure Laterality Date  . S/p appendectomy      Age 50  . S/p inguinal hernia repair      In his 20's.  . Cardiac catheterization N/A 05/11/2015    Procedure: Left Heart Cath and Coronary Angiography;  Surgeon: Peter M Martinique, MD;  Location: [facility];  Service: Cardiovascular;  100% pLAD (long lesion). RCA - prox 90%, mid 80%. OM2 & OM3 80% (OM2 & 3 not necessarily PCI targets); EF 35-45% with Anterio HK  . Cardiac catheterization N/A 05/11/2015    Procedure: Coronary Stent Intervention;  Surgeon: Peter M Martinique, MD;  Location: [facility];  Service: Cardiovascular; PCI to Prox-Mid LAD with Overlapping Promus Premier DES 3.0 x 38 & 3.0 x 16 (post-dilated to 3.5 mm)    . Flexible sigmoidoscopy N/A 05/18/2015    Procedure: FLEXIBLE SIGMOIDOSCOPY;  Surgeon: Clarene Essex, MD;  Location: [facility];  Service: Endoscopy;  Laterality: N/A;  . Transthoracic echocardiogram  05/13/2015    Mlid LVH. EF 20-25% - Akinesis of mid-Apical Anterior, basal-mid AnteroSeptal, Mid  Anterolateral, apical septal, apical-lateral & apical  walls.  Abnormal Diastolic function - indeterminate  . Transthoracic echocardiogram  05/15/2015    Mild LVH, EF 35-40% - Mod HK of mid-apical anteroseptal, anterior & apical walls.  Gr 1 DD. Mild bilateral Atrial Enlargement.   Prior to Admission medications   Medication Sig Start Date End Date Taking? Authorizing Provider  carvedilol (COREG) 3.125 MG tablet Take 3 tablets (9.375 mg total) by mouth 2 (two) times daily with a meal. 05/14/15  Yes Almyra Deforest, PA  clopidogrel (PLAVIX) 75 MG tablet Take 1 tablet (75 mg total) by mouth daily. 05/26/15  Yes Ivan Anchors Love, PA-C  ferrous sulfate 325 (65 FE) MG tablet Take 325 mg by mouth daily with breakfast.   Yes Historical Provider, MD  furosemide (LASIX) 40 MG tablet Take 1 tablet (40 mg total) by mouth daily. Patient taking differently: Take 40 mg by mouth daily. Take .5 tab daily 06/23/15  Yes Almyra Deforest, PA  mirtazapine  (REMERON SOL-TAB) 15 MG disintegrating tablet Take 1 tablet (15 mg total) by mouth at bedtime. 05/26/15  Yes Ivan Anchors Love, PA-C  nitroGLYCERIN (NITROSTAT) 0.4 MG SL tablet Place 1 tablet (0.4 mg total) under the tongue every 5 (five) minutes x 3 doses as needed for chest pain. 05/14/15  Yes Almyra Deforest, PA  pantoprazole (PROTONIX) 40 MG tablet Take 40 mg by mouth daily.    Yes Historical Provider, MD  potassium chloride SA (K-DUR,KLOR-CON) 20 MEQ tablet Take 1 tablet (20 mEq total) by mouth daily. 06/23/15  Yes Almyra Deforest, PA  rosuvastatin (CRESTOR) 40 MG tablet Take 1 tablet (40 mg total) by mouth daily at 6 PM. 05/14/15  Yes Almyra Deforest, PA   Allergies  Allergen Reactions  . Latex Itching and Rash    Social History   Social History  . Marital Status: Married    Spouse Name: N/A  . Number of Children: 3  . Years of Education: N/A   Occupational History  . retired from [employer]  . Smoking status: Former Smoker -- 1.00 packs/day for 30 years    Types: Cigarettes    Quit date: 04/06/2015  . Smokeless tobacco: None     Comment: Quit the day of his STEMI  . Alcohol Use: No  . Drug Use: No  . Sexual Activity: Not Asked   Other Topics Concern  . None   Social History Narrative   Lives in [place] with wife. Originally from [place].   Family History  Problem Relation Age of Onset  . Adopted: Yes  . Other      Adopted - unaware of biological parent's histories.  . Other Mother     eczema   . Cancer Mother     Wt Readings from Last 3 Encounters:  09/12/15 184 lb 6.4 oz (83.643 kg)  07/05/15 185 lb (83.915 kg)  07/04/15 184 lb 4.8 oz (83.598 kg)    PHYSICAL EXAM BP 126/64 mmHg  Pulse 66  Ht 5\' 8"  (1.727 m)  Wt 184 lb 6.4 oz (83.643 kg)  BMI 28.04 kg/m2 General appearance: alert, cooperative, appears stated age, no distress and borderline obese HEENT: Damon Walker/AT, EOMI, MMM, anicteric sclera Neck: no adenopathy, no carotid bruit and no JVD Lungs:  clear to auscultation bilaterally, normal percussion bilaterally and non-labored Heart: regular rate and rhythm, S1, S2 normal, no murmur, click, rub or gallop; non-displaced PMI Abdomen: soft, non-tender; bowel sounds normal; no masses, no organomegaly;  mild truncal obesity Extremities: extremities normal, atraumatic, no cyanosis, and trace edema. Bilateral spider telangiectasias with mild varicosities Pulses: 2+ and symmetric;  Skin: mobility and turgor normal with mild venous stasis changes in BLE. Neurologic: Mental status: Alert, oriented, thought content appropriate; Mood seems somewhat flat/depressed.  Cranial nerves: normal (II-XII grossly intact) with the exception of R Hemianopsia    Adult ECG Report Not checked  Other studies Reviewed: Additional studies/ records that were reviewed today include:  Recent Labs:   Lab Results  Component Value Date   CHOL 163 05/15/2015   HDL 23* 05/15/2015   LDLCALC 111* 05/15/2015   TRIG 144 05/15/2015   CHOLHDL 7.1 05/15/2015  -- recent check by PCP. Results not available   ASSESSMENT / PLAN: Problem List Items Addressed This Visit    Tobacco abuse (Chronic)    He has not smoked since his hospitalization for MI followed by stroke. No desire to smoke. I congratulated him on his efforts.      ST elevation myocardial infarction (STEMI) of anterior wall, subsequent episode of care [facility]) (Chronic)    Large anterior MI with reduced EF. Unfortunately, he was not able to do cardiac rehabilitation because of financial issues. He did have neuro rehabilitation. We talked about trying to gradually build up his activity levels.       Presence of drug coated stent in LAD coronary artery (Chronic)   Hyperlipidemia with target LDL less than 70 (Chronic)    Recently checked lipids showed an LDL of 111. He is on 40 of Crestor. Apparently his PCP just checked labs. I would like to see the results of those. May need to consider Zetia.       Essential hypertension (Chronic)    He really has not had problems with hypertension since his MI and stroke. Hopefully as his blood pressure normalizes, we can add afterload reduction for his ischemic cardiomyopathy.      Coronary artery disease involving native coronary artery of native heart without angina pectoris - Primary (Chronic)    He had pretty extensive PCI to the occluded LAD of the time his MI, also noted was disease pretty much throughout the entire RCA. There is also disease in the circumflex plant branches. I personally reviewed the films, and unless he has significant anginal symptoms, my impression would be the best course of action is medical management for his RCA disease. There are several focal severe lesions, but the entire vessel is diseased.  He is no longer on aspirin, only on Plavix. He is on Crestor and carvedilol.  With his hip pain, I'm not sure how much she can do cardiac rehabilitation. Also there was concern for financial trouble being able to afford cardiac rehabilitation. I talked about trying to get into Silver sneakers and consider water exercises to help with his hip.      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 On Plavix and statin      Cardiomyopathy, ischemic: EF improved to 35-40% from 20-25% post PCI LAD (Chronic)    Thankfully, the LAD PCI really helped his EF. He does have some occasional swelling, but no recurrent CHF since his last discharge. Continue on the current dose of Lasix, we discussed sliding scale Lasix with weight changes. Also if he requires more pillows for orthopnea, he should also take more Lasix. Currently on carvedilol at moderate dose.  I will not further titrate, or add ARB because of his  occasional dizziness. I think he may be getting little orthostatic. As his blood pressure stabilizes, would consider evening out his carvedilol to 12.5 mg twice a day. At that time  I would see if we could add an ARB (or potentially Entresto)      CAD S/P percutaneous coronary angioplasty (Chronic)    2 stents including a 38 & 16 mm stent in the LAD. Because of bleeding issues in the setting of aspirin, Brilinta and TPA, he was switched from Brilinta that Plavix. Somehow he was also been taken off aspirin. Presumably by his neurologist. This is clearly not according to ACS guidelines. However with his bleeding issues, I would continue Plavix essentially indefinitely because of the amount of stent and coexisting disease.         Current medicines are reviewed at length with the patient today. (+/- concerns) none The following changes have been made:   CONTINUE WITH TAKING 1/2 TABLET OF FUROSEMIDE DAILY UNLESS YOU GAIN 3 LBS IN A NIGHT THEN TAKE A WHOLE TABLET UNTIL WEIGHT GOES BACK TO REGULAR WEIGHT.  NO OTHER CHANGE IN CURRENT  MEDICATIONS  Your physician wants you to follow-up in April  2017  WITH DR [facility].  Studies Ordered:   No orders of the defined types were placed in this encounter.      Leonie Man, M.D., M.S. Interventional Cardiologist   Pager # [phone] Phone # [phone] [address] [place], [place] [address]

## 2015-09-12 NOTE — Assessment & Plan Note (Signed)
Thankfully, the LAD PCI really helped his EF. He does have some occasional swelling, but no recurrent CHF since his last discharge. Continue on the current dose of Lasix, we discussed sliding scale Lasix with weight changes. Also if he requires more pillows for orthopnea, he should also take more Lasix. Currently on carvedilol at moderate dose.  I will not further titrate, or add ARB because of his occasional dizziness. I think he may be getting little orthostatic. As his blood pressure stabilizes, would consider evening out his carvedilol to 12.5 mg twice a day. At that time I would see if we could add an ARB (or potentially Entresto)

## 2015-09-12 NOTE — Assessment & Plan Note (Signed)
He had pretty extensive PCI to the occluded LAD of the time his MI, also noted was disease pretty much throughout the entire RCA. There is also disease in the circumflex plant branches. I personally reviewed the films, and unless he has significant anginal symptoms, my impression would be the best course of action is medical management for his RCA disease. There are several focal severe lesions, but the entire vessel is diseased.  He is no longer on aspirin, only on Plavix. He is on Crestor and carvedilol.  With his hip pain, I'm not sure how much she can do cardiac rehabilitation. Also there was concern for financial trouble being able to afford cardiac rehabilitation. I talked about trying to get into Silver sneakers and consider water exercises to help with his hip.

## 2015-09-12 NOTE — Assessment & Plan Note (Signed)
Recently checked lipids showed an LDL of 111. He is on 40 of Crestor. Apparently his PCP just checked labs. I would like to see the results of those. May need to consider Zetia.

## 2015-10-09 ENCOUNTER — Encounter: Payer: Self-pay | Admitting: Neurology

## 2015-10-09 ENCOUNTER — Ambulatory Visit (INDEPENDENT_AMBULATORY_CARE_PROVIDER_SITE_OTHER): Payer: Medicare Other | Admitting: Neurology

## 2015-10-09 VITALS — BP 114/62 | HR 61 | Ht 66.0 in | Wt 186.8 lb

## 2015-10-09 DIAGNOSIS — F17200 Nicotine dependence, unspecified, uncomplicated: Secondary | ICD-10-CM | POA: Diagnosis not present

## 2015-10-09 DIAGNOSIS — I2109 ST elevation (STEMI) myocardial infarction involving other coronary artery of anterior wall: Secondary | ICD-10-CM | POA: Diagnosis not present

## 2015-10-09 DIAGNOSIS — I10 Essential (primary) hypertension: Secondary | ICD-10-CM | POA: Diagnosis not present

## 2015-10-09 DIAGNOSIS — I63532 Cerebral infarction due to unspecified occlusion or stenosis of left posterior cerebral artery: Secondary | ICD-10-CM | POA: Diagnosis not present

## 2015-10-09 DIAGNOSIS — E785 Hyperlipidemia, unspecified: Secondary | ICD-10-CM | POA: Diagnosis not present

## 2015-10-09 NOTE — Patient Instructions (Signed)
-   continue ASA 81 and plavix for stroke and cardiac prevention - continue crestor for stroke prevention - follow up with cardiology  - will repeat cardiac ultrasound to monitor heart function - Follow up with your primary care physician for stroke risk factor modification. Recommend maintain blood pressure goal <130/80, diabetes with hemoglobin A1c goal below 6.5% and lipids with LDL cholesterol goal below 70 mg/dL.  - follow up with ophthalmologist for visual field monitoring - no driving at this time due to visual field deficit - regular exercise and health diet - follow up in 6 months

## 2015-10-10 DIAGNOSIS — E785 Hyperlipidemia, unspecified: Secondary | ICD-10-CM | POA: Insufficient documentation

## 2015-10-10 NOTE — Progress Notes (Signed)
STROKE NEUROLOGY FOLLOW UP NOTE  NAME: Damon Walker DOB: [birth year]  REASON FOR VISIT: stroke follow up HISTORY FROM: wife and chart  Today we had the pleasure of seeing Damon Walker in follow-up at our Neurology Clinic. Pt was accompanied by wife.   History Summary Damon Walker is a 70 y.o. male with history of HTN, hyperlipidemia, prediabetes, carotid artery disease, smoking, hot snare polpectomy on 05/04/2015, CAD s/p cath and stenting via right radial artery on 05/11/15, STEMI, ischemic cardiomyopathy admitted on 05/14/15 for acute onset confusion and right hemianopia. He received IV t-PA 05/14/2015. Post tPA, he developed symptomatic LGIB. Was given 2 FFP. GI consulted and considered bleeding from polypectomy. Flex sig without acute bleeding. bx done and showed no malignancy. MRI showed left PCA infarct. CTA head and neck showed left P2/3 occlusion with diffuse athero. 2D Echo 20-25% on 05/13/15 but 35-40% 05/15/15. LDL 186 and HgbA1c 6.0. His stroke was thought to related low EF and recent MI, he was put on dual antiplatelet as well as resumed crestor. He was then discharged to CIR.  07/05/15 follow up - the patient has been doing stable. He stayed in CIR for 7-8 days and had psychology consult for cognitive impairment, was considered related to stroke and recommended outpt follow up if no improvement. He is currently at home with home speech therapy and seems improved on cognition but not at baseline. Pt seems depressed mood, on Remeron and declined further medication treatment. Not see ophthalmology yet but has appointment in 6 months. He was admitted again on 06/20/15 for CHF and atypical pneumonia, treated with antibiotics and lasix. Symptoms improved. He followed with cardiology yesterday and was give two additional dose of lasix. BP today 121/67 in clinic  Interval History During the interval time, pt has been doing well. No recurrent stroke like symptoms. No GIB. Has  followed with Dr. Ellyn Hack in cardiology, continued on plavix but recommended DAPT. Pt has finished DAPT and currently on plavix alone. I agree that he probably needs DAPT for cardiac prevention too. Still has right hemianopia and not driving. BP today 114/62. Depression much improved on Remeron.   REVIEW OF SYSTEMS: Full 14 system review of systems performed and notable only for those listed below and in HPI above, all others are negative:  Constitutional:   Cardiovascular:  Ear/Nose/Throat:   Skin:  Eyes:   Respiratory:   Gastroitestinal:   Genitourinary:  Hematology/Lymphatic:   Endocrine:  Musculoskeletal:   Allergy/Immunology:   Neurological:  Memory loss Psychiatric:  Sleep:   The following represents the patient's updated allergies and side effects list: Allergies  Allergen Reactions  . Latex Itching and Rash    The neurologically relevant items on the patient's problem list were reviewed on today's visit.  Neurologic Examination  A problem focused neurological exam (12 or more points of the single system neurologic examination, vital signs counts as 1 point, cranial nerves count for 8 points) was performed.  Blood pressure 114/62, pulse 61, height 5\' 6"  (1.676 m), weight 186 lb 12.8 oz (84.732 kg).  General - Well nourished, well developed, in no apparent distress, mildly depressed mood.  Ophthalmologic - Fundi not visualized due to eye movement.  Cardiovascular - Regular rate and rhythm.  Mental Status -  Level of arousal and orientation to time, place, and person were intact. Language including expression, naming, repetition, comprehension was assessed and found intact. Fund of Knowledge was assessed and was intact.  Cranial Nerves II - XII - II -  right homonymous hemianopia. III, IV, VI - Extraocular movements intact. V - Facial sensation intact bilaterally. VII - Facial movement intact bilaterally. VIII - Hearing & vestibular intact bilaterally. X - Palate  elevates symmetrically. XI - Chin turning & shoulder shrug intact bilaterally. XII - Tongue protrusion intact.  Motor Strength - The patient's strength was normal in all extremities and pronator drift was absent.  Bulk was normal and fasciculations were absent.   Motor Tone - Muscle tone was assessed at the neck and appendages and was normal.  Reflexes - The patient's reflexes were 1+ in all extremities and he had no pathological reflexes.  Sensory - Light touch, temperature/pinprick were assessed and were normal.    Coordination - The patient had normal movements in the hands and feet with no ataxia or dysmetria.  Tremor was absent.  Gait and Station - The patient's transfers, posture, gait, station, and turns were observed as normal.  Data reviewed: I personally reviewed the images and agree with the radiology interpretations.  Dg Chest 1 View 05/11/2015 IMPRESSION: Increased interstitial densities in both lungs. Findings are suggestive for interstitial pulmonary edema.  Ct Head Wo Contrast 05/14/2015 IMPRESSION: No acute finding by CT. 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   Ct Angio head and Neck W/cm &/or Wo/cm 05/15/2015 IMPRESSION: 1. Acute left PCA branch infarct involving the occipital lobe. No hemorrhagic conversion post tPA. 2. Poor flow in the non dominant right vertebral artery, favor high-grade proximal stenosis over dissection. 3. Mild to moderate left P1 segment stenosis. No treatable flow limiting intracranial stenosis. 4. Extensive atherosclerosis, predominately extracranial. Proximal left ICA stenosis approaching 50%. 5. 3 mm left paraclinoid ICA aneurysm.   MRI 05/15/2015 Acute infarct left occipital lobe. Chronic microvascular ischemic changes in the white matter and left pons. Encephalomalacia right temporal and frontal lobe is  chronic and unchanged. This may be due to in utero infarct.  2D Echo  05/13/2015 - Left ventricle: The cavity size was normal. Wall thickness wasincreased in a pattern of mild LVH. Systolic function wasseverely reduced. The estimated ejection fraction was in therange of 20% to 25%. Diastolic function is abnormal, indeterminate grade. - Regional wall motion abnormality: Akinesis of the mid-apicalanterior, basal-mid anteroseptal, mid anterolateral, apicalseptal, apical lateral, and apical myocardium. - Aortic valve: Valve area (VTI): 2.76 cm^2. Valve area (Vmax):3.36 cm^2. - Systemic veins: IVC is small suggesting low RA pressures andhypovolemia.  05/15/15 - Left ventricle: The cavity size was normal. Wall thickness wasincreased in a pattern of mild LVH. Systolic function wasmoderately reduced. The estimated ejection fraction was in therange of 35% to 40%. There is moderate hypokinesis of themid-apicalanteroseptal myocardium. There is moderate hypokinesisof the anterior and apical myocardium. Doppler parameters areconsistent with abnormal left ventricular relaxation (grade 1diastolic dysfunction). - Left atrium: The atrium was mildly dilated. - Right atrium: The atrium was mildly dilated.  Flex Sig 1. Internal hemorrhoids small  2. Distal sigmoid polypectomy site with flat Whitebase status post biopsy and tattoo with 69mL of spot over 3 injections 3. Proximal sigmoid polypectomy site seen as well 4. Otherwise within normal limits to mid descending without signs of bleeding from above 5. Biopsy showed no malignancy  Component     Latest Ref Rng 05/15/2015  Cholesterol     0 - 200 mg/dL 163  Triglycerides     <150 mg/dL 144  HDL Cholesterol     >40 mg/dL  23 (L)  Total CHOL/HDL Ratio      7.1  VLDL     0 - 40 mg/dL 29  LDL (calc)     0 - 99 mg/dL 111 (H)  Hemoglobin A1C     4.8 - 5.6 % 6.2 (H)  Mean Plasma Glucose 131    Assessment: As you may recall, he is a 70  y.o. Caucasian male with PMH of HTN, hyperlipidemia, prediabetes, smoking, hot snare polpectomy on 05/04/2015, CAD s/p cath and stenting via right radial artery on 05/11/15, STEMI, ischemic cardiomyopathy admitted on 05/14/15 for acute onset confusion and right hemianopia. He received IV t-PA 05/14/2015. Post tPA, he developed symptomatic LGIB. Received 2 FFP. GI consulted and considered bleeding from polypectomy. Flex sig without acute bleeding. bx done and showed no malignancy. MRI showed left PCA infarct. CTA head and neck showed left P2/3 occlusion with diffuse athero. 2D Echo 20-25% on 05/13/15 but 35-40% 05/15/15. LDL 186 and HgbA1c 6.0. His stroke was thought to related low EF and recent MI, he was put on dual antiplatelet as well as resumed crestor. He stayed in CIR for 7-8 days and had psychology consult for cognitive impairment, was considered related to stroke and recommended outpt follow up if no improvement. Pt seems depressed mood, on Remeron much improved and declined further medication treatment. Followed up with cardiology for CHF, on Lasix, recommended DAPT.   Plan:  - continue ASA 81 and plavix for stroke and cardiac prevention - continue crestor for stroke prevention - follow up with cardiology  - will repeat cardiac ultrasound to monitor heart function - Follow up with your primary care physician for stroke risk factor modification. Recommend maintain blood pressure goal <130/80, diabetes with hemoglobin A1c goal below 6.5% and lipids with LDL cholesterol goal below 70 mg/dL.  - follow up with ophthalmologist for visual field monitoring - no driving at this time due to visual field deficit - regular exercise and health diet - follow up in 6 months  I spent more than 25 minutes of face to face time with the patient. Greater than 50% of time was spent in counseling and coordination of care. We have discussed about cognitive impairment after stroke, no driving due to hemianopia,  compliance with medication, and increasing home activity and mental exercises.   Orders Placed This Encounter  Procedures  . ECHOCARDIOGRAM COMPLETE    Standing Status: Future     Number of Occurrences:      Standing Expiration Date: 01/08/2017    Order Specific Question:  Where should this test be performed    Answer:  [facility])    Order Specific Question:  Complete or Limited study?    Answer:  Complete    Order Specific Question:  With Image Enhancing Agent or without Image Enhancing Agent?    Answer:  With Image Enhancing Agent    Order Specific Question:  Reason for exam-Echo    Answer:  Stroke  434.91 / I163.9    No orders of the defined types were placed in this encounter.    Patient Instructions  - continue ASA 81 and plavix for stroke and cardiac prevention - continue crestor for stroke prevention - follow up with cardiology  - will repeat cardiac ultrasound to monitor heart function - Follow up with your primary care physician for stroke risk factor modification. Recommend maintain blood pressure goal <130/80, diabetes with hemoglobin A1c goal below 6.5% and lipids with LDL cholesterol goal below 70  mg/dL.  - follow up with ophthalmologist for visual field monitoring - no driving at this time due to visual field deficit - regular exercise and health diet - follow up in 6 months    [name], MD PhD [facility] [address] [place], [place] [address] [phone]

## 2015-10-16 ENCOUNTER — Other Ambulatory Visit: Payer: Self-pay | Admitting: Physician Assistant

## 2015-10-16 NOTE — Telephone Encounter (Signed)
Rx(s) sent to pharmacy electronically.  

## 2015-10-16 NOTE — Telephone Encounter (Signed)
Please review for refill, Thank you. 

## 2015-10-23 ENCOUNTER — Other Ambulatory Visit: Payer: Self-pay | Admitting: Physician Assistant

## 2015-10-23 DIAGNOSIS — I213 ST elevation (STEMI) myocardial infarction of unspecified site: Secondary | ICD-10-CM | POA: Diagnosis not present

## 2015-10-23 DIAGNOSIS — I10 Essential (primary) hypertension: Secondary | ICD-10-CM | POA: Diagnosis not present

## 2015-10-23 DIAGNOSIS — I639 Cerebral infarction, unspecified: Secondary | ICD-10-CM | POA: Diagnosis not present

## 2015-10-23 DIAGNOSIS — N289 Disorder of kidney and ureter, unspecified: Secondary | ICD-10-CM | POA: Diagnosis not present

## 2015-10-23 DIAGNOSIS — Z8719 Personal history of other diseases of the digestive system: Secondary | ICD-10-CM | POA: Diagnosis not present

## 2015-10-23 DIAGNOSIS — E785 Hyperlipidemia, unspecified: Secondary | ICD-10-CM | POA: Diagnosis not present

## 2015-11-08 DIAGNOSIS — N289 Disorder of kidney and ureter, unspecified: Secondary | ICD-10-CM | POA: Diagnosis not present

## 2015-11-16 ENCOUNTER — Ambulatory Visit (INDEPENDENT_AMBULATORY_CARE_PROVIDER_SITE_OTHER): Payer: Medicare Other | Admitting: Cardiology

## 2015-11-16 ENCOUNTER — Encounter: Payer: Self-pay | Admitting: Cardiology

## 2015-11-16 ENCOUNTER — Other Ambulatory Visit: Payer: Self-pay

## 2015-11-16 VITALS — BP 128/68 | HR 68 | Ht 67.0 in | Wt 186.6 lb

## 2015-11-16 DIAGNOSIS — I5042 Chronic combined systolic (congestive) and diastolic (congestive) heart failure: Secondary | ICD-10-CM | POA: Diagnosis not present

## 2015-11-16 DIAGNOSIS — I2510 Atherosclerotic heart disease of native coronary artery without angina pectoris: Secondary | ICD-10-CM

## 2015-11-16 DIAGNOSIS — Z720 Tobacco use: Secondary | ICD-10-CM

## 2015-11-16 DIAGNOSIS — E785 Hyperlipidemia, unspecified: Secondary | ICD-10-CM | POA: Diagnosis not present

## 2015-11-16 DIAGNOSIS — I63532 Cerebral infarction due to unspecified occlusion or stenosis of left posterior cerebral artery: Secondary | ICD-10-CM

## 2015-11-16 DIAGNOSIS — I255 Ischemic cardiomyopathy: Secondary | ICD-10-CM | POA: Diagnosis not present

## 2015-11-16 DIAGNOSIS — I10 Essential (primary) hypertension: Secondary | ICD-10-CM

## 2015-11-16 MED ORDER — FUROSEMIDE 40 MG PO TABS
40.0000 mg | ORAL_TABLET | ORAL | Status: DC | PRN
Start: 2015-11-16 — End: 2016-07-09

## 2015-11-16 MED ORDER — POTASSIUM CHLORIDE CRYS ER 20 MEQ PO TBCR: 20.0000 meq | EXTENDED_RELEASE_TABLET | Freq: Every day | ORAL | Status: DC

## 2015-11-16 MED ORDER — CARVEDILOL 12.5 MG PO TABS: 12.5000 mg | ORAL_TABLET | Freq: Two times a day (BID) | ORAL | Status: DC

## 2015-11-16 NOTE — Progress Notes (Signed)
PCP: [name], MD  Clinic Note: Chief Complaint  Patient presents with  . Follow-up    Pt states no Sx.  . Coronary Artery Disease  . Cerebrovascular Accident    HPI: Damon Walker is a 70 y.o. male with a PMH below who presents today for 2 month f/u CAD-MI  Damon Walker was last seen in Feb 2017.  Recent Hospitalizations: none  Studies Reviewed: none  Apparently his Lasix was stopped by his PCP for renal issues.  Interval History: Damon Walker presents today really without any major complaints. He really onlyresidual hemianopsia, but otherwise has no recurrent issues from his stroke. No recurrent symptoms of chest tightness or pressure with rest or exertion. Some exertional dyspnea. Post himself beyond a certain level. He is yet to really get back into vigorous activity, however he was able to mow the lawn this past weekend without difficulty. Using a push mower. He denies any heart failure symptoms of PND, orthopnea or edema.  No palpitations, lightheadedness, dizziness, weakness or syncope/near syncope. No TIA/amaurosis fugax symptoms. No melena, hematochezia, hematuria, or epstaxis. No claudication.  ROS: A comprehensive was performed. Review of Systems  Constitutional: Negative for malaise/fatigue.  HENT: Negative for nosebleeds.   Eyes: Negative for blurred vision.       Right hemianopsia  Respiratory: Negative for cough, sputum production and wheezing.   Cardiovascular: Negative for claudication.       Per history of present illness  Gastrointestinal: Negative for heartburn, abdominal pain, constipation, blood in stool and melena.  Genitourinary: Negative for hematuria.  Musculoskeletal: Positive for joint pain. Negative for myalgias and falls.  Neurological: Positive for dizziness (with turning around fast). Negative for headaches.  Endo/Heme/Allergies: Does not bruise/bleed easily.  Psychiatric/Behavioral: Negative for depression and memory loss. The patient  is not nervous/anxious and does not have insomnia.   All other systems reviewed and are negative.  Past Medical History  Diagnosis Date  . ST elevation myocardial infarction (STEMI) involving left anterior descending (LAD) coronary artery with complication ([place]) [id number]    100% Prox LAD - PCI with overlapping DES x 2  . Coronary artery disease involving native heart 05/11/2015    Cath  three-vessel CAD with 90% prox RCA, 80% mid RCA, 80% OM3, 100% prox LAD occlusion treated with DESx2. RCA and OM residual treated medically for now, however will need PCI if has recurrent CP  . CAD S/P percutaneous coronary angioplasty 05/11/2015    PCI to Prox-Mid LAD with Overlapping Promus Premier DES 3.0 x 38 & 3.0 x 16 (post-dilated to 3.5 mm)   . Ischemic cardiomyopathy 05/11/2015    EF 40% on cath 05/11/2015 after anterior STEMI, EF 20-25% on echo 05/13/2015;   . Chronic combined systolic and diastolic congestive heart failure, NYHA class 2 ([place]) 05/11/2015 - 05/15/15    a. EF 20-25% with anterior-anteroseptal akinesis (immediately post anterior STEMI).  ; b. 10/10/'16 Echo: EF 35-40% with moderate mid-apical anteroseptal, anterior and apical hypokinesis.  . Hyperlipidemia with target LDL less than 70   . Essential hypertension   . 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  MRI Brain: Acute L Occipital Lobe Infarct. Chronic microvascular ischemic changes in the white matter and left pons.  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 Carotid arterial disease ([place]) 06/2014; 01/09/2015    Followed by Dr. Donnetta Hutching of [facility] -- a) Carotid u/s: 40-59% bilat ICA  stenosis; b) Mod 50-69% Bilateral R Int Carotid (heteroenous plaque), Normal L Vert A, unable to find R Vert A.   . Tobacco abuse     a. 30 yrs - 1.5 ppd. - Quit 05/11/2015  . Prediabetes Oct 2016    A1C 6.0 in Oct 2016  . H/O: GI bleed 05/15/2015    s/p  Sigmoid Polypectomy 05/04/2015 - prior to STEMI on May 11, 2015; Had GI Bleed following TPA for CVA, while on ASA & Brilinta; Flex Sig - 1. Internal Hemorrhoids, 2. Distal sigmoid polypectomy site with flat Whitebase status post biopsy and tattoo with 33mL spot over 3 injections 3. Proximal sigmoid polypectomy site seen as well, 4) otherwise normal with no evidence of bleed  . Chronic combined systolic and diastolic congestive heart failure, NYHA class 2 ([place])   . Vision abnormalities     Right hemianopsia    Past Surgical History  Procedure Laterality Date  . S/p appendectomy      Age 32  . S/p inguinal hernia repair      In his 20's.  . Cardiac catheterization N/A 05/11/2015    Procedure: Left Heart Cath and Coronary Angiography;  Surgeon: Peter M Martinique, MD;  Location: [facility];  Service: Cardiovascular;  100% pLAD (long lesion). RCA - prox 90%, mid 80%. OM2 & OM3 80% (OM2 & 3 not necessarily PCI targets); EF 35-45% with Anterio HK  . Cardiac catheterization N/A 05/11/2015    Procedure: Coronary Stent Intervention;  Surgeon: Peter M Martinique, MD;  Location: [facility];  Service: Cardiovascular; PCI to Prox-Mid LAD with Overlapping Promus Premier DES 3.0 x 38 & 3.0 x 16 (post-dilated to 3.5 mm)    . Flexible sigmoidoscopy N/A 05/18/2015    Procedure: FLEXIBLE SIGMOIDOSCOPY;  Surgeon: Clarene Essex, MD;  Location[facility];  Service: Endoscopy;  Laterality: N/A;  . Transthoracic echocardiogram  05/13/2015    Mlid LVH. EF 20-25% - Akinesis of mid-Apical Anterior, basal-mid AnteroSeptal, Mid Anterolateral, apical septal, apical-lateral & apical  walls.  Abnormal Diastolic function - indeterminate  . Transthoracic echocardiogram  05/15/2015    Mild LVH, EF 35-40% - Mod HK of mid-apical anteroseptal, anterior & apical walls.  Gr 1 DD. Mild bilateral Atrial Enlargement.   Prior to Admission medications   Medication Sig Start Date End Date Taking? Authorizing Provider  aspirin 81 MG  tablet Take 81 mg by mouth daily.   Yes Historical Provider, MD  carvedilol (COREG) 3.125 MG tablet TAKE THREE TABLETS BY MOUTH TWICE DAILY WITH MEALS 10/24/15  Yes Leonie Man, MD  clopidogrel (PLAVIX) 75 MG tablet Take 1 tablet (75 mg total) by mouth daily. 05/26/15  Yes Ivan Anchors Love, PA-C  ferrous sulfate 325 (65 FE) MG tablet Take 325 mg by mouth daily with breakfast.   Yes Historical Provider, MD  KLOR-CON M20 20 MEQ tablet TAKE ONE TABLET BY MOUTH ONCE DAILY 10/16/15  Yes Leonie Man, MD  mirtazapine (REMERON SOL-TAB) 15 MG disintegrating tablet Take 1 tablet (15 mg total) by mouth at bedtime. 05/26/15  Yes Ivan Anchors Love, PA-C  nitroGLYCERIN (NITROSTAT) 0.4 MG SL tablet Place 1 tablet (0.4 mg total) under the tongue every 5 (five) minutes x 3 doses as needed for chest pain. 05/14/15  Yes Almyra Deforest, PA  pantoprazole (PROTONIX) 40 MG tablet Take 40 mg by mouth daily.    Yes Historical Provider, MD  rosuvastatin (CRESTOR) 40 MG tablet Take 1 tablet (40 mg total) by mouth  daily at 6 PM. 05/14/15  Yes Almyra Deforest, PA  furosemide (LASIX) 40 MG tablet Take 1 tablet (40 mg total) by mouth daily. Patient not taking: Reported on 11/16/2015 06/23/15   Almyra Deforest, PA   Allergies  Allergen Reactions  . Latex Itching and Rash    Social History   Social History  . Marital Status: Married    Spouse Name: N/A  . Number of Children: 3  . Years of Education: N/A   Occupational History  . retired from [employer]  . Smoking status: Former Smoker -- 1.00 packs/day for 30 years    Types: Cigarettes    Quit date: 04/06/2015  . Smokeless tobacco: None     Comment: Quit the day of his STEMI  . Alcohol Use: No  . Drug Use: No  . Sexual Activity: Not Asked   Other Topics Concern  . None   Social History Narrative   Lives in [place] with wife. Originally from [place].   Family History  Problem Relation Age of Onset  . Adopted: Yes  . Other      Adopted -  unaware of biological parent's histories.  . Other Mother     eczema   . Cancer Mother     Wt Readings from Last 3 Encounters:  11/16/15 186 lb 9.6 oz (84.641 kg)  10/09/15 186 lb 12.8 oz (84.732 kg)  09/12/15 184 lb 6.4 oz (83.643 kg)    PHYSICAL EXAM BP 128/68 mmHg  Pulse 68  Ht 5\' 7"  (1.702 m)  Wt 186 lb 9.6 oz (84.641 kg)  BMI 29.22 kg/m2  SpO2 98% General appearance: alert, cooperative, appears stated age, no distress and borderline obese HEENT: [place]/AT, EOMI, MMM, anicteric sclera Neck: no adenopathy, no carotid bruit and no JVD Lungs: clear to auscultation bilaterally, normal percussion bilaterally and non-labored Heart: regular rate and rhythm, S1, S2 normal, no murmur, click, rub or gallop; non-displaced PMI Abdomen: soft, non-tender; bowel sounds normal; no masses, no organomegaly; mild truncal obesity Extremities: extremities normal, atraumatic, no cyanosis, and trace edema. Bilateral spider telangiectasias with mild varicosities Pulses: 2+ and symmetric;  Skin: mobility and turgor normal with mild venous stasis changes in BLE. Neurologic: Mental status: Alert, oriented, thought content appropriate; Mood seems somewhat flat/depressed.  Cranial nerves: normal (II-XII grossly intact) with the exception of R Hemianopsia    Adult ECG Report N/A  Other studies Reviewed: Additional studies/ records that were reviewed today include:  Recent Labs:  Checked by PCP. But not available.    ASSESSMENT / PLAN: Problem List Items Addressed This Visit    Tobacco abuse (Chronic)    Successful abstinence since his MI. I congratulated him on his efforts      Hyperlipidemia with target LDL less than 70 (Chronic)    Was not at goal by last check. His PCP will be following this up now. He is on 40 mg of Crestor. I would like to know what the results was last studied were, may need to consider Zetia.      Relevant Medications   aspirin 81 MG tablet   furosemide (LASIX) 40 MG  tablet   carvedilol (COREG) 12.5 MG tablet   Essential hypertension (Chronic)    Stable. Now on carvedilol. Increase in the 12.5 twice a day.      Relevant Medications   aspirin 81 MG tablet   furosemide (LASIX) 40 MG tablet   carvedilol (COREG) 12.5 MG tablet  Coronary artery disease involving native coronary artery of native heart without angina pectoris (Chronic)    Extensive LAD PCI with diffuse disease in the RCA but not adequate for PCI. Further anginal symptoms. On Plavix and back on aspirin. On stable dose of Crestor with carvedilol.      Relevant Medications   aspirin 81 MG tablet   furosemide (LASIX) 40 MG tablet   carvedilol (COREG) 12.5 MG tablet   Chronic combined systolic and diastolic congestive heart failure, NYHA class 2 (H[facility]) (Chronic)    Now probably more like class I-II symptoms. He is gradually starting to back to normal baseline level of activity. Lasix was stopped as a standing medicine, however I think is fine from a continue using on an as-needed basis for edema or weight gain based on sliding scale. Gradually titrating up beta blocker. Once we know his blood pressure stable, can consider adding ARB. This will all depend on repeat echo.      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 He is on an unusual dose of carvedilol. I'll titrate him up to 12 twice a day. After we reassess in the fall, I would consider starting losartan for afterload reduction. Need to know his renal function at that time - he has had some prior issues with orthostatic dizziness, therefore I'm reluctant to push too hard.   PLAN: Increased  carvedilol 12.5 twice a day and recheck echocardiogram.      Relevant Medications   aspirin 81 MG tablet   furosemide (LASIX) 40 MG tablet   carvedilol (COREG) 12.5 MG tablet   Other Relevant Orders   ECHOCARDIOGRAM COMPLETE      Current medicines are reviewed at length with the patient today. (+/- concerns) Lasix was stopped by PCP The following changes have been made:  USE LASIX AS NEEDED FOR SWELLING , SHORTNESS OF BREATH OR WEIGHING -TAKE POTASSIUM AS WELL  INCREASE CARVEDILOL 12.5 MG TWICE A DAY  UNTIL BOTTLE COMPLETED TAKE 3 TABLET IS MORNING , AND 4 TABLETS IN THE EVENING ( OLD BOTTLE)  Studies Ordered:   Orders Placed This Encounter  Procedures  . ECHOCARDIOGRAM COMPLETE    Follow-up 6 month with Dr Ellyn Hack.   Leonie Man, M.D., M.S. Interventional Cardiologist   Pager # [phone] Phone # [phone] [address] [place], [place] [address]

## 2015-11-16 NOTE — Patient Instructions (Signed)
USE LASIX AS NEEDED FOR SWELLING , SHORTNESS OF BREATH OR WEIGHING -TAKE POTASSIUM AS WELL  INCREASE CARVEDILOL 12.5 MG TWICE A DAY  UNTIL BOTTLE COMPLETED TAKE 3 TABLET IS MORNING , AND 4 TABLETS IN THE EVENING ( OLD BOTTLE)  ECHO --Your physician has requested that you have an echocardiogram  AT [place] [address]. Echocardiography is a painless test that uses sound waves to create images of your heart. It provides your doctor with information about the size and shape of your heart and how well your heart's chambers and valves are working. This procedure takes approximately one hour. There are no restrictions for this procedure.   Your physician wants you to follow-up in 6 month with Dr Ellyn Hack. You will receive a reminder letter in the mail two months in advance. If you don't receive a letter, please call our office to schedule the follow-up appointment.

## 2015-11-17 ENCOUNTER — Encounter: Payer: Self-pay | Admitting: Cardiology

## 2015-11-17 NOTE — Assessment & Plan Note (Signed)
Was not at goal by last check. His PCP will be following this up now. He is on 40 mg of Crestor. I would like to know what the results was last studied were, may need to consider Zetia.

## 2015-11-17 NOTE — Assessment & Plan Note (Signed)
He is now far enough out from his MI to recheck an echocardiogram is reevaluate his EF recovery. He is on an unusual dose of carvedilol. I'll titrate him up to 12 twice a day. After we reassess in the fall, I would consider starting losartan for afterload reduction. Need to know his renal function at that time - he has had some prior issues with orthostatic dizziness, therefore I'm reluctant to push too hard.   PLAN: Increased carvedilol 12.5 twice a day and recheck echocardiogram.

## 2015-11-17 NOTE — Assessment & Plan Note (Signed)
Stable. Now on carvedilol. Increase in the 12.5 twice a day.

## 2015-11-17 NOTE — Assessment & Plan Note (Signed)
Successful abstinence since his MI. I congratulated him on his efforts

## 2015-11-17 NOTE — Assessment & Plan Note (Signed)
Extensive LAD PCI with diffuse disease in the RCA but not adequate for PCI. Further anginal symptoms. On Plavix and back on aspirin. On stable dose of Crestor with carvedilol.

## 2015-11-17 NOTE — Assessment & Plan Note (Signed)
Now probably more like class I-II symptoms. He is gradually starting to back to normal baseline level of activity. Lasix was stopped as a standing medicine, however I think is fine from a continue using on an as-needed basis for edema or weight gain based on sliding scale. Gradually titrating up beta blocker. Once we know his blood pressure stable, can consider adding ARB. This will all depend on repeat echo.

## 2015-12-04 HISTORY — PX: TRANSTHORACIC ECHOCARDIOGRAM: SHX275

## 2015-12-05 ENCOUNTER — Other Ambulatory Visit: Payer: Self-pay

## 2015-12-05 ENCOUNTER — Ambulatory Visit (HOSPITAL_COMMUNITY): Payer: Medicare Other | Attending: Internal Medicine

## 2015-12-05 DIAGNOSIS — I255 Ischemic cardiomyopathy: Secondary | ICD-10-CM | POA: Insufficient documentation

## 2015-12-05 DIAGNOSIS — R7303 Prediabetes: Secondary | ICD-10-CM | POA: Diagnosis not present

## 2015-12-05 DIAGNOSIS — I071 Rheumatic tricuspid insufficiency: Secondary | ICD-10-CM | POA: Diagnosis not present

## 2015-12-05 DIAGNOSIS — I340 Nonrheumatic mitral (valve) insufficiency: Secondary | ICD-10-CM | POA: Insufficient documentation

## 2015-12-05 DIAGNOSIS — Z87891 Personal history of nicotine dependence: Secondary | ICD-10-CM | POA: Diagnosis not present

## 2015-12-05 DIAGNOSIS — I2510 Atherosclerotic heart disease of native coronary artery without angina pectoris: Secondary | ICD-10-CM | POA: Diagnosis not present

## 2015-12-05 DIAGNOSIS — E785 Hyperlipidemia, unspecified: Secondary | ICD-10-CM | POA: Insufficient documentation

## 2015-12-05 DIAGNOSIS — I422 Other hypertrophic cardiomyopathy: Secondary | ICD-10-CM | POA: Diagnosis not present

## 2015-12-05 DIAGNOSIS — I6529 Occlusion and stenosis of unspecified carotid artery: Secondary | ICD-10-CM | POA: Diagnosis not present

## 2015-12-05 DIAGNOSIS — I119 Hypertensive heart disease without heart failure: Secondary | ICD-10-CM | POA: Diagnosis not present

## 2015-12-08 DIAGNOSIS — N289 Disorder of kidney and ureter, unspecified: Secondary | ICD-10-CM | POA: Diagnosis not present

## 2016-01-05 DIAGNOSIS — D3A8 Other benign neuroendocrine tumors: Secondary | ICD-10-CM | POA: Diagnosis not present

## 2016-01-05 DIAGNOSIS — K219 Gastro-esophageal reflux disease without esophagitis: Secondary | ICD-10-CM | POA: Diagnosis not present

## 2016-01-25 DIAGNOSIS — D649 Anemia, unspecified: Secondary | ICD-10-CM | POA: Diagnosis not present

## 2016-01-25 DIAGNOSIS — E785 Hyperlipidemia, unspecified: Secondary | ICD-10-CM | POA: Diagnosis not present

## 2016-01-25 DIAGNOSIS — N289 Disorder of kidney and ureter, unspecified: Secondary | ICD-10-CM | POA: Diagnosis not present

## 2016-01-25 DIAGNOSIS — I10 Essential (primary) hypertension: Secondary | ICD-10-CM | POA: Diagnosis not present

## 2016-01-25 DIAGNOSIS — Z8719 Personal history of other diseases of the digestive system: Secondary | ICD-10-CM | POA: Diagnosis not present

## 2016-01-25 DIAGNOSIS — I213 ST elevation (STEMI) myocardial infarction of unspecified site: Secondary | ICD-10-CM | POA: Diagnosis not present

## 2016-01-25 DIAGNOSIS — I639 Cerebral infarction, unspecified: Secondary | ICD-10-CM | POA: Diagnosis not present

## 2016-02-13 DIAGNOSIS — D649 Anemia, unspecified: Secondary | ICD-10-CM | POA: Diagnosis not present

## 2016-02-23 DIAGNOSIS — L309 Dermatitis, unspecified: Secondary | ICD-10-CM | POA: Diagnosis not present

## 2016-02-23 DIAGNOSIS — R609 Edema, unspecified: Secondary | ICD-10-CM | POA: Diagnosis not present

## 2016-02-27 ENCOUNTER — Ambulatory Visit (HOSPITAL_COMMUNITY): Payer: Medicare Other

## 2016-02-27 ENCOUNTER — Ambulatory Visit: Payer: Medicare Other | Admitting: Vascular Surgery

## 2016-04-22 DIAGNOSIS — D3A8 Other benign neuroendocrine tumors: Secondary | ICD-10-CM | POA: Diagnosis not present

## 2016-04-22 DIAGNOSIS — Z8719 Personal history of other diseases of the digestive system: Secondary | ICD-10-CM | POA: Diagnosis not present

## 2016-05-01 DIAGNOSIS — I213 ST elevation (STEMI) myocardial infarction of unspecified site: Secondary | ICD-10-CM | POA: Diagnosis not present

## 2016-05-01 DIAGNOSIS — Z23 Encounter for immunization: Secondary | ICD-10-CM | POA: Diagnosis not present

## 2016-05-01 DIAGNOSIS — N289 Disorder of kidney and ureter, unspecified: Secondary | ICD-10-CM | POA: Diagnosis not present

## 2016-05-01 DIAGNOSIS — Z8719 Personal history of other diseases of the digestive system: Secondary | ICD-10-CM | POA: Diagnosis not present

## 2016-05-01 DIAGNOSIS — I10 Essential (primary) hypertension: Secondary | ICD-10-CM | POA: Diagnosis not present

## 2016-05-01 DIAGNOSIS — E785 Hyperlipidemia, unspecified: Secondary | ICD-10-CM | POA: Diagnosis not present

## 2016-05-01 DIAGNOSIS — I639 Cerebral infarction, unspecified: Secondary | ICD-10-CM | POA: Diagnosis not present

## 2016-05-01 DIAGNOSIS — D649 Anemia, unspecified: Secondary | ICD-10-CM | POA: Diagnosis not present

## 2016-05-10 ENCOUNTER — Other Ambulatory Visit: Payer: Self-pay | Admitting: Physician Assistant

## 2016-05-14 ENCOUNTER — Ambulatory Visit: Payer: Medicare Other | Admitting: Vascular Surgery

## 2016-05-14 ENCOUNTER — Encounter (HOSPITAL_COMMUNITY): Payer: Medicare Other

## 2016-06-07 ENCOUNTER — Encounter: Payer: Self-pay | Admitting: Vascular Surgery

## 2016-06-10 ENCOUNTER — Ambulatory Visit (HOSPITAL_COMMUNITY)
Admission: RE | Admit: 2016-06-10 | Discharge: 2016-06-10 | Disposition: A | Discharge status: Home / Self Care | Payer: Medicare Other | Source: Ambulatory Visit | Attending: Vascular Surgery | Admitting: Vascular Surgery

## 2016-06-10 DIAGNOSIS — I6523 Occlusion and stenosis of bilateral carotid arteries: Secondary | ICD-10-CM | POA: Insufficient documentation

## 2016-06-10 LAB — VAS US CAROTID
LCCAPSYS: 90 cm/s
LEFT ECA DIAS: 5 cm/s
LEFT VERTEBRAL DIAS: -28 cm/s
Left CCA dist dias: -24 cm/s
Left CCA dist sys: -93 cm/s
Left CCA prox dias: 14 cm/s
Left ICA dist dias: -20 cm/s
Left ICA dist sys: -68 cm/s
Left ICA prox dias: -49 cm/s
Left ICA prox sys: -185 cm/s
RCCADSYS: -91 cm/s
RIGHT CCA MID DIAS: 14 cm/s
RIGHT ECA DIAS: -6 cm/s
RIGHT VERTEBRAL DIAS: -2 cm/s
Right CCA prox dias: 11 cm/s
Right CCA prox sys: 76 cm/s

## 2016-06-11 ENCOUNTER — Encounter: Payer: Self-pay | Admitting: Vascular Surgery

## 2016-06-11 ENCOUNTER — Ambulatory Visit (INDEPENDENT_AMBULATORY_CARE_PROVIDER_SITE_OTHER): Payer: Medicare Other | Admitting: Vascular Surgery

## 2016-06-11 VITALS — BP 111/58 | HR 64 | Temp 97.5°F | Resp 18 | Ht 68.0 in | Wt 190.0 lb

## 2016-06-11 DIAGNOSIS — I6523 Occlusion and stenosis of bilateral carotid arteries: Secondary | ICD-10-CM

## 2016-06-11 NOTE — Progress Notes (Signed)
[facility]  Patient name: Damon Walker MRN: [id number] DOB: 06/23/1946 Sex: male  REASON FOR VISIT: Follow-up  HPI: Damon Walker is a 70 y.o. male who presents for follow-up of his bilateral carotid artery stenosis. He was last seen in June 2016. At that time, he had bilateral 50-69% internal carotid artery stenosis. His right vertebral artery was not visualized on duplex. His left vertebral artery was patent with antegrade flow. The patient was asymptomatic at that time without any TIA or stroke symptoms. At that time, the patient was being worked up for exertional chest/throat pain by Dr. Ellyn Hack.  In October 2016, the patient suffered an MI and underwent PCI and stenting 2. Ace later after being discharged from the hospital, he reported sudden right visual loss. He then returned to the hospital and workup revealed a left occipital lobe infarct. He had no other neurologic logical symptoms at that time.  Today, the patient reports he still has a right peripheral field deficit. He states that it is somewhat better. He denies any  weakness or numbness of his extremities. He denies any expressive aphasia. He denies any chest pain or shortness of breath. He has quit smoking since his MI.   He is currently on aspirin, Plavix and a statin. He states that he is somewhat active around the house and work in the yard.  Past Medical History:  Diagnosis Date  . CAD S/P percutaneous coronary angioplasty 05/11/2015   PCI to Prox-Mid LAD with Overlapping Promus Premier DES 3.0 x 38 & 3.0 x 16 (post-dilated to 3.5 mm)   . Carotid arterial disease ([place]) 06/2014; 01/09/2015   Followed by Dr. Donnetta Hutching of [facility] -- a) Carotid u/s: 40-59% bilat ICA stenosis; b) Mod 50-69% Bilateral R Int Carotid (heteroenous plaque), Normal L Vert A, unable to find R Vert A.   . 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  MRI Brain: Acute L  Occipital Lobe Infarct. Chronic microvascular ischemic changes in the white matter and left pons.  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 Chronic combined systolic and diastolic congestive heart failure, NYHA class 2 ([place]) 05/11/2015 - 05/15/15   a. EF 20-25% with anterior-anteroseptal akinesis (immediately post anterior STEMI).  ; b. 10/10/'16 Echo: EF 35-40% with moderate mid-apical anteroseptal, anterior and apical hypokinesis.  . Chronic combined systolic and diastolic congestive heart failure, NYHA class 2 ([place])   . Coronary artery disease involving native heart 05/11/2015   Cath  three-vessel CAD with 90% prox RCA, 80% mid RCA, 80% OM3, 100% prox LAD occlusion treated with DESx2. RCA and OM residual treated medically for now, however will need PCI if has recurrent CP  . Essential hypertension   . H/O: GI bleed 05/15/2015   s/p Sigmoid Polypectomy 05/04/2015 - prior to STEMI on May 11, 2015; Had GI Bleed following TPA for CVA, while on ASA & Brilinta; Flex Sig - 1. Internal Hemorrhoids, 2. Distal sigmoid polypectomy site with flat [place] status post biopsy and tattoo with 45mL spot over 3 injections 3. Proximal sigmoid polypectomy site seen as well, 4) otherwise normal with no evidence of bleed  . Hyperlipidemia with target LDL less than 70   . Ischemic cardiomyopathy 05/11/2015   EF 40% on cath 05/11/2015 after anterior STEMI, EF 20-25% on echo 05/13/2015;   . Prediabetes Oct 2016   A1C 6.0 in Oct 2016  .  ST elevation myocardial infarction (STEMI) involving left anterior descending (LAD) coronary artery with complication ([place]) [id number]   100% Prox LAD - PCI with overlapping DES x 2  . Tobacco abuse    a. 30 yrs - 1.5 ppd. - Quit 05/11/2015  . Vision abnormalities    Right hemianopsia    Family History  Problem Relation Age of Onset  . Adopted: Yes  . Other      Adopted - unaware of biological parent's histories.  . Other Mother       eczema   . Cancer Mother     SOCIAL HISTORY: Social History  Substance Use Topics  . Smoking status: Former Smoker    Packs/day: 1.00    Years: 30.00    Types: Cigarettes    Quit date: 04/06/2015  . Smokeless tobacco: Not on file     Comment: Quit the day of his STEMI  . Alcohol use No    Allergies  Allergen Reactions  . Latex Itching and Rash    Current Outpatient Prescriptions  Medication Sig Dispense Refill  . aspirin 81 MG tablet Take 81 mg by mouth daily.    . carvedilol (COREG) 12.5 MG tablet Take 1 tablet (12.5 mg total) by mouth 2 (two) times daily. 180 tablet 3  . clopidogrel (PLAVIX) 75 MG tablet Take 1 tablet (75 mg total) by mouth daily. 30 tablet 1  . ferrous sulfate 325 (65 FE) MG tablet Take 325 mg by mouth daily with breakfast.    . furosemide (LASIX) 40 MG tablet Take 1 tablet (40 mg total) by mouth as needed. 30 tablet 3  . furosemide (LASIX) 40 MG tablet Take 1 tablet (40 mg total) by mouth daily as needed. 90 tablet 3  . mirtazapine (REMERON SOL-TAB) 15 MG disintegrating tablet Take 1 tablet (15 mg total) by mouth at bedtime. 30 tablet 1  . nitroGLYCERIN (NITROSTAT) 0.4 MG SL tablet Place 1 tablet (0.4 mg total) under the tongue every 5 (five) minutes x 3 doses as needed for chest pain. 25 tablet 3  . pantoprazole (PROTONIX) 40 MG tablet Take 40 mg by mouth daily.     . potassium chloride SA (KLOR-CON M20) 20 MEQ tablet Take 1 tablet (20 mEq total) by mouth daily. 30 tablet 8  . rosuvastatin (CRESTOR) 40 MG tablet Take 1 tablet (40 mg total) by mouth daily at 6 PM. 90 tablet 3   No current facility-administered medications for this visit.     REVIEW OF SYSTEMS:  [X]  denotes positive finding, [ ]  denotes negative finding Cardiac  Comments:  Chest pain or chest pressure:    Shortness of breath upon exertion:    Short of breath when lying flat:    Irregular heart rhythm:        Vascular    Pain in calf, thigh, or hip brought on by ambulation:     Pain in feet at night that wakes you up from your sleep:     Blood clot in your veins:    Leg swelling:  x       Pulmonary    Oxygen at home:    Productive cough:     Wheezing:         Neurologic    Sudden weakness in arms or legs:     Sudden numbness in arms or legs:     Sudden onset of difficulty speaking or slurred speech:    Temporary loss of vision in one eye:  Problems with dizziness:         Gastrointestinal    Blood in stool:     Vomited blood:         Genitourinary    Burning when urinating:     Blood in urine:        Psychiatric    Major depression:         Hematologic    Bleeding problems:    Problems with blood clotting too easily:        Skin    Rashes or ulcers:        Constitutional    Fever or chills:      PHYSICAL EXAM: Vitals:   06/11/16 1551 06/11/16 1552  BP: 122/60 (!) 111/58  Pulse: 60 64  Resp:  18  Temp:  97.5 F (36.4 C)  TempSrc:  Oral  SpO2:  97%  Weight:  190 lb (86.2 kg)  Height:  5\' 8"  (1.727 m)    GENERAL: The patient is a well-nourished male, in no acute distress. The vital signs are documented above. CARDIAC: There is a regular rate and rhythm. No carotid bruits. VASCULAR: 2+ radial pulses equal and symmetric. PULMONARY: There is good air exchange bilaterally without wheezing or rales. MUSCULOSKELETAL: There are no major deformities or cyanosis. NEUROLOGIC: No focal weakness or paresthesias are detected. Right peripheral vision loss. SKIN: There are no ulcers or rashes noted. PSYCHIATRIC: The patient has a normal affect.  DATA:  Carotid duplex 06/10/2016  Less than 40% right internal carotid artery stenosis 40-59% left internal carotid artery stenosis  Right vertebral artery with no and diastolic flow Left vertebral artery is patent with antegrade flow  CTA neck 05/15/2015 Strongly dominant left vertebral artery. Right vertebral artery is difficult to visualize.  MEDICAL ISSUES: Bilateral asymptomatic  carotid stenosis  The patient suffered a MI and is status post PCI and stenting 2 in October 2016. A few days later, after being discharged from the hospital, he reported having visual loss in his right eye. He was found to have a left occipital lobe stroke. The patient continues to have a right visual field defect. His cardiac issues are currently stable. He is going to see Dr. Ellyn Hack soon. He denies any other neurological symptoms. His current left internal carotid stenosis level is below the threshold for surgery. He will follow-up in 1 year with repeat carotid duplex.   Virgina Jock, PA-C [facility]    I have examined the patient, reviewed and agree with above. Eventful year as noted above. No symptoms related to his carotid anterior circulation. We'll continue with yearly duplex surveillance  Curt Jews, MD 06/11/2016 4:50 PM

## 2016-06-22 IMAGING — CR DG CHEST 1V PORT
1 series · 1 of 1 positions shown · non-contrast
Comparison: Single view of the chest 05/11/2015.

CLINICAL DATA: Acute onset severe shortness of breath this morning.
Smoker with a history of coronary artery disease. Initial encounter.

EXAM:
PORTABLE CHEST 1 VIEW

[AP]
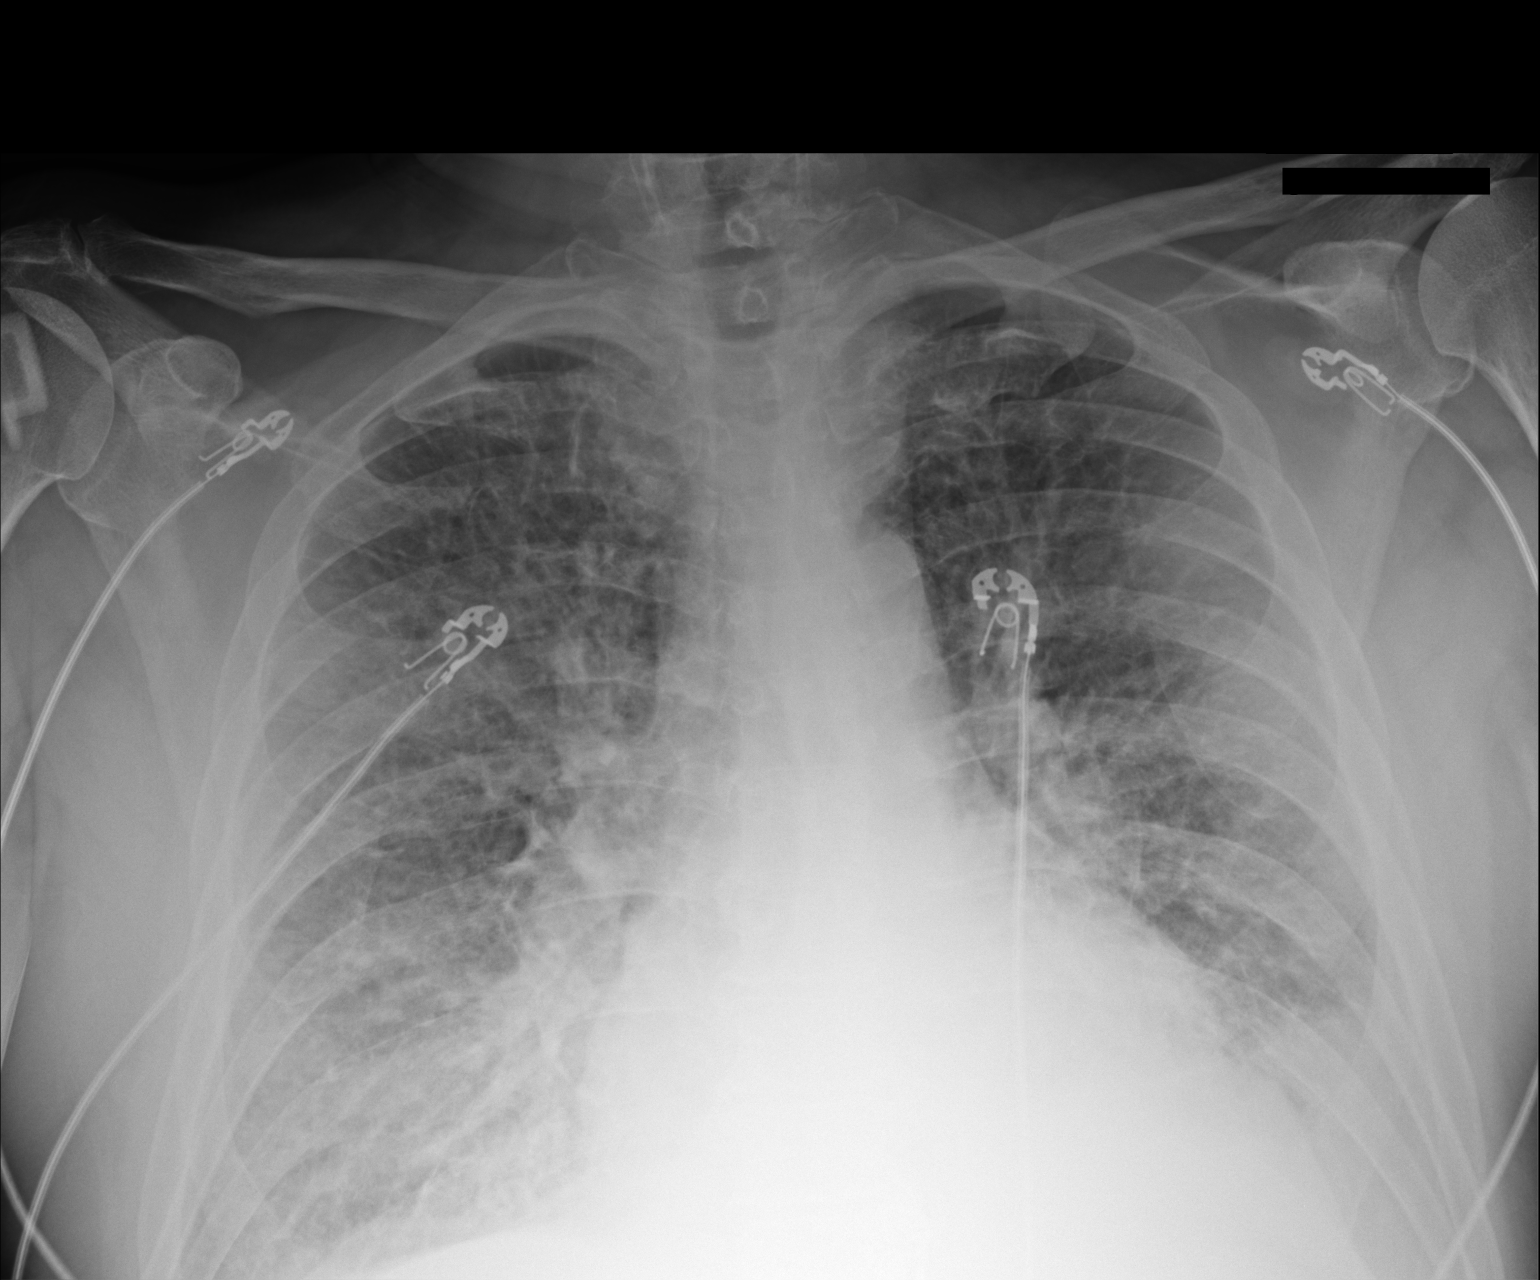

[1 of 1 positions shown; findings below may reference images not displayed]

FINDINGS: There has been marked worsening of bilateral airspace disease that
appears most compatible pulmonary edema. Left pleural effusion is
noted. Heart size is mildly enlarged.
IMPRESSION: Marked worsening of bilateral airspace disease most consistent with
pulmonary edema. Associated left effusion is noted.

## 2016-06-26 NOTE — Addendum Note (Signed)
Addended by: Lianne Cure A on: 06/26/2016 10:05 AM   Modules accepted: Orders

## 2016-07-08 ENCOUNTER — Encounter (HOSPITAL_COMMUNITY): Payer: Self-pay | Admitting: *Deleted

## 2016-07-08 ENCOUNTER — Emergency Department (HOSPITAL_COMMUNITY): Payer: Medicare Other

## 2016-07-08 ENCOUNTER — Observation Stay (HOSPITAL_BASED_OUTPATIENT_CLINIC_OR_DEPARTMENT_OTHER): Payer: Medicare Other

## 2016-07-08 ENCOUNTER — Other Ambulatory Visit: Payer: Self-pay

## 2016-07-08 ENCOUNTER — Observation Stay (HOSPITAL_COMMUNITY)
Admission: EM | Admit: 2016-07-08 | Discharge: 2016-07-09 | Disposition: A | Discharge status: Home / Self Care | Payer: Medicare Other | Attending: Internal Medicine | Admitting: Internal Medicine

## 2016-07-08 DIAGNOSIS — J9601 Acute respiratory failure with hypoxia: Secondary | ICD-10-CM | POA: Diagnosis not present

## 2016-07-08 DIAGNOSIS — Z7902 Long term (current) use of antithrombotics/antiplatelets: Secondary | ICD-10-CM | POA: Insufficient documentation

## 2016-07-08 DIAGNOSIS — I509 Heart failure, unspecified: Secondary | ICD-10-CM

## 2016-07-08 DIAGNOSIS — N183 Chronic kidney disease, stage 3 unspecified: Secondary | ICD-10-CM | POA: Diagnosis present

## 2016-07-08 DIAGNOSIS — I160 Hypertensive urgency: Secondary | ICD-10-CM | POA: Diagnosis not present

## 2016-07-08 DIAGNOSIS — R269 Unspecified abnormalities of gait and mobility: Secondary | ICD-10-CM | POA: Diagnosis not present

## 2016-07-08 DIAGNOSIS — I5043 Acute on chronic combined systolic (congestive) and diastolic (congestive) heart failure: Secondary | ICD-10-CM | POA: Diagnosis present

## 2016-07-08 DIAGNOSIS — I5023 Acute on chronic systolic (congestive) heart failure: Secondary | ICD-10-CM

## 2016-07-08 DIAGNOSIS — K219 Gastro-esophageal reflux disease without esophagitis: Secondary | ICD-10-CM | POA: Diagnosis not present

## 2016-07-08 DIAGNOSIS — I5042 Chronic combined systolic (congestive) and diastolic (congestive) heart failure: Secondary | ICD-10-CM | POA: Insufficient documentation

## 2016-07-08 DIAGNOSIS — F32A Depression, unspecified: Secondary | ICD-10-CM | POA: Diagnosis present

## 2016-07-08 DIAGNOSIS — I252 Old myocardial infarction: Secondary | ICD-10-CM | POA: Insufficient documentation

## 2016-07-08 DIAGNOSIS — E785 Hyperlipidemia, unspecified: Secondary | ICD-10-CM | POA: Diagnosis present

## 2016-07-08 DIAGNOSIS — F329 Major depressive disorder, single episode, unspecified: Secondary | ICD-10-CM | POA: Insufficient documentation

## 2016-07-08 DIAGNOSIS — Z955 Presence of coronary angioplasty implant and graft: Secondary | ICD-10-CM

## 2016-07-08 DIAGNOSIS — Z79899 Other long term (current) drug therapy: Secondary | ICD-10-CM | POA: Insufficient documentation

## 2016-07-08 DIAGNOSIS — Z9861 Coronary angioplasty status: Secondary | ICD-10-CM

## 2016-07-08 DIAGNOSIS — I255 Ischemic cardiomyopathy: Secondary | ICD-10-CM | POA: Insufficient documentation

## 2016-07-08 DIAGNOSIS — I2510 Atherosclerotic heart disease of native coronary artery without angina pectoris: Secondary | ICD-10-CM | POA: Diagnosis present

## 2016-07-08 DIAGNOSIS — Z7982 Long term (current) use of aspirin: Secondary | ICD-10-CM | POA: Diagnosis not present

## 2016-07-08 DIAGNOSIS — I110 Hypertensive heart disease with heart failure: Secondary | ICD-10-CM | POA: Diagnosis not present

## 2016-07-08 DIAGNOSIS — Z87891 Personal history of nicotine dependence: Secondary | ICD-10-CM | POA: Diagnosis not present

## 2016-07-08 DIAGNOSIS — I69398 Other sequelae of cerebral infarction: Secondary | ICD-10-CM | POA: Diagnosis not present

## 2016-07-08 DIAGNOSIS — R0602 Shortness of breath: Secondary | ICD-10-CM | POA: Diagnosis not present

## 2016-07-08 DIAGNOSIS — I130 Hypertensive heart and chronic kidney disease with heart failure and stage 1 through stage 4 chronic kidney disease, or unspecified chronic kidney disease: Secondary | ICD-10-CM | POA: Insufficient documentation

## 2016-07-08 DIAGNOSIS — I10 Essential (primary) hypertension: Secondary | ICD-10-CM | POA: Diagnosis present

## 2016-07-08 LAB — COMPREHENSIVE METABOLIC PANEL
ALT: 24 U/L (ref 17–63)
AST: 27 U/L (ref 15–41)
Albumin: 3.7 g/dL (ref 3.5–5.0)
Alkaline Phosphatase: 49 U/L (ref 38–126)
Anion gap: 7 (ref 5–15)
BUN: 23 mg/dL — AB (ref 6–20)
CHLORIDE: 102 mmol/L (ref 101–111)
CO2: 28 mmol/L (ref 22–32)
CREATININE: 1.57 mg/dL — AB (ref 0.61–1.24)
Calcium: 8.9 mg/dL (ref 8.9–10.3)
GFR, EST AFRICAN AMERICAN: 49 mL/min — AB (ref >60)
GFR, EST NON AFRICAN AMERICAN: 42 mL/min — AB (ref >60)
Glucose, Bld: 181 mg/dL — ABNORMAL HIGH (ref 65–99)
POTASSIUM: 4.3 mmol/L (ref 3.5–5.1)
SODIUM: 137 mmol/L (ref 135–145)
Total Bilirubin: 0.9 mg/dL (ref 0.3–1.2)
Total Protein: 7.3 g/dL (ref 6.5–8.1)

## 2016-07-08 LAB — LIPID PANEL
Cholesterol: 109 mg/dL (ref 0–200)
HDL: 39 mg/dL — AB (ref >40)
LDL CALC: 54 mg/dL (ref 0–99)
Total CHOL/HDL Ratio: 2.8 RATIO
Triglycerides: 78 mg/dL (ref <150)
VLDL: 16 mg/dL (ref 0–40)

## 2016-07-08 LAB — CBC WITH DIFFERENTIAL/PLATELET
BASOS ABS: 0.1 10*3/uL (ref 0.0–0.1)
BASOS PCT: 1 %
Eosinophils Absolute: 0.5 10*3/uL (ref 0.0–0.7)
Eosinophils Relative: 5 %
HCT: 39.8 % (ref 39.0–52.0)
Hemoglobin: 12.8 g/dL — ABNORMAL LOW (ref 13.0–17.0)
LYMPHS ABS: 2.8 10*3/uL (ref 0.7–4.0)
LYMPHS PCT: 26 %
MCH: 29.7 pg (ref 26.0–34.0)
MCHC: 32.2 g/dL (ref 30.0–36.0)
MCV: 92.3 fL (ref 78.0–100.0)
MONOS PCT: 6 %
Monocytes Absolute: 0.7 10*3/uL (ref 0.1–1.0)
NEUTROS ABS: 6.7 10*3/uL (ref 1.7–7.7)
NEUTROS PCT: 62 %
PLATELETS: 248 10*3/uL (ref 150–400)
RBC: 4.31 MIL/uL (ref 4.22–5.81)
RDW: 13.3 % (ref 11.5–15.5)
WBC: 10.8 10*3/uL — ABNORMAL HIGH (ref 4.0–10.5)

## 2016-07-08 LAB — ECHOCARDIOGRAM COMPLETE
HEIGHTINCHES: 68 in
WEIGHTICAEL: 3009.6 [oz_av]

## 2016-07-08 LAB — I-STAT TROPONIN, ED: TROPONIN I, POC: 0.02 ng/mL (ref 0.00–0.08)

## 2016-07-08 LAB — TROPONIN I
TROPONIN I: 0.05 ng/mL — AB (ref <0.03)
TROPONIN I: 0.05 ng/mL — AB (ref <0.03)
TROPONIN I: 0.06 ng/mL — AB (ref <0.03)

## 2016-07-08 LAB — BRAIN NATRIURETIC PEPTIDE: B Natriuretic Peptide: 623.3 pg/mL — ABNORMAL HIGH (ref 0.0–100.0)

## 2016-07-08 MED ORDER — NITROGLYCERIN 2 % TD OINT
1.0000 [in_us] | TOPICAL_OINTMENT | Freq: Once | TRANSDERMAL | Status: AC
  Administered 2016-07-08: 1 [in_us] via TOPICAL
  Filled 2016-07-08: qty 1

## 2016-07-08 MED ORDER — FERROUS SULFATE 325 (65 FE) MG PO TABS
325.0000 mg | ORAL_TABLET | Freq: Every day | ORAL | Status: DC
  Administered 2016-07-08 – 2016-07-09 (×2): 325 mg via ORAL
  Filled 2016-07-08 (×2): qty 1

## 2016-07-08 MED ORDER — CLOPIDOGREL BISULFATE 75 MG PO TABS
75.0000 mg | ORAL_TABLET | Freq: Every day | ORAL | Status: DC
  Administered 2016-07-08 – 2016-07-09 (×2): 75 mg via ORAL
  Filled 2016-07-08 (×2): qty 1

## 2016-07-08 MED ORDER — ALPRAZOLAM 0.25 MG PO TABS: 0.2500 mg | ORAL_TABLET | Freq: Two times a day (BID) | ORAL | Status: DC | PRN

## 2016-07-08 MED ORDER — MIRTAZAPINE 15 MG PO TBDP
15.0000 mg | ORAL_TABLET | Freq: Every day | ORAL | Status: DC
  Administered 2016-07-08: 15 mg via ORAL
  Filled 2016-07-08: qty 1

## 2016-07-08 MED ORDER — SODIUM CHLORIDE 0.9% FLUSH
3.0000 mL | Freq: Two times a day (BID) | INTRAVENOUS | Status: DC
  Administered 2016-07-08 (×2): 3 mL via INTRAVENOUS

## 2016-07-08 MED ORDER — ONDANSETRON HCL 4 MG/2ML IJ SOLN: 4.0000 mg | Freq: Four times a day (QID) | INTRAMUSCULAR | Status: DC | PRN

## 2016-07-08 MED ORDER — ASPIRIN 81 MG PO CHEW
81.0000 mg | CHEWABLE_TABLET | Freq: Every day | ORAL | Status: DC
  Administered 2016-07-08 – 2016-07-09 (×2): 81 mg via ORAL
  Filled 2016-07-08 (×2): qty 1

## 2016-07-08 MED ORDER — PANTOPRAZOLE SODIUM 40 MG PO TBEC
40.0000 mg | DELAYED_RELEASE_TABLET | Freq: Every day | ORAL | Status: DC
  Administered 2016-07-08 – 2016-07-09 (×2): 40 mg via ORAL
  Filled 2016-07-08 (×2): qty 1

## 2016-07-08 MED ORDER — ENOXAPARIN SODIUM 40 MG/0.4ML ~~LOC~~ SOLN
40.0000 mg | SUBCUTANEOUS | Status: DC
  Administered 2016-07-08: 40 mg via SUBCUTANEOUS
  Filled 2016-07-08: qty 0.4

## 2016-07-08 MED ORDER — ROSUVASTATIN CALCIUM 40 MG PO TABS
40.0000 mg | ORAL_TABLET | Freq: Every day | ORAL | Status: DC
  Administered 2016-07-08: 40 mg via ORAL
  Filled 2016-07-08: qty 1

## 2016-07-08 MED ORDER — FUROSEMIDE 20 MG PO TABS
20.0000 mg | ORAL_TABLET | Freq: Every day | ORAL | Status: DC
  Administered 2016-07-08 – 2016-07-09 (×2): 20 mg via ORAL
  Filled 2016-07-08 (×2): qty 1

## 2016-07-08 MED ORDER — ASPIRIN 81 MG PO CHEW
324.0000 mg | CHEWABLE_TABLET | Freq: Once | ORAL | Status: AC
  Administered 2016-07-08: 324 mg via ORAL
  Filled 2016-07-08: qty 4

## 2016-07-08 MED ORDER — FUROSEMIDE 10 MG/ML IJ SOLN
40.0000 mg | Freq: Once | INTRAMUSCULAR | Status: AC
  Administered 2016-07-08: 40 mg via INTRAVENOUS
  Filled 2016-07-08: qty 4

## 2016-07-08 MED ORDER — SODIUM CHLORIDE 0.9 % IV SOLN: 250.0000 mL | INTRAVENOUS | Status: DC | PRN

## 2016-07-08 MED ORDER — ACETAMINOPHEN 325 MG PO TABS: 650.0000 mg | ORAL_TABLET | ORAL | Status: DC | PRN

## 2016-07-08 MED ORDER — SODIUM CHLORIDE 0.9% FLUSH: 3.0000 mL | INTRAVENOUS | Status: DC | PRN

## 2016-07-08 MED ORDER — FUROSEMIDE 10 MG/ML IJ SOLN: 40.0000 mg | Freq: Every day | INTRAMUSCULAR | Status: DC

## 2016-07-08 MED ORDER — NITROGLYCERIN 0.4 MG SL SUBL: 0.4000 mg | SUBLINGUAL_TABLET | SUBLINGUAL | Status: DC | PRN

## 2016-07-08 MED ORDER — CARVEDILOL 12.5 MG PO TABS
12.5000 mg | ORAL_TABLET | Freq: Two times a day (BID) | ORAL | Status: DC
  Administered 2016-07-08 – 2016-07-09 (×2): 12.5 mg via ORAL
  Filled 2016-07-08 (×3): qty 1

## 2016-07-08 MED ORDER — ZOLPIDEM TARTRATE 5 MG PO TABS: 5.0000 mg | ORAL_TABLET | Freq: Every evening | ORAL | Status: DC | PRN

## 2016-07-08 NOTE — H&P (Addendum)
History and Physical    Damon Walker [id number] DOB: 24-Feb-1946 DOA: 07/08/2016  Referring MD/NP/PA:   PCP: [name], MD   Patient coming from:  The patient is coming from home.  At baseline, pt is independent for most of ADL.   Chief Complaint: Shortness of breath  HPI: Damon Walker is a 70 y.o. male with medical history significant of combined systolic and diastolic CHF with EF of 123XX123 percent, CAD, STEMI, s/p of stent, hypertension, hyperlipidemia, GERD, depression, carotid artery stenosis, CVA, CKD-III, who presents with shortness of breath.  Patient stated that he suddenly started having shortness breath in the midnight. He could barely speak in full sentences. He was diaphoretic. No chest pain, cough, fever or chills. Patient states that he has been taking Lasix when necessary. He gain 3 pounds of body weight recently. He denies nausea, vomiting, abdominal pain, diarrhea, symptoms of a UTI or unilateral weakness. Per report, his left hand was cool and had delayed capillary refill in ED. When I saw pt in ED, his both hands are warm and has good normal capillary refill. He had elevated blood pressure 231/86 which improved to 109/48 after placed NTG patch and he feels better. Pt states that he never had CP when he had heart attack in the past.  ED Course: pt was found to have BNP 623.3, negative troponin, WBC 10.8, slightly worsened renal function, temperature normal, no tachycardia, as tachypnea, and saturation currently 96% on room air. Chest x-ray showed interstitial edema. Patient is placed on telemetry bed observation.  Review of Systems:   General: no fevers, chills, has body weight gain, has fatigue HEENT: no blurry vision, hearing changes or sore throat Respiratory: has dyspnea, no coughing, wheezing CV: no chest pain, no palpitations GI: no nausea, vomiting, abdominal pain, diarrhea, constipation GU: no dysuria, burning on urination, increased urinary frequency,  hematuria  Ext: has trace leg edema Neuro: no unilateral weakness, numbness, or tingling, no vision change or hearing loss Skin: no rash, no skin tear. MSK: No muscle spasm, no deformity, no limitation of range of movement in spin Heme: No easy bruising.  Travel history: No recent long distant travel.  Allergy:  Allergies  Allergen Reactions  . Latex Itching and Rash    Past Medical History:  Diagnosis Date  . CAD S/P percutaneous coronary angioplasty 05/11/2015   PCI to Prox-Mid LAD with Overlapping Promus Premier DES 3.0 x 38 & 3.0 x 16 (post-dilated to 3.5 mm)   . Carotid arterial disease ([place]) 06/2014; 01/09/2015   Followed by Dr. Donnetta Hutching of [facility] -- a) Carotid u/s: 40-59% bilat ICA stenosis; b) Mod 50-69% Bilateral R Int Carotid (heteroenous plaque), Normal L Vert A, unable to find R Vert A.   . 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 ([place]) 05/14/2015   -- Given TPA.  MRI Brain: Acute L Occipital Lobe Infarct. Chronic microvascular ischemic changes in the white matter and left pons.  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 Chronic combined systolic and diastolic congestive heart failure, NYHA class 2 ([place]) 05/11/2015 - 05/15/15   a. EF 20-25% with anterior-anteroseptal akinesis (immediately post anterior STEMI).  ; b. 10/10/'16 Echo: EF 35-40% with moderate mid-apical anteroseptal, anterior and apical hypokinesis.  . Chronic combined systolic and diastolic congestive heart failure, NYHA class 2 ([place])   . Coronary artery disease involving native heart 05/11/2015   Cath  three-vessel CAD with 90% prox RCA, 80% mid RCA,  80% OM3, 100% prox LAD occlusion treated with DESx2. RCA and OM residual treated medically for now, however will need PCI if has recurrent CP  . Essential hypertension   . H/O: GI bleed 05/15/2015   s/p Sigmoid Polypectomy 05/04/2015 - prior to STEMI on May 11, 2015; Had GI Bleed following TPA for CVA,  while on ASA & Brilinta; Flex Sig - 1. Internal Hemorrhoids, 2. Distal sigmoid polypectomy site with flat Whitebase status post biopsy and tattoo with 44mL spot over 3 injections 3. Proximal sigmoid polypectomy site seen as well, 4) otherwise normal with no evidence of bleed  . Hyperlipidemia with target LDL less than 70   . Ischemic cardiomyopathy 05/11/2015   EF 40% on cath 05/11/2015 after anterior STEMI, EF 20-25% on echo 05/13/2015;   . Prediabetes Oct 2016   A1C 6.0 in Oct 2016  . ST elevation myocardial infarction (STEMI) involving left anterior descending (LAD) coronary artery with complication ([place]) [id number]   100% Prox LAD - PCI with overlapping DES x 2  . Tobacco abuse    a. 30 yrs - 1.5 ppd. - Quit 05/11/2015  . Vision abnormalities    Right hemianopsia    Past Surgical History:  Procedure Laterality Date  . CARDIAC CATHETERIZATION N/A 05/11/2015   Procedure: Left Heart Cath and Coronary Angiography;  Surgeon: Peter M Martinique, MD;  Location: [facility];  Service: Cardiovascular;  100% pLAD (long lesion). RCA - prox 90%, mid 80%. OM2 & OM3 80% (OM2 & 3 not necessarily PCI targets); EF 35-45% with Anterio HK  . CARDIAC CATHETERIZATION N/A 05/11/2015   Procedure: Coronary Stent Intervention;  Surgeon: Peter M Martinique, MD;  Location: [facility];  Service: Cardiovascular; PCI to Prox-Mid LAD with Overlapping Promus Premier DES 3.0 x 38 & 3.0 x 16 (post-dilated to 3.5 mm)    . FLEXIBLE SIGMOIDOSCOPY N/A 05/18/2015   Procedure: FLEXIBLE SIGMOIDOSCOPY;  Surgeon: Clarene Essex, MD;  [facility];  Service: Endoscopy;  Laterality: N/A;  . S/P Appendectomy     Age 25  . S/P Inguinal Hernia Repair     In his 20's.  . TRANSTHORACIC ECHOCARDIOGRAM  05/13/2015   Mlid LVH. EF 20-25% - Akinesis of mid-Apical Anterior, basal-mid AnteroSeptal, Mid Anterolateral, apical septal, apical-lateral & apical  walls.  Abnormal Diastolic function - indeterminate  . TRANSTHORACIC  ECHOCARDIOGRAM  05/15/2015   Mild LVH, EF 35-40% - Mod HK of mid-apical anteroseptal, anterior & apical walls.  Gr 1 DD. Mild bilateral Atrial Enlargement.    Social History:  reports that he quit smoking about 15 months ago. His smoking use included Cigarettes. He has a 30.00 pack-year smoking history. He has never used smokeless tobacco. He reports that he does not drink alcohol or use drugs.  Family History:  Family History  Problem Relation Age of Onset  . Adopted: Yes  . Other Mother     eczema   . Cancer Mother   . Other      Adopted - unaware of biological parent's histories.     Prior to Admission medications   Medication Sig Start Date End Date Taking? Authorizing Provider  aspirin 81 MG tablet Take 81 mg by mouth daily.   Yes Historical Provider, MD  carvedilol (COREG) 12.5 MG tablet Take 1 tablet (12.5 mg total) by mouth 2 (two) times daily. 11/16/15  Yes Leonie Man, MD  clopidogrel (PLAVIX) 75 MG tablet Take 1 tablet (75 mg total) by mouth daily. 05/26/15  Yes Ivan Anchors Love, PA-C  ferrous sulfate 325 (65 FE) MG tablet Take 325 mg by mouth daily with breakfast.   Yes Historical Provider, MD  furosemide (LASIX) 40 MG tablet Take 1 tablet (40 mg total) by mouth as needed. 11/16/15  Yes Leonie Man, MD  mirtazapine (REMERON SOL-TAB) 15 MG disintegrating tablet Take 1 tablet (15 mg total) by mouth at bedtime. 05/26/15  Yes Ivan Anchors Love, PA-C  nitroGLYCERIN (NITROSTAT) 0.4 MG SL tablet Place 1 tablet (0.4 mg total) under the tongue every 5 (five) minutes x 3 doses as needed for chest pain. 05/14/15  Yes Almyra Deforest, PA  pantoprazole (PROTONIX) 40 MG tablet Take 40 mg by mouth daily.    Yes Historical Provider, MD  potassium chloride SA (KLOR-CON M20) 20 MEQ tablet Take 1 tablet (20 mEq total) by mouth daily. 11/16/15  Yes Leonie Man, MD  rosuvastatin (CRESTOR) 40 MG tablet Take 1 tablet (40 mg total) by mouth daily at 6 PM. 05/14/15  Yes Almyra Deforest, PA  furosemide (LASIX) 40  MG tablet Take 1 tablet (40 mg total) by mouth daily as needed. Patient not taking: Reported on 07/08/2016 05/10/16   Leonie Man, MD    Physical Exam: Vitals:   07/08/16 0330 07/08/16 0345 07/08/16 0430 07/08/16 0445  BP: 124/62 (!) 109/48 (!) 99/52 115/62  Pulse: 83 70 66 69  Resp: 25 25 15 20   Temp:      TempSrc:      SpO2: 100% 96% 95% 95%  Weight:      Height:       General: Not in acute distress HEENT:       Eyes: PERRL, EOMI, no scleral icterus.       ENT: No discharge from the ears and nose, no pharynx injection, no tonsillar enlargement.        Neck: No JVD, no bruit, no mass felt. Heme: No neck lymph node enlargement. Cardiac: S1/S2, RRR, No murmurs, No gallops or rubs. Respiratory: has rales, no wheezing, rhonchi or rubs. GI: Soft, nondistended, nontender, no rebound pain, no organomegaly, BS present. GU: No hematuria Ext: has trace leg edema bilaterally. 2+DP/PT pulse bilaterally. Musculoskeletal: No joint deformities, No joint redness or warmth, no limitation of ROM in spin. Skin: No rashes.  Neuro: Alert, oriented X3, cranial nerves II-XII grossly intact, moves all extremities normally. Psych: Patient is not psychotic, no suicidal or hemocidal ideation.  Labs on Admission: I have personally reviewed following labs and imaging studies  CBC:  Recent Labs Lab 07/08/16 0237  WBC 10.8*  NEUTROABS 6.7  HGB 12.8*  HCT 39.8  MCV 92.3  PLT [id number]   Basic Metabolic Panel:  Recent Labs Lab 07/08/16 0237  NA 137  K 4.3  CL 102  CO2 28  GLUCOSE 181*  BUN 23*  CREATININE 1.57*  CALCIUM 8.9   GFR: Estimated Creatinine Clearance: 45 mL/min (by C-G formula based on SCr of 1.57 mg/dL (H)). Liver Function Tests:  Recent Labs Lab 07/08/16 0237  AST 27  ALT 24  ALKPHOS 49  BILITOT 0.9  PROT 7.3  ALBUMIN 3.7   No results for input(s): LIPASE, AMYLASE in the last 168 hours. No results for input(s): AMMONIA in the last 168 hours. Coagulation  Profile: No results for input(s): INR, PROTIME in the last 168 hours. Cardiac Enzymes: No results for input(s): CKTOTAL, CKMB, CKMBINDEX, TROPONINI in the last 168 hours. BNP (last 3 results) No results for input(s): PROBNP in the last  8760 hours. HbA1C: No results for input(s): HGBA1C in the last 72 hours. CBG: No results for input(s): GLUCAP in the last 168 hours. Lipid Profile: No results for input(s): CHOL, HDL, LDLCALC, TRIG, CHOLHDL, LDLDIRECT in the last 72 hours. Thyroid Function Tests: No results for input(s): TSH, T4TOTAL, FREET4, T3FREE, THYROIDAB in the last 72 hours. Anemia Panel: No results for input(s): VITAMINB12, FOLATE, FERRITIN, TIBC, IRON, RETICCTPCT in the last 72 hours. Urine analysis:    Component Value Date/Time   COLORURINE YELLOW 06/20/2015 0940   APPEARANCEUR CLEAR 06/20/2015 0940   LABSPEC 1.007 06/20/2015 0940   PHURINE 5.0 06/20/2015 0940   GLUCOSEU NEGATIVE 06/20/2015 0940   HGBUR NEGATIVE 06/20/2015 0940   BILIRUBINUR NEGATIVE 06/20/2015 0940   KETONESUR NEGATIVE 06/20/2015 0940   PROTEINUR NEGATIVE 06/20/2015 0940   UROBILINOGEN 0.2 06/20/2015 0940   NITRITE NEGATIVE 06/20/2015 0940   LEUKOCYTESUR NEGATIVE 06/20/2015 0940   Sepsis Labs: @LABRCNTIP (procalcitonin:4,lacticidven:4) )No results found for this or any previous visit (from the past 240 hour(s)).   Radiological Exams on Admission: Dg Chest Portable 1 View  Result Date: 07/08/2016 CLINICAL DATA:  Acute onset of shortness of breath. Initial encounter. EXAM: PORTABLE CHEST 1 VIEW COMPARISON:  Chest radiograph performed 06/20/2015 FINDINGS: The lungs are well-aerated. Vascular congestion is noted. Increased interstitial markings raise concern for mild interstitial edema. There is no evidence of pleural effusion or pneumothorax. The cardiomediastinal silhouette is borderline normal in size. No acute osseous abnormalities are seen. IMPRESSION: Vascular congestion noted. Increased interstitial  markings raise concern for mild interstitial edema. Electronically Signed   By: Garald Balding M.D.   On: 07/08/2016 02:41     EKG: Independently reviewed.  Sinus rhythm, QTC 461, anteroseptal infarction pattern, mild ST depression in V5-V6, mild T-wave inversion in lateral leads.   Assessment/Plan Principal Problem:   Acute respiratory failure with hypoxia (HCC) Active Problems:   Essential hypertension   Hyperlipidemia with target LDL less than 70   Presence of drug coated stent in LAD coronary artery   Acute on chronic combined systolic and diastolic congestive heart failure (HCC)   Depression   CAD S/P percutaneous coronary angioplasty   CKD (chronic kidney disease), stage III   CAD (coronary artery disease)   GERD (gastroesophageal reflux disease)   Hypertensive urgency   Acute respiratory failure with hypoxia ([place]):This is likely due to CHF exacerbation given his evlevated BNP and gain of body weight. He may also had flash of pulmonary edema since his blood pressure is significantly elevated and responded to nitroglycerin patch quickly.  -will place on tele bed for obs -Lasix 40 mg daily -trop x 3 -2d echo -Risk factor stratification: A1c, FLP -will continue home coreg, ASA -prn NTG -Daily weights -strict I/O's -Low salt diet -No ACEI due to CKD-III  Acute on chronic combined systolic and diastolic congestive heart failure ([place]): 2-D echo on 5 since 2/17 showed EF 40-45 percent with grade 2 diastolic dysfunction. Now presents with CHF exacerbation. -See above   Hypertensive urgency: Initial blood pressure 213/86, which improved to 109/48 after treated with nitroglycerin patch in ED-->bp has lowered too fast. Pt denies taking phosphodiesterase inhibitor.  -will bp closely for hypoperfusion symptoms. -Continue home Coreg -on IV lasix (patient received 1 dose of Lasix in the ED, will not give more tonight).  HLD: Last LDL was 6.2 on 05/14/16 -Continue home  medications: Crestor  CAD and hx of STEMI: s/p of drug coated stent.  No chest pain. -continue ASA, Plavix, Coreg, Crestor -Follow-up  troponin 3  CKD-III: Slightly worsening renal function. Baseline creatinine 1.3-1.4. His prednisone 1.57 on admission. -Follow-up renal function by BMP  GERD: -Protonix  Depression: Stable, no suicidal or homicidal ideations. -Continue home medications: Remeron  DVT ppx: SQ Lovenox Code Status: Full code Family Communication: Yes, patient's wife at bed side Disposition Plan:  Anticipate discharge back to previous home environment Consults called: none   Admission status: Obs / tele   Date of Service 07/08/2016    Ivor Costa [facility] Pager [phone]  If 7PM-7AM, please contact night-coverage [email or web address] Password [email or web address] 07/08/2016, 5:11 AM

## 2016-07-08 NOTE — ED Notes (Signed)
Patient took 1 NTG prior to EMS arrival

## 2016-07-08 NOTE — ED Provider Notes (Signed)
[facility] DEPT Provider Note   CSN: [id number] Arrival date & time: 07/08/16  0226  By signing my name below, I, Maud Deed. Royston Sinner, attest that this documentation has been prepared under the direction and in the presence of Merryl Hacker, MD.  Electronically Signed: Maud Deed. Royston Sinner, ED Scribe. 07/08/16. 2:48 AM.    History   Chief Complaint Chief Complaint  Patient presents with  . Shortness of Breath   The history is provided by the patient. No language interpreter was used.    HPI Comments: Damon Walker, brought in by EMS is a 70 y.o. male with a PMHx of CAD, CHF, hyperlipidemia, and prediabetes who presents to the Emergency Department complaining of gradual onset, constant shortness of breath onset 1 hour prior to arrival while resting. No aggravating or alleviating factors reported. Per triage note, EMS stated pt was diaphoretic, blue fingers with capillary refill more than 5 seconds. 1 Nitro attempted prior to EMS arrival without any noticeable improvement. Last blood pressure en route 170/100. Pt states current symptoms feel similar to what he experienced last year when diagnosed with an MI. Pt denies any chest pain at this time but states he also did not have any chest pain at time of last MI. No recent fever, chills, nausea, vomiting, or lower extremity swelling.  PCP: [name], MD   CARDIOLOGIST: Glenetta Hew, MD  Past Medical History:  Diagnosis Date  . CAD S/P percutaneous coronary angioplasty 05/11/2015   PCI to Prox-Mid LAD with Overlapping Promus Premier DES 3.0 x 38 & 3.0 x 16 (post-dilated to 3.5 mm)   . Carotid arterial disease ([place]) 06/2014; 01/09/2015   Followed by Dr. Donnetta Hutching of [facility] -- a) Carotid u/s: 40-59% bilat ICA stenosis; b) Mod 50-69% Bilateral R Int Carotid (heteroenous plaque), Normal L Vert A, unable to find R Vert A.   . 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  MRI Brain: Acute L Occipital  Lobe Infarct. Chronic microvascular ischemic changes in the white matter and left pons.  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 Chronic combined systolic and diastolic congestive heart failure, NYHA class 2 ([place]) 05/11/2015 - 05/15/15   a. EF 20-25% with anterior-anteroseptal akinesis (immediately post anterior STEMI).  ; b. 10/10/'16 Echo: EF 35-40% with moderate mid-apical anteroseptal, anterior and apical hypokinesis.  . Chronic combined systolic and diastolic congestive heart failure, NYHA class 2 ([place])   . Coronary artery disease involving native heart 05/11/2015   Cath  three-vessel CAD with 90% prox RCA, 80% mid RCA, 80% OM3, 100% prox LAD occlusion treated with DESx2. RCA and OM residual treated medically for now, however will need PCI if has recurrent CP  . Essential hypertension   . H/O: GI bleed 05/15/2015   s/p Sigmoid Polypectomy 05/04/2015 - prior to STEMI on May 11, 2015; Had GI Bleed following TPA for CVA, while on ASA & Brilinta; Flex Sig - 1. Internal Hemorrhoids, 2. Distal sigmoid polypectomy site with flat [place] status post biopsy and tattoo with 69mL spot over 3 injections 3. Proximal sigmoid polypectomy site seen as well, 4) otherwise normal with no evidence of bleed  . Hyperlipidemia with target LDL less than 70   . Ischemic cardiomyopathy 05/11/2015   EF 40% on cath 05/11/2015 after anterior STEMI, EF 20-25% on echo 05/13/2015;   . Prediabetes Oct 2016   A1C 6.0 in Oct 2016  . ST  elevation myocardial infarction (STEMI) involving left anterior descending (LAD) coronary artery with complication ([place]) [id number]   100% Prox LAD - PCI with overlapping DES x 2  . Tobacco abuse    a. 30 yrs - 1.5 ppd. - Quit 05/11/2015  . Vision abnormalities    Right hemianopsia    Patient Active Problem List   Diagnosis Date Noted  . CKD (chronic kidney disease), stage III 07/08/2016  . Acute respiratory failure with hypoxia ([place])  07/08/2016  . CAD (coronary artery disease) 07/08/2016  . GERD (gastroesophageal reflux disease) 07/08/2016  . Hypertensive urgency 07/08/2016  . CHF exacerbation ([place]) 07/08/2016  . Acute on chronic systolic congestive heart failure ([place])   . Tobacco use disorder 10/10/2015  . Depression 07/05/2015  . Acute on chronic systolic heart failure ([place]) 06/20/2015  . 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 Right homonymous hemianopsia 05/18/2015  . Gait disturbance, post-stroke 05/18/2015  . Coronary artery disease involving native coronary artery of native heart without angina pectoris 05/16/2015  . ST elevation myocardial infarction (STEMI) of anterior wall, subsequent episode of care ([place]) 05/15/2015  . Lower GI bleed 05/15/2015  . Carotid arterial disease ([place]) 05/11/2015  . Cardiomyopathy, ischemic: EF improved to 35-40% from 20-25% post PCI LAD 05/11/2015  . Chronic combined systolic and diastolic congestive heart failure, NYHA class 2 ([place]) 05/11/2015  . CAD S/P percutaneous coronary angioplasty 05/11/2015    Class: Status post  . Presence of drug coated stent in LAD coronary artery 05/08/2015  . Prediabetes 05/06/2015  . Essential hypertension 07/05/2014  . Hyperlipidemia with target LDL less than 70 07/05/2014  . Tobacco abuse 07/05/2014  . Pre-syncope 06/22/2014    Past Surgical History:  Procedure Laterality Date  . CARDIAC CATHETERIZATION N/A 05/11/2015   Procedure: Left Heart Cath and Coronary Angiography;  Surgeon: Peter M Martinique, MD;  Location: [facility];  Service: Cardiovascular;  100% pLAD (long lesion). RCA - prox 90%, mid 80%. OM2 & OM3 80% (OM2 & 3 not necessarily PCI targets); EF 35-45% with Anterio HK  . CARDIAC CATHETERIZATION N/A 05/11/2015   Procedure: Coronary Stent Intervention;  Surgeon: Peter M Martinique, MD;  Location: [facility];  Service: Cardiovascular; PCI to Prox-Mid LAD with Overlapping Promus Premier  DES 3.0 x 38 & 3.0 x 16 (post-dilated to 3.5 mm)    . FLEXIBLE SIGMOIDOSCOPY N/A 05/18/2015   Procedure: FLEXIBLE SIGMOIDOSCOPY;  Surgeon: Clarene Essex, MD;  [facility];  Service: Endoscopy;  Laterality: N/A;  . S/P Appendectomy     Age 7  . S/P Inguinal Hernia Repair     In his 20's.  . TRANSTHORACIC ECHOCARDIOGRAM  05/13/2015   Mlid LVH. EF 20-25% - Akinesis of mid-Apical Anterior, basal-mid AnteroSeptal, Mid Anterolateral, apical septal, apical-lateral & apical  walls.  Abnormal Diastolic function - indeterminate  . TRANSTHORACIC ECHOCARDIOGRAM  05/15/2015   Mild LVH, EF 35-40% - Mod HK of mid-apical anteroseptal, anterior & apical walls.  Gr 1 DD. Mild bilateral Atrial Enlargement.       Home Medications    Prior to Admission medications   Medication Sig Start Date End Date Taking? Authorizing Provider  aspirin 81 MG tablet Take 81 mg by mouth daily.   Yes Historical Provider, MD  carvedilol (COREG) 12.5 MG tablet Take 1 tablet (12.5 mg total) by mouth 2 (two) times daily. 11/16/15  Yes Leonie Man, MD  clopidogrel (PLAVIX) 75 MG tablet Take 1 tablet (75 mg total)  by mouth daily. 05/26/15  Yes Ivan Anchors Love, PA-C  ferrous sulfate 325 (65 FE) MG tablet Take 325 mg by mouth daily with breakfast.   Yes Historical Provider, MD  furosemide (LASIX) 40 MG tablet Take 1 tablet (40 mg total) by mouth as needed. 11/16/15  Yes Leonie Man, MD  mirtazapine (REMERON SOL-TAB) 15 MG disintegrating tablet Take 1 tablet (15 mg total) by mouth at bedtime. 05/26/15  Yes Ivan Anchors Love, PA-C  nitroGLYCERIN (NITROSTAT) 0.4 MG SL tablet Place 1 tablet (0.4 mg total) under the tongue every 5 (five) minutes x 3 doses as needed for chest pain. 05/14/15  Yes Almyra Deforest, PA  pantoprazole (PROTONIX) 40 MG tablet Take 40 mg by mouth daily.    Yes Historical Provider, MD  potassium chloride SA (KLOR-CON M20) 20 MEQ tablet Take 1 tablet (20 mEq total) by mouth daily. 11/16/15  Yes Leonie Man, MD    rosuvastatin (CRESTOR) 40 MG tablet Take 1 tablet (40 mg total) by mouth daily at 6 PM. 05/14/15  Yes Almyra Deforest, PA  furosemide (LASIX) 40 MG tablet Take 1 tablet (40 mg total) by mouth daily as needed. Patient not taking: Reported on 07/08/2016 05/10/16   Leonie Man, MD    Family History Family History  Problem Relation Age of Onset  . Adopted: Yes  . Other Mother     eczema   . Cancer Mother   . Other      Adopted - unaware of biological parent's histories.    Social History Social History  Substance Use Topics  . Smoking status: Former Smoker    Packs/day: 1.00    Years: 30.00    Types: Cigarettes    Quit date: 04/06/2015  . Smokeless tobacco: Never Used     Comment: Quit the day of his STEMI  . Alcohol use No     Allergies   Latex   Review of Systems Review of Systems  Constitutional: Positive for diaphoresis. Negative for chills and fever.  HENT: Negative for congestion.   Respiratory: Positive for shortness of breath. Negative for cough.   Cardiovascular: Negative for chest pain.  Gastrointestinal: Negative for abdominal pain, nausea and vomiting.  Neurological: Negative for headaches.  Psychiatric/Behavioral: Negative for confusion.  All other systems reviewed and are negative.    Physical Exam Updated Vital Signs BP 115/62   Pulse 69   Temp 97.7 F (36.5 C) (Oral)   Resp 20   Ht 5\' 7"  (1.702 m)   Wt 193 lb 9.6 oz (87.8 kg)   SpO2 95%   BMI 30.32 kg/m   Physical Exam  Constitutional: He is oriented to person, place, and time. No distress.  HENT:  Head: Normocephalic and atraumatic.  Cardiovascular: Normal rate, regular rhythm and normal heart sounds.   No murmur heard. Pulmonary/Chest: Effort normal. No respiratory distress. He has wheezes.  Scant expiratory wheeze, no crackles or rhonchi noted  Abdominal: Soft. Bowel sounds are normal. There is no tenderness. There is no rebound.  Musculoskeletal: He exhibits edema.  Trace bilateral lower  extremity edema  Neurological: He is alert and oriented to person, place, and time.  Skin: Skin is warm and dry.  Psychiatric: He has a normal mood and affect.  Nursing note and vitals reviewed.    ED Treatments / Results   DIAGNOSTIC STUDIES: Oxygen Saturation is 100% on RA, Normal by my interpretation.    COORDINATION OF CARE: 2:47 AM- Will order blood work, EKG, and  CXR. Discussed treatment plan with pt at bedside and pt agreed to plan.     Labs (all labs ordered are listed, but only abnormal results are displayed) Labs Reviewed  CBC WITH DIFFERENTIAL/PLATELET - Abnormal; Notable for the following:       Result Value   WBC 10.8 (*)    Hemoglobin 12.8 (*)    All other components within normal limits  COMPREHENSIVE METABOLIC PANEL - Abnormal; Notable for the following:    Glucose, Bld 181 (*)    BUN 23 (*)    Creatinine, Ser 1.57 (*)    GFR calc non Af Amer 42 (*)    GFR calc Af Amer 49 (*)    All other components within normal limits  BRAIN NATRIURETIC PEPTIDE - Abnormal; Notable for the following:    B Natriuretic Peptide 623.3 (*)    All other components within normal limits  TROPONIN I  TROPONIN I  TROPONIN I  HEMOGLOBIN A1C  LIPID PANEL  I-STAT TROPOININ, ED    EKG  EKG Interpretation  Date/Time:  Monday July 08 2016 02:34:10 EST Ventricular Rate:  86 PR Interval:    QRS Duration: 93 QT Interval:  385 QTC Calculation: 461 R Axis:   -28 Text Interpretation:  Sinus rhythm Borderline prolonged PR interval Borderline left axis deviation Anteroseptal infarct, age indeterminate Borderline repolarization abnormality No significant change since last tracing Confirmed by Zyion Leidner  MD, Lamira Borin ([id number]) on 07/08/2016 2:37:22 AM       Radiology Dg Chest Portable 1 View  Result Date: 07/08/2016 CLINICAL DATA:  Acute onset of shortness of breath. Initial encounter. EXAM: PORTABLE CHEST 1 VIEW COMPARISON:  Chest radiograph performed 06/20/2015 FINDINGS: The lungs  are well-aerated. Vascular congestion is noted. Increased interstitial markings raise concern for mild interstitial edema. There is no evidence of pleural effusion or pneumothorax. The cardiomediastinal silhouette is borderline normal in size. No acute osseous abnormalities are seen. IMPRESSION: Vascular congestion noted. Increased interstitial markings raise concern for mild interstitial edema. Electronically Signed   By: Garald Balding M.D.   On: 07/08/2016 02:41    Procedures Procedures (including critical care time)  Medications Ordered in ED Medications  furosemide (LASIX) injection 40 mg (not administered)  aspirin chewable tablet 81 mg (not administered)  carvedilol (COREG) tablet 12.5 mg (not administered)  ferrous sulfate tablet 325 mg (not administered)  clopidogrel (PLAVIX) tablet 75 mg (not administered)  mirtazapine (REMERON SOL-TAB) disintegrating tablet 15 mg (not administered)  nitroGLYCERIN (NITROSTAT) SL tablet 0.4 mg (not administered)  pantoprazole (PROTONIX) EC tablet 40 mg (not administered)  rosuvastatin (CRESTOR) tablet 40 mg (not administered)  sodium chloride flush (NS) 0.9 % injection 3 mL (not administered)  sodium chloride flush (NS) 0.9 % injection 3 mL (not administered)  0.9 %  sodium chloride infusion (not administered)  acetaminophen (TYLENOL) tablet 650 mg (not administered)  ondansetron (ZOFRAN) injection 4 mg (not administered)  enoxaparin (LOVENOX) injection 40 mg (not administered)  zolpidem (AMBIEN) tablet 5 mg (not administered)  ALPRAZolam (XANAX) tablet 0.25 mg (not administered)  aspirin chewable tablet 324 mg (324 mg Oral Given 07/08/16 0324)  nitroGLYCERIN (NITROGLYN) 2 % ointment 1 inch (0 inches Topical Hold 07/08/16 0428)  furosemide (LASIX) injection 40 mg (40 mg Intravenous Given 07/08/16 0435)     Initial Impression / Assessment and Plan / ED Course  I have reviewed the triage vital signs and the nursing notes.  Pertinent labs &  imaging results that were available during my care of the patient  were reviewed by me and considered in my medical decision making (see chart for details).  Clinical Course as of Jul 08 500  Mon Jul 08, 2016  0405 Currently symptom-free. No longer short of breath. Continues to be chest pain-free. Initial troponin is negative. X-ray does appear mildly volume overloaded. He was given 1 dose of Lasix. Will repeat troponin.  [CH]    Clinical Course User Index [CH] Merryl Hacker, MD    Patient presents with shortness of breath. Denies chest pain.  History of similar symptoms with prior MI and has previously required BiPAP for acute CHF. He's currently nontoxic. No respiratory distress. Oxygen weaned. Mild edema but no overt overload. Chest x-ray does show some pulmonary edema. Patient was given Lasix. Nitroglycerin ointment was applied with precipitous drop in his blood pressure.  On recheck he is asymptomatic. Initial troponin negative. EKG is nonischemic. Lab work is reassuring. Given history of similar symptoms with MI, will admit for serial enzymes and diuresis.  Final Clinical Impressions(s) / ED Diagnoses   Final diagnoses:  Acute on chronic systolic congestive heart failure (HCC)    New Prescriptions New Prescriptions   No medications on file   I personally performed the services described in this documentation, which was scribed in my presence. The recorded information has been reviewed and is accurate.    Merryl Hacker, MD 07/08/16 [phone]

## 2016-07-08 NOTE — Progress Notes (Signed)
Patient admitted after midnight, please see H&P.  Patient was on 40 mg lasix daily but this was removed in favor or PRN dose as kidney function was worsening.  Plan to restart lower dose of lasix.  BP was high in ER, will continue to monitor on tele. -down almost 1 L so far  Eulogio Bear DO

## 2016-07-08 NOTE — ED Notes (Signed)
Patient up to the [facility].  Large dark BM - back to bed.  Nasal cannula applied at 3 liters

## 2016-07-08 NOTE — ED Triage Notes (Signed)
Patient arrived via EMS Call was for difficulty breathing about 1 hour prior to EMS arrival while laying on the couch watching TV.  EMS reported his fingers were blue, he was diaphoretic, cap refill was more than 5 seconds.  Upon arrival to the ED patient was on the  NRB, left hand was cool to touch with slow cap refill, right hand warm to touch with slow cap refill, able to talk in 2-4 word sentences. EMS reported last BP 170/100

## 2016-07-08 NOTE — ED Notes (Signed)
Oxygen removed sats remain 95-97%

## 2016-07-09 DIAGNOSIS — E785 Hyperlipidemia, unspecified: Secondary | ICD-10-CM

## 2016-07-09 DIAGNOSIS — I10 Essential (primary) hypertension: Secondary | ICD-10-CM

## 2016-07-09 DIAGNOSIS — N183 Chronic kidney disease, stage 3 (moderate): Secondary | ICD-10-CM

## 2016-07-09 DIAGNOSIS — J9601 Acute respiratory failure with hypoxia: Secondary | ICD-10-CM | POA: Diagnosis not present

## 2016-07-09 LAB — BASIC METABOLIC PANEL
Anion gap: 7 (ref 5–15)
BUN: 20 mg/dL (ref 6–20)
CO2: 29 mmol/L (ref 22–32)
CREATININE: 1.56 mg/dL — AB (ref 0.61–1.24)
Calcium: 8.9 mg/dL (ref 8.9–10.3)
Chloride: 101 mmol/L (ref 101–111)
GFR, EST AFRICAN AMERICAN: 49 mL/min — AB (ref >60)
GFR, EST NON AFRICAN AMERICAN: 43 mL/min — AB (ref >60)
Glucose, Bld: 91 mg/dL (ref 65–99)
POTASSIUM: 3.5 mmol/L (ref 3.5–5.1)
SODIUM: 137 mmol/L (ref 135–145)

## 2016-07-09 LAB — CBC
HEMATOCRIT: 36.1 % — AB (ref 39.0–52.0)
Hemoglobin: 11.7 g/dL — ABNORMAL LOW (ref 13.0–17.0)
MCH: 29.5 pg (ref 26.0–34.0)
MCHC: 32.4 g/dL (ref 30.0–36.0)
MCV: 90.9 fL (ref 78.0–100.0)
PLATELETS: 216 10*3/uL (ref 150–400)
RBC: 3.97 MIL/uL — AB (ref 4.22–5.81)
RDW: 13.3 % (ref 11.5–15.5)
WBC: 9.2 10*3/uL (ref 4.0–10.5)

## 2016-07-09 LAB — HEMOGLOBIN A1C
HEMOGLOBIN A1C: 5.8 % — AB (ref 4.8–5.6)
MEAN PLASMA GLUCOSE: 120 mg/dL

## 2016-07-09 MED ORDER — POTASSIUM CHLORIDE CRYS ER 20 MEQ PO TBCR: 20.0000 meq | EXTENDED_RELEASE_TABLET | ORAL | 8 refills | Status: DC

## 2016-07-09 MED ORDER — FUROSEMIDE 20 MG PO TABS: 20.0000 mg | ORAL_TABLET | Freq: Every day | ORAL | Status: DC

## 2016-07-09 NOTE — Progress Notes (Signed)
Pt and wife have been given all discharge instructions and they both verbalize understanding. Pt understands to pick up prescription meds at [facility] on [address].  All belongings with pt.  Pt transported via wc to car with wife.

## 2016-07-09 NOTE — Discharge Summary (Signed)
Physician Discharge Summary  Cory Knott [id number] DOB: May 17, 1946 DOA: 07/08/2016  PCP: [name], MD  Admit date: 07/08/2016 Discharge date: 07/09/2016   Recommendations for Outpatient Follow-Up:   1. BMP 1 week re Cr. 2. Close follow up with cardiology 3. Daily weights   Discharge Diagnosis:   Principal Problem:   Acute respiratory failure with hypoxia (HCC) Active Problems:   Essential hypertension   Hyperlipidemia with target LDL less than 70   Presence of drug coated stent in LAD coronary artery   Acute on chronic combined systolic and diastolic congestive heart failure (HCC)   Depression   CAD S/P percutaneous coronary angioplasty   CKD (chronic kidney disease), stage III   CAD (coronary artery disease)   GERD (gastroesophageal reflux disease)   Hypertensive urgency   Discharge disposition:  Home.  Discharge Condition: Improved.  Diet recommendation: Low sodium, heart healthy  Wound care: None.   History of Present Illness:   Angelita Gasparini is a 70 y.o. male with medical history significant of combined systolic and diastolic CHF with EF of 123XX123 percent, CAD, STEMI, s/p of stent, hypertension, hyperlipidemia, GERD, depression, carotid artery stenosis, CVA, CKD-III, who presents with shortness of breath.  Patient stated that he suddenly started having shortness breath in the midnight. He could barely speak in full sentences. He was diaphoretic. No chest pain, cough, fever or chills. Patient states that he has been taking Lasix when necessary. He gain 3 pounds of body weight recently. He denies nausea, vomiting, abdominal pain, diarrhea, symptoms of a UTI or unilateral weakness. Per report, his left hand was cool and had delayed capillary refill in ED. When I saw pt in ED, his both hands are warm and has good normal capillary refill. He had elevated blood pressure 231/86 which improved to 109/48 after placed NTG patch and he feels better. Pt states  that he never had CP when he had heart attack in the past.   Hospital Course by Problem:   Acute respiratory failure with hypoxia -diuresed well with IV lasix x 1 and will will restart low dose lasix daily -suspect patient will need higher dose  Acute on chronic combined systolic and diastolic congestive heart failure ([place]):  2-D echo on 5/17 showed EF 40-45 percent with grade 2 diastolic dysfunction. Now presents with CHF exacerbation.   Hypertensive urgency:  -improved  HLD: Last LDL was 6.2 on 05/14/16 -Continue home medications: Crestor  CAD and hx of STEMI: s/p of drug coated stent.  No chest pain. -continue ASA, Plavix, Coreg, Crestor -Follow-up troponin 3  CKD-III: Slightly worsening renal function. Baseline creatinine 1.3-1.4. His prednisone 1.57 on admission. -Follow-up renal function by BMP  GERD: -Protonix  Depression: Stable, no suicidal or homicidal ideations. -Continue home medications: Remeron    Medical Consultants:    None.   Discharge Exam:   Vitals:   07/09/16 0443 07/09/16 0804  BP: (!) 110/41 122/63  Pulse: (!) 51 (!) 57  Resp: 18 18  Temp: 98.6 F (37 C) 98.4 F (36.9 C)   Vitals:   07/08/16 2300 07/09/16 0050 07/09/16 0443 07/09/16 0804  BP: 120/60 (!) 108/36 (!) 110/41 122/63  Pulse: (!) 110 60 (!) 51 (!) 57  Resp: 18 18 18 18   Temp: 98.1 F (36.7 C) 97.6 F (36.4 C) 98.6 F (37 C) 98.4 F (36.9 C)  TempSrc: Oral Oral Oral Oral  SpO2: 97% 91% 91% 92%  Weight:   83.7 kg (184 lb 8 oz)   Height:  Gen:  NAD- laying flat in bed   The results of significant diagnostics from this hospitalization (including imaging, microbiology, ancillary and laboratory) are listed below for reference.     Procedures and Diagnostic Studies:   Dg Chest Portable 1 View  Result Date: 07/08/2016 CLINICAL DATA:  Acute onset of shortness of breath. Initial encounter. EXAM: PORTABLE CHEST 1 VIEW COMPARISON:  Chest radiograph performed  06/20/2015 FINDINGS: The lungs are well-aerated. Vascular congestion is noted. Increased interstitial markings raise concern for mild interstitial edema. There is no evidence of pleural effusion or pneumothorax. The cardiomediastinal silhouette is borderline normal in size. No acute osseous abnormalities are seen. IMPRESSION: Vascular congestion noted. Increased interstitial markings raise concern for mild interstitial edema. Electronically Signed   By: Garald Balding M.D.   On: 07/08/2016 02:41     Labs:   Basic Metabolic Panel:  Recent Labs Lab 07/08/16 0237 07/09/16 0430  NA 137 137  K 4.3 3.5  CL 102 101  CO2 28 29  GLUCOSE 181* 91  BUN 23* 20  CREATININE 1.57* 1.56*  CALCIUM 8.9 8.9   GFR Estimated Creatinine Clearance: 45.1 mL/min (by C-G formula based on SCr of 1.56 mg/dL (H)). Liver Function Tests:  Recent Labs Lab 07/08/16 0237  AST 27  ALT 24  ALKPHOS 49  BILITOT 0.9  PROT 7.3  ALBUMIN 3.7   No results for input(s): LIPASE, AMYLASE in the last 168 hours. No results for input(s): AMMONIA in the last 168 hours. Coagulation profile No results for input(s): INR, PROTIME in the last 168 hours.  CBC:  Recent Labs Lab 07/08/16 0237 07/09/16 0430  WBC 10.8* 9.2  NEUTROABS 6.7  --   HGB 12.8* 11.7*  HCT 39.8 36.1*  MCV 92.3 90.9  PLT 248 216   Cardiac Enzymes:  Recent Labs Lab 07/08/16 0451 07/08/16 1036 07/08/16 1654  TROPONINI 0.05* 0.05* 0.06*   BNP: Invalid input(s): POCBNP CBG: No results for input(s): GLUCAP in the last 168 hours. D-Dimer No results for input(s): DDIMER in the last 72 hours. Hgb A1c  Recent Labs  07/08/16 0451  HGBA1C 5.8*   Lipid Profile  Recent Labs  07/08/16 0451  CHOL 109  HDL 39*  LDLCALC 54  TRIG 78  CHOLHDL 2.8   Thyroid function studies No results for input(s): TSH, T4TOTAL, T3FREE, THYROIDAB in the last 72 hours.  Invalid input(s): FREET3 Anemia work up No results for input(s): VITAMINB12,  FOLATE, FERRITIN, TIBC, IRON, RETICCTPCT in the last 72 hours. Microbiology No results found for this or any previous visit (from the past 240 hour(s)).   Discharge Instructions:   Discharge Instructions    (HEART FAILURE PATIENTS) Call MD:  Anytime you have any of the following symptoms: 1) 3 pound weight gain in 24 hours or 5 pounds in 1 week 2) shortness of breath, with or without a dry hacking cough 3) swelling in the hands, feet or stomach 4) if you have to sleep on extra pillows at night in order to breathe.    Complete by:  As directed    Diet - low sodium heart healthy    Complete by:  As directed    Discharge instructions    Complete by:  As directed    BMP 1 week   Increase activity slowly    Complete by:  As directed        Medication List    TAKE these medications   aspirin 81 MG tablet Take 81 mg by mouth  daily.   carvedilol 12.5 MG tablet Commonly known as:  COREG Take 1 tablet (12.5 mg total) by mouth 2 (two) times daily.   clopidogrel 75 MG tablet Commonly known as:  PLAVIX Take 1 tablet (75 mg total) by mouth daily.   ferrous sulfate 325 (65 FE) MG tablet Take 325 mg by mouth daily with breakfast.   furosemide 20 MG tablet Commonly known as:  LASIX Take 1 tablet (20 mg total) by mouth daily. What changed:  medication strength  how much to take  when to take this  reasons to take this  Another medication with the same name was removed. Continue taking this medication, and follow the directions you see here.   mirtazapine 15 MG disintegrating tablet Commonly known as:  REMERON SOL-TAB Take 1 tablet (15 mg total) by mouth at bedtime.   nitroGLYCERIN 0.4 MG SL tablet Commonly known as:  NITROSTAT Place 1 tablet (0.4 mg total) under the tongue every 5 (five) minutes x 3 doses as needed for chest pain.   pantoprazole 40 MG tablet Commonly known as:  PROTONIX Take 40 mg by mouth daily.   potassium chloride SA 20 MEQ tablet Commonly known as:   KLOR-CON M20 Take 1 tablet (20 mEq total) by mouth every other day. What changed:  when to take this   rosuvastatin 40 MG tablet Commonly known as:  CRESTOR Take 1 tablet (40 mg total) by mouth daily at 6 PM.      Follow-up Information    [name], MD Follow up.   Specialty:  Family Medicine Contact information: [place] [address] [phone]        Glenetta Hew, MD Follow up in 2 week(s).   Specialty:  Cardiology Why:  lasix management-- wife requests appointment as late as posible-- after 3 PM Contact information: [place] [address] [phone]            Time coordinating discharge: 35 min  Signed:  Lenis Nettleton U Cledith Kamiya   [facility] 07/09/2016, 10:09 AM

## 2016-07-09 NOTE — Progress Notes (Signed)
Heart Failure Navigator Consult Note  Presentation: Damon Walker is a 70 y.o. male with medical history significant of combined systolic and diastolic CHF with EF of 123XX123 percent, CAD, STEMI, s/p of stent, hypertension, hyperlipidemia, GERD, depression, carotid artery stenosis, CVA, CKD-III, who presents with shortness of breath.  Patient stated that he suddenly started having shortness breath in the midnight. He could barely speak in full sentences. He was diaphoretic. No chest pain, cough, fever or chills. Patient states that he has been taking Lasix when necessary. He gain 3 pounds of body weight recently. He denies nausea, vomiting, abdominal pain, diarrhea, symptoms of a UTI or unilateral weakness. Per report, his left hand was cool and had delayed capillary refill in ED. When I saw pt in ED, his both hands are warm and has good normal capillary refill. He had elevated blood pressure 231/86 which improved to 109/48 after placed NTG patch and he feels better. Pt states that he never had CP when he had heart attack in the past.   Past Medical History:  Diagnosis Date  . CAD S/P percutaneous coronary angioplasty 05/11/2015   PCI to Prox-Mid LAD with Overlapping Promus Premier DES 3.0 x 38 & 3.0 x 16 (post-dilated to 3.5 mm)   . Carotid arterial disease ([place]) 06/2014; 01/09/2015   Followed by Dr. Donnetta Hutching of [facility] -- a) Carotid u/s: 40-59% bilat ICA stenosis; b) Mod 50-69% Bilateral R Int Carotid (heteroenous plaque), Normal L Vert A, unable to find R Vert A.   . 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 ([place]) 05/14/2015   -- Given TPA.  MRI Brain: Acute L Occipital Lobe Infarct. Chronic microvascular ischemic changes in the white matter and left pons.  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 Chronic combined systolic and diastolic congestive heart failure, NYHA class 2 ([place]) 05/11/2015 - 05/15/15   a. EF 20-25% with  anterior-anteroseptal akinesis (immediately post anterior STEMI).  ; b. 10/10/'16 Echo: EF 35-40% with moderate mid-apical anteroseptal, anterior and apical hypokinesis.  . Chronic combined systolic and diastolic congestive heart failure, NYHA class 2 ([place])   . Coronary artery disease involving native heart 05/11/2015   Cath  three-vessel CAD with 90% prox RCA, 80% mid RCA, 80% OM3, 100% prox LAD occlusion treated with DESx2. RCA and OM residual treated medically for now, however will need PCI if has recurrent CP  . Essential hypertension   . H/O: GI bleed 05/15/2015   s/p Sigmoid Polypectomy 05/04/2015 - prior to STEMI on May 11, 2015; Had GI Bleed following TPA for CVA, while on ASA & Brilinta; Flex Sig - 1. Internal Hemorrhoids, 2. Distal sigmoid polypectomy site with flat [place] status post biopsy and tattoo with 64mL spot over 3 injections 3. Proximal sigmoid polypectomy site seen as well, 4) otherwise normal with no evidence of bleed  . Hyperlipidemia with target LDL less than 70   . Ischemic cardiomyopathy 05/11/2015   EF 40% on cath 05/11/2015 after anterior STEMI, EF 20-25% on echo 05/13/2015;   . Prediabetes Oct 2016   A1C 6.0 in Oct 2016  . ST elevation myocardial infarction (STEMI) involving left anterior descending (LAD) coronary artery with complication ([place]) [id number]   100% Prox LAD - PCI with overlapping DES x 2  . Tobacco abuse    a. 30 yrs - 1.5 ppd. - Quit 05/11/2015  . Vision abnormalities    Right hemianopsia    Social History   Social  History  . Marital status: Married    Spouse name: N/A  . Number of children: 3  . Years of education: N/A   Occupational History  . retired from [employer]  . Smoking status: Former Smoker    Packs/day: 1.00    Years: 30.00    Types: Cigarettes    Quit date: 04/06/2015  . Smokeless tobacco: Never Used     Comment: Quit the day of his STEMI  . Alcohol use No  . Drug use: No  . Sexual activity:  Not Asked   Other Topics Concern  . None   Social History Narrative   Lives in [place] with wife. Originally from [place].    ECHO:Study Conclusions-07/08/16  - Left ventricle: The cavity size was mildly dilated. Wall   thickness was increased in a pattern of mild LVH. Systolic   function was moderately to severely reduced. The estimated   ejection fraction was in the range of 30% to 35%. Features are   consistent with a pseudonormal left ventricular filling pattern,   with concomitant abnormal relaxation and increased filling   pressure (grade 2 diastolic dysfunction). - Left atrium: The atrium was mildly to moderately dilated.  ------------------------------------------------------------------- Study data:  The previous study was not available, so comparison was made to the report of 12/05/2015.  Study status:  Routine. Procedure:  Transthoracic echocardiography. Image quality was adequate.  Study completion:  There were no complications. Transthoracic echocardiography.  M-mode, complete 2D, spectral Doppler, and color Doppler.  Birthdate:  Patient birthdate: [birth year].  Age:  Patient is 70 yr old.  Sex:  Gender: male. BMI: 28.6 kg/m^2.  Blood pressure:     134/59  Patient status: Inpatient.  Study date:  Study date: 07/08/2016. Study time: 11:51 AM.  Location:  Bedside.  BNP    Component Value Date/Time   BNP 623.3 (H) 07/08/2016 0237    ProBNP No results found for: PROBNP   Education Assessment and Provision:  Detailed education and instructions provided on heart failure disease management including the following:  Signs and symptoms of Heart Failure When to call the physician Importance of daily weights Low sodium diet Fluid restriction Medication management Anticipated future follow-up appointments  Patient education given on each of the above topics.  Patient acknowledges understanding and acceptance of all instructions.  I spoke with Mr. Heinzen and his  wife regarding his current admission and HF diagnosis.  His wife says he weighs everyday and they call if his weight is up.  He was on a PRN dose of Lasix per his PCP--he took if weight was up 3 lbs in one night.  She said he slowly continued to gain weight.  She tells me that they are very careful with eating a low sodium diet. They deny any issues with getting or taking prescribed medications.  He follows with [facility] as outpatient.  Education Materials:  "Living Better With Heart Failure" Booklet, Daily Weight Tracker Tool    High Risk Criteria for Readmission and/or Poor Patient Outcomes:   EF <30%- 30-35% with grade 2 dias dys  2 or more admissions in 6 months-1/ 70mo  Difficult social situation- no  Demonstrates medication noncompliance-denies  Barriers of Care:  Knowledge and compliance  Discharge Planning:   He plans to return home with wife in [place]

## 2016-07-15 ENCOUNTER — Telehealth (HOSPITAL_COMMUNITY): Payer: Self-pay | Admitting: Surgery

## 2016-07-15 NOTE — Telephone Encounter (Signed)
Heart Failure Nurse Navigator Post Discharge Telephone Call  I spoke with Damon Walker regarding his recent hospitalization and HF.  He tells me that he is doing "well".  He is weighing daily - as before.  His weight today is 187 versus discharge weight of 184.8 on 12/5.  He denies any SOB or increased swelling.   He also says that they continue to review labels and he is very careful to eat low sodium.  He has a follow-up scheduled appt on 12/15 at [facility].  I have encouraged he or his wife to call with any concerns or questions related to his HF.

## 2016-07-17 ENCOUNTER — Ambulatory Visit: Payer: Medicare Other | Admitting: Physician Assistant

## 2016-07-19 ENCOUNTER — Encounter: Payer: Self-pay | Admitting: Physician Assistant

## 2016-07-19 ENCOUNTER — Ambulatory Visit (INDEPENDENT_AMBULATORY_CARE_PROVIDER_SITE_OTHER): Payer: Medicare Other | Admitting: Physician Assistant

## 2016-07-19 VITALS — BP 132/78 | HR 60 | Ht 68.0 in | Wt 189.6 lb

## 2016-07-19 DIAGNOSIS — I2510 Atherosclerotic heart disease of native coronary artery without angina pectoris: Secondary | ICD-10-CM

## 2016-07-19 DIAGNOSIS — I779 Disorder of arteries and arterioles, unspecified: Secondary | ICD-10-CM

## 2016-07-19 DIAGNOSIS — I10 Essential (primary) hypertension: Secondary | ICD-10-CM | POA: Diagnosis not present

## 2016-07-19 DIAGNOSIS — I5022 Chronic systolic (congestive) heart failure: Secondary | ICD-10-CM

## 2016-07-19 DIAGNOSIS — I160 Hypertensive urgency: Secondary | ICD-10-CM | POA: Diagnosis not present

## 2016-07-19 DIAGNOSIS — I255 Ischemic cardiomyopathy: Secondary | ICD-10-CM

## 2016-07-19 DIAGNOSIS — I739 Peripheral vascular disease, unspecified: Secondary | ICD-10-CM

## 2016-07-19 MED ORDER — HYDRALAZINE HCL 10 MG PO TABS: 10.0000 mg | ORAL_TABLET | Freq: Two times a day (BID) | ORAL | 3 refills | Status: DC

## 2016-07-19 MED ORDER — ISOSORBIDE MONONITRATE ER 30 MG PO TB24: 15.0000 mg | ORAL_TABLET | Freq: Every day | ORAL | 3 refills | Status: DC

## 2016-07-19 MED ORDER — POTASSIUM CHLORIDE CRYS ER 20 MEQ PO TBCR: 20.0000 meq | EXTENDED_RELEASE_TABLET | ORAL | 8 refills | Status: DC

## 2016-07-19 NOTE — Patient Instructions (Signed)
Medication Instructions:  START Imdur 15mg  Take 1 tablet by mouth once a day START Hydralazine 10mg  Take 1 tablet by mouth twice a day  Labwork: Your physician recommends that you return for lab work FROM YOUR PCP NEXT WEEK--BMP, SEND A COPY OF RESULTS TO OUR OFFICE  Testing/Procedures: NONE   Follow-Up: Your physician recommends that you schedule a follow-up appointment in: 2 MONTHS WITH DR [facility]   Any Other Special Instructions Will Be Listed Below (If Applicable).  If you need a refill on your cardiac medications before your next appointment, please call your pharmacy.

## 2016-07-19 NOTE — Progress Notes (Signed)
Cardiology Office Note    Date:  07/19/2016   ID:  Damon Walker, DOB [birth year], MRN [id number]  PCP:  [name], MD  Cardiologist:  Dr. Ellyn Hack   Chief Complaint  Patient presents with  . Hospitalization Follow-up    seen for Dr. Ellyn Hack, recent admission for hypertensive urgency and heart failure    History of Present Illness:  Damon Walker is a 70 y.o. male with PMH of carotid artery disease, coronary artery disease s/p stent to LAD in October 2016, CVA, stage III CKD, chronic combined systolic and diastolic heart failure, hypertension, hyperlipidemia and ischemic cardiomyopathy. Patient was most recently admitted on the 07/08/2016 for shortness of breath. According to the patient, he suddenly started having shortness breath around midnight. He also gained roughly 3 pounds of body weight. On initial arrival his blood pressure was elevated at 231/86 which improved to 109/48 after placed on glycerin patch.   He presents today for cardiology office follow-up. Per patient he has never experienced a sudden onset of elevated blood pressure like this. He also has never had flash pulmonary edema before. Due to history of stroke, he does not remember what was his previous angina symptom, however his wife was with him during the visit was quite confident he had chest pain back then. He says he has not had any exertional symptoms or chest discomfort recently. And this episode of heart failure and elevated blood pressure caught them by surprise because he was in his usual state of health prior to this.   Even more interestingly, the repeat echocardiogram obtained during the recent visit on 07/08/2016 shows his ejection fraction is now down to 30-35%, mild LVH, grade 2 diastolic dysfunction. Review of the previous echocardiogram shows that his ejection fraction reached a nadir of 20-25% in October 2016. However most recently improved to 40-45% in May 2017. This echocardiogram obtained during  the recent hospitalization shows his ejection fraction has went down slightly again. The only heart failure medication on his medication list seems to be carvedilol, his systolic blood pressures in the 130s today. Given his kidney issue, I will hold off on adding ACEI/ARB, I will however add a low-dose Imdur and hydralazine given his LV dysfunction. I will further review his echocardiogram finding with Dr. Ellyn Hack. He denies any obvious anginal symptoms, therefore I did not pursue any ischemic workup at this time pending further discussion with Dr. Ellyn Hack.    Past Medical History:  Diagnosis Date  . CAD S/P percutaneous coronary angioplasty 05/11/2015   PCI to Prox-Mid LAD with Overlapping Promus Premier DES 3.0 x 38 & 3.0 x 16 (post-dilated to 3.5 mm)   . Carotid arterial disease ([place]) 06/2014; 01/09/2015   Followed by Dr. Donnetta Hutching of [facility] -- a) Carotid u/s: 40-59% bilat ICA stenosis; b) Mod 50-69% Bilateral R Int Carotid (heteroenous plaque), Normal L Vert A, unable to find R Vert A.   . 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 ([place]) 05/14/2015   -- Given TPA.  MRI Brain: Acute L Occipital Lobe Infarct. Chronic microvascular ischemic changes in the white matter and left pons.  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 Chronic combined systolic and diastolic congestive heart failure, NYHA class 2 ([place]) 05/11/2015 - 05/15/15   a. EF 20-25% with anterior-anteroseptal akinesis (immediately post anterior STEMI).  ; b. 10/10/'16 Echo: EF 35-40% with moderate mid-apical anteroseptal, anterior and apical hypokinesis.  . Chronic combined systolic and diastolic  congestive heart failure, NYHA class 2 ([place])   . Coronary artery disease involving native heart 05/11/2015   Cath  three-vessel CAD with 90% prox RCA, 80% mid RCA, 80% OM3, 100% prox LAD occlusion treated with DESx2. RCA and OM residual treated medically for now, however will need  PCI if has recurrent CP  . Essential hypertension   . H/O: GI bleed 05/15/2015   s/p Sigmoid Polypectomy 05/04/2015 - prior to STEMI on May 11, 2015; Had GI Bleed following TPA for CVA, while on ASA & Brilinta; Flex Sig - 1. Internal Hemorrhoids, 2. Distal sigmoid polypectomy site with flat Whitebase status post biopsy and tattoo with 63mL spot over 3 injections 3. Proximal sigmoid polypectomy site seen as well, 4) otherwise normal with no evidence of bleed  . Hyperlipidemia with target LDL less than 70   . Ischemic cardiomyopathy 05/11/2015   EF 40% on cath 05/11/2015 after anterior STEMI, EF 20-25% on echo 05/13/2015;   . Prediabetes Oct 2016   A1C 6.0 in Oct 2016  . ST elevation myocardial infarction (STEMI) involving left anterior descending (LAD) coronary artery with complication ([place]) [id number]   100% Prox LAD - PCI with overlapping DES x 2  . Tobacco abuse    a. 30 yrs - 1.5 ppd. - Quit 05/11/2015  . Vision abnormalities    Right hemianopsia    Past Surgical History:  Procedure Laterality Date  . CARDIAC CATHETERIZATION N/A 05/11/2015   Procedure: Left Heart Cath and Coronary Angiography;  Surgeon: Peter M Martinique, MD;  Location: [facility];  Service: Cardiovascular;  100% pLAD (long lesion). RCA - prox 90%, mid 80%. OM2 & OM3 80% (OM2 & 3 not necessarily PCI targets); EF 35-45% with Anterio HK  . CARDIAC CATHETERIZATION N/A 05/11/2015   Procedure: Coronary Stent Intervention;  Surgeon: Peter M Martinique, MD;  Location: [facility];  Service: Cardiovascular; PCI to Prox-Mid LAD with Overlapping Promus Premier DES 3.0 x 38 & 3.0 x 16 (post-dilated to 3.5 mm)    . FLEXIBLE SIGMOIDOSCOPY N/A 05/18/2015   Procedure: FLEXIBLE SIGMOIDOSCOPY;  Surgeon: Clarene Essex, MD;  Location: [facility];  Service: Endoscopy;  Laterality: N/A;  . S/P Appendectomy     Age 11  . S/P Inguinal Hernia Repair     In his 20's.  . TRANSTHORACIC ECHOCARDIOGRAM  05/13/2015   Mlid LVH. EF 20-25% - Akinesis of  mid-Apical Anterior, basal-mid AnteroSeptal, Mid Anterolateral, apical septal, apical-lateral & apical  walls.  Abnormal Diastolic function - indeterminate  . TRANSTHORACIC ECHOCARDIOGRAM  05/15/2015   Mild LVH, EF 35-40% - Mod HK of mid-apical anteroseptal, anterior & apical walls.  Gr 1 DD. Mild bilateral Atrial Enlargement.    Current Medications: Outpatient Medications Prior to Visit  Medication Sig Dispense Refill  . aspirin 81 MG tablet Take 81 mg by mouth daily.    . carvedilol (COREG) 12.5 MG tablet Take 1 tablet (12.5 mg total) by mouth 2 (two) times daily. 180 tablet 3  . clopidogrel (PLAVIX) 75 MG tablet Take 1 tablet (75 mg total) by mouth daily. 30 tablet 1  . ferrous sulfate 325 (65 FE) MG tablet Take 325 mg by mouth daily with breakfast.    . furosemide (LASIX) 20 MG tablet Take 1 tablet (20 mg total) by mouth daily. 30 tablet   . mirtazapine (REMERON SOL-TAB) 15 MG disintegrating tablet Take 1 tablet (15 mg total) by mouth at bedtime. 30 tablet 1  . nitroGLYCERIN (NITROSTAT) 0.4 MG SL tablet  Place 1 tablet (0.4 mg total) under the tongue every 5 (five) minutes x 3 doses as needed for chest pain. 25 tablet 3  . pantoprazole (PROTONIX) 40 MG tablet Take 40 mg by mouth daily.     . rosuvastatin (CRESTOR) 40 MG tablet Take 1 tablet (40 mg total) by mouth daily at 6 PM. 90 tablet 3  . potassium chloride SA (KLOR-CON M20) 20 MEQ tablet Take 1 tablet (20 mEq total) by mouth every other day. 30 tablet 8   No facility-administered medications prior to visit.      Allergies:   Latex   Social History   Social History  . Marital status: Married    Spouse name: N/A  . Number of children: 3  . Years of education: N/A   Occupational History  . retired from [employer]  . Smoking status: Former Smoker    Packs/day: 1.00    Years: 30.00    Types: Cigarettes    Quit date: 04/06/2015  . Smokeless tobacco: Never Used     Comment: Quit the day of  his STEMI  . Alcohol use No  . Drug use: No  . Sexual activity: Not Asked   Other Topics Concern  . None   Social History Narrative   Lives in [place] with wife. Originally from [place].     Family History:  The patient's family history includes Cancer in his mother; Other in his mother. He was adopted.   ROS:   Please see the history of present illness.    ROS All other systems reviewed and are negative.   PHYSICAL EXAM:   VS:  BP 132/78   Pulse 60   Ht 5\' 8"  (1.727 m)   Wt 189 lb 9.6 oz (86 kg)   BMI 28.83 kg/m    GEN: Well nourished, well developed, in no acute distress  HEENT: normal  Neck: no JVD, carotid bruits, or masses Cardiac: RRR; no murmurs, rubs, or gallops,no edema  Respiratory:  clear to auscultation bilaterally, normal work of breathing GI: soft, nontender, nondistended, + BS MS: no deformity or atrophy  Skin: warm and dry, no rash Neuro:  Alert and Oriented x 3, Strength and sensation are intact Psych: euthymic mood, full affect  Wt Readings from Last 3 Encounters:  07/19/16 189 lb 9.6 oz (86 kg)  07/09/16 184 lb 8 oz (83.7 kg)  06/11/16 190 lb (86.2 kg)      Studies/Labs Reviewed:   EKG:  EKG is ordered today.  The ekg ordered today demonstrates Normal sinus rhythm with T-wave inversion in V4 through V6. T-wave inversion in the lateral leads appears to be new when compared to the recent EKG on 12/5.  Recent Labs: 07/08/2016: ALT 24; B Natriuretic Peptide 623.3 07/09/2016: BUN 20; Creatinine, Ser 1.56; Hemoglobin 11.7; Platelets 216; Potassium 3.5; Sodium 137   Lipid Panel    Component Value Date/Time   CHOL 109 07/08/2016 0451   TRIG 78 07/08/2016 0451   HDL 39 (L) 07/08/2016 0451   CHOLHDL 2.8 07/08/2016 0451   VLDL 16 07/08/2016 0451   LDLCALC 54 07/08/2016 0451    Additional studies/ records that were reviewed today include:   Cath 05/11/2015 Conclusion    Prox RCA lesion, 90% stenosed.  Mid RCA lesion, 80% stenosed.  Dist RCA  lesion, 40% stenosed.  Ost 2nd Mrg to 2nd Mrg lesion, 45% stenosed.  3rd Mrg lesion, 80% stenosed.  There is moderate left  ventricular systolic dysfunction.  Prox LAD lesion, 100% stenosed. Post intervention, there is a 0% residual stenosis.  Lat 2nd Mrg lesion, 80% stenosed.   1. 3 vessel obstructive CAD. The proximal LAD is occluded and is the culprit vessel. Severe disease in the proximal and mid RCA. There is also severe disease in the small branches of the LCx. 2. Moderate LV dysfunction. EF estimated at 40%. 3. Elevated LV EDP 4. Successful stenting of the proximal to mid LAD with DES x 2.    Echo 05/13/2015 LV EF: 20% -   25%  - Left ventricle: The cavity size was normal. Wall thickness was   increased in a pattern of mild LVH. Systolic function was   severely reduced. The estimated ejection fraction was in the   range of 20% to 25%. Diastolic function is abnormal,   indeterminate grade. - Regional wall motion abnormality: Akinesis of the mid-apical   anterior, basal-mid anteroseptal, mid anterolateral, apical   septal, apical lateral, and apical myocardium. - Aortic valve: Valve area (VTI): 2.76 cm^2. Valve area (Vmax):   3.36 cm^2. - Systemic veins: IVC is small suggesting low RA pressures and   hypovolemia.   Echo 12/05/2015 LV EF: 40% -   45%  - Left ventricle: The cavity size was moderately dilated. Wall   thickness was normal. Systolic function was mildly to moderately   reduced. The estimated ejection fraction was in the range of 40%   to 45%. Anterior and apical hypokinesis. Doppler parameters are   consistent with pseudonormal left ventricular relaxation (grade 2   diastolic dysfunction). The E/A ratio is >1.5. The E/e&' ratio is   >15. - Mitral valve: Mildly thickened leaflets . There was mild   regurgitation. - Left atrium: Mildly dilated at 36 ml/m2. - Right ventricle: The cavity size was mildly dilated. Systolic   function is low normal. - Right  atrium: The atrium was mildly dilated. - Tricuspid valve: There was moderate regurgitation. - Pulmonary arteries: PA peak pressure: 42 mm Hg (S). - Inferior vena cava: The vessel was normal in size. The   respirophasic diameter changes were in the normal range (>= 50%),   consistent with normal central venous pressure.  Impressions:  - Compared to a prior echo in 2016, the EF his mildly improved to   40-45% with anterior and apical wall motion abnormalities. The LV   is moderately dilated with elevated LV Filling pressure. RVSP is   42 mmHg with moderate TR and mild MR.   Echo 07/08/2016 LV EF: 30% -   35%  - Left ventricle: The cavity size was mildly dilated. Wall   thickness was increased in a pattern of mild LVH. Systolic   function was moderately to severely reduced. The estimated   ejection fraction was in the range of 30% to 35%. Features are   consistent with a pseudonormal left ventricular filling pattern,   with concomitant abnormal relaxation and increased filling   pressure (grade 2 diastolic dysfunction). - Left atrium: The atrium was mildly to moderately dilated.   ASSESSMENT:    1. Chronic systolic heart failure ([place])   2. Hypertensive urgency   3. Essential hypertension   4. Bilateral carotid artery disease ([place])   5. Coronary artery disease involving native coronary artery of native heart without angina pectoris   6. Cardiomyopathy, ischemic: EF improved to 35-40% from 20-25% post PCI LAD      PLAN:  In order of problems listed above:  1. Chronic  systolic heart failure: Unclear cause for recent heart failure exacerbation. Appears to be caused by sudden onset of hypertensive urgency. He has since been placed on a daily dose of 20 mg of Lasix. He will need a basic metabolic panel, however patient wished to obtain this at his PCPs office next week. Also noted on recent echocardiogram that his ejection fraction went down again from the previous 40-45% in May  2017 down to 30-35% in December 2017. Will discuss with Dr. Ellyn Hack, given the previous post PCI stroke, we'll try to manage him medically at this point. I have added Imdur and hydralazine to his medical regimen.  2. CAD s/p DES to LAD in 05/2015, he does have multiple RCA and obtuse marginal lesion that was managed medically.  3. Carotid artery disease: Recent repeat Doppler in November 2017 showed moderate left ICA stenosis, and mild right ICA stenosis.  4. CVA: occurred in October 2016 after cardiac catheterization.  5. Hypertensive urgency: Unclear cause for the recent sudden onset of hypertensive urgency, no prior episodes before. This could be an isolated incident, however if it does recur again, we will consider renal artery ultrasound. He does not have any obvious bruit on physical exam in the abdominal area.    Medication Adjustments/Labs and Tests Ordered: Current medicines are reviewed at length with the patient today.  Concerns regarding medicines are outlined above.  Medication changes, Labs and Tests ordered today are listed in the Patient Instructions below. Patient Instructions  Medication Instructions:  START Imdur 15mg  Take 1 tablet by mouth once a day START Hydralazine 10mg  Take 1 tablet by mouth twice a day  Labwork: Your physician recommends that you return for lab work FROM YOUR PCP NEXT WEEK--BMP, SEND A COPY OF RESULTS TO OUR OFFICE  Testing/Procedures: NONE   Follow-Up: Your physician recommends that you schedule a follow-up appointment in: 2 MONTHS WITH DR [facility]   Any Other Special Instructions Will Be Listed Below (If Applicable).  If you need a refill on your cardiac medications before your next appointment, please call your pharmacy.     Hilbert Corrigan, [place]  07/19/2016 11:28 PM    [place] [address] [place], [place], [place]  [address] Phone: [phone]; Fax: [phone]

## 2016-07-22 ENCOUNTER — Telehealth: Payer: Self-pay | Admitting: Physician Assistant

## 2016-07-22 DIAGNOSIS — R0989 Other specified symptoms and signs involving the circulatory and respiratory systems: Secondary | ICD-10-CM

## 2016-07-22 DIAGNOSIS — R931 Abnormal findings on diagnostic imaging of heart and coronary circulation: Secondary | ICD-10-CM

## 2016-07-22 NOTE — Telephone Encounter (Signed)
Discussed with Dr. Ellyn Hack regarding recent drop in EF on echo. Patient denies any angina, however given recent drop of EF from previous 40-45% down to 30-35% and also previously known residual 80-90% stenosis in RCA on medical therapy, the best option is to pursue outpatient stress test. Will ask office staff to arrange.   Hilbert Corrigan PA Pager: [phone]

## 2016-07-23 ENCOUNTER — Telehealth: Payer: Self-pay | Admitting: Cardiology

## 2016-07-23 NOTE — Telephone Encounter (Signed)
Returned call to patient.  Notified him of Hao/Dr [name]'s recommendations. Patient verbalized understanding and agreed with plan.  Exercise nuclear stress test ordered. Message routed to admin for scheduling.

## 2016-07-23 NOTE — Telephone Encounter (Signed)
See telephone encounter from 07/22/16.

## 2016-07-23 NOTE — Telephone Encounter (Signed)
[email or web address]

## 2016-07-23 NOTE — Telephone Encounter (Signed)
Returning your call. °

## 2016-07-23 NOTE — Addendum Note (Signed)
Addended by: Diana Eves on: 07/23/2016 01:26 PM   Modules accepted: Orders

## 2016-07-25 DIAGNOSIS — Z0000 Encounter for general adult medical examination without abnormal findings: Secondary | ICD-10-CM | POA: Diagnosis not present

## 2016-07-25 DIAGNOSIS — E785 Hyperlipidemia, unspecified: Secondary | ICD-10-CM | POA: Diagnosis not present

## 2016-07-25 DIAGNOSIS — D649 Anemia, unspecified: Secondary | ICD-10-CM | POA: Diagnosis not present

## 2016-07-25 DIAGNOSIS — I5022 Chronic systolic (congestive) heart failure: Secondary | ICD-10-CM | POA: Diagnosis not present

## 2016-07-25 DIAGNOSIS — Z8719 Personal history of other diseases of the digestive system: Secondary | ICD-10-CM | POA: Diagnosis not present

## 2016-07-25 DIAGNOSIS — Z09 Encounter for follow-up examination after completed treatment for conditions other than malignant neoplasm: Secondary | ICD-10-CM | POA: Diagnosis not present

## 2016-07-25 DIAGNOSIS — I10 Essential (primary) hypertension: Secondary | ICD-10-CM | POA: Diagnosis not present

## 2016-08-05 HISTORY — PX: NM MYOVIEW LTD: HXRAD82

## 2016-08-20 DIAGNOSIS — N189 Chronic kidney disease, unspecified: Secondary | ICD-10-CM | POA: Diagnosis not present

## 2016-08-23 ENCOUNTER — Telehealth (HOSPITAL_COMMUNITY): Payer: Self-pay | Admitting: Cardiology

## 2016-08-26 NOTE — Telephone Encounter (Signed)
Close encounter 

## 2016-08-30 ENCOUNTER — Telehealth (HOSPITAL_COMMUNITY): Payer: Self-pay

## 2016-08-30 NOTE — Telephone Encounter (Signed)
Encounter complete. 

## 2016-09-03 ENCOUNTER — Ambulatory Visit (HOSPITAL_COMMUNITY)
Admission: RE | Admit: 2016-09-03 | Discharge: 2016-09-03 | Disposition: A | Discharge status: Home / Self Care | Payer: Medicare Other | Source: Ambulatory Visit | Attending: Cardiovascular Disease | Admitting: Cardiovascular Disease

## 2016-09-03 DIAGNOSIS — R0989 Other specified symptoms and signs involving the circulatory and respiratory systems: Secondary | ICD-10-CM | POA: Insufficient documentation

## 2016-09-03 DIAGNOSIS — R931 Abnormal findings on diagnostic imaging of heart and coronary circulation: Secondary | ICD-10-CM

## 2016-09-03 LAB — MYOCARDIAL PERFUSION IMAGING
CHL CUP NUCLEAR SDS: 5
CHL CUP RESTING HR STRESS: 48 {beats}/min
LVDIAVOL: 185 mL (ref 62–150)
LVSYSVOL: 124 mL
NUC STRESS TID: 1.08
Peak HR: 84 {beats}/min
SRS: 17
SSS: 22

## 2016-09-03 MED ORDER — TECHNETIUM TC 99M TETROFOSMIN IV KIT
9.6000 | PACK | Freq: Once | INTRAVENOUS | Status: DC | PRN
  Filled 2016-09-03: qty 10

## 2016-09-03 MED ORDER — TECHNETIUM TC 99M TETROFOSMIN IV KIT
10.9000 | PACK | Freq: Once | INTRAVENOUS | Status: AC | PRN
  Administered 2016-09-03: 10.9 via INTRAVENOUS
  Filled 2016-09-03: qty 11

## 2016-09-03 MED ORDER — TECHNETIUM TC 99M TETROFOSMIN IV KIT
32.0000 | PACK | Freq: Once | INTRAVENOUS | Status: AC | PRN
  Administered 2016-09-03: 32 via INTRAVENOUS
  Filled 2016-09-03: qty 32

## 2016-09-03 MED ORDER — REGADENOSON 0.4 MG/5ML IV SOLN
0.4000 mg | Freq: Once | INTRAVENOUS | Status: AC
  Administered 2016-09-03: 0.4 mg via INTRAVENOUS

## 2016-09-03 MED ORDER — TECHNETIUM TC 99M TETROFOSMIN IV KIT
31.0000 | PACK | Freq: Once | INTRAVENOUS | Status: DC | PRN
  Filled 2016-09-03: qty 31

## 2016-09-24 ENCOUNTER — Encounter: Payer: Self-pay | Admitting: Cardiology

## 2016-09-24 ENCOUNTER — Ambulatory Visit (INDEPENDENT_AMBULATORY_CARE_PROVIDER_SITE_OTHER): Payer: Medicare Other | Admitting: Cardiology

## 2016-09-24 ENCOUNTER — Ambulatory Visit: Payer: Medicare Other | Admitting: Cardiology

## 2016-09-24 VITALS — BP 116/64 | HR 56 | Ht 68.0 in | Wt 192.0 lb

## 2016-09-24 DIAGNOSIS — R0609 Other forms of dyspnea: Secondary | ICD-10-CM | POA: Diagnosis not present

## 2016-09-24 DIAGNOSIS — E785 Hyperlipidemia, unspecified: Secondary | ICD-10-CM | POA: Diagnosis not present

## 2016-09-24 DIAGNOSIS — R0600 Dyspnea, unspecified: Secondary | ICD-10-CM

## 2016-09-24 DIAGNOSIS — I2510 Atherosclerotic heart disease of native coronary artery without angina pectoris: Secondary | ICD-10-CM

## 2016-09-24 DIAGNOSIS — I10 Essential (primary) hypertension: Secondary | ICD-10-CM

## 2016-09-24 DIAGNOSIS — I255 Ischemic cardiomyopathy: Secondary | ICD-10-CM | POA: Diagnosis not present

## 2016-09-24 DIAGNOSIS — Z9861 Coronary angioplasty status: Secondary | ICD-10-CM

## 2016-09-24 DIAGNOSIS — I2109 ST elevation (STEMI) myocardial infarction involving other coronary artery of anterior wall: Secondary | ICD-10-CM

## 2016-09-24 DIAGNOSIS — I160 Hypertensive urgency: Secondary | ICD-10-CM

## 2016-09-24 NOTE — Patient Instructions (Addendum)
LABS - BNP ,BMP -  Anytime of the day  Your physician recommends that you weigh, daily, at the same time every day, and in the same amount of clothing.   Schedule at [facility]  [address]-- April 2018 Your physician has requested that you have an echocardiogram. Echocardiography is a painless test that uses sound waves to create images of your heart. It provides your doctor with information about the size and shape of your heart and how well your heart's chambers and valves are working. This procedure takes approximately one hour. There are no restrictions for this procedure.  Your physician recommends that you schedule a follow-up appointment in April /MAY 2018 WITH DR [facility] AFTER ECHO.   If you need a refill on your cardiac medications before your next appointment, please call your pharmacy

## 2016-09-24 NOTE — Progress Notes (Signed)
PCP: [name], MD  Clinic Note: Chief Complaint  Patient presents with  . Follow-up    NO chest pain, no other complaints   . Coronary Artery Disease  . Cardiomyopathy    No true heart failure symptoms    HPI: Damon Walker is a 71 y.o. male with a PMH below who presents today for Two-month follow-up for his CAD/ischemic cardiomyopathy. He has history of anterior STEMI status post PCI to the LAD in October 2016 followed by a stroke where her son had coronary artery disease. He has some chronic combined systolic and diastolic heart failure as well as hypertension, hyperlipidemia and stage III C KD.  Recent Hospitalizations: 07/08/2016 admitted for acute on chronic heart failure in the setting of hypertensive emergency. His blood pressure was 231/86 that rapidly improved with nitroglycerin.   Zaidon Higa was last seen December 15 by Almyra Deforest, PA - for post-hospital f/u .  At that time, he was feeling fine with no recurrent symptoms. Because of his renal insufficiency, he was started on hydralazine plus Imdur. We decided to check it Myoview to evaluate the reduced EF. Results reviewed below  Studies Reviewed:   Myoview January 30: EF 30% with large mostly fixed defect in mid-antero-mid anteroseptal apical anterior apical septal and apical lateral wall consistent with prior MI in the LAD distribution. This is associated with moderate severely reduced EF. (Read as high risk due to large infarct)  2-D echo Dec 2017: EF 30-35% with grade 2 diastolic dysfunction.   Interval History: [name] presents today actually feeling pretty good. He really has no major complaints. He does note having some exertional dyspnea, notes that his breathing is much improved. He has never had an episode like when he had during his last hospitalization, and has not had one since. He is trying to get active and do exercise, but is limited by his right hip pain. Cardiac review of systems standpoint he is  doing relatively well overall.  No chest pain or shortness of breath with rest or exertion (unless he has strenuous exertion.  No PND, orthopnea or edema.  No palpitations, weakness or syncope/near syncope. He does get some additional lightheadedness however. No TIA/amaurosis fugax symptoms. No melena, hematochezia, hematuria, or epstaxis. No claudication.  ROS: A comprehensive was performed. Review of Systems  Constitutional: Positive for malaise/fatigue (Starting to regain energy).  HENT: Negative for congestion and nosebleeds.   Respiratory: Negative for cough and shortness of breath.   Cardiovascular:       Per history of present illness  Musculoskeletal: Positive for joint pain (Right hip).  Neurological: Positive for dizziness. Negative for tingling, focal weakness and loss of consciousness.  Psychiatric/Behavioral: Negative for depression and memory loss. The patient is not nervous/anxious.   All other systems reviewed and are negative.   Past Medical History:  Diagnosis Date  . Carotid arterial disease ([place]) 06/2014; 01/09/2015   Followed by Dr. Donnetta Hutching of [facility] -- a) Carotid u/s: 40-59% bilat ICA stenosis; b) Mod 50-69% Bilateral R Int Carotid (heteroenous plaque), Normal L Vert A, unable to find R Vert A.   . 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  MRI Brain: Acute L Occipital Lobe Infarct. Chronic microvascular ischemic changes in the white matter and left pons.  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 Chronic combined systolic and diastolic congestive heart failure, NYHA class 2 ([place]) 05/11/2015 -  05/15/15   a. EF 20-25% with anterior-anteroseptal akinesis (immediately post anterior STEMI).  ; b. 10/10/'16 Echo: EF 35-40% with moderate mid-apical anteroseptal, anterior and apical hypokinesis.  . Coronary artery disease involving native heart 05/11/2015   Cath: 3V CAD  with 90% prox RCA, 80% mid RCA, 80% OM3, (RCA and OM residual treated medically) 100% LAD - PCI to Prox-Mid LAD with Overlapping Promus Premier DES 3.0 x 38 & 3.0 x 16 (post-dilated to 3.5 mm)   . Essential hypertension   . H/O: GI bleed 05/15/2015   s/p Sigmoid Polypectomy 05/04/2015 - prior to STEMI on May 11, 2015; Had GI Bleed following TPA for CVA, while on ASA & Brilinta; Flex Sig - 1. Internal Hemorrhoids, 2. Distal sigmoid polypectomy site with flat [place] status post biopsy and tattoo with 46mL spot over 3 injections 3. Proximal sigmoid polypectomy site seen as well, 4) otherwise normal with no evidence of bleed  . Hyperlipidemia with target LDL less than 70   . Ischemic cardiomyopathy 05/11/2015   EF 40% on cath 05/11/2015 after anterior STEMI, EF 20-25% on echo 05/13/2015; 12/2015 =  EF ~45%.  . Prediabetes Oct 2016   A1C 6.0 in Oct 2016  . ST elevation myocardial infarction (STEMI) involving left anterior descending (LAD) coronary artery with complication ([place]) [id number]   100% Prox LAD - PCI with overlapping DES x 2  . Tobacco abuse    a. 30 yrs - 1.5 ppd. - Quit 05/11/2015  . Vision abnormalities    Right hemianopsia    Past Surgical History:  Procedure Laterality Date  . CARDIAC CATHETERIZATION N/A 05/11/2015   Procedure: Left Heart Cath and Coronary Angiography;  Surgeon: Peter M Martinique, MD;  Location: [facility];  Service: Cardiovascular;  100% pLAD (long lesion). RCA - prox 90%, mid 80%. OM2 & OM3 80% (OM2 & 3 not necessarily PCI targets); EF 35-45% with Anterio HK  . CARDIAC CATHETERIZATION N/A 05/11/2015   Procedure: Coronary Stent Intervention;  Surgeon: Peter M Martinique, MD;  Location: [facility];  Service: Cardiovascular; PCI to Prox-Mid LAD with Overlapping Promus Premier DES 3.0 x 38 & 3.0 x 16 (post-dilated to 3.5 mm)    . FLEXIBLE SIGMOIDOSCOPY N/A 05/18/2015   Procedure: FLEXIBLE SIGMOIDOSCOPY;  Surgeon: Clarene Essex, MD;  Location: [facility];  Service:  Endoscopy;  Laterality: N/A;  . S/P Appendectomy     Age 34  . S/P Inguinal Hernia Repair     In his 20's.  . TRANSTHORACIC ECHOCARDIOGRAM  05/15/2015   Mild LVH, EF 35-40% - Mod HK of mid-apical anteroseptal, anterior & apical walls.  Gr 1 DD. Mild bilateral Atrial Enlargement.  . TRANSTHORACIC ECHOCARDIOGRAM  12/2015   EF up to ~40-45% wiht Anterior-Anteroapical HK.  Mod LV dilation with increased LVEDP.  Marland Kitchen TRANSTHORACIC ECHOCARDIOGRAM  07/2016   EF ~30-35% (in setting of HTN Urgency). - Gr 2 DD.     Current Meds  Medication Sig  . aspirin 81 MG tablet Take 81 mg by mouth daily.  . carvedilol (COREG) 12.5 MG tablet Take 1 tablet (12.5 mg total) by mouth 2 (two) times daily.  . clopidogrel (PLAVIX) 75 MG tablet Take 1 tablet (75 mg total) by mouth daily.  . ferrous sulfate 325 (65 FE) MG tablet Take 325 mg by mouth daily with breakfast.  . furosemide (LASIX) 20 MG tablet Take 1 tablet (20 mg total) by mouth daily.  . hydrALAZINE (APRESOLINE) 10 MG tablet Take 1 tablet (10 mg  total) by mouth 2 (two) times daily.  . isosorbide mononitrate (IMDUR) 30 MG 24 hr tablet Take 0.5 tablets (15 mg total) by mouth daily.  . mirtazapine (REMERON SOL-TAB) 15 MG disintegrating tablet Take 1 tablet (15 mg total) by mouth at bedtime.  . nitroGLYCERIN (NITROSTAT) 0.4 MG SL tablet Place 1 tablet (0.4 mg total) under the tongue every 5 (five) minutes x 3 doses as needed for chest pain.  . pantoprazole (PROTONIX) 40 MG tablet Take 40 mg by mouth daily.   . potassium chloride SA (KLOR-CON M20) 20 MEQ tablet Take 1 tablet (20 mEq total) by mouth every other day.  . rosuvastatin (CRESTOR) 40 MG tablet Take 1 tablet (40 mg total) by mouth daily at 6 PM.    Allergies  Allergen Reactions  . Latex Itching and Rash    Social History   Social History  . Marital status: Married    Spouse name: N/A  . Number of children: 3  . Years of education: N/A   Occupational History  . retired from [employer]  . Smoking status: Former Smoker    Packs/day: 1.00    Years: 30.00    Types: Cigarettes    Quit date: 04/06/2015  . Smokeless tobacco: Never Used     Comment: Quit the day of his STEMI  . Alcohol use No  . Drug use: No  . Sexual activity: Not Asked   Other Topics Concern  . None   Social History Narrative   Lives in [place] with wife. Originally from [place].    family history includes Cancer in his mother; Other in his mother. He was adopted.  Wt Readings from Last 3 Encounters:  09/24/16 87.1 kg (192 lb)  09/03/16 85.7 kg (189 lb)  07/19/16 86 kg (189 lb 9.6 oz)    PHYSICAL EXAM BP 116/64   Pulse (!) 56   Ht 5\' 8"  (1.727 m)   Wt 87.1 kg (192 lb)   BMI 29.19 kg/m  General appearance: alert, cooperative, appears stated age, no distress and borderline obese HEENT: [place]/AT, EOMI, MMM, anicteric sclera Neck: no adenopathy, no carotid bruit and no JVD Lungs: clear to auscultation bilaterally, normal percussion bilaterally and non-labored Heart: regular rate and rhythm, S1& S2 normal, no murmur, click, rub or gallop; non-displaced PMI Abdomen: soft, non-tender; bowel sounds normal; no masses, no organomegaly; mild truncal obesity Extremities: extremities normal, atraumatic, no cyanosis, and trace edema. Bilateral spider telangiectasias with mild varicosities Pulses: 2+ and symmetric;  Skin: mobility and turgor normal with mild venous stasis changes in BLE. Neurologic: Mental status: Alert, oriented, thought content appropriate; Mood seems somewhat flat   Adult ECG Report  Rate: 56 ;  Rhythm: sinus bradycardia and Cannot exclude septal MI, age undetermined; ST and T-wave abnormality consider anterolateral ischemia.  Narrative Interpretation: No significant change.   Other studies Reviewed: Additional studies/ records that were reviewed today include:  Recent Labs:  Lab Results  Component Value Date   CREATININE 1.56 (H) 07/09/2016    BUN 20 07/09/2016   NA 137 07/09/2016   K 3.5 07/09/2016   CL 101 07/09/2016   CO2 29 07/09/2016   Lab Results  Component Value Date   CHOL 109 07/08/2016   HDL 39 (L) 07/08/2016   LDLCALC 54 07/08/2016   TRIG 78 07/08/2016   CHOLHDL 2.8 07/08/2016    ASSESSMENT / PLAN: Problem List Items Addressed This Visit    CAD S/P percutaneous  coronary angioplasty (Chronic)    His LAD was revascularized with stents, and all evaluation today consists suspect that his MI was anterior. He has significant RCA and circumflex disease that we're managing medically. No recurrent anginal symptoms. However if his EF is reduced, we may need to consider reassessing for revascularization. Continue current dose of carvedilol while we are titrating up hydralazine plus or minus nitrate. He is on rosuvastatin as well as aspirin and Plavix.      Cardiomyopathy, ischemic: EF now down to 30-35% from 40-45%. - Primary (Chronic)    Sitting that he is not having any symptoms. He had one episode. I wonder if the reduced EF is secondary to the solid and hypertension presenting. His blood pressure is much better now on it got better after he was given Lasix and nitroglycerin. This would suggest flash pulmonary edema from hypertensive urgency. Now with reduced EF, we need to be more aggressive with his medical management.  He still had a creatinine of 1.56 back in December. I would like to recheck a chemistry panel in order to determine if I can potentially switch him from hydralazine/Imdur to an ARB.   - We'll also check BNP for a new baseline.  For now, I would increase his hydralazine to 25 mg twice a day  -- if unable to switch to ARB, I would then convert from Imdur to Isordil 20 twice a day along with hydralazine   - If he is able to be on ARB, I would then consider potentially switching to [facility] if his EF is still down.   Once he is on stable meds, we can then reassess EF in about 3 months.        Relevant Orders   EKG 12-Lead (Completed)   Brain natriuretic peptide   Basic metabolic panel   ECHOCARDIOGRAM COMPLETE   Essential hypertension (Chronic)    Looks much better now, he is on a stable dose of carvedilol. Plan is to try to convert from hydralazine/nitrate to an ARB based on his renal function. However if his creatinine is still elevated, I would then simply continue to titrate up hydralazine and switch him from Imdur to Isordil (evidence-based)      Relevant Orders   EKG 12-Lead (Completed)   Brain natriuretic peptide   Basic metabolic panel   ECHOCARDIOGRAM COMPLETE   Hyperlipidemia with target LDL less than 70 (Chronic)    Lipids look much better currently on current dose of rosuvastatin.      Hypertensive urgency    Wonder if this has some to do with dietary discretion. We talked about the importance of avoiding salt. If he would have a similar type of episode I told him to take sublingual nitroglycerin and an extra dose of Lasix.      ST elevation myocardial infarction (STEMI) of anterior wall, subsequent episode of care [facility]) (Chronic)    He had a large anterior infarct as indicated by his Myoview and echocardiogram. He seems to be getting along much better with activity. Unfortunately, his EF has dropped       Other Visit Diagnoses    DOE (dyspnea on exertion)       Relevant Orders   EKG 12-Lead (Completed)   Brain natriuretic peptide   Basic metabolic panel   ECHOCARDIOGRAM COMPLETE      Current medicines are reviewed at length with the patient today. (+/- concerns) None The following changes have been made: - See below  Patient Instructions  LABS -  BNP ,BMP -  Anytime of the day  Your physician recommends that you weigh, daily, at the same time every day, and in the same amount of clothing.   Schedule at [facility]  [address]-- April 2018 Your physician has requested that you have an echocardiogram. Echocardiography is a painless  test that uses sound waves to create images of your heart. It provides your doctor with information about the size and shape of your heart and how well your heart's chambers and valves are working. This procedure takes approximately one hour. There are no restrictions for this procedure.  Your physician recommends that you schedule a follow-up appointment in April /MAY 2018 WITH DR [facility] AFTER ECHO.   If you need a refill on your cardiac medications before your next appointment, please call your pharmacy    Studies Ordered:   Orders Placed This Encounter  Procedures  . Brain natriuretic peptide  . Basic metabolic panel  . EKG 12-Lead  . ECHOCARDIOGRAM COMPLETE      Glenetta Hew, M.D., M.S. Interventional Cardiologist   Pager # [phone] Phone # [phone] [address] [place], [place] [address]

## 2016-09-25 LAB — BASIC METABOLIC PANEL
BUN: 31 mg/dL — ABNORMAL HIGH (ref 7–25)
CALCIUM: 9.3 mg/dL (ref 8.6–10.3)
CHLORIDE: 104 mmol/L (ref 98–110)
CO2: 28 mmol/L (ref 20–31)
Creat: 1.51 mg/dL — ABNORMAL HIGH (ref 0.70–1.18)
Glucose, Bld: 88 mg/dL (ref 65–99)
Potassium: 4.3 mmol/L (ref 3.5–5.3)
SODIUM: 141 mmol/L (ref 135–146)

## 2016-09-26 ENCOUNTER — Encounter: Payer: Self-pay | Admitting: Cardiology

## 2016-09-26 NOTE — Assessment & Plan Note (Signed)
He had a large anterior infarct as indicated by his Myoview and echocardiogram. He seems to be getting along much better with activity. Unfortunately, his EF has dropped

## 2016-09-26 NOTE — Assessment & Plan Note (Signed)
Looks much better now, he is on a stable dose of carvedilol. Plan is to try to convert from hydralazine/nitrate to an ARB based on his renal function. However if his creatinine is still elevated, I would then simply continue to titrate up hydralazine and switch him from Imdur to Isordil (evidence-based)

## 2016-09-26 NOTE — Assessment & Plan Note (Signed)
Lipids look much better currently on current dose of rosuvastatin.

## 2016-09-26 NOTE — Assessment & Plan Note (Addendum)
Sitting that he is not having any symptoms. He had one episode. I wonder if the reduced EF is secondary to the solid and hypertension presenting. His blood pressure is much better now on it got better after he was given Lasix and nitroglycerin. This would suggest flash pulmonary edema from hypertensive urgency. Now with reduced EF, we need to be more aggressive with his medical management.  He still had a creatinine of 1.56 back in December. I would like to recheck a chemistry panel in order to determine if I can potentially switch him from hydralazine/Imdur to an ARB.   - We'll also check BNP for a new baseline.  For now, I would increase his hydralazine to 25 mg twice a day  -- if unable to switch to ARB, I would then convert from Imdur to Isordil 20 twice a day along with hydralazine   - If he is able to be on ARB, I would then consider potentially switching to [facility] if his EF is still down.   Once he is on stable meds, we can then reassess EF in about 3 months.

## 2016-09-26 NOTE — Assessment & Plan Note (Signed)
His LAD was revascularized with stents, and all evaluation today consists suspect that his MI was anterior. He has significant RCA and circumflex disease that we're managing medically. No recurrent anginal symptoms. However if his EF is reduced, we may need to consider reassessing for revascularization. Continue current dose of carvedilol while we are titrating up hydralazine plus or minus nitrate. He is on rosuvastatin as well as aspirin and Plavix.

## 2016-09-26 NOTE — Assessment & Plan Note (Signed)
Wonder if this has some to do with dietary discretion. We talked about the importance of avoiding salt. If he would have a similar type of episode I told him to take sublingual nitroglycerin and an extra dose of Lasix.

## 2016-09-27 LAB — BRAIN NATRIURETIC PEPTIDE: BRAIN NATRIURETIC PEPTIDE: 134 pg/mL — AB (ref <100)

## 2016-10-28 DIAGNOSIS — N189 Chronic kidney disease, unspecified: Secondary | ICD-10-CM | POA: Diagnosis not present

## 2016-10-28 DIAGNOSIS — I10 Essential (primary) hypertension: Secondary | ICD-10-CM | POA: Diagnosis not present

## 2016-10-28 DIAGNOSIS — I779 Disorder of arteries and arterioles, unspecified: Secondary | ICD-10-CM | POA: Diagnosis not present

## 2016-10-28 DIAGNOSIS — E785 Hyperlipidemia, unspecified: Secondary | ICD-10-CM | POA: Diagnosis not present

## 2016-11-10 ENCOUNTER — Other Ambulatory Visit: Payer: Self-pay | Admitting: Physician Assistant

## 2016-11-10 ENCOUNTER — Other Ambulatory Visit: Payer: Self-pay | Admitting: Cardiology

## 2016-11-10 DIAGNOSIS — I160 Hypertensive urgency: Secondary | ICD-10-CM

## 2016-11-11 NOTE — Telephone Encounter (Signed)
Refill Request.  

## 2016-11-21 ENCOUNTER — Other Ambulatory Visit: Payer: Self-pay

## 2016-11-21 ENCOUNTER — Ambulatory Visit (HOSPITAL_COMMUNITY): Payer: Medicare Other | Attending: Cardiology

## 2016-11-21 DIAGNOSIS — I255 Ischemic cardiomyopathy: Secondary | ICD-10-CM | POA: Diagnosis not present

## 2016-11-21 DIAGNOSIS — I10 Essential (primary) hypertension: Secondary | ICD-10-CM | POA: Diagnosis not present

## 2016-11-21 DIAGNOSIS — R0609 Other forms of dyspnea: Secondary | ICD-10-CM | POA: Diagnosis not present

## 2016-11-21 DIAGNOSIS — R0600 Dyspnea, unspecified: Secondary | ICD-10-CM

## 2016-11-28 ENCOUNTER — Encounter: Payer: Self-pay | Admitting: Cardiology

## 2016-11-28 ENCOUNTER — Ambulatory Visit (INDEPENDENT_AMBULATORY_CARE_PROVIDER_SITE_OTHER): Payer: Medicare Other | Admitting: Cardiology

## 2016-11-28 VITALS — BP 113/58 | HR 66 | Ht 68.0 in | Wt 191.4 lb

## 2016-11-28 DIAGNOSIS — I255 Ischemic cardiomyopathy: Secondary | ICD-10-CM

## 2016-11-28 DIAGNOSIS — I10 Essential (primary) hypertension: Secondary | ICD-10-CM

## 2016-11-28 DIAGNOSIS — E785 Hyperlipidemia, unspecified: Secondary | ICD-10-CM | POA: Diagnosis not present

## 2016-11-28 DIAGNOSIS — I2510 Atherosclerotic heart disease of native coronary artery without angina pectoris: Secondary | ICD-10-CM

## 2016-11-28 DIAGNOSIS — Z9861 Coronary angioplasty status: Secondary | ICD-10-CM

## 2016-11-28 MED ORDER — LOSARTAN POTASSIUM 25 MG PO TABS: 25.0000 mg | ORAL_TABLET | Freq: Every day | ORAL | 6 refills | Status: DC

## 2016-11-28 NOTE — Progress Notes (Signed)
PCP: [name], MD  Clinic Note: Chief Complaint  Patient presents with  . Follow-up    echo; Pt states no Sx.   . Coronary Artery Disease  . Cardiomyopathy    Ischemic     HPI: Damon Walker is a 71 y.o. male with a PMH below who presents today for Two-month follow-up of CAD-ischemic cardiomyopathy and to discuss results of his echocardiogram. He has history of anterior STEMI status post PCI to the LAD in October 2016 followed by a stroke where her son had coronary artery disease. He has some chronic combined systolic and diastolic heart failure as well as hypertension, hyperlipidemia and stage III C KD.  Recent Hospitalizations: 07/08/2016 admitted for acute on chronic heart failure in the setting of hypertensive emergency. His blood pressure was 231/86 that rapidly improved with nitroglycerin. --> He was then seen in follow-up by Almyra Deforest, PA and was doing fine without any further symptoms. He had some renal insufficiency while in the hospital and was placed on hydralazine/Imdur with plans for outpatient Myoview to evaluate reduced EF of 30-35% with grade 2 diastolic dysfunction noted in December 2017  I last saw Damon Walker  on February 20, and he was doing quite well with no major complaints. Just some mild exertional dyspnea. He isn't as active as he once was, because of his vision issues poststroke.  Studies Reviewed:  - I actually asked Dr. Sallyanne Kuster to review the echocardiogram results from last 3 echoes including the most recent one to determine if it truly was a change in EF. In his opinion, VF was always somewhere around 35-40%. There may been where it was closer to 40% no disorders close to 35%, but he thinks that the true EF is probably somewhere around 35-40% morning the 38% range.  Myoview January 30: EF 30% with large anterior-anteroseptal and apical infarct consistent with LAD infarct. His HIGH RISK because of large infarct. No ischemia.  Echo 11/21/2016: EF  30-35% (per Dr. Sallyanne Kuster - 35-40%). Anterior-anteroapical Akinesis. Gr2 DD w/ high filling pressures. Mild LA dilation. Mild RV dilation.  Interval History: Damon Walker returns today really without too much complaint besides the fact he just doesn't have much energy anymore. He doesn't have any resting or exertional chest tightness or pressure. No real dyspnea with rest or exertion. Trivial edema at the end of the day, but it goes down at the end of the day without diuretic.   No PND, orthopnea or edema.  No palpitations, lightheadedness, dizziness, weakness or syncope/near syncope. No TIA/amaurosis fugax symptoms. No melena, hematochezia, hematuria, or epstaxis. No claudication.  Most of his activity is limited by lack of energy but also significant hip pain. He also doesn't do things because of his vision issues post stroke. There is probably also a component of depression post stroke.  ROS: A comprehensive was performed. Pertinent positives noted above. Otherwise negative if not indicated Review of Systems  Constitutional: Positive for malaise/fatigue.  Eyes:       Post stroke vision issues - no change  Respiratory: Negative for cough and shortness of breath.   Musculoskeletal: Positive for joint pain (Hip).  Endo/Heme/Allergies: Negative for environmental allergies.  Psychiatric/Behavioral: Negative for memory loss (There may be some memory loss, but the wife covers up for many potential concerns). The patient is not nervous/anxious and does not have insomnia.        I suspect some component of anhedonia and lack of motivation. He seems to be tired and always sleeping  All other systems reviewed and are negative.  I have reviewed and (if needed) updated the patient's problem list, medications, allergies, past medical and surgical history, social and family history.  Past Medical History:  Diagnosis Date  . Carotid arterial disease ([place]) 06/2014; 01/09/2015   Followed by Dr. Donnetta Hutching of [facility] -- a)  Carotid u/s: 40-59% bilat ICA stenosis; b) Mod 50-69% Bilateral R Int Carotid (heteroenous plaque), Normal L Vert A, unable to find R Vert A.   . 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  MRI Brain: Acute L Occipital Lobe Infarct. Chronic microvascular ischemic changes in the white matter and left pons.  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 Chronic combined systolic and diastolic congestive heart failure, NYHA class 2 ([place]) 05/11/2015 - 05/15/15   a. EF 20-25% with anterior-anteroseptal akinesis (immediately post anterior STEMI).  ; b. 10/10/'16 Echo: EF 35-40% with moderate mid-apical anteroseptal, anterior and apical hypokinesis.  . Coronary artery disease involving native heart 05/11/2015   Cath: 3V CAD with 90% prox RCA, 80% mid RCA, 80% OM3, (RCA and OM residual treated medically) 100% LAD - PCI to Prox-Mid LAD with Overlapping Promus Premier DES 3.0 x 38 & 3.0 x 16 (post-dilated to 3.5 mm)   . Essential hypertension   . H/O: GI bleed 05/15/2015   s/p Sigmoid Polypectomy 05/04/2015 - prior to STEMI on May 11, 2015; Had GI Bleed following TPA for CVA, while on ASA & Brilinta; Flex Sig - 1. Internal Hemorrhoids, 2. Distal sigmoid polypectomy site with flat [place] status post biopsy and tattoo with 1mL spot over 3 injections 3. Proximal sigmoid polypectomy site seen as well, 4) otherwise normal with no evidence of bleed  . Hyperlipidemia with target LDL less than 70   . Ischemic cardiomyopathy 05/11/2015   EF 40% on cath 05/11/2015 after anterior STEMI, EF 20-25% on echo 05/13/2015; 12/2015 =  EF ~45%.  . Prediabetes Oct 2016   A1C 6.0 in Oct 2016  . ST elevation myocardial infarction (STEMI) involving left anterior descending (LAD) coronary artery with complication ([place]) 57/03/4695   100% Prox LAD - PCI with overlapping DES x 2  . Tobacco abuse    a. 30 yrs - 1.5 ppd. - Quit 05/11/2015    . Vision abnormalities    Right hemianopsia    Past Surgical History:  Procedure Laterality Date  . CARDIAC CATHETERIZATION N/A 05/11/2015   Procedure: Left Heart Cath and Coronary Angiography;  Surgeon: Peter M Martinique, MD;  Location: [facility];  Service: Cardiovascular;  100% pLAD (long lesion). RCA - prox 90%, mid 80%. OM2 & OM3 80% (OM2 & 3 not necessarily PCI targets); EF 35-45% with Anterio HK  . CARDIAC CATHETERIZATION N/A 05/11/2015   Procedure: Coronary Stent Intervention;  Surgeon: Peter M Martinique, MD;  Location: [facility];  Service: Cardiovascular; PCI to Prox-Mid LAD with Overlapping Promus Premier DES 3.0 x 38 & 3.0 x 16 (post-dilated to 3.5 mm)    . FLEXIBLE SIGMOIDOSCOPY N/A 05/18/2015   Procedure: FLEXIBLE SIGMOIDOSCOPY;  Surgeon: Clarene Essex, MD;  Location: [facility];  Service: Endoscopy;  Laterality: N/A;  . NM MYOVIEW LTD  08/2016   EF 30% with large anterior-anteroseptal and apical infarct consistent with LAD infarct. His HIGH RISK because of large infarct. No ischemia.  . S/P Appendectomy     Age 51  . S/P Inguinal Hernia Repair  In his 40's.  . TRANSTHORACIC ECHOCARDIOGRAM  05/15/2015   Mild LVH, EF 35-40% - Mod HK of mid-apical anteroseptal, anterior & apical walls.  Gr 1 DD. Mild bilateral Atrial Enlargement.  . TRANSTHORACIC ECHOCARDIOGRAM  12/2015   EF up to ~40-45% wiht Anterior-Anteroapical HK.  Mod LV dilation with increased LVEDP.  Marland Kitchen TRANSTHORACIC ECHOCARDIOGRAM  12/'17; 4/'18   a) EF ~30-35% (in setting of HTN Urgency). - Gr 2 DD;; b) EF 30-35% (per Dr. Sallyanne Kuster - 35-40%). Anterior-anteroapical Akinesis. Gr2 DD w/ high filling pressures. Mild LA dilation. Mild RV dilation.    Current Meds  Medication Sig  . aspirin 81 MG tablet Take 81 mg by mouth daily.  . carvedilol (COREG) 12.5 MG tablet TAKE ONE TABLET BY MOUTH TWICE DAILY  . clopidogrel (PLAVIX) 75 MG tablet Take 1 tablet (75 mg total) by mouth daily.  . ferrous sulfate 325 (65  FE) MG tablet Take 325 mg by mouth daily with breakfast.  . furosemide (LASIX) 40 MG tablet Take 40 mg by mouth daily.  . isosorbide mononitrate (IMDUR) 30 MG 24 hr tablet Take 0.5 tablets (15 mg total) by mouth daily.  . mirtazapine (REMERON SOL-TAB) 15 MG disintegrating tablet Take 1 tablet (15 mg total) by mouth at bedtime.  . nitroGLYCERIN (NITROSTAT) 0.4 MG SL tablet Place 1 tablet (0.4 mg total) under the tongue every 5 (five) minutes x 3 doses as needed for chest pain.  . pantoprazole (PROTONIX) 40 MG tablet Take 40 mg by mouth daily.   . potassium chloride SA (KLOR-CON M20) 20 MEQ tablet Take 1 tablet (20 mEq total) by mouth every other day.  . rosuvastatin (CRESTOR) 40 MG tablet Take 1 tablet (40 mg total) by mouth daily at 6 PM.  . [DISCONTINUED] hydrALAZINE (APRESOLINE) 10 MG tablet TAKE ONE TABLET BY MOUTH TWICE DAILY    Allergies  Allergen Reactions  . Latex Itching and Rash    Social History   Social History  . Marital status: Married    Spouse name: N/A  . Number of children: 3  . Years of education: N/A   Occupational History  . retired from [employer]  . Smoking status: Former Smoker    Packs/day: 1.00    Years: 30.00    Types: Cigarettes    Quit date: 04/06/2015  . Smokeless tobacco: Never Used     Comment: Quit the day of his STEMI  . Alcohol use No  . Drug use: No  . Sexual activity: Not Asked   Other Topics Concern  . None   Social History Narrative   Lives in [place] with wife. Originally from [place].    family history includes Cancer in his mother; Other in his mother. He was adopted.  Wt Readings from Last 3 Encounters:  11/28/16 191 lb 6.4 oz (86.8 kg)  09/24/16 192 lb (87.1 kg)  09/03/16 189 lb (85.7 kg)    PHYSICAL EXAM BP (!) 113/58   Pulse 66   Ht 5\' 8"  (1.727 m)   Wt 191 lb 6.4 oz (86.8 kg)   BMI 29.10 kg/m  General appearance: alert, cooperative, appears stated age, no distress and Borderline  obese. Well groomed. Well-nourished. HEENT: [place]/AT, MMM, anicteric sclera Neck: no adenopathy, no carotid bruit and no JVD Lungs: clear to auscultation bilaterally, normal percussion bilaterally and non-labored Heart: regular rate and rhythm, S1& S2 normal, no murmur, click, rub or gallop; nondisplaced PMI Abdomen: soft, non-tender; bowel sounds normal;  no masses,  no organomegaly; no HJR Extremities: extremities normal, atraumatic, no cyanosis, and edema  - trivial with mild varicosities & bilateral spider telangectasias. Pulses: 2+ and symmetric;  Skin: mobility and turgor normal, no lesions noted and texture normal  Neurologic: Mental status: Alert, oriented, thought content appropriate; Pleasant mood & infarct.   Adult ECG Report n/a  Other studies Reviewed: Additional studies/ records that were reviewed today include:  Recent Labs:   Lab Results  Component Value Date   CHOL 109 07/08/2016   HDL 39 (L) 07/08/2016   LDLCALC 54 07/08/2016   TRIG 78 07/08/2016   CHOLHDL 2.8 07/08/2016    ASSESSMENT / PLAN: Problem List Items Addressed This Visit    CAD S/P percutaneous coronary angioplasty (Chronic)    Large anterior MI with LAD stents. Unfortunately it looks like most of that distribution is completely infarcted. We are still managing his RCA and circumflex disease medically. As he has had no recurrent anginal symptoms and no evidence of ischemia on his stress test, I don't see any reason to pursue revascularization besides the fact he has persistently reduced EF. We'll need to review Films to determine if there is potentially option. He remains on aspirin and Plavix as well as stable dose of carvedilol. He is on Crestor. Was started on low-dose hydralazine isosorbide mononitrate combination for afterload reduction -- which I think we can probably convert now to ARB.      Relevant Medications   furosemide (LASIX) 40 MG tablet   losartan (COZAAR) 25 MG tablet   Cardiomyopathy,  ischemic: EF most consistently 35-40% - Primary (Chronic)    Large anterior infarct with reduced EF. I asked Dr. Sallyanne Kuster to come by and talk to the patient about ICD. He spent at least 10 minutes with the patient and his wife discussing ICD placement, risks benefits alternatives etc. Many questions were asked and answered.  Would like to continue to titrate his medications and hopefully potentially end up with Entresto. The plan for now will be to discontinue hydralazine and switch for losartan 25 mg. I will keep the Imdur for antianginal effect. - He is also on stable dose of Lasix which he takes daily and then additional dose when necessary. If he is able tolerate losartan as can be determined and is close follow-up appointment with Almyra Deforest in 3 months, we can see if we can potentially start low-dose Entresto. (We'll preemptively forward to our pharmacy team to see if there is any potential concerns).  Also plan to review Films to see if there are any PCI targets that would potentially help his EF.      Relevant Medications   furosemide (LASIX) 40 MG tablet   losartan (COZAAR) 25 MG tablet   Essential hypertension (Chronic)    [place] had hypertensive urgency this past December. This has not been an issue for him. He may just been volume overloaded. He is on good dose of carvedilol and on minimal dose of hydralazine. Plan is to DC hydralazine and start losartan 25 mg. Hopefully convert to [facility] at follow-up.      Relevant Medications   furosemide (LASIX) 40 MG tablet   losartan (COZAAR) 25 MG tablet   Hyperlipidemia with target LDL less than 70 (Chronic)    Lipids look well controlled in December on Crestor. Recheck labs roughly in December.      Relevant Medications   furosemide (LASIX) 40 MG tablet   losartan (COZAAR) 25 MG tablet  Current medicines are reviewed at length with the patient today. (+/- concerns) n/a The following changes have been made: see  below  Patient Instructions  MEDICATION -- STOP HYDRALAZINE    -START LOSARTAN 25 MG -- ONE TABLET DAILY    DR [place] today--call back with your decision   Your physician wants you to follow-up in  ( January 30 2017 at 3:30 pm) 3 MONTHS WITH HAO MENG PA.    Cardioverter Defibrillator Implantation An implantable cardioverter defibrillator (ICD) is a small device that is placed under the skin in the chest or abdomen. An ICD consists of a battery, a small computer (pulse generator), and wires (leads) that go into the heart. An ICD is used to detect and correct two types of dangerous irregular heartbeats (arrhythmias):  A rapid heart rhythm (tachycardia).  An arrhythmia in which the lower chambers of the heart (ventricles) contract in an uncoordinated way (fibrillation). When an ICD detects tachycardia, it sends a low-energy shock to the heart to restore the heartbeat to normal (cardioversion). This signal is usually painless. If cardioversion does not work or if the ICD detects fibrillation, it delivers a high-energy shock to the heart (defibrillation) to restart the heart. This shock may feel like a strong jolt in the chest. Your health care provider may prescribe an ICD if:  You have had an arrhythmia that originated in the ventricles.  Your heart has been damaged by a disease or heart condition. Sometimes, ICDs are programmed to act as a device called a pacemaker. Pacemakers can be used to treat a slow heartbeat (bradycardia) or tachycardia by taking over the heart rate with electrical impulses. Tell a health care provider about:  Any allergies you have.  All medicines you are taking, including vitamins, herbs, eye drops, creams, and over-the-counter medicines.  Any problems you or family members have had with anesthetic medicines.  Any blood disorders you have.  Any surgeries you have had.  Any medical conditions you have.  Whether you are  pregnant or may be pregnant. What are the risks? Generally, this is a safe procedure. However, problems may occur, including:  Swelling, bleeding, or bruising.  Infection.  Blood clots.  Damage to other structures or organs, such as nerves, blood vessels, or the heart.  Allergic reactions to medicines used during the procedure. What happens before the procedure? Staying hydrated  Follow instructions from your health care provider about hydration, which may include:  Up to 2 hours before the procedure - you may continue to drink clear liquids, such as water, clear fruit juice, black coffee, and plain tea. Eating and drinking restrictions  Follow instructions from your health care provider about eating and drinking, which may include:  8 hours before the procedure - stop eating heavy meals or foods such as meat, fried foods, or fatty foods.  6 hours before the procedure - stop eating light meals or foods, such as toast or cereal.  6 hours before the procedure - stop drinking milk or drinks that contain milk.  2 hours before the procedure - stop drinking clear liquids. Medicine  Ask your health care provider about:  Changing or stopping your normal medicines. This is important if you take diabetes medicines or blood thinners.  Taking medicines such as aspirin and ibuprofen. These medicines can thin your blood. Do not take these medicines before your procedure if your doctor tells you not to. Tests   You may have blood tests.  You may have a test to check the electrical signals in your heart (electrocardiogram, ECG).  You may have imaging tests, such as a chest X-ray. General instructions   For 24 hours before the procedure, stop using products that contain nicotine or tobacco, such as cigarettes and e-cigarettes. If you need help quitting, ask your health care provider.  Plan to have someone take you home from the hospital or clinic.  You may be asked to shower with a  germ-killing soap. What happens during the procedure?  To reduce your risk of infection:  Your health care team will wash or sanitize their hands.  Your skin will be washed with soap.  Hair may be removed from the surgical area.  Small monitors will be put on your body. They will be used to check your heart, blood pressure, and oxygen level.  An IV tube will be inserted into one of your veins.  You will be given one or more of the following:  A medicine to help you relax (sedative).  A medicine to numb the area (local anesthetic).  A medicine to make you fall asleep (general anesthetic).  Leads will be guided through a blood vessel into your heart and attached to your heart muscles. Depending on the ICD, the leads may go into one ventricle or they may go into both ventricles and into an upper chamber of the heart. An X-ray machine (fluoroscope) will be usedto help guide the leads.  A small incision will be made to create a deep pocket under your skin.  The pulse generator will be placed into the pocket.  The ICD will be tested.  The incision will be closed with stitches (sutures), skin glue, or staples.  A bandage (dressing) will be placed over the incision. This procedure may vary among health care providers and hospitals. What happens after the procedure?  Your blood pressure, heart rate, breathing rate, and blood oxygen level will be monitored often until the medicines you were given have worn off.  A chest X-ray will be taken to check that the ICD is in the right place.  You will need to stay in the hospital for 1-2 days so your health care provider can make sure your ICD is working.  Do not drive for 24 hours if you received a sedative. Ask your health care provider when it is safe for you to drive.  You may be given an identification card explaining that you have an ICD. Summary  An implantable cardioverter defibrillator (ICD) is a small device that is placed  under the skin in the chest or abdomen. It is used to detect and correct dangerous irregular heartbeats (arrhythmias).  An ICD consists of a battery, a small computer (pulse generator), and wires (leads) that go into the heart.  When an ICD detects rapid heart rhythm (tachycardia), it sends a low-energy shock to the heart to restore the heartbeat to normal (cardioversion). If cardioversion does not work or if the ICD detects uncoordinated heart contractions (fibrillation), it delivers a high-energy shock to the heart (defibrillation) to restart the heart.  You will need to stay in the hospital for 1-2 days to make sure your ICD is working. This information is not intended to replace advice given to you by your health care provider. Make sure you discuss any questions you have with your health care provider. Document Released: 04/13/2002 Document Revised: 07/31/2016 Document Reviewed: 07/31/2016 Elsevier Interactive Patient Education  2017 [employer].    Studies Ordered:  No orders of the defined types were placed in this encounter.     Glenetta Hew, M.D., M.S. Interventional Cardiologist   Pager # [phone] Phone # [phone] [address] [place], [place] [address]

## 2016-11-28 NOTE — Patient Instructions (Addendum)
MEDICATION -- STOP HYDRALAZINE    -START LOSARTAN 25 MG -- ONE TABLET DAILY    DR Sallyanne Kuster DISCUSS DEFIBRILLATOR IMPLANT today--call back with your decision   Your physician wants you to follow-up in  ( January 30 2017 at 3:30 pm) 3 MONTHS WITH HAO MENG PA.    Cardioverter Defibrillator Implantation An implantable cardioverter defibrillator (ICD) is a small device that is placed under the skin in the chest or abdomen. An ICD consists of a battery, a small computer (pulse generator), and wires (leads) that go into the heart. An ICD is used to detect and correct two types of dangerous irregular heartbeats (arrhythmias):  A rapid heart rhythm (tachycardia).  An arrhythmia in which the lower chambers of the heart (ventricles) contract in an uncoordinated way (fibrillation). When an ICD detects tachycardia, it sends a low-energy shock to the heart to restore the heartbeat to normal (cardioversion). This signal is usually painless. If cardioversion does not work or if the ICD detects fibrillation, it delivers a high-energy shock to the heart (defibrillation) to restart the heart. This shock may feel like a strong jolt in the chest. Your health care provider may prescribe an ICD if:  You have had an arrhythmia that originated in the ventricles.  Your heart has been damaged by a disease or heart condition. Sometimes, ICDs are programmed to act as a device called a pacemaker. Pacemakers can be used to treat a slow heartbeat (bradycardia) or tachycardia by taking over the heart rate with electrical impulses. Tell a health care provider about:  Any allergies you have.  All medicines you are taking, including vitamins, herbs, eye drops, creams, and over-the-counter medicines.  Any problems you or family members have had with anesthetic medicines.  Any blood disorders you have.  Any surgeries you have had.  Any medical conditions you have.  Whether you are pregnant or may be  pregnant. What are the risks? Generally, this is a safe procedure. However, problems may occur, including:  Swelling, bleeding, or bruising.  Infection.  Blood clots.  Damage to other structures or organs, such as nerves, blood vessels, or the heart.  Allergic reactions to medicines used during the procedure. What happens before the procedure? Staying hydrated  Follow instructions from your health care provider about hydration, which may include:  Up to 2 hours before the procedure - you may continue to drink clear liquids, such as water, clear fruit juice, black coffee, and plain tea. Eating and drinking restrictions  Follow instructions from your health care provider about eating and drinking, which may include:  8 hours before the procedure - stop eating heavy meals or foods such as meat, fried foods, or fatty foods.  6 hours before the procedure - stop eating light meals or foods, such as toast or cereal.  6 hours before the procedure - stop drinking milk or drinks that contain milk.  2 hours before the procedure - stop drinking clear liquids. Medicine  Ask your health care provider about:  Changing or stopping your normal medicines. This is important if you take diabetes medicines or blood thinners.  Taking medicines such as aspirin and ibuprofen. These medicines can thin your blood. Do not take these medicines before your procedure if your doctor tells you not to. Tests   You may have blood tests.  You may have a test to check the electrical signals in your heart (electrocardiogram, ECG).  You may have imaging tests, such as a chest X-ray.  General instructions   For 24 hours before the procedure, stop using products that contain nicotine or tobacco, such as cigarettes and e-cigarettes. If you need help quitting, ask your health care provider.  Plan to have someone take you home from the hospital or clinic.  You may be asked to shower with a germ-killing  soap. What happens during the procedure?  To reduce your risk of infection:  Your health care team will wash or sanitize their hands.  Your skin will be washed with soap.  Hair may be removed from the surgical area.  Small monitors will be put on your body. They will be used to check your heart, blood pressure, and oxygen level.  An IV tube will be inserted into one of your veins.  You will be given one or more of the following:  A medicine to help you relax (sedative).  A medicine to numb the area (local anesthetic).  A medicine to make you fall asleep (general anesthetic).  Leads will be guided through a blood vessel into your heart and attached to your heart muscles. Depending on the ICD, the leads may go into one ventricle or they may go into both ventricles and into an upper chamber of the heart. An X-ray machine (fluoroscope) will be usedto help guide the leads.  A small incision will be made to create a deep pocket under your skin.  The pulse generator will be placed into the pocket.  The ICD will be tested.  The incision will be closed with stitches (sutures), skin glue, or staples.  A bandage (dressing) will be placed over the incision. This procedure may vary among health care providers and hospitals. What happens after the procedure?  Your blood pressure, heart rate, breathing rate, and blood oxygen level will be monitored often until the medicines you were given have worn off.  A chest X-ray will be taken to check that the ICD is in the right place.  You will need to stay in the hospital for 1-2 days so your health care provider can make sure your ICD is working.  Do not drive for 24 hours if you received a sedative. Ask your health care provider when it is safe for you to drive.  You may be given an identification card explaining that you have an ICD. Summary  An implantable cardioverter defibrillator (ICD) is a small device that is placed under the skin  in the chest or abdomen. It is used to detect and correct dangerous irregular heartbeats (arrhythmias).  An ICD consists of a battery, a small computer (pulse generator), and wires (leads) that go into the heart.  When an ICD detects rapid heart rhythm (tachycardia), it sends a low-energy shock to the heart to restore the heartbeat to normal (cardioversion). If cardioversion does not work or if the ICD detects uncoordinated heart contractions (fibrillation), it delivers a high-energy shock to the heart (defibrillation) to restart the heart.  You will need to stay in the hospital for 1-2 days to make sure your ICD is working. This information is not intended to replace advice given to you by your health care provider. Make sure you discuss any questions you have with your health care provider. Document Released: 04/13/2002 Document Revised: 07/31/2016 Document Reviewed: 07/31/2016 Elsevier Interactive Patient Education  2017 [employer].

## 2016-11-30 ENCOUNTER — Encounter: Payer: Self-pay | Admitting: Cardiology

## 2016-11-30 NOTE — Assessment & Plan Note (Signed)
[place] had hypertensive urgency this past December. This has not been an issue for him. He may just been volume overloaded. He is on good dose of carvedilol and on minimal dose of hydralazine. Plan is to DC hydralazine and start losartan 25 mg. Hopefully convert to [facility] at follow-up.

## 2016-11-30 NOTE — Assessment & Plan Note (Signed)
Large anterior MI with LAD stents. Unfortunately it looks like most of that distribution is completely infarcted. We are still managing his RCA and circumflex disease medically. As he has had no recurrent anginal symptoms and no evidence of ischemia on his stress test, I don't see any reason to pursue revascularization besides the fact he has persistently reduced EF. We'll need to review Films to determine if there is potentially option. He remains on aspirin and Plavix as well as stable dose of carvedilol. He is on Crestor. Was started on low-dose hydralazine isosorbide mononitrate combination for afterload reduction -- which I think we can probably convert now to ARB.

## 2016-11-30 NOTE — Assessment & Plan Note (Addendum)
Large anterior infarct with reduced EF. I asked Dr. Sallyanne Kuster to come by and talk to the patient about ICD. He spent at least 10 minutes with the patient and his wife discussing ICD placement, risks benefits alternatives etc. Many questions were asked and answered.  Would like to continue to titrate his medications and hopefully potentially end up with Entresto. The plan for now will be to discontinue hydralazine and switch for losartan 25 mg. I will keep the Imdur for antianginal effect. - He is also on stable dose of Lasix which he takes daily and then additional dose when necessary. If he is able tolerate losartan as can be determined and is close follow-up appointment with Almyra Deforest in 3 months, we can see if we can potentially start low-dose Entresto. (We'll preemptively forward to our pharmacy team to see if there is any potential concerns).  Also plan to review Films to see if there are any PCI targets that would potentially help his EF.

## 2016-11-30 NOTE — Assessment & Plan Note (Signed)
Lipids look well controlled in December on Crestor. Recheck labs roughly in December.

## 2017-01-22 DIAGNOSIS — N189 Chronic kidney disease, unspecified: Secondary | ICD-10-CM | POA: Diagnosis not present

## 2017-01-22 DIAGNOSIS — I10 Essential (primary) hypertension: Secondary | ICD-10-CM | POA: Diagnosis not present

## 2017-01-22 DIAGNOSIS — I779 Disorder of arteries and arterioles, unspecified: Secondary | ICD-10-CM | POA: Diagnosis not present

## 2017-01-22 DIAGNOSIS — E785 Hyperlipidemia, unspecified: Secondary | ICD-10-CM | POA: Diagnosis not present

## 2017-01-30 ENCOUNTER — Ambulatory Visit (INDEPENDENT_AMBULATORY_CARE_PROVIDER_SITE_OTHER): Payer: Medicare Other | Admitting: Physician Assistant

## 2017-01-30 ENCOUNTER — Encounter: Payer: Self-pay | Admitting: Physician Assistant

## 2017-01-30 VITALS — BP 100/58 | HR 66 | Ht 68.0 in | Wt 188.0 lb

## 2017-01-30 DIAGNOSIS — I2510 Atherosclerotic heart disease of native coronary artery without angina pectoris: Secondary | ICD-10-CM

## 2017-01-30 DIAGNOSIS — I255 Ischemic cardiomyopathy: Secondary | ICD-10-CM

## 2017-01-30 DIAGNOSIS — N183 Chronic kidney disease, stage 3 unspecified: Secondary | ICD-10-CM

## 2017-01-30 DIAGNOSIS — I5042 Chronic combined systolic (congestive) and diastolic (congestive) heart failure: Secondary | ICD-10-CM | POA: Diagnosis not present

## 2017-01-30 DIAGNOSIS — E785 Hyperlipidemia, unspecified: Secondary | ICD-10-CM | POA: Diagnosis not present

## 2017-01-30 DIAGNOSIS — I10 Essential (primary) hypertension: Secondary | ICD-10-CM

## 2017-01-30 DIAGNOSIS — I420 Dilated cardiomyopathy: Secondary | ICD-10-CM

## 2017-01-30 DIAGNOSIS — Z79899 Other long term (current) drug therapy: Secondary | ICD-10-CM | POA: Diagnosis not present

## 2017-01-30 MED ORDER — FUROSEMIDE 20 MG PO TABS: 20.0000 mg | ORAL_TABLET | Freq: Every day | ORAL | 5 refills | Status: DC

## 2017-01-30 MED ORDER — SACUBITRIL-VALSARTAN 24-26 MG PO TABS: 1.0000 | ORAL_TABLET | Freq: Two times a day (BID) | ORAL | 0 refills | Status: DC

## 2017-01-30 NOTE — Patient Instructions (Signed)
Medication Instructions:  STOP LOSARTAN START ENTRESTO 24/26MG  TWICE DAILY FOR ONE MONTH  DECREASE LASIX TO 20MG  DAILY-OK TO TAKE 20MG  AS NEEDED FOR SWELLING  If you need a refill on your cardiac medications before your next appointment, please call your pharmacy.  Labwork: BMP TOMORROW AND IN ONE WEEK HERE IN OUR OFFICE AT [facility]  Follow-Up: Your physician wants you to follow-up in: 1 [place], PA-C    Thank you for choosing [facility] at [employer]!!

## 2017-01-30 NOTE — Progress Notes (Signed)
Cardiology Office Note    Date:  01/31/2017   ID:  Jc Veron, DOB 09-Jul-1946, MRN [id number]  PCP:  [name], MD  Cardiologist:  Dr. Ellyn Hack  Chief Complaint  Patient presents with  . Follow-up    pt has no complaints today     History of Present Illness:  Damon Walker is a 71 y.o. male  with PMH of carotid artery disease, coronary artery disease s/p stent to LAD in October 2016, CVA, stage III CKD, chronic combined systolic and diastolic heart failure, hypertension, hyperlipidemia and ischemic cardiomyopathy. Patient was most recently admitted on the 07/08/2016 for shortness of breath. Repeat echocardiogram obtained on 07/08/2016 showed EF now down to 30-35%, mild LVH, grade 2 diastolic dysfunction. Review of the previous echocardiogram showed his EF reached a nadir of 20-25% in October 2016. However later improved to 40-45% in May 2017 before decreasing down to 30-35% on most recent echocardiogram. I last saw the patient on 07/19/2016, due to his renal function, I did not add ACE inhibitor or ARB, however I did add low-dose Imdur and hydralazine for his LV dysfunction. After discussing the case with Dr. Ellyn Hack, he underwent Myoview on 09/03/2016, this showed EF 33%, high risks study with area consistent with prior MI in the mid anterior, mid anteroseptal, apical anterior, apical septal, apical lateral and apex location, there was only minimal peri-infarct ischemia. A Myoview was reviewed by Dr. Ellyn Hack, given lack of chest discomfort, he recommended medical therapy.  Patient presents to cardiology office visit today. He has no recent chest pain or shortness of breath. He denies any lower extremity edema on current 40 mg daily Lasix. I plan to discontinue his losartan and switch him to [facility] 24/26. Patient and family is concerned about cost, I'll check with our clinical pharmacist to see if there is any medication assistance program. We have given him coupons for 1 month free  supply of Entresto. I will bring him back in one month for further medication titration. He has occasional dizziness, I think is related to his blood pressure. His blood pressure is borderline low today. Hopefully he is able to tolerate the lowest possible dose of Entresto. He will need a basic metabolic panel today and also in one week.   Past Medical History:  Diagnosis Date  . Carotid arterial disease ([place]) 06/2014; 01/09/2015   Followed by Dr. Donnetta Hutching of [facility] -- a) Carotid u/s: 40-59% bilat ICA stenosis; b) Mod 50-69% Bilateral R Int Carotid (heteroenous plaque), Normal L Vert A, unable to find R Vert A.   . 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 ([place]) 05/14/2015   -- Given TPA.  MRI Brain: Acute L Occipital Lobe Infarct. Chronic microvascular ischemic changes in the white matter and left pons.  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 Chronic combined systolic and diastolic congestive heart failure, NYHA class 2 ([place]) 05/11/2015 - 05/15/15   a. EF 20-25% with anterior-anteroseptal akinesis (immediately post anterior STEMI).  ; b. 10/10/'16 Echo: EF 35-40% with moderate mid-apical anteroseptal, anterior and apical hypokinesis.  . Coronary artery disease involving native heart 05/11/2015   Cath: 3V CAD with 90% prox RCA, 80% mid RCA, 80% OM3, (RCA and OM residual treated medically) 100% LAD - PCI to Prox-Mid LAD with Overlapping Promus Premier DES 3.0 x 38 & 3.0 x 16 (post-dilated to 3.5 mm)   . Essential hypertension   . H/O: GI bleed 05/15/2015  s/p Sigmoid Polypectomy 05/04/2015 - prior to STEMI on May 11, 2015; Had GI Bleed following TPA for CVA, while on ASA & Brilinta; Flex Sig - 1. Internal Hemorrhoids, 2. Distal sigmoid polypectomy site with flat White[place] status post biopsy and tattoo with 71mL spot over 3 injections 3. Proximal sigmoid polypectomy site seen as well, 4) otherwise normal with no evidence of bleed  .  Hyperlipidemia with target LDL less than 70   . Ischemic cardiomyopathy 05/11/2015   EF 40% on cath 05/11/2015 after anterior STEMI, EF 20-25% on echo 05/13/2015; 12/2015 =  EF ~45%.  . Prediabetes Oct 2016   A1C 6.0 in Oct 2016  . ST elevation myocardial infarction (STEMI) involving left anterior descending (LAD) coronary artery with complication ([place]) 89/10/8099   100% Prox LAD - PCI with overlapping DES x 2  . Tobacco abuse    a. 30 yrs - 1.5 ppd. - Quit 05/11/2015  . Vision abnormalities    Right hemianopsia    Past Surgical History:  Procedure Laterality Date  . CARDIAC CATHETERIZATION N/A 05/11/2015   Procedure: Left Heart Cath and Coronary Angiography;  Surgeon: Peter M Martinique, MD;  Location: [facility];  Service: Cardiovascular;  100% pLAD (long lesion). RCA - prox 90%, mid 80%. OM2 & OM3 80% (OM2 & 3 not necessarily PCI targets); EF 35-45% with Anterio HK  . CARDIAC CATHETERIZATION N/A 05/11/2015   Procedure: Coronary Stent Intervention;  Surgeon: Peter M Martinique, MD;  Location: [facility];  Service: Cardiovascular; PCI to Prox-Mid LAD with Overlapping Promus Premier DES 3.0 x 38 & 3.0 x 16 (post-dilated to 3.5 mm)    . FLEXIBLE SIGMOIDOSCOPY N/A 05/18/2015   Procedure: FLEXIBLE SIGMOIDOSCOPY;  Surgeon: Clarene Essex, MD;  Location: [facility];  Service: Endoscopy;  Laterality: N/A;  . NM MYOVIEW LTD  08/2016   EF 30% with large anterior-anteroseptal and apical infarct consistent with LAD infarct. His HIGH RISK because of large infarct. No ischemia.  . S/P Appendectomy     Age 48  . S/P Inguinal Hernia Repair     In his 20's.  . TRANSTHORACIC ECHOCARDIOGRAM  05/15/2015   Mild LVH, EF 35-40% - Mod HK of mid-apical anteroseptal, anterior & apical walls.  Gr 1 DD. Mild bilateral Atrial Enlargement.  . TRANSTHORACIC ECHOCARDIOGRAM  12/2015   EF up to ~40-45% wiht Anterior-Anteroapical HK.  Mod LV dilation with increased LVEDP.  Marland Kitchen TRANSTHORACIC ECHOCARDIOGRAM  12/'17; 4/'18     a) EF ~30-35% (in setting of HTN Urgency). - Gr 2 DD;; b) EF 30-35% (per Dr. Sallyanne Kuster - 35-40%). Anterior-anteroapical Akinesis. Gr2 DD w/ high filling pressures. Mild LA dilation. Mild RV dilation.    Current Medications: Outpatient Medications Prior to Visit  Medication Sig Dispense Refill  . aspirin 81 MG tablet Take 81 mg by mouth daily.    . carvedilol (COREG) 12.5 MG tablet TAKE ONE TABLET BY MOUTH TWICE DAILY 180 tablet 3  . clopidogrel (PLAVIX) 75 MG tablet Take 1 tablet (75 mg total) by mouth daily. 30 tablet 1  . ferrous sulfate 325 (65 FE) MG tablet Take 325 mg by mouth daily with breakfast.    . isosorbide mononitrate (IMDUR) 30 MG 24 hr tablet Take 0.5 tablets (15 mg total) by mouth daily. 30 tablet 3  . mirtazapine (REMERON SOL-TAB) 15 MG disintegrating tablet Take 1 tablet (15 mg total) by mouth at bedtime. 30 tablet 1  . nitroGLYCERIN (NITROSTAT) 0.4 MG SL tablet Place 1 tablet (0.4  mg total) under the tongue every 5 (five) minutes x 3 doses as needed for chest pain. 25 tablet 3  . pantoprazole (PROTONIX) 40 MG tablet Take 40 mg by mouth daily.     . potassium chloride SA (KLOR-CON M20) 20 MEQ tablet Take 1 tablet (20 mEq total) by mouth every other day. 30 tablet 8  . rosuvastatin (CRESTOR) 40 MG tablet Take 1 tablet (40 mg total) by mouth daily at 6 PM. 90 tablet 3  . furosemide (LASIX) 40 MG tablet Take 40 mg by mouth daily.    Marland Kitchen losartan (COZAAR) 25 MG tablet Take 1 tablet (25 mg total) by mouth daily. 30 tablet 6   No facility-administered medications prior to visit.      Allergies:   Latex   Social History   Social History  . Marital status: Married    Spouse name: N/A  . Number of children: 3  . Years of education: N/A   Occupational History  . retired from [employer]  . Smoking status: Former Smoker    Packs/day: 1.00    Years: 30.00    Types: Cigarettes    Quit date: 04/06/2015  . Smokeless tobacco: Never Used      Comment: Quit the day of his STEMI  . Alcohol use No  . Drug use: No  . Sexual activity: Not Asked   Other Topics Concern  . None   Social History Narrative   Lives in [place] with wife. Originally from [place].     Family History:  The patient's family history includes Cancer in his mother; Other in his mother. He was adopted.   ROS:   Please see the history of present illness.    ROS All other systems reviewed and are negative.   PHYSICAL EXAM:   VS:  BP (!) 100/58 (BP Location: Right Arm, Patient Position: Sitting, Cuff Size: Normal)   Pulse 66   Ht 5\' 8"  (1.727 m)   Wt 188 lb (85.3 kg)   BMI 28.59 kg/m    GEN: Well nourished, well developed, in no acute distress  HEENT: normal  Neck: no JVD, carotid bruits, or masses Cardiac: RRR; no murmurs, rubs, or gallops,no edema  Respiratory:  clear to auscultation bilaterally, normal work of breathing GI: soft, nontender, nondistended, + BS MS: no deformity or atrophy  Skin: warm and dry, no rash Neuro:  Alert and Oriented x 3, Strength and sensation are intact Psych: euthymic mood, full affect  Wt Readings from Last 3 Encounters:  01/30/17 188 lb (85.3 kg)  11/28/16 191 lb 6.4 oz (86.8 kg)  09/24/16 192 lb (87.1 kg)      Studies/Labs Reviewed:   EKG:  EKG is not ordered today.    Recent Labs: 07/08/2016: ALT 24 07/09/2016: Hemoglobin 11.7; Platelets 216 09/24/2016: Brain Natriuretic Peptide 134; BUN 31; Creat 1.51; Potassium 4.3; Sodium 141   Lipid Panel    Component Value Date/Time   CHOL 109 07/08/2016 0451   TRIG 78 07/08/2016 0451   HDL 39 (L) 07/08/2016 0451   CHOLHDL 2.8 07/08/2016 0451   VLDL 16 07/08/2016 0451   LDLCALC 54 07/08/2016 0451    Additional studies/ records that were reviewed today include:   Myoview 09/03/2016 Study Highlights     The left ventricular ejection fraction is moderately decreased (30-44%).  Nuclear stress EF: 33%.  Findings consistent with prior myocardial  infarction.  This is a high risk study.  There  was no ST segment deviation noted during stress.  Defect 1: There is a large defect of moderate severity present in the mid anterior, mid anteroseptal, apical anterior, apical septal, apical lateral and apex location.   High risk study due to a large fixed perfusion defect in the LAD artery distribution, but with minimal peri-infarct ischemia. Moderate to severe reduction in global left ventricular systolic function.     Echo 11/21/2016 LV EF: 30% -   35%  Study Conclusions  - Left ventricle: The cavity size was moderately dilated. Systolic   function was moderately to severely reduced. The estimated   ejection fraction was in the range of 30% to 35%. There is   akinesis of the anteroseptal myocardium. Features are consistent   with a pseudonormal left ventricular filling pattern, with   concomitant abnormal relaxation and increased filling pressure   (grade 2 diastolic dysfunction). - Aortic valve: Trileaflet; mildly thickened, mildly calcified   leaflets. - Left atrium: The atrium was mildly dilated. Volume/bsa, ES,   (1-plane Simpson&'s, A2C): 32.1 ml/m^2. - Right ventricle: The cavity size was mildly dilated. Wall   thickness was normal.  Impressions:  - Compared to the prior study, there has been no significant   interval change.   ASSESSMENT:    1. Chronic combined systolic and diastolic heart failure ([place])   2. Medication management   3. Ischemic dilated cardiomyopathy ([place])   4. Coronary artery disease involving native coronary artery of native heart without angina pectoris   5. CKD (chronic kidney disease), stage III   6. Essential hypertension   7. Hyperlipidemia, unspecified hyperlipidemia type      PLAN:  In order of problems listed above:  1. Chronic combined systolic and diastolic heart failure: Ejection fraction went down again recently. Given lack of chest discomfort, we'll hold off on ischemic  workup. Plan to up titrate heart failure medication. He was started on losartan lately, he has not had any side effects from this medication. I will switch losartan to Entresto. We have given him coupon card today. Given the initiation of Entresto, I will decrease his Lasix to 20 medical daily with instruction to take an additional 20 mg as needed basis. This is because Secubitril in [place] has weak diuretic effect. His wife has been very good at managing his medication and will let us know if 20 mg Lasix is not controlling his swelling.  2. CAD: Myoview in January 2018 showed large LAD territory infarct with minimal peri-infarct ischemia.  3. Hypertension: Blood pressure borderline low, sometimes he has dizziness, this is likely related to the medication. I have discontinued losartan and started on Entresto 24-26 mg  4. Hyperlipidemia: continue on Crestor   5. Stage III CKD: Obtain basic metabolic panel today and also in one week     Medication Adjustments/Labs and Tests Ordered: Current medicines are reviewed at length with the patient today.  Concerns regarding medicines are outlined above.  Medication changes, Labs and Tests ordered today are listed in the Patient Instructions below. Patient Instructions  Medication Instructions:  STOP LOSARTAN START ENTRESTO 24/26MG  TWICE DAILY FOR ONE MONTH  DECREASE LASIX TO 20MG  DAILY-OK TO TAKE 20MG  AS NEEDED FOR SWELLING  If you need a refill on your cardiac medications before your next appointment, please call your pharmacy.  Labwork: BMP TOMORROW AND IN ONE WEEK HERE IN OUR OFFICE AT [facility]  Follow-Up: Your physician wants you to follow-up in: 1 [place], PA-C    Thank  you for choosing [facility] at [employer], [place]  01/31/2017 12:32 AM    [facility] [address], [place], [place]  [address] Phone: [phone]; Fax: [phone]

## 2017-01-31 ENCOUNTER — Encounter: Payer: Self-pay | Admitting: Physician Assistant

## 2017-01-31 ENCOUNTER — Other Ambulatory Visit: Payer: Self-pay | Admitting: *Deleted

## 2017-01-31 DIAGNOSIS — Z79899 Other long term (current) drug therapy: Secondary | ICD-10-CM | POA: Diagnosis not present

## 2017-02-01 LAB — BASIC METABOLIC PANEL
BUN/Creatinine Ratio: 19 (ref 10–24)
BUN: 32 mg/dL — AB (ref 8–27)
CHLORIDE: 99 mmol/L (ref 96–106)
CO2: 26 mmol/L (ref 20–29)
Calcium: 9.7 mg/dL (ref 8.6–10.2)
Creatinine, Ser: 1.72 mg/dL — ABNORMAL HIGH (ref 0.76–1.27)
GFR calc non Af Amer: 39 mL/min/{1.73_m2} — ABNORMAL LOW (ref >59)
GFR, EST AFRICAN AMERICAN: 45 mL/min/{1.73_m2} — AB (ref >59)
Glucose: 172 mg/dL — ABNORMAL HIGH (ref 65–99)
POTASSIUM: 4.6 mmol/L (ref 3.5–5.2)
Sodium: 142 mmol/L (ref 134–144)

## 2017-02-03 ENCOUNTER — Telehealth: Payer: Self-pay | Admitting: Cardiology

## 2017-02-03 MED ORDER — SACUBITRIL-VALSARTAN 24-26 MG PO TABS: 1.0000 | ORAL_TABLET | Freq: Two times a day (BID) | ORAL | 0 refills | Status: DC

## 2017-02-03 NOTE — Progress Notes (Signed)
Lab work shows kidney function worsened slightly prior to entresto dose, I have decreased his lasix to 20mg  daily daily last week which should help. Continue with plan to obtain BMET in the next 5 days to trend renal function

## 2017-02-03 NOTE — Telephone Encounter (Signed)
Patient's wife walked in w/questions about entresto Rx. She states lasix was sent to pharmacy on 6/28 but not entresto, so patient did not get started on it after 6/28 appt. After the fact, she realized they were given a written Rx for the medication. Also advised patient that the [facility] Rx was sent to the pharmacy earlier today. Advised to start on entresto BID after stopping losartan. Patient should have lab work 1 week after starting entresto (lab slips stated to have done on 7/5 - advised just needs to be after starting new med). No further action necessary

## 2017-02-13 DIAGNOSIS — Z79899 Other long term (current) drug therapy: Secondary | ICD-10-CM | POA: Diagnosis not present

## 2017-02-14 LAB — BASIC METABOLIC PANEL
BUN/Creatinine Ratio: 18 (ref 10–24)
BUN: 32 mg/dL — AB (ref 8–27)
CHLORIDE: 102 mmol/L (ref 96–106)
CO2: 25 mmol/L (ref 20–29)
CREATININE: 1.76 mg/dL — AB (ref 0.76–1.27)
Calcium: 10.1 mg/dL (ref 8.6–10.2)
GFR calc Af Amer: 44 mL/min/{1.73_m2} — ABNORMAL LOW (ref >59)
GFR calc non Af Amer: 38 mL/min/{1.73_m2} — ABNORMAL LOW (ref >59)
GLUCOSE: 96 mg/dL (ref 65–99)
Potassium: 5.9 mmol/L — ABNORMAL HIGH (ref 3.5–5.2)
Sodium: 144 mmol/L (ref 134–144)

## 2017-02-17 ENCOUNTER — Telehealth: Payer: Self-pay | Admitting: Physician Assistant

## 2017-02-17 ENCOUNTER — Telehealth: Payer: Self-pay | Admitting: *Deleted

## 2017-02-17 DIAGNOSIS — Z79899 Other long term (current) drug therapy: Secondary | ICD-10-CM

## 2017-02-17 MED ORDER — FUROSEMIDE 20 MG PO TABS: 20.0000 mg | ORAL_TABLET | Freq: Every day | ORAL | 5 refills | Status: DC | PRN

## 2017-02-17 NOTE — Telephone Encounter (Signed)
-----   Message from [place], [place] sent at 02/14/2017  2:20 PM EDT ----- Cr remain elevated, recommend stop potassium supplement and use 20mg  lasix on an as needed basis. Plan to recheck BMET in 2 weeks on followup.

## 2017-02-17 NOTE — Telephone Encounter (Signed)
Left msg w reiteration of recommendations (OK per DPR) and advice to call if further questions. I did speak to Damon Walker earlier today, but had begun to leave a VM before he'd picked up.

## 2017-02-17 NOTE — Telephone Encounter (Signed)
New Message     Pt is confused about the instruction you left are they to stop the potassium and the lasik , leave message on the voicemail with instruction

## 2017-02-17 NOTE — Telephone Encounter (Signed)
Results and recommendations discussed with patient, who verbalized understanding and thanks. BMET order placed. Med changes made.

## 2017-02-20 ENCOUNTER — Other Ambulatory Visit: Payer: Self-pay | Admitting: *Deleted

## 2017-02-20 ENCOUNTER — Other Ambulatory Visit: Payer: Self-pay | Admitting: Physician Assistant

## 2017-02-20 DIAGNOSIS — I160 Hypertensive urgency: Secondary | ICD-10-CM

## 2017-02-20 MED ORDER — ISOSORBIDE MONONITRATE ER 30 MG PO TB24: 15.0000 mg | ORAL_TABLET | Freq: Every day | ORAL | 3 refills | Status: DC

## 2017-02-20 NOTE — Telephone Encounter (Signed)
Refill Request.  

## 2017-02-25 ENCOUNTER — Encounter: Payer: Self-pay | Admitting: Physician Assistant

## 2017-02-25 ENCOUNTER — Ambulatory Visit (INDEPENDENT_AMBULATORY_CARE_PROVIDER_SITE_OTHER): Payer: Medicare Other | Admitting: Physician Assistant

## 2017-02-25 VITALS — BP 126/62 | HR 56 | Ht 68.0 in | Wt 187.8 lb

## 2017-02-25 DIAGNOSIS — I5042 Chronic combined systolic (congestive) and diastolic (congestive) heart failure: Secondary | ICD-10-CM | POA: Diagnosis not present

## 2017-02-25 DIAGNOSIS — I2109 ST elevation (STEMI) myocardial infarction involving other coronary artery of anterior wall: Secondary | ICD-10-CM | POA: Diagnosis not present

## 2017-02-25 DIAGNOSIS — I10 Essential (primary) hypertension: Secondary | ICD-10-CM | POA: Diagnosis not present

## 2017-02-25 DIAGNOSIS — E785 Hyperlipidemia, unspecified: Secondary | ICD-10-CM

## 2017-02-25 DIAGNOSIS — Z79899 Other long term (current) drug therapy: Secondary | ICD-10-CM

## 2017-02-25 DIAGNOSIS — N183 Chronic kidney disease, stage 3 unspecified: Secondary | ICD-10-CM

## 2017-02-25 DIAGNOSIS — I2510 Atherosclerotic heart disease of native coronary artery without angina pectoris: Secondary | ICD-10-CM

## 2017-02-25 MED ORDER — LOSARTAN POTASSIUM 50 MG PO TABS: 50.0000 mg | ORAL_TABLET | Freq: Every day | ORAL | 11 refills | Status: DC

## 2017-02-25 MED ORDER — LOSARTAN POTASSIUM 25 MG PO TABS: 25.0000 mg | ORAL_TABLET | Freq: Every day | ORAL | 11 refills | Status: DC

## 2017-02-25 NOTE — Patient Instructions (Signed)
Medication Instructions:  STOP-Entresto START-Losartan 25 mg daily  Labwork: BMP  Testing/Procedures: None Ordered  Follow-Up: Your physician recommends that you schedule a follow-up appointment in: 3 Month with Dr Ellyn Hack or Almyra Deforest   Any Other Special Instructions Will Be Listed Below (If Applicable).   If you need a refill on your cardiac medications before your next appointment, please call your pharmacy.

## 2017-02-25 NOTE — Progress Notes (Signed)
Cardiology Office Note    Date:  02/26/2017   ID:  [name], DOB 1946/06/06, MRN [id number]  PCP:  [name], MD  Cardiologist:  Dr. Ellyn Hack   Chief Complaint  Patient presents with  . Follow-up    seen for Dr. Ellyn Hack    History of Present Illness:  Damon Walker is a 71 y.o. male with PMH of carotid artery disease, coronary artery disease s/p stent to LAD in October 2016, CVA, stage III CKD, chronic combined systolic and diastolic heart failure, hypertension, hyperlipidemia andischemic cardiomyopathy. Patient was most recently admitted on the 07/08/2016 for shortness of breath. Repeat echocardiogram obtained on 07/08/2016 showed EF now down to 30-35%, mild LVH, grade 2 diastolic dysfunction. Review of the previous echocardiogram showed his EF reached a nadir of 20-25% in October 2016. However later improved to 40-45% in May 2017 before decreasing down to 30-35% on most recent echocardiogram. I last saw the patient on 07/19/2016, due to his renal function, I did not add ACE inhibitor or ARB, however I did add low-dose Imdur and hydralazine for his LV dysfunction. After discussing the case with Dr. Ellyn Hack, he underwent Myoview on 09/03/2016, this showed EF 33%, high risks study with area consistent with prior MI in the mid anterior, mid anteroseptal, apical anterior, apical septal, apical lateral and apex location, there was only minimal peri-infarct ischemia. A Myoview was reviewed by Dr. Ellyn Hack, given lack of chest discomfort, he recommended medical therapy.  I started him on Entresto during the last office visit. He presents today for Entresto titration. Unfortunately, it does not symptoms he is able to tolerate Entresto, he has numbness in his arms and the legs. He says this only occurred after he started on Entresto, also family is quite worried about the cost. I will switch him back to losartan at this point. He will need a basic metabolic panel   Past Medical History:    Diagnosis Date  . Carotid arterial disease ([place]) 06/2014; 01/09/2015   Followed by Dr. Donnetta Hutching of [facility] -- a) Carotid u/s: 40-59% bilat ICA stenosis; b) Mod 50-69% Bilateral R Int Carotid (heteroenous plaque), Normal L Vert A, unable to find R Vert A.   . 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 ([place]) 05/14/2015   -- Given TPA.  MRI Brain: Acute L Occipital Lobe Infarct. Chronic microvascular ischemic changes in the white matter and left pons.  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 Chronic combined systolic and diastolic congestive heart failure, NYHA class 2 ([place]) 05/11/2015 - 05/15/15   a. EF 20-25% with anterior-anteroseptal akinesis (immediately post anterior STEMI).  ; b. 10/10/'16 Echo: EF 35-40% with moderate mid-apical anteroseptal, anterior and apical hypokinesis.  . Coronary artery disease involving native heart 05/11/2015   Cath: 3V CAD with 90% prox RCA, 80% mid RCA, 80% OM3, (RCA and OM residual treated medically) 100% LAD - PCI to Prox-Mid LAD with Overlapping Promus Premier DES 3.0 x 38 & 3.0 x 16 (post-dilated to 3.5 mm)   . Essential hypertension   . H/O: GI bleed 05/15/2015   s/p Sigmoid Polypectomy 05/04/2015 - prior to STEMI on May 11, 2015; Had GI Bleed following TPA for CVA, while on ASA & Brilinta; Flex Sig - 1. Internal Hemorrhoids, 2. Distal sigmoid polypectomy site with flat Whitebase status post biopsy and tattoo with 31mL spot over 3 injections 3. Proximal sigmoid polypectomy site seen as well, 4) otherwise normal with no  evidence of bleed  . Hyperlipidemia with target LDL less than 70   . Ischemic cardiomyopathy 05/11/2015   EF 40% on cath 05/11/2015 after anterior STEMI, EF 20-25% on echo 05/13/2015; 12/2015 =  EF ~45%.  . Prediabetes Oct 2016   A1C 6.0 in Oct 2016  . ST elevation myocardial infarction (STEMI) involving left anterior descending (LAD) coronary artery with complication ([place]) 54/01/5680    100% Prox LAD - PCI with overlapping DES x 2  . Tobacco abuse    a. 30 yrs - 1.5 ppd. - Quit 05/11/2015  . Vision abnormalities    Right hemianopsia    Past Surgical History:  Procedure Laterality Date  . CARDIAC CATHETERIZATION N/A 05/11/2015   Procedure: Left Heart Cath and Coronary Angiography;  Surgeon: Peter M Martinique, MD;  Location: [facility];  Service: Cardiovascular;  100% pLAD (long lesion). RCA - prox 90%, mid 80%. OM2 & OM3 80% (OM2 & 3 not necessarily PCI targets); EF 35-45% with Anterio HK  . CARDIAC CATHETERIZATION N/A 05/11/2015   Procedure: Coronary Stent Intervention;  Surgeon: Peter M Martinique, MD;  Location: [facility];  Service: Cardiovascular; PCI to Prox-Mid LAD with Overlapping Promus Premier DES 3.0 x 38 & 3.0 x 16 (post-dilated to 3.5 mm)    . FLEXIBLE SIGMOIDOSCOPY N/A 05/18/2015   Procedure: FLEXIBLE SIGMOIDOSCOPY;  Surgeon: Clarene Essex, MD;  Location: [facility];  Service: Endoscopy;  Laterality: N/A;  . NM MYOVIEW LTD  08/2016   EF 30% with large anterior-anteroseptal and apical infarct consistent with LAD infarct. His HIGH RISK because of large infarct. No ischemia.  . S/P Appendectomy     Age 56  . S/P Inguinal Hernia Repair     In his 20's.  . TRANSTHORACIC ECHOCARDIOGRAM  05/15/2015   Mild LVH, EF 35-40% - Mod HK of mid-apical anteroseptal, anterior & apical walls.  Gr 1 DD. Mild bilateral Atrial Enlargement.  . TRANSTHORACIC ECHOCARDIOGRAM  12/2015   EF up to ~40-45% wiht Anterior-Anteroapical HK.  Mod LV dilation with increased LVEDP.  Marland Kitchen TRANSTHORACIC ECHOCARDIOGRAM  12/'17; 4/'18   a) EF ~30-35% (in setting of HTN Urgency). - Gr 2 DD;; b) EF 30-35% (per Dr. Sallyanne Kuster - 35-40%). Anterior-anteroapical Akinesis. Gr2 DD w/ high filling pressures. Mild LA dilation. Mild RV dilation.    Current Medications: Outpatient Medications Prior to Visit  Medication Sig Dispense Refill  . aspirin 81 MG tablet Take 81 mg by mouth daily.    .  carvedilol (COREG) 12.5 MG tablet TAKE ONE TABLET BY MOUTH TWICE DAILY 180 tablet 3  . clopidogrel (PLAVIX) 75 MG tablet Take 1 tablet (75 mg total) by mouth daily. 30 tablet 1  . ferrous sulfate 325 (65 FE) MG tablet Take 325 mg by mouth daily with breakfast.    . furosemide (LASIX) 20 MG tablet Take 1 tablet (20 mg total) by mouth daily as needed for edema. 30 tablet 5  . isosorbide mononitrate (IMDUR) 30 MG 24 hr tablet Take 0.5 tablets (15 mg total) by mouth daily. 30 tablet 3  . mirtazapine (REMERON SOL-TAB) 15 MG disintegrating tablet Take 1 tablet (15 mg total) by mouth at bedtime. 30 tablet 1  . nitroGLYCERIN (NITROSTAT) 0.4 MG SL tablet Place 1 tablet (0.4 mg total) under the tongue every 5 (five) minutes x 3 doses as needed for chest pain. 25 tablet 3  . pantoprazole (PROTONIX) 40 MG tablet Take 40 mg by mouth daily.     . rosuvastatin (CRESTOR)  40 MG tablet Take 1 tablet (40 mg total) by mouth daily at 6 PM. 90 tablet 3  . sacubitril-valsartan (ENTRESTO) 24-26 MG Take 1 tablet by mouth 2 (two) times daily. 60 tablet 0   No facility-administered medications prior to visit.      Allergies:   Latex   Social History   Social History  . Marital status: Married    Spouse name: N/A  . Number of children: 3  . Years of education: N/A   Occupational History  . retired from [employer]  . Smoking status: Former Smoker    Packs/day: 1.00    Years: 30.00    Types: Cigarettes    Quit date: 04/06/2015  . Smokeless tobacco: Never Used     Comment: Quit the day of his STEMI  . Alcohol use No  . Drug use: No  . Sexual activity: Not Asked   Other Topics Concern  . None   Social History Narrative   Lives in [place] with wife. Originally from [place].     Family History:  The patient's family history includes Cancer in his mother; Other in his mother and unknown relative. He was adopted.   ROS:   Please see the history of present illness.    ROS  All other systems reviewed and are negative.   PHYSICAL EXAM:   VS:  BP 126/62 (BP Location: Left Arm, Cuff Size: Normal)   Pulse (!) 56   Ht 5\' 8"  (1.727 m)   Wt 187 lb 12.8 oz (85.2 kg)   BMI 28.55 kg/m    GEN: Well nourished, well developed, in no acute distress  HEENT: normal  Neck: no JVD, carotid bruits, or masses Cardiac: RRR; no murmurs, rubs, or gallops,no edema  Respiratory:  clear to auscultation bilaterally, normal work of breathing GI: soft, nontender, nondistended, + BS MS: no deformity or atrophy  Skin: warm and dry, no rash Neuro:  Alert and Oriented x 3, Strength and sensation are intact Psych: euthymic mood, full affect  Wt Readings from Last 3 Encounters:  02/25/17 187 lb 12.8 oz (85.2 kg)  01/30/17 188 lb (85.3 kg)  11/28/16 191 lb 6.4 oz (86.8 kg)      Studies/Labs Reviewed:   EKG:  EKG is ordered today.  The ekg ordered today demonstrates normal sinus rhythm and T-wave inversion in lateral leads  Recent Labs: 07/08/2016: ALT 24 07/09/2016: Hemoglobin 11.7; Platelets 216 09/24/2016: Brain Natriuretic Peptide 134 02/25/2017: BUN 31; Creatinine, Ser 1.51; Potassium 5.5; Sodium 143   Lipid Panel    Component Value Date/Time   CHOL 109 07/08/2016 0451   TRIG 78 07/08/2016 0451   HDL 39 (L) 07/08/2016 0451   CHOLHDL 2.8 07/08/2016 0451   VLDL 16 07/08/2016 0451   LDLCALC 54 07/08/2016 0451    Additional studies/ records that were reviewed today include:   Myoview 09/03/2016 Study Highlights     The left ventricular ejection fraction is moderately decreased (30-44%).  Nuclear stress EF: 33%.  Findings consistent with prior myocardial infarction.  This is a high risk study.  There was no ST segment deviation noted during stress.  Defect 1: There is a large defect of moderate severity present in the mid anterior, mid anteroseptal, apical anterior, apical septal, apical lateral and apex location.  High risk study due to a large fixed  perfusion defect in the LAD artery distribution, but with minimal peri-infarct ischemia. Moderate to severe reduction in global  left ventricular systolic function.     Echo 11/21/2016 LV EF: 30% - 35%  Study Conclusions  - Left ventricle: The cavity size was moderately dilated. Systolic function was moderately to severely reduced. The estimated ejection fraction was in the range of 30% to 35%. There is akinesis of the anteroseptal myocardium. Features are consistent with a pseudonormal left ventricular filling pattern, with concomitant abnormal relaxation and increased filling pressure (grade 2 diastolic dysfunction). - Aortic valve: Trileaflet; mildly thickened, mildly calcified leaflets. - Left atrium: The atrium was mildly dilated. Volume/bsa, ES, (1-plane Simpson&'s, A2C): 32.1 ml/m^2. - Right ventricle: The cavity size was mildly dilated. Wall thickness was normal.  Impressions:  - Compared to the prior study, there has been no significant interval change.     ASSESSMENT:    1. Chronic combined systolic and diastolic heart failure ([place])   2. Coronary artery disease involving native coronary artery of native heart without angina pectoris   3. Medication management   4. CKD (chronic kidney disease), stage III   5. Essential hypertension   6. Hyperlipidemia, unspecified hyperlipidemia type      PLAN:  In order of problems listed above:  1. Chronic combined systolic and diastolic heart failure: Euvolemic on physical exam. I attempted to place him on Entresto, however he has been having significant amount of cramps in the arms and legs after starting on the medication. His wife is also quite worried about the potential cost. Therefore I have switched him back to losartan.  2. CAD: Myoview in January 2018 showed large LAD territory infarct with minimal peri-infarct ischemia  3. Hypertension: Continue on carvedilol, switching back to  losartan. He is also on Imdur. We may consider adding hydralazine later  4. Hyperlipidemia: Continue on Crestor  5. CKD stage III: BMET today    Medication Adjustments/Labs and Tests Ordered: Current medicines are reviewed at length with the patient today.  Concerns regarding medicines are outlined above.  Medication changes, Labs and Tests ordered today are listed in the Patient Instructions below. Patient Instructions  Medication Instructions:  STOP-Entresto START-Losartan 25 mg daily  Labwork: BMP  Testing/Procedures: None Ordered  Follow-Up: Your physician recommends that you schedule a follow-up appointment in: 3 Month with Dr Ellyn Hack or Almyra Deforest   Any Other Special Instructions Will Be Listed Below (If Applicable).   If you need a refill on your cardiac medications before your next appointment, please call your pharmacy.      Hilbert Corrigan, [place]  02/26/2017 11:57 PM    [facility] [address], [place], [place]  [address] Phone: [phone]; Fax: [phone]

## 2017-02-26 ENCOUNTER — Encounter: Payer: Self-pay | Admitting: Physician Assistant

## 2017-02-26 LAB — BASIC METABOLIC PANEL
BUN/Creatinine Ratio: 21 (ref 10–24)
BUN: 31 mg/dL — AB (ref 8–27)
CALCIUM: 9.5 mg/dL (ref 8.6–10.2)
CO2: 25 mmol/L (ref 20–29)
CREATININE: 1.51 mg/dL — AB (ref 0.76–1.27)
Chloride: 103 mmol/L (ref 96–106)
GFR calc Af Amer: 53 mL/min/{1.73_m2} — ABNORMAL LOW (ref >59)
GFR, EST NON AFRICAN AMERICAN: 45 mL/min/{1.73_m2} — AB (ref >59)
Glucose: 113 mg/dL — ABNORMAL HIGH (ref 65–99)
Potassium: 5.5 mmol/L — ABNORMAL HIGH (ref 3.5–5.2)
Sodium: 143 mmol/L (ref 134–144)

## 2017-05-16 ENCOUNTER — Ambulatory Visit (INDEPENDENT_AMBULATORY_CARE_PROVIDER_SITE_OTHER): Payer: Medicare Other | Admitting: Cardiology

## 2017-05-16 ENCOUNTER — Encounter: Payer: Self-pay | Admitting: Cardiology

## 2017-05-16 VITALS — BP 131/64 | HR 57 | Ht 68.0 in | Wt 184.5 lb

## 2017-05-16 DIAGNOSIS — I255 Ischemic cardiomyopathy: Secondary | ICD-10-CM

## 2017-05-16 DIAGNOSIS — I2510 Atherosclerotic heart disease of native coronary artery without angina pectoris: Secondary | ICD-10-CM

## 2017-05-16 DIAGNOSIS — Z9861 Coronary angioplasty status: Secondary | ICD-10-CM | POA: Diagnosis not present

## 2017-05-16 DIAGNOSIS — I63532 Cerebral infarction due to unspecified occlusion or stenosis of left posterior cerebral artery: Secondary | ICD-10-CM

## 2017-05-16 DIAGNOSIS — Z79899 Other long term (current) drug therapy: Secondary | ICD-10-CM | POA: Diagnosis not present

## 2017-05-16 DIAGNOSIS — I6523 Occlusion and stenosis of bilateral carotid arteries: Secondary | ICD-10-CM | POA: Diagnosis not present

## 2017-05-16 DIAGNOSIS — I10 Essential (primary) hypertension: Secondary | ICD-10-CM | POA: Diagnosis not present

## 2017-05-16 DIAGNOSIS — E785 Hyperlipidemia, unspecified: Secondary | ICD-10-CM

## 2017-05-16 NOTE — Progress Notes (Signed)
PCP: [name], MD  Clinic Note: Chief Complaint  Patient presents with  . Follow-up    no major complaints  . Coronary Artery Disease  . Cardiomyopathy    ischemic; class 1-2 heart failure    HPI: Demichael Traum is a 71 y.o. male with a PMH below who presents today for three-month follow-up for CAD-ischemic cardiomyopathy - complicated by stroke. History of anterior STEMI with PCI to the LAD in October 2016, this was helped by stroke. As result of his anterior MI is ischemic cardiomyopathy with chronic combined systolic and diastolic heart failure, Otherwise he also has hypertension, hyperlipidemia and stage III chronic kidney disease.  He has been started on stress to.  Trey Gulbranson was last seen on 02/25/2017 by Almyra Deforest, PA --> he noted having numbness in his arms and feet that intake and Entresto. He was therefore switched back to losartan.  Recent Hospitalizations: none  Studies Personally Reviewed - (if available, images/films reviewed: From Epic Chart or Care Everywhere)  No new studies  Interval History: [name] returns today overall feeling quite well. He is a little bit tired and sleep well last night, but really has not had any heart symptoms since last visit. He is doing much better on losartan now. No further numbness and weakness in his legs. He has now do working outside for 3-4 hours a time without any significant chest tightness pressure or dyspnea. Clearly he doesn't have the same level of energy that he had before, but seems to be well compensated. He does have some mild end of day swelling, but usually goes down at night. No PND or orthopnea.  He still takes hisstanding dose of Lasix, and then maybe every other day or so he will take an additional dose.  He denies any resting or exertional chest tightness or pressure. He will get short of breath if he tries to exert himself beyond baseline exertion, but no chest pain.  No palpitations, lightheadedness,  dizziness, weakness or syncope/near syncope. No TIA/amaurosis fugax symptoms. No melena, hematochezia, hematuria, or epstaxis. No claudication.  He considered the concept of ICD, but has decided he does not want to go through any more procedures. His wifedoes not necessarily agree with this, but he seems very persistent with this thought process.  ROS: A comprehensive was performed. Review of Systems  Constitutional: Negative for malaise/fatigue (much less fatigue. Overall energy improving.) and weight loss (he basically has lost back the way to the previously gain.).  HENT: Negative for congestion, nosebleeds, sinus pain and sore throat.   Respiratory: Positive for shortness of breath (with overexertion). Negative for cough, sputum production and wheezing.   Cardiovascular:       Per history of present illness  Gastrointestinal: Negative for abdominal pain, blood in stool, heartburn and melena.  Genitourinary: Negative for hematuria.  Musculoskeletal: Negative for back pain, falls, joint pain and myalgias.  Neurological: Negative for dizziness.  Endo/Heme/Allergies: Negative for environmental allergies.  Psychiatric/Behavioral: Negative for depression and memory loss. The patient is not nervous/anxious and does not have insomnia.   All other systems reviewed and are negative.  I have reviewed and (if needed) personally updated the patient's problem list, medications, allergies, past medical and surgical history, social and family history.   Past Medical History:  Diagnosis Date  . Carotid arterial disease ([facility]) 06/2014; 01/09/2015   Followed by Dr. Donnetta Hutching of [facility] -- a) Carotid u/s: 40-59% bilat ICA stenosis; b) Mod 50-69% Bilateral R Int Carotid (heteroenous plaque), Normal  L Vert A, unable to find R Vert A.   . 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  MRI Brain: Acute L Occipital Lobe Infarct. Chronic microvascular ischemic changes in  the white matter and left pons.  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 Chronic combined systolic and diastolic congestive heart failure, NYHA class 2 ([place]) 05/11/2015 - 05/15/15   a. EF 20-25% with anterior-anteroseptal akinesis (immediately post anterior STEMI).  ; b. 10/10/'16 Echo: EF 35-40% with moderate mid-apical anteroseptal, anterior and apical hypokinesis.  . Coronary artery disease involving native heart 05/11/2015   Cath: 3V CAD with 90% prox RCA, 80% mid RCA, 80% OM3, (RCA and OM residual treated medically) 100% LAD - PCI to Prox-Mid LAD with Overlapping Promus Premier DES 3.0 x 38 & 3.0 x 16 (post-dilated to 3.5 mm)   . Essential hypertension   . H/O: GI bleed 05/15/2015   s/p Sigmoid Polypectomy 05/04/2015 - prior to STEMI on May 11, 2015; Had GI Bleed following TPA for CVA, while on ASA & Brilinta; Flex Sig - 1. Internal Hemorrhoids, 2. Distal sigmoid polypectomy site with flat [place] status post biopsy and tattoo with 27mL spot over 3 injections 3. Proximal sigmoid polypectomy site seen as well, 4) otherwise normal with no evidence of bleed  . Hyperlipidemia with target LDL less than 70   . Ischemic cardiomyopathy 05/11/2015   EF 40% on cath 05/11/2015 after anterior STEMI, EF 20-25% on echo 05/13/2015; 12/2015 =  EF ~45%.  . Prediabetes Oct 2016   A1C 6.0 in Oct 2016  . ST elevation myocardial infarction (STEMI) involving left anterior descending (LAD) coronary artery with complication ([place]) 85/11/6268   100% Prox LAD - PCI with overlapping DES x 2  . Tobacco abuse    a. 30 yrs - 1.5 ppd. - Quit 05/11/2015  . Vision abnormalities    Right hemianopsia    Past Surgical History:  Procedure Laterality Date  . CARDIAC CATHETERIZATION N/A 05/11/2015   Procedure: Left Heart Cath and Coronary Angiography;  Surgeon: Peter M Martinique, MD;  Location: [facility];  Service: Cardiovascular;  100% pLAD (long lesion). RCA - prox  90%, mid 80%. OM2 & OM3 80% (OM2 & 3 not necessarily PCI targets); EF 35-45% with Anterio HK  . CARDIAC CATHETERIZATION N/A 05/11/2015   Procedure: Coronary Stent Intervention;  Surgeon: Peter M Martinique, MD;  Location: [facility];  Service: Cardiovascular; PCI to Prox-Mid LAD with Overlapping Promus Premier DES 3.0 x 38 & 3.0 x 16 (post-dilated to 3.5 mm)    . FLEXIBLE SIGMOIDOSCOPY N/A 05/18/2015   Procedure: FLEXIBLE SIGMOIDOSCOPY;  Surgeon: Clarene Essex, MD;  Location: [facility];  Service: Endoscopy;  Laterality: N/A;  . NM MYOVIEW LTD  08/2016   EF 30% with large anterior-anteroseptal and apical infarct consistent with LAD infarct. His HIGH RISK because of large infarct. No ischemia.  . S/P Appendectomy     Age 27  . S/P Inguinal Hernia Repair     In his 20's.  . TRANSTHORACIC ECHOCARDIOGRAM  05/15/2015   Mild LVH, EF 35-40% - Mod HK of mid-apical anteroseptal, anterior & apical walls.  Gr 1 DD. Mild bilateral Atrial Enlargement.  . TRANSTHORACIC ECHOCARDIOGRAM  12/2015   EF up to ~40-45% wiht Anterior-Anteroapical HK.  Mod LV dilation with increased LVEDP.  Marland Kitchen TRANSTHORACIC ECHOCARDIOGRAM  12/'17; 4/'18   a) EF ~30-35% (in  setting of HTN Urgency). - Gr 2 DD;; b) EF 30-35% (per Dr. Sallyanne Kuster - 35-40%). Anterior-anteroapical Akinesis. Gr2 DD w/ high filling pressures. Mild LA dilation. Mild RV dilation.    Current Meds  Medication Sig  . aspirin 81 MG tablet Take 81 mg by mouth daily.  . carvedilol (COREG) 12.5 MG tablet TAKE ONE TABLET BY MOUTH TWICE DAILY  . clopidogrel (PLAVIX) 75 MG tablet Take 1 tablet (75 mg total) by mouth daily.  . ferrous sulfate 325 (65 FE) MG tablet Take 325 mg by mouth daily with breakfast.  . furosemide (LASIX) 20 MG tablet Take 1 tablet (20 mg total) by mouth daily as needed for edema.  . isosorbide mononitrate (IMDUR) 30 MG 24 hr tablet Take 0.5 tablets (15 mg total) by mouth daily.  Marland Kitchen losartan (COZAAR) 25 MG tablet Take 1 tablet (25 mg total) by  mouth daily.  . mirtazapine (REMERON SOL-TAB) 15 MG disintegrating tablet Take 1 tablet (15 mg total) by mouth at bedtime.  . nitroGLYCERIN (NITROSTAT) 0.4 MG SL tablet Place 1 tablet (0.4 mg total) under the tongue every 5 (five) minutes x 3 doses as needed for chest pain.  . pantoprazole (PROTONIX) 40 MG tablet Take 40 mg by mouth daily.   . rosuvastatin (CRESTOR) 40 MG tablet Take 1 tablet (40 mg total) by mouth daily at 6 PM.    Allergies  Allergen Reactions  . Latex Itching and Rash    Social History   Social History  . Marital status: Married    Spouse name: N/A  . Number of children: 3  . Years of education: N/A   Occupational History  . retired from [employer]  . Smoking status: Former Smoker    Packs/day: 1.00    Years: 30.00    Types: Cigarettes    Quit date: 04/06/2015  . Smokeless tobacco: Never Used     Comment: Quit the day of his STEMI  . Alcohol use No  . Drug use: No  . Sexual activity: Not Asked   Other Topics Concern  . None   Social History Narrative   Lives in [place] with wife. Originally from [place].    family history includes Cancer in his mother; Other in his mother and unknown relative. He was adopted.  Wt Readings from Last 3 Encounters:  05/16/17 184 lb 8 oz (83.7 kg)  02/25/17 187 lb 12.8 oz (85.2 kg)  01/30/17 188 lb (85.3 kg)    PHYSICAL EXAM BP 131/64   Pulse (!) 57   Ht 5\' 8"  (1.727 m)   Wt 184 lb 8 oz (83.7 kg)   BMI 28.05 kg/m  Physical Exam  Constitutional: He is oriented to person, place, and time. He appears well-developed and well-nourished. No distress.  Healthy-appearing. Well-groomed  HENT:  Head: Normocephalic and atraumatic.  Mouth/Throat: Oropharynx is clear and moist. No oropharyngeal exudate.  Eyes: No scleral icterus.  Neck: No hepatojugular reflux and no JVD present. Carotid bruit is present (soft right-sided bruit).  Cardiovascular: Normal rate, regular rhythm, normal  heart sounds and intact distal pulses.   No extrasystoles are present. PMI is not displaced.  Exam reveals no gallop and no friction rub.   No murmur heard. Pulmonary/Chest: Effort normal and breath sounds normal. No respiratory distress. He has no wheezes. He has no rales.  Abdominal: Soft. Bowel sounds are normal. He exhibits no distension. There is no tenderness.  Musculoskeletal: Normal range of motion. He  exhibits no edema or deformity.  Neurological: He is alert and oriented to person, place, and time.  Skin: Skin is warm and dry. No rash noted. No erythema. No pallor.  Psychiatric: He has a normal mood and affect. His behavior is normal. Judgment and thought content normal.  Nursing note and vitals reviewed.   Adult ECG Report n/a Other studies Reviewed: Additional studies/ records that were reviewed today include:  Recent Labs:    Lab Results  Component Value Date   CHOL 109 07/08/2016   HDL 39 (L) 07/08/2016   LDLCALC 54 07/08/2016   TRIG 78 07/08/2016   CHOLHDL 2.8 07/08/2016   ASSESSMENT / PLAN: Problem List Items Addressed This Visit    CAD S/P percutaneous coronary angioplasty - Primary (Chronic)    Large anterior MI with LAD stent placed. As RCA and circumflex disease that we are treating medically. As he has had no further angina, and no evidence ischemia on recent Myoview, we will significant management medically with beta blocker, aspirin plus Plavix and statin. He is on Imdur for microvascular disease and has had no further angina.      Relevant Orders   Lipid panel   Hepatic function panel   Cardiomyopathy, ischemic: EF most recently 35-40%) (Chronic)    Borderline EF to consider recommendation for ICD.at this point, he is reluctant to pursue ICD despite answering many questions. He is on low-dose ARB and stable dose of carvedilol with no active heart failure symptoms. He takes a minimal dose of Lasix with occasional when necessary dosing.  Unable to tolerate  Entresto, with therefore we switched him back to ARB and low-dose.      Carotid arterial disease (HCC) (Chronic)    Mild to moderate disease bilaterally. Followed by Dr. Curt Jews - last dopplers Nov 2017.      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 Has some occasional issues with thought processing. Otherwise doing well. He is already on aspirin and Plavix for his CAD. On statin as well with appropriate antihypertensive regimen.      Essential hypertension (Chronic)    No further hypotensive episodes. Now on stable dose of carvedilol and losartan.      Hyperlipidemia with target LDL less than 70 (Chronic)    Last labs were checked in December 2017. Well-controlled. He will be due for follow-up. The next month or  We have ordered labs (fasting lipid panel and CMP)      Relevant Orders   Lipid panel   Hepatic function panel    Other Visit Diagnoses    Medication management       Relevant Orders   Lipid panel   Hepatic function panel      Current medicines are reviewed at length with the patient today. (+/- concerns) n/a The following changes have been made: n/a  Patient Instructions  NO CHANGE WITH CURRENT MEDICATIONS  LABS -- LIPID ,HEPATIC IN [facility] 2018   Your physician wants you to follow-up in [place] DR [name].You will receive a reminder letter in the mail two months in advance. If you don't receive a letter, please call our office to schedule the follow-up appointment.   If you need a refill on your cardiac medications before your next appointment, please call your pharmacy.   Studies Ordered:   Orders Placed This Encounter  Procedures  . Lipid panel  . Hepatic function panel  David [name], M.D., M.S. Interventional Cardiologist   Pager # [phone] Phone # [phone] [address] [place], [place] [address] 

## 2017-05-16 NOTE — Patient Instructions (Addendum)
NO CHANGE WITH CURRENT MEDICATIONS  LABS -- LIPID ,HEPATIC IN [facility] 2018   Your physician wants you to follow-up in 6 MONTHS WITH DR [name].You will receive a reminder letter in the mail two months in advance. If you don't receive a letter, please call our office to schedule the follow-up appointment.   If you need a refill on your cardiac medications before your next appointment, please call your pharmacy.

## 2017-05-18 ENCOUNTER — Encounter: Payer: Self-pay | Admitting: Cardiology

## 2017-05-18 NOTE — Assessment & Plan Note (Signed)
Borderline EF to consider recommendation for ICD.at this point, he is reluctant to pursue ICD despite answering many questions. He is on low-dose ARB and stable dose of carvedilol with no active heart failure symptoms. He takes a minimal dose of Lasix with occasional when necessary dosing.  Unable to tolerate Entresto, with therefore we switched him back to ARB and low-dose.

## 2017-05-18 NOTE — Assessment & Plan Note (Signed)
No further hypotensive episodes. Now on stable dose of carvedilol and losartan.

## 2017-05-18 NOTE — Assessment & Plan Note (Addendum)
No real sustained neurologic issues post stroke. Has some occasional issues with thought processing. Otherwise doing well. He is already on aspirin and Plavix for his CAD. On statin as well with appropriate antihypertensive regimen.

## 2017-05-18 NOTE — Assessment & Plan Note (Signed)
Mild to moderate disease bilaterally. Followed by Dr. Curt Jews - last dopplers Nov 2017.

## 2017-05-18 NOTE — Assessment & Plan Note (Signed)
Last labs were checked in December 2017. Well-controlled. He will be due for follow-up. The next month or  We have ordered labs (fasting lipid panel and CMP)

## 2017-05-18 NOTE — Assessment & Plan Note (Signed)
Large anterior MI with LAD stent placed. As RCA and circumflex disease that we are treating medically. As he has had no further angina, and no evidence ischemia on recent Myoview, we will significant management medically with beta blocker, aspirin plus Plavix and statin. He is on Imdur for microvascular disease and has had no further angina.

## 2017-05-30 DIAGNOSIS — Z79899 Other long term (current) drug therapy: Secondary | ICD-10-CM | POA: Diagnosis not present

## 2017-05-31 LAB — BASIC METABOLIC PANEL
BUN/Creatinine Ratio: 18 (ref 10–24)
BUN: 21 mg/dL (ref 8–27)
CALCIUM: 9.6 mg/dL (ref 8.6–10.2)
CO2: 27 mmol/L (ref 20–29)
CREATININE: 1.15 mg/dL (ref 0.76–1.27)
Chloride: 103 mmol/L (ref 96–106)
GFR calc Af Amer: 73 mL/min/{1.73_m2} (ref >59)
GFR, EST NON AFRICAN AMERICAN: 63 mL/min/{1.73_m2} (ref >59)
GLUCOSE: 85 mg/dL (ref 65–99)
Potassium: 5.2 mmol/L (ref 3.5–5.2)
Sodium: 144 mmol/L (ref 134–144)

## 2017-06-17 ENCOUNTER — Ambulatory Visit: Payer: Medicare Other | Admitting: Vascular Surgery

## 2017-06-17 ENCOUNTER — Encounter (HOSPITAL_COMMUNITY): Payer: Medicare Other

## 2017-06-24 ENCOUNTER — Telehealth: Payer: Self-pay | Admitting: *Deleted

## 2017-06-24 DIAGNOSIS — Z79899 Other long term (current) drug therapy: Secondary | ICD-10-CM

## 2017-06-24 DIAGNOSIS — Z9861 Coronary angioplasty status: Principal | ICD-10-CM

## 2017-06-24 DIAGNOSIS — I2510 Atherosclerotic heart disease of native coronary artery without angina pectoris: Secondary | ICD-10-CM

## 2017-06-24 DIAGNOSIS — E785 Hyperlipidemia, unspecified: Secondary | ICD-10-CM

## 2017-06-24 NOTE — Telephone Encounter (Signed)
-----   Message from Raiford Simmonds, RN sent at 05/16/2017  5:44 PM EDT ----- Labs in dec 12 ,2018 Lipid ,hepatic mail in nov 2018

## 2017-06-24 NOTE — Telephone Encounter (Signed)
Letter and lab slip mailed.

## 2017-07-01 ENCOUNTER — Ambulatory Visit (HOSPITAL_COMMUNITY)
Admission: RE | Admit: 2017-07-01 | Discharge: 2017-07-01 | Disposition: A | Discharge status: Home / Self Care | Payer: Medicare Other | Source: Ambulatory Visit | Attending: Vascular Surgery | Admitting: Vascular Surgery

## 2017-07-01 ENCOUNTER — Ambulatory Visit: Payer: Medicare Other | Admitting: Vascular Surgery

## 2017-07-01 ENCOUNTER — Encounter: Payer: Self-pay | Admitting: Vascular Surgery

## 2017-07-01 VITALS — BP 105/60 | HR 55 | Temp 97.0°F | Resp 16 | Ht 68.0 in | Wt 191.8 lb

## 2017-07-01 DIAGNOSIS — I6523 Occlusion and stenosis of bilateral carotid arteries: Secondary | ICD-10-CM

## 2017-07-01 LAB — VAS US CAROTID
LCCADSYS: -86 cm/s
LCCAPDIAS: 19 cm/s
LCCAPSYS: 83 cm/s
LICAPDIAS: -45 cm/s
LICAPSYS: -163 cm/s
Left CCA dist dias: -22 cm/s
Left ICA dist dias: -26 cm/s
Left ICA dist sys: -81 cm/s
RCCAPDIAS: -14 cm/s
RCCAPSYS: -70 cm/s
RIGHT CCA MID DIAS: 14 cm/s
Right cca dist sys: -87 cm/s

## 2017-07-01 NOTE — Progress Notes (Signed)
HISTORY AND PHYSICAL     CC:  Check up Requesting Provider:  [name], MD  HPI: This is a 71 y.o. male who suffered an MI and underwent PCI and stenting 2. Ace later after being discharged from the hospital, he reported sudden right visual loss. He then returned to the hospital and workup revealed a left occipital lobe infarct. He had no other neurologic logical symptoms at that time.  He had a carotid duplex at that time that revealed 50-69% stenosis bilaterally.  His right vertebral artery was not visualized and the left was patent with antegrade flow.    At his visit last year with Dr. Donnetta Hutching, he had less that 40% right ICA stenosis and 40-59% stenosis of the left ICA.  He states that he has not had any further visual disturbances, speech difficulties or weakness/numbness of unilateral extremities.    He quit smoking after his MI.  He is on Plavix/aspirin daily.  He is on a beta blocker and ARB for blood pressure control.    Past Medical History:  Diagnosis Date  . Carotid arterial disease ([place]) 06/2014; 01/09/2015   Followed by Dr. Donnetta Hutching of [facility] -- a) Carotid u/s: 40-59% bilat ICA stenosis; b) Mod 50-69% Bilateral R Int Carotid (heteroenous plaque), Normal L Vert A, unable to find R Vert A.   . 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  MRI Brain: Acute L Occipital Lobe Infarct. Chronic microvascular ischemic changes in the white matter and left pons.  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 Chronic combined systolic and diastolic congestive heart failure, NYHA class 2 ([place]) 05/11/2015 - 05/15/15   a. EF 20-25% with anterior-anteroseptal akinesis (immediately post anterior STEMI).  ; b. 10/10/'16 Echo: EF 35-40% with moderate mid-apical anteroseptal, anterior and apical hypokinesis.  . Coronary artery disease involving native heart 05/11/2015   Cath: 3V CAD with 90% prox RCA,  80% mid RCA, 80% OM3, (RCA and OM residual treated medically) 100% LAD - PCI to Prox-Mid LAD with Overlapping Promus Premier DES 3.0 x 38 & 3.0 x 16 (post-dilated to 3.5 mm)   . Essential hypertension   . H/O: GI bleed 05/15/2015   s/p Sigmoid Polypectomy 05/04/2015 - prior to STEMI on May 11, 2015; Had GI Bleed following TPA for CVA, while on ASA & Brilinta; Flex Sig - 1. Internal Hemorrhoids, 2. Distal sigmoid polypectomy site with flat Whitebase status post biopsy and tattoo with 49mL spot over 3 injections 3. Proximal sigmoid polypectomy site seen as well, 4) otherwise normal with no evidence of bleed  . Hyperlipidemia with target LDL less than 70   . Ischemic cardiomyopathy 05/11/2015   EF 40% on cath 05/11/2015 after anterior STEMI, EF 20-25% on echo 05/13/2015; 12/2015 =  EF ~45%.  . Prediabetes Oct 2016   A1C 6.0 in Oct 2016  . ST elevation myocardial infarction (STEMI) involving left anterior descending (LAD) coronary artery with complication ([place]) 16/08/958   100% Prox LAD - PCI with overlapping DES x 2  . Tobacco abuse    a. 30 yrs - 1.5 ppd. - Quit 05/11/2015  . Vision abnormalities    Right hemianopsia    Past Surgical History:  Procedure Laterality Date  . CARDIAC CATHETERIZATION N/A 05/11/2015   Procedure: Left Heart Cath and Coronary Angiography;  Surgeon: Peter M Martinique, MD;  Location: [facility];  Service: Cardiovascular;  100%  pLAD (long lesion). RCA - prox 90%, mid 80%. OM2 & OM3 80% (OM2 & 3 not necessarily PCI targets); EF 35-45% with Anterio HK  . CARDIAC CATHETERIZATION N/A 05/11/2015   Procedure: Coronary Stent Intervention;  Surgeon: Peter M Martinique, MD;  Location: [facility];  Service: Cardiovascular; PCI to Prox-Mid LAD with Overlapping Promus Premier DES 3.0 x 38 & 3.0 x 16 (post-dilated to 3.5 mm)    . FLEXIBLE SIGMOIDOSCOPY N/A 05/18/2015   Procedure: FLEXIBLE SIGMOIDOSCOPY;  Surgeon: Clarene Essex, MD;  Location: [facility];  Service: Endoscopy;   Laterality: N/A;  . NM MYOVIEW LTD  08/2016   EF 30% with large anterior-anteroseptal and apical infarct consistent with LAD infarct. His HIGH RISK because of large infarct. No ischemia.  . S/P Appendectomy     Age 36  . S/P Inguinal Hernia Repair     In his 20's.  . TRANSTHORACIC ECHOCARDIOGRAM  05/15/2015   Mild LVH, EF 35-40% - Mod HK of mid-apical anteroseptal, anterior & apical walls.  Gr 1 DD. Mild bilateral Atrial Enlargement.  . TRANSTHORACIC ECHOCARDIOGRAM  12/2015   EF up to ~40-45% wiht Anterior-Anteroapical HK.  Mod LV dilation with increased LVEDP.  Marland Kitchen TRANSTHORACIC ECHOCARDIOGRAM  12/'17; 4/'18   a) EF ~30-35% (in setting of HTN Urgency). - Gr 2 DD;; b) EF 30-35% (per Dr. Sallyanne Kuster - 35-40%). Anterior-anteroapical Akinesis. Gr2 DD w/ high filling pressures. Mild LA dilation. Mild RV dilation.    Allergies  Allergen Reactions  . Latex Itching and Rash    Current Outpatient Medications  Medication Sig Dispense Refill  . aspirin 81 MG tablet Take 81 mg by mouth daily.    . carvedilol (COREG) 12.5 MG tablet TAKE ONE TABLET BY MOUTH TWICE DAILY 180 tablet 3  . clopidogrel (PLAVIX) 75 MG tablet Take 1 tablet (75 mg total) by mouth daily. 30 tablet 1  . ferrous sulfate 325 (65 FE) MG tablet Take 325 mg by mouth daily with breakfast.    . furosemide (LASIX) 20 MG tablet Take 1 tablet (20 mg total) by mouth daily as needed for edema. 30 tablet 5  . isosorbide mononitrate (IMDUR) 30 MG 24 hr tablet Take 0.5 tablets (15 mg total) by mouth daily. 30 tablet 3  . mirtazapine (REMERON SOL-TAB) 15 MG disintegrating tablet Take 1 tablet (15 mg total) by mouth at bedtime. 30 tablet 1  . nitroGLYCERIN (NITROSTAT) 0.4 MG SL tablet Place 1 tablet (0.4 mg total) under the tongue every 5 (five) minutes x 3 doses as needed for chest pain. 25 tablet 3  . pantoprazole (PROTONIX) 40 MG tablet Take 40 mg by mouth daily.     . rosuvastatin (CRESTOR) 40 MG tablet Take 1 tablet (40 mg total) by mouth  daily at 6 PM. 90 tablet 3  . losartan (COZAAR) 25 MG tablet Take 1 tablet (25 mg total) by mouth daily. 30 tablet 11   No current facility-administered medications for this visit.     Family History  Adopted: Yes  Problem Relation Age of Onset  . Other Mother        eczema   . Cancer Mother   . Other Unknown        Adopted - unaware of biological parent's histories.    Social History   Socioeconomic History  . Marital status: Married    Spouse name: Not on file  . Number of children: 3  . Years of education: Not on file  . Highest education level:  Not on file  Social Needs  . Financial resource strain: Not on file  . Food insecurity - worry: Not on file  . Food insecurity - inability: Not on file  . Transportation needs - medical: Not on file  . Transportation needs - non-medical: Not on file  Occupational History  . Occupation: retired from [employer]  . Smoking status: Former Smoker    Packs/day: 1.00    Years: 30.00    Pack years: 30.00    Types: Cigarettes    Last attempt to quit: 04/06/2015    Years since quitting: 2.2  . Smokeless tobacco: Never Used  . Tobacco comment: Quit the day of his STEMI  Substance and Sexual Activity  . Alcohol use: No    Alcohol/week: 0.0 oz  . Drug use: No  . Sexual activity: Not on file  Other Topics Concern  . Not on file  Social History Narrative   Lives in [place] with wife. Originally from [place].     REVIEW OF SYSTEMS:   [X]  denotes positive finding, [ ]  denotes negative finding Cardiac  Comments:  Chest pain or chest pressure:    Shortness of breath upon exertion:    Short of breath when lying flat:    Irregular heart rhythm:        Vascular    Pain in calf, thigh, or hip brought on by ambulation:    Pain in feet at night that wakes you up from your sleep:     Blood clot in your veins:    Leg swelling:         Pulmonary    Oxygen at home:    Productive cough:     Wheezing:           Neurologic    Sudden weakness in arms or legs:     Sudden numbness in arms or legs:     Sudden onset of difficulty speaking or slurred speech:    Temporary loss of vision in one eye:     Problems with dizziness:         Gastrointestinal    Blood in stool:     Vomited blood:         Genitourinary    Burning when urinating:     Blood in urine:        Psychiatric    Major depression:         Hematologic    Bleeding problems:    Problems with blood clotting too easily:        Skin    Rashes or ulcers: x eczema      Constitutional    Fever or chills:      PHYSICAL EXAMINATION:  Vitals:   07/01/17 1408 07/01/17 1412  BP: (!) 113/57 105/60  Pulse: (!) 55   Resp: 16   Temp: (!) 97 F (36.1 C)   SpO2: 98%    Body mass index is 29.16 kg/m.  General:  WDWN in NAD; vital signs documented above Gait: Not observed HENT: WNL, normocephalic Pulmonary: normal non-labored breathing , without Rales, rhonchi,  wheezing Cardiac: regular HR, without  Murmurs, rubs or gallops; without carotid bruits Skin: without rashes Vascular Exam/Pulses:  Right Left  Radial 3+ (hyperdynamic) 3+ (hyperdynamic)  Ulnar Unable to palpate  Unable to palpate   DP Unable to palpate  Unable to palpate   PT Unable to palpate  Unable to palpate    Extremities: without ischemic changes,  without Gangrene , without cellulitis; without open wounds; bilateral feet are warm Musculoskeletal: no muscle wasting or atrophy  Neurologic: A&O X 3;  No focal weakness or paresthesias are detected Psychiatric:  The pt has Normal affect.   Non-Invasive Vascular Imaging:   Carotid Duplex on 07/01/17: Right ICA 1-39% Left ICA 40-59% Left ECA >50% Right vertebral artery not visualized **No change compared to study a year ago.  Pt meds includes: Statin:  Yes.   Beta Blocker:  Yes Aspirin:  Yes.   ACEI:  No. ARB:  Yes.   CCB use:  No Other Antiplatelet/Anticoagulant:  Yes  Plavix   ASSESSMENT/PLAN:: 71 y.o. male with bilateral carotid artery stenosis   -pt continues to be asymptomatic.  His carotid duplex is essentially unchanged from a year ago.  Discussed s/sx of stroke and instructed pt if he has any of these sx, he needs to get to the ER immediately.   -he will return in one year with repeat duplex and f/u with NP   Leontine Locket, PA-C [facility] [phone]  Clinic MD:  Pt seen and examined with Dr. Donnetta Hutching  I have examined the patient, reviewed and agree with above.  Curt Jews, MD 07/01/2017 2:42 PM

## 2017-08-06 DIAGNOSIS — Z86010 Personal history of colonic polyps: Secondary | ICD-10-CM | POA: Diagnosis not present

## 2017-08-06 DIAGNOSIS — D3A8 Other benign neuroendocrine tumors: Secondary | ICD-10-CM | POA: Diagnosis not present

## 2017-08-06 DIAGNOSIS — K219 Gastro-esophageal reflux disease without esophagitis: Secondary | ICD-10-CM | POA: Diagnosis not present

## 2017-08-13 DIAGNOSIS — Z961 Presence of intraocular lens: Secondary | ICD-10-CM | POA: Diagnosis not present

## 2017-08-13 DIAGNOSIS — H2511 Age-related nuclear cataract, right eye: Secondary | ICD-10-CM | POA: Diagnosis not present

## 2017-08-14 ENCOUNTER — Telehealth: Payer: Self-pay

## 2017-08-14 NOTE — Telephone Encounter (Signed)
   [facility] Pre-operative Risk Assessment    Request for surgical clearance:  1. What type of surgery is being performed? Colonoscopy/Endoscopy  2. When is this surgery scheduled? 08/29/17   3. Are there any medications that need to be held prior to surgery and how long?Plavix  4. Practice name and name of physician performing surgery? [facility]   Dr.Magod  5. What is your office phone and fax number? Phone # [phone]  Fax # [phone]  6. Anesthesia type (None, local, MAC, general) ? Karie Soda 08/14/2017, 9:40 AM  _________________________________________________________________   (provider comments below)

## 2017-08-15 NOTE — Telephone Encounter (Signed)
He is far enough out from his PCI that he is okay for him to hold Plavix 5-7 d pre-procedure. Restart 1-2 d post.  He has not had any active angina or heart failure symptoms recently.  As a result, his risk is probably less than 11% documented --especially for colonoscopy.  Glenetta Hew, MD

## 2017-08-15 NOTE — Telephone Encounter (Signed)
  Pre-operative Risk Assessment - Provider Statement    Patient ID:  Damon Walker, DOB: 06-29-1946, MRN: [id number]   [name]'s chart has been reviewed for pre-operative risk assessment.  His case has been reviewed with his primary cardiologist.  The patient was contacted 08/15/2017 in reference to pre-operative risk assessment for pending surgery as outlined.  He was last seen on the 05/16/17 by Dr. Ellyn Hack.    According to ACC/AHA guidelines, he does not require further testing and may proceed with surgery at acceptable risk.   Clearance letter has been faxed to the requesting provider.  Please contact requesting provider to ensure that they have received fax.  Signed,  Richardson Dopp, PA-C  08/15/2017 11:17 AM

## 2017-08-15 NOTE — Telephone Encounter (Signed)
Patient with a history of ischemic heart disease, prior anterior STEMI in October 2016 treated with stenting to the LAD, severe diffuse RCA disease and moderate disease in the OM.  Residual disease has been treated medically.  He also has a history of stroke as well as combined systolic and diastolic heart failure.  Last echo in April 2018 EF 30-35.  He was last seen by Dr. Ellyn Hack May 16, 2017.    He needs a colonoscopy and needs his Plavix held.    His perioperative risk of major cardiac event is high according to RCRI at 11%.    He has been contacted today he needs to be contacted to assess any change in symptoms since last being seen.    I will also forward this note to his primary cardiologist, Dr. Ellyn Hack to ensure he agrees with holding Plavix briefly for his colonoscopy.  Richardson Dopp, PA-C    08/15/2017 10:47 AM

## 2017-08-18 DIAGNOSIS — D3A8 Other benign neuroendocrine tumors: Secondary | ICD-10-CM | POA: Diagnosis not present

## 2017-08-20 NOTE — Telephone Encounter (Signed)
Faxed via EPIC to requesting provider 

## 2017-08-29 DIAGNOSIS — K219 Gastro-esophageal reflux disease without esophagitis: Secondary | ICD-10-CM | POA: Diagnosis not present

## 2017-08-29 DIAGNOSIS — K449 Diaphragmatic hernia without obstruction or gangrene: Secondary | ICD-10-CM | POA: Diagnosis not present

## 2017-08-29 DIAGNOSIS — K635 Polyp of colon: Secondary | ICD-10-CM | POA: Diagnosis not present

## 2017-08-29 DIAGNOSIS — D509 Iron deficiency anemia, unspecified: Secondary | ICD-10-CM | POA: Diagnosis not present

## 2017-08-29 DIAGNOSIS — Z86010 Personal history of colonic polyps: Secondary | ICD-10-CM | POA: Diagnosis not present

## 2017-09-02 DIAGNOSIS — K635 Polyp of colon: Secondary | ICD-10-CM | POA: Diagnosis not present

## 2017-09-06 IMAGING — NM NM MISC PROCEDURE
6 series · 36 of 36 positions shown · non-contrast
Comparison: none

[Series 1: wbr_r-proj_st wbr rest · 6.40mm/px · 6 of 64 frames shown]
[frame 6/64]
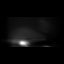
[frame 16/64]
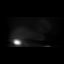
[frame 27/64]
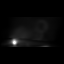
[frame 38/64]
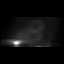
[frame 48/64]
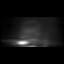
[frame 59/64]
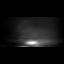

[Series 1: wbr rest · 6.40mm/px · 6 of 64 frames shown]
[frame 6/64]
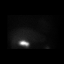
[frame 16/64]
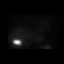
[frame 27/64]
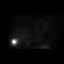
[frame 38/64]
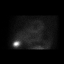
[frame 48/64]
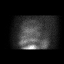
[frame 59/64]
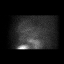

[Series 2: wbr_s-proj_st wbr stress-gsp · 6.40mm/px · 6 of 512 frames shown]
[frame 43/512]
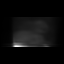
[frame 128/512]
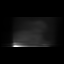
[frame 214/512]
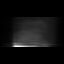
[frame 299/512]
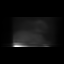
[frame 384/512]
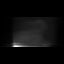
[frame 470/512]
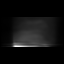

[Series 2: wbr stress-gsp · 6.40mm/px · 6 of 512 frames shown]
[frame 43/512]
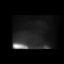
[frame 128/512]
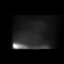
[frame 214/512]
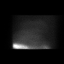
[frame 299/512]
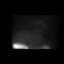
[frame 384/512]
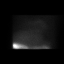
[frame 470/512]
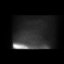

[Series 3: wbr stress-sum-em · 6.40mm/px · 6 of 64 frames shown]
[frame 6/64]
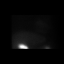
[frame 16/64]
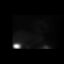
[frame 27/64]
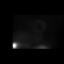
[frame 38/64]
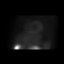
[frame 48/64]
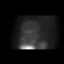
[frame 59/64]
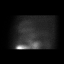

[Series 3: wbr_s-proj_st wbr stress-sum-em · 6.40mm/px · 6 of 64 frames shown]
[frame 6/64]
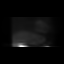
[frame 16/64]
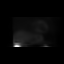
[frame 27/64]
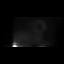
[frame 38/64]
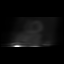
[frame 48/64]
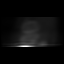
[frame 59/64]
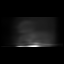

[36 of 36 positions shown; findings below may reference images not displayed]

Canned report from images found in remote index.

Refer to host system for actual result text.

## 2017-09-24 DIAGNOSIS — H2511 Age-related nuclear cataract, right eye: Secondary | ICD-10-CM | POA: Diagnosis not present

## 2017-10-13 ENCOUNTER — Other Ambulatory Visit: Payer: Self-pay | Admitting: Physician Assistant

## 2017-10-13 ENCOUNTER — Other Ambulatory Visit: Payer: Self-pay | Admitting: Cardiology

## 2017-10-13 DIAGNOSIS — I160 Hypertensive urgency: Secondary | ICD-10-CM

## 2017-10-14 ENCOUNTER — Telehealth: Payer: Self-pay | Admitting: Cardiology

## 2017-10-14 NOTE — Telephone Encounter (Signed)
Rx(s) sent to pharmacy electronically.  

## 2017-10-14 NOTE — Telephone Encounter (Signed)
Called patient and LVM to call back to schedule 6 mo ov with Dr. Ellyn Hack.

## 2017-10-14 NOTE — Telephone Encounter (Signed)
Please review for refill, Thanks !  

## 2017-10-22 ENCOUNTER — Telehealth: Payer: Self-pay | Admitting: Cardiology

## 2017-10-22 NOTE — Telephone Encounter (Signed)
Called the patient and LVM for him to call back to schedule overdue followup.

## 2017-10-23 ENCOUNTER — Telehealth: Payer: Self-pay | Admitting: Cardiology

## 2017-10-23 NOTE — Telephone Encounter (Signed)
Called patient and LVM to call back to schedule Dr. Allison Quarry 6 month followup.

## 2017-11-19 ENCOUNTER — Other Ambulatory Visit: Payer: Self-pay | Admitting: Cardiology

## 2017-11-19 DIAGNOSIS — I10 Essential (primary) hypertension: Secondary | ICD-10-CM | POA: Diagnosis not present

## 2017-11-19 DIAGNOSIS — E785 Hyperlipidemia, unspecified: Secondary | ICD-10-CM | POA: Diagnosis not present

## 2017-11-19 DIAGNOSIS — Z0000 Encounter for general adult medical examination without abnormal findings: Secondary | ICD-10-CM | POA: Diagnosis not present

## 2017-11-19 DIAGNOSIS — Z8719 Personal history of other diseases of the digestive system: Secondary | ICD-10-CM | POA: Diagnosis not present

## 2018-03-13 ENCOUNTER — Other Ambulatory Visit: Payer: Self-pay | Admitting: Physician Assistant

## 2018-03-13 NOTE — Telephone Encounter (Signed)
Rx request sent to pharmacy.  

## 2018-04-10 ENCOUNTER — Other Ambulatory Visit: Payer: Self-pay | Admitting: Cardiology

## 2018-05-11 ENCOUNTER — Other Ambulatory Visit: Payer: Self-pay | Admitting: Cardiology

## 2018-05-11 NOTE — Telephone Encounter (Signed)
Rx request sent to pharmacy.  

## 2018-05-21 DIAGNOSIS — Z23 Encounter for immunization: Secondary | ICD-10-CM | POA: Diagnosis not present

## 2018-05-21 DIAGNOSIS — N189 Chronic kidney disease, unspecified: Secondary | ICD-10-CM | POA: Diagnosis not present

## 2018-05-21 DIAGNOSIS — I10 Essential (primary) hypertension: Secondary | ICD-10-CM | POA: Diagnosis not present

## 2018-05-21 DIAGNOSIS — E785 Hyperlipidemia, unspecified: Secondary | ICD-10-CM | POA: Diagnosis not present

## 2018-06-12 ENCOUNTER — Other Ambulatory Visit: Payer: Self-pay | Admitting: Cardiology

## 2018-06-12 DIAGNOSIS — I160 Hypertensive urgency: Secondary | ICD-10-CM

## 2018-06-12 NOTE — Telephone Encounter (Signed)
Rx request sent to pharmacy.  

## 2018-06-13 ENCOUNTER — Other Ambulatory Visit: Payer: Self-pay | Admitting: Cardiology

## 2018-08-06 ENCOUNTER — Other Ambulatory Visit: Payer: Self-pay | Admitting: Physician Assistant

## 2018-08-07 ENCOUNTER — Other Ambulatory Visit: Payer: Self-pay | Admitting: Cardiology

## 2018-08-07 DIAGNOSIS — I160 Hypertensive urgency: Secondary | ICD-10-CM

## 2018-08-27 ENCOUNTER — Other Ambulatory Visit: Payer: Self-pay

## 2018-08-27 DIAGNOSIS — I6523 Occlusion and stenosis of bilateral carotid arteries: Secondary | ICD-10-CM

## 2018-08-28 NOTE — Progress Notes (Signed)
HISTORY AND PHYSICAL     CC:  follow up. Requesting Provider:  [name], MD  HPI: This is a 73 y.o. male here for follow up for carotid artery stenosis.  He was seen in November 2018 and at that time, he had who suffered an MI and underwent PCI and stenting 2.  Later after being discharged from the hospital, he reported sudden right visual loss. He then returned to the hospital and workup revealed a left occipital lobe infarct. He had no other neurologic logical symptoms at that time.  He had a carotid duplex at that time that revealed 50-69% stenosis bilaterally.  His right vertebral artery was not visualized and the left was patent with antegrade flow.    He quit smoking after his MI.  He states he is doing well since his last visit.  He denies amaurosis fugax, speech difficulties, or hemiparesis.  He states he still has visual issues with the left eye since his stroke, but this is unchanged.    He state he has some pain in his hips when he walks and needs hip replacement.  Denies any sores on his feet.   The pt is on a statin for cholesterol management.  The pt is not diabetic.   The pt is on BB and ARB for hypertension.   Tobacco hx:  Remote hx-quit 2016 The pt is on a daily aspirin.  Pt meds includes: Statin:  Yes.   Beta Blocker:  Yes.   Aspirin:  Yes.   ACEI:  No. ARB:  Yes.   CCB use:  No Other Antiplatelet/Anticoagulant:  Yes Plavix   Past Medical History:  Diagnosis Date  . Carotid arterial disease ([place]) 06/2014; 01/09/2015   Followed by Dr. Donnetta Hutching of [facility] -- a) Carotid u/s: 40-59% bilat ICA stenosis; b) Mod 50-69% Bilateral R Int Carotid (heteroenous plaque), Normal L Vert A, unable to find R Vert A.   . 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  MRI Brain: Acute L Occipital Lobe Infarct. Chronic microvascular ischemic changes in the white matter and left pons.  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 Chronic combined systolic and diastolic congestive heart failure, NYHA class 2 ([place]) 05/11/2015 - 05/15/15   a. EF 20-25% with anterior-anteroseptal akinesis (immediately post anterior STEMI).  ; b. 10/10/'16 Echo: EF 35-40% with moderate mid-apical anteroseptal, anterior and apical hypokinesis.  . Coronary artery disease involving native heart 05/11/2015   Cath: 3V CAD with 90% prox RCA, 80% mid RCA, 80% OM3, (RCA and OM residual treated medically) 100% LAD - PCI to Prox-Mid LAD with Overlapping Promus Premier DES 3.0 x 38 & 3.0 x 16 (post-dilated to 3.5 mm)   . Essential hypertension   . H/O: GI bleed 05/15/2015   s/p Sigmoid Polypectomy 05/04/2015 - prior to STEMI on May 11, 2015; Had GI Bleed following TPA for CVA, while on ASA & Brilinta; Flex Sig - 1. Internal Hemorrhoids, 2. Distal sigmoid polypectomy site with flat Whitebase status post biopsy and tattoo with 31mL spot over 3 injections 3. Proximal sigmoid polypectomy site seen as well, 4) otherwise normal with no evidence of bleed  . Hyperlipidemia with target LDL less than 70   . Ischemic cardiomyopathy 05/11/2015   EF 40% on cath 05/11/2015 after anterior STEMI, EF 20-25% on echo 05/13/2015; 12/2015 =  EF ~45%.  . Prediabetes Oct 2016   A1C  6.0 in Oct 2016  . ST elevation myocardial infarction (STEMI) involving left anterior descending (LAD) coronary artery with complication ([place]) 24/12/8097   100% Prox LAD - PCI with overlapping DES x 2  . Tobacco abuse    a. 30 yrs - 1.5 ppd. - Quit 05/11/2015  . Vision abnormalities    Right hemianopsia    Past Surgical History:  Procedure Laterality Date  . CARDIAC CATHETERIZATION N/A 05/11/2015   Procedure: Left Heart Cath and Coronary Angiography;  Surgeon: Peter M Martinique, MD;  Location: [facility];  Service: Cardiovascular;  100% pLAD (long lesion). RCA - prox 90%, mid 80%. OM2 & OM3 80% (OM2 & 3 not necessarily PCI targets); EF 35-45% with Anterio  HK  . CARDIAC CATHETERIZATION N/A 05/11/2015   Procedure: Coronary Stent Intervention;  Surgeon: Peter M Martinique, MD;  Location: [facility];  Service: Cardiovascular; PCI to Prox-Mid LAD with Overlapping Promus Premier DES 3.0 x 38 & 3.0 x 16 (post-dilated to 3.5 mm)    . FLEXIBLE SIGMOIDOSCOPY N/A 05/18/2015   Procedure: FLEXIBLE SIGMOIDOSCOPY;  Surgeon: Clarene Essex, MD;  Location: [facility];  Service: Endoscopy;  Laterality: N/A;  . NM MYOVIEW LTD  08/2016   EF 30% with large anterior-anteroseptal and apical infarct consistent with LAD infarct. His HIGH RISK because of large infarct. No ischemia.  . S/P Appendectomy     Age 32  . S/P Inguinal Hernia Repair     In his 20's.  . TRANSTHORACIC ECHOCARDIOGRAM  05/15/2015   Mild LVH, EF 35-40% - Mod HK of mid-apical anteroseptal, anterior & apical walls.  Gr 1 DD. Mild bilateral Atrial Enlargement.  . TRANSTHORACIC ECHOCARDIOGRAM  12/2015   EF up to ~40-45% wiht Anterior-Anteroapical HK.  Mod LV dilation with increased LVEDP.  Marland Kitchen TRANSTHORACIC ECHOCARDIOGRAM  12/'17; 4/'18   a) EF ~30-35% (in setting of HTN Urgency). - Gr 2 DD;; b) EF 30-35% (per Dr. Sallyanne Kuster - 35-40%). Anterior-anteroapical Akinesis. Gr2 DD w/ high filling pressures. Mild LA dilation. Mild RV dilation.    Allergies  Allergen Reactions  . Latex Itching and Rash    Current Outpatient Medications  Medication Sig Dispense Refill  . aspirin 81 MG tablet Take 81 mg by mouth daily.    . carvedilol (COREG) 12.5 MG tablet TAKE 1 TABLET BY MOUTH TWICE DAILY 180 tablet 3  . clopidogrel (PLAVIX) 75 MG tablet Take 1 tablet (75 mg total) by mouth daily. 30 tablet 1  . ferrous sulfate 325 (65 FE) MG tablet Take 325 mg by mouth daily with breakfast.    . furosemide (LASIX) 20 MG tablet TAKE 1 TABLET BY MOUTH ONCE DAILY 30 tablet 0  . isosorbide mononitrate (IMDUR) 30 MG 24 hr tablet TAKE 1/2 (ONE-HALF) TABLET BY MOUTH ONCE DAILY 15 tablet 0  . losartan (COZAAR) 25 MG tablet Take  1 tablet (25 mg total) by mouth daily. KEEP OV. 90 tablet 0  . mirtazapine (REMERON SOL-TAB) 15 MG disintegrating tablet Take 1 tablet (15 mg total) by mouth at bedtime. 30 tablet 1  . nitroGLYCERIN (NITROSTAT) 0.4 MG SL tablet Place 1 tablet (0.4 mg total) under the tongue every 5 (five) minutes x 3 doses as needed for chest pain. 25 tablet 3  . pantoprazole (PROTONIX) 40 MG tablet Take 40 mg by mouth daily.     . rosuvastatin (CRESTOR) 40 MG tablet Take 1 tablet (40 mg total) by mouth daily at 6 PM. 90 tablet 3   No current facility-administered  medications for this visit.     Family History  Adopted: Yes  Problem Relation Age of Onset  . Other Mother        eczema   . Cancer Mother   . Other Unknown        Adopted - unaware of biological parent's histories.    Social History   Socioeconomic History  . Marital status: Married    Spouse name: Not on file  . Number of children: 3  . Years of education: Not on file  . Highest education level: Not on file  Occupational History  . Occupation: retired from [employer]  . Financial resource strain: Not on file  . Food insecurity:    Worry: Not on file    Inability: Not on file  . Transportation needs:    Medical: Not on file    Non-medical: Not on file  Tobacco Use  . Smoking status: Former Smoker    Packs/day: 1.00    Years: 30.00    Pack years: 30.00    Types: Cigarettes    Last attempt to quit: 04/06/2015    Years since quitting: 3.3  . Smokeless tobacco: Never Used  . Tobacco comment: Quit the day of his STEMI  Substance and Sexual Activity  . Alcohol use: No    Alcohol/week: 0.0 standard drinks  . Drug use: No  . Sexual activity: Not on file  Lifestyle  . Physical activity:    Days per week: Not on file    Minutes per session: Not on file  . Stress: Not on file  Relationships  . Social connections:    Talks on phone: Not on file    Gets together: Not on file    Attends religious service:  Not on file    Active member of club or organization: Not on file    Attends meetings of clubs or organizations: Not on file    Relationship status: Not on file  . Intimate partner violence:    Fear of current or ex partner: Not on file    Emotionally abused: Not on file    Physically abused: Not on file    Forced sexual activity: Not on file  Other Topics Concern  . Not on file  Social History Narrative   Lives in [place] with wife. Originally from [place].     REVIEW OF SYSTEMS:   [X]  denotes positive finding, [ ]  denotes negative finding Cardiac  Comments:  Chest pain or chest pressure:    Shortness of breath upon exertion:    Short of breath when lying flat:    Irregular heart rhythm:        Vascular    Pain in calf, thigh, or hip brought on by ambulation: x   Pain in feet at night that wakes you up from your sleep:     Blood clot in your veins:    Leg swelling:         Pulmonary    Oxygen at home:    Productive cough:     Wheezing:         Neurologic    Sudden weakness in arms or legs:     Sudden numbness in arms or legs:     Sudden onset of difficulty speaking or slurred speech:    Temporary loss of vision in one eye:     Problems with dizziness:         Gastrointestinal  Blood in stool:     Vomited blood:         Genitourinary    Burning when urinating:     Blood in urine:        Psychiatric    Major depression:         Hematologic    Bleeding problems:    Problems with blood clotting too easily:        Skin    Rashes or ulcers: x       Constitutional    Fever or chills:      PHYSICAL EXAMINATION:  Today's Vitals   08/31/18 0935 08/31/18 0939  BP: (!) 107/58 (!) 106/58  Pulse: 66 65  Resp: 18   Temp: (!) 96.9 F (36.1 C)   TempSrc: Oral   SpO2: 97%   Weight: 194 lb (88 kg)   Height: 5\' 8"  (1.727 m)    Body mass index is 29.5 kg/m.   General:  WDWN in NAD; vital signs documented above Gait: Not observed HENT: WNL,  normocephalic Pulmonary: normal non-labored breathing , without Rales, rhonchi,  wheezing Cardiac: regular HR, without  Murmurs, rubs or gallops; without carotid bruits Abdomen: soft, NT, no masses Skin: without rashes Vascular Exam/Pulses:  Right Left  Radial 2+ (normal) 2+ (normal)  Ulnar 1+ (weak) 1+ (weak)  Popliteal Unable to palpate  Unable to palpate   DP 2+ (normal) Unable to palpate   PT 1+ (weak) Unable to palpate    Extremities: without ischemic changes, without Gangrene , without cellulitis; without open wounds;  Musculoskeletal: no muscle wasting or atrophy  Neurologic: A&O X 3 Psychiatric:  The pt has Normal affect.   Non-Invasive Vascular Imaging:   Carotid Duplex on 08/31/2018: Right:  1-39% stenosis Left:  40-59% stenosis Vertebrals:  Left vertebral artery demonstrates antegrade flow. Right vertebral artery demonstrates no discernable flow. Subclavians: Normal flow hemodynamics were seen in bilateral subclavian arteries.  Previous Carotid duplex on 07/01/17: Right: 1-39% stenosis Left:   40-59% stenosis Left ECA >50% stenosis Right vertebral artery not visualized.   ASSESSMENT/PLAN:: 73 y.o. male here for follow up carotid artery stenosis.  -pt doing well and remains asymptomatic.  He does continue to have visual changes in his left eye from the stroke, but this has not changed.  -discussed s/s of stroke and he knows to proceed to the ER should he develop these sx.   -f/u in one year with carotid duplex and see NP/PA  Leontine Locket, PA-C [facility] [phone]  Clinic MD:  Trula Slade

## 2018-08-31 ENCOUNTER — Other Ambulatory Visit: Payer: Self-pay

## 2018-08-31 ENCOUNTER — Ambulatory Visit: Payer: Medicare Other | Admitting: Physician Assistant

## 2018-08-31 ENCOUNTER — Ambulatory Visit (HOSPITAL_COMMUNITY)
Admission: RE | Admit: 2018-08-31 | Discharge: 2018-08-31 | Disposition: A | Discharge status: Home / Self Care | Payer: Medicare Other | Source: Ambulatory Visit | Attending: Family | Admitting: Family

## 2018-08-31 VITALS — BP 106/58 | HR 65 | Temp 96.9°F | Resp 18 | Ht 68.0 in | Wt 194.0 lb

## 2018-08-31 DIAGNOSIS — I6523 Occlusion and stenosis of bilateral carotid arteries: Secondary | ICD-10-CM

## 2018-09-09 ENCOUNTER — Encounter: Payer: Self-pay | Admitting: Cardiology

## 2018-09-09 ENCOUNTER — Ambulatory Visit: Payer: Medicare Other | Admitting: Cardiology

## 2018-09-09 VITALS — BP 122/60 | HR 61 | Ht 68.0 in | Wt 196.6 lb

## 2018-09-09 DIAGNOSIS — I2109 ST elevation (STEMI) myocardial infarction involving other coronary artery of anterior wall: Secondary | ICD-10-CM

## 2018-09-09 DIAGNOSIS — I10 Essential (primary) hypertension: Secondary | ICD-10-CM | POA: Diagnosis not present

## 2018-09-09 DIAGNOSIS — I2510 Atherosclerotic heart disease of native coronary artery without angina pectoris: Secondary | ICD-10-CM | POA: Diagnosis not present

## 2018-09-09 DIAGNOSIS — Z955 Presence of coronary angioplasty implant and graft: Secondary | ICD-10-CM

## 2018-09-09 DIAGNOSIS — I255 Ischemic cardiomyopathy: Secondary | ICD-10-CM | POA: Diagnosis not present

## 2018-09-09 DIAGNOSIS — E785 Hyperlipidemia, unspecified: Secondary | ICD-10-CM

## 2018-09-09 DIAGNOSIS — I160 Hypertensive urgency: Secondary | ICD-10-CM | POA: Diagnosis not present

## 2018-09-09 DIAGNOSIS — Z9861 Coronary angioplasty status: Secondary | ICD-10-CM

## 2018-09-09 MED ORDER — LOSARTAN POTASSIUM 25 MG PO TABS: 25.0000 mg | ORAL_TABLET | Freq: Every day | ORAL | 3 refills | Status: DC

## 2018-09-09 MED ORDER — ISOSORBIDE MONONITRATE ER 30 MG PO TB24: 15.0000 mg | ORAL_TABLET | Freq: Every day | ORAL | 3 refills | Status: DC

## 2018-09-09 NOTE — Progress Notes (Signed)
PCP: Damon Pepper, MD  Clinic Note: Chief Complaint  Patient presents with  . Follow-up    Doing well.  . Coronary Artery Disease  . Cardiomyopathy    Ischemic    HPI: Damon Walker is a 73 y.o. male with a PMH below who presents today for three-month follow-up for CAD-ischemic cardiomyopathy - complicated by stroke.  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  Damon Walker was last seen on in Oct 2018 --he was doing pretty well.  A little bit tired, but no heart failure or angina symptoms.  Was doing lots of his outside work for least 3 to 4 hours at a time without having any symptoms.  Doing well with standing dose of Lasix and not taking additional doses very often.  Recent Hospitalizations: none  Studies Personally Reviewed - (if available, images/films reviewed: From Epic Chart or Care Everywhere)  Carotid Dopplers January 2020: R ICA <39%.  LICA 40 to 25%.  No right vertebral flow noted with antegrade left vertebral flow.  Normal flow dynamics and bilateral subclavian's.  No subclavian steal  Interval History: Damon Walker returns today for delayed annual follow-up doing quite well.  He says that he they have recently moved to an apartment from their house, and as such, he has not been getting as much exercise going up and down stairs.  He is also not doing the outside work that he used to do.  Really he is limited by right hip pain which is kept him from being able do a lot of activity --> he has been contemplating what it would mean for him to have surgery.  He is not noticing any resting or exertional chest tightness or pressure.  No resting or exertional dyspnea beyond what he is always had.  More of his huffing and puffing breathing is because of his  hip pain and not from true dyspnea.  His edema has been well controlled only taking extra Lasix may be once or twice a month.  Remainder of cardiac review of symptoms: No PND, orthopnea with well controlled edema.  No palpitations, lightheadedness, dizziness, weakness or syncope/near syncope. No TIA/amaurosis fugax symptoms. No claudication.  Remains certain about his decision re: ICD.   ROS: A comprehensive was performed. Review of Systems  Constitutional: Negative for malaise/fatigue ([employer] - just not as acitve B/c hip pain.Marland Kitchen) and weight loss.  HENT: Negative for congestion, nosebleeds, sinus pain and sore throat.   Respiratory: Positive for shortness of breath (with overexertion). Negative for cough, sputum production and wheezing.   Cardiovascular:       Per history of present illness  Gastrointestinal: Negative for abdominal pain, blood in stool, heartburn and melena.  Genitourinary: Negative for hematuria.  Musculoskeletal: Positive for joint pain (Lots of right hip pain). Negative for back pain, falls and myalgias.  Neurological: Negative for dizziness (Occasionally orthostatic).  Endo/Heme/Allergies: Negative for environmental allergies.  Psychiatric/Behavioral: Negative for depression and memory loss. The patient is not nervous/anxious and does not have insomnia.   All other systems reviewed and are negative.  I have reviewed and (if needed) personally updated the patient's problem list, medications, allergies, past medical and surgical history, social and family history.   Past Medical History:  Diagnosis Date  . Carotid arterial disease ([place]) 06/2014; 01/09/2015  Followed by Dr. Donnetta Hutching of [facility] -- a) Carotid u/s: 40-59% bilat ICA stenosis; b) Mod 50-69% Bilateral R Int Carotid (heteroenous plaque), Normal L Vert A, unable to find R Vert A.   . 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 ([place]) 05/14/2015   -- Given TPA.  MRI Brain: Acute L Occipital Lobe  Infarct. Chronic microvascular ischemic changes in the white matter and left pons.  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 Chronic combined systolic and diastolic congestive heart failure, NYHA class 2 ([place]) 05/11/2015 - 05/15/15   a. EF 20-25% with anterior-anteroseptal akinesis (immediately post anterior STEMI).  ; b. 10/10/'16 Echo: EF 35-40% with moderate mid-apical anteroseptal, anterior and apical hypokinesis.  . Coronary artery disease involving native heart 05/11/2015   Cath: 3V CAD with 90% prox RCA, 80% mid RCA, 80% OM3, (RCA and OM residual treated medically) 100% LAD - PCI to Prox-Mid LAD with Overlapping Promus Premier DES 3.0 x 38 & 3.0 x 16 (post-dilated to 3.5 mm)   . Essential hypertension   . H/O: GI bleed 05/15/2015   s/p Sigmoid Polypectomy 05/04/2015 - prior to STEMI on May 11, 2015; Had GI Bleed following TPA for CVA, while on ASA & Brilinta; Flex Sig - 1. Internal Hemorrhoids, 2. Distal sigmoid polypectomy site with flat White[place] status post biopsy and tattoo with 78mL spot over 3 injections 3. Proximal sigmoid polypectomy site seen as well, 4) otherwise normal with no evidence of bleed  . Hyperlipidemia with target LDL less than 70   . Ischemic cardiomyopathy 05/11/2015   EF 40% on cath 05/11/2015 after anterior STEMI, EF 20-25% on echo 05/13/2015; 12/2015 =  EF ~45%.  . Prediabetes Oct 2016   A1C 6.0 in Oct 2016  . ST elevation myocardial infarction (STEMI) involving left anterior descending (LAD) coronary artery with complication ([place]) 95/08/8839   100% Prox LAD - PCI with overlapping DES x 2  . Tobacco abuse    a. 30 yrs - 1.5 ppd. - Quit 05/11/2015  . Vision abnormalities    Right hemianopsia    Past Surgical History:  Procedure Laterality Date  . CARDIAC CATHETERIZATION N/A 05/11/2015   Procedure: Left Heart Cath and Coronary Angiography;  Surgeon: Damon M Martinique, MD;  Location: [facility];  Service:  Cardiovascular;  100% pLAD (long lesion). RCA - prox 90%, mid 80%. OM2 & OM3 80% (OM2 & 3 not necessarily PCI targets); EF 35-45% with Anterio HK  . CARDIAC CATHETERIZATION N/A 05/11/2015   Procedure: Coronary Stent Intervention;  Surgeon: Damon M Martinique, MD;  Location: [facility];  Service: Cardiovascular; PCI to Prox-Mid LAD with Overlapping Promus Premier DES 3.0 x 38 & 3.0 x 16 (post-dilated to 3.5 mm)    . FLEXIBLE SIGMOIDOSCOPY N/A 05/18/2015   Procedure: FLEXIBLE SIGMOIDOSCOPY;  Surgeon: Clarene Essex, MD;  Location: [facility];  Service: Endoscopy;  Laterality: N/A;  . NM MYOVIEW LTD  08/2016   EF 30% with large anterior-anteroseptal and apical infarct consistent with LAD infarct. His HIGH RISK because of large infarct. No ischemia.  . S/P Appendectomy     Age 32  . S/P Inguinal Hernia Repair     In his 20's.  . TRANSTHORACIC ECHOCARDIOGRAM  05/15/2015   Mild LVH, EF 35-40% - Mod HK of mid-apical anteroseptal, anterior & apical walls.  Gr 1 DD. Mild bilateral Atrial Enlargement.  . TRANSTHORACIC ECHOCARDIOGRAM  12/2015   EF up to ~  40-45% wiht Anterior-Anteroapical HK.  Mod LV dilation with increased LVEDP.  Marland Kitchen TRANSTHORACIC ECHOCARDIOGRAM  12/'17; 4/'18   a) EF ~30-35% (in setting of HTN Urgency). - Gr 2 DD;; b) EF 30-35% (per Dr. Sallyanne Kuster - 35-40%). Anterior-anteroapical Akinesis. Gr2 DD w/ high filling pressures. Mild LA dilation. Mild RV dilation.    Current Meds  Medication Sig  . carvedilol (COREG) 12.5 MG tablet TAKE 1 TABLET BY MOUTH TWICE DAILY  . clopidogrel (PLAVIX) 75 MG tablet Take 1 tablet (75 mg total) by mouth daily.  . ferrous sulfate 325 (65 FE) MG tablet Take 325 mg by mouth daily with breakfast.  . furosemide (LASIX) 20 MG tablet TAKE 1 TABLET BY MOUTH ONCE DAILY  . isosorbide mononitrate (IMDUR) 30 MG 24 hr tablet Take 0.5 tablets (15 mg total) by mouth daily.  Marland Kitchen losartan (COZAAR) 25 MG tablet Take 1 tablet (25 mg total) by mouth daily.  . nitroGLYCERIN  (NITROSTAT) 0.4 MG SL tablet Place 1 tablet (0.4 mg total) under the tongue every 5 (five) minutes x 3 doses as needed for chest pain.  . pantoprazole (PROTONIX) 40 MG tablet Take 40 mg by mouth daily.   . rosuvastatin (CRESTOR) 40 MG tablet Take 1 tablet (40 mg total) by mouth daily at 6 PM.  . [DISCONTINUED] aspirin 81 MG tablet Take 81 mg by mouth daily.  . [DISCONTINUED] isosorbide mononitrate (IMDUR) 30 MG 24 hr tablet TAKE 1/2 (ONE-HALF) TABLET BY MOUTH ONCE DAILY  . [DISCONTINUED] losartan (COZAAR) 25 MG tablet Take 1 tablet (25 mg total) by mouth daily. KEEP OV.    Allergies  Allergen Reactions  . Latex Itching, Rash and Other (See Comments)    Blisters    Social History   Tobacco Use  . Smoking status: Former Smoker    Packs/day: 1.00    Years: 30.00    Pack years: 30.00    Types: Cigarettes    Last attempt to quit: 04/06/2015    Years since quitting: 3.4  . Smokeless tobacco: Never Used  . Tobacco comment: Quit the day of his STEMI  Substance Use Topics  . Alcohol use: No    Alcohol/week: 0.0 standard drinks  . Drug use: No   Social History   Social History Narrative   Lives in [place] with wife. Originally from [place].     family history includes Cancer in his mother; Other in his mother and another family member. He was adopted.  Wt Readings from Last 3 Encounters:  09/09/18 196 lb 9.6 oz (89.2 kg)  08/31/18 194 lb (88 kg)  07/01/17 191 lb 12.8 oz (87 kg)    PHYSICAL EXAM BP 122/60   Pulse 61   Ht 5\' 8"  (1.727 m)   Wt 196 lb 9.6 oz (89.2 kg)   BMI 29.89 kg/m  Physical Exam  Constitutional: He is oriented to person, place, and time. He appears well-developed and well-nourished. No distress.  Healthy-appearing. Well-groomed  HENT:  Head: Normocephalic and atraumatic.  Mouth/Throat: Oropharynx is clear and moist. No oropharyngeal exudate.  Eyes: No scleral icterus.  Neck: Normal range of motion. Neck supple. No hepatojugular reflux and no JVD present.  Carotid bruit is present (soft right-sided bruit).  Cardiovascular: Normal rate, regular rhythm, normal heart sounds and intact distal pulses.  No extrasystoles are present. PMI is not displaced. Exam reveals no gallop and no friction rub.  No murmur heard. Pulmonary/Chest: Effort normal and breath sounds normal. No respiratory distress. He has no wheezes. He has  no rales.  Abdominal: Soft. Bowel sounds are normal. He exhibits no distension. There is no abdominal tenderness.  Musculoskeletal: Normal range of motion.        General: No deformity or edema.  Neurological: He is alert and oriented to person, place, and time.  Psychiatric: He has a normal mood and affect. His behavior is normal. Judgment and thought content normal.  Nursing note and vitals reviewed.   Adult ECG Report Normal sinus rhythm, rate 61 bpm.  Anteroseptal MI, age undetermined.  Lateral T wave inversions. Stable EKG.  Other studies Reviewed: Additional studies/ records that were reviewed today include:  Recent Labs:   April 2019-TC 134, TG 125, HDL 40, LDL 70.   ASSESSMENT / PLAN: Problem List Items Addressed This Visit    CAD S/P percutaneous coronary angioplasty - Primary (Chronic)    Large anterior MI with LAD stent.  There is also significant RCA and circumflex disease being treated medically.  No recurrent angina. Interestingly, no ischemia noted on recent Myoview. Continue medical management:  Continue Plavix, but okay to stop aspirin.  Continue carvedilol and losartan.  Continue low-dose Imdur for now, but can probably discontinue if stable next visit.  Continue statin      Relevant Medications   isosorbide mononitrate (IMDUR) 30 MG 24 hr tablet   losartan (COZAAR) 25 MG tablet   Cardiomyopathy, ischemic: EF most recently 35-40%) (Chronic)    Borderline EF for ICD evaluation.  However he declines ICD.  We tried to start [facility], however financially this was not feasible.  Is therefore now back on  ARB plus stable dose of Crestor.  On standing dose of furosemide 20 mg and only taking rare extra doses.  Remains euvolemic on exam.  Class I and may be up to 2 CHF symptoms.      Relevant Medications   isosorbide mononitrate (IMDUR) 30 MG 24 hr tablet   losartan (COZAAR) 25 MG tablet   Other Relevant Orders   EKG 12-Lead (Completed)   Essential hypertension (Chronic)    Blood pressure now stable.  There were issues with hypotension in the past, now doing well on carvedilol and low-dose losartan.  Was not able to afford Entresto.      Relevant Medications   isosorbide mononitrate (IMDUR) 30 MG 24 hr tablet   losartan (COZAAR) 25 MG tablet   Other Relevant Orders   EKG 12-Lead (Completed)   Hyperlipidemia with target LDL less than 70 (Chronic)    Labs from April of last year showed LDL of 70.  This is on 40 of Crestor.  Would like to see his labs being a little better than that, may want to consider Zetia.  For now we will recheck labs in April to reassess.      Relevant Medications   isosorbide mononitrate (IMDUR) 30 MG 24 hr tablet   losartan (COZAAR) 25 MG tablet   Presence of drug coated stent in LAD coronary artery (Chronic)    Now on maintenance Plavix.  With no evidence of recurrent GI bleed, would just can to continue Plavix alone and stop aspirin. Okay to stop Plavix for procedures 5-7 days preop).  With combination of stroke and CAD would otherwise stay on Plavix lifelong.      ST elevation myocardial infarction (STEMI) of anterior wall, subsequent episode of care [facility]) (Chronic)    Delayed presentation of anterior STEMI lateral large anterior infarct as indicated by both echo and Myoview.  This resulted in a significant drop  in his EF with anterior wall motion normality. However despite this, he seems to be doing relatively well with no recurrent angina or any significant heart failure symptoms.      Relevant Medications   isosorbide mononitrate (IMDUR) 30 MG 24 hr  tablet   losartan (COZAAR) 25 MG tablet    Other Visit Diagnoses    Hypertensive urgency       Relevant Medications   isosorbide mononitrate (IMDUR) 30 MG 24 hr tablet   losartan (COZAAR) 25 MG tablet      Current medicines are reviewed at length with the patient today. (+/- concerns) n/a The following changes have been made:Okay to stop aspirin  Patient Instructions  Medication Instructions:  Dr Ellyn Hack has recommended making the following medication changes: 1. STOP Aspirin  If you need a refill on your cardiac medications before your next appointment, please call your pharmacy.   Follow-Up: At [facility], you and your health needs are our priority.  As part of our continuing mission to provide you with exceptional heart care, we have created designated Provider Care Teams.  These Care Teams include your primary Cardiologist (physician) and Advanced Practice Providers (APPs -  Physician Assistants and Nurse Practitioners) who all work together to provide you with the care you need, when you need it. You will need a follow up appointment in 12 months. You may see Glenetta Hew, MD or one of the following Advanced Practice Providers on your designated Care Team:   Rosaria Ferries, PA-C . Jory Sims, DNP, ANP . You will receive a reminder letter in the mail two months in advance. If you don't receive a letter, please call our office to schedule the follow-up appointment.   Studies Ordered:   Orders Placed This Encounter  Procedures  . EKG 12-Lead      Glenetta Hew, M.D., M.S. Interventional Cardiologist   Pager # [phone] Phone # [phone] [address] [place], [place] [address]

## 2018-09-09 NOTE — Patient Instructions (Signed)
Medication Instructions:  Dr Ellyn Hack has recommended making the following medication changes: 1. STOP Aspirin  If you need a refill on your cardiac medications before your next appointment, please call your pharmacy.   Follow-Up: At [facility], you and your health needs are our priority.  As part of our continuing mission to provide you with exceptional heart care, we have created designated Provider Care Teams.  These Care Teams include your primary Cardiologist (physician) and Advanced Practice Providers (APPs -  Physician Assistants and Nurse Practitioners) who all work together to provide you with the care you need, when you need it. You will need a follow up appointment in 12 months. You may see Glenetta Hew, MD or one of the following Advanced Practice Providers on your designated Care Team:   Rosaria Ferries, PA-C . Jory Sims, DNP, ANP . You will receive a reminder letter in the mail two months in advance. If you don't receive a letter, please call our office to schedule the follow-up appointment.

## 2018-09-12 ENCOUNTER — Encounter: Payer: Self-pay | Admitting: Cardiology

## 2018-09-12 NOTE — Assessment & Plan Note (Signed)
Labs from April of last year showed LDL of 70.  This is on 40 of Crestor.  Would like to see his labs being a little better than that, may want to consider Zetia.  For now we will recheck labs in April to reassess.

## 2018-09-12 NOTE — Assessment & Plan Note (Signed)
Delayed presentation of anterior STEMI lateral large anterior infarct as indicated by both echo and Myoview.  This resulted in a significant drop in his EF with anterior wall motion normality. However despite this, he seems to be doing relatively well with no recurrent angina or any significant heart failure symptoms.

## 2018-09-12 NOTE — Assessment & Plan Note (Signed)
Blood pressure now stable.  There were issues with hypotension in the past, now doing well on carvedilol and low-dose losartan.  Was not able to afford Entresto.

## 2018-09-12 NOTE — Assessment & Plan Note (Signed)
Large anterior MI with LAD stent.  There is also significant RCA and circumflex disease being treated medically.  No recurrent angina. Interestingly, no ischemia noted on recent Myoview. Continue medical management:  Continue Plavix, but okay to stop aspirin.  Continue carvedilol and losartan.  Continue low-dose Imdur for now, but can probably discontinue if stable next visit.  Continue statin

## 2018-09-12 NOTE — Assessment & Plan Note (Addendum)
Borderline EF for ICD evaluation.  However he declines ICD.  We tried to start [facility], however financially this was not feasible.  Is therefore now back on ARB plus stable dose of Crestor.  On standing dose of furosemide 20 mg and only taking rare extra doses.  Remains euvolemic on exam.  Class I and may be up to 2 CHF symptoms.

## 2018-09-12 NOTE — Assessment & Plan Note (Signed)
Now on maintenance Plavix.  With no evidence of recurrent GI bleed, would just can to continue Plavix alone and stop aspirin. Okay to stop Plavix for procedures 5-7 days preop).  With combination of stroke and CAD would otherwise stay on Plavix lifelong.

## 2018-09-21 ENCOUNTER — Other Ambulatory Visit: Payer: Self-pay | Admitting: Physician Assistant

## 2018-11-06 ENCOUNTER — Other Ambulatory Visit: Payer: Self-pay | Admitting: Cardiology

## 2018-11-06 NOTE — Telephone Encounter (Signed)
Carvedilol refilled.

## 2018-11-25 DIAGNOSIS — E785 Hyperlipidemia, unspecified: Secondary | ICD-10-CM | POA: Diagnosis not present

## 2018-11-25 DIAGNOSIS — Z0000 Encounter for general adult medical examination without abnormal findings: Secondary | ICD-10-CM | POA: Diagnosis not present

## 2018-11-25 DIAGNOSIS — Z8719 Personal history of other diseases of the digestive system: Secondary | ICD-10-CM | POA: Diagnosis not present

## 2018-11-25 DIAGNOSIS — I10 Essential (primary) hypertension: Secondary | ICD-10-CM | POA: Diagnosis not present

## 2018-11-27 DIAGNOSIS — M7021 Olecranon bursitis, right elbow: Secondary | ICD-10-CM | POA: Diagnosis not present

## 2018-12-16 DIAGNOSIS — H906 Mixed conductive and sensorineural hearing loss, bilateral: Secondary | ICD-10-CM | POA: Diagnosis not present

## 2018-12-16 DIAGNOSIS — H73893 Other specified disorders of tympanic membrane, bilateral: Secondary | ICD-10-CM | POA: Diagnosis not present

## 2018-12-22 DIAGNOSIS — M7061 Trochanteric bursitis, right hip: Secondary | ICD-10-CM | POA: Insufficient documentation

## 2018-12-22 DIAGNOSIS — M25551 Pain in right hip: Secondary | ICD-10-CM | POA: Diagnosis not present

## 2018-12-30 DIAGNOSIS — K219 Gastro-esophageal reflux disease without esophagitis: Secondary | ICD-10-CM | POA: Diagnosis not present

## 2018-12-30 DIAGNOSIS — R198 Other specified symptoms and signs involving the digestive system and abdomen: Secondary | ICD-10-CM | POA: Diagnosis not present

## 2018-12-30 DIAGNOSIS — Z86010 Personal history of colonic polyps: Secondary | ICD-10-CM | POA: Diagnosis not present

## 2019-01-04 DIAGNOSIS — M7061 Trochanteric bursitis, right hip: Secondary | ICD-10-CM | POA: Diagnosis not present

## 2019-01-13 DIAGNOSIS — M7061 Trochanteric bursitis, right hip: Secondary | ICD-10-CM | POA: Diagnosis not present

## 2019-01-19 DIAGNOSIS — M7022 Olecranon bursitis, left elbow: Secondary | ICD-10-CM | POA: Diagnosis not present

## 2019-01-20 DIAGNOSIS — M7061 Trochanteric bursitis, right hip: Secondary | ICD-10-CM | POA: Diagnosis not present

## 2019-02-20 ENCOUNTER — Other Ambulatory Visit: Payer: Self-pay | Admitting: Otolaryngology

## 2019-02-20 DIAGNOSIS — H73893 Other specified disorders of tympanic membrane, bilateral: Secondary | ICD-10-CM

## 2019-02-20 DIAGNOSIS — H906 Mixed conductive and sensorineural hearing loss, bilateral: Secondary | ICD-10-CM

## 2019-02-26 ENCOUNTER — Ambulatory Visit
Admission: RE | Admit: 2019-02-26 | Discharge: 2019-02-26 | Disposition: A | Discharge status: Home / Self Care | Payer: Medicare Other | Source: Ambulatory Visit | Attending: Otolaryngology | Admitting: Otolaryngology

## 2019-02-26 ENCOUNTER — Other Ambulatory Visit: Payer: Self-pay

## 2019-02-26 DIAGNOSIS — H73893 Other specified disorders of tympanic membrane, bilateral: Secondary | ICD-10-CM

## 2019-02-26 DIAGNOSIS — H903 Sensorineural hearing loss, bilateral: Secondary | ICD-10-CM | POA: Diagnosis not present

## 2019-02-26 DIAGNOSIS — H906 Mixed conductive and sensorineural hearing loss, bilateral: Secondary | ICD-10-CM

## 2019-03-02 ENCOUNTER — Other Ambulatory Visit: Payer: Medicare Other

## 2019-03-03 ENCOUNTER — Other Ambulatory Visit: Payer: Medicare Other

## 2019-04-08 DIAGNOSIS — Z91040 Latex allergy status: Secondary | ICD-10-CM | POA: Diagnosis not present

## 2019-04-08 DIAGNOSIS — H7013 Chronic mastoiditis, bilateral: Secondary | ICD-10-CM | POA: Diagnosis not present

## 2019-05-20 ENCOUNTER — Other Ambulatory Visit: Payer: Self-pay

## 2019-05-20 DIAGNOSIS — Z20828 Contact with and (suspected) exposure to other viral communicable diseases: Secondary | ICD-10-CM | POA: Diagnosis not present

## 2019-05-20 DIAGNOSIS — Z20822 Contact with and (suspected) exposure to covid-19: Secondary | ICD-10-CM

## 2019-05-22 LAB — NOVEL CORONAVIRUS, NAA: SARS-CoV-2, NAA: NOT DETECTED

## 2019-05-30 ENCOUNTER — Other Ambulatory Visit: Payer: Self-pay | Admitting: Cardiology

## 2019-06-02 ENCOUNTER — Other Ambulatory Visit: Payer: Self-pay

## 2019-06-02 DIAGNOSIS — Z23 Encounter for immunization: Secondary | ICD-10-CM | POA: Diagnosis not present

## 2019-06-02 DIAGNOSIS — E1169 Type 2 diabetes mellitus with other specified complication: Secondary | ICD-10-CM | POA: Diagnosis not present

## 2019-06-02 DIAGNOSIS — E785 Hyperlipidemia, unspecified: Secondary | ICD-10-CM | POA: Diagnosis not present

## 2019-06-02 DIAGNOSIS — I779 Disorder of arteries and arterioles, unspecified: Secondary | ICD-10-CM | POA: Diagnosis not present

## 2019-06-02 DIAGNOSIS — Z20822 Contact with and (suspected) exposure to covid-19: Secondary | ICD-10-CM

## 2019-06-03 ENCOUNTER — Telehealth: Payer: Self-pay | Admitting: Cardiology

## 2019-06-03 LAB — NOVEL CORONAVIRUS, NAA: SARS-CoV-2, NAA: NOT DETECTED

## 2019-06-03 NOTE — Telephone Encounter (Signed)
Left message ( DR MORROW's  Office)to call baclk

## 2019-06-03 NOTE — Telephone Encounter (Signed)
Sandy from Dr. Darien Ramus office called. The patient was seen by Dr. Orland Mustard yesterday , and his BP was low. Dr. Orland Mustard made some changes and wanted to discuss them with Dr. Ellyn Hack

## 2019-06-08 NOTE — Telephone Encounter (Signed)
Thank you for keeping me posted.  That looks like a good plan.   Glenetta Hew, MD

## 2019-06-08 NOTE — Telephone Encounter (Signed)
Per Lovey Newcomer at Dr Darien Ramus office [facility]) called back  patient saw Dr Orland Mustard on 10 /28/20 - routine office visit  blood pressure was 89/44. Not symptomatic.  Dr Orland Mustard instructed patient to hold losartan for 2 days and record blood pressure and  call Dr Laurance Flatten office back with  Readings.   Patient's wife called Dr Darien Ramus office on 06/04/19 - blood pressure  was   101/57 , Thursday , 136/77 Friday.  Dr. Orland Mustard informed patient to restart Losartan if blood pressure in equal or greater than 140/90. Per Lovey Newcomer , Dr Orland Mustard wanted Dr Ellyn Hack to be aware. If anything is need may contact primary's office.

## 2019-07-27 ENCOUNTER — Other Ambulatory Visit: Payer: Self-pay | Admitting: Cardiology

## 2019-07-27 DIAGNOSIS — I160 Hypertensive urgency: Secondary | ICD-10-CM

## 2019-08-03 ENCOUNTER — Ambulatory Visit: Payer: Medicare Other | Attending: Internal Medicine

## 2019-08-03 DIAGNOSIS — Z20828 Contact with and (suspected) exposure to other viral communicable diseases: Secondary | ICD-10-CM | POA: Diagnosis not present

## 2019-08-03 DIAGNOSIS — Z20822 Contact with and (suspected) exposure to covid-19: Secondary | ICD-10-CM

## 2019-08-04 LAB — NOVEL CORONAVIRUS, NAA: SARS-CoV-2, NAA: NOT DETECTED

## 2019-08-22 ENCOUNTER — Other Ambulatory Visit: Payer: Self-pay | Admitting: Cardiology

## 2019-09-28 ENCOUNTER — Other Ambulatory Visit: Payer: Self-pay | Admitting: Cardiology

## 2019-10-03 ENCOUNTER — Ambulatory Visit: Payer: Medicare Other | Attending: Internal Medicine

## 2019-10-03 DIAGNOSIS — Z23 Encounter for immunization: Secondary | ICD-10-CM | POA: Insufficient documentation

## 2019-10-03 NOTE — Progress Notes (Signed)
   Covid-19 Vaccination Clinic  Name:  Damon Walker    MRN: [id number] DOB: 11-20-1945  10/03/2019  [name] was observed post Covid-19 immunization for 15 minutes without incidence. He was provided with Vaccine Information Sheet and instruction to access the V-Safe system.   [name] was instructed to call 911 with any severe reactions post vaccine: Marland Kitchen Difficulty breathing  . Swelling of your face and throat  . A fast heartbeat  . A bad rash all over your body  . Dizziness and weakness    Immunizations Administered    Name Date Dose VIS Date Route   Moderna COVID-19 Vaccine 10/03/2019  3:00 PM 0.5 mL 07/06/2019 Intramuscular   Manufacturer: Moderna   Lot: [id number]   [id number]

## 2019-10-19 ENCOUNTER — Other Ambulatory Visit: Payer: Self-pay | Admitting: *Deleted

## 2019-10-19 DIAGNOSIS — I6523 Occlusion and stenosis of bilateral carotid arteries: Secondary | ICD-10-CM

## 2019-10-20 ENCOUNTER — Telehealth: Payer: Self-pay | Admitting: Cardiology

## 2019-10-20 ENCOUNTER — Ambulatory Visit (INDEPENDENT_AMBULATORY_CARE_PROVIDER_SITE_OTHER): Payer: Medicare Other | Admitting: Physician Assistant

## 2019-10-20 ENCOUNTER — Other Ambulatory Visit: Payer: Self-pay

## 2019-10-20 ENCOUNTER — Ambulatory Visit (HOSPITAL_COMMUNITY)
Admission: RE | Admit: 2019-10-20 | Discharge: 2019-10-20 | Disposition: A | Discharge status: Home / Self Care | Payer: Medicare Other | Source: Ambulatory Visit | Attending: Vascular Surgery | Admitting: Vascular Surgery

## 2019-10-20 VITALS — BP 134/70 | HR 50 | Temp 97.9°F | Resp 16 | Ht 68.0 in | Wt 197.0 lb

## 2019-10-20 DIAGNOSIS — I6523 Occlusion and stenosis of bilateral carotid arteries: Secondary | ICD-10-CM

## 2019-10-20 NOTE — Telephone Encounter (Signed)
New Message   Patient's wife Mateo Flow) is calling in to get approved to accompany patient to his appointment with Dr. Ellyn Hack. States that patient has a hard time understanding the doctor and his medications and she needs to be at appointment with him. Please give Mateo Flow a call to confirm.

## 2019-10-20 NOTE — Progress Notes (Signed)
Office Note     CC:  follow up carotid artery stenosis Requesting Provider:  [name], MD  HPI: Damon Walker is a 74 y.o. (November 15, 1945) male who presents for routine follow-up of bilateral carotid artery stenosis.  He has been doing well without signs or symptoms of amaurosis fugax, slurred speech, extremity weakness or ataxia.  He denies lower extremity pain with exertion or rest pain  History of acute left occipital lobe infarct and STEMI in 2016.  Had PCI with stents x2. The pt is on a statin for cholesterol management.  The pt is not on a daily aspirin.   Other AC:  Plavix The pt is on BB, diuretic, nitrate, ARB for hypertension.   The pt is not diabetic.   Tobacco hx:  Former 30 pyh; quit 2016  Past Medical History:  Diagnosis Date  . Carotid arterial disease ([place]) 06/2014; 01/09/2015   Followed by Dr. Donnetta Hutching of [facility] -- a) Carotid u/s: 40-59% bilat ICA stenosis; b) Mod 50-69% Bilateral R Int Carotid (heteroenous plaque), Normal L Vert A, unable to find R Vert A.   . 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  MRI Brain: Acute L Occipital Lobe Infarct. Chronic microvascular ischemic changes in the white matter and left pons.  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 Chronic combined systolic and diastolic congestive heart failure, NYHA class 2 ([place]) 05/11/2015 - 05/15/15   a. EF 20-25% with anterior-anteroseptal akinesis (immediately post anterior STEMI).  ; b. 10/10/'16 Echo: EF 35-40% with moderate mid-apical anteroseptal, anterior and apical hypokinesis.  . Coronary artery disease involving native heart 05/11/2015   Cath: 3V CAD with 90% prox RCA, 80% mid RCA, 80% OM3, (RCA and OM residual treated medically) 100% LAD - PCI to Prox-Mid LAD with Overlapping Promus Premier DES 3.0 x 38 & 3.0 x 16 (post-dilated to 3.5 mm)   . Essential hypertension   . H/O: GI bleed 05/15/2015    s/p Sigmoid Polypectomy 05/04/2015 - prior to STEMI on May 11, 2015; Had GI Bleed following TPA for CVA, while on ASA & Brilinta; Flex Sig - 1. Internal Hemorrhoids, 2. Distal sigmoid polypectomy site with flat Whitebase status post biopsy and tattoo with 13mL spot over 3 injections 3. Proximal sigmoid polypectomy site seen as well, 4) otherwise normal with no evidence of bleed  . Hyperlipidemia with target LDL less than 70   . Ischemic cardiomyopathy 05/11/2015   EF 40% on cath 05/11/2015 after anterior STEMI, EF 20-25% on echo 05/13/2015; 12/2015 =  EF ~45%.  . Prediabetes Oct 2016   A1C 6.0 in Oct 2016  . ST elevation myocardial infarction (STEMI) involving left anterior descending (LAD) coronary artery with complication ([place]) [id number]   100% Prox LAD - PCI with overlapping DES x 2  . Tobacco abuse    a. 30 yrs - 1.5 ppd. - Quit 05/11/2015  . Vision abnormalities    Right hemianopsia    Past Surgical History:  Procedure Laterality Date  . CARDIAC CATHETERIZATION N/A 05/11/2015   Procedure: Left Heart Cath and Coronary Angiography;  Surgeon: Peter M Martinique, MD;  Location: [facility];  Service: Cardiovascular;  100% pLAD (long lesion). RCA - prox 90%, mid 80%. OM2 & OM3 80% (OM2 & 3 not necessarily PCI targets); EF 35-45% with Anterio HK  . CARDIAC CATHETERIZATION N/A 05/11/2015   Procedure: Coronary Stent Intervention;  Surgeon: Collier Salina  M Martinique, MD;  Location: [facility];  Service: Cardiovascular; PCI to Prox-Mid LAD with Overlapping Promus Premier DES 3.0 x 38 & 3.0 x 16 (post-dilated to 3.5 mm)    . FLEXIBLE SIGMOIDOSCOPY N/A 05/18/2015   Procedure: FLEXIBLE SIGMOIDOSCOPY;  Surgeon: Clarene Essex, MD;  Location: [facility];  Service: Endoscopy;  Laterality: N/A;  . NM MYOVIEW LTD  08/2016   EF 30% with large anterior-anteroseptal and apical infarct consistent with LAD infarct. His HIGH RISK because of large infarct. No ischemia.  . S/P Appendectomy     Age 19  . S/P Inguinal  Hernia Repair     In his 20's.  . TRANSTHORACIC ECHOCARDIOGRAM  05/15/2015   Mild LVH, EF 35-40% - Mod HK of mid-apical anteroseptal, anterior & apical walls.  Gr 1 DD. Mild bilateral Atrial Enlargement.  . TRANSTHORACIC ECHOCARDIOGRAM  12/2015   EF up to ~40-45% wiht Anterior-Anteroapical HK.  Mod LV dilation with increased LVEDP.  Marland Kitchen TRANSTHORACIC ECHOCARDIOGRAM  12/'17; 4/'18   a) EF ~30-35% (in setting of HTN Urgency). - Gr 2 DD;; b) EF 30-35% (per Dr. Sallyanne Kuster - 35-40%). Anterior-anteroapical Akinesis. Gr2 DD w/ high filling pressures. Mild LA dilation. Mild RV dilation.    Social History   Socioeconomic History  . Marital status: Married    Spouse name: Not on file  . Number of children: 3  . Years of education: Not on file  . Highest education level: Not on file  Occupational History  . Occupation: retired from [employer]  . Smoking status: Former Smoker    Packs/day: 1.00    Years: 30.00    Pack years: 30.00    Types: Cigarettes    Quit date: 04/06/2015    Years since quitting: 4.5  . Smokeless tobacco: Never Used  . Tobacco comment: Quit the day of his STEMI  Substance and Sexual Activity  . Alcohol use: No    Alcohol/week: 0.0 standard drinks  . Drug use: No  . Sexual activity: Not on file  Other Topics Concern  . Not on file  Social History Narrative   Lives in [place] with wife. Originally from [place].   Social Determinants of Health   Financial Resource Strain:   . Difficulty of Paying Living Expenses:   Food Insecurity:   . Worried About Charity fundraiser in the Last Year:   . Arboriculturist in the Last Year:   Transportation Needs:   . Film/video editor (Medical):   Marland Kitchen Lack of Transportation (Non-Medical):   Physical Activity:   . Days of Exercise per Week:   . Minutes of Exercise per Session:   Stress:   . Feeling of Stress :   Social Connections:   . Frequency of Communication with Friends and Family:   . Frequency of  Social Gatherings with Friends and Family:   . Attends Religious Services:   . Active Member of Clubs or Organizations:   . Attends Archivist Meetings:   Marland Kitchen Marital Status:   Intimate Partner Violence:   . Fear of Current or Ex-Partner:   . Emotionally Abused:   Marland Kitchen Physically Abused:   . Sexually Abused:     Family History  Adopted: Yes  Problem Relation Age of Onset  . Other Mother        eczema   . Cancer Mother   . Other Other        Adopted - unaware of biological  parent's histories.    Current Outpatient Medications  Medication Sig Dispense Refill  . carvedilol (COREG) 12.5 MG tablet Take 1 tablet (12.5 mg total) by mouth 2 (two) times daily. **Needs OV** 60 tablet 1  . clopidogrel (PLAVIX) 75 MG tablet Take 1 tablet (75 mg total) by mouth daily. 30 tablet 1  . ferrous sulfate 325 (65 FE) MG tablet Take 325 mg by mouth daily with breakfast.    . furosemide (LASIX) 20 MG tablet TAKE 1 TABLET BY MOUTH ONCE DAILY 30 tablet 11  . isosorbide mononitrate (IMDUR) 30 MG 24 hr tablet Take 1/2 (one-half) tablet by mouth once daily 45 tablet 3  . losartan (COZAAR) 25 MG tablet Take 1 tablet (25 mg total) by mouth daily. **Needs OV** 60 tablet 1  . nitroGLYCERIN (NITROSTAT) 0.4 MG SL tablet Place 1 tablet (0.4 mg total) under the tongue every 5 (five) minutes x 3 doses as needed for chest pain. 25 tablet 3  . pantoprazole (PROTONIX) 40 MG tablet Take 40 mg by mouth daily.     . rosuvastatin (CRESTOR) 40 MG tablet Take 1 tablet (40 mg total) by mouth daily at 6 PM. 90 tablet 3   No current facility-administered medications for this visit.    Allergies  Allergen Reactions  . Latex Itching, Rash and Other (See Comments)    Blisters     REVIEW OF SYSTEMS:   [X]  denotes positive finding, [ ]  denotes negative finding Cardiac  Comments:  Chest pain or chest pressure:    Shortness of breath upon exertion:    Short of breath when lying flat:    Irregular heart rhythm:         Vascular    Pain in calf, thigh, or hip brought on by ambulation:    Pain in feet at night that wakes you up from your sleep:     Blood clot in your veins:    Leg swelling:         Pulmonary    Oxygen at home:    Productive cough:     Wheezing:         Neurologic    Sudden weakness in arms or legs:     Sudden numbness in arms or legs:     Sudden onset of difficulty speaking or slurred speech:    Temporary loss of vision in one eye:     Problems with dizziness:         Gastrointestinal    Blood in stool:     Vomited blood:         Genitourinary    Burning when urinating:     Blood in urine:        Psychiatric    Major depression:         Hematologic    Bleeding problems:    Problems with blood clotting too easily:        Skin    Rashes or ulcers:        Constitutional    Fever or chills:      PHYSICAL EXAMINATION:  Vitals:   10/20/19 1311  Weight: 197 lb (89.4 kg)  Height: 5\' 8"  (1.727 m)   General:  WDWN in NAD; vital signs documented above Gait: steady, no ataxia HENT: WNL, normocephalic Pulmonary: normal non-labored breathing , without Rales, rhonchi,  wheezing Cardiac: regular HR, without  Murmurs without carotid bruits Abdomen: soft, NT, no masses Skin: without rashes Vascular Exam/Pulses: Extremities: without ischemic changes, without  Gangrene , without cellulitis; without open wounds; feet are warm and well perfused Musculoskeletal: no muscle wasting or atrophy  Neurologic: A&O X 3;  No focal weakness or paresthesias are detected Psychiatric:  The pt has Normal affect.  Pulse exam: The patient has palpable 2+ brachial, radial, femoral and popliteal pulses bilaterally.  Nonpalpable pedal pulses on the right.  2+ left posterior tibial pulse.  Non-Invasive Vascular Imaging:   Carotid Duplex on 08/31/2018: Right:  1-39% stenosis Left:  40-59% stenosis Vertebrals: Left vertebral artery demonstrates antegrade flow. Right vertebral artery  demonstrates no discernable flow. Subclavians: Normal flow hemodynamics were seen in bilateral subclavian arteries  TODAY (10-20-2019) Right Carotid: Velocities in the right ICA are consistent with a 1-39%  stenosis.   Left Carotid: Velocities in the left ICA are consistent with a 40-59%  stenosis.   Vertebrals: Left vertebral artery demonstrates antegrade flow. Right  vertebral artery demonstrates no discernable flow.   Subclavians: Normal flow hemodynamics were seen in bilateral subclavian        arteries.     ASSESSMENT/PLAN:: 74 y.o. male here for follow up for bilateral carotid artery stenosis.  The patient's duplex ultrasound examination is stable.  We discussed continuing his medication as prescribed.  Reviewed signs and symptoms of stroke/TIA.  Follow-up in 1 year with duplex ultrasound    Barbie Banner, PA-C [facility] [phone]  Clinic MD:   Scot Dock

## 2019-10-20 NOTE — Telephone Encounter (Signed)
Spoke with patient's spouse. Okayed spouse to accompany patient to appointment with Dr. Ellyn Hack. Understanding verbalized.

## 2019-10-21 ENCOUNTER — Other Ambulatory Visit: Payer: Self-pay | Admitting: *Deleted

## 2019-10-21 ENCOUNTER — Encounter: Payer: Self-pay | Admitting: Cardiology

## 2019-10-21 ENCOUNTER — Ambulatory Visit: Payer: Medicare Other | Admitting: Cardiology

## 2019-10-21 VITALS — BP 100/63 | HR 58 | Temp 95.4°F | Ht 67.0 in | Wt 199.2 lb

## 2019-10-21 DIAGNOSIS — I739 Peripheral vascular disease, unspecified: Secondary | ICD-10-CM

## 2019-10-21 DIAGNOSIS — I25119 Atherosclerotic heart disease of native coronary artery with unspecified angina pectoris: Secondary | ICD-10-CM | POA: Diagnosis not present

## 2019-10-21 DIAGNOSIS — I10 Essential (primary) hypertension: Secondary | ICD-10-CM | POA: Diagnosis not present

## 2019-10-21 DIAGNOSIS — I6523 Occlusion and stenosis of bilateral carotid arteries: Secondary | ICD-10-CM

## 2019-10-21 DIAGNOSIS — Z955 Presence of coronary angioplasty implant and graft: Secondary | ICD-10-CM

## 2019-10-21 DIAGNOSIS — E785 Hyperlipidemia, unspecified: Secondary | ICD-10-CM | POA: Diagnosis not present

## 2019-10-21 DIAGNOSIS — I2109 ST elevation (STEMI) myocardial infarction involving other coronary artery of anterior wall: Secondary | ICD-10-CM

## 2019-10-21 DIAGNOSIS — I255 Ischemic cardiomyopathy: Secondary | ICD-10-CM

## 2019-10-21 DIAGNOSIS — Z87891 Personal history of nicotine dependence: Secondary | ICD-10-CM

## 2019-10-21 NOTE — Progress Notes (Signed)
Primary Care Provider: [name], MD Cardiologist: Glenetta Hew, MD Electrophysiologist: None  Clinic Note: Chief Complaint  Patient presents with  . Follow-up    1 year  . Coronary Artery Disease    No active angina  . Cardiomyopathy    Improved EF, no active CHF  . Hypotension    Resolved now.    HPI:    Damon Walker is a 74 y.o. male with a PMH noted below who presents today for annual follow-up.  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  [name] was last seen in February 2020.  Maybe noted a little exertional dyspnea with overexertion.  Energy overall is better.  Limited by hip pain.  No chest pain or pressure with rest or exertion.  Stop aspirin, continue Plavix carvedilol, statin, losartan and Imdur.  (Potentially consider stopping Imdur 12 months).  Recent Hospitalizations:   None  Reviewed  CV studies:    The following studies were reviewed today: (if available, images/films reviewed: From Epic Chart or Care Everywhere) . Carotid Dopplers March 17, [id number]: R ICA 123456, LICA 123456.  Right vertebral artery appears occluded.  Normal subclavian flow.  Normal left vertebral artery. -->  No change since 2020   Interval History:   Damon Walker returns here today with his wife indicating he is doing quite well.  He still is limited by his hip pain.  (Has been diagnosed with trochanteric bursitis).  It really limits his ability to walk, but he is still somewhat active.  Major issue is that he notes when he saw his PCP, his blood pressure was down about 80/60 and he was told to hold multiple medications.  He and his wife are very concerned because they were not sure if they should continue losartan and carvedilol.  He was told  to hold losartan.  He never really noticed any lightheadedness dizziness associated with that, but has noted that sometimes in the morning he is a little lightheaded.  He has not had any syncope or near syncope but has had some lightheadedness.--The directions were actually to hold losartan for 2 days, follow-up blood pressures.  At that point.  Blood pressures were better and the plan was for him to restart ARB. He is only taking his Lasix as needed the next may be once or twice a week at the most.  No PND orthopnea. With the amount of activity he is doing, he has not had any chest pain or pressure.  Also no arrhythmia symptoms.  CV Review of Symptoms (Summary): positive for - Some episodes of low blood pressure, not overly symptomatic. negative for - chest pain, dyspnea on exertion, edema, irregular heartbeat, orthopnea, palpitations, paroxysmal nocturnal dyspnea, rapid heart rate, shortness of breath or Syncope/near syncope, TIA/amaurosis fugax, claudication.  The patient does not have symptoms concerning for COVID-19 infection (fever, chills, cough, or new shortness of breath).  The patient is practicing social distancing & Masking.    REVIEWED OF SYSTEMS   Review of Systems  Constitutional: Negative for malaise/fatigue (Not very active, but does not notice fatigue) and weight loss.  HENT: Negative for congestion and nosebleeds.   Respiratory: Negative for shortness of breath.   Gastrointestinal: Negative for abdominal pain, blood in stool, heartburn and melena.  Genitourinary: Negative for hematuria.  Musculoskeletal: Positive for back pain and joint pain (Mostly right hip). Negative for falls.  Neurological: Negative for dizziness.  Psychiatric/Behavioral: Negative for depression and memory loss. The patient is not nervous/anxious and does not have insomnia.    I have reviewed and (if needed) personally updated the patient's problem list, medications, allergies, past medical and surgical  history, social and family history.   PAST MEDICAL HISTORY   Past Medical History:  Diagnosis Date  . Carotid arterial disease ([place]) 06/2014; 01/09/2015   Followed by Dr. Donnetta Hutching of [facility] -- a) Carotid u/s: 40-59% bilat ICA stenosis; b) Mod 50-69% Bilateral R Int Carotid (heteroenous plaque), Normal L Vert A, unable to find R Vert A.   . 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 ([place]) 05/14/2015   -- Given TPA.  MRI Brain: Acute L Occipital Lobe Infarct. Chronic microvascular ischemic changes in the white matter and left pons.  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 Chronic combined systolic and diastolic congestive heart failure, NYHA class 2 ([place]) 05/11/2015 - 05/15/15   a. EF 20-25% with anterior-anteroseptal akinesis (immediately post anterior STEMI).  ; b. 10/10/'16 Echo: EF 35-40% with moderate mid-apical anteroseptal, anterior and apical hypokinesis.  . Coronary artery disease involving native heart 05/11/2015   Cath: 3V CAD with 90% prox RCA, 80% mid RCA, 80% OM3, (RCA and OM residual treated medically) 100% LAD - PCI to Prox-Mid LAD with Overlapping Promus Premier DES 3.0 x 38 & 3.0 x 16 (post-dilated to 3.5 mm)   . Essential hypertension   . H/O: GI bleed 05/15/2015   s/p Sigmoid Polypectomy 05/04/2015 - prior to STEMI on May 11, 2015; Had GI Bleed following TPA for CVA, while on ASA & Brilinta; Flex Sig - 1. Internal Hemorrhoids, 2. Distal sigmoid polypectomy site with flat Whitebase status post biopsy and tattoo with 61mL spot over 3 injections 3. Proximal sigmoid polypectomy site seen as well, 4) otherwise normal with no evidence of bleed  . Hyperlipidemia with target LDL less than 70   . Ischemic cardiomyopathy 05/11/2015   EF 40% on cath 05/11/2015 after anterior STEMI, EF 20-25% on echo 05/13/2015; 12/2015 =  EF ~45%.  . Prediabetes Oct 2016   A1C 6.0 in Oct 2016  . ST elevation myocardial infarction (STEMI)  involving left anterior descending (LAD) coronary artery with complication ([place]) [id number]   100% Prox LAD - PCI with overlapping DES x 2  . Tobacco abuse    a. 30 yrs - 1.5 ppd. - Quit 05/11/2015  . Vision abnormalities    Right hemianopsia     PAST SURGICAL HISTORY   Past Surgical History:  Procedure Laterality Date  . CARDIAC CATHETERIZATION N/A 05/11/2015   Procedure: Left Heart Cath and Coronary Angiography;  Surgeon: Peter M Martinique, MD;  Location: [facility];  Service: Cardiovascular;  100% pLAD (long lesion). RCA - prox 90%, mid 80%. OM2 & OM3 80% (OM2 & 3 not necessarily PCI targets); EF 35-45% with Anterio HK  . CARDIAC CATHETERIZATION N/A 05/11/2015   Procedure: Coronary Stent Intervention;  Surgeon: Peter M Martinique, MD;  Location: [place] [facility];  Service: Cardiovascular; PCI to Prox-Mid LAD with Overlapping Promus Premier DES 3.0 x 38 & 3.0 x 16 (post-dilated to 3.5 mm)    . FLEXIBLE SIGMOIDOSCOPY N/A 05/18/2015   Procedure: FLEXIBLE SIGMOIDOSCOPY;  Surgeon: Clarene Essex, MD;  Location: [facility] [place] [facility];  Service: Endoscopy;  Laterality: N/A;  .  NM MYOVIEW LTD  08/2016   EF 30% with large anterior-anteroseptal and apical infarct consistent with LAD infarct. His HIGH RISK because of large infarct. No ischemia.  . S/P Appendectomy     Age 70  . S/P Inguinal Hernia Repair     In his 20's.  . TRANSTHORACIC ECHOCARDIOGRAM  05/15/2015   Mild LVH, EF 35-40% - Mod HK of mid-apical anteroseptal, anterior & apical walls.  Gr 1 DD. Mild bilateral Atrial Enlargement.  . TRANSTHORACIC ECHOCARDIOGRAM  12/2015   EF up to ~40-45% wiht Anterior-Anteroapical HK.  Mod LV dilation with increased LVEDP.  Marland Kitchen TRANSTHORACIC ECHOCARDIOGRAM  12/'17; 4/'18   a) EF ~30-35% (in setting of HTN Urgency). - Gr 2 DD;; b) EF 30-35% (per Dr. Sallyanne Kuster - 35-40%). Anterior-anteroapical Akinesis. Gr2 DD w/ high filling pressures. Mild LA dilation. Mild RV dilation.    MEDICATIONS/ALLERGIES   Current Meds    Medication Sig  . carvedilol (COREG) 12.5 MG tablet Take 1 tablet (12.5 mg total) by mouth 2 (two) times daily. **Needs OV**  . clopidogrel (PLAVIX) 75 MG tablet Take 1 tablet (75 mg total) by mouth daily.  . ferrous sulfate 325 (65 FE) MG tablet Take 325 mg by mouth daily with breakfast.  . furosemide (LASIX) 20 MG tablet TAKE 1 TABLET BY MOUTH ONCE DAILY  . isosorbide mononitrate (IMDUR) 30 MG 24 hr tablet Take 1/2 (one-half) tablet by mouth once daily  . losartan (COZAAR) 25 MG tablet Take 1 tablet (25 mg total) by mouth daily. **Needs OV**  . nitroGLYCERIN (NITROSTAT) 0.4 MG SL tablet Place 1 tablet (0.4 mg total) under the tongue every 5 (five) minutes x 3 doses as needed for chest pain.  . pantoprazole (PROTONIX) 40 MG tablet Take 40 mg by mouth daily.   . rosuvastatin (CRESTOR) 40 MG tablet Take 1 tablet (40 mg total) by mouth daily at 6 PM.    Allergies  Allergen Reactions  . Latex Itching, Rash and Other (See Comments)    Blisters    SOCIAL HISTORY/FAMILY HISTORY   Reviewed in Epic:  Pertinent findings: N/A.  OBJCTIVE -PE, EKG, labs   Wt Readings from Last 3 Encounters:  10/21/19 199 lb 3.2 oz (90.4 kg)  10/20/19 197 lb (89.4 kg)  09/09/18 196 lb 9.6 oz (89.2 kg)    Physical Exam: BP 100/63   Pulse (!) 58   Temp (!) 95.4 F (35.2 C)   Ht 5\' 7"  (1.702 m)   Wt 199 lb 3.2 oz (90.4 kg)   SpO2 96%   BMI 31.20 kg/m  Physical Exam  Constitutional: He is oriented to person, place, and time. He appears well-developed and well-nourished. No distress.  Healthy-appearing.  Well-groomed.  HENT:  Head: Normocephalic and atraumatic.  Neck: No hepatojugular reflux and no JVD present. Carotid bruit is present (Right).  Cardiovascular: Normal rate, regular rhythm, normal heart sounds and intact distal pulses.  No extrasystoles are present. PMI is not displaced. Exam reveals no gallop and no friction rub.  No murmur heard. Pulmonary/Chest: Effort normal and breath sounds  normal. No respiratory distress. He has no wheezes. He has no rales.  Abdominal: Soft. Bowel sounds are normal. There is no abdominal tenderness. There is no rebound.  No obvious HSM.  Musculoskeletal:        General: No edema. Normal range of motion.     Cervical back: Normal range of motion and neck supple.  Neurological: He is alert and oriented to person, place, and time.  Psychiatric: He has a normal mood and affect. His behavior is normal. Judgment and thought content normal.  Vitals reviewed.    Adult ECG Report  Rate: 58 ;  Rhythm: sinus bradycardia and Septal MI, age-indeterminate.  ST and T wave changes cannot exclude inferior ischemia.  Otherwise normal axis, intervals durations.;   Narrative Interpretation: Stable EKG.  Recent Labs: From April 2020: TC 154, TG 278, HDL 38, LDL 60.  October 2020: A1c 6.2.  Hgb 13.6.  CR 1.49.  K+ 5.1. Lab Results  Component Value Date   CHOL 109 07/08/2016   HDL 39 (L) 07/08/2016   LDLCALC 54 07/08/2016   TRIG 78 07/08/2016   CHOLHDL 2.8 07/08/2016   Lab Results  Component Value Date   CREATININE 1.15 05/30/2017   BUN 21 05/30/2017   NA 144 05/30/2017   K 5.2 05/30/2017   CL 103 05/30/2017   CO2 27 05/30/2017   Lab Results  Component Value Date   TSH 1.006 05/11/2015    ASSESSMENT/PLAN    Problem List Items Addressed This Visit    ST elevation myocardial infarction (STEMI) of anterior wall, subsequent episode of care ([place]) (Chronic)    Unfortunately, delayed presentation of anterior MI with large anterolateral infarct noted on echo and Myoview.  Thankfully, follow-up echocardiogram does show improved EF after initial drop.  Up to 35 and 40%.  Despite having reduced EF, he does not have much in way of any heart failure symptoms.  No further angina symptoms.      Presence of drug coated stent in LAD coronary artery (Chronic)    On maintenance dose Plavix for both CAD and stroke prevention.  Plan lifelong Plavix alone  without aspirin.  Okay to hold Plavix 5 to 7 days preop for surgeries or procedures.      Cardiomyopathy, ischemic: EF most recently 35-40%) (Chronic)    He is declined ICD referral.  His EF is right at the borderline.  Financially, unable to afford Entresto.  Back to ARB and carvedilol. Is on basically as needed Lasix.  Has had some issues with low blood pressures.  We will have him wait until lunchtime to take his losartan.  Hold for blood pressure less than 100.      CAD-native artery (Chronic)    Large anterior MI with LAD stent.  Also pretty significant RCA disease treated medically.  No recurrent angina.  Stress test done in 2018 did not show any evidence of inferior ischemia, therefore we have chosen to treat the RCA medically.  Plan:   Continue with clopidogrel without aspirin. ->  Okay to hold clopidogrel/Plavix for procedures or surgeries for 5 to 7 days.  I think we can probably wean off Imdur.  Continue beta-blocker ARB and statin.  Refill as needed nitroglycerin.      Essential hypertension - Primary (Chronic)    He has had stable blood pressure.  Currently on ARB and carvedilol.  He did not really tolerate Entresto, blood pressure standpoint, but also potentially.  He did have some hypotension when he went to see his PCP, and his pressure is currently a bit low today.  He tells me at home his pressures are much better usually in the [id number] range systolic.  Plan: I have asked that he wait until lunchtime to take his losartan, and check his blood pressures prior to taking it.  Hold for systolic less than 123XX123 mmHg.      Relevant Orders   EKG 12-Lead  Hyperlipidemia with target LDL less than 70 (Chronic)    Due for labs to be checked again this year.  He is probably seeing his PCP in the next month or so.  I will just wait for those labs.  Had been at goal before on current dose of rosuvastatin.  Had low threshold to consider Zetia in the past.      Carotid arterial  disease ([facility]) (Chronic)    Moderate disease noted.  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 Has been followed by vascular surgery. Continue blood pressure, lipid management.  Also continue Plavix.      Former heavy cigarette smoker (20-39 per day)    Doing well.  Has not had a desire to smoke      Claudication [facility])    He does not walk a lot, but I am concerned that his buttock pain and hip pain may potentially be related to buttock claudication.  He clearly has symptoms of trochanteric bursitis, but want to exclude PAD since he has carotid and coronary disease. Plan: Aortic- iliac Dopplers.      Relevant Orders   VAS US AORTA/IVC/ILIACS      COVID-19 Education: The signs and symptoms of COVID-19 were discussed with the patient and how to seek care for testing (follow up with PCP or arrange E-visit).   The importance of social distancing was discussed today.  I spent a total of 22 minutes with the patient. >  50% of the time was spent in direct patient consultation.  Additional time spent with chart review  / charting (studies, outside notes, etc): 6 Total Time: 28 min   Current medicines are reviewed at length with the patient today.  (+/- concerns) wanted to confirm dosing of blood pressure meds.   Patient Instructions / Medication Changes & Studies & Tests Ordered   Patient Instructions  Medication Instructions:     can stop taking Isosorbide ( Imdur)  If you have to use Nitroglycerin then restart taking Isosorbide.  Continue taking Losartan  If blood pressure is less than 100/ ? Do not take it that day    *If you need a refill on your cardiac medications before your next appointment, please call your pharmacy*   Lab Work: Not needed    Testing/Procedures:   Follow-Up: At [facility], you and your health needs are our priority.  As part of our continuing mission to provide you with exceptional heart care, we have created designated Provider Care Teams.   These Care Teams include your primary Cardiologist (physician) and Advanced Practice Providers (APPs -  Physician Assistants and Nurse Practitioners) who all work together to provide you with the care you need, when you need it.  We recommend signing up for the patient portal called "MyChart".  Sign up information is provided on this After Visit Summary.  MyChart is used to connect with patients for Virtual Visits (Telemedicine).  Patients are able to view lab/test results, encounter notes, upcoming appointments, etc.  Non-urgent messages can be sent to your provider as well.   To learn more about what you can do with MyChart, go to [email or web address].    Your next appointment:   1 month(s)  The format for your next appointment:   In Person  Provider:   Glenetta Hew, MD   Other Instructions    Studies Ordered:   Orders Placed This Encounter  Procedures  . EKG 12-Lead  . VAS US AORTA/IVC/ILIACS     Glenetta Hew, M.D., M.S.  Interventional Cardiologist   Pager # [phone] Phone # [phone] [address] [place], [place] [address]   Thank you for choosing Heartcare at [facility]!!

## 2019-10-21 NOTE — Patient Instructions (Addendum)
Medication Instructions:     can stop taking Isosorbide ( Imdur)  If you have to use Nitroglycerin then restart taking Isosorbide.  Continue taking Losartan  If blood pressure is less than 100/ ? Do not take it that day    *If you need a refill on your cardiac medications before your next appointment, please call your pharmacy*   Lab Work: Not needed    Testing/Procedures:   Follow-Up: At [facility], you and your health needs are our priority.  As part of our continuing mission to provide you with exceptional heart care, we have created designated Provider Care Teams.  These Care Teams include your primary Cardiologist (physician) and Advanced Practice Providers (APPs -  Physician Assistants and Nurse Practitioners) who all work together to provide you with the care you need, when you need it.  We recommend signing up for the patient portal called "MyChart".  Sign up information is provided on this After Visit Summary.  MyChart is used to connect with patients for Virtual Visits (Telemedicine).  Patients are able to view lab/test results, encounter notes, upcoming appointments, etc.  Non-urgent messages can be sent to your provider as well.   To learn more about what you can do with MyChart, go to [email or web address].    Your next appointment:   1 month(s)  The format for your next appointment:   In Person  Provider:   Glenetta Hew, MD   Other Instructions

## 2019-10-24 ENCOUNTER — Encounter: Payer: Self-pay | Admitting: Cardiology

## 2019-10-24 DIAGNOSIS — I739 Peripheral vascular disease, unspecified: Secondary | ICD-10-CM | POA: Insufficient documentation

## 2019-10-24 NOTE — Assessment & Plan Note (Signed)
Due for labs to be checked again this year.  He is probably seeing his PCP in the next month or so.  I will just wait for those labs.  Had been at goal before on current dose of rosuvastatin.  Had low threshold to consider Zetia in the past.

## 2019-10-24 NOTE — Assessment & Plan Note (Signed)
Doing well.  Has not had a desire to smoke

## 2019-10-24 NOTE — Assessment & Plan Note (Signed)
On maintenance dose Plavix for both CAD and stroke prevention.  Plan lifelong Plavix alone without aspirin.  Okay to hold Plavix 5 to 7 days preop for surgeries or procedures.

## 2019-10-24 NOTE — Assessment & Plan Note (Signed)
He is declined ICD referral.  His EF is right at the borderline.  Financially, unable to afford Entresto.  Back to ARB and carvedilol. Is on basically as needed Lasix.  Has had some issues with low blood pressures.  We will have him wait until lunchtime to take his losartan.  Hold for blood pressure less than 100.

## 2019-10-24 NOTE — Assessment & Plan Note (Signed)
Unfortunately, delayed presentation of anterior MI with large anterolateral infarct noted on echo and Myoview.  Thankfully, follow-up echocardiogram does show improved EF after initial drop.  Up to 35 and 40%.  Despite having reduced EF, he does not have much in way of any heart failure symptoms.  No further angina symptoms.

## 2019-10-24 NOTE — Assessment & Plan Note (Signed)
Large anterior MI with LAD stent.  Also pretty significant RCA disease treated medically.  No recurrent angina.  Stress test done in 2018 did not show any evidence of inferior ischemia, therefore we have chosen to treat the RCA medically.  Plan:   Continue with clopidogrel without aspirin. ->  Okay to hold clopidogrel/Plavix for procedures or surgeries for 5 to 7 days.  I think we can probably wean off Imdur.  Continue beta-blocker ARB and statin.  Refill as needed nitroglycerin.

## 2019-10-24 NOTE — Assessment & Plan Note (Signed)
Moderate disease noted.  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 Has been followed by vascular surgery. Continue blood pressure, lipid management.  Also continue Plavix.

## 2019-10-24 NOTE — Assessment & Plan Note (Signed)
He has had stable blood pressure.  Currently on ARB and carvedilol.  He did not really tolerate Entresto, blood pressure standpoint, but also potentially.  He did have some hypotension when he went to see his PCP, and his pressure is currently a bit low today.  He tells me at home his pressures are much better usually in the [id number] range systolic.  Plan: I have asked that he wait until lunchtime to take his losartan, and check his blood pressures prior to taking it.  Hold for systolic less than 123XX123 mmHg.

## 2019-10-24 NOTE — Assessment & Plan Note (Signed)
He does not walk a lot, but I am concerned that his buttock pain and hip pain may potentially be related to buttock claudication.  He clearly has symptoms of trochanteric bursitis, but want to exclude PAD since he has carotid and coronary disease. Plan: Aortic- iliac Dopplers.

## 2019-10-29 ENCOUNTER — Telehealth: Payer: Self-pay | Admitting: Cardiology

## 2019-10-29 NOTE — Telephone Encounter (Signed)
Left message for patient to call and schedule 1 month follow up with Dr. Ellyn Hack

## 2019-11-01 NOTE — Telephone Encounter (Signed)
Left message for patient to call and schedule 1 month follow up appt with Dr. Ellyn Hack

## 2019-11-02 ENCOUNTER — Encounter: Payer: Self-pay | Admitting: Cardiology

## 2019-11-02 NOTE — Telephone Encounter (Signed)
Left message for patient to call and schedule 1 month follow up appointment with Dr. Ellyn Hack.  Will mail letter requesting patient to call and schedule

## 2019-11-04 ENCOUNTER — Other Ambulatory Visit: Payer: Self-pay

## 2019-11-04 ENCOUNTER — Ambulatory Visit (HOSPITAL_COMMUNITY)
Admission: RE | Admit: 2019-11-04 | Discharge: 2019-11-04 | Disposition: A | Discharge status: Home / Self Care | Payer: Medicare Other | Source: Ambulatory Visit | Attending: Cardiology | Admitting: Cardiology

## 2019-11-04 ENCOUNTER — Encounter (HOSPITAL_COMMUNITY): Payer: Self-pay

## 2019-11-04 DIAGNOSIS — K7460 Unspecified cirrhosis of liver: Secondary | ICD-10-CM

## 2019-11-04 DIAGNOSIS — I739 Peripheral vascular disease, unspecified: Secondary | ICD-10-CM | POA: Diagnosis not present

## 2019-11-04 DIAGNOSIS — K8021 Calculus of gallbladder without cholecystitis with obstruction: Secondary | ICD-10-CM

## 2019-11-04 HISTORY — DX: Unspecified cirrhosis of liver: K74.60

## 2019-11-04 HISTORY — DX: Calculus of gallbladder without cholecystitis with obstruction: K80.21

## 2019-11-06 ENCOUNTER — Ambulatory Visit: Payer: Medicare Other | Attending: Internal Medicine

## 2019-11-06 DIAGNOSIS — Z23 Encounter for immunization: Secondary | ICD-10-CM

## 2019-11-06 NOTE — Progress Notes (Signed)
   Covid-19 Vaccination Clinic  Name:  Damon Walker    MRN: [id number] DOB: 19-Nov-1945  11/06/2019  [name] was observed post Covid-19 immunization for 15 minutes without incident. He was provided with Vaccine Information Sheet and instruction to access the V-Safe system.   [name] was instructed to call 911 with any severe reactions post vaccine: Marland Kitchen Difficulty breathing  . Swelling of face and throat  . A fast heartbeat  . A bad rash all over body  . Dizziness and weakness   Immunizations Administered    Name Date Dose VIS Date Route   Moderna COVID-19 Vaccine 11/06/2019 12:51 PM 0.5 mL 07/06/2019 Intramuscular   Manufacturer: Moderna   Lot: [id number]   [id number]

## 2019-11-15 ENCOUNTER — Telehealth: Payer: Self-pay | Admitting: *Deleted

## 2019-11-15 DIAGNOSIS — I739 Peripheral vascular disease, unspecified: Secondary | ICD-10-CM

## 2019-11-15 DIAGNOSIS — I25119 Atherosclerotic heart disease of native coronary artery with unspecified angina pectoris: Secondary | ICD-10-CM

## 2019-11-15 NOTE — Telephone Encounter (Signed)
-----   Message from Leonie Man, MD sent at 11/04/2019  5:12 PM EDT ----- Interesting results on the aortoiliac Dopplers.  They did not get a great view, did not seem to have any significant evidence of aneurysm. There was some slight increased blood flow velocity in the femoral arteries, and there was also some type of abnormal finding in the left upper abdomen that needs to be better assessed.  I would like to check a CTA abdomen pelvis - with & without contrast (no PO contrast) to get a better look @ potential stenosis & also evaluate the HyperEchoic structure in the L Upper Abdomen.    Reader also suggested checking ABIs to assess for Femoral Artery Disease.  -- not unreasonable.  Glenetta Hew, MD

## 2019-11-15 NOTE — Telephone Encounter (Signed)
Uber is returning phone call

## 2019-11-15 NOTE — Telephone Encounter (Signed)
The patient has been notified of the result and verbalized understanding.  All questions (if any) were answered. Aware scheduling  F/u ct scan and ABI Raiford Simmonds, RN 11/15/2019 6:56 PM

## 2019-11-19 NOTE — Telephone Encounter (Signed)
OPEN ERROR

## 2019-11-19 NOTE — Addendum Note (Signed)
Addended by: Raiford Simmonds on: 11/19/2019 01:13 PM   Modules accepted: Orders

## 2019-11-23 ENCOUNTER — Telehealth: Payer: Self-pay

## 2019-11-23 ENCOUNTER — Other Ambulatory Visit: Payer: Self-pay

## 2019-11-23 ENCOUNTER — Encounter: Payer: Self-pay | Admitting: Pharmacist

## 2019-11-23 ENCOUNTER — Ambulatory Visit (INDEPENDENT_AMBULATORY_CARE_PROVIDER_SITE_OTHER): Payer: Medicare Other | Admitting: Pharmacist

## 2019-11-23 VITALS — BP 96/60 | HR 58 | Temp 98.1°F | Ht 67.0 in | Wt 198.6 lb

## 2019-11-23 DIAGNOSIS — I10 Essential (primary) hypertension: Secondary | ICD-10-CM | POA: Diagnosis not present

## 2019-11-23 DIAGNOSIS — I25119 Atherosclerotic heart disease of native coronary artery with unspecified angina pectoris: Secondary | ICD-10-CM

## 2019-11-23 DIAGNOSIS — Z01818 Encounter for other preprocedural examination: Secondary | ICD-10-CM

## 2019-11-23 NOTE — Progress Notes (Signed)
Patient ID: Damon Walker                 DOB: July 31, 1946                      MRN: [id number]     HPI: Damon Walker is a 74 y.o. male referred by Dr. Ellyn Hack to HTN clinic. PMH includes hypertension, CAD, STEMI, cardiomyopathy with EF 35-40%, CKD-III, claudication, and hyperlipidemia. Noted recent LDL at 60mg /dL. Patient was unableto tolerate Entresto and noted few episodes of asymptomatic hypotension with current therapy. Imdur was discontinued during last OV with Dr Ellyn Hack and instructions were given to HOLD losartan if SBP <11mmHg.  Patient present to clinic accompany by his wife. Patient denies dizziness, swelling, shortness of breath, weight gain or problems with his current therapy. He was able to stop taking IMDUR and denies need to NTG.   Current HTN meds:  Carvedilol 12.5mg  twice daily Losartan 25mg  daily  BP goal: <130/80  Family History: family history includes Cancer in his mother; Other in his mother and another family member. He was adopted  Social History: former smoker, denies alcohol use  Diet: very low sodium diet   Exercise: activities of daily activities  Home BP readings: average home readings 125/60s - all in the evening around 7pm Omron intelli-sense arm cuff (93/56) (57) - accurate within 44mmHg from manual readings  Wt Readings from Last 3 Encounters:  11/23/19 198 lb 9.6 oz (90.1 kg)  10/21/19 199 lb 3.2 oz (90.4 kg)  10/20/19 197 lb (89.4 kg)   BP Readings from Last 3 Encounters:  11/23/19 96/60  10/21/19 100/63  10/20/19 134/70   Pulse Readings from Last 3 Encounters:  11/23/19 (!) 58  10/21/19 (!) 58  10/20/19 (!) 50    Past Medical History:  Diagnosis Date  . Carotid arterial disease ([place]) 06/2014; 01/09/2015   Followed by Dr. Donnetta Hutching of [facility] -- a) Carotid u/s: 40-59% bilat ICA stenosis; b) Mod 50-69% Bilateral R Int Carotid (heteroenous plaque), Normal L Vert A, unable to find R Vert A.   . Cerebral infarction involving left posterior  cerebral artery ([place]) 05/14/2015   -- Given TPA.  MRI Brain: Acute L Occipital Lobe Infarct. Chronic microvascular ischemic changes in the white matter and left pons.  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 Chronic combined systolic and diastolic congestive heart failure, NYHA class 2 ([place]) 05/11/2015 - 05/15/15   a. EF 20-25% with anterior-anteroseptal akinesis (immediately post anterior STEMI).  ; b. 10/10/'16 Echo: EF 35-40% with moderate mid-apical anteroseptal, anterior and apical hypokinesis.  . Coronary artery disease involving native heart 05/11/2015   Cath: 3V CAD with 90% prox RCA, 80% mid RCA, 80% OM3, (RCA and OM residual treated medically) 100% LAD - PCI to Prox-Mid LAD with Overlapping Promus Premier DES 3.0 x 38 & 3.0 x 16 (post-dilated to 3.5 mm)   . Essential hypertension   . H/O: GI bleed 05/15/2015   s/p Sigmoid Polypectomy 05/04/2015 - prior to STEMI on May 11, 2015; Had GI Bleed following TPA for CVA, while on ASA & Brilinta; Flex Sig - 1. Internal Hemorrhoids, 2. Distal sigmoid polypectomy site with flat Whitebase status post biopsy and tattoo with 72mL spot over 3 injections 3. Proximal sigmoid polypectomy site seen as well, 4) otherwise normal with no evidence of bleed  . Hyperlipidemia with target LDL less than 70   . Ischemic cardiomyopathy 05/11/2015  EF 40% on cath 05/11/2015 after anterior STEMI, EF 20-25% on echo 05/13/2015; 12/2015 =  EF ~45%.  . Prediabetes Oct 2016   A1C 6.0 in Oct 2016  . ST elevation myocardial infarction (STEMI) involving left anterior descending (LAD) coronary artery with complication ([place]) XX123456   100% Prox LAD - PCI with overlapping DES x 2  . Tobacco abuse    a. 30 yrs - 1.5 ppd. - Quit 05/11/2015  . Vision abnormalities    Right hemianopsia    Current Outpatient Medications on File Prior to Visit  Medication Sig Dispense Refill  . carvedilol (COREG) 12.5 MG tablet Take 1 tablet  (12.5 mg total) by mouth 2 (two) times daily. **Needs OV** 60 tablet 1  . clopidogrel (PLAVIX) 75 MG tablet Take 1 tablet (75 mg total) by mouth daily. 30 tablet 1  . ferrous sulfate 325 (65 FE) MG tablet Take 325 mg by mouth daily with breakfast.    . furosemide (LASIX) 20 MG tablet TAKE 1 TABLET BY MOUTH ONCE DAILY 30 tablet 11  . losartan (COZAAR) 25 MG tablet Take 1 tablet (25 mg total) by mouth daily. **Needs OV** 60 tablet 1  . pantoprazole (PROTONIX) 40 MG tablet Take 40 mg by mouth daily.     . rosuvastatin (CRESTOR) 40 MG tablet Take 1 tablet (40 mg total) by mouth daily at 6 PM. 90 tablet 3  . nitroGLYCERIN (NITROSTAT) 0.4 MG SL tablet Place 1 tablet (0.4 mg total) under the tongue every 5 (five) minutes x 3 doses as needed for chest pain. (Patient not taking: Reported on 11/23/2019) 25 tablet 3   No current facility-administered medications on file prior to visit.    Allergies  Allergen Reactions  . Latex Itching, Rash and Other (See Comments)    Blisters    Blood pressure 96/60, pulse (!) 58, temperature 98.1 F (36.7 C), height 5\' 7"  (1.702 m), weight 198 lb 9.6 oz (90.1 kg), SpO2 97 %.  Essential hypertension Blood pressure remains low and asymptomatic. Patient reports average home readings in the [id number] systolic but never check his BP in the mornings. His home BP cuff was determined to be accurate as well.   Will continue all medication as previously prescribed, okay to hold losartan 100mg  is BP < 126mmHg, and follow up in 4-5 weeks. Patient was instructed to check his blood pressure at 7am and 7pm and bring records to follow up appointment for potential medication adjustment (if needed).   Cambell Stanek Rodriguez-Guzman PharmD, BCPS, [place] [address] 11/23/2019 2:07 PM

## 2019-11-23 NOTE — Addendum Note (Signed)
Addended by: Newt Minion on: 11/23/2019 02:05 PM   Modules accepted: Orders

## 2019-11-23 NOTE — Telephone Encounter (Signed)
Updated order as previous order was not able to be released.

## 2019-11-23 NOTE — Assessment & Plan Note (Signed)
Blood pressure remains low and asymptomatic. Patient reports average home readings in the [id number] systolic but never check his BP in the mornings. His home BP cuff was determined to be accurate as well.   Will continue all medication as previously prescribed, okay to hold losartan 100mg  is BP < 120mmHg, and follow up in 4-5 weeks. Patient was instructed to check his blood pressure at 7am and 7pm and bring records to follow up appointment for potential medication adjustment (if needed).

## 2019-11-23 NOTE — Patient Instructions (Signed)
Return for a follow up appointment in 4-5 weeks (Dr Areatha Keas)  Check your blood pressure at home daily (if able) and keep record of the readings.  Take your BP meds as follows: *NO MEDICATION CHANGES*  Bring all of your meds, your BP cuff and your record of home blood pressures to your next appointment.  Exercise as you're able, try to walk approximately 30 minutes per day.  Keep salt intake to a minimum, especially watch canned and prepared boxed foods.  Eat more fresh fruits and vegetables and fewer canned items.  Avoid eating in fast food restaurants.    HOW TO TAKE YOUR BLOOD PRESSURE: . Rest 5 minutes before taking your blood pressure. .  Don't smoke or drink caffeinated beverages for at least 30 minutes before. . Take your blood pressure before (not after) you eat. . Sit comfortably with your back supported and both feet on the floor (don't cross your legs). . Elevate your arm to heart level on a table or a desk. . Use the proper sized cuff. It should fit smoothly and snugly around your bare upper arm. There should be enough room to slip a fingertip under the cuff. The bottom edge of the cuff should be 1 inch above the crease of the elbow. . Ideally, take 3 measurements at one sitting and record the average.

## 2019-11-24 DIAGNOSIS — Z01818 Encounter for other preprocedural examination: Secondary | ICD-10-CM | POA: Diagnosis not present

## 2019-11-25 LAB — BASIC METABOLIC PANEL
BUN/Creatinine Ratio: 20 (ref 10–24)
BUN: 24 mg/dL (ref 8–27)
CO2: 23 mmol/L (ref 20–29)
Calcium: 10 mg/dL (ref 8.6–10.2)
Chloride: 105 mmol/L (ref 96–106)
Creatinine, Ser: 1.19 mg/dL (ref 0.76–1.27)
GFR calc Af Amer: 69 mL/min/{1.73_m2} (ref >59)
GFR calc non Af Amer: 59 mL/min/{1.73_m2} — ABNORMAL LOW (ref >59)
Glucose: 82 mg/dL (ref 65–99)
Potassium: 5.2 mmol/L (ref 3.5–5.2)
Sodium: 146 mmol/L — ABNORMAL HIGH (ref 134–144)

## 2019-12-02 ENCOUNTER — Ambulatory Visit (HOSPITAL_COMMUNITY)
Admission: RE | Admit: 2019-12-02 | Discharge: 2019-12-02 | Disposition: A | Discharge status: Home / Self Care | Payer: Medicare Other | Source: Ambulatory Visit | Attending: Cardiology | Admitting: Cardiology

## 2019-12-02 ENCOUNTER — Other Ambulatory Visit: Payer: Self-pay

## 2019-12-02 DIAGNOSIS — I25119 Atherosclerotic heart disease of native coronary artery with unspecified angina pectoris: Secondary | ICD-10-CM | POA: Diagnosis not present

## 2019-12-02 DIAGNOSIS — I700 Atherosclerosis of aorta: Secondary | ICD-10-CM | POA: Diagnosis not present

## 2019-12-02 MED ORDER — IOHEXOL 350 MG/ML SOLN
100.0000 mL | Freq: Once | INTRAVENOUS | Status: AC | PRN
  Administered 2019-12-02: 100 mL via INTRAVENOUS

## 2019-12-07 ENCOUNTER — Other Ambulatory Visit: Payer: Self-pay | Admitting: Cardiology

## 2019-12-07 ENCOUNTER — Ambulatory Visit: Payer: Medicare Other | Admitting: Cardiology

## 2019-12-07 DIAGNOSIS — Z0000 Encounter for general adult medical examination without abnormal findings: Secondary | ICD-10-CM | POA: Diagnosis not present

## 2019-12-07 DIAGNOSIS — I10 Essential (primary) hypertension: Secondary | ICD-10-CM | POA: Diagnosis not present

## 2019-12-07 DIAGNOSIS — E785 Hyperlipidemia, unspecified: Secondary | ICD-10-CM | POA: Diagnosis not present

## 2019-12-07 DIAGNOSIS — I779 Disorder of arteries and arterioles, unspecified: Secondary | ICD-10-CM | POA: Diagnosis not present

## 2019-12-08 ENCOUNTER — Other Ambulatory Visit: Payer: Self-pay

## 2019-12-08 MED ORDER — CARVEDILOL 12.5 MG PO TABS: 12.5000 mg | ORAL_TABLET | Freq: Two times a day (BID) | ORAL | 6 refills | Status: DC

## 2019-12-23 ENCOUNTER — Other Ambulatory Visit: Payer: Self-pay

## 2019-12-23 ENCOUNTER — Encounter: Payer: Self-pay | Admitting: Cardiology

## 2019-12-23 ENCOUNTER — Ambulatory Visit: Payer: Medicare Other | Admitting: Cardiology

## 2019-12-23 VITALS — BP 110/54 | HR 67 | Ht 67.0 in | Wt 196.4 lb

## 2019-12-23 DIAGNOSIS — I25119 Atherosclerotic heart disease of native coronary artery with unspecified angina pectoris: Secondary | ICD-10-CM | POA: Diagnosis not present

## 2019-12-23 DIAGNOSIS — Z955 Presence of coronary angioplasty implant and graft: Secondary | ICD-10-CM | POA: Diagnosis not present

## 2019-12-23 DIAGNOSIS — I10 Essential (primary) hypertension: Secondary | ICD-10-CM | POA: Diagnosis not present

## 2019-12-23 DIAGNOSIS — R935 Abnormal findings on diagnostic imaging of other abdominal regions, including retroperitoneum: Secondary | ICD-10-CM

## 2019-12-23 DIAGNOSIS — E785 Hyperlipidemia, unspecified: Secondary | ICD-10-CM | POA: Diagnosis not present

## 2019-12-23 DIAGNOSIS — I739 Peripheral vascular disease, unspecified: Secondary | ICD-10-CM

## 2019-12-23 NOTE — Progress Notes (Signed)
Primary Care Provider: [name], MD Cardiologist: Glenetta Hew, MD Electrophysiologist: None  Clinic Note: Chief Complaint  Patient presents with  . Follow-up    Test results    HPI:    Damon Walker is a 74 y.o. male with a PMH notable for CAD having PCI with ischemic cardiomyopathy (MI complicated by stroke shortly after PCI-not thought to be related to PCI) as well as hypertension hyperlipidemia and stage III CKD who presents today for 33-month follow-up to discuss test results.  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  Damon Walker was last seen in March 2021 still noting some mild exertional dyspnea with overexertion, but still limited by hip pain.  No chest pain or pressure or CHF symptoms.  Had some low blood pressures at his PCPs office.  Weaned off Imdur  Abdominal vascular ultrasound ordered to exclude aortoiliac disease--as a result of this, he had with up having a CT angiogram of the abdomen and pelvis.  Recent Hospitalizations:   None  Reviewed  CV studies:    The following studies were reviewed today: (if available, images/films reviewed: From Epic Chart or Care Everywhere) . December 02, 2019-CT Angio Abdomen-Pelvis: (To evaluate 5.4 x 5.6 cm mass noted on ultrasound).  Mass appears to be well-circumscribed cyst in the mid to lower left kidney with no evidence of hydronephrosis.  Abdominal aortic atherosclerosis noted without aneurysm or dissection.  No significant splanchnic vessel disease.  Bilateral paired renal arteries noted with moderate-severe focal proximal stenosis of the larger of the 2 left renal arteries.  Possible early liver cirrhotic disease.  Cholelithiasis without biliary obstruction.   Interval History:    Damon Walker returns here today as usual with his wife in accompaniment.  He is doing very well.  Still limited by hip pain (but doing better with an injection).  Walking is still limited but better.  He was happy to see that his pain is not caused by vascular disease.  Blood pressure has been more stable with no drops.  Rarely taking additional Lasix now.  Seems to be on stable medications without notable issues.  CV Review of Symptoms (Summary): positive for - Rare spells with drops in blood pressure.  Not symptomatic. negative for - chest pain, dyspnea on exertion, edema, irregular heartbeat, orthopnea, palpitations, paroxysmal nocturnal dyspnea, rapid heart rate, shortness of breath or Syncope/near syncope, TIA/amaurosis fugax, claudication.  The patient DOES NOT have symptoms concerning for COVID-19 infection (fever, chills, cough, or new shortness of breath).  The patient is practicing social distancing & Masking.     REVIEWED OF SYSTEMS   Review of Systems  Constitutional: Negative for malaise/fatigue (Not very active, but does not notice fatigue) and weight loss.  HENT: Negative for congestion and nosebleeds.   Respiratory: Negative for shortness of breath.   Gastrointestinal: Negative for abdominal pain, blood in stool, heartburn and melena.  Genitourinary: Negative for hematuria.  Musculoskeletal: Positive for back pain and joint pain (Mostly right hip). Negative for falls.  Neurological: Negative for dizziness, focal weakness and headaches.  Psychiatric/Behavioral: Negative for memory loss. The patient is not nervous/anxious.    I have reviewed and (if needed) personally updated the patient's problem list, medications, allergies, past medical and surgical history, social and family history.   PAST MEDICAL HISTORY  Past Medical History:  Diagnosis Date  . Carotid arterial disease ([place]) 06/2014; 01/09/2015   Followed by Dr. Donnetta Hutching of [facility] -- a) Carotid u/s: 40-59% bilat  ICA stenosis; b) 10/2019: Carotid Dopplers March 17, [id number]: R ICA 123456, LICA 123456.  Right vertebral artery appears occluded.  Normal subclavian flow.  Normal left vertebral artery. -->  No change since 2020  . 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 ([place]) 05/14/2015   -- Given TPA.  MRI Brain: Acute L Occipital Lobe Infarct. Chronic microvascular ischemic changes in the white matter and left pons.  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 Cholelithiasis with obstruction 11/2019   Cholelithiasis noted without biliary obstruction or inflammation.  . Chronic combined systolic and diastolic congestive heart failure, NYHA class 2 ([place]) 05/11/2015 - 05/15/15   a. EF 20-25% with anterior-anteroseptal akinesis (immediately post anterior STEMI).  ; b. 10/10/'16 Echo: EF 35-40% with moderate mid-apical anteroseptal, anterior and apical hypokinesis.  . Coronary artery disease involving native heart 05/11/2015   Cath: 3V CAD with 90% prox RCA, 80% mid RCA, 80% OM3, (RCA and OM residual treated medically) 100% LAD - PCI to Prox-Mid LAD with Overlapping Promus Premier DES 3.0 x 38 & 3.0 x 16 (post-dilated to 3.5 mm)   . Essential hypertension   . H/O: GI bleed 05/15/2015   s/p Sigmoid Polypectomy 05/04/2015 - prior to STEMI on May 11, 2015; Had GI Bleed following TPA for CVA, while on ASA & Brilinta; Flex Sig - 1. Internal Hemorrhoids, 2. Distal sigmoid polypectomy site with flat Whitebase status post biopsy and tattoo with 37mL spot over 3 injections 3. Proximal sigmoid polypectomy site seen as well, 4) otherwise normal with no evidence of bleed  . Hyperlipidemia with target LDL less than 70   . Ischemic cardiomyopathy 05/11/2015   EF 40% on cath 05/11/2015 after anterior STEMI, EF 20-25% on echo 05/13/2015; 12/2015 =  EF ~45%.  . Liver disease, chronic, with cirrhosis ([place]) 11/2019   Mild possible early cirrhotic liver disease noted on CT  scan.  . Prediabetes Oct 2016   A1C 6.0 in Oct 2016  . ST elevation myocardial infarction (STEMI) involving left anterior descending (LAD) coronary artery with complication ([place]) XX123456   100% Prox LAD - PCI with overlapping DES x 2  . Tobacco abuse    a. 30 yrs - 1.5 ppd. - Quit 05/11/2015  . Vision abnormalities    Right hemianopsia     PAST SURGICAL HISTORY   Past Surgical History:  Procedure Laterality Date  . CARDIAC CATHETERIZATION N/A 05/11/2015   Procedure: Left Heart Cath and Coronary Angiography;  Surgeon: Peter M Martinique, MD;  Location: [facility];  Service: Cardiovascular;  100% pLAD (long lesion). RCA - prox 90%, mid 80%. OM2 & OM3 80% (OM2 & 3 not necessarily PCI targets); EF 35-45% with Anterio HK  . CARDIAC CATHETERIZATION N/A 05/11/2015   Procedure: Coronary Stent Intervention;  Surgeon: Peter M Martinique, MD;  Location: [facility];  Service: Cardiovascular; PCI to Prox-Mid LAD with Overlapping Promus Premier DES 3.0 x 38 & 3.0 x 16 (post-dilated to 3.5 mm)    . FLEXIBLE SIGMOIDOSCOPY N/A 05/18/2015   Procedure: FLEXIBLE SIGMOIDOSCOPY;  Surgeon: Clarene Essex, MD;  [facility];  Service: Endoscopy;  Laterality: N/A;  . NM MYOVIEW LTD  08/2016   EF 30% with large anterior-anteroseptal and apical infarct consistent with LAD infarct. His HIGH  RISK because of large infarct. No ischemia.  . S/P Appendectomy     Age 10  . S/P Inguinal Hernia Repair     In his 20's.  . TRANSTHORACIC ECHOCARDIOGRAM  05/15/2015   Mild LVH, EF 35-40% - Mod HK of mid-apical anteroseptal, anterior & apical walls.  Gr 1 DD. Mild bilateral Atrial Enlargement.  . TRANSTHORACIC ECHOCARDIOGRAM  12/2015   EF up to ~40-45% wiht Anterior-Anteroapical HK.  Mod LV dilation with increased LVEDP.  Marland Kitchen TRANSTHORACIC ECHOCARDIOGRAM  12/'17; 4/'18   a) EF ~30-35% (in setting of HTN Urgency). - Gr 2 DD;; b) EF 30-35% (per Dr. Sallyanne Kuster - 35-40%). Anterior-anteroapical Akinesis. Gr2 DD w/ high  filling pressures. Mild LA dilation. Mild RV dilation.    MEDICATIONS/ALLERGIES   Current Meds  Medication Sig  . carvedilol (COREG) 12.5 MG tablet Take 1 tablet (12.5 mg total) by mouth 2 (two) times daily. **Needs OV**  . clopidogrel (PLAVIX) 75 MG tablet Take 1 tablet (75 mg total) by mouth daily.  . ferrous sulfate 325 (65 FE) MG tablet Take 325 mg by mouth daily with breakfast.  . furosemide (LASIX) 20 MG tablet TAKE 1 TABLET BY MOUTH ONCE DAILY  . losartan (COZAAR) 25 MG tablet Take 1 tablet (25 mg total) by mouth daily. **Needs OV**  . nitroGLYCERIN (NITROSTAT) 0.4 MG SL tablet Place 1 tablet (0.4 mg total) under the tongue every 5 (five) minutes x 3 doses as needed for chest pain.  . pantoprazole (PROTONIX) 40 MG tablet Take 40 mg by mouth daily.   . rosuvastatin (CRESTOR) 40 MG tablet Take 1 tablet (40 mg total) by mouth daily at 6 PM.    Allergies  Allergen Reactions  . Latex Itching, Rash and Other (See Comments)    Blisters    SOCIAL HISTORY/FAMILY HISTORY   Reviewed in Epic:  Pertinent findings: N/A.  OBJCTIVE -PE, EKG, labs   Wt Readings from Last 3 Encounters:  12/23/19 196 lb 6.4 oz (89.1 kg)  11/23/19 198 lb 9.6 oz (90.1 kg)  10/21/19 199 lb 3.2 oz (90.4 kg)    Physical Exam: BP (!) 110/54   Pulse 67   Ht 5\' 7"  (1.702 m)   Wt 196 lb 6.4 oz (89.1 kg)   SpO2 93%   BMI 30.76 kg/m  Physical Exam  Constitutional: He is oriented to person, place, and time. He appears well-developed and well-nourished. No distress.  Healthy-appearing.  Well-groomed.  HENT:  Head: Normocephalic and atraumatic.  Neck: No hepatojugular reflux and no JVD present. Carotid bruit is present (Right).  Cardiovascular: Normal rate, regular rhythm, normal heart sounds and intact distal pulses.  No extrasystoles are present. PMI is not displaced. Exam reveals no gallop and no friction rub.  No murmur heard. Abdominal:  No obvious HSM.  Musculoskeletal:        General: No edema.  Normal range of motion.     Cervical back: Normal range of motion and neck supple.  Neurological: He is alert and oriented to person, place, and time.  Psychiatric: He has a normal mood and affect. His behavior is normal. Judgment and thought content normal.  Vitals reviewed.    Adult ECG Report n/a  Recent Labs: From Dec 07, 2019-TC 123, TG 195, HDL 35.  (LDL not recorded was 60 in April 2020).  A1c 6.3, CR 1.19, K+ 5.2.  Lab Results  Component Value Date   CREATININE 1.19 11/24/2019   BUN 24 11/24/2019   NA 146 (H) 11/24/2019  K 5.2 11/24/2019   CL 105 11/24/2019   CO2 23 11/24/2019   Lab Results  Component Value Date   TSH 1.006 05/11/2015    ASSESSMENT/PLAN    Problem List Items Addressed This Visit    Presence of drug coated stent in LAD coronary artery (Chronic)    On lifelong Plavix.  No longer on aspirin.   Okay to hold Plavix 5-7 days preop for surgeries or procedures.      Essential hypertension (Chronic)    Recent evaluation with [facility] pharmacist Racquel Rodriguez-Guzman, RP [facility], blood pressures were stable.  No change in current dose of carvedilol and losartan.  Tolerating well.      Hyperlipidemia with target LDL less than 70 (Chronic)    Labs just checked showed LDL of 54.  Excellent control on current dose of rosuvastatin.  Since he has had a history of CAD and stroke I would like to maintain aggressive control.  As long as he is tolerating medications.  No change.  I anticipate that his LDL is not changed based on the fact that other labs are not changed.      Coronary artery disease involving native coronary artery of native heart with angina pectoris ([place]) - Primary (Chronic)    Large area of LAD treated during his anterior STEMI.  For now we are medically treating his OM disease.  No further angina.  Plan: Doing well off of Imdur  Continue maintenance dose of clopidogrel for CAD and stroke.  No longer on aspirin  Okay to hold Plavix 5 to 7  days preop for any surgeries or procedures  On moderate dose of carvedilol, low-dose losartan along with high-dose rosuvastatin.  No change      Claudication (HCC)    He does not walk a lot, but it sounds like the buttock symptoms that he is having is probably more musculoskeletal and not claudication based on minimal abdominal aortic, iliac disease noted on CTA.      Abnormal abdominal CT scan    We discussed the results of his CT scan that were not cardiac in nature.  He has a renal cyst that seems to be benign, but there is also signs of possible early cirrhosis.  Will forward results to PCP.  Would suspect since he does not drink, that this is probably related to NASH.         COVID-19 Education: The signs and symptoms of COVID-19 were discussed with the patient and how to seek care for testing (follow up with PCP or arrange E-visit).   The importance of social distancing was discussed today.  I spent a total of 22 minutes with the patient. >  50% of the time was spent in direct patient consultation.  Additional time spent with chart review  / charting (studies, outside notes, etc): 6 Total Time: 28 min   Current medicines are reviewed at length with the patient today.  (+/- concerns) wanted to confirm dosing of blood pressure meds.   Patient Instructions / Medication Changes & Studies & Tests Ordered   Patient Instructions  Medication Instructions:  NO CHANGES *If you need a refill on your cardiac medications before your next appointment, please call your pharmacy*   Lab Work: NOT NEEDED  Testing/Procedures: NOT NEEDED   Follow-Up: At [facility], you and your health needs are our priority.  As part of our continuing mission to provide you with exceptional heart care, we have created designated Provider Care Teams.  These  Care Teams include your primary Cardiologist (physician) and Advanced Practice Providers (APPs -  Physician Assistants and Nurse Practitioners)  who all work together to provide you with the care you need, when you need it.    Your next appointment:   7 month(s)  The format for your next appointment:   In Person  Provider:   Glenetta Hew, MD       Studies Ordered:   No orders of the defined types were placed in this encounter.    Glenetta Hew, M.D., M.S. Interventional Cardiologist   Pager # [phone] Phone # [phone] [address] [place],  [address]   Thank you for choosing Heartcare at [facility]!!

## 2019-12-23 NOTE — Patient Instructions (Signed)
Medication Instructions:  NO CHANGES *If you need a refill on your cardiac medications before your next appointment, please call your pharmacy*   Lab Work: NOT NEEDED  Testing/Procedures: NOT NEEDED   Follow-Up: At [facility], you and your health needs are our priority.  As part of our continuing mission to provide you with exceptional heart care, we have created designated Provider Care Teams.  These Care Teams include your primary Cardiologist (physician) and Advanced Practice Providers (APPs -  Physician Assistants and Nurse Practitioners) who all work together to provide you with the care you need, when you need it.    Your next appointment:   7 month(s)  The format for your next appointment:   In Person  Provider:   Glenetta Hew, MD

## 2019-12-26 ENCOUNTER — Encounter: Payer: Self-pay | Admitting: Cardiology

## 2019-12-26 DIAGNOSIS — R935 Abnormal findings on diagnostic imaging of other abdominal regions, including retroperitoneum: Secondary | ICD-10-CM | POA: Insufficient documentation

## 2019-12-26 NOTE — Assessment & Plan Note (Signed)
Recent evaluation with [facility] pharmacist Racquel Rodriguez-Guzman, RP [facility], blood pressures were stable.  No change in current dose of carvedilol and losartan.  Tolerating well.

## 2019-12-26 NOTE — Assessment & Plan Note (Signed)
We discussed the results of his CT scan that were not cardiac in nature.  He has a renal cyst that seems to be benign, but there is also signs of possible early cirrhosis.  Will forward results to PCP.  Would suspect since he does not drink, that this is probably related to NASH.

## 2019-12-26 NOTE — Assessment & Plan Note (Addendum)
Large area of LAD treated during his anterior STEMI.  For now we are medically treating his OM disease.  No further angina.  Plan: Doing well off of Imdur  Continue maintenance dose of clopidogrel for CAD and stroke.  No longer on aspirin  Okay to hold Plavix 5 to 7 days preop for any surgeries or procedures  On moderate dose of carvedilol, low-dose losartan along with high-dose rosuvastatin.  No change

## 2019-12-26 NOTE — Assessment & Plan Note (Signed)
Labs just checked showed LDL of 54.  Excellent control on current dose of rosuvastatin.  Since he has had a history of CAD and stroke I would like to maintain aggressive control.  As long as he is tolerating medications.  No change.  I anticipate that his LDL is not changed based on the fact that other labs are not changed.

## 2019-12-26 NOTE — Assessment & Plan Note (Signed)
He does not walk a lot, but it sounds like the buttock symptoms that he is having is probably more musculoskeletal and not claudication based on minimal abdominal aortic, iliac disease noted on CTA.

## 2019-12-26 NOTE — Assessment & Plan Note (Signed)
On lifelong Plavix.  No longer on aspirin.   Okay to hold Plavix 5-7 days preop for surgeries or procedures.

## 2020-01-04 ENCOUNTER — Other Ambulatory Visit: Payer: Self-pay | Admitting: Cardiology

## 2020-01-28 ENCOUNTER — Other Ambulatory Visit: Payer: Self-pay | Admitting: Cardiology

## 2020-03-18 DIAGNOSIS — Z20822 Contact with and (suspected) exposure to covid-19: Secondary | ICD-10-CM | POA: Diagnosis not present

## 2020-04-15 DIAGNOSIS — Z20822 Contact with and (suspected) exposure to covid-19: Secondary | ICD-10-CM | POA: Diagnosis not present

## 2020-04-15 DIAGNOSIS — Z03818 Encounter for observation for suspected exposure to other biological agents ruled out: Secondary | ICD-10-CM | POA: Diagnosis not present

## 2020-04-19 DIAGNOSIS — K219 Gastro-esophageal reflux disease without esophagitis: Secondary | ICD-10-CM | POA: Diagnosis not present

## 2020-06-25 ENCOUNTER — Other Ambulatory Visit: Payer: Self-pay | Admitting: Cardiology

## 2020-07-08 ENCOUNTER — Other Ambulatory Visit: Payer: Self-pay | Admitting: Cardiology

## 2020-07-12 DIAGNOSIS — H9190 Unspecified hearing loss, unspecified ear: Secondary | ICD-10-CM | POA: Diagnosis not present

## 2020-07-12 DIAGNOSIS — E1169 Type 2 diabetes mellitus with other specified complication: Secondary | ICD-10-CM | POA: Diagnosis not present

## 2020-07-12 DIAGNOSIS — E785 Hyperlipidemia, unspecified: Secondary | ICD-10-CM | POA: Diagnosis not present

## 2020-07-12 DIAGNOSIS — I10 Essential (primary) hypertension: Secondary | ICD-10-CM | POA: Diagnosis not present

## 2020-07-27 ENCOUNTER — Encounter: Payer: Self-pay | Admitting: Cardiology

## 2020-07-27 ENCOUNTER — Other Ambulatory Visit: Payer: Self-pay

## 2020-07-27 ENCOUNTER — Ambulatory Visit: Payer: Medicare Other | Admitting: Cardiology

## 2020-07-27 VITALS — BP 120/54 | HR 56 | Ht 67.0 in | Wt 193.0 lb

## 2020-07-27 DIAGNOSIS — E785 Hyperlipidemia, unspecified: Secondary | ICD-10-CM

## 2020-07-27 DIAGNOSIS — Z955 Presence of coronary angioplasty implant and graft: Secondary | ICD-10-CM

## 2020-07-27 DIAGNOSIS — I25119 Atherosclerotic heart disease of native coronary artery with unspecified angina pectoris: Secondary | ICD-10-CM | POA: Diagnosis not present

## 2020-07-27 DIAGNOSIS — I10 Essential (primary) hypertension: Secondary | ICD-10-CM

## 2020-07-27 DIAGNOSIS — I255 Ischemic cardiomyopathy: Secondary | ICD-10-CM

## 2020-07-27 DIAGNOSIS — I2109 ST elevation (STEMI) myocardial infarction involving other coronary artery of anterior wall: Secondary | ICD-10-CM

## 2020-07-27 DIAGNOSIS — I6523 Occlusion and stenosis of bilateral carotid arteries: Secondary | ICD-10-CM

## 2020-07-27 DIAGNOSIS — I63532 Cerebral infarction due to unspecified occlusion or stenosis of left posterior cerebral artery: Secondary | ICD-10-CM

## 2020-07-27 NOTE — Patient Instructions (Addendum)
.  Medication Instructions:  No changes   *If you need a refill on your cardiac medications before your next appointment, please call your pharmacy*   Lab Work: Not needed   If you have labs (blood work) drawn today and your tests are completely normal, you will receive your results only by: Marland Kitchen MyChart Message (if you have MyChart) OR . A paper copy in the mail If you have any lab test that is abnormal or we need to change your treatment, we will call you to review the results.   Testing/Procedures:  Not needed  Follow-Up: At [facility], you and your health needs are our priority.  As part of our continuing mission to provide you with exceptional heart care, we have created designated Provider Care Teams.  These Care Teams include your primary Cardiologist (physician) and Advanced Practice Providers (APPs -  Physician Assistants and Nurse Practitioners) who all work together to provide you with the care you need, when you need it.  Your next appointment:   10 month(s)  The format for your next appointment:   In Person  Provider:   Glenetta Hew, MD

## 2020-07-27 NOTE — Progress Notes (Signed)
Primary Care Provider: [name], MD Cardiologist: Glenetta Hew, MD Electrophysiologist: None  Clinic Note: Chief Complaint  Patient presents with  . Follow-up    FOLLOW-UP  . Coronary Artery Disease    No angina  . Cardiomyopathy    No CHF or arrhythmia   Problem List Items Addressed This Visit    ST elevation myocardial infarction (STEMI) of anterior wall, subsequent episode of care ([place]) - Primary (Chronic)   Cardiomyopathy, ischemic: EF most recently 35-40%) (Chronic)   Essential hypertension (Chronic)   Hyperlipidemia with target LDL less than 70 (Chronic)   Coronary artery disease involving native coronary artery of native heart with angina pectoris (HCC) (Chronic)      HPI:    Damon Walker is a 74 y.o. male with a cardiac issue noted below who presents today for 17-month follow-up.  CV History:  anterior STEMI--> LADPCI in October 2016-- followed by CVA  IschemicCardiomyopathy withCHRONIC COMBINED SYSTOLIC AND DIASTOLIC HEART FAILURE- Class I-II.,  Otherwise he also has hypertension, hyperlipidemia and stage III chronic kidney disease.  Damon Walker was last seen on May 2021 for routine follow-up.  Doing well.  Limited by left hip pain.  Better with injection.  Walking still very limited.  Happy to hear that no evidence of significant PAD.  Rarely takes Lasix.  Edema pretty well controlled.  Is not very active because of his hip pain, but with what he does, he denies any chest pain or pressure.  Slightly rare spells of drops in blood pressure.  Not easily predictable.  Recent Hospitalizations: None  Reviewed  CV studies:    The following studies were reviewed today: (if available, images/films reviewed: From Epic Chart or Care Everywhere) --No new studies  . Carotid Dopplers October 20, 6627: R ICA 476%, LICA intermediate 40 to 59% stenosis.  Left vertebral antegrade flow.  Right vertebral flow not visualized.  Bilateral subclavian arteries are  stable.  No sign of subclavian steal. . Abdominal aorta study 11/04/2019 -> no evidence of AAA.  Left external iliac has> 50% stenosis.  Turbulent flow and external iliac arteries with plaque seen in the left external iliac/axillary velocities.  There is delayed acceleration time in the common femoral arteries suggesting lower extremity arterial disease.  Hyperechoic structure in the left upper abdomen measuring 5.4 x 5.6 cm.   Interval History:   Damon Walker   CV Review of Symptoms (Summary) Cardiovascular ROS: no chest pain or dyspnea on exertion positive for - Baseline fatigue, somewhat sedentary.  Limited by hip pain.  Limited exertional dyspnea.  Trivial edema. negative for - irregular heartbeat, orthopnea, palpitations, paroxysmal nocturnal dyspnea, rapid heart rate, shortness of breath or Syncope/near syncope or TIA/amaurosis fugax, claudication  The patient does not have symptoms concerning for COVID-19 infection (fever, chills, cough, or new shortness of breath).   REVIEWED OF SYSTEMS   Review of Systems  Constitutional: Positive for malaise/fatigue (Somewhat sedentary.  Does not do much.  Limited by hip and back pain.). Negative for weight loss (Not really).  HENT: Positive for hearing loss (Very hard of hearing). Negative for congestion and nosebleeds.   Respiratory: Positive for shortness of breath (Mildly baseline).   Cardiovascular: Negative for leg swelling (Minimal.  Barely using Lasix maybe once a month.).  Genitourinary: Negative for dysuria, frequency and hematuria.  Musculoskeletal: Positive for back pain, joint pain (Significant hip pain) and myalgias. Negative for neck pain.  Neurological: Positive for focal weakness and weakness (Legs are very weak). Negative for  dizziness. Headaches: Not really dizzy, but does have some vertigo.  Psychiatric/Behavioral: Positive for memory loss. Negative for depression (Actually in good spirits). The patient is nervous/anxious (Can  have anxiety episodes). The patient does not have insomnia.    I have reviewed and (if needed) personally updated the patient's problem list, medications, allergies, past medical and surgical history, social and family history.   PAST MEDICAL HISTORY   Past Medical History:  Diagnosis Date  . Carotid arterial disease ([place]) 06/2014; 01/09/2015   Followed by Dr. Donnetta Hutching of [facility] -- a) Carotid u/s: 40-59% bilat ICA stenosis; b) 10/2019: Carotid Dopplers March 17, 123XX123: R ICA 123456, LICA 123456.  Right vertebral artery appears occluded.  Normal subclavian flow.  Normal left vertebral artery. -->  No change since 2020  . 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  MRI Brain: Acute L Occipital Lobe Infarct. Chronic microvascular ischemic changes in the white matter and left pons.  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 Cholelithiasis with obstruction 11/2019   Cholelithiasis noted without biliary obstruction or inflammation.  . Chronic combined systolic and diastolic congestive heart failure, NYHA class 2 ([place]) 05/11/2015 - 05/15/15   a. EF 20-25% with anterior-anteroseptal akinesis (immediately post anterior STEMI).  ; b. 10/10/'16 Echo: EF 35-40% with moderate mid-apical anteroseptal, anterior and apical hypokinesis.  . Coronary artery disease involving native heart 05/11/2015   Cath: 3V CAD with 90% prox RCA, 80% mid RCA, 80% OM3, (RCA and OM residual treated medically) 100% LAD - PCI to Prox-Mid LAD with Overlapping Promus Premier DES 3.0 x 38 & 3.0 x 16 (post-dilated to 3.5 mm)   . Essential hypertension   . H/O: GI bleed 05/15/2015   s/p Sigmoid Polypectomy 05/04/2015 - prior to STEMI on May 11, 2015; Had GI Bleed following TPA for CVA, while on ASA & Brilinta; Flex Sig - 1. Internal Hemorrhoids, 2. Distal sigmoid polypectomy site with flat [place] status post biopsy and tattoo with 34mL spot  over 3 injections 3. Proximal sigmoid polypectomy site seen as well, 4) otherwise normal with no evidence of bleed  . Hyperlipidemia with target LDL less than 70   . Ischemic cardiomyopathy 05/11/2015   EF 40% on cath 05/11/2015 after anterior STEMI, EF 20-25% on echo 05/13/2015; 12/2015 =  EF ~45%.  . Liver disease, chronic, with cirrhosis ([place]) 11/2019   Mild possible early cirrhotic liver disease noted on CT scan.  . Prediabetes Oct 2016   A1C 6.0 in Oct 2016  . ST elevation myocardial infarction (STEMI) involving left anterior descending (LAD) coronary artery with complication ([place]) [id number]   100% Prox LAD - PCI with overlapping DES x 2  . Tobacco abuse    a. 30 yrs - 1.5 ppd. - Quit 05/11/2015  . Vision abnormalities    Right hemianopsia    PAST SURGICAL HISTORY   Past Surgical History:  Procedure Laterality Date  . CARDIAC CATHETERIZATION N/A 05/11/2015   Procedure: Left Heart Cath and Coronary Angiography;  Surgeon: Peter M Martinique, MD;  Location: [place] [facility];  Service: Cardiovascular;  100% pLAD (long lesion). RCA - prox 90%, mid 80%. OM2 & OM3 80% (OM2 & 3 not necessarily PCI targets); EF 35-45% with Anterio HK  . CARDIAC CATHETERIZATION N/A 05/11/2015   Procedure: Coronary Stent Intervention;  Surgeon: Peter M Martinique, MD;  Location: [place] [facility];  Service: Cardiovascular; PCI to  Prox-Mid LAD with Overlapping Promus Premier DES 3.0 x 38 & 3.0 x 16 (post-dilated to 3.5 mm)    . FLEXIBLE SIGMOIDOSCOPY N/A 05/18/2015   Procedure: FLEXIBLE SIGMOIDOSCOPY;  Surgeon: Clarene Essex, MD;  [facility];  Service: Endoscopy;  Laterality: N/A;  . NM MYOVIEW LTD  08/2016   EF 30% with large anterior-anteroseptal and apical infarct consistent with LAD infarct. His HIGH RISK because of large infarct. No ischemia.  . S/P Appendectomy     Age 46  . S/P Inguinal Hernia Repair     In his 20's.  . TRANSTHORACIC ECHOCARDIOGRAM  05/15/2015   Mild LVH, EF 35-40% - Mod HK of  mid-apical anteroseptal, anterior & apical walls.  Gr 1 DD. Mild bilateral Atrial Enlargement.  . TRANSTHORACIC ECHOCARDIOGRAM  12/2015   EF up to ~40-45% wiht Anterior-Anteroapical HK.  Mod LV dilation with increased LVEDP.  Marland Kitchen TRANSTHORACIC ECHOCARDIOGRAM  12/'17; 4/'18   a) EF ~30-35% (in setting of HTN Urgency). - Gr 2 DD;; b) EF 30-35% (per Dr. Sallyanne Kuster - 35-40%). Anterior-anteroapical Akinesis. Gr2 DD w/ high filling pressures. Mild LA dilation. Mild RV dilation.    Immunization History  Administered Date(s) Administered  . Influenza,inj,Quad PF,6+ Mos 06/22/2015  . Moderna Sars-Covid-2 Vaccination 10/03/2019, 11/06/2019, 07/01/2020    MEDICATIONS/ALLERGIES   Current Meds  Medication Sig  . carvedilol (COREG) 12.5 MG tablet TAKE 1 TABLET BY MOUTH TWICE DAILY *NEED OFFICE VISIT*  . clopidogrel (PLAVIX) 75 MG tablet Take 1 tablet (75 mg total) by mouth daily.  . ferrous sulfate 325 (65 FE) MG tablet Take 325 mg by mouth daily with breakfast.  . furosemide (LASIX) 20 MG tablet Take 1 tablet by mouth once daily  . losartan (COZAAR) 25 MG tablet TAKE 1 TABLET BY MOUTH ONCE DAILY  . nitroGLYCERIN (NITROSTAT) 0.4 MG SL tablet Place 1 tablet (0.4 mg total) under the tongue every 5 (five) minutes x 3 doses as needed for chest pain.  . pantoprazole (PROTONIX) 20 MG tablet Take 20 mg by mouth daily.  . rosuvastatin (CRESTOR) 40 MG tablet Take 1 tablet (40 mg total) by mouth daily at 6 PM.  . [DISCONTINUED] pantoprazole (PROTONIX) 40 MG tablet Take 40 mg by mouth daily.     Allergies  Allergen Reactions  . Latex Itching, Rash and Other (See Comments)    Blisters    SOCIAL HISTORY/FAMILY HISTORY   Reviewed in Epic:  Pertinent findings:  Social History   Tobacco Use  . Smoking status: Former Smoker    Packs/day: 1.00    Years: 30.00    Pack years: 30.00    Types: Cigarettes    Quit date: 04/06/2015    Years since quitting: 5.3  . Smokeless tobacco: Never Used  . Tobacco comment:  Quit the day of his STEMI  Substance Use Topics  . Alcohol use: No    Alcohol/week: 0.0 standard drinks  . Drug use: No   Social History   Social History Narrative   Lives in [place] with wife. Originally from [place].    OBJCTIVE -PE, EKG, labs   Wt Readings from Last 3 Encounters:  07/27/20 193 lb (87.5 kg)  12/23/19 196 lb 6.4 oz (89.1 kg)  11/23/19 198 lb 9.6 oz (90.1 kg)    Physical Exam: BP (!) 120/54 (BP Location: Left Arm, Patient Position: Sitting, Cuff Size: Normal)   Pulse (!) 56   Ht 5\' 7"  (1.702 m)   Wt 193 lb (87.5 kg)   BMI 30.23 kg/m  Physical Exam Constitutional:      General: He is not in acute distress.    Appearance: Normal appearance. He is obese. He is not ill-appearing or toxic-appearing.  HENT:     Head: Normocephalic and atraumatic.     Ears:     Comments: Very hard of hearing Neck:     Vascular: Carotid bruit (Right - soft) present.  Cardiovascular:     Rate and Rhythm: Normal rate and regular rhythm.     Pulses: Normal pulses.     Heart sounds: Normal heart sounds. No murmur heard. No friction rub. No gallop.   Pulmonary:     Effort: Pulmonary effort is normal. No respiratory distress.     Breath sounds: Normal breath sounds.  Chest:     Chest wall: No tenderness.  Abdominal:     General: There is no distension.     Palpations: Abdomen is soft. There is no mass.     Comments: No HSM  Musculoskeletal:        General: No swelling (Trivial). Normal range of motion.  Neurological:     General: No focal deficit present.     Mental Status: He is alert and oriented to person, place, and time.     Cranial Nerves: No cranial nerve deficit (With exception of hearing).     Gait: Gait abnormal (Slow somewhat antalgic).     Deep Tendon Reflexes: Reflexes normal.     Comments: Very hard of hearing tends to defer answering and asking of questions to spouse  Psychiatric:        Mood and Affect: Mood normal.        Behavior: Behavior normal.         Thought Content: Thought content normal.        Judgment: Judgment normal.      Adult ECG Report Not checked  Recent Labs:    12/07/2019: TC 123, TG 185, HDL 35, LDL 57.  07/12/2020: A1c 5.9, Hgb 14.4, Cr 1.31, K+ 4.7 Lab Results  Component Value Date   CHOL 109 07/08/2016   HDL 39 (L) 07/08/2016   LDLCALC 54 07/08/2016   TRIG 78 07/08/2016   CHOLHDL 2.8 07/08/2016   Lab Results  Component Value Date   CREATININE 1.19 11/24/2019   BUN 24 11/24/2019   NA 146 (H) 11/24/2019   K 5.2 11/24/2019   CL 105 11/24/2019   CO2 23 11/24/2019   Lab Results  Component Value Date   TSH 1.006 05/11/2015    ASSESSMENT/PLAN    Problem List Items Addressed This Visit    ST elevation myocardial infarction (STEMI) of anterior wall, subsequent episode of care ([place]) (Chronic)    Sizable infarct with delayed presentation anterior MI.  Large infarct noted on echo and Myoview.  EF did improve up to 35 to 40% therefore he is not a candidate for ICD placement.  After quite a long time post MI with stroke in PE, he is feeling now stabilized out and doing relatively well.      Presence of drug coated stent in LAD coronary artery (Chronic)    On lifelong Plavix: Okay to hold 5 days preop for surgeries or procedures.      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  He is on aggressive risk modification medications for his CAD adequate for stroke prevention as well.      Cardiomyopathy, ischemic: EF most recently 35-40%) (Chronic)  He declined ICD in the past, and his EF now is improved to the point where he would not still be a candidate.  Unfortunately unable to afford Entresto, only on low-dose low losartan along with carvedilol.  Euvolemic on exam, only using very rare as needed furosemide.  Plan is for her to take both losartan at lunchtime to avoid overlap TIG by taking same time as his morning dose of carvedilol.       Essential hypertension (Chronic)    Blood pressure is well controlled on current dose of carvedilol and losartan for his cardiomyopathy. Rarely uses PRN furosemide.       Hyperlipidemia with target LDL less than 70 (Chronic)    Labs from May look great.  Should be due to be rechecked soon.  Continue current dose of Crestor.      Carotid arterial disease (HCC) (Chronic)    Followed by vascular surgery.  He has right vertebral artery occlusion. A start on statin and Plavix.      Coronary artery disease involving native coronary artery of native heart with angina pectoris (HCC) - Primary (Chronic)    Large anterior MI with cardiomyopathy seen on Myoview and echo.  Interestingly, although there is significant disease in the RCA, Myoview was nonischemic for inferior ischemia.  Plan:   Continue current dose of beta-blocker and ARB as blood pressure is well controlled.  Continue current dose of simvastatin with well-controlled lipids.  He is on maintenance dose Plavix because of extensive LAD stent and existing other disease.   Okay to hold Plavix 5 days preop for surgeries or procedures.         COVID-19 Education: The signs and symptoms of COVID-19 were discussed with the patient and how to seek care for testing (follow up with PCP or arrange E-visit).   The importance of social distancing and COVID-19 vaccination was discussed today.  The patient is practicing social distancing & Masking.   I spent a total of 50minutes with the patient spent in direct patient consultation.  Additional time spent with chart review  / charting (studies, outside notes, etc): 8 Total Time: 33 min   Current medicines are reviewed at length with the patient today.  (+/- concerns) n/a  This visit occurred during the SARS-CoV-2 public health emergency.  Safety protocols were in place, including screening questions prior to the visit, additional usage of staff PPE, and extensive cleaning of exam  room while observing appropriate contact time as indicated for disinfecting solutions.  Notice: This dictation was prepared with Dragon dictation along with smaller phrase technology. Any transcriptional errors that result from this process are unintentional and may not be corrected upon review.  Patient Instructions / Medication Changes & Studies & Tests Ordered   Patient Instructions  .Medication Instructions:  No changes   *If you need a refill on your cardiac medications before your next appointment, please call your pharmacy*   Lab Work: Not needed   If you have labs (blood work) drawn today and your tests are completely normal, you will receive your results only by: Marland Kitchen MyChart Message (if you have MyChart) OR . A paper copy in the mail If you have any lab test that is abnormal or we need to change your treatment, we will call you to review the results.   Testing/Procedures:  Not needed  Follow-Up: At [facility], you and your health needs are our priority.  As part of our continuing mission to provide you with exceptional heart  care, we have created designated Provider Care Teams.  These Care Teams include your primary Cardiologist (physician) and Advanced Practice Providers (APPs -  Physician Assistants and Nurse Practitioners) who all work together to provide you with the care you need, when you need it.  Your next appointment:   10 month(s)  The format for your next appointment:   In Person  Provider:   Glenetta Hew, MD  Studies Ordered:   No orders of the defined types were placed in this encounter.    Glenetta Hew, M.D., M.S. Interventional Cardiologist   Pager # [phone] Phone # [phone] [address] [place], [place] [address]   Thank you for choosing Heartcare at [facility]!!

## 2020-08-06 ENCOUNTER — Encounter: Payer: Self-pay | Admitting: Cardiology

## 2020-08-06 NOTE — Assessment & Plan Note (Signed)
Large anterior MI with cardiomyopathy seen on Myoview and echo.  Interestingly, although there is significant disease in the RCA, Myoview was nonischemic for inferior ischemia.  Plan:   Continue current dose of beta-blocker and ARB as blood pressure is well controlled.  Continue current dose of simvastatin with well-controlled lipids.  He is on maintenance dose Plavix because of extensive LAD stent and existing other disease.   Okay to hold Plavix 5 days preop for surgeries or procedures.

## 2020-08-06 NOTE — Assessment & Plan Note (Addendum)
On lifelong Plavix: Okay to hold 5 days preop for surgeries or procedures.

## 2020-08-06 NOTE — Assessment & Plan Note (Signed)
Labs from May look great.  Should be due to be rechecked soon.  Continue current dose of Crestor.

## 2020-08-06 NOTE — Assessment & Plan Note (Signed)
Blood pressure is well controlled on current dose of carvedilol and losartan for his cardiomyopathy. Rarely uses PRN furosemide.

## 2020-08-06 NOTE — Assessment & Plan Note (Signed)
He declined ICD in the past, and his EF now is improved to the point where he would not still be a candidate.  Unfortunately unable to afford Entresto, only on low-dose low losartan along with carvedilol.  Euvolemic on exam, only using very rare as needed furosemide.  Plan is for her to take both losartan at lunchtime to avoid overlap TIG by taking same time as his morning dose of carvedilol.

## 2020-08-06 NOTE — Assessment & Plan Note (Signed)
No residual symptoms.  He is on aggressive risk modification medications for his CAD adequate for stroke prevention as well.

## 2020-08-06 NOTE — Assessment & Plan Note (Signed)
Followed by vascular surgery.  He has right vertebral artery occlusion. A start on statin and Plavix.

## 2020-08-06 NOTE — Assessment & Plan Note (Addendum)
Sizable infarct with delayed presentation anterior MI.  Large infarct noted on echo and Myoview.  EF did improve up to 35 to 40% therefore he is not a candidate for ICD placement.  After quite a long time post MI with stroke in PE, he is feeling now stabilized out and doing relatively well.

## 2020-09-14 DIAGNOSIS — B029 Zoster without complications: Secondary | ICD-10-CM | POA: Diagnosis not present

## 2020-09-14 DIAGNOSIS — I10 Essential (primary) hypertension: Secondary | ICD-10-CM | POA: Diagnosis not present

## 2020-09-14 DIAGNOSIS — N183 Chronic kidney disease, stage 3 unspecified: Secondary | ICD-10-CM | POA: Diagnosis not present

## 2020-10-19 ENCOUNTER — Other Ambulatory Visit: Payer: Self-pay

## 2020-10-19 ENCOUNTER — Ambulatory Visit: Payer: Medicare Other | Admitting: Physician Assistant

## 2020-10-19 ENCOUNTER — Ambulatory Visit (HOSPITAL_COMMUNITY)
Admission: RE | Admit: 2020-10-19 | Discharge: 2020-10-19 | Disposition: A | Discharge status: Home / Self Care | Payer: Medicare Other | Source: Ambulatory Visit | Attending: Vascular Surgery | Admitting: Vascular Surgery

## 2020-10-19 VITALS — BP 116/69 | HR 63 | Temp 98.0°F | Resp 20 | Wt 193.0 lb

## 2020-10-19 DIAGNOSIS — I6523 Occlusion and stenosis of bilateral carotid arteries: Secondary | ICD-10-CM

## 2020-10-19 NOTE — Progress Notes (Signed)
HISTORY AND PHYSICAL     CC:  follow up. Requesting Provider:  [name], MD  HPI: This is a 75 y.o. male here for follow up for carotid artery stenosis.   He was seen in November 2018 and at that time, he had who suffered an [place] underwent PCI and stenting 2.  Later after being discharged from the hospital, he reported sudden right visual loss. He then returned to the hospital and workup revealed a left occipital lobe infarct. He had no other neurologic logical symptoms at that time.He had a carotid duplex at that time that revealed 50-69% stenosis bilaterally. His right vertebral artery was not visualized and the left was patent with antegrade flow.   He quit smoking after his MI.  Pt was last seen October 20, 2019 and at that time he remained asymptomatic.   His duplex revealed right ICA stenosis of 1-39% and left ICA stenosis of 40-59%.   In April 2021, pt had aortoiliac duplex that was ordered by Dr. Ellyn Hack.  It was felt they did not get a great view and CTA abdomen and pelvis for better evaluation.  He did not have an aneurysm or AAA.  His bilateral femoral arteries were normally patent.  She was found to have a well circumscribed 6cm simple cyst on the left kidney, which was most likely what was visualized on CT scan.  He had paired bilateral renal arteries with moderate and potential significant focal proximal stenosis of the larger of the two left renal arteries.   He is followed by Dr. Ellyn Hack for hx of MI, CAD, HTN, & cardiomyopathy.  Pt returns today for follow up.    Pt denies any amaurosis fugax, speech difficulties, weakness, numbness, paralysis or clumsiness or facial droop.  He states he does have some vision loss in the left eye from previous stroke.  He denies any claudication, wounds or rest pain. His walking is limited by his hip.    The pt is on a statin for cholesterol management.  The pt is not on a daily aspirin.   Other AC:  Plavix (lifelong per  Cards) The pt is on BB, ARB for hypertension.   The pt is not diabetic.   Tobacco hx:  former  Pt does not know his family hx as he is adopted.  Past Medical History:  Diagnosis Date  . Carotid arterial disease ([place]) 06/2014; 01/09/2015   Followed by Dr. Donnetta Hutching of [facility] -- a) Carotid u/s: 40-59% bilat ICA stenosis; b) 10/2019: Carotid Dopplers October 19, 6268: R ICA 3-50%, LICA 09-38%.  Right vertebral artery appears occluded.  Normal subclavian flow.  Normal left vertebral artery. -->  No change since 2020  . 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  MRI Brain: Acute L Occipital Lobe Infarct. Chronic microvascular ischemic changes in the white matter and left pons.  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 Cholelithiasis with obstruction 11/2019   Cholelithiasis noted without biliary obstruction or inflammation.  . Chronic combined systolic and diastolic congestive heart failure, NYHA class 2 ([place]) 05/11/2015 - 05/15/15   a. EF 20-25% with anterior-anteroseptal akinesis (immediately post anterior STEMI).  ; b. 10/10/'16 Echo: EF 35-40% with moderate mid-apical anteroseptal, anterior and apical hypokinesis.  . Coronary artery disease involving native heart 05/11/2015   Cath: 3V CAD with 90% prox RCA, 80% mid RCA, 80% OM3, (RCA and OM residual treated  medically) 100% LAD - PCI to Prox-Mid LAD with Overlapping Promus Premier DES 3.0 x 38 & 3.0 x 16 (post-dilated to 3.5 mm)   . Essential hypertension   . H/O: GI bleed 05/15/2015   s/p Sigmoid Polypectomy 05/04/2015 - prior to STEMI on May 11, 2015; Had GI Bleed following TPA for CVA, while on ASA & Brilinta; Flex Sig - 1. Internal Hemorrhoids, 2. Distal sigmoid polypectomy site with flat [place] status post biopsy and tattoo with 88mL spot over 3 injections 3. Proximal sigmoid polypectomy site seen as well, 4) otherwise normal with no evidence of  bleed  . Hyperlipidemia with target LDL less than 70   . Ischemic cardiomyopathy 05/11/2015   EF 40% on cath 05/11/2015 after anterior STEMI, EF 20-25% on echo 05/13/2015; 12/2015 =  EF ~45%.  . Liver disease, chronic, with cirrhosis ([place]) 11/2019   Mild possible early cirrhotic liver disease noted on CT scan.  . Prediabetes Oct 2016   A1C 6.0 in Oct 2016  . ST elevation myocardial infarction (STEMI) involving left anterior descending (LAD) coronary artery with complication ([place]) 57/03/4695   100% Prox LAD - PCI with overlapping DES x 2  . Tobacco abuse    a. 30 yrs - 1.5 ppd. - Quit 05/11/2015  . Vision abnormalities    Right hemianopsia    Past Surgical History:  Procedure Laterality Date  . CARDIAC CATHETERIZATION N/A 05/11/2015   Procedure: Left Heart Cath and Coronary Angiography;  Surgeon: Peter M Martinique, MD;  Location: [facility];  Service: Cardiovascular;  100% pLAD (long lesion). RCA - prox 90%, mid 80%. OM2 & OM3 80% (OM2 & 3 not necessarily PCI targets); EF 35-45% with Anterio HK  . CARDIAC CATHETERIZATION N/A 05/11/2015   Procedure: Coronary Stent Intervention;  Surgeon: Peter M Martinique, MD;  Location: [facility];  Service: Cardiovascular; PCI to Prox-Mid LAD with Overlapping Promus Premier DES 3.0 x 38 & 3.0 x 16 (post-dilated to 3.5 mm)    . FLEXIBLE SIGMOIDOSCOPY N/A 05/18/2015   Procedure: FLEXIBLE SIGMOIDOSCOPY;  Surgeon: Clarene Essex, MD;  Location: [facility];  Service: Endoscopy;  Laterality: N/A;  . NM MYOVIEW LTD  08/2016   EF 30% with large anterior-anteroseptal and apical infarct consistent with LAD infarct. His HIGH RISK because of large infarct. No ischemia.  . S/P Appendectomy     Age 18  . S/P Inguinal Hernia Repair     In his 20's.  . TRANSTHORACIC ECHOCARDIOGRAM  05/15/2015   Mild LVH, EF 35-40% - Mod HK of mid-apical anteroseptal, anterior & apical walls.  Gr 1 DD. Mild bilateral Atrial Enlargement.  . TRANSTHORACIC ECHOCARDIOGRAM  12/2015   EF  up to ~40-45% wiht Anterior-Anteroapical HK.  Mod LV dilation with increased LVEDP.  Marland Kitchen TRANSTHORACIC ECHOCARDIOGRAM  12/'17; 4/'18   a) EF ~30-35% (in setting of HTN Urgency). - Gr 2 DD;; b) EF 30-35% (per Dr. Sallyanne Kuster - 35-40%). Anterior-anteroapical Akinesis. Gr2 DD w/ high filling pressures. Mild LA dilation. Mild RV dilation.    Allergies  Allergen Reactions  . Latex Itching, Rash and Other (See Comments)    Blisters    Current medications:  Reviewed-please see list in chart.  Family History  Adopted: Yes  Problem Relation Age of Onset  . Other Mother        eczema   . Cancer Mother   . Other Other        Adopted - unaware of biological parent's histories.  Social History   Socioeconomic History  . Marital status: Married    Spouse name: Not on file  . Number of children: 3  . Years of education: Not on file  . Highest education level: Not on file  Occupational History  . Occupation: retired from [employer]  . Smoking status: Former Smoker    Packs/day: 1.00    Years: 30.00    Pack years: 30.00    Types: Cigarettes    Quit date: 04/06/2015    Years since quitting: 5.5  . Smokeless tobacco: Never Used  . Tobacco comment: Quit the day of his STEMI  Substance and Sexual Activity  . Alcohol use: No    Alcohol/week: 0.0 standard drinks  . Drug use: No  . Sexual activity: Not on file  Other Topics Concern  . Not on file  Social History Narrative   Lives in [place] with wife. Originally from [place].   Social Determinants of Health   Financial Resource Strain: Not on file  Food Insecurity: Not on file  Transportation Needs: Not on file  Physical Activity: Not on file  Stress: Not on file  Social Connections: Not on file  Intimate Partner Violence: Not on file     REVIEW OF SYSTEMS:   [X]  denotes positive finding, [ ]  denotes negative finding Cardiac  Comments:  Chest pain or chest pressure:    Shortness of breath upon exertion:     Short of breath when lying flat:    Irregular heart rhythm:        Vascular    Pain in calf, thigh, or hip brought on by ambulation:    Pain in feet at night that wakes you up from your sleep:     Blood clot in your veins:    Leg swelling:         Pulmonary    Oxygen at home:    Productive cough:     Wheezing:         Neurologic    Sudden weakness in arms or legs:     Sudden numbness in arms or legs:     Sudden onset of difficulty speaking or slurred speech:    Temporary loss of vision in one eye:     Problems with dizziness:         Gastrointestinal    Blood in stool:     Vomited blood:         Genitourinary    Burning when urinating:     Blood in urine:        Psychiatric    Major depression:         Hematologic    Bleeding problems:    Problems with blood clotting too easily:        Skin    Rashes or ulcers:        Constitutional    Fever or chills:      PHYSICAL EXAMINATION:  Today's Vitals   10/19/20 1148  BP: 104/62  Pulse: 63  Resp: 20  Temp: 98 F (36.7 C)  TempSrc: Temporal  SpO2: 98%  Weight: 193 lb (87.5 kg)   Body mass index is 30.23 kg/m.   General:  WDWN in NAD; vital signs documented above Gait: Not observed HENT: WNL, normocephalic; HOH Pulmonary: normal non-labored breathing Cardiac: regular HR, without carotid bruits Abdomen: soft, NT; aortic pulse is not palpable Skin: without rashes Vascular Exam/Pulses:  Right Left  Radial 2+ (normal) 2+ (  normal)  Popliteal Unable to palpate Unable to palpate  AT 2+ (normal) 2+ (normal)  PT Unable to palpate Unable to palpate   Extremities: without ischemic changes, without Gangrene , without cellulitis; without open wounds Musculoskeletal: no muscle wasting or atrophy  Neurologic: A&O X 3; moving all extremities equally; speech is fluent/normal Psychiatric:  The pt has Normal affect.   Non-Invasive Vascular Imaging:   Carotid Duplex on 10/19/2020: Right:  1-39% ICA  stenosis Left:  40-59% ICA stenosis Vertebrals: Left vertebral artery demonstrates antegrade flow. Right  vertebral artery demonstrates no discernable flow.  Subclavians: Normal flow hemodynamics were seen in bilateral subclavian arteries.   Previous Carotid duplex on 10/20/2019: Right: 1-39% ICA stenosis Left:   40-59% ICA stenosis    ASSESSMENT/PLAN:: 75 y.o. male here for follow up carotid artery stenosis   -duplex today reveals essentially unchanged from previous exam.  Pt remains asymptomatic. -discussed s/s of stroke with pt and he understands should he develop any of these sx, he will go to the nearest ER or call 911. -pt will f/u in one year with carotid duplex -pt will call sooner should they have any issues. -continue statin/plavix   Leontine Locket, [facility] [phone]  Clinic MD:  Oneida Alar

## 2021-01-17 DIAGNOSIS — I10 Essential (primary) hypertension: Secondary | ICD-10-CM | POA: Diagnosis not present

## 2021-01-17 DIAGNOSIS — E1169 Type 2 diabetes mellitus with other specified complication: Secondary | ICD-10-CM | POA: Diagnosis not present

## 2021-01-17 DIAGNOSIS — I779 Disorder of arteries and arterioles, unspecified: Secondary | ICD-10-CM | POA: Diagnosis not present

## 2021-01-17 DIAGNOSIS — E785 Hyperlipidemia, unspecified: Secondary | ICD-10-CM | POA: Diagnosis not present

## 2021-01-17 DIAGNOSIS — Z0000 Encounter for general adult medical examination without abnormal findings: Secondary | ICD-10-CM | POA: Diagnosis not present

## 2021-01-17 DIAGNOSIS — Z8719 Personal history of other diseases of the digestive system: Secondary | ICD-10-CM | POA: Diagnosis not present

## 2021-02-26 ENCOUNTER — Other Ambulatory Visit: Payer: Self-pay | Admitting: Cardiology

## 2021-02-27 ENCOUNTER — Other Ambulatory Visit: Payer: Self-pay

## 2021-02-27 ENCOUNTER — Other Ambulatory Visit: Payer: Self-pay | Admitting: Pharmacist Clinician (PhC)/ Clinical Pharmacy Specialist

## 2021-03-31 ENCOUNTER — Other Ambulatory Visit: Payer: Self-pay | Admitting: Cardiology

## 2021-04-02 NOTE — Telephone Encounter (Signed)
Patient is calling to check on the status of this prescription. He is almost completely out of this medication. He is needing enough medication to last him until his upcoming appointment on 08/08/20.

## 2021-04-24 DIAGNOSIS — Z20822 Contact with and (suspected) exposure to covid-19: Secondary | ICD-10-CM | POA: Diagnosis not present

## 2021-04-24 DIAGNOSIS — U071 COVID-19: Secondary | ICD-10-CM | POA: Diagnosis not present

## 2021-05-31 ENCOUNTER — Telehealth: Payer: Self-pay | Admitting: Cardiology

## 2021-05-31 MED ORDER — CARVEDILOL 12.5 MG PO TABS: ORAL_TABLET | ORAL | 1 refills | Status: DC

## 2021-05-31 MED ORDER — LOSARTAN POTASSIUM 25 MG PO TABS: ORAL_TABLET | ORAL | 3 refills | Status: DC

## 2021-05-31 NOTE — Telephone Encounter (Signed)
*  STAT* If patient is at the pharmacy, call can be transferred to refill team.   1. Which medications need to be refilled? (please list name of each medication and dose if known)  carvedilol (COREG) 12.5 MG tablet losartan (COZAAR) 25 MG tablet  2. Which pharmacy/location (including street and city if local pharmacy) is medication to be sent to? [place], [place]  3. Do they need a 30 day or 90 day supply? 90 with refills   Patient is scheduled to see Dr. Ellyn Hack 08/08/21

## 2021-06-05 DIAGNOSIS — K219 Gastro-esophageal reflux disease without esophagitis: Secondary | ICD-10-CM | POA: Diagnosis not present

## 2021-06-05 DIAGNOSIS — Z86010 Personal history of colonic polyps: Secondary | ICD-10-CM | POA: Diagnosis not present

## 2021-06-20 DIAGNOSIS — E785 Hyperlipidemia, unspecified: Secondary | ICD-10-CM | POA: Diagnosis not present

## 2021-06-20 DIAGNOSIS — E1169 Type 2 diabetes mellitus with other specified complication: Secondary | ICD-10-CM | POA: Diagnosis not present

## 2021-06-20 DIAGNOSIS — I130 Hypertensive heart and chronic kidney disease with heart failure and stage 1 through stage 4 chronic kidney disease, or unspecified chronic kidney disease: Secondary | ICD-10-CM | POA: Diagnosis not present

## 2021-06-20 DIAGNOSIS — I10 Essential (primary) hypertension: Secondary | ICD-10-CM | POA: Diagnosis not present

## 2021-08-08 ENCOUNTER — Other Ambulatory Visit: Payer: Self-pay

## 2021-08-08 ENCOUNTER — Ambulatory Visit: Payer: Medicare Other | Admitting: Cardiology

## 2021-08-08 ENCOUNTER — Encounter: Payer: Self-pay | Admitting: Cardiology

## 2021-08-08 VITALS — BP 100/60 | HR 65 | Ht 68.0 in | Wt 192.8 lb

## 2021-08-08 DIAGNOSIS — I739 Peripheral vascular disease, unspecified: Secondary | ICD-10-CM

## 2021-08-08 DIAGNOSIS — Z9861 Coronary angioplasty status: Secondary | ICD-10-CM

## 2021-08-08 DIAGNOSIS — I25119 Atherosclerotic heart disease of native coronary artery with unspecified angina pectoris: Secondary | ICD-10-CM

## 2021-08-08 DIAGNOSIS — I2510 Atherosclerotic heart disease of native coronary artery without angina pectoris: Secondary | ICD-10-CM

## 2021-08-08 DIAGNOSIS — Z955 Presence of coronary angioplasty implant and graft: Secondary | ICD-10-CM

## 2021-08-08 DIAGNOSIS — E785 Hyperlipidemia, unspecified: Secondary | ICD-10-CM

## 2021-08-08 DIAGNOSIS — I6523 Occlusion and stenosis of bilateral carotid arteries: Secondary | ICD-10-CM

## 2021-08-08 DIAGNOSIS — I10 Essential (primary) hypertension: Secondary | ICD-10-CM

## 2021-08-08 DIAGNOSIS — I255 Ischemic cardiomyopathy: Secondary | ICD-10-CM

## 2021-08-08 DIAGNOSIS — I2109 ST elevation (STEMI) myocardial infarction involving other coronary artery of anterior wall: Secondary | ICD-10-CM

## 2021-08-08 MED ORDER — CARVEDILOL 12.5 MG PO TABS: 12.5000 mg | ORAL_TABLET | Freq: Two times a day (BID) | ORAL | 3 refills | Status: DC

## 2021-08-08 MED ORDER — LOSARTAN POTASSIUM 25 MG PO TABS: ORAL_TABLET | ORAL | 6 refills | Status: DC

## 2021-08-08 MED ORDER — FUROSEMIDE 20 MG PO TABS: 20.0000 mg | ORAL_TABLET | Freq: Every day | ORAL | 6 refills | Status: DC | PRN

## 2021-08-08 MED ORDER — CARVEDILOL 12.5 MG PO TABS: ORAL_TABLET | ORAL | 1 refills | Status: DC

## 2021-08-08 NOTE — Progress Notes (Signed)
Primary Care Provider: [name], MD Cardiologist: Damon Hew, MD Electrophysiologist: None  Clinic Note: Chief Complaint  Patient presents with   Follow-up    Annual.  Doing well.   Coronary Artery Disease    No active angina   Cardiomyopathy    Minimal CHF symptoms.  No real edema.    ===================================  ASSESSMENT/PLAN   Problem List Items Addressed This Visit       Cardiology Problems   ST elevation myocardial infarction (STEMI) of anterior wall, subsequent episode of care ([place]) (Chronic)    Little over 6 years out from his sizable anterior infarct.  Unfortunately had delayed presentation.  Large infarct noted on both echo and Myoview and EF is roughly 35 to 40%.  Not an ICD candidate, and has not had any signs or symptoms of arrhythmias. Following his MI he had a stroke but subsequently is also had a PE.      Relevant Medications   losartan (COZAAR) 25 MG tablet   furosemide (LASIX) 20 MG tablet   carvedilol (COREG) 12.5 MG tablet   Other Relevant Orders   EKG 12-Lead (Completed)   Cardiomyopathy, ischemic: EF most recently 35-40%) (Chronic)    He actually declined ICD in the past.  EF is above the cutoff for he would be a candidate.  Unfortunate, blood pressure not able to tolerate Entresto.  Pressures are borderline for low-dose losartan which are now making PRN.  We will continue carvedilol at 12.5 mg twice daily.  Minimal requirement of diuretic, has PRN Lasix.  If he were to show some evidence of fluid buildup, would potentially consider SGLT2-I.       Relevant Medications   losartan (COZAAR) 25 MG tablet   furosemide (LASIX) 20 MG tablet   carvedilol (COREG) 12.5 MG tablet   CAD-native artery (Chronic)    No active angina.  Significant underlying disease.  He is not overly mobile therefore probably does not have any exertional angina.  Plan: Continue current dose of carvedilol, rosuvastatin -> Making losartan PRN -> On  Plavix for maintenance.  Okay to hold 5-7 days preop for surgeries or procedures.      Relevant Medications   losartan (COZAAR) 25 MG tablet   furosemide (LASIX) 20 MG tablet   carvedilol (COREG) 12.5 MG tablet   Essential hypertension (Chronic)    Blood pressure is actually relatively low today.  He says is usually higher. . Plan: With borderline hypotension today, will actually make losartan and more PRN medication that he will take as needed for systolic pressure greater than they have 120 mmHg. Otherwise we will simply continue carvedilol at current dose. Also on PRN Lasix.  Not using very frequently.      Relevant Medications   losartan (COZAAR) 25 MG tablet   furosemide (LASIX) 20 MG tablet   carvedilol (COREG) 12.5 MG tablet   Hyperlipidemia with target LDL less than 70 (Chronic)    Labs from June look pretty well controlled.  LDL 62 on 40 mg rosuvastatin.  Stable      Relevant Medications   losartan (COZAAR) 25 MG tablet   furosemide (LASIX) 20 MG tablet   carvedilol (COREG) 12.5 MG tablet   Carotid arterial disease (HCC) (Chronic)    Follow-up vascular surgery.  History of right vertebral artery occlusion.  On stable dose of statin and Plavix.      Relevant Medications   losartan (COZAAR) 25 MG tablet   furosemide (LASIX) 20 MG tablet  carvedilol (COREG) 12.5 MG tablet   Coronary artery disease involving native coronary artery of native heart with angina pectoris (HCC) - Primary (Chronic)    Large anterior MI with ischemic cardiomyopathy and reduced EF. Despite significant RCA disease, Myoview did not show any evidence of ischemia.  Plan:  Continue current dose of beta-blocker and statin.  (Rosuvastatin 40 mg daily, carvedilol 12.5 mg twice daily On maintenance dose clopidogrel for CAD, history of stroke and history of PE. Okay to hold Plavix 5-7 days preop for surgeries or procedures. With borderline blood pressures.  Likely losartan will PRN. (-Losartan 25 mg  daily PRN systolic pressures > 166 mmHg)       Relevant Medications   losartan (COZAAR) 25 MG tablet   furosemide (LASIX) 20 MG tablet   carvedilol (COREG) 12.5 MG tablet   Other Relevant Orders   EKG 12-Lead (Completed)   VAS [place] LOWER EXTREMITY ARTERIAL DUPLEX (Completed)   VAS [place] ABI WITH/WO TBI (Completed)     Other   Claudication ([place])    Does not really walk very much.  Does not complain about buttock pain that Goes down his leg.  This is either claudication or potentially neurologic. He has had studies suggesting lower extremity PAD.  Plan: ABI/TBI's with LEA Dopplers      Relevant Orders   VAS [place] LOWER EXTREMITY ARTERIAL DUPLEX (Completed)   VAS [place] ABI WITH/WO TBI (Completed)    ===================================  HPI:    Damon Walker is a 76 y.o. male with a CV History noted below who presents today for annual follow-up.    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  Mild baseline dyspnea.  No PND, orthopnea with minimal edema (only Lasix once a month).  No chest pain or pressure.  No changes  Recent Hospitalizations: None  -> Notably, he did have COVID in September 2022.  Apparently did not have a very bad case.  Did relatively well.  Only had some fatigue for about a week.  Reviewed  CV studies:    The following studies were reviewed today: (if available, images/films reviewed: From Epic Chart or Care Everywhere) Carotid Dopplers March 2022: RICA 1-39%. L prox-mid ICA ~40-59%; L Vert A antegrade flow. R Vert A no discernible flow.  Normal bilateral subclavian  arteries.   Interval History:   Damon Walker returns here today overall doing pretty well from a cardiac standpoint.  He recovered well from the [place].  The only major issue is that he does not exercise very much.  He does try to walk some around the grocery store, but his has no real stamina.  He does have some pain down his legs when he walks bilateral legs. He walks, he does use a shopping cart to lean on for support.  Otherwise, he is doing fine from a cardiac standpoint with no real angina or heart failure symptoms.  Trivial edema.  He may have some exertional dyspnea at baseline simply because he is very deconditioned and does not do much in the way of activity.  Mostly limited by hip pain > dyspnea.  CV Review  of Symptoms (Summary) Cardiovascular ROS: positive for - dyspnea on exertion and very deconditioned/sedentary lifestyle.  Not limited by hip pain.  Trivial edema. negative for - chest pain, irregular heartbeat, orthopnea, palpitations, paroxysmal nocturnal dyspnea, rapid heart rate, shortness of breath, or lightheadedness, dizziness or wooziness, syncope/near syncope or TIA/amaurosis fugax, claudication  We talked about his blood pressure today which is very low, and they tell me that usually his home his pressures ranged from about 115-120/60-70 mmHg.  He denies any significant dizziness or lightheadedness.  No real orthostatic symptoms.  REVIEWED OF SYSTEMS   Review of Systems  Constitutional:  Positive for malaise/fatigue (Pretty deconditioned.  Not a lot of energy). Negative for weight loss.  HENT:  Negative for nosebleeds.   Eyes:  Positive for discharge.  Respiratory:  Negative for cough, shortness of breath and wheezing.   Cardiovascular:  Positive for chest pain.       Per HPI  Gastrointestinal:  Negative for abdominal pain, blood in stool, constipation and melena.  Genitourinary:  Negative for hematuria.  Musculoskeletal:  Positive for back pain, joint pain  (Mostly hip.) and myalgias. Negative for falls.  Neurological:  Positive for dizziness and weakness (Bilateral legs are somewhat weak looks to be related to deconditioning.). Negative for focal weakness.  Psychiatric/Behavioral:  Positive for memory loss. Negative for depression (Seems to be in good spirits.). The patient is not nervous/anxious (No recent anxiety episodes.) and does not have insomnia.    Actually his wife just had a recent stroke.  I have reviewed and (if needed) personally updated the patient's problem list, medications, allergies, past medical and surgical history, social and family history.   PAST MEDICAL HISTORY   Past Medical History:  Diagnosis Date   Carotid arterial disease ([place]) 06/2014; 01/09/2015   Followed by Dr. Donnetta Hutching of [facility] -- a) Carotid u/s: 40-59% bilat ICA stenosis; b) 10/2019: Carotid Dopplers October 20, 3843: R ICA 3-64%, LICA 68-03%.  Right vertebral artery appears occluded.  Normal subclavian flow.  Normal left vertebral artery. 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  MRI Brain: Acute L Occipital Lobe Infarct. Chronic microvascular ischemic changes in the white matter and left pons.  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   Chronic combined systolic and diastolic congestive heart failure, NYHA class 2 ([place]) 05/11/2015 - 05/15/15   a. EF 20-25% with anterior-anteroseptal akinesis (immediately post anterior STEMI).  ; b. 10/10/'16 Echo: EF 35-40% with moderate mid-apical anteroseptal, anterior and apical hypokinesis.   Coronary artery disease involving native heart 05/11/2015   Cath: 3V CAD with 90% prox RCA, 80% mid RCA, 80% OM3, (RCA and OM residual treated medically) 100% LAD - PCI to Prox-Mid LAD with Overlapping  Promus Premier DES 3.0 x 38 & 3.0 x 16 (post-dilated to 3.5 mm)    Essential hypertension    H/O: GI bleed 05/15/2015   s/p Sigmoid Polypectomy 05/04/2015 - prior to STEMI on May 11, 2015; Had GI Bleed following TPA for CVA, while on ASA & Brilinta; Flex Sig - 1. Internal Hemorrhoids, 2. Distal sigmoid polypectomy site with flat [place] status post biopsy and tattoo with 51mL spot over 3 injections 3. Proximal sigmoid polypectomy site seen as well, 4) otherwise normal with no evidence of bleed   Hyperlipidemia with  target LDL less than 70    Ischemic cardiomyopathy 05/11/2015   EF 40% on cath 05/11/2015 after anterior STEMI, EF 20-25% on echo 05/13/2015; 12/2015 =  EF ~45%.   Liver disease, chronic, with cirrhosis ([place]) 11/2019   Mild possible early cirrhotic liver disease noted on CT scan.   Prediabetes Oct 2016   A1C 6.0 in Oct 2016   ST elevation myocardial infarction (STEMI) involving left anterior descending (LAD) coronary artery with complication ([place]) 62/09/6331   100% Prox LAD - PCI with overlapping DES x 2   Tobacco abuse    a. 30 yrs - 1.5 ppd. - Quit 05/11/2015   Vision abnormalities    Right hemianopsia    PAST SURGICAL HISTORY   Past Surgical History:  Procedure Laterality Date   CARDIAC CATHETERIZATION N/A 05/11/2015   Procedure: Left Heart Cath and Coronary Angiography;  Surgeon: Peter M Martinique, MD;  Location: [facility];  Service: Cardiovascular;  100% pLAD (long lesion). RCA - prox 90%, mid 80%. OM2 & OM3 80% (OM2 & 3 not necessarily PCI targets); EF 35-45% with Anterio HK   CARDIAC CATHETERIZATION N/A 05/11/2015   Procedure: Coronary Stent Intervention;  Surgeon: Peter M Martinique, MD;  Location: [facility];  Service: Cardiovascular; PCI to Prox-Mid LAD with Overlapping Promus Premier DES 3.0 x 38 & 3.0 x 16 (post-dilated to 3.5 mm)     FLEXIBLE SIGMOIDOSCOPY N/A 05/18/2015   Procedure: FLEXIBLE SIGMOIDOSCOPY;  Surgeon: Clarene Essex, MD;  Location: [facility];   Service: Endoscopy;  Laterality: N/A;   NM MYOVIEW LTD  08/2016   EF 30% with large anterior-anteroseptal and apical infarct consistent with LAD infarct. His HIGH RISK because of large infarct. No ischemia.   S/P Appendectomy     Age 20   S/P Inguinal Hernia Repair     In his 20's.   TRANSTHORACIC ECHOCARDIOGRAM  05/15/2015   Mild LVH, EF 35-40% - Mod HK of mid-apical anteroseptal, anterior & apical walls.  Gr 1 DD. Mild bilateral Atrial Enlargement.   TRANSTHORACIC ECHOCARDIOGRAM  12/2015   EF up to ~40-45% wiht Anterior-Anteroapical HK.  Mod LV dilation with increased LVEDP.   TRANSTHORACIC ECHOCARDIOGRAM  12/'17; 4/'18   a) EF ~30-35% (in setting of HTN Urgency). - Gr 2 DD;; b) EF 30-35% (per Dr. Sallyanne Kuster - 35-40%). Anterior-anteroapical Akinesis. Gr2 DD w/ high filling pressures. Mild LA dilation. Mild RV dilation.    Immunization History  Administered Date(s) Administered   Influenza,inj,Quad PF,6+ Mos 06/22/2015   Moderna Sars-Covid-2 Vaccination 10/03/2019, 11/06/2019, 07/01/2020    MEDICATIONS/ALLERGIES   Current Meds  Medication Sig   carvedilol (COREG) 12.5 MG tablet Take 1 tablet (12.5 mg total) by mouth 2 (two) times daily.   clopidogrel (PLAVIX) 75 MG tablet Take 1 tablet (75 mg total) by mouth daily.   ferrous sulfate 325 (65 FE) MG tablet Take 325 mg by mouth daily with breakfast.   nitroGLYCERIN (NITROSTAT) 0.4 MG SL tablet Place 1 tablet (0.4 mg total) under the tongue every 5 (five) minutes x 3 doses as needed for chest pain.   pantoprazole (PROTONIX) 20 MG tablet Take 20 mg by mouth daily. As of 04/19/2020 TAKES 1 TABLET (20 MG) ONCE EVERY OTHER DAY TO ALTERNATE WITH FAMOTIDINE   rosuvastatin (CRESTOR) 40 MG tablet Take 1 tablet (40 mg total) by mouth daily at 6 PM.   [DISCONTINUED] carvedilol (COREG) 12.5 MG tablet TAKE 1 TABLET BY MOUTH TWICE DAILY   [DISCONTINUED] furosemide (LASIX) 20 MG  tablet Take 1 tablet by mouth once daily (Patient taking differently: As of  04/19/2020 TAKES 1 TABLET (20 MG) ONCE EVERY OTHER DAY TO ALTERNATE WITH PANTOPRAZOLE)   [DISCONTINUED] losartan (COZAAR) 25 MG tablet TAKE 1 TABLET BY MOUTH ONCE DAILY (Patient taking differently: TAKE 1 TABLET BY MOUTH ONCE DAILY AS NEEDED)    Allergies  Allergen Reactions   Latex Itching, Rash and Other (See Comments)    Blisters    SOCIAL HISTORY/FAMILY HISTORY   Reviewed in Epic:  Pertinent findings:  Social History   Tobacco Use   Smoking status: Former    Packs/day: 1.00    Years: 30.00    Pack years: 30.00    Types: Cigarettes    Quit date: 04/06/2015    Years since quitting: 6.3   Smokeless tobacco: Never   Tobacco comments:    Quit the day of his STEMI  Substance Use Topics   Alcohol use: No    Alcohol/week: 0.0 standard drinks   Drug use: No   Social History   Social History Narrative   Lives in [place] with wife. Originally from [place].    OBJCTIVE -PE, EKG, labs   Wt Readings from Last 3 Encounters:  08/08/21 192 lb 12.8 oz (87.5 kg)  10/19/20 193 lb (87.5 kg)  07/27/20 193 lb (87.5 kg)    Physical Exam: BP 100/60 (BP Location: Right Arm)    Pulse 65    Ht 5\' 8"  (1.727 m)    Wt 192 lb 12.8 oz (87.5 kg)    SpO2 98%    BMI 29.32 kg/m  Physical Exam Vitals reviewed.  Constitutional:      General: He is not in acute distress.    Appearance: Normal appearance. He is obese. He is not ill-appearing or toxic-appearing.     Comments: Well-nourished, well-groomed.  HENT:     Head: Normocephalic and atraumatic.     Ears:     Comments: Very hard of hearing Neck:     Vascular: Carotid bruit (L sided Bruit) present. No JVD.  Cardiovascular:     Rate and Rhythm: Normal rate and regular rhythm. No extrasystoles are present.    Chest Wall: PMI is not displaced.     Pulses: Decreased pulses (Decreased, but palpable.).     Heart sounds: S1 normal and S2 normal. Heart sounds are distant. No murmur heard.   No friction rub. No gallop.  Pulmonary:     Effort:  Pulmonary effort is normal. No respiratory distress.     Breath sounds: Normal breath sounds. No wheezing, rhonchi or rales.  Chest:     Chest wall: No tenderness.  Musculoskeletal:        General: No swelling. Normal range of motion.     Cervical back: Normal range of motion and neck supple.  Skin:    General: Skin is warm and dry.  Neurological:     General: No focal deficit present.     Mental Status: He is alert and oriented to person, place, and time.     Gait: Gait abnormal (Antalgic gait).  Psychiatric:        Mood and Affect: Mood normal.        Behavior: Behavior normal.        Thought Content: Thought content normal.        Judgment: Judgment normal.    Adult ECG Report  Rate: 65 ;  Rhythm: normal sinus rhythm; ASMI, age-indeterminate.S&T wave changes consider inferolateral ischemia.  Narrative Interpretation:  Stable  Recent Labs:  01/17/2021: TC 122, TG 154, HDL 33, LDL 62.  A1c 6.1.  Hgb 13.6.  Cr 1.46, K+ 4.6. Lab Results  Component Value Date   CHOL 109 07/08/2016   HDL 39 (L) 07/08/2016   LDLCALC 54 07/08/2016   TRIG 78 07/08/2016   CHOLHDL 2.8 07/08/2016   Lab Results  Component Value Date   CREATININE 1.19 11/24/2019   BUN 24 11/24/2019   NA 146 (H) 11/24/2019   K 5.2 11/24/2019   CL 105 11/24/2019   CO2 23 11/24/2019   CBC Latest Ref Rng & Units 07/09/2016 07/08/2016 06/20/2015  WBC 4.0 - 10.5 K/uL 9.2 10.8(H) 10.7(H)  Hemoglobin 13.0 - 17.0 g/dL 11.7(L) 12.8(L) 11.4(L)  Hematocrit 39.0 - 52.0 % 36.1(L) 39.8 36.3(L)  Platelets 150 - 400 K/uL 216 248 302    Lab Results  Component Value Date   HGBA1C 5.8 (H) 07/08/2016   Lab Results  Component Value Date   TSH 1.006 05/11/2015    ==================================================  COVID-19 Education: The signs and symptoms of COVID-19 were discussed with the patient and how to seek care for testing (follow up with PCP or arrange E-visit).    I spent a total of 23 minutes with the patient  spent in direct patient consultation.  Additional time spent with chart review  / charting (studies, outside notes, etc): 18 min Total Time: 41 min  Current medicines are reviewed at length with the patient today.  (+/- concerns) n/a  This visit occurred during the SARS-CoV-2 public health emergency.  Safety protocols were in place, including screening questions prior to the visit, additional usage of staff PPE, and extensive cleaning of exam room while observing appropriate contact time as indicated for disinfecting solutions.  Notice: This dictation was prepared with Dragon dictation along with smart phrase technology. Any transcriptional errors that result from this process are unintentional and may not be corrected upon review.  Studies Ordered:   Orders Placed This Encounter  Procedures   EKG 12-Lead   VAS [place] LOWER EXTREMITY ARTERIAL DUPLEX   VAS [place] ABI WITH/WO TBI    Patient Instructions / Medication Changes & Studies & Tests Ordered   Patient Instructions  Medication Instructions:     Change  lasix ( furosemide )  to as needed  medication  Change Losartan  to as needed medicaiton  *If you need a refill on your cardiac medications before your next appointment, please call your pharmacy*   Lab Work:     Testing/Procedures:  Will be schedule at [facility] has requested that you have an ankle brachial index (ABI). During this test an ultrasound and blood pressure cuff are used to evaluate the arteries that supply the arms and legs with blood. Allow thirty minutes for this exam. There are no restrictions or special instructions.  And Your physician has requested that you have a lower extremity arterial duplex. This test is an ultrasound of the arteries in the legs. It looks at arterial blood flow in the legs. Allow one hour for Lower Arterial scans. There are no restrictions or special instructions    Follow-Up: At [facility], you and  your health needs are our priority.  As part of our continuing mission to provide you with exceptional heart care, we have created designated Provider Care Teams.  These Care Teams include your primary Cardiologist (physician) and Advanced Practice Providers (APPs -  Physician Assistants and Nurse Practitioners) who all work  together to provide you with the care you need, when you need it.     Your next appointment:   12 month(s)  The format for your next appointment:   In Person  Provider:   Glenetta Hew, MD    Other Instructions      Damon Walker, M.D., M.S. Interventional Cardiologist   Pager # [phone] Phone # [phone] [address] [place], [place] [address]   Thank you for choosing [facility]!!

## 2021-08-08 NOTE — Patient Instructions (Addendum)
Medication Instructions:     Change  lasix ( furosemide )  to as needed  medication  Change Losartan  to as needed medicaiton  *If you need a refill on your cardiac medications before your next appointment, please call your pharmacy*   Lab Work:     Testing/Procedures:  Will be schedule at [place] has requested that you have an ankle brachial index (ABI). During this test an ultrasound and blood pressure cuff are used to evaluate the arteries that supply the arms and legs with blood. Allow thirty minutes for this exam. There are no restrictions or special instructions.  And Your physician has requested that you have a lower extremity arterial duplex. This test is an ultrasound of the arteries in the legs. It looks at arterial blood flow in the legs. Allow one hour for Lower Arterial scans. There are no restrictions or special instructions    Follow-Up: At [facility], you and your health needs are our priority.  As part of our continuing mission to provide you with exceptional heart care, we have created designated Provider Care Teams.  These Care Teams include your primary Cardiologist (physician) and Advanced Practice Providers (APPs -  Physician Assistants and Nurse Practitioners) who all work together to provide you with the care you need, when you need it.     Your next appointment:   12 month(s)  The format for your next appointment:   In Person  Provider:   Glenetta Hew, MD    Other Instructions

## 2021-08-14 DIAGNOSIS — N183 Chronic kidney disease, stage 3 unspecified: Secondary | ICD-10-CM | POA: Diagnosis not present

## 2021-08-14 DIAGNOSIS — I10 Essential (primary) hypertension: Secondary | ICD-10-CM | POA: Diagnosis not present

## 2021-08-14 DIAGNOSIS — E785 Hyperlipidemia, unspecified: Secondary | ICD-10-CM | POA: Diagnosis not present

## 2021-08-14 DIAGNOSIS — E1169 Type 2 diabetes mellitus with other specified complication: Secondary | ICD-10-CM | POA: Diagnosis not present

## 2021-08-23 ENCOUNTER — Ambulatory Visit (HOSPITAL_COMMUNITY)
Admission: RE | Admit: 2021-08-23 | Discharge: 2021-08-23 | Disposition: A | Discharge status: Home / Self Care | Payer: Medicare Other | Source: Ambulatory Visit | Attending: Internal Medicine | Admitting: Internal Medicine

## 2021-08-23 ENCOUNTER — Other Ambulatory Visit: Payer: Self-pay

## 2021-08-23 DIAGNOSIS — I25119 Atherosclerotic heart disease of native coronary artery with unspecified angina pectoris: Secondary | ICD-10-CM | POA: Diagnosis not present

## 2021-08-23 DIAGNOSIS — I739 Peripheral vascular disease, unspecified: Secondary | ICD-10-CM | POA: Insufficient documentation

## 2021-08-25 ENCOUNTER — Encounter: Payer: Self-pay | Admitting: Cardiology

## 2021-08-25 NOTE — Assessment & Plan Note (Signed)
Little over 6 years out from his sizable anterior infarct.  Unfortunately had delayed presentation.  Large infarct noted on both echo and Myoview and EF is roughly 35 to 40%.  Not an ICD candidate, and has not had any signs or symptoms of arrhythmias. Following his MI he had a stroke but subsequently is also had a PE.

## 2021-08-25 NOTE — Assessment & Plan Note (Signed)
Follow-up vascular surgery.  History of right vertebral artery occlusion.  On stable dose of statin and Plavix.

## 2021-08-25 NOTE — Assessment & Plan Note (Signed)
Does not really walk very much.  Does not complain about buttock pain that Goes down his leg.  This is either claudication or potentially neurologic. He has had studies suggesting lower extremity PAD.  Plan: ABI/TBI's with LEA Dopplers

## 2021-08-25 NOTE — Assessment & Plan Note (Signed)
Large anterior MI with ischemic cardiomyopathy and reduced EF. Despite significant RCA disease, Myoview did not show any evidence of ischemia.  Plan:   Continue current dose of beta-blocker and statin.  (Rosuvastatin 40 mg daily, carvedilol 12.5 mg twice daily  On maintenance dose clopidogrel for CAD, history of stroke and history of PE.  Okay to hold Plavix 5-7 days preop for surgeries or procedures.  With borderline blood pressures.  Likely losartan will PRN.  (-Losartan 25 mg daily PRN systolic pressures > 321 mmHg)

## 2021-08-25 NOTE — Assessment & Plan Note (Signed)
Labs from June look pretty well controlled.  LDL 62 on 40 mg rosuvastatin.  Stable

## 2021-08-25 NOTE — Assessment & Plan Note (Signed)
He actually declined ICD in the past.  EF is above the cutoff for he would be a candidate.  Unfortunate, blood pressure not able to tolerate Entresto.  Pressures are borderline for low-dose losartan which are now making PRN.  We will continue carvedilol at 12.5 mg twice daily.  Minimal requirement of diuretic, has PRN Lasix.  If he were to show some evidence of fluid buildup, would potentially consider SGLT2-I.

## 2021-08-25 NOTE — Assessment & Plan Note (Signed)
No active angina.  Significant underlying disease.  He is not overly mobile therefore probably does not have any exertional angina.  Plan: Continue current dose of carvedilol, rosuvastatin -> Making losartan PRN -> On Plavix for maintenance.  Okay to hold 5-7 days preop for surgeries or procedures.

## 2021-08-25 NOTE — Assessment & Plan Note (Signed)
Blood pressure is actually relatively low today.  He says is usually higher. . Plan: With borderline hypotension today, will actually make losartan and more PRN medication that he will take as needed for systolic pressure greater than they have 120 mmHg. Otherwise we will simply continue carvedilol at current dose. Also on PRN Lasix.  Not using very frequently.

## 2021-09-04 DIAGNOSIS — I10 Essential (primary) hypertension: Secondary | ICD-10-CM | POA: Diagnosis not present

## 2021-09-04 DIAGNOSIS — N183 Chronic kidney disease, stage 3 unspecified: Secondary | ICD-10-CM | POA: Diagnosis not present

## 2021-09-04 DIAGNOSIS — E1169 Type 2 diabetes mellitus with other specified complication: Secondary | ICD-10-CM | POA: Diagnosis not present

## 2021-09-04 DIAGNOSIS — I130 Hypertensive heart and chronic kidney disease with heart failure and stage 1 through stage 4 chronic kidney disease, or unspecified chronic kidney disease: Secondary | ICD-10-CM | POA: Diagnosis not present

## 2021-10-05 ENCOUNTER — Other Ambulatory Visit: Payer: Self-pay

## 2021-10-05 DIAGNOSIS — I6523 Occlusion and stenosis of bilateral carotid arteries: Secondary | ICD-10-CM

## 2021-10-15 DIAGNOSIS — I5022 Chronic systolic (congestive) heart failure: Secondary | ICD-10-CM | POA: Diagnosis not present

## 2021-10-15 DIAGNOSIS — E1169 Type 2 diabetes mellitus with other specified complication: Secondary | ICD-10-CM | POA: Diagnosis not present

## 2021-10-15 DIAGNOSIS — K219 Gastro-esophageal reflux disease without esophagitis: Secondary | ICD-10-CM | POA: Diagnosis not present

## 2021-10-15 DIAGNOSIS — I10 Essential (primary) hypertension: Secondary | ICD-10-CM | POA: Diagnosis not present

## 2021-10-22 NOTE — Progress Notes (Signed)
?Office Note  ? ? ? ?CC:  follow up ?Requesting Provider:  [name], MD ? ?HPI: Damon Walker is a 76 y.o. (Sep 18, 1945) male who presents for routine follow up of carotid artery stenosis. He has history of prior left occipital lobe infarct. He had monocular right eye vision loss. His carotid stenosis was not felt to be cause of his CVA. He was last seen in 1 year ago and at that time he remained asymptomatic.  His duplex revealed right ICA stenosis of 1-39% and left ICA stenosis of 40-59%. ? ?Today patient denies any amaurosis fugax, speech difficulties, weakness, numbness, paralysis or clumsiness or facial droop.  He denies any claudication, wounds or rest pain. His walking is limited by his hip pain. He has had this evaluated by his Cardiologist with concerns about PAD. His non invasive studies were essentially normal. He also has seen orthopedics and no significant findings were found per patient and his wife.  ?  ?He is followed by Dr. Ellyn Hack for hx of MI, CAD, HTN, & cardiomyopathy. ? ?The pt is on a statin for cholesterol management.  ?The pt is not on a daily aspirin.   Other AC:  Plavix  ?The pt is on BB, ARB for hypertension.   ?The pt is not diabetic.  ?Tobacco hx:  Former, 2021 ? ?Past Medical History:  ?Diagnosis Date  ? Carotid arterial disease ([place]) 06/2014; 01/09/2015  ? Followed by Dr. Donnetta Hutching of [facility] -- a) Carotid u/s: 40-59% bilat ICA stenosis; b) 10/2019: Carotid Dopplers October 19, 2128: R ICA 8-65%, LICA 78-46%.  Right vertebral artery appears occluded.  Normal subclavian flow.  Normal left vertebral artery. -->  No change since 2020  ? 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 -- Given TPA.  MRI Brain: Acute L Occipital Lobe Infarct. Chronic microvascular ischemic changes in the white matter and left pons.  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 due to in-utero infarct;;; R Hemianopsia  ? Cholelithiasis with obstruction 11/2019  ?  Cholelithiasis noted without biliary obstruction or inflammation.  ? Chronic combined systolic and diastolic congestive heart failure, NYHA class 2 ([place]) 05/11/2015 - 05/15/15  ? a. EF 20-25% with anterior-anteroseptal akinesis (immediately post anterior STEMI).  ; b. 10/10/'16 Echo: EF 35-40% with moderate mid-apical anteroseptal, anterior and apical hypokinesis.  ? Coronary artery disease involving native heart 05/11/2015  ? Cath: 3V CAD with 90% prox RCA, 80% mid RCA, 80% OM3, (RCA and OM residual treated medically) 100% LAD - PCI to Prox-Mid LAD with Overlapping Promus Premier DES 3.0 x 38 & 3.0 x 16 (post-dilated to 3.5 mm)   ? Essential hypertension   ? H/O: GI bleed 05/15/2015  ? s/p Sigmoid Polypectomy 05/04/2015 - prior to STEMI on May 11, 2015; Had GI Bleed following TPA for CVA, while on ASA & Brilinta; Flex Sig - 1. Internal Hemorrhoids, 2. Distal sigmoid polypectomy site with flat Whitebase status post biopsy and tattoo with 24m spot over 3 injections 3. Proximal sigmoid polypectomy site seen as well, 4) otherwise normal with no evidence of bleed  ? Hyperlipidemia with target LDL less than 70   ? Ischemic cardiomyopathy 05/11/2015  ? EF 40% on cath 05/11/2015 after anterior STEMI, EF 20-25% on echo 05/13/2015; 12/2015 =  EF ~45%.  ? Liver disease, chronic, [place]  ? Mild possible early cirrhotic liver disease noted on CT scan.  ? Prediabetes Oct 2016  ? A1C 6.0 in Oct  2016  ? ST elevation myocardial infarction (STEMI) involving left anterior descending (LAD) coronary artery with complication ([place]) 01/06/159  ? 100% Prox LAD - PCI with overlapping DES x 2  ? Tobacco abuse   ? a. 30 yrs - 1.5 ppd. - Quit 05/11/2015  ? Type 2 diabetes mellitus with other specified complication ([place]) 08/13/3233  ? Vision abnormalities   ? Right hemianopsia  ? ? ?Past Surgical History:  ?Procedure Laterality Date  ? CARDIAC CATHETERIZATION N/A 05/11/2015  ? Procedure: Left Heart Cath and Coronary Angiography;   Surgeon: Peter M Martinique, MD;  Location: [facility];  Service: Cardiovascular;  100% pLAD (long lesion). RCA - prox 90%, mid 80%. OM2 & OM3 80% (OM2 & 3 not necessarily PCI targets); EF 35-45% with Anterio HK  ? CARDIAC CATHETERIZATION N/A 05/11/2015  ? Procedure: Coronary Stent Intervention;  Surgeon: Peter M Martinique, MD;  Location: [facility];  Service: Cardiovascular; PCI to Prox-Mid LAD with Overlapping Promus Premier DES 3.0 x 38 & 3.0 x 16 (post-dilated to 3.5 mm)    ? FLEXIBLE SIGMOIDOSCOPY N/A 05/18/2015  ? Procedure: FLEXIBLE SIGMOIDOSCOPY;  Surgeon: Clarene Essex, MD;  Location: [facility];  Service: Endoscopy;  Laterality: N/A;  ? NM MYOVIEW LTD  08/2016  ? EF 30% with large anterior-anteroseptal and apical infarct consistent with LAD infarct. His HIGH RISK because of large infarct. No ischemia.  ? S/P Appendectomy    ? Age 85  ? S/P Inguinal Hernia Repair    ? In his 19's.  ? TRANSTHORACIC ECHOCARDIOGRAM  05/15/2015  ? Mild LVH, EF 35-40% - Mod HK of mid-apical anteroseptal, anterior & apical walls.  Gr 1 DD. Mild bilateral Atrial Enlargement.  ? TRANSTHORACIC ECHOCARDIOGRAM  12/2015  ? EF up to ~40-45% wiht Anterior-Anteroapical HK.  Mod LV dilation with increased LVEDP.  ? TRANSTHORACIC ECHOCARDIOGRAM  12/'17; 4/'18  ? a) EF ~30-35% (in setting of HTN Urgency). - Gr 2 DD;; b) EF 30-35% (per Dr. Sallyanne Kuster - 35-40%). Anterior-anteroapical Akinesis. Gr2 DD w/ high filling pressures. Mild LA dilation. Mild RV dilation.  ? ? ?Social History  ? ?Socioeconomic History  ? Marital status: Married  ?  Spouse name: Not on file  ? Number of children: 3  ? Years of education: Not on file  ? Highest education level: Not on file  ?Occupational History  ? Occupation: retired from [employer]  ?Tobacco Use  ? Smoking status: Former  ?  Packs/day: 1.00  ?  Years: 30.00  ?  Pack years: 30.00  ?  Types: Cigarettes  ?  Quit date: 04/06/2015  ?  Years since quitting: 6.5  ?  Passive exposure: Current (Wife is  a smoker)  ? Smokeless tobacco: Never  ? Tobacco comments:  ?  Quit the day of his STEMI  ?Substance and Sexual Activity  ? Alcohol use: No  ?  Alcohol/week: 0.0 standard drinks  ? Drug use: No  ? Sexual activity: Not on file  ?Other Topics Concern  ? Not on file  ?Social History Narrative  ? Lives in [place] with wife. Originally from [place].  ? ?Social Determinants of Health  ? ?Financial Resource Strain: Not on file  ?Food Insecurity: Not on file  ?Transportation Needs: Not on file  ?Physical Activity: Not on file  ?Stress: Not on file  ?Social Connections: Not on file  ?Intimate Partner Violence: Not on file  ? ? ?Family History  ?Adopted: Yes  ?Problem Relation Age of Onset  ?  Other Mother   ?     eczema   ? Cancer Mother   ? Other Other   ?     Adopted - unaware of biological parent's histories.  ? ? ?Current Outpatient Medications  ?Medication Sig Dispense Refill  ? carvedilol (COREG) 12.5 MG tablet Take 1 tablet (12.5 mg total) by mouth 2 (two) times daily. 180 tablet 3  ? clopidogrel (PLAVIX) 75 MG tablet Take 1 tablet (75 mg total) by mouth daily. 30 tablet 1  ? ferrous sulfate 325 (65 FE) MG tablet Take 325 mg by mouth daily with breakfast.    ? furosemide (LASIX) 20 MG tablet Take 1 tablet (20 mg total) by mouth daily as needed. 30 tablet 6  ? losartan (COZAAR) 25 MG tablet TAKE 1 TABLET BY MOUTH ONCE DAILY AS NEEDED 20 tablet 6  ? nitroGLYCERIN (NITROSTAT) 0.4 MG SL tablet Place 1 tablet (0.4 mg total) under the tongue every 5 (five) minutes x 3 doses as needed for chest pain. 25 tablet 3  ? pantoprazole (PROTONIX) 20 MG tablet Take 20 mg by mouth daily. As of 04/19/2020 TAKES 1 TABLET (20 MG) ONCE EVERY OTHER DAY TO ALTERNATE WITH FAMOTIDINE    ? rosuvastatin (CRESTOR) 40 MG tablet Take 1 tablet (40 mg total) by mouth daily at 6 PM. 90 tablet 3  ? ?No current facility-administered medications for this visit.  ? ? ?Allergies  ?Allergen Reactions  ? Latex Itching, Rash and Other (See Comments)  ?  Blisters   ? ? ? ?REVIEW OF SYSTEMS:  ? ?'[X]'$  denotes positive finding, '[ ]'$  denotes negative finding ?Cardiac  Comments:  ?Chest pain or chest pressure:    ?Shortness of breath upon exertion:    ?Short of breath when lyi

## 2021-10-23 ENCOUNTER — Other Ambulatory Visit: Payer: Self-pay

## 2021-10-23 ENCOUNTER — Ambulatory Visit: Payer: Medicare Other | Admitting: Physician Assistant

## 2021-10-23 ENCOUNTER — Ambulatory Visit (HOSPITAL_COMMUNITY)
Admission: RE | Admit: 2021-10-23 | Discharge: 2021-10-23 | Disposition: A | Discharge status: Home / Self Care | Payer: Medicare Other | Source: Ambulatory Visit | Attending: Vascular Surgery | Admitting: Vascular Surgery

## 2021-10-23 VITALS — BP 117/62 | HR 62 | Temp 98.0°F | Resp 20 | Ht 68.0 in | Wt 192.9 lb

## 2021-10-23 DIAGNOSIS — Z860100 Personal history of colon polyps, unspecified: Secondary | ICD-10-CM | POA: Insufficient documentation

## 2021-10-23 DIAGNOSIS — I6523 Occlusion and stenosis of bilateral carotid arteries: Secondary | ICD-10-CM | POA: Diagnosis not present

## 2021-10-23 DIAGNOSIS — D3A8 Other benign neuroendocrine tumors: Secondary | ICD-10-CM | POA: Insufficient documentation

## 2021-10-23 DIAGNOSIS — E1169 Type 2 diabetes mellitus with other specified complication: Secondary | ICD-10-CM

## 2021-10-23 DIAGNOSIS — H9190 Unspecified hearing loss, unspecified ear: Secondary | ICD-10-CM | POA: Insufficient documentation

## 2021-10-23 DIAGNOSIS — Z8719 Personal history of other diseases of the digestive system: Secondary | ICD-10-CM | POA: Insufficient documentation

## 2021-10-23 DIAGNOSIS — R198 Other specified symptoms and signs involving the digestive system and abdomen: Secondary | ICD-10-CM | POA: Insufficient documentation

## 2021-10-23 DIAGNOSIS — L409 Psoriasis, unspecified: Secondary | ICD-10-CM | POA: Insufficient documentation

## 2021-10-23 HISTORY — DX: Type 2 diabetes mellitus with other specified complication: E11.69

## 2022-01-25 DIAGNOSIS — K219 Gastro-esophageal reflux disease without esophagitis: Secondary | ICD-10-CM | POA: Diagnosis not present

## 2022-01-25 DIAGNOSIS — I10 Essential (primary) hypertension: Secondary | ICD-10-CM | POA: Diagnosis not present

## 2022-01-25 DIAGNOSIS — E785 Hyperlipidemia, unspecified: Secondary | ICD-10-CM | POA: Diagnosis not present

## 2022-01-25 DIAGNOSIS — N1831 Chronic kidney disease, stage 3a: Secondary | ICD-10-CM | POA: Diagnosis not present

## 2022-02-12 DIAGNOSIS — I779 Disorder of arteries and arterioles, unspecified: Secondary | ICD-10-CM | POA: Diagnosis not present

## 2022-02-12 DIAGNOSIS — E785 Hyperlipidemia, unspecified: Secondary | ICD-10-CM | POA: Diagnosis not present

## 2022-02-12 DIAGNOSIS — Z8719 Personal history of other diseases of the digestive system: Secondary | ICD-10-CM | POA: Diagnosis not present

## 2022-02-12 DIAGNOSIS — Z0000 Encounter for general adult medical examination without abnormal findings: Secondary | ICD-10-CM | POA: Diagnosis not present

## 2022-02-12 DIAGNOSIS — I10 Essential (primary) hypertension: Secondary | ICD-10-CM | POA: Diagnosis not present

## 2022-02-12 DIAGNOSIS — I5022 Chronic systolic (congestive) heart failure: Secondary | ICD-10-CM | POA: Diagnosis not present

## 2022-04-11 DIAGNOSIS — I10 Essential (primary) hypertension: Secondary | ICD-10-CM | POA: Diagnosis not present

## 2022-04-11 DIAGNOSIS — I5022 Chronic systolic (congestive) heart failure: Secondary | ICD-10-CM | POA: Diagnosis not present

## 2022-04-11 DIAGNOSIS — N1831 Chronic kidney disease, stage 3a: Secondary | ICD-10-CM | POA: Diagnosis not present

## 2022-04-11 DIAGNOSIS — K219 Gastro-esophageal reflux disease without esophagitis: Secondary | ICD-10-CM | POA: Diagnosis not present

## 2022-04-21 ENCOUNTER — Observation Stay (HOSPITAL_COMMUNITY): Payer: Medicare Other

## 2022-04-21 ENCOUNTER — Other Ambulatory Visit: Payer: Self-pay

## 2022-04-21 ENCOUNTER — Emergency Department (HOSPITAL_COMMUNITY): Payer: Medicare Other

## 2022-04-21 ENCOUNTER — Inpatient Hospital Stay (HOSPITAL_COMMUNITY)
Admission: EM | Admit: 2022-04-21 | Discharge: 2022-05-01 | DRG: 034 | Disposition: A | Discharge status: Rehabilitation Facility | Payer: Medicare Other | Attending: Family Medicine | Admitting: Family Medicine

## 2022-04-21 DIAGNOSIS — Z87891 Personal history of nicotine dependence: Secondary | ICD-10-CM

## 2022-04-21 DIAGNOSIS — E876 Hypokalemia: Secondary | ICD-10-CM

## 2022-04-21 DIAGNOSIS — I6522 Occlusion and stenosis of left carotid artery: Secondary | ICD-10-CM | POA: Diagnosis present

## 2022-04-21 DIAGNOSIS — G319 Degenerative disease of nervous system, unspecified: Secondary | ICD-10-CM | POA: Diagnosis not present

## 2022-04-21 DIAGNOSIS — Z7902 Long term (current) use of antithrombotics/antiplatelets: Secondary | ICD-10-CM

## 2022-04-21 DIAGNOSIS — J9601 Acute respiratory failure with hypoxia: Secondary | ICD-10-CM | POA: Diagnosis not present

## 2022-04-21 DIAGNOSIS — I248 Other forms of acute ischemic heart disease: Secondary | ICD-10-CM | POA: Diagnosis not present

## 2022-04-21 DIAGNOSIS — Z91040 Latex allergy status: Secondary | ICD-10-CM

## 2022-04-21 DIAGNOSIS — K219 Gastro-esophageal reflux disease without esophagitis: Secondary | ICD-10-CM | POA: Diagnosis not present

## 2022-04-21 DIAGNOSIS — F32A Depression, unspecified: Secondary | ICD-10-CM | POA: Diagnosis present

## 2022-04-21 DIAGNOSIS — Z8673 Personal history of transient ischemic attack (TIA), and cerebral infarction without residual deficits: Secondary | ICD-10-CM

## 2022-04-21 DIAGNOSIS — Z20822 Contact with and (suspected) exposure to covid-19: Secondary | ICD-10-CM | POA: Diagnosis not present

## 2022-04-21 DIAGNOSIS — I252 Old myocardial infarction: Secondary | ICD-10-CM | POA: Diagnosis not present

## 2022-04-21 DIAGNOSIS — G479 Sleep disorder, unspecified: Secondary | ICD-10-CM | POA: Diagnosis not present

## 2022-04-21 DIAGNOSIS — E1169 Type 2 diabetes mellitus with other specified complication: Secondary | ICD-10-CM | POA: Diagnosis present

## 2022-04-21 DIAGNOSIS — N183 Chronic kidney disease, stage 3 unspecified: Secondary | ICD-10-CM | POA: Diagnosis not present

## 2022-04-21 DIAGNOSIS — I6389 Other cerebral infarction: Secondary | ICD-10-CM | POA: Diagnosis not present

## 2022-04-21 DIAGNOSIS — Z955 Presence of coronary angioplasty implant and graft: Secondary | ICD-10-CM

## 2022-04-21 DIAGNOSIS — I130 Hypertensive heart and chronic kidney disease with heart failure and stage 1 through stage 4 chronic kidney disease, or unspecified chronic kidney disease: Secondary | ICD-10-CM | POA: Diagnosis present

## 2022-04-21 DIAGNOSIS — E785 Hyperlipidemia, unspecified: Secondary | ICD-10-CM | POA: Diagnosis present

## 2022-04-21 DIAGNOSIS — Z9861 Coronary angioplasty status: Secondary | ICD-10-CM | POA: Diagnosis not present

## 2022-04-21 DIAGNOSIS — I639 Cerebral infarction, unspecified: Secondary | ICD-10-CM | POA: Diagnosis not present

## 2022-04-21 DIAGNOSIS — E440 Moderate protein-calorie malnutrition: Secondary | ICD-10-CM | POA: Diagnosis not present

## 2022-04-21 DIAGNOSIS — I6523 Occlusion and stenosis of bilateral carotid arteries: Secondary | ICD-10-CM | POA: Diagnosis not present

## 2022-04-21 DIAGNOSIS — Z6828 Body mass index (BMI) 28.0-28.9, adult: Secondary | ICD-10-CM | POA: Diagnosis not present

## 2022-04-21 DIAGNOSIS — I255 Ischemic cardiomyopathy: Secondary | ICD-10-CM | POA: Diagnosis present

## 2022-04-21 DIAGNOSIS — E1122 Type 2 diabetes mellitus with diabetic chronic kidney disease: Secondary | ICD-10-CM | POA: Diagnosis present

## 2022-04-21 DIAGNOSIS — I69351 Hemiplegia and hemiparesis following cerebral infarction affecting right dominant side: Secondary | ICD-10-CM | POA: Diagnosis not present

## 2022-04-21 DIAGNOSIS — K7460 Unspecified cirrhosis of liver: Secondary | ICD-10-CM | POA: Diagnosis not present

## 2022-04-21 DIAGNOSIS — N179 Acute kidney failure, unspecified: Secondary | ICD-10-CM | POA: Diagnosis not present

## 2022-04-21 DIAGNOSIS — I161 Hypertensive emergency: Secondary | ICD-10-CM | POA: Diagnosis not present

## 2022-04-21 DIAGNOSIS — I110 Hypertensive heart disease with heart failure: Secondary | ICD-10-CM | POA: Diagnosis not present

## 2022-04-21 DIAGNOSIS — I5040 Unspecified combined systolic (congestive) and diastolic (congestive) heart failure: Secondary | ICD-10-CM | POA: Diagnosis not present

## 2022-04-21 DIAGNOSIS — I2510 Atherosclerotic heart disease of native coronary artery without angina pectoris: Secondary | ICD-10-CM | POA: Diagnosis not present

## 2022-04-21 DIAGNOSIS — N1831 Chronic kidney disease, stage 3a: Secondary | ICD-10-CM | POA: Diagnosis present

## 2022-04-21 DIAGNOSIS — I10 Essential (primary) hypertension: Secondary | ICD-10-CM | POA: Diagnosis not present

## 2022-04-21 DIAGNOSIS — J810 Acute pulmonary edema: Secondary | ICD-10-CM | POA: Diagnosis not present

## 2022-04-21 DIAGNOSIS — R7303 Prediabetes: Secondary | ICD-10-CM | POA: Diagnosis not present

## 2022-04-21 DIAGNOSIS — R410 Disorientation, unspecified: Secondary | ICD-10-CM | POA: Diagnosis not present

## 2022-04-21 DIAGNOSIS — Z781 Physical restraint status: Secondary | ICD-10-CM

## 2022-04-21 DIAGNOSIS — R29810 Facial weakness: Secondary | ICD-10-CM | POA: Diagnosis present

## 2022-04-21 DIAGNOSIS — I5042 Chronic combined systolic (congestive) and diastolic (congestive) heart failure: Secondary | ICD-10-CM | POA: Diagnosis not present

## 2022-04-21 DIAGNOSIS — I63231 Cerebral infarction due to unspecified occlusion or stenosis of right carotid arteries: Secondary | ICD-10-CM | POA: Diagnosis not present

## 2022-04-21 DIAGNOSIS — L03113 Cellulitis of right upper limb: Secondary | ICD-10-CM | POA: Diagnosis not present

## 2022-04-21 DIAGNOSIS — G4700 Insomnia, unspecified: Secondary | ICD-10-CM | POA: Diagnosis not present

## 2022-04-21 DIAGNOSIS — I63512 Cerebral infarction due to unspecified occlusion or stenosis of left middle cerebral artery: Secondary | ICD-10-CM | POA: Diagnosis not present

## 2022-04-21 DIAGNOSIS — R29818 Other symptoms and signs involving the nervous system: Secondary | ICD-10-CM | POA: Diagnosis not present

## 2022-04-21 DIAGNOSIS — R0603 Acute respiratory distress: Secondary | ICD-10-CM | POA: Diagnosis not present

## 2022-04-21 DIAGNOSIS — I672 Cerebral atherosclerosis: Secondary | ICD-10-CM | POA: Diagnosis not present

## 2022-04-21 DIAGNOSIS — R451 Restlessness and agitation: Secondary | ICD-10-CM | POA: Diagnosis not present

## 2022-04-21 DIAGNOSIS — I779 Disorder of arteries and arterioles, unspecified: Secondary | ICD-10-CM | POA: Diagnosis present

## 2022-04-21 DIAGNOSIS — H9190 Unspecified hearing loss, unspecified ear: Secondary | ICD-10-CM | POA: Diagnosis not present

## 2022-04-21 DIAGNOSIS — H5461 Unqualified visual loss, right eye, normal vision left eye: Secondary | ICD-10-CM | POA: Diagnosis present

## 2022-04-21 DIAGNOSIS — J9602 Acute respiratory failure with hypercapnia: Secondary | ICD-10-CM | POA: Diagnosis not present

## 2022-04-21 DIAGNOSIS — Z79899 Other long term (current) drug therapy: Secondary | ICD-10-CM | POA: Diagnosis not present

## 2022-04-21 DIAGNOSIS — R29704 NIHSS score 4: Secondary | ICD-10-CM | POA: Diagnosis present

## 2022-04-21 DIAGNOSIS — R4701 Aphasia: Secondary | ICD-10-CM | POA: Diagnosis present

## 2022-04-21 DIAGNOSIS — I69320 Aphasia following cerebral infarction: Secondary | ICD-10-CM | POA: Diagnosis not present

## 2022-04-21 DIAGNOSIS — Z01818 Encounter for other preprocedural examination: Secondary | ICD-10-CM | POA: Diagnosis not present

## 2022-04-21 DIAGNOSIS — I509 Heart failure, unspecified: Secondary | ICD-10-CM | POA: Diagnosis not present

## 2022-04-21 LAB — CBG MONITORING, ED: Glucose-Capillary: 95 mg/dL (ref 70–99)

## 2022-04-21 LAB — RAPID URINE DRUG SCREEN, HOSP PERFORMED
Amphetamines: NOT DETECTED
Barbiturates: NOT DETECTED
Benzodiazepines: NOT DETECTED
Cocaine: NOT DETECTED
Opiates: NOT DETECTED
Tetrahydrocannabinol: NOT DETECTED

## 2022-04-21 LAB — COMPREHENSIVE METABOLIC PANEL
ALT: 20 U/L (ref 0–44)
AST: 24 U/L (ref 15–41)
Albumin: 3.6 g/dL (ref 3.5–5.0)
Alkaline Phosphatase: 42 U/L (ref 38–126)
Anion gap: 8 (ref 5–15)
BUN: 16 mg/dL (ref 8–23)
CO2: 25 mmol/L (ref 22–32)
Calcium: 9.3 mg/dL (ref 8.9–10.3)
Chloride: 107 mmol/L (ref 98–111)
Creatinine, Ser: 1.3 mg/dL — ABNORMAL HIGH (ref 0.61–1.24)
GFR, Estimated: 56 mL/min — ABNORMAL LOW (ref >60)
Glucose, Bld: 99 mg/dL (ref 70–99)
Potassium: 4.1 mmol/L (ref 3.5–5.1)
Sodium: 140 mmol/L (ref 135–145)
Total Bilirubin: 0.6 mg/dL (ref 0.3–1.2)
Total Protein: 7.4 g/dL (ref 6.5–8.1)

## 2022-04-21 LAB — I-STAT CHEM 8, ED
BUN: 15 mg/dL (ref 8–23)
Calcium, Ion: 1.12 mmol/L — ABNORMAL LOW (ref 1.15–1.40)
Chloride: 107 mmol/L (ref 98–111)
Creatinine, Ser: 1.2 mg/dL (ref 0.61–1.24)
Glucose, Bld: 97 mg/dL (ref 70–99)
HCT: 41 % (ref 39.0–52.0)
Hemoglobin: 13.9 g/dL (ref 13.0–17.0)
Potassium: 4.1 mmol/L (ref 3.5–5.1)
Sodium: 142 mmol/L (ref 135–145)
TCO2: 23 mmol/L (ref 22–32)

## 2022-04-21 LAB — CBC
HCT: 41.5 % (ref 39.0–52.0)
Hemoglobin: 13.6 g/dL (ref 13.0–17.0)
MCH: 30.6 pg (ref 26.0–34.0)
MCHC: 32.8 g/dL (ref 30.0–36.0)
MCV: 93.3 fL (ref 80.0–100.0)
Platelets: 215 10*3/uL (ref 150–400)
RBC: 4.45 MIL/uL (ref 4.22–5.81)
RDW: 12.7 % (ref 11.5–15.5)
WBC: 8.8 10*3/uL (ref 4.0–10.5)
nRBC: 0 % (ref 0.0–0.2)

## 2022-04-21 LAB — ETHANOL: Alcohol, Ethyl (B): <10 mg/dL (ref <10)

## 2022-04-21 LAB — DIFFERENTIAL
Abs Immature Granulocytes: 0.03 10*3/uL (ref 0.00–0.07)
Basophils Absolute: 0.1 10*3/uL (ref 0.0–0.1)
Basophils Relative: 1 %
Eosinophils Absolute: 0.5 10*3/uL (ref 0.0–0.5)
Eosinophils Relative: 6 %
Immature Granulocytes: 0 %
Lymphocytes Relative: 22 %
Lymphs Abs: 1.9 10*3/uL (ref 0.7–4.0)
Monocytes Absolute: 0.9 10*3/uL (ref 0.1–1.0)
Monocytes Relative: 11 %
Neutro Abs: 5.3 10*3/uL (ref 1.7–7.7)
Neutrophils Relative %: 60 %

## 2022-04-21 LAB — RESP PANEL BY RT-PCR (FLU A&B, COVID) ARPGX2
Influenza A by PCR: NEGATIVE
Influenza B by PCR: NEGATIVE
SARS Coronavirus 2 by RT PCR: NEGATIVE

## 2022-04-21 LAB — URINALYSIS, ROUTINE W REFLEX MICROSCOPIC
Bilirubin Urine: NEGATIVE
Glucose, UA: NEGATIVE mg/dL
Hgb urine dipstick: NEGATIVE
Ketones, ur: NEGATIVE mg/dL
Leukocytes,Ua: NEGATIVE
Nitrite: NEGATIVE
Protein, ur: NEGATIVE mg/dL
Specific Gravity, Urine: >1.046 — ABNORMAL HIGH (ref 1.005–1.030)
pH: 5 (ref 5.0–8.0)

## 2022-04-21 LAB — APTT: aPTT: 29 seconds (ref 24–36)

## 2022-04-21 LAB — PROTIME-INR
INR: 1.1 (ref 0.8–1.2)
Prothrombin Time: 13.7 seconds (ref 11.4–15.2)

## 2022-04-21 MED ORDER — CLOPIDOGREL BISULFATE 75 MG PO TABS
75.0000 mg | ORAL_TABLET | Freq: Every day | ORAL | Status: DC
  Administered 2022-04-21 – 2022-05-01 (×8): 75 mg via ORAL
  Filled 2022-04-21 (×10): qty 1

## 2022-04-21 MED ORDER — ACETAMINOPHEN 325 MG PO TABS: 650.0000 mg | ORAL_TABLET | ORAL | Status: DC | PRN

## 2022-04-21 MED ORDER — SENNOSIDES-DOCUSATE SODIUM 8.6-50 MG PO TABS: 1.0000 | ORAL_TABLET | Freq: Every evening | ORAL | Status: DC | PRN

## 2022-04-21 MED ORDER — INSULIN ASPART 100 UNIT/ML IJ SOLN
0.0000 [IU] | Freq: Three times a day (TID) | INTRAMUSCULAR | Status: DC
  Administered 2022-04-22: 1 [IU] via SUBCUTANEOUS
  Administered 2022-04-25: 2 [IU] via SUBCUTANEOUS
  Administered 2022-04-25: 1 [IU] via SUBCUTANEOUS
  Administered 2022-04-27: 2 [IU] via SUBCUTANEOUS
  Administered 2022-04-27 – 2022-05-01 (×7): 1 [IU] via SUBCUTANEOUS

## 2022-04-21 MED ORDER — ACETAMINOPHEN 160 MG/5ML PO SOLN: 650.0000 mg | ORAL | Status: DC | PRN

## 2022-04-21 MED ORDER — FAMOTIDINE 20 MG PO TABS
40.0000 mg | ORAL_TABLET | ORAL | Status: DC
  Administered 2022-04-21 – 2022-05-01 (×5): 40 mg via ORAL
  Filled 2022-04-21 (×7): qty 2

## 2022-04-21 MED ORDER — ROSUVASTATIN CALCIUM 20 MG PO TABS
40.0000 mg | ORAL_TABLET | Freq: Every day | ORAL | Status: DC
  Administered 2022-04-21 – 2022-05-01 (×10): 40 mg via ORAL
  Filled 2022-04-21 (×10): qty 2

## 2022-04-21 MED ORDER — STROKE: EARLY STAGES OF RECOVERY BOOK
Freq: Once | Status: AC
  Filled 2022-04-21: qty 1

## 2022-04-21 MED ORDER — FERROUS SULFATE 325 (65 FE) MG PO TABS
325.0000 mg | ORAL_TABLET | Freq: Every day | ORAL | Status: DC
  Administered 2022-04-25 – 2022-05-01 (×6): 325 mg via ORAL
  Filled 2022-04-21 (×6): qty 1

## 2022-04-21 MED ORDER — ACETAMINOPHEN 650 MG RE SUPP
650.0000 mg | RECTAL | Status: DC | PRN
  Filled 2022-04-21: qty 1

## 2022-04-21 MED ORDER — ASPIRIN 300 MG RE SUPP: 300.0000 mg | Freq: Every day | RECTAL | Status: DC

## 2022-04-21 MED ORDER — IOHEXOL 350 MG/ML SOLN
75.0000 mL | Freq: Once | INTRAVENOUS | Status: AC | PRN
  Administered 2022-04-21: 75 mL via INTRAVENOUS

## 2022-04-21 MED ORDER — ASPIRIN 81 MG PO TBEC
81.0000 mg | DELAYED_RELEASE_TABLET | Freq: Every day | ORAL | Status: DC
  Filled 2022-04-21: qty 1

## 2022-04-21 MED ORDER — PANTOPRAZOLE SODIUM 20 MG PO TBEC
20.0000 mg | DELAYED_RELEASE_TABLET | ORAL | Status: DC
  Administered 2022-04-21 – 2022-05-01 (×5): 20 mg via ORAL
  Filled 2022-04-21 (×7): qty 1

## 2022-04-21 MED ORDER — ASPIRIN 81 MG PO CHEW
324.0000 mg | CHEWABLE_TABLET | Freq: Once | ORAL | Status: AC
  Administered 2022-04-21: 324 mg via ORAL
  Filled 2022-04-21: qty 4

## 2022-04-21 NOTE — ED Triage Notes (Signed)
Patient brought in by family for worsening altered mental status, LKW 0900 this morning. No facial droop, no dysarthria, follows some commands, no arm drift, answers "I don't know to most questions". Patient on plavix.

## 2022-04-21 NOTE — ED Provider Notes (Signed)
[facility] EMERGENCY DEPARTMENT Provider Note   CSN: [id number] Arrival date & time: 04/21/22  1254     History  Chief Complaint  Patient presents with   Code Stroke    Damon Walker is a 76 y.o. male.  HPI     Pt comes in with chief complaint of difficulty with speech. Patient accompanied by his wife and also his sister.  Level 5 caveat for aphasia.  Nursing staff alerted me in triage to come and assess the patient.  Patient has history of stroke, CAD, CHF, hyperlipidemia and diabetes.  According to the patient's family, they were watching movie at 1 AM and patient was fine.  Patient woke up at 4:00 in the morning, which is normal for him.  However family did not engage with him until later in the day at around 9:00, when they noted that his speech was not clear.  Ultimately they decided to bring him to the ER.  Patient unable to articulate appropriately.  Home Medications Prior to Admission medications   Medication Sig Start Date End Date Taking? Authorizing Provider  carvedilol (COREG) 12.5 MG tablet Take 1 tablet (12.5 mg total) by mouth 2 (two) times daily. 08/08/21 11/06/21  Leonie Man, MD  clopidogrel (PLAVIX) 75 MG tablet Take 1 tablet (75 mg total) by mouth daily. 05/26/15   Love, Ivan Anchors, PA-C  ferrous sulfate 325 (65 FE) MG tablet Take 325 mg by mouth daily with breakfast.    [provider]  furosemide (LASIX) 20 MG tablet Take 1 tablet (20 mg total) by mouth daily as needed. 08/08/21   Leonie Man, MD  losartan (COZAAR) 25 MG tablet TAKE 1 TABLET BY MOUTH ONCE DAILY AS NEEDED 08/08/21   Leonie Man, MD  nitroGLYCERIN (NITROSTAT) 0.4 MG SL tablet Place 1 tablet (0.4 mg total) under the tongue every 5 (five) minutes x 3 doses as needed for chest pain. 05/14/15   Almyra Deforest, PA  pantoprazole (PROTONIX) 20 MG tablet Take 20 mg by mouth daily. As of 04/19/2020 TAKES 1 TABLET (20 MG) ONCE EVERY OTHER DAY TO ALTERNATE WITH FAMOTIDINE     [provider]  rosuvastatin (CRESTOR) 40 MG tablet Take 1 tablet (40 mg total) by mouth daily at 6 PM. 05/14/15   Almyra Deforest, PA      Allergies    Latex    Review of Systems   Review of Systems  Physical Exam Updated Vital Signs BP (!) 143/61   Pulse 70   Temp 98.7 F (37.1 C) (Oral)   Resp (!) 24   SpO2 94%  Physical Exam Vitals and nursing note reviewed.  Constitutional:      Appearance: He is well-developed.  HENT:     Head: Atraumatic.  Eyes:     Extraocular Movements: Extraocular movements intact.     Pupils: Pupils are equal, round, and reactive to light.  Cardiovascular:     Rate and Rhythm: Normal rate.  Pulmonary:     Effort: Pulmonary effort is normal.  Musculoskeletal:     Cervical back: Neck supple.  Skin:    General: Skin is warm.  Neurological:     Mental Status: He is alert and oriented to person, place, and time.     Comments: Follow simple commands, upper and lower extremity strength is 4+ out of 5, no upper extremity drift, no nystagmus, patient able to follow simple commands. Patient unable to repeat words Patient unable to identify simple objects  Slight facial droop right-sided     ED Results / Procedures / Treatments   Labs (all labs ordered are listed, but only abnormal results are displayed) Labs Reviewed  COMPREHENSIVE METABOLIC PANEL - Abnormal; Notable for the following components:      Result Value   Creatinine, Ser 1.30 (*)    GFR, Estimated 56 (*)    All other components within normal limits  I-STAT CHEM 8, ED - Abnormal; Notable for the following components:   Calcium, Ion 1.12 (*)    All other components within normal limits  RESP PANEL BY RT-PCR (FLU A&B, COVID) ARPGX2  PROTIME-INR  APTT  CBC  DIFFERENTIAL  ETHANOL  RAPID URINE DRUG SCREEN, HOSP PERFORMED  URINALYSIS, ROUTINE W REFLEX MICROSCOPIC    EKG EKG Interpretation  Date/Time:  Sunday April 21 2022 13:58:39 EDT Ventricular Rate:  71 PR  Interval:  215 QRS Duration: 90 QT Interval:  415 QTC Calculation: 451 R Axis:   -4 Text Interpretation: Sinus rhythm Borderline prolonged PR interval Anterior infarct, old Nonspecific repol abnormality, diffuse leads No acute changes TWI in the lateral leads Confirmed by Varney Biles [phone]) on 04/21/2022 2:59:58 PM  Radiology CT ANGIO HEAD NECK W WO CM W PERF (CODE STROKE)  Result Date: 04/21/2022 CLINICAL DATA:  Acute neuro deficit. EXAM: CT ANGIOGRAPHY HEAD AND NECK CT PERFUSION BRAIN TECHNIQUE: Multidetector CT imaging of the head and neck was performed using the standard protocol during bolus administration of intravenous contrast. Multiplanar CT image reconstructions and MIPs were obtained to evaluate the vascular anatomy. Carotid stenosis measurements (when applicable) are obtained utilizing NASCET criteria, using the distal internal carotid diameter as the denominator. Multiphase CT imaging of the brain was performed following IV bolus contrast injection. Subsequent parametric perfusion maps were calculated using RAPID software. RADIATION DOSE REDUCTION: This exam was performed according to the departmental dose-optimization program which includes automated exposure control, adjustment of the mA and/or kV according to patient size and/or use of iterative reconstruction technique. CONTRAST:  93m OMNIPAQUE IOHEXOL 350 MG/ML SOLN COMPARISON:  CT head 04/21/2022 FINDINGS: CTA NECK FINDINGS Aortic arch: Atherosclerotic calcification aortic arch. Atherosclerotic disease in the proximal great vessels without significant stenosis Right carotid system: Extensive atherosclerotic disease throughout the right common carotid artery and right internal carotid artery. No significant stenosis. Left carotid system: Atherosclerotic disease throughout the left common carotid artery and left internal carotid artery. 50% diameter stenosis proximal left internal carotid artery. Vertebral arteries: Left vertebral  artery dominant. Atherosclerotic calcification proximally without significant stenosis. Mild atherosclerotic calcification distal left vertebral artery. Diminutive right vertebral artery which is probably diffusely disease with minimal flow. Skeleton: No acute skeletal abnormality.  Cervical spondylosis. Other neck: Negative for mass or adenopathy in the neck. Upper chest: Lung apices clear bilaterally Review of the MIP images confirms the above findings CTA HEAD FINDINGS Anterior circulation: Atherosclerotic calcification and mild stenosis in the cavernous carotid bilaterally. Anterior and middle cerebral arteries patent bilaterally without large vessel occlusion. Mild atherosclerotic irregularity in the middle cerebral arteries bilaterally. Negative for aneurysm. Posterior circulation: Left vertebral artery is widely patent to the basilar. Diminutive right vertebral artery has mild contribution to the basilar. Basilar patent. Superior cerebellar and posterior cerebral arteries patent bilaterally without stenosis. No aneurysm. Venous sinuses: Normal venous enhancement Anatomic variants: None Review of the MIP images confirms the above findings CT Brain Perfusion Findings: ASPECTS: 10 CBF (<30%) Volume: 39ML Perfusion (Tmax>6.0s) volume: 33ML Mismatch Volume: -654mInfarction Location:Small core infarct right anterolateral temporal lobe Small  core infarct and decreased perfusion in the left parietal lobe. IMPRESSION: 1. CT perfusion is abnormal bilaterally. This involves the right temporal lobe and left parietal lobe. Recommend MRI to confirm evidence of acute infarction. 2. Negative for intracranial large vessel occlusion 3. Extensive atherosclerotic disease in the carotid bilaterally. 50% diameter stenosis proximal left internal carotid artery 4. Left vertebral artery widely patent. Diminutive right vertebral artery which appears diffusely diseased and has minimal contribution to the basilar. Electronically Signed    By: Franchot Gallo M.D.   On: 04/21/2022 14:11   CT HEAD WO CONTRAST  Result Date: 04/21/2022 CLINICAL DATA:  Acute neuro deficit rule out stroke. EXAM: CT HEAD WITHOUT CONTRAST TECHNIQUE: Contiguous axial images were obtained from the base of the skull through the vertex without intravenous contrast. RADIATION DOSE REDUCTION: This exam was performed according to the departmental dose-optimization program which includes automated exposure control, adjustment of the mA and/or kV according to patient size and/or use of iterative reconstruction technique. COMPARISON:  MRI head 05/15/2015 FINDINGS: Brain: Negative for acute infarct, hemorrhage, mass Mild atrophy. Negative for hydrocephalus. Chronic infarct left occipital lobe and right parietal lobe. CSF density fluid anterior to the right temporal lobe measuring 24 x 41 mm unchanged from the prior study. Based on the MRI, this fluid may extend lateral to the right temporal lobe where there is prominent CSF similar to the prior study. 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 No midline shift. Vascular: Negative for hyperdense vessel Skull: Negative Sinuses/Orbits: Paranasal sinuses clear. Bilateral mastoid effusion. Bilateral middle ear effusion. Chronic mastoiditis bilaterally. Other: None IMPRESSION: No acute intracranial abnormality no change from the prior MRI. Bilateral mastoid and middle ear effusion Electronically Signed   By: Franchot Gallo M.D.   On: 04/21/2022 13:46    Procedures .Critical Care  Performed by: Varney Biles, MD Authorized by: Varney Biles, MD   Critical care provider statement:    Critical care time (minutes):  32   Critical care was time spent personally by me on the following activities:  Development of treatment plan with patient or surrogate, discussions with consultants, evaluation of patient's response to treatment, examination of patient, ordering and review of laboratory studies, ordering and review of radiographic studies, ordering  and performing treatments and interventions, pulse oximetry, re-evaluation of patient's condition and review of old charts     Medications Ordered in ED Medications  iohexol (OMNIPAQUE) 350 MG/ML injection 75 mL (75 mLs Intravenous Contrast Given 04/21/22 1353)    ED Course/ Medical Decision Making/ A&P                           Medical Decision Making Amount and/or Complexity of Data Reviewed Labs: ordered. Radiology: ordered.  Risk Decision regarding hospitalization.   This patient presents to the ED with chief complaint(s) of speech disturbance with pertinent past medical history of coronary artery disease, carotid artery disease, MI, stroke, metabolic syndrome.The complaint involves an extensive differential diagnosis and also carries with it a high risk of complications and morbidity.    Patient last known normal at 1 AM, when family had watched a movie. It seems like he woke up at 4 AM, morning and felt that he was having speech disturbance.  Clinically, patient is having expressive aphasia.  Concerns for stroke, LVO screen is negative.   Other possibilities considered include brain bleed, postictal state, electrolyte abnormality.  The initial plan is to initiate stroke work-up.  I will put an emergent call with  neurology and I already contacted CT team to take patient back to the CAT scan for stat CT.   Additional history obtained: Additional history obtained from family and spouse Records reviewed  prior visits with cardiology service and previous cardiovascular work-up, including 1 for carotid artery disease earlier this year.  Independent labs interpretation:  The following labs were independently interpreted: Patient's creatinine is normal.  Independent visualization and interpretation of imaging: - I independently visualized the following imaging with scope of interpretation limited to determining acute life threatening conditions related to emergency care: CT scan  of the brain, which revealed no evidence of brain bleed  Treatment and Reassessment: I spoke with Dr. Quinn Axe, neurology immediately after seeing the patient. We discussed patient's presentation, LVO screen being negative, however patient possibly having a high-grade stroke.  She requested that we activate code stroke and she has ordered CT angiogram plus CT perfusion study.  She will see the patient immediately in trauma A.  Family made aware of this plan as they were stepping out to parked her car.  Consultation: - Consulted or discussed management/test interpretation with external professional: Neurology service.  Dr. Quinn Axe has assessed the patient and reviewed the CT scan.  Patient is not a candidate for TNK.  He clearly does have a stroke, they recommend patient be admitted to hospital for further stroke management.  No TNK, not a candidate for thrombectomy as there is no LVO.   Final Clinical Impression(s) / ED Diagnoses Final diagnoses:  Acute ischemic stroke [facility])  Aphasia    Rx / DC Orders ED Discharge Orders     None         Varney Biles, MD 04/21/22 1513

## 2022-04-21 NOTE — Assessment & Plan Note (Signed)
No recent A1C, pending. Appears to be diet controlled.  SSI and accuchecks qac/hs

## 2022-04-21 NOTE — H&P (Signed)
History and Physical    Patient: Damon Walker GBT:[id number] DOB: 1946/06/30 DOA: 04/21/2022 DOS: the patient was seen and examined on 04/21/2022 PCP: [name], MD  Patient coming from: Home - lives with his wife. Ambulates independently    Chief Complaint: difficulty with speech   HPI: Damon Walker is a 76 y.o. male with medical history significant of carotid artery disease, hx of CVA, combined systolic and diastolic CHF, CAD, HTN, HLD, T2DM, CKD stage 3 who presented to ED with complaints of difficulty with speech and some confusion.  He woke up around 4am and didn't really interact with family. Around 10am he was  having some speech difficulties and not acting himself and so they brought  him in. Family states he couldn't form words and speech was mumbled. He also seemed more confused. Did not notice any focal deficits or weakness. He was able to walk to the car.   Family denies any recent illness, but he has had a productive cough after a trip to the beach. NO fever/chills, no complaints of h/a, chest pain, shortness of breath, abdominal pain, N/V/D or leg swelling per family.   Does not smoke or drink alcohol.   ER Course:  vitals: afebrile, bp: 163/78, HR: 72, RR: 16, oxygen: 97%RA Pertinent labs: creatinine: 1.30 CT head: no acute finding or change from previous MRI CTA head/neck: CT perfusion is abnormal bilaterally. This involves the right temporal lobe and left parietal lobe. Recommend MRI to confirm evidence of acute infarction. Negative for intracranial large vessel occlusion. Extensive atherosclerotic disease in the carotid bilaterally. 50% diameter stenosis proximal left internal carotid artery In ED: code stroke called. Neurology following. Given ASA/plavix. TRH asked to admit.    Review of Systems: unable to review all systems due to the inability of the patient to answer questions. Past Medical History:  Diagnosis Date   Carotid arterial disease ([place]) 06/2014;  01/09/2015   Followed by Dr. Donnetta Hutching of [facility] -- a) Carotid u/s: 40-59% bilat ICA stenosis; b) 10/2019: Carotid Dopplers October 19, 7104: R ICA 2-69%, LICA 48-54%.  Right vertebral artery appears occluded.  Normal subclavian flow.  Normal left vertebral artery. -->  No change since 2020   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 ([place]) 05/14/2015   -- Given TPA.  MRI Brain: Acute L Occipital Lobe Infarct. Chronic microvascular ischemic changes in the white matter and left pons.  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   Chronic combined systolic and diastolic congestive heart failure, NYHA class 2 ([place]) 05/11/2015 - 05/15/15   a. EF 20-25% with anterior-anteroseptal akinesis (immediately post anterior STEMI).  ; b. 10/10/'16 Echo: EF 35-40% with moderate mid-apical anteroseptal, anterior and apical hypokinesis.   Coronary artery disease involving native heart 05/11/2015   Cath: 3V CAD with 90% prox RCA, 80% mid RCA, 80% OM3, (RCA and OM residual treated medically) 100% LAD - PCI to Prox-Mid LAD with Overlapping Promus Premier DES 3.0 x 38 & 3.0 x 16 (post-dilated to 3.5 mm)    Essential hypertension    H/O: GI bleed 05/15/2015   s/p Sigmoid Polypectomy 05/04/2015 - prior to STEMI on May 11, 2015; Had GI Bleed following TPA for CVA, while on ASA & Brilinta; Flex Sig - 1. Internal Hemorrhoids, 2. Distal sigmoid polypectomy site with flat [place] status post biopsy and tattoo with 30m spot over 3  injections 3. Proximal sigmoid polypectomy site seen as well, 4) otherwise normal with no evidence of bleed   Hyperlipidemia with target LDL less than 70    Ischemic cardiomyopathy 05/11/2015   EF 40% on cath 05/11/2015 after anterior STEMI, EF 20-25% on echo 05/13/2015; 12/2015 =  EF ~45%.   Liver disease, chronic, with cirrhosis ([place])  11/2019   Mild possible early cirrhotic liver disease noted on CT scan.   Prediabetes Oct 2016   A1C 6.0 in Oct 2016   ST elevation myocardial infarction (STEMI) involving left anterior descending (LAD) coronary artery with complication ([place]) 86/12/7844   100% Prox LAD - PCI with overlapping DES x 2   Tobacco abuse    a. 30 yrs - 1.5 ppd. - Quit 05/11/2015   Type 2 diabetes mellitus with other specified complication ([place]) 9/62/9528   Vision abnormalities    Right hemianopsia   Past Surgical History:  Procedure Laterality Date   CARDIAC CATHETERIZATION N/A 05/11/2015   Procedure: Left Heart Cath and Coronary Angiography;  Surgeon: Peter M Martinique, MD;  Location: [facility];  Service: Cardiovascular;  100% pLAD (long lesion). RCA - prox 90%, mid 80%. OM2 & OM3 80% (OM2 & 3 not necessarily PCI targets); EF 35-45% with Anterio HK   CARDIAC CATHETERIZATION N/A 05/11/2015   Procedure: Coronary Stent Intervention;  Surgeon: Peter M Martinique, MD;  Location: [facility];  Service: Cardiovascular; PCI to Prox-Mid LAD with Overlapping Promus Premier DES 3.0 x 38 & 3.0 x 16 (post-dilated to 3.5 mm)     FLEXIBLE SIGMOIDOSCOPY N/A 05/18/2015   Procedure: FLEXIBLE SIGMOIDOSCOPY;  Surgeon: Clarene Essex, MD;  Location: [facility];  Service: Endoscopy;  Laterality: N/A;   NM MYOVIEW LTD  08/2016   EF 30% with large anterior-anteroseptal and apical infarct consistent with LAD infarct. His HIGH RISK because of large infarct. No ischemia.   S/P Appendectomy     Age 20   S/P Inguinal Hernia Repair     In his 20's.   TRANSTHORACIC ECHOCARDIOGRAM  05/15/2015   Mild LVH, EF 35-40% - Mod HK of mid-apical anteroseptal, anterior & apical walls.  Gr 1 DD. Mild bilateral Atrial Enlargement.   TRANSTHORACIC ECHOCARDIOGRAM  12/2015   EF up to ~40-45% wiht Anterior-Anteroapical HK.  Mod LV dilation with increased LVEDP.   TRANSTHORACIC ECHOCARDIOGRAM  12/'17; 4/'18   a) EF ~30-35% (in setting of HTN Urgency).  - Gr 2 DD;; b) EF 30-35% (per Dr. Sallyanne Kuster - 35-40%). Anterior-anteroapical Akinesis. Gr2 DD w/ high filling pressures. Mild LA dilation. Mild RV dilation.   Social History:  reports that he quit smoking about 7 years ago. His smoking use included cigarettes. He has a 30.00 pack-year smoking history. He has been exposed to tobacco smoke. He has never used smokeless tobacco. He reports that he does not drink alcohol and does not use drugs.  Allergies  Allergen Reactions   Latex Hives, Itching, Rash and Other (See Comments)    Blisters    Family History  Adopted: Yes  Problem Relation Age of Onset   Other Mother        eczema    Cancer Mother    Other Other        Adopted - unaware of biological parent's histories.    Prior to Admission medications   Medication Sig Start Date End Date Taking? Authorizing Provider  carvedilol (COREG) 12.5 MG tablet Take 1 tablet (12.5 mg total) by mouth 2 (two) times daily.  08/08/21 11/06/21  Leonie Man, MD  clopidogrel (PLAVIX) 75 MG tablet Take 1 tablet (75 mg total) by mouth daily. 05/26/15   Love, Ivan Anchors, PA-C  ferrous sulfate 325 (65 FE) MG tablet Take 325 mg by mouth daily with breakfast.    [provider]  furosemide (LASIX) 20 MG tablet Take 1 tablet (20 mg total) by mouth daily as needed. 08/08/21   Leonie Man, MD  losartan (COZAAR) 25 MG tablet TAKE 1 TABLET BY MOUTH ONCE DAILY AS NEEDED 08/08/21   Leonie Man, MD  nitroGLYCERIN (NITROSTAT) 0.4 MG SL tablet Place 1 tablet (0.4 mg total) under the tongue every 5 (five) minutes x 3 doses as needed for chest pain. 05/14/15   Almyra Deforest, PA  pantoprazole (PROTONIX) 20 MG tablet Take 20 mg by mouth daily. As of 04/19/2020 TAKES 1 TABLET (20 MG) ONCE EVERY OTHER DAY TO ALTERNATE WITH FAMOTIDINE    [provider]  rosuvastatin (CRESTOR) 40 MG tablet Take 1 tablet (40 mg total) by mouth daily at 6 PM. 05/14/15   Almyra Deforest, [place]    Physical Exam: Vitals:   04/21/22 1400  04/21/22 1445 04/21/22 1615 04/21/22 1724  BP: 133/67 (!) 143/61 (!) 133/114   Pulse: 71 70 71   Resp: (!) 26 (!) 24 16   Temp:    99 F (37.2 C)  TempSrc:    Oral  SpO2: 94% 94% 92%    General:  Appears calm and comfortable and is in NAD Eyes:  PERRL, EOMI, normal lids, iris ENT:  HOH, lips & tongue, mmm; no teeth Neck:  no LAD, masses or thyromegaly; no carotid bruits Cardiovascular:  RRR, no m/r/g. No LE edema.  Respiratory:   CTA bilaterally with no wheezes/rales/rhonchi.  Normal respiratory effort. Abdomen:  soft, NT, ND, NABS Back:   normal alignment, no CVAT Skin:  no rash or induration seen on limited exam Musculoskeletal:  grossly normal tone BUE/BLE, good ROM, no bony abnormality Lower extremity:  No LE edema.  Limited foot exam with no ulcerations.  2+ distal pulses. Psychiatric:  limited speech and dysarthric. Able to name glasses correctly. Unable to determine if oriented.  Neurologic:  CN 2-12 grossly intact, moves all extremities in coordinated fashion, sensation intact. HTK intact bilaterally. FTN intact bilaterally. Gait deferred. DTR 2+.    Radiological Exams on Admission: Independently reviewed - see discussion in A/P where applicable  DG Abd 1 View  Result Date: 04/21/2022 CLINICAL DATA:  Metal screening prior to MRI. EXAM: ABDOMEN - 1 VIEW COMPARISON:  05/23/2015. FINDINGS: Normal bowel gas pattern. Residual contrast noted within nondilated intrarenal collecting systems and ureters, as well as the bladder. No radiopaque foreign body. Skeletal structures are unremarkable. IMPRESSION: 1. No radiopaque/metallic foreign body.  No contraindication to MRI. Electronically Signed   By: Lajean Manes M.D.   On: 04/21/2022 17:19   DG CHEST PORT 1 VIEW  Result Date: 04/21/2022 CLINICAL DATA:  Neurologic deficit EXAM: PORTABLE CHEST 1 VIEW COMPARISON:  07/08/2016 FINDINGS: Two frontal views of the chest demonstrate a stable cardiac silhouette. Chronic interstitial opacities  are seen throughout the lungs consistent with scarring. No acute airspace disease, effusion, or pneumothorax. No acute bony abnormalities. IMPRESSION: 1. No acute intrathoracic process. Electronically Signed   By: Randa Ngo M.D.   On: 04/21/2022 16:23   CT ANGIO HEAD NECK W WO CM W PERF (CODE STROKE)  Result Date: 04/21/2022 CLINICAL DATA:  Acute neuro deficit. EXAM: CT ANGIOGRAPHY HEAD  AND NECK CT PERFUSION BRAIN TECHNIQUE: Multidetector CT imaging of the head and neck was performed using the standard protocol during bolus administration of intravenous contrast. Multiplanar CT image reconstructions and MIPs were obtained to evaluate the vascular anatomy. Carotid stenosis measurements (when applicable) are obtained utilizing NASCET criteria, using the distal internal carotid diameter as the denominator. Multiphase CT imaging of the brain was performed following IV bolus contrast injection. Subsequent parametric perfusion maps were calculated using RAPID software. RADIATION DOSE REDUCTION: This exam was performed according to the departmental dose-optimization program which includes automated exposure control, adjustment of the mA and/or kV according to patient size and/or use of iterative reconstruction technique. CONTRAST:  73m OMNIPAQUE IOHEXOL 350 MG/ML SOLN COMPARISON:  CT head 04/21/2022 FINDINGS: CTA NECK FINDINGS Aortic arch: Atherosclerotic calcification aortic arch. Atherosclerotic disease in the proximal great vessels without significant stenosis Right carotid system: Extensive atherosclerotic disease throughout the right common carotid artery and right internal carotid artery. No significant stenosis. Left carotid system: Atherosclerotic disease throughout the left common carotid artery and left internal carotid artery. 50% diameter stenosis proximal left internal carotid artery. Vertebral arteries: Left vertebral artery dominant. Atherosclerotic calcification proximally without significant  stenosis. Mild atherosclerotic calcification distal left vertebral artery. Diminutive right vertebral artery which is probably diffusely disease with minimal flow. Skeleton: No acute skeletal abnormality.  Cervical spondylosis. Other neck: Negative for mass or adenopathy in the neck. Upper chest: Lung apices clear bilaterally Review of the MIP images confirms the above findings CTA HEAD FINDINGS Anterior circulation: Atherosclerotic calcification and mild stenosis in the cavernous carotid bilaterally. Anterior and middle cerebral arteries patent bilaterally without large vessel occlusion. Mild atherosclerotic irregularity in the middle cerebral arteries bilaterally. Negative for aneurysm. Posterior circulation: Left vertebral artery is widely patent to the basilar. Diminutive right vertebral artery has mild contribution to the basilar. Basilar patent. Superior cerebellar and posterior cerebral arteries patent bilaterally without stenosis. No aneurysm. Venous sinuses: Normal venous enhancement Anatomic variants: None Review of the MIP images confirms the above findings CT Brain Perfusion Findings: ASPECTS: 10 CBF (<30%) Volume: 39ML Perfusion (Tmax>6.0s) volume: 33ML Mismatch Volume: -633mInfarction Location:Small core infarct right anterolateral temporal lobe Small core infarct and decreased perfusion in the left parietal lobe. IMPRESSION: 1. CT perfusion is abnormal bilaterally. This involves the right temporal lobe and left parietal lobe. Recommend MRI to confirm evidence of acute infarction. 2. Negative for intracranial large vessel occlusion 3. Extensive atherosclerotic disease in the carotid bilaterally. 50% diameter stenosis proximal left internal carotid artery 4. Left vertebral artery widely patent. Diminutive right vertebral artery which appears diffusely diseased and has minimal contribution to the basilar. Electronically Signed   By: ChFranchot Gallo.D.   On: 04/21/2022 14:11   CT HEAD WO  CONTRAST  Result Date: 04/21/2022 CLINICAL DATA:  Acute neuro deficit rule out stroke. EXAM: CT HEAD WITHOUT CONTRAST TECHNIQUE: Contiguous axial images were obtained from the base of the skull through the vertex without intravenous contrast. RADIATION DOSE REDUCTION: This exam was performed according to the departmental dose-optimization program which includes automated exposure control, adjustment of the mA and/or kV according to patient size and/or use of iterative reconstruction technique. COMPARISON:  MRI head 05/15/2015 FINDINGS: Brain: Negative for acute infarct, hemorrhage, mass Mild atrophy. Negative for hydrocephalus. Chronic infarct left occipital lobe and right parietal lobe. CSF density fluid anterior to the right temporal lobe measuring 24 x 41 mm unchanged from the prior study. Based on the MRI, this fluid may extend lateral to the right  temporal lobe where there is prominent CSF similar to the prior study. 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 No midline shift. Vascular: Negative for hyperdense vessel Skull: Negative Sinuses/Orbits: Paranasal sinuses clear. Bilateral mastoid effusion. Bilateral middle ear effusion. Chronic mastoiditis bilaterally. Other: None IMPRESSION: No acute intracranial abnormality no change from the prior MRI. Bilateral mastoid and middle ear effusion Electronically Signed   By: Franchot Gallo M.D.   On: 04/21/2022 13:46    EKG: Independently reviewed.  NSR with rate 71; nonspecific ST changes with no evidence of acute ischemia   Labs on Admission: I have personally reviewed the available labs and imaging studies at the time of the admission.  Pertinent labs:   Creatinine: 1.30   Assessment and Plan: Principal Problem:   Acute CVA (cerebrovascular accident) ([place]) Active Problems:   CAD-native artery   Essential hypertension   Hyperlipidemia with target LDL less than 70   Carotid arterial disease ([place])   CKD (chronic kidney disease), stage III ([place])   Type 2 diabetes  mellitus with other specified complication (H[facility])   Combined systolic and diastolic heart failure ([facility])    Assessment and Plan: * Acute CVA (cerebrovascular accident) ([place]) 76 year old presenting with acute onset of dysarthria and some confusion with hx of CVA, CAD, T2DM, carotid stenosis, HTN, HLD  -place in observation on telemetry for TIA/stroke work-up -Neurochecks per protocol -Neurology consulted -MRI brain without contrast  -echo -a1c/lipid panel, continue crestor  -Continue plavix, ASA added per neurology  -Permissive hypertension first 24 hours <220/110 -N.p.o. until bedside swallow screen -PT/ OT/ SLP consult   CAD-native artery Hx of STEMI with drug eluding stent No active angina.  Significant underlying disease. Continue statin and beta blocker after permissive HTN Continue plavix   Combined systolic and diastolic heart failure ([facility]) Euvolemic Strict I/O Last echo in 2018 with EF of 30-35%. Grade 2 DD Blood pressure limited medical management. Can not tolerate entresto, ARB now PRN. Lasix PRN Continue beta blocker   Type 2 diabetes mellitus with other specified complication ([facility]) No recent A1C, pending. Appears to be diet controlled.  SSI and accuchecks qac/hs   CKD (chronic kidney disease), stage III ([place]) At baseline, continue to monitor   Carotid arterial disease ([place]) Followed by vascular surgery Last carotid US in 10/2021: right ICA 1-39% stenosis, left ICA: 40-59% stenosis  -continue plavix and statin   Hyperlipidemia with target LDL less than 70 Continue crestor Lipid panel pending, goal LDL <70  Essential hypertension Allow for permissive HTN in first 24-48 hours Hold home medication IV parameters for >220/110    Advance Care Planning:   Code Status: Full Code   Consults: neurology: Dr. Quinn Axe   DVT Prophylaxis: SCDs  Family Communication: wife and daughter at bedside.   Severity of Illness: The appropriate patient status for this  patient is OBSERVATION. Observation status is judged to be reasonable and necessary in order to provide the required intensity of service to ensure the patient's safety. The patient's presenting symptoms, physical exam findings, and initial radiographic and laboratory data in the context of their medical condition is felt to place them at decreased risk for further clinical deterioration. Furthermore, it is anticipated that the patient will be medically stable for discharge from the hospital within 2 midnights of admission.   Author: Orma Flaming, MD 04/21/2022 6:27 PM  For on call review [email or web address].

## 2022-04-21 NOTE — ED Notes (Signed)
ED TO INPATIENT HANDOFF REPORT  ED Nurse Name and Phone #: [phone]  S Name/Age/Gender Damon Walker 76 y.o. male Room/Bed: TRAAC/TRAAC  Code Status   Code Status: Full Code  Home/SNF/Other Home Pt alert but is unable to speak and answer questions appropriately   Triage Complete: Triage complete  Chief Complaint Acute CVA (cerebrovascular accident) [facility]) [I63.9]  Triage Note Patient brought in by family for worsening altered mental status, LKW 0900 this morning. No facial droop, no dysarthria, follows some commands, no arm drift, answers "I don't know to most questions". Patient on plavix.   Allergies Allergies  Allergen Reactions   Latex Hives, Itching, Rash and Other (See Comments)    Blisters    Level of Care/Admitting Diagnosis ED Disposition     ED Disposition  Admit   Condition  --   Comment  Hospital Area: [facility] [[id number]]  Level of Care: Progressive [102]  Admit to Progressive based on following criteria: NEUROLOGICAL AND NEUROSURGICAL complex patients with significant risk of instability, who do not meet ICU criteria, yet require close observation or frequent assessment (< / = every 2 - 4 hours) with medical / nursing intervention.  May place patient in observation at [facility] or [place] if equivalent level of care is available:: No  Covid Evaluation: Asymptomatic - no recent exposure (last 10 days) testing not required  Diagnosis: Acute CVA (cerebrovascular accident) [facility]) [[id number]]  Admitting Physician: Orma Flaming [[id number]]  Attending Physician: Orma Flaming [[id number]]          B Medical/Surgery History Past Medical History:  Diagnosis Date   Carotid arterial disease ([place]) 06/2014; 01/09/2015   Followed by Dr. Donnetta Hutching of [facility] -- a) Carotid u/s: 40-59% bilat ICA stenosis; b) 10/2019: Carotid Dopplers October 20, 8655: R ICA 8-46%, LICA 96-29%.  Right vertebral artery appears occluded.  Normal subclavian flow.  Normal left  vertebral artery. 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  MRI Brain: Acute L Occipital Lobe Infarct. Chronic microvascular ischemic changes in the white matter and left pons.  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   Chronic combined systolic and diastolic congestive heart failure, NYHA class 2 ([place]) 05/11/2015 - 05/15/15   a. EF 20-25% with anterior-anteroseptal akinesis (immediately post anterior STEMI).  ; b. 10/10/'16 Echo: EF 35-40% with moderate mid-apical anteroseptal, anterior and apical hypokinesis.   Coronary artery disease involving native heart 05/11/2015   Cath: 3V CAD with 90% prox RCA, 80% mid RCA, 80% OM3, (RCA and OM residual treated medically) 100% LAD - PCI to Prox-Mid LAD with Overlapping Promus Premier DES 3.0 x 38 & 3.0 x 16 (post-dilated to 3.5 mm)    Essential hypertension    H/O: GI bleed 05/15/2015   s/p Sigmoid Polypectomy 05/04/2015 - prior to STEMI on May 11, 2015; Had GI Bleed following TPA for CVA, while on ASA & Brilinta; Flex Sig - 1. Internal Hemorrhoids, 2. Distal sigmoid polypectomy site with flat White[place] status post biopsy and tattoo with 40m spot over 3 injections 3. Proximal sigmoid polypectomy site seen as well, 4) otherwise normal with no evidence of bleed   Hyperlipidemia with target LDL less than 70    Ischemic cardiomyopathy 05/11/2015   EF 40% on cath 05/11/2015 after anterior STEMI, EF  20-25% on echo 05/13/2015; 12/2015 =  EF ~45%.   Liver disease, chronic, with cirrhosis ([place]) 11/2019   Mild possible early cirrhotic liver disease noted on CT scan.   Prediabetes Oct 2016   A1C 6.0 in Oct 2016   ST elevation myocardial infarction (STEMI) involving left anterior descending (LAD) coronary artery with complication  ([place]) 75/04/1637   100% Prox LAD - PCI with overlapping DES x 2   Tobacco abuse    a. 30 yrs - 1.5 ppd. - Quit 05/11/2015   Type 2 diabetes mellitus with other specified complication ([place]) 4/66/5993   Vision abnormalities    Right hemianopsia   Past Surgical History:  Procedure Laterality Date   CARDIAC CATHETERIZATION N/A 05/11/2015   Procedure: Left Heart Cath and Coronary Angiography;  Surgeon: Peter M Martinique, MD;  Location: [facility];  Service: Cardiovascular;  100% pLAD (long lesion). RCA - prox 90%, mid 80%. OM2 & OM3 80% (OM2 & 3 not necessarily PCI targets); EF 35-45% with Anterio HK   CARDIAC CATHETERIZATION N/A 05/11/2015   Procedure: Coronary Stent Intervention;  Surgeon: Peter M Martinique, MD;  Location: [facility];  Service: Cardiovascular; PCI to Prox-Mid LAD with Overlapping Promus Premier DES 3.0 x 38 & 3.0 x 16 (post-dilated to 3.5 mm)     FLEXIBLE SIGMOIDOSCOPY N/A 05/18/2015   Procedure: FLEXIBLE SIGMOIDOSCOPY;  Surgeon: Clarene Essex, MD;  Location: [facility];  Service: Endoscopy;  Laterality: N/A;   NM MYOVIEW LTD  08/2016   EF 30% with large anterior-anteroseptal and apical infarct consistent with LAD infarct. His HIGH RISK because of large infarct. No ischemia.   S/P Appendectomy     Age 3   S/P Inguinal Hernia Repair     In his 20's.   TRANSTHORACIC ECHOCARDIOGRAM  05/15/2015   Mild LVH, EF 35-40% - Mod HK of mid-apical anteroseptal, anterior & apical walls.  Gr 1 DD. Mild bilateral Atrial Enlargement.   TRANSTHORACIC ECHOCARDIOGRAM  12/2015   EF up to ~40-45% wiht Anterior-Anteroapical HK.  Mod LV dilation with increased LVEDP.   TRANSTHORACIC ECHOCARDIOGRAM  12/'17; 4/'18   a) EF ~30-35% (in setting of HTN Urgency). - Gr 2 DD;; b) EF 30-35% (per Dr. Sallyanne Kuster - 35-40%). Anterior-anteroapical Akinesis. Gr2 DD w/ high filling pressures. Mild LA dilation. Mild RV dilation.     A IV Location/Drains/Wounds Patient Lines/Drains/Airways Status     Active  Line/Drains/Airways     Name Placement date Placement time Site Days   Peripheral IV 12/02/19 Right;Anterior;Upper Arm 12/02/19  1642  Arm  871   Peripheral IV 04/21/22 20 G Left Antecubital 04/21/22  1320  Antecubital  less than 1            Intake/Output Last 24 hours No intake or output data in the 24 hours ending 04/21/22 1817  Labs/Imaging Results for orders placed or performed during the hospital encounter of 04/21/22 (from the past 48 hour(s))  Protime-INR     Status: None   Collection Time: 04/21/22  1:17 PM  Result Value Ref Range   Prothrombin Time 13.7 11.4 - 15.2 seconds   INR 1.1 0.8 - 1.2    Comment: (NOTE) INR goal varies based on device and disease states. Performed at [facility], [place] [address] [place], [place] [address]   APTT     Status: None   Collection Time: 04/21/22  1:17 PM  Result Value Ref Range   aPTT 29 24 - 36 seconds  Comment: Performed at [facility], [place] [address], [place] [address]  CBC     Status: None   Collection Time: 04/21/22  1:17 PM  Result Value Ref Range   WBC 8.8 4.0 - 10.5 K/uL   RBC 4.45 4.22 - 5.81 MIL/uL   Hemoglobin 13.6 13.0 - 17.0 g/dL   HCT 41.5 39.0 - 52.0 %   MCV 93.3 80.0 - 100.0 fL   MCH 30.6 26.0 - 34.0 pg   MCHC 32.8 30.0 - 36.0 g/dL   RDW 12.7 11.5 - 15.5 %   Platelets 215 150 - 400 K/uL   nRBC 0.0 0.0 - 0.2 %    Comment: Performed at [facility], [place] [address] [place], [place] [address]  Differential     Status: None   Collection Time: 04/21/22  1:17 PM  Result Value Ref Range   Neutrophils Relative % 60 %   Neutro Abs 5.3 1.7 - 7.7 K/uL   Lymphocytes Relative 22 %   Lymphs Abs 1.9 0.7 - 4.0 K/uL   Monocytes Relative 11 %   Monocytes Absolute 0.9 0.1 - 1.0 K/uL   Eosinophils Relative 6 %   Eosinophils Absolute 0.5 0.0 - 0.5 K/uL   Basophils Relative 1 %   Basophils Absolute 0.1 0.0 - 0.1 K/uL   Immature Granulocytes 0 %   Abs Immature Granulocytes 0.03 0.00 -  0.07 K/uL    Comment: Performed at [facility] [address] [place], [place] [address]  Comprehensive metabolic panel     Status: Abnormal   Collection Time: 04/21/22  1:17 PM  Result Value Ref Range   Sodium 140 135 - 145 mmol/L   Potassium 4.1 3.5 - 5.1 mmol/L   Chloride 107 98 - 111 mmol/L   CO2 25 22 - 32 mmol/L   Glucose, Bld 99 70 - 99 mg/dL    Comment: Glucose reference range applies only to samples taken after fasting for at least 8 hours.   BUN 16 8 - 23 mg/dL   Creatinine, Ser 1.30 (H) 0.61 - 1.24 mg/dL   Calcium 9.3 8.9 - 10.3 mg/dL   Total Protein 7.4 6.5 - 8.1 g/dL   Albumin 3.6 3.5 - 5.0 g/dL   AST 24 15 - 41 U/L   ALT 20 0 - 44 U/L   Alkaline Phosphatase 42 38 - 126 U/L   Total Bilirubin 0.6 0.3 - 1.2 mg/dL   GFR, Estimated 56 (L) >60 mL/min    Comment: (NOTE) Calculated using the CKD-EPI Creatinine Equation (2021)    Anion gap 8 5 - 15    Comment: Performed at [facility] [place] [address], [place], [place] [address]  Ethanol     Status: None   Collection Time: 04/21/22  1:17 PM  Result Value Ref Range   Alcohol, Ethyl (B) <10 <10 mg/dL    Comment: (NOTE) Lowest detectable limit for serum alcohol is 10 mg/dL.  For medical purposes only. Performed at [facility], [place] [address], [place], [place] [address]   I-stat chem 8, ED     Status: Abnormal   Collection Time: 04/21/22  1:26 PM  Result Value Ref Range   Sodium 142 135 - 145 mmol/L   Potassium 4.1 3.5 - 5.1 mmol/L   Chloride 107 98 - 111 mmol/L   BUN 15 8 - 23 mg/dL   Creatinine, Ser 1.20 0.61 - 1.24 mg/dL   Glucose, Bld  97 70 - 99 mg/dL    Comment: Glucose reference range applies only to samples taken after fasting for at least 8 hours.   Calcium, Ion 1.12 (L) 1.15 - 1.40 mmol/L   TCO2 23 22 - 32 mmol/L   Hemoglobin 13.9 13.0 - 17.0 g/dL   HCT 41.0 39.0 - 52.0 %  Resp Panel by RT-PCR (Flu A&B, Covid) Anterior Nasal Swab     Status: None   Collection Time: 04/21/22  2:42 PM    Specimen: Anterior Nasal Swab  Result Value Ref Range   SARS Coronavirus 2 by RT PCR NEGATIVE NEGATIVE    Comment: (NOTE) SARS-CoV-2 target nucleic acids are NOT DETECTED.  The SARS-CoV-2 RNA is generally detectable in upper respiratory specimens during the acute phase of infection. The lowest concentration of SARS-CoV-2 viral copies this assay can detect is 138 copies/mL. A negative result does not preclude SARS-Cov-2 infection and should not be used as the sole basis for treatment or other patient management decisions. A negative result may occur with  improper specimen collection/handling, submission of specimen other than nasopharyngeal swab, presence of viral mutation(s) within the areas targeted by this assay, and inadequate number of viral copies(<138 copies/mL). A negative result must be combined with clinical observations, patient history, and epidemiological information. The expected result is Negative.  Fact Sheet for Patients:  EntrepreneurPulse.com.au  Fact Sheet for Healthcare Providers:  IncredibleEmployment.be  This test is no t yet approved or cleared by the [place] FDA and  has been authorized for detection and/or diagnosis of SARS-CoV-2 by FDA under an Emergency Use Authorization (EUA). This EUA will remain  in effect (meaning this test can be used) for the duration of the COVID-19 declaration under Section 564(b)(1) of the Act, 21 U.S.C.section 360bbb-3(b)(1), unless the authorization is terminated  or revoked sooner.       Influenza A by PCR NEGATIVE NEGATIVE   Influenza B by PCR NEGATIVE NEGATIVE    Comment: (NOTE) The Xpert Xpress SARS-CoV-2/FLU/RSV plus assay is intended as an aid in the diagnosis of influenza from Nasopharyngeal swab specimens and should not be used as a sole basis for treatment. Nasal washings and aspirates are unacceptable for Xpert Xpress SARS-CoV-2/FLU/RSV testing.  Fact Sheet for  Patients: EntrepreneurPulse.com.au  Fact Sheet for Healthcare Providers: IncredibleEmployment.be  This test is not yet approved or cleared by the [place] FDA and has been authorized for detection and/or diagnosis of SARS-CoV-2 by FDA under an Emergency Use Authorization (EUA). This EUA will remain in effect (meaning this test can be used) for the duration of the COVID-19 declaration under Section 564(b)(1) of the Act, 21 U.S.C. section 360bbb-3(b)(1), unless the authorization is terminated or revoked.  Performed at [facility], [place] [address], [place], [place] [address]   Urine rapid drug screen (hosp performed)     Status: None   Collection Time: 04/21/22  2:42 PM  Result Value Ref Range   Opiates NONE DETECTED NONE DETECTED   Cocaine NONE DETECTED NONE DETECTED   Benzodiazepines NONE DETECTED NONE DETECTED   Amphetamines NONE DETECTED NONE DETECTED   Tetrahydrocannabinol NONE DETECTED NONE DETECTED   Barbiturates NONE DETECTED NONE DETECTED    Comment: (NOTE) DRUG SCREEN FOR MEDICAL PURPOSES ONLY.  IF CONFIRMATION IS NEEDED FOR ANY PURPOSE, NOTIFY LAB WITHIN 5 DAYS.  LOWEST DETECTABLE LIMITS FOR URINE DRUG SCREEN Drug Class                     Cutoff (  ng/mL) Amphetamine and metabolites    1000 Barbiturate and metabolites    200 Benzodiazepine                 416 Tricyclics and metabolites     300 Opiates and metabolites        300 Cocaine and metabolites        300 THC                            50 Performed at [facility], [place] [address], [place], [place] [address]   Urinalysis, Routine w reflex microscopic Anterior Nasal Swab     Status: Abnormal   Collection Time: 04/21/22  2:42 PM  Result Value Ref Range   Color, Urine YELLOW YELLOW   APPearance CLEAR CLEAR   Specific Gravity, Urine >1.046 (H) 1.005 - 1.030   pH 5.0 5.0 - 8.0   Glucose, UA NEGATIVE NEGATIVE mg/dL   Hgb urine dipstick NEGATIVE NEGATIVE    Bilirubin Urine NEGATIVE NEGATIVE   Ketones, ur NEGATIVE NEGATIVE mg/dL   Protein, ur NEGATIVE NEGATIVE mg/dL   Nitrite NEGATIVE NEGATIVE   Leukocytes,Ua NEGATIVE NEGATIVE    Comment: Performed at [facility] [address] [place], [place] [address]  CBG monitoring, ED     Status: None   Collection Time: 04/21/22  5:20 PM  Result Value Ref Range   Glucose-Capillary 95 70 - 99 mg/dL    Comment: Glucose reference range applies only to samples taken after fasting for at least 8 hours.   Comment 1 Notify RN    Comment 2 Document in Chart    DG Abd 1 View  Result Date: 04/21/2022 CLINICAL DATA:  Metal screening prior to MRI. EXAM: ABDOMEN - 1 VIEW COMPARISON:  05/23/2015. FINDINGS: Normal bowel gas pattern. Residual contrast noted within nondilated intrarenal collecting systems and ureters, as well as the bladder. No radiopaque foreign body. Skeletal structures are unremarkable. IMPRESSION: 1. No radiopaque/metallic foreign body.  No contraindication to MRI. Electronically Signed   By: Lajean Manes M.D.   On: 04/21/2022 17:19   DG CHEST PORT 1 VIEW  Result Date: 04/21/2022 CLINICAL DATA:  Neurologic deficit EXAM: PORTABLE CHEST 1 VIEW COMPARISON:  07/08/2016 FINDINGS: Two frontal views of the chest demonstrate a stable cardiac silhouette. Chronic interstitial opacities are seen throughout the lungs consistent with scarring. No acute airspace disease, effusion, or pneumothorax. No acute bony abnormalities. IMPRESSION: 1. No acute intrathoracic process. Electronically Signed   By: Randa Ngo M.D.   On: 04/21/2022 16:23   CT ANGIO HEAD NECK W WO CM W PERF (CODE STROKE)  Result Date: 04/21/2022 CLINICAL DATA:  Acute neuro deficit. EXAM: CT ANGIOGRAPHY HEAD AND NECK CT PERFUSION BRAIN TECHNIQUE: Multidetector CT imaging of the head and neck was performed using the standard protocol during bolus administration of intravenous contrast. Multiplanar CT image reconstructions and MIPs were  obtained to evaluate the vascular anatomy. Carotid stenosis measurements (when applicable) are obtained utilizing NASCET criteria, using the distal internal carotid diameter as the denominator. Multiphase CT imaging of the brain was performed following IV bolus contrast injection. Subsequent parametric perfusion maps were calculated using RAPID software. RADIATION DOSE REDUCTION: This exam was performed according to the departmental dose-optimization program which includes automated exposure control, adjustment of the mA and/or kV according to patient size and/or use of iterative reconstruction technique. CONTRAST:  20m OMNIPAQUE IOHEXOL 350 MG/ML SOLN COMPARISON:  CT head  04/21/2022 FINDINGS: CTA NECK FINDINGS Aortic arch: Atherosclerotic calcification aortic arch. Atherosclerotic disease in the proximal great vessels without significant stenosis Right carotid system: Extensive atherosclerotic disease throughout the right common carotid artery and right internal carotid artery. No significant stenosis. Left carotid system: Atherosclerotic disease throughout the left common carotid artery and left internal carotid artery. 50% diameter stenosis proximal left internal carotid artery. Vertebral arteries: Left vertebral artery dominant. Atherosclerotic calcification proximally without significant stenosis. Mild atherosclerotic calcification distal left vertebral artery. Diminutive right vertebral artery which is probably diffusely disease with minimal flow. Skeleton: No acute skeletal abnormality.  Cervical spondylosis. Other neck: Negative for mass or adenopathy in the neck. Upper chest: Lung apices clear bilaterally Review of the MIP images confirms the above findings CTA HEAD FINDINGS Anterior circulation: Atherosclerotic calcification and mild stenosis in the cavernous carotid bilaterally. Anterior and middle cerebral arteries patent bilaterally without large vessel occlusion. Mild atherosclerotic irregularity in  the middle cerebral arteries bilaterally. Negative for aneurysm. Posterior circulation: Left vertebral artery is widely patent to the basilar. Diminutive right vertebral artery has mild contribution to the basilar. Basilar patent. Superior cerebellar and posterior cerebral arteries patent bilaterally without stenosis. No aneurysm. Venous sinuses: Normal venous enhancement Anatomic variants: None Review of the MIP images confirms the above findings CT Brain Perfusion Findings: ASPECTS: 10 CBF (<30%) Volume: 39ML Perfusion (Tmax>6.0s) volume: 33ML Mismatch Volume: -79m Infarction Location:Small core infarct right anterolateral temporal lobe Small core infarct and decreased perfusion in the left parietal lobe. IMPRESSION: 1. CT perfusion is abnormal bilaterally. This involves the right temporal lobe and left parietal lobe. Recommend MRI to confirm evidence of acute infarction. 2. Negative for intracranial large vessel occlusion 3. Extensive atherosclerotic disease in the carotid bilaterally. 50% diameter stenosis proximal left internal carotid artery 4. Left vertebral artery widely patent. Diminutive right vertebral artery which appears diffusely diseased and has minimal contribution to the basilar. Electronically Signed   By: CFranchot GalloM.D.   On: 04/21/2022 14:11   CT HEAD WO CONTRAST  Result Date: 04/21/2022 CLINICAL DATA:  Acute neuro deficit rule out stroke. EXAM: CT HEAD WITHOUT CONTRAST TECHNIQUE: Contiguous axial images were obtained from the base of the skull through the vertex without intravenous contrast. RADIATION DOSE REDUCTION: This exam was performed according to the departmental dose-optimization program which includes automated exposure control, adjustment of the mA and/or kV according to patient size and/or use of iterative reconstruction technique. COMPARISON:  MRI head 05/15/2015 FINDINGS: Brain: Negative for acute infarct, hemorrhage, mass Mild atrophy. Negative for hydrocephalus. Chronic  infarct left occipital lobe and right parietal lobe. CSF density fluid anterior to the right temporal lobe measuring 24 x 41 mm unchanged from the prior study. Based on the MRI, this fluid may extend lateral to the right temporal lobe where there is prominent CSF similar to the prior study. 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 No midline shift. Vascular: Negative for hyperdense vessel Skull: Negative Sinuses/Orbits: Paranasal sinuses clear. Bilateral mastoid effusion. Bilateral middle ear effusion. Chronic mastoiditis bilaterally. Other: None IMPRESSION: No acute intracranial abnormality no change from the prior MRI. Bilateral mastoid and middle ear effusion Electronically Signed   By: CFranchot GalloM.D.   On: 04/21/2022 13:46    Pending Labs Unresulted Labs (From admission, onward)     Start     Ordered   04/22/22 0500  Lipid panel  Tomorrow morning,   R        04/21/22 1506   04/22/22 0500  Hemoglobin A1c  Tomorrow morning,  R        04/21/22 1506   04/22/22 0086  Basic metabolic panel  Tomorrow morning,   R        04/21/22 1606            Vitals/Pain Today's Vitals   04/21/22 1400 04/21/22 1445 04/21/22 1615 04/21/22 1724  BP: 133/67 (!) 143/61 (!) 133/114   Pulse: 71 70 71   Resp: (!) 26 (!) 24 16   Temp:    99 F (37.2 C)  TempSrc:    Oral  SpO2: 94% 94% 92%     Isolation Precautions No active isolations  Medications Medications  clopidogrel (PLAVIX) tablet 75 mg (75 mg Oral Given 04/21/22 1727)  insulin aspart (novoLOG) injection 0-9 Units ( Subcutaneous Not Given 04/21/22 1724)   stroke: early stages of recovery book (has no administration in time range)  acetaminophen (TYLENOL) tablet 650 mg (has no administration in time range)    Or  acetaminophen (TYLENOL) 160 MG/5ML solution 650 mg (has no administration in time range)    Or  acetaminophen (TYLENOL) suppository 650 mg (has no administration in time range)  senna-docusate (Senokot-S) tablet 1 tablet (has no administration  in time range)  aspirin EC tablet 81 mg (has no administration in time range)  iohexol (OMNIPAQUE) 350 MG/ML injection 75 mL (75 mLs Intravenous Contrast Given 04/21/22 1353)  aspirin chewable tablet 324 mg (324 mg Oral Given 04/21/22 1727)    Mobility walks     Focused Assessments Neuro Assessment Handoff:  Swallow screen pass? Yes    NIH Stroke Scale ( + Modified Stroke Scale Criteria)  Interval: Initial Level of Consciousness (1a.)   : Alert, keenly responsive LOC Questions (1b. )   +: Answers neither question correctly LOC Commands (1c. )   + : Performs both tasks correctly Best Gaze (2. )  +: Normal Visual (3. )  +: No visual loss Facial Palsy (4. )    : Normal symmetrical movements Motor Arm, Left (5a. )   +: No drift Motor Arm, Right (5b. )   +: No drift Motor Leg, Left (6a. )   +: No drift Motor Leg, Right (6b. )   +: No drift Limb Ataxia (7. ): Absent Sensory (8. )   +: Normal, no sensory loss Best Language (9. )   +: Mild-to-moderate aphasia Dysarthria (10. ): Normal Extinction/Inattention (11.)   +: No Abnormality Modified SS Total  +: 3 Complete NIHSS TOTAL: 4     Neuro Assessment: Exceptions to WDL Neuro Checks:   Initial (04/21/22 1303)  Last Documented NIHSS Modified Score: 3 (04/21/22 1630) Has TPA been given? No If patient is a Neuro Trauma and patient is going to OR before floor call report to [place] nurse: [phone] or [phone]   R Recommendations: See Admitting Provider Note  Report given to:   Additional Notes:

## 2022-04-21 NOTE — Assessment & Plan Note (Signed)
76 year old presenting with acute onset of dysarthria and some confusion with hx of CVA, CAD, T2DM, carotid stenosis, HTN, HLD  -place in observation on telemetry for TIA/stroke work-up -Neurochecks per protocol -Neurology consulted -MRI brain without contrast  -echo -a1c/lipid panel, continue crestor  -Continue plavix, ASA added per neurology  -Permissive hypertension first 24 hours <220/110 -N.p.o. until bedside swallow screen -PT/ OT/ SLP consult

## 2022-04-21 NOTE — Assessment & Plan Note (Signed)
Followed by vascular surgery Last carotid US in 10/2021: right ICA 1-39% stenosis, left ICA: 40-59% stenosis  -continue plavix and statin

## 2022-04-21 NOTE — Assessment & Plan Note (Signed)
Continue crestor Lipid panel pending, goal LDL <70

## 2022-04-21 NOTE — Assessment & Plan Note (Signed)
Euvolemic Strict I/O Last echo in 2018 with EF of 30-35%. Grade 2 DD Blood pressure limited medical management. Can not tolerate entresto, ARB now PRN. Lasix PRN Continue beta blocker

## 2022-04-21 NOTE — Assessment & Plan Note (Signed)
Allow for permissive HTN in first 24-48 hours Hold home medication IV parameters for >220/110

## 2022-04-21 NOTE — Assessment & Plan Note (Signed)
At baseline, continue to monitor °

## 2022-04-21 NOTE — Consult Note (Signed)
Neurology Consultation  Reason for Consult: altered mental status and aphasia Referring Physician: Dr. Kathrynn Humble  CC: none  History is obtained from:family and chart  HPI: Damon Walker is a 76 y.o. 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  His family stated that he was last speaking normally at 0400, and that this morning they notice him speaking abnormally and answering "I don't know" to questions.  According to family, he is pretty independent at baseline and still drives.  Head CT NAICP. TNK not administered 2/2 presentation outside the window.   CTA H&N / CTP  1. CT perfusion is abnormal bilaterally. This involves the right temporal lobe and left parietal lobe. Recommend MRI to confirm evidence of acute infarction. 2. Negative for intracranial large vessel occlusion 3. Extensive atherosclerotic disease in the carotid bilaterally. 50% diameter stenosis proximal left internal carotid artery 4. Left vertebral artery widely patent. Diminutive right vertebral artery which appears diffusely diseased and has minimal contribution to the basilar.  All CNS imaging personally reviewed by Dr. Quinn Axe. CT perfusion showed no perfusion mismatch, core matched perfusion deficit. CTA showed no LVO therefore no intervention indicated.   LKW: 0400 TNK given?: no, outside of window IR Thrombectomy? No, no LVO Modified Rankin Scale: 1-No significant post stroke disability and can perform usual duties with stroke symptoms  ROS:  Unable to obtain due to aphasia.   Past Medical History:  Diagnosis Date   Carotid arterial disease ([place]) 06/2014; 01/09/2015   Followed by Dr. Donnetta Hutching of [facility] -- a) Carotid u/s: 40-59% bilat ICA stenosis; b) 10/2019: Carotid Dopplers October 19, 2200: R ICA 5-42%, LICA 70-62%.  Right vertebral artery  appears occluded.  Normal subclavian flow.  Normal left vertebral artery. 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  MRI Brain: Acute L Occipital Lobe Infarct. Chronic microvascular ischemic changes in the white matter and left pons.  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   Chronic combined systolic and diastolic congestive heart failure, NYHA class 2 ([place]) 05/11/2015 - 05/15/15   a. EF 20-25% with anterior-anteroseptal akinesis (immediately post anterior STEMI).  ; b. 10/10/'16 Echo: EF 35-40% with moderate mid-apical anteroseptal, anterior and apical hypokinesis.   Coronary artery disease involving native heart 05/11/2015   Cath: 3V CAD with 90% prox RCA, 80% mid RCA, 80% OM3, (RCA and OM residual treated medically) 100% LAD - PCI to Prox-Mid LAD with Overlapping Promus Premier DES 3.0 x 38 & 3.0 x 16 (post-dilated to 3.5 mm)    Essential hypertension    H/O: GI bleed 05/15/2015   s/p Sigmoid Polypectomy 05/04/2015 - prior to STEMI on May 11, 2015; Had GI Bleed following TPA for CVA, while on ASA & Brilinta; Flex Sig - 1. Internal Hemorrhoids, 2. Distal sigmoid polypectomy site with flat Whitebase status post biopsy and tattoo with 64m spot over 3 injections 3. Proximal sigmoid polypectomy site seen as well, 4) otherwise normal with no evidence of bleed   Hyperlipidemia with target LDL less than 70    Ischemic cardiomyopathy 05/11/2015   EF 40% on cath 05/11/2015 after anterior STEMI, EF 20-25% on echo 05/13/2015; 12/2015 =  EF ~45%.   Liver disease, chronic, with cirrhosis ([place]) 11/2019   Mild possible early cirrhotic liver disease noted on CT scan.   Prediabetes Oct 2016   A1C 6.0 in Oct 2016   ST elevation myocardial infarction (STEMI) involving left  anterior descending (LAD) coronary artery with complication ([place]) 16/08/958   100% Prox LAD - PCI with overlapping DES x 2   Tobacco abuse    a. 30 yrs - 1.5 ppd. - Quit 05/11/2015   Type 2 diabetes mellitus with other specified complication ([place]) 4/54/0981   Vision abnormalities    Right hemianopsia     Family History  Adopted: Yes  Problem Relation Age of Onset   Other Mother        eczema    Cancer Mother    Other Other        Adopted - unaware of biological parent's histories.     Social History:   reports that he quit smoking about 7 years ago. His smoking use included cigarettes. He has a 30.00 pack-year smoking history. He has been exposed to tobacco smoke. He has never used smokeless tobacco. He reports that he does not drink alcohol and does not use drugs.  Medications  Current Facility-Administered Medications:    aspirin suppository 300 mg, 300 mg, Rectal, Daily, de Yolanda Manges, Cortney E, NP   clopidogrel (PLAVIX) tablet 75 mg, 75 mg, Oral, Daily, de Yolanda Manges, Cortney E, NP  Current Outpatient Medications:    carvedilol (COREG) 12.5 MG tablet, Take 1 tablet (12.5 mg total) by mouth 2 (two) times daily., Disp: 180 tablet, Rfl: 3   clopidogrel (PLAVIX) 75 MG tablet, Take 1 tablet (75 mg total) by mouth daily., Disp: 30 tablet, Rfl: 1   ferrous sulfate 325 (65 FE) MG tablet, Take 325 mg by mouth daily with breakfast., Disp: , Rfl:    furosemide (LASIX) 20 MG tablet, Take 1 tablet (20 mg total) by mouth daily as needed., Disp: 30 tablet, Rfl: 6   losartan (COZAAR) 25 MG tablet, TAKE 1 TABLET BY MOUTH ONCE DAILY AS NEEDED, Disp: 20 tablet, Rfl: 6   nitroGLYCERIN (NITROSTAT) 0.4 MG SL tablet, Place 1 tablet (0.4 mg total) under the tongue every 5 (five) minutes x 3 doses as needed for chest pain., Disp: 25 tablet, Rfl: 3   pantoprazole (PROTONIX) 20 MG tablet, Take 20 mg by mouth daily. As of 04/19/2020 TAKES 1 TABLET (20 MG) ONCE EVERY OTHER DAY TO ALTERNATE WITH FAMOTIDINE,  Disp: , Rfl:    rosuvastatin (CRESTOR) 40 MG tablet, Take 1 tablet (40 mg total) by mouth daily at 6 PM., Disp: 90 tablet, Rfl: 3   Exam: Current vital signs: BP (!) 143/61   Pulse 70   Temp 98.7 F (37.1 C) (Oral)   Resp (!) 24   SpO2 94%  Vital signs in last 24 hours: Temp:  [98.7 F (37.1 C)] 98.7 F (37.1 C) (09/17 1301) Pulse Rate:  [70-72] 70 (09/17 1445) Resp:  [16-26] 24 (09/17 1445) BP: (133-163)/(61-78) 143/61 (09/17 1445) SpO2:  [94 %-97 %] 94 % (09/17 1445)  GENERAL: Awake, alert, in no acute distress Psych: Affect appropriate for situation, patient is calm and cooperative with examination Head: Normocephalic and atraumatic, without obvious abnormality EENT: Normal conjunctivae, moist mucous membranes, no OP obstruction LUNGS: Normal respiratory effort. Non-labored breathing on room air CV: Regular rate and rhythm on telemetry Extremities: warm, well perfused, without obvious deformity  NEURO:  Mental Status: awake, alert, unable to  answer orientation questions or follow commands Speech/Language: severe global aphasia but not dysarthric.   Unable to name objects or repeat phrases, Answers questions with "I don't know" No neglect is noted Cranial Nerves:  II: PERRL.  III, IV, VI: EOMI. Lid elevation symmetric and full.  V: Sensation is intact to light touch and symmetrical to face. Blinks to threat. Moves jaw back and forth.  VII: R UMN facial droop VIII: Hearing intact to voice IX, X: Phonation normal.  XII: Tongue protrudes midline without fasciculations.   Motor: 5/5 strength is all muscle groups.  Tone is normal. Bulk is normal.  Sensation: Intact to light touch bilaterally in all four extremities. No extinction to DSS present.  Coordination: UTA 2/2 inability to follow commands Gait: Deferred  NIHSS: 1a Level of Conscious.: 0 1b LOC Questions: 2 1c LOC Commands: 2 2 Best Gaze: 0 3 Visual: 0 4 Facial Palsy: 2 5a Motor Arm - left: 0 5b Motor Arm -  Right: 0 6a Motor Leg - Left: 0 6b Motor Leg - Right: 0 7 Limb Ataxia: 0 8 Sensory: 0 9 Best Language: 2 10 Dysarthria: 0 11 Extinct. and Inatten.: 0 TOTAL: 8   Labs I have reviewed labs in epic and the results pertinent to this consultation are:   CBC    Component Value Date/Time   WBC 8.8 04/21/2022 1317   RBC 4.45 04/21/2022 1317   HGB 13.9 04/21/2022 1326   HCT 41.0 04/21/2022 1326   PLT 215 04/21/2022 1317   MCV 93.3 04/21/2022 1317   MCH 30.6 04/21/2022 1317   MCHC 32.8 04/21/2022 1317   RDW 12.7 04/21/2022 1317   LYMPHSABS 1.9 04/21/2022 1317   MONOABS 0.9 04/21/2022 1317   EOSABS 0.5 04/21/2022 1317   BASOSABS 0.1 04/21/2022 1317    CMP     Component Value Date/Time   NA 142 04/21/2022 1326   NA 146 (H) 11/24/2019 1830   K 4.1 04/21/2022 1326   CL 107 04/21/2022 1326   CO2 25 04/21/2022 1317   GLUCOSE 97 04/21/2022 1326   BUN 15 04/21/2022 1326   BUN 24 11/24/2019 1830   CREATININE 1.20 04/21/2022 1326   CREATININE 1.51 (H) 09/24/2016 1554   CALCIUM 9.3 04/21/2022 1317   PROT 7.4 04/21/2022 1317   ALBUMIN 3.6 04/21/2022 1317   AST 24 04/21/2022 1317   ALT 20 04/21/2022 1317   ALKPHOS 42 04/21/2022 1317   BILITOT 0.6 04/21/2022 1317   GFRNONAA 56 (L) 04/21/2022 1317   GFRAA 69 11/24/2019 1830    Lipid Panel     Component Value Date/Time   CHOL 109 07/08/2016 0451   TRIG 78 07/08/2016 0451   HDL 39 (L) 07/08/2016 0451   CHOLHDL 2.8 07/08/2016 0451   VLDL 16 07/08/2016 0451   LDLCALC 54 07/08/2016 0451     Imaging I have reviewed the images obtained:  CT-scan of the brain No acute abnormality.  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  CTP shows completed infarct. He was outside the window for TNK and did not undergo intervention 2/2 no LVO.  Will admit to hospitalist service,  consult PT/OT/SLP, perform echocardiogram, check A1c and lipid panel and give DAPT with aspirin and clopidogrel  Impression:Acute ischemic stroke in patient with multiple risk factors  Recommendations: - Admit for stroke workup - Permissive HTN x48 hrs from sx onset or until stroke ruled out by MRI goal BP <220/110. PRN labetalol or hydralazine if BP above these parameters. Avoid oral antihypertensives. - MRI brain wo contrast - TTE  - Check A1c and LDL + add statin per guidelines - ASA '81mg'$  daily + plavix '75mg'$  daily x90 days given intracranial R vert severe stenosis f/b ASA plavix '75mg'$  daily monotherapy after that - q4 hr neuro checks - STAT head CT for any change in neuro exam - Tele - PT/OT/SLP - Stroke education - Amb referral to neurology upon discharge    Stroke team will f/u tmrw  Pt seen by NP/Neuro and later by MD. Note/plan to be edited by MD as needed.  [place] , MSN, AGACNP-BC [facility] See Amion for schedule and pager information 04/21/2022 3:06 PM  Attending Neurologist's note:   I personally saw this patient, gathering history, performing a full neurologic examination, reviewing relevant labs, personally reviewing relevant imaging, and formulated the assessment and plan, adding the note above for completeness and clarity to accurately reflect my findings and recommendations.   Su Monks, MD [facility] [phone]  If 7pm- 7am, please page neurology on call as listed in [place].

## 2022-04-21 NOTE — Assessment & Plan Note (Addendum)
Hx of STEMI with drug eluding stent No active angina.  Significant underlying disease. Continue statin and beta blocker after permissive HTN Continue plavix

## 2022-04-21 NOTE — ED Notes (Signed)
Pt taken to MRI  

## 2022-04-22 ENCOUNTER — Ambulatory Visit (HOSPITAL_COMMUNITY): Payer: Medicare Other

## 2022-04-22 ENCOUNTER — Observation Stay (HOSPITAL_COMMUNITY): Payer: Medicare Other

## 2022-04-22 DIAGNOSIS — I2510 Atherosclerotic heart disease of native coronary artery without angina pectoris: Secondary | ICD-10-CM | POA: Diagnosis present

## 2022-04-22 DIAGNOSIS — E785 Hyperlipidemia, unspecified: Secondary | ICD-10-CM

## 2022-04-22 DIAGNOSIS — I252 Old myocardial infarction: Secondary | ICD-10-CM | POA: Diagnosis not present

## 2022-04-22 DIAGNOSIS — I63231 Cerebral infarction due to unspecified occlusion or stenosis of right carotid arteries: Secondary | ICD-10-CM | POA: Diagnosis not present

## 2022-04-22 DIAGNOSIS — N179 Acute kidney failure, unspecified: Secondary | ICD-10-CM

## 2022-04-22 DIAGNOSIS — R410 Disorientation, unspecified: Secondary | ICD-10-CM | POA: Diagnosis not present

## 2022-04-22 DIAGNOSIS — E876 Hypokalemia: Secondary | ICD-10-CM | POA: Diagnosis present

## 2022-04-22 DIAGNOSIS — Z91040 Latex allergy status: Secondary | ICD-10-CM | POA: Diagnosis not present

## 2022-04-22 DIAGNOSIS — Z8673 Personal history of transient ischemic attack (TIA), and cerebral infarction without residual deficits: Secondary | ICD-10-CM | POA: Diagnosis not present

## 2022-04-22 DIAGNOSIS — I6522 Occlusion and stenosis of left carotid artery: Secondary | ICD-10-CM | POA: Diagnosis present

## 2022-04-22 DIAGNOSIS — J9602 Acute respiratory failure with hypercapnia: Secondary | ICD-10-CM

## 2022-04-22 DIAGNOSIS — Z955 Presence of coronary angioplasty implant and graft: Secondary | ICD-10-CM | POA: Diagnosis not present

## 2022-04-22 DIAGNOSIS — R4701 Aphasia: Secondary | ICD-10-CM | POA: Diagnosis present

## 2022-04-22 DIAGNOSIS — R7303 Prediabetes: Secondary | ICD-10-CM | POA: Diagnosis not present

## 2022-04-22 DIAGNOSIS — Z9861 Coronary angioplasty status: Secondary | ICD-10-CM | POA: Diagnosis not present

## 2022-04-22 DIAGNOSIS — J9601 Acute respiratory failure with hypoxia: Secondary | ICD-10-CM | POA: Diagnosis not present

## 2022-04-22 DIAGNOSIS — Z79899 Other long term (current) drug therapy: Secondary | ICD-10-CM | POA: Diagnosis not present

## 2022-04-22 DIAGNOSIS — I6389 Other cerebral infarction: Secondary | ICD-10-CM | POA: Diagnosis not present

## 2022-04-22 DIAGNOSIS — G4700 Insomnia, unspecified: Secondary | ICD-10-CM | POA: Diagnosis not present

## 2022-04-22 DIAGNOSIS — I161 Hypertensive emergency: Secondary | ICD-10-CM | POA: Diagnosis not present

## 2022-04-22 DIAGNOSIS — N1831 Chronic kidney disease, stage 3a: Secondary | ICD-10-CM | POA: Diagnosis present

## 2022-04-22 DIAGNOSIS — I69320 Aphasia following cerebral infarction: Secondary | ICD-10-CM | POA: Diagnosis not present

## 2022-04-22 DIAGNOSIS — E1122 Type 2 diabetes mellitus with diabetic chronic kidney disease: Secondary | ICD-10-CM | POA: Diagnosis present

## 2022-04-22 DIAGNOSIS — I639 Cerebral infarction, unspecified: Secondary | ICD-10-CM

## 2022-04-22 DIAGNOSIS — I255 Ischemic cardiomyopathy: Secondary | ICD-10-CM | POA: Diagnosis present

## 2022-04-22 DIAGNOSIS — Z20822 Contact with and (suspected) exposure to covid-19: Secondary | ICD-10-CM | POA: Diagnosis present

## 2022-04-22 DIAGNOSIS — G479 Sleep disorder, unspecified: Secondary | ICD-10-CM | POA: Diagnosis not present

## 2022-04-22 DIAGNOSIS — E440 Moderate protein-calorie malnutrition: Secondary | ICD-10-CM | POA: Diagnosis not present

## 2022-04-22 DIAGNOSIS — I130 Hypertensive heart and chronic kidney disease with heart failure and stage 1 through stage 4 chronic kidney disease, or unspecified chronic kidney disease: Secondary | ICD-10-CM | POA: Diagnosis present

## 2022-04-22 DIAGNOSIS — I63512 Cerebral infarction due to unspecified occlusion or stenosis of left middle cerebral artery: Secondary | ICD-10-CM | POA: Diagnosis present

## 2022-04-22 DIAGNOSIS — I5040 Unspecified combined systolic (congestive) and diastolic (congestive) heart failure: Secondary | ICD-10-CM | POA: Diagnosis not present

## 2022-04-22 DIAGNOSIS — I110 Hypertensive heart disease with heart failure: Secondary | ICD-10-CM | POA: Diagnosis not present

## 2022-04-22 DIAGNOSIS — J810 Acute pulmonary edema: Secondary | ICD-10-CM | POA: Diagnosis not present

## 2022-04-22 DIAGNOSIS — I10 Essential (primary) hypertension: Secondary | ICD-10-CM | POA: Diagnosis not present

## 2022-04-22 DIAGNOSIS — I69351 Hemiplegia and hemiparesis following cerebral infarction affecting right dominant side: Secondary | ICD-10-CM | POA: Diagnosis not present

## 2022-04-22 DIAGNOSIS — I5042 Chronic combined systolic (congestive) and diastolic (congestive) heart failure: Secondary | ICD-10-CM | POA: Diagnosis present

## 2022-04-22 DIAGNOSIS — I509 Heart failure, unspecified: Secondary | ICD-10-CM | POA: Diagnosis not present

## 2022-04-22 DIAGNOSIS — I248 Other forms of acute ischemic heart disease: Secondary | ICD-10-CM | POA: Diagnosis not present

## 2022-04-22 DIAGNOSIS — L03113 Cellulitis of right upper limb: Secondary | ICD-10-CM | POA: Diagnosis not present

## 2022-04-22 DIAGNOSIS — F32A Depression, unspecified: Secondary | ICD-10-CM | POA: Diagnosis present

## 2022-04-22 DIAGNOSIS — R0603 Acute respiratory distress: Secondary | ICD-10-CM | POA: Diagnosis not present

## 2022-04-22 DIAGNOSIS — Z781 Physical restraint status: Secondary | ICD-10-CM | POA: Diagnosis not present

## 2022-04-22 LAB — BASIC METABOLIC PANEL
Anion gap: 14 (ref 5–15)
BUN: 17 mg/dL (ref 8–23)
CO2: 24 mmol/L (ref 22–32)
Calcium: 9.1 mg/dL (ref 8.9–10.3)
Chloride: 104 mmol/L (ref 98–111)
Creatinine, Ser: 1.63 mg/dL — ABNORMAL HIGH (ref 0.61–1.24)
GFR, Estimated: 43 mL/min — ABNORMAL LOW (ref >60)
Glucose, Bld: 211 mg/dL — ABNORMAL HIGH (ref 70–99)
Potassium: 4.2 mmol/L (ref 3.5–5.1)
Sodium: 142 mmol/L (ref 135–145)

## 2022-04-22 LAB — TROPONIN I (HIGH SENSITIVITY)
Troponin I (High Sensitivity): 55 ng/L — ABNORMAL HIGH (ref <18)
Troponin I (High Sensitivity): 118 ng/L (ref <18)

## 2022-04-22 LAB — POCT I-STAT 7, (LYTES, BLD GAS, ICA,H+H)
Acid-Base Excess: 3 mmol/L — ABNORMAL HIGH (ref 0.0–2.0)
Acid-base deficit: 3 mmol/L — ABNORMAL HIGH (ref 0.0–2.0)
Bicarbonate: 22.8 mmol/L (ref 20.0–28.0)
Bicarbonate: 27.3 mmol/L (ref 20.0–28.0)
Calcium, Ion: 1.24 mmol/L (ref 1.15–1.40)
Calcium, Ion: 1.18 mmol/L (ref 1.15–1.40)
HCT: 39 % (ref 39.0–52.0)
HCT: 39 % (ref 39.0–52.0)
Hemoglobin: 13.3 g/dL (ref 13.0–17.0)
Hemoglobin: 13.3 g/dL (ref 13.0–17.0)
O2 Saturation: 88 %
O2 Saturation: 96 %
Patient temperature: 99.1
Patient temperature: 96.9
Potassium: 4 mmol/L (ref 3.5–5.1)
Potassium: 3.3 mmol/L — ABNORMAL LOW (ref 3.5–5.1)
Sodium: 140 mmol/L (ref 135–145)
Sodium: 142 mmol/L (ref 135–145)
TCO2: 24 mmol/L (ref 22–32)
TCO2: 28 mmol/L (ref 22–32)
pCO2 arterial: 45 mmHg (ref 32–48)
pCO2 arterial: 38.1 mmHg (ref 32–48)
pH, Arterial: 7.314 — ABNORMAL LOW (ref 7.35–7.45)
pH, Arterial: 7.46 — ABNORMAL HIGH (ref 7.35–7.45)
pO2, Arterial: 61 mmHg — ABNORMAL LOW (ref 83–108)
pO2, Arterial: 72 mmHg — ABNORMAL LOW (ref 83–108)

## 2022-04-22 LAB — ECHOCARDIOGRAM COMPLETE
AR max vel: 3.78 cm2
AV Area VTI: 4.05 cm2
AV Area mean vel: 3.68 cm2
AV Mean grad: 1 mmHg
AV Peak grad: 2.6 mmHg
Ao pk vel: 0.8 m/s
Area-P 1/2: 2.14 cm2
Calc EF: 48.1 %
Height: 68 in
MV M vel: 1.97 m/s
MV Peak grad: 15.5 mmHg
S' Lateral: 4.8 cm
Single Plane A2C EF: 43.2 %
Single Plane A4C EF: 51.6 %
Weight: 3086.44 oz

## 2022-04-22 LAB — GLUCOSE, CAPILLARY
Glucose-Capillary: 219 mg/dL — ABNORMAL HIGH (ref 70–99)
Glucose-Capillary: 171 mg/dL — ABNORMAL HIGH (ref 70–99)
Glucose-Capillary: 177 mg/dL — ABNORMAL HIGH (ref 70–99)
Glucose-Capillary: 133 mg/dL — ABNORMAL HIGH (ref 70–99)
Glucose-Capillary: 93 mg/dL (ref 70–99)
Glucose-Capillary: 104 mg/dL — ABNORMAL HIGH (ref 70–99)
Glucose-Capillary: 104 mg/dL — ABNORMAL HIGH (ref 70–99)

## 2022-04-22 LAB — LIPID PANEL
Cholesterol: 124 mg/dL (ref 0–200)
HDL: 34 mg/dL — ABNORMAL LOW (ref >40)
LDL Cholesterol: 69 mg/dL (ref 0–99)
Total CHOL/HDL Ratio: 3.6 RATIO
Triglycerides: 103 mg/dL (ref <150)
VLDL: 21 mg/dL (ref 0–40)

## 2022-04-22 LAB — BLOOD GAS, ARTERIAL
Acid-base deficit: 4.4 mmol/L — ABNORMAL HIGH (ref 0.0–2.0)
Bicarbonate: 26.1 mmol/L (ref 20.0–28.0)
Drawn by: 24487
O2 Saturation: 89.5 %
Patient temperature: 37.5
pCO2 arterial: 77 mmHg (ref 32–48)
pH, Arterial: 7.14 — CL (ref 7.35–7.45)
pO2, Arterial: 67 mmHg — ABNORMAL LOW (ref 83–108)

## 2022-04-22 LAB — HEMOGLOBIN A1C
Hgb A1c MFr Bld: 5.8 % — ABNORMAL HIGH (ref 4.8–5.6)
Mean Plasma Glucose: 119.76 mg/dL

## 2022-04-22 LAB — MRSA NEXT GEN BY PCR, NASAL: MRSA by PCR Next Gen: NOT DETECTED

## 2022-04-22 MED ORDER — FUROSEMIDE 10 MG/ML IJ SOLN
20.0000 mg | Freq: Once | INTRAMUSCULAR | Status: AC
  Administered 2022-04-22: 20 mg via INTRAVENOUS
  Filled 2022-04-22: qty 2

## 2022-04-22 MED ORDER — ORAL CARE MOUTH RINSE
15.0000 mL | OROMUCOSAL | Status: DC
  Administered 2022-04-22 – 2022-04-30 (×19): 15 mL via OROMUCOSAL

## 2022-04-22 MED ORDER — FUROSEMIDE 10 MG/ML IJ SOLN
20.0000 mg | Freq: Once | INTRAMUSCULAR | Status: DC
  Filled 2022-04-22: qty 2

## 2022-04-22 MED ORDER — CHLORHEXIDINE GLUCONATE CLOTH 2 % EX PADS
6.0000 | MEDICATED_PAD | Freq: Every day | CUTANEOUS | Status: DC
  Administered 2022-04-22 – 2022-04-29 (×3): 6 via TOPICAL

## 2022-04-22 MED ORDER — ASPIRIN 81 MG PO TBEC
81.0000 mg | DELAYED_RELEASE_TABLET | Freq: Every day | ORAL | Status: DC
  Administered 2022-04-25 – 2022-05-01 (×6): 81 mg via ORAL
  Filled 2022-04-22 (×6): qty 1

## 2022-04-22 MED ORDER — ASPIRIN 300 MG RE SUPP
300.0000 mg | Freq: Every day | RECTAL | Status: DC
  Administered 2022-04-22 – 2022-04-23 (×2): 300 mg via RECTAL
  Filled 2022-04-22 (×4): qty 1

## 2022-04-22 MED ORDER — DEXMEDETOMIDINE HCL IN NACL 400 MCG/100ML IV SOLN
INTRAVENOUS | Status: AC
  Filled 2022-04-22: qty 100

## 2022-04-22 MED ORDER — POTASSIUM CHLORIDE 10 MEQ/100ML IV SOLN
10.0000 meq | INTRAVENOUS | Status: AC
  Administered 2022-04-22 (×2): 10 meq via INTRAVENOUS
  Filled 2022-04-22 (×2): qty 100

## 2022-04-22 MED ORDER — ALBUTEROL SULFATE (2.5 MG/3ML) 0.083% IN NEBU: 2.5000 mg | INHALATION_SOLUTION | Freq: Once | RESPIRATORY_TRACT | Status: DC | PRN

## 2022-04-22 MED ORDER — PERFLUTREN LIPID MICROSPHERE
1.0000 mL | INTRAVENOUS | Status: AC | PRN
  Administered 2022-04-22: 3 mL via INTRAVENOUS

## 2022-04-22 MED ORDER — FUROSEMIDE 10 MG/ML IJ SOLN
20.0000 mg | Freq: Once | INTRAMUSCULAR | Status: AC
  Administered 2022-04-22: 20 mg via INTRAVENOUS

## 2022-04-22 MED ORDER — DEXMEDETOMIDINE HCL IN NACL 400 MCG/100ML IV SOLN
0.4000 ug/kg/h | INTRAVENOUS | Status: DC
  Administered 2022-04-22 (×2): 0.4 ug/kg/h via INTRAVENOUS
  Filled 2022-04-22: qty 100

## 2022-04-22 MED ORDER — FUROSEMIDE 10 MG/ML IJ SOLN
INTRAMUSCULAR | Status: AC
  Filled 2022-04-22: qty 4

## 2022-04-22 NOTE — Significant Event (Addendum)
Rapid Response Event Note   Reason for Call :  SOB, hypoxia  Initial Focused Assessment:  Pt lying in bed with eyes closed, in resp distress, +WOB, +accessory. Pt moving in the bed but not responsive to verbal  stimuli. Pupils 3s, equal,and reactive. Lungs with B crackles in the bases and decreased air movement. Skin cool/diaphoretic.  T-99.1, HR-106, SBP-180s, RR-40s, SpO2-85% on NRB   Interventions:  CBG-179 PCXR-Unchanged bilateral interstitial opacities, possibly mild pulmonary edema superimposed on scarring. ABG-7.14/77/67/26.1 EKG-ST Trop Albuterol tx-cancelled '20mg'$  lasix IV Tx to ICU Plan of Care:  Tx to 4N21. Bipap on arrival.   Event Summary:   MD Notified: Dr. Hal Hope notified and came to bedside, PCCM consulted and came to bedside Call [place], Ihor Meinzer Anderson, RN

## 2022-04-22 NOTE — Progress Notes (Signed)
  Echocardiogram 2D Echocardiogram has been performed.  Lana Fish 04/22/2022, 12:54 PM

## 2022-04-22 NOTE — Progress Notes (Signed)
PT Cancellation Note  Patient Details Name: Damon Walker MRN: [id number] DOB: [birth year]   Cancelled Treatment:    Reason Eval/Treat Not Completed: Medical issues which prohibited therapy this morning. Per RN, pt attempting to wean from BiPap to [name] and remains on precedex, RN asked PT to hold until later in the day. Will continue to follow and evaluate as appropriate.   [place], PT, DPT   Acute Rehabilitation Department   Sandra Cockayne 04/22/2022, 8:35 AM

## 2022-04-22 NOTE — Progress Notes (Addendum)
[facility] Progress Note Patient Name: Damon Walker DOB: Apr 14, 1946 MRN: [id number]   Date of Service  04/22/2022  HPI/Events of Note  76/M with history of carotid artery disease, prior stroke, who presents with confusion, and dysarthria. Code stroke had been called, CT negative for bleed. Unfortunately, pt was outside the window from thrombolytic therapy. He was admitted to the floor for further management.  Overnight, RRT was called as pt was reported to be in respiratory distress. He was hypertensive, with SBP 180s, SpO2 down to the 80s, and had bilateral crackles on exam. He was given lasix and transferred to the ICU.   eICU Interventions  Acute hypoxemic respiratory failure - Likely secondary to pulmonary edema - Given lasix - monitor I/Os.  - BP control  - Plan for echo in AM - Continue supplemental O2.  - Would consider BIPAP if with worsening oxygenation.   2. Stroke - Continue serial neurochecks - Planned for MRI brain with contrast - Continue antiplatelets, statin.         Jette 04/22/2022, 1:27 AM

## 2022-04-22 NOTE — Evaluation (Signed)
Clinical/Bedside Swallow Evaluation Patient Details  Name: Damon Walker MRN: [id number] Date of Birth: 1945-08-13  Today's Date: 04/22/2022 Time: SLP Start Time (ACUTE ONLY): 1115 SLP Stop Time (ACUTE ONLY): 1125 SLP Time Calculation (min) (ACUTE ONLY): 10 min  Past Medical History:  Past Medical History:  Diagnosis Date   Carotid arterial disease ([place]) 06/2014; 01/09/2015   Followed by Dr. Donnetta Hutching of [facility] -- a) Carotid u/s: 40-59% bilat ICA stenosis; b) 10/2019: Carotid Dopplers October 20, 4095: R ICA 3-53%, LICA 29-92%.  Right vertebral artery appears occluded.  Normal subclavian flow.  Normal left vertebral artery. 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  MRI Brain: Acute L Occipital Lobe Infarct. Chronic microvascular ischemic changes in the white matter and left pons.  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   Chronic combined systolic and diastolic congestive heart failure, NYHA class 2 ([place]) 05/11/2015 - 05/15/15   a. EF 20-25% with anterior-anteroseptal akinesis (immediately post anterior STEMI).  ; b. 10/10/'16 Echo: EF 35-40% with moderate mid-apical anteroseptal, anterior and apical hypokinesis.   Coronary artery disease involving native heart 05/11/2015   Cath: 3V CAD with 90% prox RCA, 80% mid RCA, 80% OM3, (RCA and OM residual treated medically) 100% LAD - PCI to Prox-Mid LAD with Overlapping Promus Premier DES 3.0 x 38 & 3.0 x 16 (post-dilated to 3.5 mm)    Essential hypertension    H/O: GI bleed 05/15/2015   s/p Sigmoid Polypectomy 05/04/2015 - prior to STEMI on May 11, 2015; Had GI Bleed following TPA for CVA, while on ASA & Brilinta; Flex Sig - 1. Internal Hemorrhoids, 2. Distal sigmoid polypectomy site with flat Whitebase status post  biopsy and tattoo with 57m spot over 3 injections 3. Proximal sigmoid polypectomy site seen as well, 4) otherwise normal with no evidence of bleed   Hyperlipidemia with target LDL less than 70    Ischemic cardiomyopathy 05/11/2015   EF 40% on cath 05/11/2015 after anterior STEMI, EF 20-25% on echo 05/13/2015; 12/2015 =  EF ~45%.   Liver disease, chronic, with cirrhosis [place] 11/2019   Mild possible early cirrhotic liver disease noted on CT scan.   Prediabetes Oct 2016   A1C 6.0 in Oct 2016   ST elevation myocardial infarction (STEMI) involving left anterior descending (LAD) coronary artery with complication ([place] 142/01/8340  100% Prox LAD - PCI with overlapping DES x 2   Tobacco abuse    a. 30 yrs - 1.5 ppd. - Quit 05/11/2015   Type 2 diabetes mellitus with other specified complication ([place] 39/62/2297  Vision abnormalities    Right hemianopsia   Past Surgical History:  Past Surgical History:  Procedure Laterality Date   CARDIAC CATHETERIZATION N/A 05/11/2015   Procedure: Left Heart Cath and Coronary Angiography;  Surgeon: Peter M JMartinique MD;  [facility];  Service: Cardiovascular;  100% pLAD (long lesion). RCA - prox 90%, mid 80%. OM2 & OM3 80% (OM2 & 3 not necessarily PCI targets); EF 35-45% with Anterio HK   CARDIAC CATHETERIZATION N/A 05/11/2015   Procedure: Coronary Stent Intervention;  Surgeon: Peter M JMartinique MD;  [facility];  Service: Cardiovascular; PCI to Prox-Mid LAD with Overlapping Promus Premier DES 3.0 x 38 & 3.0 x 16 (post-dilated to  3.5 mm)     FLEXIBLE SIGMOIDOSCOPY N/A 05/18/2015   Procedure: FLEXIBLE SIGMOIDOSCOPY;  Surgeon: Clarene Essex, MD;  Location[facility];  Service: Endoscopy;  Laterality: N/A;   NM MYOVIEW LTD  08/2016   EF 30% with large anterior-anteroseptal and apical infarct consistent with LAD infarct. His HIGH RISK because of large infarct. No ischemia.   S/P Appendectomy     Age 76   S/P Inguinal Hernia Repair     In his  20's.   TRANSTHORACIC ECHOCARDIOGRAM  05/15/2015   Mild LVH, EF 35-40% - Mod HK of mid-apical anteroseptal, anterior & apical walls.  Gr 1 DD. Mild bilateral Atrial Enlargement.   TRANSTHORACIC ECHOCARDIOGRAM  12/2015   EF up to ~40-45% wiht Anterior-Anteroapical HK.  Mod LV dilation with increased LVEDP.   TRANSTHORACIC ECHOCARDIOGRAM  12/'17; 4/'18   a) EF ~30-35% (in setting of HTN Urgency). - Gr 2 DD;; b) EF 30-35% (per Dr. Sallyanne Kuster - 35-40%). Anterior-anteroapical Akinesis. Gr2 DD w/ high filling pressures. Mild LA dilation. Mild RV dilation.   HPI:  Damon Walker is an 76 y.o. M who presented to [facility] on 9/17 as a code stroke and was found to have an acute infarct in the left parietal lobe without hemorrhage.     Presented with sudden onset global aphasia, expressive more than receptive.  Neurology was consulted.  Patient was outside the window for TNK.      Patient was admitted by [facility].  The evening of 9/18 patient was found in another hospital room in the bathroom. Returned to bed and found to be hypoxic and diaphoretic. Increasing pulmonary edema, underwent diaphoresis with improvement. Initially passed yale, but SLP ordered for swallow next day.    Assessment / Plan / Recommendation  Clinical Impression  Pt demonstrates lethargy and decreased attention to PO. Wife and daughter at bedside report they recently gave pt sips of water and he tolerated it though he squeezed the water bottle and spilled water. SLP found pt difficult to arouse, Pt wanted to stay covered and sidelying. Apparently sedation just turned off. With tactile cues and hand over hand assist pt did take sips of water from cup edge and from straw without signs of aspiration. Pt would not take bites of puree. Pt recommended to continue diet if alert and interested in PO. Arousal expected to improve given that sedation has been lifted. Suggested to family that RN given sips however given AMS. WIll f/u for diet check. Cognitive  linguistic eval deferred until pt more alert. SLP Visit Diagnosis: Dysphagia, unspecified (R13.10)    Aspiration Risk  Mild aspiration risk    Diet Recommendation Regular;Thin liquid   Liquid Administration via: Cup;Straw Medication Administration: Whole meds with liquid Supervision: Staff to assist with self feeding;Full supervision/cueing for compensatory strategies Compensations: Slow rate;Small sips/bites Postural Changes: Seated upright at 90 degrees    Other  Recommendations      Recommendations for follow up therapy are one component of a multi-disciplinary discharge planning process, led by the attending physician.  Recommendations may be updated based on patient status, additional functional criteria and insurance authorization.  Follow up Recommendations Acute inpatient rehab (3hours/day)      Assistance Recommended at Discharge Frequent or constant Supervision/Assistance  Functional Status Assessment Patient has had a recent decline in their functional status and demonstrates the ability to make significant improvements in function in a reasonable and predictable amount of time.  Frequency and Duration min 2x/week  2 weeks  Prognosis        Swallow Study   General HPI: Damon Walker is an 76 y.o. M who presented [facility] on [facility]/17 as a code stroke and was found to have an acute infarct in the left parietal lobe without hemorrhage.     Presented with sudden onset global aphasia, expressive more than receptive.  Neurology was consulted.  Patient was outside the window for TNK.      Patient was admitted by [facility]  The evening of 9/18 patient was found in another hospital room in the bathroom. Returned to bed and found to be hypoxic and diaphoretic. Increasing pulmonary edema, underwent diaphoresis with improvement. Initially passed yale, but SLP ordered for swallow next day. Type of Study: Bedside Swallow Evaluation Previous Swallow Assessment: none Diet Prior to this  Study: Regular;Thin liquids Temperature Spikes Noted: No Respiratory Status: Room air History of Recent Intubation: No Behavior/Cognition: Distractible;Requires cueing;Lethargic/Drowsy Oral Cavity Assessment: Within Functional Limits Oral Care Completed by SLP: No Oral Cavity - Dentition: Adequate natural dentition Vision: Impaired for self-feeding Self-Feeding Abilities: Needs assist Patient Positioning: Upright in bed Baseline Vocal Quality: Not observed Volitional Cough: Cognitively unable to elicit    Oral/Motor/Sensory Function Overall Oral Motor/Sensory Function: Other (comment) (appears WNL)   Ice Chips Ice chips: Not tested   Thin Liquid Thin Liquid: Impaired Presentation: Straw;Cup Oral Phase Impairments: Poor awareness of bolus    Nectar Thick Nectar Thick Liquid: Not tested   Honey Thick Honey Thick Liquid: Not tested   Puree Puree: Impaired Presentation: Spoon Oral Phase Impairments: Poor awareness of bolus   Solid     Solid: Not tested      Damon Walker, Damon Walker 04/22/2022,1:04 PM

## 2022-04-22 NOTE — Evaluation (Signed)
Occupational Therapy Evaluation Patient Details Name: Damon Walker MRN: [id number] DOB: 1945-12-23 Today's Date: 04/22/2022   History of Present Illness The pt is a 76 yo male presenting 9/17 with AMS. 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 During night of 9/17 pt found in another room's bathroom, hypoxic, and diaphoretic. Placed on Bipap due to continued respiratory distress. PMH includes: CAD, CHF, HLD, CVA of L PCA in 2016, STEMI with DES x2, and DM II.   Clinical Impression   Damon Walker was evaluated s/p the above admission list, he is typically indep at baseline without AD. Upon evaluation pt was limited by decreased LOA, his eyes were closed most of the session but spontaneously opened 2x. Pt did not attend to therapists or follow any commands. His BUE PROM was [facility] and he did sustain shoulder flexion against gravity. Per pt's wife he does have visual deficits residual from his prior stroke. PT will benefit from further acute OT assessment. Recommend d/c to AIR for maximal functional progress towards indep baseline.      Recommendations for follow up therapy are one component of a multi-disciplinary discharge planning process, led by the attending physician.  Recommendations may be updated based on patient status, additional functional criteria and insurance authorization.   Follow Up Recommendations  Acute inpatient rehab (3hours/day)    Assistance Recommended at Discharge Frequent or constant Supervision/Assistance  Patient can return home with the following A lot of help with walking and/or transfers;A lot of help with bathing/dressing/bathroom;Assistance with cooking/housework;Assistance with feeding;Direct supervision/assist for medications management;Direct supervision/assist for financial management;Assist for transportation;Help with stairs or ramp for entrance       Equipment Recommendations  None recommended by OT (defer to next venue)     Recommendations for Other Services Rehab consult     Precautions / Restrictions Precautions Precautions: Fall Precaution Comments: watch O2 on 4L O2 during session Restrictions Weight Bearing Restrictions: No      Mobility Bed Mobility Overal bed mobility: Needs Assistance             General bed mobility comments: used bed function to come to sitting in chair position. Pt with no change in alertness. VSS.    Transfers                   General transfer comment: deferred due to poor arousal      Balance Overall balance assessment: Needs assistance   Sitting balance-Leahy Scale: Zero Sitting balance - Comments: dependent on posterior support from bed at this time                                   ADL either performed or assessed with clinical judgement   ADL Overall ADL's : Needs assistance/impaired Eating/Feeding: NPO                                   Functional mobility during ADLs: Total assistance;+2 for physical assistance;+2 for safety/equipment General ADL Comments: Pt is currently total A +2 for all aspects of care at bed level     Vision Baseline Vision/History: 0 No visual deficits Vision Assessment?: Vision impaired- to be further tested in functional context Additional Comments: eyes closed 99% of session, opend brienfly 2x. Did not attend to therapists            Pertinent  Vitals/Pain Pain Assessment Pain Assessment: Faces Faces Pain Scale: Hurts a little bit Pain Location: generalized grimacing Pain Descriptors / Indicators: Discomfort, Grimacing Pain Intervention(s): Limited activity within patient's tolerance, Monitored during session     Hand Dominance Right   Extremity/Trunk Assessment Upper Extremity Assessment Upper Extremity Assessment: LUE deficits/detail;RUE deficits/detail RUE Deficits / Details: does not move to command, PROM is WFL, some grimacing with overhead movement. Pt able to move  arm against gravity. Difficult to assess due to LOA RUE Coordination: decreased fine motor;decreased gross motor LUE Deficits / Details: does not move to command, PROM is WFL, some grimacing with overhead movement. Pt able to move arm against gravity. Difficult to assess due to LOA LUE Coordination: decreased fine motor;decreased gross motor   Lower Extremity Assessment Lower Extremity Assessment: Defer to PT evaluation   Cervical / Trunk Assessment Cervical / Trunk Assessment: Other exceptions Cervical / Trunk Exceptions: pt with preference for trunk and head rotation to L. able to achieve midline with assist but pt does not maintain   Communication Communication Communication: HOH   Cognition Arousal/Alertness: Lethargic Behavior During Therapy: Flat affect Overall Cognitive Status: Difficult to assess                                 General Comments: pt with significant lethargy and not following commands this session. spontaneously opened eyes and visually tracked activity in hallway but did not open eyes or respond in any way other than grimacing to changes in position and movement of extremities.     General Comments  VSS on 4L            Home Living Family/patient expects to be discharged to:: Private residence Living Arrangements: Spouse/significant other Available Help at Discharge: Family;Available PRN/intermittently Type of Home: Apartment Home Access: Level entry     Home Layout: One level     Bathroom Shower/Tub: Teacher, early years/pre: Standard     Home Equipment: Grab bars - tub/shower          Prior Functioning/Environment Prior Level of Function : Independent/Modified Independent;Driving             Mobility Comments: pt not active "at all" per wife, but is able to drive and run errands independently, no hx of falls. does spend most of each day on computer games. retired ADLs Comments: per wife, pt is independent.  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                 OT Goals(Current goals can be found in the care plan section) Acute Rehab OT Goals Patient Stated Goal: per wife - back to indep OT Goal Formulation: With family Time For Goal Achievement: 05/06/22 Potential to Achieve Goals: Good  OT Frequency: Min 2X/week    Co-evaluation PT/OT/SLP Co-Evaluation/Treatment: Yes Reason for Co-Treatment: Complexity of the patient's impairments (multi-system involvement);Necessary to address cognition/behavior during functional activity;To address functional/ADL transfers PT goals addressed during session: Mobility/safety with mobility;Strengthening/ROM OT goals addressed during session: ADL's and self-care      AM-PAC OT "6 Clicks" Daily Activity     Outcome Measure Help from another person eating meals?: Total Help from another person taking care of personal grooming?: Total Help from another person toileting, which includes using toliet, bedpan, or urinal?: Total Help from another person bathing (including washing, rinsing, drying)?: Total Help from another person to put on  and taking off regular upper body clothing?: Total Help from another person to put on and taking off regular lower body clothing?: Total 6 Click Score: 6   End of Session Equipment Utilized During Treatment: Oxygen Nurse Communication: Mobility status  Activity Tolerance: Patient tolerated treatment well Patient left: in bed;with call bell/phone within reach;with bed alarm set;with family/visitor present  OT Visit Diagnosis: Unsteadiness on feet (R26.81);Other abnormalities of gait and mobility (R26.89);Muscle weakness (generalized) (M62.81)                Time: 7127-8718 OT Time Calculation (min): 21 min Charges:  OT General Charges $OT Visit: 1 Visit OT Evaluation $OT Eval Moderate Complexity: 1 Mod  Dula Havlik D Causey 04/22/2022, 3:32 PM

## 2022-04-22 NOTE — Progress Notes (Signed)
Inpatient Rehab Admissions Coordinator:   Per therapy recommendations, patient was screened for CIR candidacy by Clemens Catholic, MS, CCC-SLP . At this time, Pt. is not yet at a level that he is able to tolerate the intensity of CIR;  Pt. may have potential to progress to becoming a potential CIR candidate, so CIR admissions team will follow and monitor for progress and participation with therapies and place consult order if Pt. appears to be an appropriate candidate. Please contact me with any questions.   Clemens Catholic, [place], [place] Admissions Coordinator  [phone] ([place]) [phone] (office)

## 2022-04-22 NOTE — Progress Notes (Signed)
  Transition of Care [facility]) Screening Note   Patient Details  Name: Damon Walker Date of Birth: 10/25/1945   Transition of Care [facility]) CM/SW Contact:    Benard Halsted, LCSW Phone Number: 04/22/2022, 10:08 AM    Transition of Care Department [facility]) has reviewed patient. We will continue to monitor patient advancement through interdisciplinary progression rounds. If new patient transition needs arise, please place a TOC consult.

## 2022-04-22 NOTE — Consult Note (Signed)
NAME:  Damon Walker, MRN:  [id number], DOB:  07-23-1946, LOS: 0 ADMISSION DATE:  04/21/2022, CONSULTATION DATE:  9/18 REFERRING MD:  Cristal Deer FOR CONSULT:  Hypoxia   History of Present Illness:  Damon Walker is an 76 y.o. M who presented to [facility] on 9/17 as a code stroke and was found to have an acute infarct in the left parietal lobe without hemorrhage.  Presented with sudden onset global aphasia, expressive more than receptive.  Neurology was consulted.  Patient was outside the window for TNK.   Patient was admitted by [facility].  The evening of 9/18 patient was found in another hospital room in the bathroom. Returned to bed and found to be hypoxic and diaphoretic. NRB started.  ABG 7.14/77/67/26.1.  PCCM consulted for hypoxia and consideration of transfer to ICU.  Pertinent  Medical History  CAD, MI, stroke with residual right sided visual impairment, carotid stenosis with stent, CAD cardiomyopathy, hypertension, hyperlipidemia.  Significant Hospital Events: Including procedures, antibiotic start and stop dates in addition to other pertinent events   9/17 presented [facility] as a code stroke, admitted by [facility]. MRI demonstrating left parietal lobe infarct without hemorrhage 9/18 PCCM consult. Interim History / Subjective:  See above  Unable to obtain subjective evaluation due to patient status Objective   Blood pressure 122/68, pulse 84, temperature 99.1 F (37.3 C), temperature source Oral, resp. rate 18, SpO2 95 %.       No intake or output data in the 24 hours ending 04/22/22 0054 There were no vitals filed for this visit.  Examination: General: In bed, some distress, diaphoretic HEENT: MM pink/moist, anicteric, atraumatic Neuro: RASS +1, PERRL 52m, MAE, purposeful, incomprehensible, opens eyes to pain CV: S1S2, ST, no m/r/g appreciated PULM:  dimininshed air entry on left, no obvious wheezes, trachea midline, chest expansion symmetric GI: soft, bsx4  active, non-tender   Extremities: warm/wet, no pretibial edema, capillary refill less than 3 seconds  Skin:  no rashes or lesions noted  Labs/imaging ABG 7.41/77/67/26.1  Blood glucose 95 UA reviewed UDS negative COVID flu negative ethanol WNL Creatinine 1.3 (baseline 1.1-1.2) CBC reviewed INR PTT within normal limits Chest x-ray: Increasing pulmonary edema and left lobe compared to previous imaging, no obvious pneumothorax, infiltrate, or effusion  MRI brain: Acute infarct left parietal lobe without hemorrhage, Arachnoid cyst right middle cranial fossa dissecting lateral to the temporal lobe is seen on prior MRI 2016. 12 LEAD: st, some t wave abnormalities in lateral leads  Resolved Hospital Problem list     Assessment & Plan:  Acute respiratory failure with hypoxia and hypercapnia ABG 7.41/77/67/26.1. Chest x-ray: Increasing pulmonary edema and left lobe compared to previous imaging, no obvious pneumothorax, infiltrate, or effusion. Suspect secondary to HF vs flash pulmonary edema -Transfer to ICU with continuous telemetry and SPO2 monitoring -Start BiPAP.  Monitor closely for intubation. -Start Precedex at low-dose to help tolerate BiPAP.  Goal RASS 0. -Repeat ABG in 1 hr -40 mg of Lasix (20x2)  Acute infarct left parietal lobe without hemorrhage as seen on MRI 9/17 Carotid arterial disease -Management per neurology -Permissive hypertension for 48 hours BP goal less than 220/110.  -Continue aspirin 81 daily and Plavix 75 mg daily once enteral access obtained -q4h neurochecks -pt/ot/slp  History of STEMI with drug-eluting stent CAD Combined systolic and diastolic heart failure History hypertension hyperlipidemia Echo 2018 with EF of 30 to 35%.  Grade 2 diastolic dysfunction -Hold guideline directed medical therapy at this time -40 mg of  Lasix (20x2) -BiPAP as above -Check troponin -Continue Crestor once enteral access obtained  CKD 3 -Ensure renal perfusion. Goal  MAP 65 or greater. -Avoid neprotoxic drugs as possible. -Strict I&O's -Follow up AM creatinine -Place Foley  DM2 -Blood Glucose goal 140-180. -SSI -check a1c   Best Practice (right click and "Reselect all SmartList Selections" daily)   Diet/type: NPO DVT prophylaxis: SCD GI prophylaxis: PPI Lines: N/A Foley:  Yes, and it is still needed Code Status:  full code Last date of multidisciplinary goals of care discussion [pending]  Labs   CBC: Recent Labs  Lab 04/21/22 1317 04/21/22 1326  WBC 8.8  --   NEUTROABS 5.3  --   HGB 13.6 13.9  HCT 41.5 41.0  MCV 93.3  --   PLT 215  --     Basic Metabolic Panel: Recent Labs  Lab 04/21/22 1317 04/21/22 1326  NA 140 142  K 4.1 4.1  CL 107 107  CO2 25  --   GLUCOSE 99 97  BUN 16 15  CREATININE 1.30* 1.20  CALCIUM 9.3  --    GFR: CrCl cannot be calculated (Unknown ideal weight.). Recent Labs  Lab 04/21/22 1317  WBC 8.8    Liver Function Tests: Recent Labs  Lab 04/21/22 1317  AST 24  ALT 20  ALKPHOS 42  BILITOT 0.6  PROT 7.4  ALBUMIN 3.6   No results for input(s): "LIPASE", "AMYLASE" in the last 168 hours. No results for input(s): "AMMONIA" in the last 168 hours.  ABG    Component Value Date/Time   PHART 7.14 (LL) 04/22/2022 0025   PCO2ART 77 (HH) 04/22/2022 0025   PO2ART 67 (L) 04/22/2022 0025   HCO3 26.1 04/22/2022 0025   TCO2 23 04/21/2022 1326   ACIDBASEDEF 4.4 (H) 04/22/2022 0025   O2SAT 89.5 04/22/2022 0025     Coagulation Profile: Recent Labs  Lab 04/21/22 1317  INR 1.1    Cardiac Enzymes: No results for input(s): "CKTOTAL", "CKMB", "CKMBINDEX", "TROPONINI" in the last 168 hours.  HbA1C: Hgb A1c MFr Bld  Date/Time Value Ref Range Status  07/08/2016 04:51 AM 5.8 (H) 4.8 - 5.6 % Final    Comment:    (NOTE)         Pre-diabetes: 5.7 - 6.4         Diabetes: >6.4         Glycemic control for adults with diabetes: <7.0   05/15/2015 02:37 AM 6.2 (H) 4.8 - 5.6 % Final    Comment:     (NOTE)         Pre-diabetes: 5.7 - 6.4         Diabetes: >6.4         Glycemic control for adults with diabetes: <7.0     CBG: Recent Labs  Lab 04/21/22 1720  GLUCAP 95    Review of Systems:   Unable to obtain ROS due to patient status  Past Medical History:  He,  has a past medical history of Carotid arterial disease ([place]) (06/2014; 01/09/2015), 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 ([place]) (05/14/2015), Cholelithiasis with obstruction (11/2019), Chronic combined systolic and diastolic congestive heart failure, NYHA class 2 ([place]) (05/11/2015 - 05/15/15), Coronary artery disease involving native heart (05/11/2015), Essential hypertension, H/O: GI bleed (05/15/2015), Hyperlipidemia with target LDL less than 70, Ischemic cardiomyopathy (05/11/2015), Liver disease, chronic, with cirrhosis ([place]) (11/2019), Prediabetes (Oct 2016), ST elevation myocardial infarction (STEMI) involving left anterior descending (LAD) coronary artery with complication ([place]) (01/05/6947), Tobacco  abuse, Type 2 diabetes mellitus with other specified complication ([place]) (3/87/5643), and Vision abnormalities.   Surgical History:   Past Surgical History:  Procedure Laterality Date   CARDIAC CATHETERIZATION N/A 05/11/2015   Procedure: Left Heart Cath and Coronary Angiography;  Surgeon: Peter M Martinique, MD;  Location: [place] [facility];  Service: Cardiovascular;  100% pLAD (long lesion). RCA - prox 90%, mid 80%. OM2 & OM3 80% (OM2 & 3 not necessarily PCI targets); EF 35-45% with Anterio HK   CARDIAC CATHETERIZATION N/A 05/11/2015   Procedure: Coronary Stent Intervention;  Surgeon: Peter M Martinique, MD;  Location: [place] [facility];  Service: Cardiovascular; PCI to Prox-Mid LAD with Overlapping Promus Premier DES 3.0 x 38 & 3.0 x 16 (post-dilated to 3.5 mm)     FLEXIBLE SIGMOIDOSCOPY N/A 05/18/2015   Procedure: FLEXIBLE SIGMOIDOSCOPY;  Surgeon: Clarene Essex, MD;  Location: [place];  Service: Endoscopy;   Laterality: N/A;   NM MYOVIEW LTD  08/2016   EF 30% with large anterior-anteroseptal and apical infarct consistent with LAD infarct. His HIGH RISK because of large infarct. No ischemia.   S/P Appendectomy     Age 74   S/P Inguinal Hernia Repair     In his 20's.   TRANSTHORACIC ECHOCARDIOGRAM  05/15/2015   Mild LVH, EF 35-40% - Mod HK of mid-apical anteroseptal, anterior & apical walls.  Gr 1 DD. Mild bilateral Atrial Enlargement.   TRANSTHORACIC ECHOCARDIOGRAM  12/2015   EF up to ~40-45% wiht Anterior-Anteroapical HK.  Mod LV dilation with increased LVEDP.   TRANSTHORACIC ECHOCARDIOGRAM  12/'17; 4/'18   a) EF ~30-35% (in setting of HTN Urgency). - Gr 2 DD;; b) EF 30-35% (per Dr. Sallyanne Kuster - 35-40%). Anterior-anteroapical Akinesis. Gr2 DD w/ high filling pressures. Mild LA dilation. Mild RV dilation.     Social History:   reports that he quit smoking about 7 years ago. His smoking use included cigarettes. He has a 30.00 pack-year smoking history. He has been exposed to tobacco smoke. He has never used smokeless tobacco. He reports that he does not drink alcohol and does not use drugs.   Family History:  His family history includes Cancer in his mother; Other in his mother and another family member. He was adopted.   Allergies Allergies  Allergen Reactions   Latex Hives, Itching, Rash and Other (See Comments)    Blisters     Home Medications  Prior to Admission medications   Medication Sig Start Date End Date Taking? Authorizing Provider  carvedilol (COREG) 12.5 MG tablet Take 1 tablet (12.5 mg total) by mouth 2 (two) times daily. 08/08/21 04/21/22 Yes Leonie Man, MD  clopidogrel (PLAVIX) 75 MG tablet Take 1 tablet (75 mg total) by mouth daily. 05/26/15  Yes Love, Ivan Anchors, PA-C  famotidine (PEPCID) 40 MG tablet Take 40 mg by mouth every other day. 11/25/21  Yes [provider]  ferrous sulfate 325 (65 FE) MG tablet Take 325 mg by mouth daily with breakfast.   Yes [provider]  furosemide (LASIX) 20 MG tablet Take 1 tablet (20 mg total) by mouth daily as needed. Patient taking differently: Take 20 mg by mouth daily as needed for fluid or edema. 08/08/21  Yes Leonie Man, MD  losartan (COZAAR) 25 MG tablet TAKE 1 TABLET BY MOUTH ONCE DAILY AS NEEDED Patient taking differently: Take 25 mg by mouth daily as needed (for BP higher than 130). 08/08/21  Yes Leonie Man, MD  nitroGLYCERIN Delilah Shan)  0.4 MG SL tablet Place 1 tablet (0.4 mg total) under the tongue every 5 (five) minutes x 3 doses as needed for chest pain. 05/14/15  Yes Almyra Deforest, PA  pantoprazole (PROTONIX) 20 MG tablet Take 20 mg by mouth every other day.   Yes [provider]  rosuvastatin (CRESTOR) 40 MG tablet Take 1 tablet (40 mg total) by mouth daily at 6 PM. 05/14/15  Yes Almyra Deforest, PA     Critical care time: 39 minutes    The patient is critically ill with multiple organ systems failure and requires high complexity decision making for assessment and support, frequent evaluation and titration of therapies, application of advanced monitoring technologies and extensive interpretation of multiple databases.    Critical Care Time devoted to patient care services described in this note is 39 minutes. This time reflects time of care of this [place] NP. This critical care time does not reflect procedure time but could involve care discussion time with the PCCM attending.  Redmond School., MSN, APRN, AGACNP-BC [facility]  04/22/2022 , 1:39 AM  Please see [email or web address] for pager details  If no response, please call [phone] After hours, please call Elink at [phone]

## 2022-04-22 NOTE — Plan of Care (Signed)

## 2022-04-22 NOTE — Progress Notes (Signed)
Replaced Latex Foley catheter with non latex foley catheter d/t patient allergy to Latex.

## 2022-04-22 NOTE — Progress Notes (Signed)
76 year old male was admitted with a left MCA stroke after he started with aphasia, was admitted to hospitalist for secondary stroke prophylaxis, course was complicated with hypoxia/hypercapnia and increased work of breathing, was admitted to ICU overnight.  Patient remained on BiPAP, 50% FiO2 ABGs improved.  From pH 7.14 - 7.31 with improvement in PCO2 from 77-45   Physical exam: General: Acutely ill-appearing male, lying on the bed.  On BiPAP HEENT: [place]/AT, eyes anicteric.  moist mucus membranes Neuro: Opens eyes with vocal stimuli, not following commands Chest: Faint bilateral basal crackles, no wheezes or rhonchi Heart: Bradycardia cardiac, regular rhythm, no murmurs or gallops Abdomen: Soft, nontender, nondistended, bowel sounds present Skin: No rash   Assessment and plan  Acute hypoxic/hypercapnic respiratory failure Acute on chronic combined HFrEF and HFpEF S/p hypertensive emergency Demand cardiac ischemia Acute left MCA stroke Carotid artery stenosis Coronary artery disease AKI on CKD stage IIIa Diabetes type 2  Try to wean off BiPAP Continue nasal cannula oxygen Continue IV diuresis Echocardiogram is pending, last echocardiogram showed EF 30 to 35% Blood pressure is better controlled now Serum troponin slightly trended up likely due to demand cardiac ischemia from hypertensive emergency and flash pulmonary edema Continue secondary stroke prophylaxis, aspirin and rosuvastatin Monitor serum creatinine Monitor intake and output Monitor fingerstick with goal 140-180    Total additional critical care time: 32 minutes  Performed by: Jacky Kindle   Critical care time was exclusive of separately billable procedures and treating other patients.   Critical care was necessary to treat or prevent imminent or life-threatening deterioration.   Critical care was time spent personally by me on the following activities: development of treatment plan with patient and/or  surrogate as well as nursing, discussions with consultants, evaluation of patient's response to treatment, examination of patient, obtaining history from patient or surrogate, ordering and performing treatments and interventions, ordering and review of laboratory studies, ordering and review of radiographic studies, pulse oximetry and re-evaluation of patient's condition.   Jacky Kindle, MD [place] [facility] See Amion for pager If no response to pager, please call [phone] until 7pm After 7pm, Please call E-link [phone]

## 2022-04-22 NOTE — Evaluation (Addendum)
Physical Therapy Evaluation Patient Details Name: Damon Walker MRN: [id number] DOB: 03-03-1946 Today's Date: 04/22/2022  History of Present Illness  The pt is a 76 yo male presenting 9/17 with AMS. 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 During night of 9/17 pt found in another room's bathroom, hypoxic, and diaphoretic. Placed on Bipap due to continued respiratory distress. PMH includes: CAD, CHF, HLD, CVA of L PCA in 2016, STEMI with DES x2, and DM II.   Clinical Impression  Pt in bed upon arrival of PT. Prior to admission the pt was independent with all mobility without DME, but the pt's wife reports he is largely sedentary with no consistent exercise and only minimal activity during the day. The pt presents with significant lethargy at this time and was unable to follow any commands with any extremity at this time. He was transitioned to chair position in bed where he required totalA to reposition towards neutral due to pt's preference for trunk and cervical rotation to the Left. The pt did grimace with ROM to all extremities and with cervical rotation to neutral, but ROM was grossly WFL. The pt did move all extremities against gravity but not to command. Will continue to attempt and progress mobility as tolerated, will benefit from acute inpatient rehab when more alert and able to participate.         Recommendations for follow up therapy are one component of a multi-disciplinary discharge planning process, led by the attending physician.  Recommendations may be updated based on patient status, additional functional criteria and insurance authorization.  Follow Up Recommendations Acute inpatient rehab (3hours/day)      Assistance Recommended at Discharge Frequent or constant Supervision/Assistance  Patient can return home with the following  Two people to help with walking and/or transfers;Two people to help with bathing/dressing/bathroom;Assistance with  cooking/housework;Assistance with feeding;Direct supervision/assist for medications management;Direct supervision/assist for financial management;Assist for transportation;Help with stairs or ramp for entrance    Equipment Recommendations  (defer to post acute)  Recommendations for Other Services  Rehab consult (when more alert)    Functional Status Assessment Patient has had a recent decline in their functional status and demonstrates the ability to make significant improvements in function in a reasonable and predictable amount of time.     Precautions / Restrictions Precautions Precautions: Fall Precaution Comments: watch O2 on 4L O2 during session Restrictions Weight Bearing Restrictions: No      Mobility  Bed Mobility Overal bed mobility: Needs Assistance             General bed mobility comments: used bed function to come to sitting in chair position. Pt with no change in alertness. VSS.    Transfers                   General transfer comment: deferred due to poor arousal     Modified Rankin (Stroke Patients Only) Modified Rankin (Stroke Patients Only) Pre-Morbid Rankin Score: No symptoms Modified Rankin: Severe disability     Balance Overall balance assessment: Needs assistance   Sitting balance-Leahy Scale: Zero Sitting balance - Comments: dependent on posterior support from bed at this time                                     Pertinent Vitals/Pain Pain Assessment Pain Assessment: Faces Faces Pain Scale: Hurts a little bit Pain Location: generalized grimacing Pain Descriptors /  Indicators: Discomfort, Grimacing Pain Intervention(s): Limited activity within patient's tolerance, Monitored during session, Repositioned    Home Living Family/patient expects to be discharged to:: Private residence Living Arrangements: Spouse/significant other Available Help at Discharge: Family;Available PRN/intermittently (wife works full time,  can take time off) Type of Home: Apartment Home Access: Level entry       Home Layout: One level Home Equipment: Grab bars - tub/shower      Prior Function Prior Level of Function : Independent/Modified Independent;Driving             Mobility Comments: pt not active "at all" per wife, but is able to drive and run errands independently, no hx of falls. does spend most of each day on computer games. retired ADLs Comments: per wife, pt is independent. 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 WFL flexibility. able to maintain against gravity)    Cervical / Trunk Assessment Cervical / Trunk Assessment: Other exceptions Cervical / Trunk Exceptions: pt with preference for trunk and head rotation to L. able to achieve midline with assist but pt does not maintain  Communication   Communication: HOH (has hearing aides)  Cognition Arousal/Alertness: Lethargic Behavior During Therapy: Flat affect Overall Cognitive Status: Difficult to assess                                 General Comments: pt with significant lethargy and not following commands this session. spontaneously opened eyes and visually tracked activity in hallway but did not open eyes or respond in any way other than grimacing to changes in position and movement of extremities.        General Comments General comments (skin integrity, edema, etc.): VSS on 4L    Exercises     Assessment/Plan    PT Assessment Patient needs continued PT services  PT Problem List Decreased strength;Decreased range of motion;Decreased activity tolerance;Decreased balance;Decreased mobility;Decreased  coordination;Decreased cognition;Decreased safety awareness       PT Treatment Interventions DME instruction;Gait training;Stair training;Functional mobility training;Therapeutic activities;Therapeutic exercise;Balance training;Neuromuscular re-education;Cognitive remediation;Patient/family education    PT Goals (Current goals can be found in the Care Plan section)  Acute Rehab PT Goals Patient Stated Goal: per wife, to return home with independence PT Goal Formulation: With patient/family Time For Goal Achievement: 05/06/22 Potential to Achieve Goals: Good    Frequency Min 4X/week     Co-evaluation PT/OT/SLP Co-Evaluation/Treatment: Yes Reason for Co-Treatment: Complexity of the patient's impairments (multi-system involvement);Necessary to address cognition/behavior during functional activity;To address functional/ADL transfers PT goals addressed during session: Mobility/safety with mobility;Strengthening/ROM         AM-PAC PT "6 Clicks" Mobility  Outcome Measure Help needed turning from your back to your side while in a flat bed without using bedrails?: Total Help needed moving from lying on your back to sitting on the side of a flat bed without using bedrails?: Total Help needed moving to and from a bed to a chair (including a wheelchair)?: Total Help needed standing up from a chair using your arms (e.g., wheelchair or bedside chair)?: Total Help needed to walk in hospital room?: Total Help needed climbing 3-5  steps with a railing? : Total 6 Click Score: 6    End of Session Equipment Utilized During Treatment: Oxygen Activity Tolerance: Patient limited by lethargy Patient left: in bed;with call bell/phone within reach;with bed alarm set;with family/visitor present;with restraints reapplied Nurse Communication: Mobility status PT Visit Diagnosis: Other abnormalities of gait and mobility (R26.89);Muscle weakness (generalized) (M62.81)    Time: 6815-9470 PT Time  Calculation (min) (ACUTE ONLY): 21 min   Charges:   PT Evaluation $PT Eval Moderate Complexity: 1 Mod          [place] Carbo, PT, DPT   Acute Rehabilitation Department  Sandra Cockayne 04/22/2022, 2:00 PM

## 2022-04-22 NOTE — Progress Notes (Signed)
OT Cancellation Note  Patient Details Name: Muscab Brenneman MRN: [id number] DOB: [birth year]   Cancelled Treatment:    Reason Eval/Treat Not Completed: Medical issues which prohibited therapy (Per discussion with RN, hold pt this am while he attempts to transition to [place] from BiPap.)  Elliot Cousin 04/22/2022, 8:34 AM

## 2022-04-22 NOTE — Progress Notes (Signed)
Family notified of pt's transfer to ICU.

## 2022-04-22 NOTE — Significant Event (Signed)
Patient's nurse notified me that patient was getting acutely short of breath and diaphoretic and more confused.  On exam at bedside patient is alert awake but not communicative.  Patient on 100% nonrebreather.  Appears short of breath and tachypneic.  Blood pressure is 180s/80 with pulse of 107/min temperature 99.1 chest x-ray was showing mild interstitial edema with ABG of 7.1 PCO2 of 70.  I ordered 1 dose of Lasix 20 mg IV consulted pulmonary critical care.  I reviewed patient's charts labs and notes.  Patient is being moved to ICU for further care.  Notified patient's wife Mrs. Venne about patient's condition and confirmed that patient is full code.  Gean Birchwood

## 2022-04-22 NOTE — Progress Notes (Signed)
Pt's bed alarm went off pt was found not to be in the bed. Care RN and charge RN searched hall found pt sitting on toilet in clean room having a bowel movement . Pt was escorted back to room. Noted that upon laying down pt was short of breath and abdominal breathing sats were 82% on room air. 6L02 applied sats only improved to 88%.Pt now diaphoretic. Non-rebreather applied sats 90% . Provider Adrienne Mocha notified. Rapid called. Agricultural consultant at bedside. Blood glucose checked , blood glucose reading 179. Rapid team now at bedside. MD aware of blood gas .    04/21/22 2345  Vitals  BP (!) 182/80  BP Location Right Arm  BP Method Automatic  Patient Position (if appropriate) Lying  Pulse Rate 80  Pulse Rate Source Monitor  Resp (!) 24  Level of Consciousness  Level of Consciousness Alert  MEWS COLOR  MEWS Score Color Green  Oxygen Therapy  SpO2 (!) 87 %  O2 Device Room Air  MEWS Score  MEWS Temp 0  MEWS Systolic 0  MEWS Pulse 0  MEWS RR 1  MEWS LOC 0  MEWS Score 1

## 2022-04-22 NOTE — Progress Notes (Signed)
Patient taken off bipap and placed on 4L [place]. Patient tolerating well at this time. RN made aware.

## 2022-04-22 NOTE — Progress Notes (Signed)
Date and time results received: 04/22/22 0515 Test: Troponin Critical Value:118  Name of Provider Notified: Elink/CCM  Orders Received? Or Actions Taken?: awaiting new orders to acknowledge

## 2022-04-22 NOTE — Progress Notes (Addendum)
STROKE TEAM PROGRESS NOTE   INTERVAL HISTORY His wife is at the bedside. Moved to the ICU overnight due to respiratory distress. Improvement on bipap. Minimally responsive to verbal commands, pt is unable to speak. Wife notes that he was speaking "nonsense" the night prior.   Vitals:   04/22/22 0600 04/22/22 0700 04/22/22 0754 04/22/22 0756  BP: (!) 116/55 (!) 112/55    Pulse: 71 (!) 57    Resp: (!) 21 20    Temp:      TempSrc:      SpO2: 100% 100% 100% 100%  Weight:      Height:       CBC:  Recent Labs  Lab 04/21/22 1317 04/21/22 1326 04/22/22 0247  WBC 8.8  --   --   NEUTROABS 5.3  --   --   HGB 13.6 13.9 13.3  HCT 41.5 41.0 39.0  MCV 93.3  --   --   PLT 215  --   --    Basic Metabolic Panel:  Recent Labs  Lab 04/21/22 1317 04/21/22 1326 04/22/22 0151 04/22/22 0247  NA 140 142 142 140  K 4.1 4.1 4.2 4.0  CL 107 107 104  --   CO2 25  --  24  --   GLUCOSE 99 97 211*  --   BUN '16 15 17  '$ --   CREATININE 1.30* 1.20 1.63*  --   CALCIUM 9.3  --  9.1  --    Lipid Panel:  Recent Labs  Lab 04/22/22 0151  CHOL 124  TRIG 103  HDL 34*  CHOLHDL 3.6  VLDL 21  LDLCALC 69   HgbA1c:  Recent Labs  Lab 04/22/22 0151  HGBA1C 5.8*   Urine Drug Screen:  Recent Labs  Lab 04/21/22 1442  LABOPIA NONE DETECTED  COCAINSCRNUR NONE DETECTED  LABBENZ NONE DETECTED  AMPHETMU NONE DETECTED  THCU NONE DETECTED  LABBARB NONE DETECTED    Alcohol Level  Recent Labs  Lab 04/21/22 1317  ETH <10    IMAGING past 24 hours DG CHEST PORT 1 VIEW  Result Date: 04/22/2022 CLINICAL DATA:  Respiratory distress EXAM: PORTABLE CHEST 1 VIEW COMPARISON:  04/21/2022 FINDINGS: Unchanged bilateral interstitial opacities. Mild cardiomegaly. No pleural effusion or pneumothorax. No focal airspace consolidation. IMPRESSION: Unchanged bilateral interstitial opacities, possibly mild pulmonary edema superimposed on scarring. Electronically Signed   By: Ulyses Jarred M.D.   On: 04/22/2022 00:23    MR BRAIN WO CONTRAST  Result Date: 04/21/2022 CLINICAL DATA:  Stroke EXAM: MRI HEAD WITHOUT CONTRAST TECHNIQUE: Multiplanar, multiecho pulse sequences of the brain and surrounding structures were obtained without intravenous contrast. COMPARISON:  CT head 04/21/2022 FINDINGS: Brain: Patient scanned in the decubitus position. Mild motion on the study. Acute infarct left parietal lobe involving the cortex and white matter and extending to the posterior insula. Generalized atrophy. Chronic infarct left occipital lobe. Chronic infarct right parietal lobe. Chronic microvascular ischemic change in the white matter. Chronic infarcts in the thalamus bilaterally. Negative for hemorrhage CSF cyst anterior to the right temporal lobe measures 27 x 42 mm compatible with arachnoid cyst. This appears to dissect lateral to the right temporal lobe similar to the prior MRI 2016 Vascular: Negative for hyperdense vessel Skull and upper cervical spine: No focal skeletal lesion. Sinuses/Orbits: Mild mucosal edema paranasal sinuses. Bilateral cataract extraction Other: None IMPRESSION: Acute infarct left parietal lobe without hemorrhage Atrophy and chronic ischemic changes as above Arachnoid cyst right middle cranial fossa dissecting lateral to the  temporal lobe is seen on prior MRI 2016. Electronically Signed   By: Franchot Gallo M.D.   On: 04/21/2022 18:50   DG Abd 1 View  Result Date: 04/21/2022 CLINICAL DATA:  Metal screening prior to MRI. EXAM: ABDOMEN - 1 VIEW COMPARISON:  05/23/2015. FINDINGS: Normal bowel gas pattern. Residual contrast noted within nondilated intrarenal collecting systems and ureters, as well as the bladder. No radiopaque foreign body. Skeletal structures are unremarkable. IMPRESSION: 1. No radiopaque/metallic foreign body.  No contraindication to MRI. Electronically Signed   By: Lajean Manes M.D.   On: 04/21/2022 17:19   DG CHEST PORT 1 VIEW  Result Date: 04/21/2022 CLINICAL DATA:  Neurologic  deficit EXAM: PORTABLE CHEST 1 VIEW COMPARISON:  07/08/2016 FINDINGS: Two frontal views of the chest demonstrate a stable cardiac silhouette. Chronic interstitial opacities are seen throughout the lungs consistent with scarring. No acute airspace disease, effusion, or pneumothorax. No acute bony abnormalities. IMPRESSION: 1. No acute intrathoracic process. Electronically Signed   By: Randa Ngo M.D.   On: 04/21/2022 16:23   CT ANGIO HEAD NECK W WO CM W PERF (CODE STROKE)  Result Date: 04/21/2022 CLINICAL DATA:  Acute neuro deficit. EXAM: CT ANGIOGRAPHY HEAD AND NECK CT PERFUSION BRAIN TECHNIQUE: Multidetector CT imaging of the head and neck was performed using the standard protocol during bolus administration of intravenous contrast. Multiplanar CT image reconstructions and MIPs were obtained to evaluate the vascular anatomy. Carotid stenosis measurements (when applicable) are obtained utilizing NASCET criteria, using the distal internal carotid diameter as the denominator. Multiphase CT imaging of the brain was performed following IV bolus contrast injection. Subsequent parametric perfusion maps were calculated using RAPID software. RADIATION DOSE REDUCTION: This exam was performed according to the departmental dose-optimization program which includes automated exposure control, adjustment of the mA and/or kV according to patient size and/or use of iterative reconstruction technique. CONTRAST:  25m OMNIPAQUE IOHEXOL 350 MG/ML SOLN COMPARISON:  CT head 04/21/2022 FINDINGS: CTA NECK FINDINGS Aortic arch: Atherosclerotic calcification aortic arch. Atherosclerotic disease in the proximal great vessels without significant stenosis Right carotid system: Extensive atherosclerotic disease throughout the right common carotid artery and right internal carotid artery. No significant stenosis. Left carotid system: Atherosclerotic disease throughout the left common carotid artery and left internal carotid artery. 50%  diameter stenosis proximal left internal carotid artery. Vertebral arteries: Left vertebral artery dominant. Atherosclerotic calcification proximally without significant stenosis. Mild atherosclerotic calcification distal left vertebral artery. Diminutive right vertebral artery which is probably diffusely disease with minimal flow. Skeleton: No acute skeletal abnormality.  Cervical spondylosis. Other neck: Negative for mass or adenopathy in the neck. Upper chest: Lung apices clear bilaterally Review of the MIP images confirms the above findings CTA HEAD FINDINGS Anterior circulation: Atherosclerotic calcification and mild stenosis in the cavernous carotid bilaterally. Anterior and middle cerebral arteries patent bilaterally without large vessel occlusion. Mild atherosclerotic irregularity in the middle cerebral arteries bilaterally. Negative for aneurysm. Posterior circulation: Left vertebral artery is widely patent to the basilar. Diminutive right vertebral artery has mild contribution to the basilar. Basilar patent. Superior cerebellar and posterior cerebral arteries patent bilaterally without stenosis. No aneurysm. Venous sinuses: Normal venous enhancement Anatomic variants: None Review of the MIP images confirms the above findings CT Brain Perfusion Findings: ASPECTS: 10 CBF (<30%) Volume: 39ML Perfusion (Tmax>6.0s) volume: 33ML Mismatch Volume: -649mInfarction Location:Small core infarct right anterolateral temporal lobe Small core infarct and decreased perfusion in the left parietal lobe. IMPRESSION: 1. CT perfusion is abnormal bilaterally. This involves the right  temporal lobe and left parietal lobe. Recommend MRI to confirm evidence of acute infarction. 2. Negative for intracranial large vessel occlusion 3. Extensive atherosclerotic disease in the carotid bilaterally. 50% diameter stenosis proximal left internal carotid artery 4. Left vertebral artery widely patent. Diminutive right vertebral artery which  appears diffusely diseased and has minimal contribution to the basilar. Electronically Signed   By: Franchot Gallo M.D.   On: 04/21/2022 14:11   CT HEAD WO CONTRAST  Result Date: 04/21/2022 CLINICAL DATA:  Acute neuro deficit rule out stroke. EXAM: CT HEAD WITHOUT CONTRAST TECHNIQUE: Contiguous axial images were obtained from the base of the skull through the vertex without intravenous contrast. RADIATION DOSE REDUCTION: This exam was performed according to the departmental dose-optimization program which includes automated exposure control, adjustment of the mA and/or kV according to patient size and/or use of iterative reconstruction technique. COMPARISON:  MRI head 05/15/2015 FINDINGS: Brain: Negative for acute infarct, hemorrhage, mass Mild atrophy. Negative for hydrocephalus. Chronic infarct left occipital lobe and right parietal lobe. CSF density fluid anterior to the right temporal lobe measuring 24 x 41 mm unchanged from the prior study. Based on the MRI, this fluid may extend lateral to the right temporal lobe where there is prominent CSF similar to the prior study. 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 No midline shift. Vascular: Negative for hyperdense vessel Skull: Negative Sinuses/Orbits: Paranasal sinuses clear. Bilateral mastoid effusion. Bilateral middle ear effusion. Chronic mastoiditis bilaterally. Other: None IMPRESSION: No acute intracranial abnormality no change from the prior MRI. Bilateral mastoid and middle ear effusion Electronically Signed   By: Franchot Gallo M.D.   On: 04/21/2022 13:46    PHYSICAL EXAM GENERAL: Awake, alert, in no acute distress LUNGS: Normal respiratory effort. Non-labored breathing on room air CV: Regular rate and rhythm on telemetry   NEURO:  Mental Status: Lethargic but awake, unable to answer orientation questions, follows commands minimally Speech/Language: severe global aphasia, not verbalizing Cranial Nerves:  II: PERRL. Right hemianopia? III, IV, VI: EOMI. Lid  elevation symmetric and full.  V: Sensation is intact to light touch and symmetrical to face. Blinks to threat. Moves jaw back and forth.  VII: R facial droop VIII: Hearing intact to voice IX, X: Unable to assess phonation.  XII: Tongue protrudes midline without fasciculations.   Motor: 5/5 strength in all muscle groups.  Tone is normal. Bulk is normal.  Sensation: Intact to light touch bilaterally in all four extremities. No extinction to DSS present.  Coordination: UTA 2/2 inability to follow commands Gait: Deferred  ASSESSMENT/PLAN Mr. Labrandon Knoch is a 76 y.o. 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 Initial NIH 8. Patient had a rapid response on 9/18 and was emergently transferred to ICU and placed on bipap with improvement of respiratory status. Today he is minimally responsive to verbal commands, consult placed to Vascular for 50 % L ICA occlusion on CTA.   Stroke:  Acute ischemic L MCA infarct, etiology likely from left ICA stenosis/plaque  Code Stroke CT head No acute abnormality.  CTA head & neck 50% diameter stenosis proximal left internal carotid artery. Diminutive right vertebral artery which appears diffusely diseased and has minimal contribution to the basilar. MRI  Acute infarct left parietal lobe without hemorrhage 2D Echo LVEF 30-35%  Will need 30-day CardioNet monitoring to rule out A-fib as outpatient  LDL 69 HgbA1c 5.8 VTE prophylaxis - SCDs clopidogrel 75 mg daily prior to admission, now on Aspirin 81 mg and clopidogrel 75 mg Therapy recommendations: CIR Disposition:  pending  Combined systolic and diastolic heart failure Acute pulmonary edema Home meds: Coreg 12.5 mg, Losartan 25 mg, Lasix 20 mg 05/2015 EF 20 to 25% initially but improved  to 35 to 40% later 11/2016 EF 30 to 35% This admission EF 30 to 35% BP fluctuate now We will restart home medication once BP allows- Overnight developed respiratory distress, transfer to ICU, put on BiPAP, doing well, currently off BiPAP  Carotid arterial disease ([place]) Followed by vascular surgery OP Last carotid US in 10/2021 left ICA: 40-59% stenosis  This admission CT head and neck 50% left ICA stenosis Consult placed to Vascular today Continue plavix and statin   History of stroke 05/2015 admitted for confusion and right hemianopia, CT no acute abnormality.  Status post tPA, then developed GI bleeding due to recent polypectomy and tPA was reversed.  MRI showed left PCA infarct.  CTA head and neck left P2/P3 occlusion.  EF 20 to 25% but later improved to 35 to 40%.  LDL 186, A1c 6.0.  Discharged on DAPT and Crestor.  Residual right hemianopia.  However, wife stated that patient back to driving now.  Hypertension Home meds:  Losartan 25 mg BP fluctuate Long-term BP goal normotensive  Hyperlipidemia Home meds:  Crestor 40 mg, resumed in hospital LDL 69, goal < 70 Continue statin at discharge  Tobacco abuse Current smoker Smoking cessation counseling will be provided  Other Stroke Risk Factors Advanced Age >/= 37  Coronary artery disease, STEMI status post stenting  Other Active Problems AKI, creatinine 1.20-1.63, likely due to Lasix use, encourage p.o. intake, BMP monitoring  Hospital day # 0  Christene Slates, MD PGY-1   ATTENDING NOTE: I reviewed above note and agree with the assessment and plan. Pt was seen and examined.   76 year old male with history of hypertension, hyperlipidemia, prediabetes, smoker, CAD/MI status post stenting, CHF, stroke in 05/2015, left carotid stenosis admitted for global aphasia.  CT no acute abnormality.  CTA head and neck showed left ICA 50% stenosis.  MRI showed left MCA parietal moderate-sized infarct.  EF 30 to 35%.  LDL 69, A1c  5.8, UDS negative.  Creatinine 1.63.  Overnight developed respiratory distress, likely pulmonary edema, received Lasix and BiPAP, transfer ICU.  Currently off BiPAP doing well.  On exam, wife at bedside.  Patient lying in bed, still has global aphasia, nonverbal, not following commands.  Not blinking to visual threat on the right, mild right facial droop, tracking bilaterally.  Bilateral upper extremity no drift, bilateral lower extremity equal strengths.  Etiology for patient left MCA stroke could be left ICA stenosis versus CHF with low EF versus occult A-fib.  Will consult vascular surgery regarding left ICA stenosis.  Currently EF 30 to 35%, does not need anticoagulation but will do 30-day cardiac event monitoring to rule out A-fib.  Continue DAPT for now and continue statin.  Resume BP/CHF meds once BP allows.  For detailed assessment and plan, please refer to above/below as I have made changes wherever appropriate.   [name], MD PhD Stroke Neurology 04/22/2022 6:23 PM  This patient is critically ill due to left MCA stroke, respiratory distress, CHF, carotid stenosis and at significant risk of neurological worsening, death form recurrent stroke, hemorrhagic transformation, respiratory failure, heart failure, seizure. This patient's care requires constant monitoring of vital signs, hemodynamics,  respiratory and cardiac monitoring, review of multiple databases, neurological assessment, discussion with family, other specialists and medical decision making of high complexity. I spent 45 minutes of neurocritical care time in the care of this patient. I had long discussion with wife at bedside, updated pt current condition, treatment plan and potential prognosis, and answered all the questions.  She expressed understanding and appreciation.  I also discussed with CCM Dr. Tacy Learn.    To contact Stroke Continuity provider, please refer to [email or web address] After hours, contact General Neurology

## 2022-04-22 NOTE — Progress Notes (Signed)
Pt transferred to 4NICU.

## 2022-04-23 ENCOUNTER — Inpatient Hospital Stay (HOSPITAL_COMMUNITY): Payer: Medicare Other

## 2022-04-23 DIAGNOSIS — I6522 Occlusion and stenosis of left carotid artery: Secondary | ICD-10-CM

## 2022-04-23 DIAGNOSIS — I5040 Unspecified combined systolic (congestive) and diastolic (congestive) heart failure: Secondary | ICD-10-CM

## 2022-04-23 DIAGNOSIS — Z9861 Coronary angioplasty status: Secondary | ICD-10-CM

## 2022-04-23 DIAGNOSIS — I2510 Atherosclerotic heart disease of native coronary artery without angina pectoris: Secondary | ICD-10-CM | POA: Diagnosis not present

## 2022-04-23 DIAGNOSIS — J9601 Acute respiratory failure with hypoxia: Secondary | ICD-10-CM | POA: Diagnosis not present

## 2022-04-23 DIAGNOSIS — I10 Essential (primary) hypertension: Secondary | ICD-10-CM

## 2022-04-23 DIAGNOSIS — I639 Cerebral infarction, unspecified: Secondary | ICD-10-CM | POA: Diagnosis not present

## 2022-04-23 LAB — BASIC METABOLIC PANEL
Anion gap: 9 (ref 5–15)
BUN: 26 mg/dL — ABNORMAL HIGH (ref 8–23)
CO2: 25 mmol/L (ref 22–32)
Calcium: 9 mg/dL (ref 8.9–10.3)
Chloride: 107 mmol/L (ref 98–111)
Creatinine, Ser: 1.36 mg/dL — ABNORMAL HIGH (ref 0.61–1.24)
GFR, Estimated: 53 mL/min — ABNORMAL LOW (ref >60)
Glucose, Bld: 103 mg/dL — ABNORMAL HIGH (ref 70–99)
Potassium: 3.6 mmol/L (ref 3.5–5.1)
Sodium: 141 mmol/L (ref 135–145)

## 2022-04-23 LAB — CBC
HCT: 39.4 % (ref 39.0–52.0)
Hemoglobin: 12.9 g/dL — ABNORMAL LOW (ref 13.0–17.0)
MCH: 30.1 pg (ref 26.0–34.0)
MCHC: 32.7 g/dL (ref 30.0–36.0)
MCV: 92.1 fL (ref 80.0–100.0)
Platelets: 190 10*3/uL (ref 150–400)
RBC: 4.28 MIL/uL (ref 4.22–5.81)
RDW: 12.8 % (ref 11.5–15.5)
WBC: 9.2 10*3/uL (ref 4.0–10.5)
nRBC: 0 % (ref 0.0–0.2)

## 2022-04-23 LAB — GLUCOSE, CAPILLARY
Glucose-Capillary: 99 mg/dL (ref 70–99)
Glucose-Capillary: 116 mg/dL — ABNORMAL HIGH (ref 70–99)
Glucose-Capillary: 115 mg/dL — ABNORMAL HIGH (ref 70–99)
Glucose-Capillary: 99 mg/dL (ref 70–99)

## 2022-04-23 MED ORDER — FUROSEMIDE 10 MG/ML IJ SOLN
40.0000 mg | Freq: Once | INTRAMUSCULAR | Status: AC
  Administered 2022-04-23: 40 mg via INTRAVENOUS
  Filled 2022-04-23: qty 4

## 2022-04-23 MED ORDER — ENOXAPARIN SODIUM 40 MG/0.4ML IJ SOSY
40.0000 mg | PREFILLED_SYRINGE | INTRAMUSCULAR | Status: DC
  Administered 2022-04-23 – 2022-04-29 (×7): 40 mg via SUBCUTANEOUS
  Filled 2022-04-23 (×7): qty 0.4

## 2022-04-23 NOTE — Progress Notes (Signed)
NAME:  Damon Walker, MRN:  [id number], DOB:  1946-03-16, LOS: 1 ADMISSION DATE:  04/21/2022, CONSULTATION DATE:  9/18 REFERRING MD:  Cristal Deer FOR CONSULT:  Hypoxia   History of Present Illness:  Damon Walker is an 76 y.o. M who presented to [facility] on 9/17 as a code stroke and was found to have an acute infarct in the left parietal lobe without hemorrhage.  Presented with sudden onset global aphasia, expressive more than receptive.  Neurology was consulted.  Patient was outside the window for TNK.   Patient was admitted by [facility].  The evening of 9/18 patient was found in another hospital room in the bathroom. Returned to bed and found to be hypoxic and diaphoretic. NRB started.  ABG 7.14/77/67/26.1.  PCCM consulted for hypoxia and consideration of transfer to ICU.  Pertinent  Medical History  CAD, MI, stroke with residual right sided visual impairment, carotid stenosis with stent, CAD cardiomyopathy, hypertension, hyperlipidemia.  Significant Hospital Events: Including procedures, antibiotic start and stop dates in addition to other pertinent events   9/17 presented [facility] as a code stroke, admitted by [facility]. MRI demonstrating left parietal lobe infarct without hemorrhage 9/18 PCCM consult. Interim History / Subjective:   Patient is off BiPAP, remains on nasal cannula oxygen, started feeling better  Objective   Blood pressure (!) 162/71, pulse 61, temperature 97.8 F (36.6 C), temperature source Axillary, resp. rate 20, height '5\' 8"'$  (1.727 m), weight 87.5 kg, SpO2 96 %.        Intake/Output Summary (Last 24 hours) at 04/23/2022 1302 Last data filed at 04/22/2022 1800 Gross per 24 hour  Intake 92.22 ml  Output 150 ml  Net -57.78 ml   Filed Weights   04/22/22 0100  Weight: 87.5 kg    Examination: Physical exam: General: Acute on chronically ill-appearing male, lying on the bed HEENT: [place]/AT, eyes anicteric.  moist mucus membranes Neuro: Awake, aphasic,  intermittently following commands Chest: Coarse breath sounds, no wheezes or rhonchi Heart: Regular rate and rhythm, no murmurs or gallops Abdomen: Soft, nontender, nondistended, bowel sounds present Skin: No rash   Resolved Hospital Problem list     Assessment & Plan:  Acute respiratory failure with hypoxia and hypercapnia In the setting of pulmonary edema caused by acute on chronic combined HFrEF and HFpEF ABGs improved after BiPAP therapy Currently remains on nasal cannula  Continue to titrate nasal cannula oxygen with O2 sat goal 88-92%  Continue IV diuresis  Monitor intake and output  S/p hypertensive emergency Demand cardiac ischemia With pulmonary edema Blood pressure is better controlled now Echocardiogram showed no new wall motion abnormalities Ejection fraction is 35% which is stable since last 5 years  Acute left MCA stroke  Left carotid artery stenosis  Continue secondary stroke prophylaxis Consulted vascular surgery Continue antiplatelet and statin  continue aspirin 81 daily and Plavix 75 mg daily once enteral access obtained Continue PT/OT  CAD On aspirin and Plavix  Disease type II Monitor fingerstick with goal 140-180 Continue insulin  AKI on CKD 3a, likely due to cardiorenal syndrome Serum creatinine continue to improve Monitor intake and output Avoid nephrotoxic   Best Practice (right click and "Reselect all SmartList Selections" daily)   Diet/type: Regular consistency DVT prophylaxis: Subcu Lovenox GI prophylaxis: PPI Lines: N/A Foley: Discontinue Code Status:  full code Last date of multidisciplinary goals of care discussion '[]'$   Labs   CBC: Recent Labs  Lab 04/21/22 1317 04/21/22 1326 04/22/22 0247 04/22/22 1520 04/23/22 0201  WBC 8.8  --   --   --  9.2  NEUTROABS 5.3  --   --   --   --   HGB 13.6 13.9 13.3 13.3 12.9*  HCT 41.5 41.0 39.0 39.0 39.4  MCV 93.3  --   --   --  92.1  PLT 215  --   --   --  299    Basic Metabolic  Panel: Recent Labs  Lab 04/21/22 1317 04/21/22 1326 04/22/22 0151 04/22/22 0247 04/22/22 1520 04/23/22 0201  NA 140 142 142 140 142 141  K 4.1 4.1 4.2 4.0 3.3* 3.6  CL 107 107 104  --   --  107  CO2 25  --  24  --   --  25  GLUCOSE 99 97 211*  --   --  103*  BUN '16 15 17  '$ --   --  26*  CREATININE 1.30* 1.20 1.63*  --   --  1.36*  CALCIUM 9.3  --  9.1  --   --  9.0   GFR: Estimated Creatinine Clearance: 48.1 mL/min (A) (by C-G formula based on SCr of 1.36 mg/dL (H)). Recent Labs  Lab 04/21/22 1317 04/23/22 0201  WBC 8.8 9.2    Liver Function Tests: Recent Labs  Lab 04/21/22 1317  AST 24  ALT 20  ALKPHOS 42  BILITOT 0.6  PROT 7.4  ALBUMIN 3.6   No results for input(s): "LIPASE", "AMYLASE" in the last 168 hours. No results for input(s): "AMMONIA" in the last 168 hours.  ABG    Component Value Date/Time   PHART 7.460 (H) 04/22/2022 1520   PCO2ART 38.1 04/22/2022 1520   PO2ART 72 (L) 04/22/2022 1520   HCO3 27.3 04/22/2022 1520   TCO2 28 04/22/2022 1520   ACIDBASEDEF 3.0 (H) 04/22/2022 0247   O2SAT 96 04/22/2022 1520     Coagulation Profile: Recent Labs  Lab 04/21/22 1317  INR 1.1    Cardiac Enzymes: No results for input(s): "CKTOTAL", "CKMB", "CKMBINDEX", "TROPONINI" in the last 168 hours.  HbA1C: Hgb A1c MFr Bld  Date/Time Value Ref Range Status  04/22/2022 01:51 AM 5.8 (H) 4.8 - 5.6 % Final    Comment:    (NOTE) Pre diabetes:          5.7%-6.4%  Diabetes:              >6.4%  Glycemic control for   <7.0% adults with diabetes   07/08/2016 04:51 AM 5.8 (H) 4.8 - 5.6 % Final    Comment:    (NOTE)         Pre-diabetes: 5.7 - 6.4         Diabetes: >6.4         Glycemic control for adults with diabetes: <7.0     CBG: Recent Labs  Lab 04/22/22 1119 04/22/22 1724 04/22/22 2308 04/23/22 0738 04/23/22 1117  GLUCAP 93 104* 104* 99 116*       Jacky Kindle, MD [facility] See Amion for pager If no response to  pager, please call [phone] until 7pm After 7pm, Please call E-link [phone]

## 2022-04-23 NOTE — Progress Notes (Signed)
Carotid duplex exam was attempted.  Patient became belligerent/agitated and would not stop moving.  Will re attempt exam as patient condition permits.  Results can be found under chart review under CV PROC. 04/23/2022 3:33 PM Tee Richeson RVT, RDMS

## 2022-04-23 NOTE — Progress Notes (Addendum)
STROKE TEAM PROGRESS NOTE   INTERVAL HISTORY Pt evaluated at beside this AM, not cooperative on examination. RN present, mentions it has difficult to give patient regularly scheduled meds, will attempt to crush and mix them in his food. Transfer out of ICU today.  Vitals:   04/23/22 0600 04/23/22 0700 04/23/22 0800 04/23/22 0900  BP: (!) 144/122 (!) 151/66 (!) 154/72 (!) 162/71  Pulse: 67 (!) 59 (!) 58 61  Resp: '17 16 11 20  '$ Temp:   98.3 F (36.8 C)   TempSrc:   Axillary   SpO2: 96% 96% 92% 96%  Weight:      Height:       CBC:  Recent Labs  Lab 04/21/22 1317 04/21/22 1326 04/22/22 1520 04/23/22 0201  WBC 8.8  --   --  9.2  NEUTROABS 5.3  --   --   --   HGB 13.6   < > 13.3 12.9*  HCT 41.5   < > 39.0 39.4  MCV 93.3  --   --  92.1  PLT 215  --   --  190   < > = values in this interval not displayed.   Basic Metabolic Panel:  Recent Labs  Lab 04/22/22 0151 04/22/22 0247 04/22/22 1520 04/23/22 0201  NA 142   < > 142 141  K 4.2   < > 3.3* 3.6  CL 104  --   --  107  CO2 24  --   --  25  GLUCOSE 211*  --   --  103*  BUN 17  --   --  26*  CREATININE 1.63*  --   --  1.36*  CALCIUM 9.1  --   --  9.0   < > = values in this interval not displayed.   Lipid Panel:  Recent Labs  Lab 04/22/22 0151  CHOL 124  TRIG 103  HDL 34*  CHOLHDL 3.6  VLDL 21  LDLCALC 69   HgbA1c:  Recent Labs  Lab 04/22/22 0151  HGBA1C 5.8*   Urine Drug Screen:  Recent Labs  Lab 04/21/22 1442  LABOPIA NONE DETECTED  COCAINSCRNUR NONE DETECTED  LABBENZ NONE DETECTED  AMPHETMU NONE DETECTED  THCU NONE DETECTED  LABBARB NONE DETECTED    Alcohol Level  Recent Labs  Lab 04/21/22 1317  ETH <10    IMAGING past 24 hours ECHOCARDIOGRAM COMPLETE  Result Date: 04/22/2022    ECHOCARDIOGRAM REPORT   Patient Name:   Damon Walker Date of Exam: 04/22/2022 Medical Rec #:  [id number]        Height:       68.0 in Accession #:    [id number]       Weight:       192.9 lb Date of Birth:   [birth year]         BSA:          2.013 m Patient Age:    76 years         BP:           112/44 mmHg Patient Gender: M                HR:           50 bpm. Exam Location:  Inpatient Procedure: 2D Echo and Intracardiac Opacification Agent Indications:    stroke  History:        Patient has prior history of Echocardiogram examinations, most  recent 11/21/2016. Cardiomyopathy, CAD, Stroke; Risk                 Factors:Hypertension, Dyslipidemia, Diabetes and Former Smoker.  Sonographer:    Harvie Junior Referring Phys: [id number] [place] E [place]  Sonographer Comments: Technically difficult study due to poor echo windows and no subcostal window. Image acquisition challenging due to uncooperative patient and supine. IMPRESSIONS  1. Left ventricular ejection fraction, by estimation, is 30 to 35%. The left ventricle has moderately decreased function. The left ventricle demonstrates global hypokinesis. Left ventricular diastolic parameters are consistent with Grade I diastolic dysfunction (impaired relaxation). There is akinesis of the left ventricular, apical apical segment and anterior wall.  2. Right ventricular systolic function is normal. The right ventricular size is normal. There is normal pulmonary artery systolic pressure.  3. The mitral valve is normal in structure. Trivial mitral valve regurgitation. No evidence of mitral stenosis.  4. The aortic valve is normal in structure. Aortic valve regurgitation is not visualized. No aortic stenosis is present.  5. The inferior vena cava is normal in size with greater than 50% respiratory variability, suggesting right atrial pressure of 3 mmHg. Conclusion(s)/Recommendation(s): No intracardiac source of embolism detected on this transthoracic study. Consider a transesophageal echocardiogram to exclude cardiac source of embolism if clinically indicated. FINDINGS  Left Ventricle: Left ventricular ejection fraction, by estimation, is 30 to 35%. The left ventricle  has moderately decreased function. The left ventricle demonstrates global hypokinesis. The left ventricular internal cavity size was normal in size. There is no left ventricular hypertrophy. Left ventricular diastolic parameters are consistent with Grade I diastolic dysfunction (impaired relaxation). Normal left ventricular filling pressure. Right Ventricle: The right ventricular size is normal. No increase in right ventricular wall thickness. Right ventricular systolic function is normal. There is normal pulmonary artery systolic pressure. The tricuspid regurgitant velocity is 1.07 m/s, and  with an assumed right atrial pressure of 3 mmHg, the estimated right ventricular systolic pressure is 7.6 mmHg. Left Atrium: Left atrial size was normal in size. Right Atrium: Right atrial size was normal in size. Pericardium: There is no evidence of pericardial effusion. Mitral Valve: The mitral valve is normal in structure. Trivial mitral valve regurgitation. No evidence of mitral valve stenosis. Tricuspid Valve: The tricuspid valve is normal in structure. Tricuspid valve regurgitation is trivial. No evidence of tricuspid stenosis. Aortic Valve: The aortic valve is normal in structure. Aortic valve regurgitation is not visualized. No aortic stenosis is present. Aortic valve mean gradient measures 1.0 mmHg. Aortic valve peak gradient measures 2.6 mmHg. Aortic valve area, by VTI measures 4.05 cm. Pulmonic Valve: The pulmonic valve was normal in structure. Pulmonic valve regurgitation is not visualized. No evidence of pulmonic stenosis. Aorta: The aortic root is normal in size and structure. Venous: The inferior vena cava is normal in size with greater than 50% respiratory variability, suggesting right atrial pressure of 3 mmHg. IAS/Shunts: No atrial level shunt detected by color flow Doppler.  LEFT VENTRICLE PLAX 2D LVIDd:         5.00 cm      Diastology LVIDs:         4.80 cm      LV e' medial:    3.89 cm/s LV PW:          1.10 cm      LV E/e' medial:  9.5 LV IVS:        1.10 cm      LV e' lateral:  6.24 cm/s LVOT diam:     2.20 cm      LV E/e' lateral: 5.9 LV SV:         75 LV SV Index:   37 LVOT Area:     3.80 cm  LV Volumes (MOD) LV vol d, MOD A2C: 125.0 ml LV vol d, MOD A4C: 140.0 ml LV vol s, MOD A2C: 71.0 ml LV vol s, MOD A4C: 67.7 ml LV SV MOD A2C:     54.0 ml LV SV MOD A4C:     140.0 ml LV SV MOD BP:      64.2 ml RIGHT VENTRICLE RV Basal diam:  3.40 cm RV Mid diam:    2.80 cm RV S prime:     6.32 cm/s TAPSE (M-mode): 1.8 cm LEFT ATRIUM             Index        RIGHT ATRIUM           Index LA Vol (A2C):   61.0 ml 30.31 ml/m  RA Area:     13.80 cm LA Vol (A4C):   34.9 ml 17.34 ml/m  RA Volume:   29.90 ml  14.86 ml/m LA Biplane Vol: 46.0 ml 22.86 ml/m  AORTIC VALVE                    PULMONIC VALVE AV Area (Vmax):    3.78 cm     PV Vmax:       0.71 m/s AV Area (Vmean):   3.68 cm     PV Peak grad:  2.0 mmHg AV Area (VTI):     4.05 cm AV Vmax:           80.10 cm/s AV Vmean:          52.300 cm/s AV VTI:            0.186 m AV Peak Grad:      2.6 mmHg AV Mean Grad:      1.0 mmHg LVOT Vmax:         79.70 cm/s LVOT Vmean:        50.600 cm/s LVOT VTI:          0.198 m LVOT/AV VTI ratio: 1.06  AORTA Ao Root diam: 3.40 cm Ao Asc diam:  3.10 cm MITRAL VALVE               TRICUSPID VALVE MV Area (PHT): 2.14 cm    TR Peak grad:   4.6 mmHg MV Decel Time: 354 msec    TR Vmax:        107.00 cm/s MR Peak grad: 15.5 mmHg MR Vmax:      197.00 cm/s  SHUNTS MV E velocity: 37.00 cm/s  Systemic VTI:  0.20 m MV A velocity: 49.20 cm/s  Systemic Diam: 2.20 cm MV E/A ratio:  0.75 Fransico Him MD Electronically signed by Fransico Him MD Signature Date/Time: 04/22/2022/1:32:17 PM    Final     PHYSICAL EXAM GENERAL: Awake, alert, in no acute distress LUNGS: Normal respiratory effort. Non-labored breathing on room air CV: Regular rate and rhythm on telemetry   NEURO:  Mental Status: Lethargic but awake, unable to answer orientation questions,  not cooperative, did not follow any verbal commands/  Speech/Language: severe global aphasia, not verbalizing Cranial Nerves:  II: PERRL. Right hemianopia? III, IV, VI: Resistant to opening eyes. Lid elevation symmetric and full.  V: Sensation is intact to light touch and  symmetrical to face. Blinks to threat. Moves jaw back and forth.  VII: R facial droop VIII: Hearing intact to voice IX, X: Unable to assess phonation.  XII: Tongue protrudes midline without fasciculations.   Motor: 5/5 strength in all muscle groups.  Tone is normal. Bulk is normal.  Sensation: Intact to light touch bilaterally in all four extremities. No extinction to DSS present.  Coordination: UTA 2/2 inability to follow commands Gait: Deferred  ASSESSMENT/PLAN Damon Walker is a 76 y.o. 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 Initial NIH 8. Patient had a rapid response on 9/18 and was emergently transferred to ICU and placed on bipap with improvement of respiratory status. Mentation unclear today, he remains aphasic but was purposefully uncooperative during the evaluation. Consult placed to Vascular for 50 % L ICA occlusion on CTA, Dr. Tacy Learn offered to reach out to them today as well.   Stroke:  Acute ischemic L MCA infarct, etiology likely from left ICA stenosis/plaque  Code Stroke CT head No acute abnormality.  CTA head & neck 50% diameter stenosis proximal left internal carotid artery. Diminutive right vertebral artery which appears diffusely diseased and has minimal contribution to the basilar. MRI  Acute infarct left parietal lobe without hemorrhage 2D Echo LVEF 30-35%  CUS pending Will need 30-day CardioNet monitoring to rule out A-fib as outpatient LDL 69 HgbA1c 5.8 VTE prophylaxis  - SCDs clopidogrel 75 mg daily prior to admission, now on Aspirin 81 mg and clopidogrel 75 mg Therapy recommendations: Good AIR candidate per PT Disposition: pending  Combined systolic and diastolic heart failure Acute pulmonary edema Home meds: Coreg 12.5 mg, Losartan 25 mg, Lasix 20 mg 05/2015 EF 20 to 25% initially but improved to 35 to 40% later 11/2016 EF 30 to 35% This admission EF 30 to 35% Will need to restart home medication once BP allows 9/17 developed respiratory distress, transfer to ICU, put on BiPAP, doing well, now off BiPAP  Carotid arterial disease ([place]) Followed by vascular surgery OP Last carotid US in 10/2021 left ICA: 40-59% stenosis  This admission CT head and neck 50% left ICA stenosis Continue plavix and statin  Vascular surgery consulted: ordered carotid duplex. Left TCAR likely once neuro improved, Dr. Scot Dock following.   History of stroke 05/2015 admitted for confusion and right hemianopia, CT no acute abnormality.  Status post tPA, then developed GI bleeding due to recent polypectomy and tPA was reversed.  MRI showed left PCA infarct.  CTA head and neck left P2/P3 occlusion.  EF 20 to 25% but later improved to 35 to 40%.  LDL 186, A1c 6.0.  Discharged on DAPT and Crestor.  Residual right hemianopia.  However, wife stated that patient was driving prior to hospitalization.   Hypertension Home meds:  Losartan 25 mg BP fluctuate Long-term BP goal normotensive  Hyperlipidemia Home meds:  Crestor 40 mg, resumed in hospital LDL 69, goal < 70 Continue statin at discharge  Tobacco abuse Current smoker Smoking cessation counseling will be provided  Other Stroke Risk Factors Advanced Age >/= 40  Coronary artery disease, STEMI status post stenting  Other Active Problems AKI, creatinine 1.20-1.63-1.36, likely due to Lasix use, encourage p.o. intake, BMP monitoring  Hospital day # 1  Christene Slates, MD PGY-1   ATTENDING NOTE: I reviewed above  note and agree  with the assessment and plan. Pt was seen and examined.   Wife and son are at bedside.  Patient reclining in bed, nonverbal, not following commands.  Intermittently open eyes on voice, however resist forced eye opening.  Not cooperative with exam.  Overnight no acute issue, respiratory failure resolved.  PT/OT recommend CIR.  Vascular surgery consulted for possible left carotid revascularization.  Dr. Scot Dock recommend carotid Doppler to further evaluate.  Continue DAPT and statin.  Encourage p.o. intake, aggressive speech therapy.  For detailed assessment and plan, please refer to above/below as I have made changes wherever appropriate.   [name], MD PhD Stroke Neurology 04/23/2022 9:52 PM  This patient is critically ill due to left MCA stroke, pulmonary edema, CHF, carotid stenosis and at significant risk of neurological worsening, death form recurrent stroke, hemorrhagic transformation, heart failure, aspiration pneumonia. This patient's care requires constant monitoring of vital signs, hemodynamics, respiratory and cardiac monitoring, review of multiple databases, neurological assessment, discussion with family, other specialists and medical decision making of high complexity. I spent 35 minutes of neurocritical care time in the care of this patient. I had long discussion with wife and son at bedside, updated pt current condition, treatment plan and potential prognosis, and answered all the questions.  They expressed understanding and appreciation.      To contact Stroke Continuity provider, please refer to [email or web address] After hours, contact General Neurology

## 2022-04-23 NOTE — Consult Note (Addendum)
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  Currently the patient is markedly debilitated and is not following commands.  He has expressive aphasia but no clear focal weakness.  He had a known left carotid stenosis (40 to 59%) in March of this year.  CT angiogram confirms significant plaque in the left internal carotid artery.  The plaque extends quite high with calcified plaque distally.  I do not think this is surgically accessible.  The patient has a significant cardiac history with a both a history of coronary artery disease and chronic systolic and diastolic congestive heart failure.  He is not a candidate for surgery.  If he showed significant clinical improvement he could potentially be a candidate for TCAR.  He is on aspirin, Plavix, and a statin.  Carotid duplex scan could not be done today because he was moving too much.  They will try again when he is more cooperative.  Vascular surgery will follow.  [name], MD 3:43 PM   Reason for Consult:  Carotid stenosis  Requesting Physician:  Dr. Tacy Learn MRN #:  [id number]  History of Present Illness: This is a 76 y.o. male with PMH significant for CAD, MR, CVA with right eye vision loss, DM Type II, HTN, HLD, CHF who presented as a code Stroke with aphasia. Medical history obtained from chart as patient is unable to provide history and no family at bedside. He is a patient familiar to [facility] for bilateral carotid artery stenosis which we have been following since 2016. He has had known right ICA stenosis of 1-39% and left ICA stenosis of 40-59% by duplex. He has had prior left occipital lobe infarct with monocular right eye vision loss not felt to be related to his carotid stenosis. On this admission CTA head and neck showing 50% left ICA stenosis, right with minimal stenosis. 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   Past Medical History:   Diagnosis Date   Carotid arterial disease ([place]) 06/2014; 01/09/2015   Followed by Dr. Donnetta Hutching of [facility] -- a) Carotid u/s: 40-59% bilat ICA stenosis; b) 10/2019: Carotid Dopplers October 19, 1950: R ICA 9-32%, LICA 67-12%.  Right vertebral artery appears occluded.  Normal subclavian flow.  Normal left vertebral artery. 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  MRI Brain: Acute L Occipital Lobe Infarct. Chronic microvascular ischemic changes in the white matter and left pons.  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   Chronic combined systolic and diastolic congestive heart failure, NYHA class 2 ([place]) 05/11/2015 - 05/15/15   a. EF 20-25% with anterior-anteroseptal akinesis (immediately post anterior STEMI).  ; b. 10/10/'16 Echo: EF 35-40% with moderate mid-apical anteroseptal, anterior and apical hypokinesis.   Coronary artery disease involving native heart 05/11/2015   Cath: 3V CAD with 90% prox RCA, 80% mid RCA, 80% OM3, (RCA and OM residual treated medically) 100% LAD - PCI to Prox-Mid LAD with Overlapping Promus Premier DES 3.0 x 38 & 3.0 x 16 (post-dilated to 3.5 mm)    Essential hypertension    H/O: GI bleed 05/15/2015   s/p Sigmoid Polypectomy 05/04/2015 - prior to STEMI on May 11, 2015; Had GI Bleed following  TPA for CVA, while on ASA & Brilinta; Flex Sig - 1. Internal Hemorrhoids, 2. Distal sigmoid polypectomy site with flat [place] status post biopsy and tattoo with 9m spot over 3 injections 3. Proximal sigmoid polypectomy site seen as well, 4) otherwise normal with no evidence of bleed   Hyperlipidemia with target LDL less than 70    Ischemic cardiomyopathy 05/11/2015   EF 40% on cath 05/11/2015 after anterior STEMI, EF 20-25% on echo 05/13/2015; 12/2015 =   EF ~45%.   Liver disease, chronic, with cirrhosis ([facility] 11/2019   Mild possible early cirrhotic liver disease noted on CT scan.   Prediabetes Oct 2016   A1C 6.0 in Oct 2016   ST elevation myocardial infarction (STEMI) involving left anterior descending (LAD) coronary artery with complication ([facility] 119/01/2228  100% Prox LAD - PCI with overlapping DES x 2   Tobacco abuse    a. 30 yrs - 1.5 ppd. - Quit 05/11/2015   Type 2 diabetes mellitus with other specified complication ([facility] 37/98/9211  Vision abnormalities    Right hemianopsia    Past Surgical History:  Procedure Laterality Date   CARDIAC CATHETERIZATION N/A 05/11/2015   Procedure: Left Heart Cath and Coronary Angiography;  Surgeon: Peter M JMartinique MD;  [facility];  Service: Cardiovascular;  100% pLAD (long lesion). RCA - prox 90%, mid 80%. OM2 & OM3 80% (OM2 & 3 not necessarily PCI targets); EF 35-45% with Anterio HK   CARDIAC CATHETERIZATION N/A 05/11/2015   Procedure: Coronary Stent Intervention;  Surgeon: Peter M JMartinique MD;  [facility];  Service: Cardiovascular; PCI to Prox-Mid LAD with Overlapping Promus Premier DES 3.0 x 38 & 3.0 x 16 (post-dilated to 3.5 mm)     FLEXIBLE SIGMOIDOSCOPY N/A 05/18/2015   Procedure: FLEXIBLE SIGMOIDOSCOPY;  Surgeon: MClarene Essex MD;  [facility]  Service: Endoscopy;  Laterality: N/A;   NM MYOVIEW LTD  08/2016   EF 30% with large anterior-anteroseptal and apical infarct consistent with LAD infarct. His HIGH RISK because of large infarct. No ischemia.   S/P Appendectomy     Age 76  S/P Inguinal Hernia Repair     In his 20's.   TRANSTHORACIC ECHOCARDIOGRAM  05/15/2015   Mild LVH, EF 35-40% - Mod HK of mid-apical anteroseptal, anterior & apical walls.  Gr 1 DD. Mild bilateral Atrial Enlargement.   TRANSTHORACIC ECHOCARDIOGRAM  12/2015   EF up to ~40-45% wiht Anterior-Anteroapical HK.  Mod LV dilation with increased LVEDP.   TRANSTHORACIC ECHOCARDIOGRAM   12/'17; 4/'18   a) EF ~30-35% (in setting of HTN Urgency). - Gr 2 DD;; b) EF 30-35% (per Dr. CSallyanne Kuster- 35-40%). Anterior-anteroapical Akinesis. Gr2 DD w/ high filling pressures. Mild LA dilation. Mild RV dilation.    Allergies  Allergen Reactions   Latex Hives, Itching, Rash and Other (See Comments)    Blisters    Prior to Admission medications   Medication Sig Start Date End Date Taking? Authorizing Provider  carvedilol (COREG) 12.5 MG tablet Take 1 tablet (12.5 mg total) by mouth 2 (two) times daily. 08/08/21 04/21/22 Yes HLeonie Man MD  clopidogrel (PLAVIX) 75 MG tablet Take 1 tablet (75 mg total) by mouth daily. 05/26/15  Yes Love, PIvan Anchors PA-C  famotidine (PEPCID) 40 MG tablet Take 40 mg by mouth every other day. 11/25/21  Yes [provider]  ferrous sulfate 325 (65 FE) MG tablet Take 325 mg by mouth daily with breakfast.  Yes [provider]  furosemide (LASIX) 20 MG tablet Take 1 tablet (20 mg total) by mouth daily as needed. Patient taking differently: Take 20 mg by mouth daily as needed for fluid or edema. 08/08/21  Yes Leonie Man, MD  losartan (COZAAR) 25 MG tablet TAKE 1 TABLET BY MOUTH ONCE DAILY AS NEEDED Patient taking differently: Take 25 mg by mouth daily as needed (for BP higher than 130). 08/08/21  Yes Leonie Man, MD  nitroGLYCERIN (NITROSTAT) 0.4 MG SL tablet Place 1 tablet (0.4 mg total) under the tongue every 5 (five) minutes x 3 doses as needed for chest pain. 05/14/15  Yes Almyra Deforest, PA  pantoprazole (PROTONIX) 20 MG tablet Take 20 mg by mouth every other day.   Yes [provider]  rosuvastatin (CRESTOR) 40 MG tablet Take 1 tablet (40 mg total) by mouth daily at 6 PM. 05/14/15  Yes Almyra Deforest, PA    Social History   Socioeconomic History   Marital status: Married    Spouse name: Not on file   Number of children: 3   Years of education: Not on file   Highest education level: Not on file  Occupational History   Occupation:  retired from [employer] Use   Smoking status: Former    Packs/day: 1.00    Years: 30.00    Total pack years: 30.00    Types: Cigarettes    Quit date: 04/06/2015    Years since quitting: 7.0    Passive exposure: Current (Wife is a smoker)   Smokeless tobacco: Never   Tobacco comments:    Quit the day of his STEMI  Substance and Sexual Activity   Alcohol use: No    Alcohol/week: 0.0 standard drinks of alcohol   Drug use: No   Sexual activity: Not on file  Other Topics Concern   Not on file  Social History Narrative   Lives in [place] with wife. Originally from [place].   Social Determinants of Health   Financial Resource Strain: Not on file  Food Insecurity: Not on file  Transportation Needs: Not on file  Physical Activity: Not on file  Stress: Not on file  Social Connections: Not on file  Intimate Partner Violence: Not on file     Family History  Adopted: Yes  Problem Relation Age of Onset   Other Mother        eczema    Cancer Mother    Other Other        Adopted - unaware of biological parent's histories.    ROS: Otherwise negative unless mentioned in HPI  Physical Examination  Vitals:   04/23/22 1200 04/23/22 1300  BP: (!) 142/70 (!) 152/78  Pulse: 66 (!) 58  Resp: (!) 21 19  Temp: 97.8 F (36.6 C)   SpO2: 95% 100%   Body mass index is 29.33 kg/m.  General:  WDWN in NAD Gait: Not observed HENT: WNL, normocephalic Pulmonary: normal non-labored breathing Cardiac: regular, without  Murmurs,without carotid bruits Abdomen: soft, non distended Extremities: extremities are well perfused and warm, able to move spontaneously without deficits Musculoskeletal: no muscle wasting or atrophy  Neurologic: patient does not respond to commands; aphasic  CBC    Component Value Date/Time   WBC 9.2 04/23/2022 0201   RBC 4.28 04/23/2022 0201   HGB 12.9 (L) 04/23/2022 0201   HCT 39.4 04/23/2022 0201   PLT 190 04/23/2022 0201   MCV 92.1 04/23/2022  0201   MCH  30.1 04/23/2022 0201   MCHC 32.7 04/23/2022 0201   RDW 12.8 04/23/2022 0201   LYMPHSABS 1.9 04/21/2022 1317   MONOABS 0.9 04/21/2022 1317   EOSABS 0.5 04/21/2022 1317   BASOSABS 0.1 04/21/2022 1317    BMET    Component Value Date/Time   NA 141 04/23/2022 0201   NA 146 (H) 11/24/2019 1830   K 3.6 04/23/2022 0201   CL 107 04/23/2022 0201   CO2 25 04/23/2022 0201   GLUCOSE 103 (H) 04/23/2022 0201   BUN 26 (H) 04/23/2022 0201   BUN 24 11/24/2019 1830   CREATININE 1.36 (H) 04/23/2022 0201   CREATININE 1.51 (H) 09/24/2016 1554   CALCIUM 9.0 04/23/2022 0201   GFRNONAA 53 (L) 04/23/2022 0201   GFRAA 69 11/24/2019 1830    COAGS: Lab Results  Component Value Date   INR 1.1 04/21/2022   INR 1.22 06/20/2015   INR 1.11 05/14/2015     Non-Invasive Vascular Imaging:   CT Angio Head neck w wo CM w perf: IMPRESSION: 1. CT perfusion is abnormal bilaterally. This involves the right temporal lobe and left parietal lobe. Recommend MRI to confirm evidence of acute infarction. 2. Negative for intracranial large vessel occlusion 3. Extensive atherosclerotic disease in the carotid bilaterally. 50% diameter stenosis proximal left internal carotid artery 4. Left vertebral artery widely patent. Diminutive right vertebral artery which appears diffusely diseased and has minimal contribution to the basilar   MRI Head without contrast: IMPRESSION: Acute infarct left parietal lobe without hemorrhage   Atrophy and chronic ischemic changes as above   Arachnoid cyst right middle cranial fossa dissecting lateral to the temporal lobe is seen on prior MRI 2016.    Statin:  Yes.   Beta Blocker:  No. Aspirin:  Yes.   ACEI:  No. ARB:  Yes.   CCB use:  No Other antiplatelets/anticoagulants:  Yes.   Plavix   ASSESSMENT/PLAN: This is a 76 y.o. male who presents with acute parietal lobe infarct with aphasia. MRI showing acute parietal lobe infarct. CTA showing left ICA stenosis of  50%. He is also recovering from hypoxic event  yesterday with pulmonary edema. History of significant CAD and CHF. Still ruling out possible embolic source of infarct  -Will order carotid duplex for further evaluation. Last Duplex in March - Continue Aspirin, statin, Plavix - Based on CTA he will likely best be managed with left TCAR vs Carotid endarterectomy due to extent of calcification and length of disease making it not surgically accessible - The on call vascular surgeon, Dr. Scot Dock, has seen the patient as well and he will provide further management plans   [name] PA-C [facility] [phone] 04/23/2022  1:28 PM

## 2022-04-23 NOTE — Progress Notes (Signed)
Speech Language Pathology Treatment: Dysphagia  Patient Details Name: Damon Walker MRN: [id number] DOB: 10/31/1945 Today's Date: 04/23/2022 Time: 1000-1040 SLP Time Calculation (min) (ACUTE ONLY): 40 min  Assessment / Plan / Recommendation Clinical Impression  Pt positioned upright in bed for meal, awakened with tactile and auditory stimuli though pt did not attend to SLP. Pts tray set up in front of him and he picked up applesauce opened it and tried to drink it. SLP able to move applesauce to left hand and place spoon in the right hand and pt used spoon appropriately. Pt also fed himself coffee and eggs, though impulsive throughout. Pt would not pick up applesauce with crushed meds and would not be fed by SLP or wife. His arousal waxes and wanes which is part of the problem. Left meds with puree in front of pt hopeful that he will wake up and eat them. If he were more alert and were given a cup of uncrushed meds and water, he may start taking them. Unfortunately if pt cannot accept his medication he may need alternate means of nutrition though he will feed himself foods well enough. Will f/u for  HPI HPI: Damon Walker is an 76 y.o. M who presented to [facility] on 9/17 as a code stroke and was found to have an acute infarct in the left parietal lobe without hemorrhage.     Presented with sudden onset global aphasia, expressive more than receptive.  Neurology was consulted.  Patient was outside the window for TNK.      Patient was admitted by [facility].  The evening of 9/18 patient was found in another hospital room in the bathroom. Returned to bed and found to be hypoxic and diaphoretic. Increasing pulmonary edema, underwent diaphoresis with improvement. Initially passed yale, but SLP ordered for swallow next day.      SLP Plan  Continue with current plan of care  Patient needs continued Speech Lanaguage Pathology Services   Recommendations for follow up therapy are one component of a multi-disciplinary  discharge planning process, led by the attending physician.  Recommendations may be updated based on patient status, additional functional criteria and insurance authorization.    Recommendations  Diet recommendations: Regular;Thin liquid Liquids provided via: Cup;Straw Medication Administration: Other (Comment) (ok to attempt any method) Supervision: Full supervision/cueing for compensatory strategies Compensations: Slow rate;Small sips/bites Postural Changes and/or Swallow Maneuvers: Seated upright 90 degrees                Follow Up Recommendations: Acute inpatient rehab (3hours/day) Assistance recommended at discharge: Frequent or constant Supervision/Assistance SLP Visit Diagnosis: Aphasia (R47.01);Cognitive communication deficit (R41.841) Plan: Continue with current plan of care           Dontell Mian, Katherene Ponto  04/23/2022, 11:19 AM

## 2022-04-23 NOTE — Progress Notes (Signed)
Physical Therapy Treatment Patient Details Name: Damon Walker MRN: [id number] DOB: June 16, 1946 Today's Date: 04/23/2022   History of Present Illness The pt is a 76 yo male presenting 9/17 with AMS. 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 During night of 9/17 pt found in another room's bathroom, hypoxic, and diaphoretic. Placed on Bipap due to continued respiratory distress. PMH includes: CAD, CHF, HLD, CVA of L PCA in 2016, STEMI with DES x2, and DM II.    PT Comments    Pt sleeping on arrival but easy to wake. Bed transitioned to chair position. Good alertness level and pt able to pull forward to sit with min assist. Bed returned to previous position and pt transitioned supine to sit mod assist.  Pt sat EOB x 10 minutes min assist. Min assist to return to supine. Active movement noted all 4 extremities. R gaze preference. Not following commands. Nonverbal during session. Family arrived during session and no acknowledgement of recognition from pt. Wife reports pt has a residual 'blind spot' from previous stroke but unsure on which side. Pt supine in bed with HOB at 40 degrees at end of session. POC remains appropriate. Pt is a good AIR candidate.   Recommendations for follow up therapy are one component of a multi-disciplinary discharge planning process, led by the attending physician.  Recommendations may be updated based on patient status, additional functional criteria and insurance authorization.  Follow Up Recommendations  Acute inpatient rehab (3hours/day)     Assistance Recommended at Discharge Frequent or constant Supervision/Assistance  Patient can return home with the following Two people to help with walking and/or transfers;Two people to help with bathing/dressing/bathroom;Assistance with cooking/housework;Assistance with feeding;Direct supervision/assist for medications management;Direct supervision/assist for financial management;Assist for  transportation;Help with stairs or ramp for entrance   Equipment Recommendations  Other (comment) (defer to next venue)    Recommendations for Other Services Rehab consult     Precautions / Restrictions Precautions Precautions: Fall Precaution Comments: very HOH Restrictions Weight Bearing Restrictions: No     Mobility  Bed Mobility Overal bed mobility: Needs Assistance Bed Mobility: Supine to Sit, Sit to Supine     Supine to sit: Mod assist, HOB elevated Sit to supine: Min assist   General bed mobility comments: decreased initiation with supine to sit. Pt independently initiating return to supine after sitting x 10 minutes.    Transfers                   General transfer comment: deferred due to safety, global aphasia, and pt attempting to return to supine    Ambulation/Gait                   Stairs             Wheelchair Mobility    Modified Rankin (Stroke Patients Only) Modified Rankin (Stroke Patients Only) Pre-Morbid Rankin Score: No symptoms Modified Rankin: Moderately severe disability     Balance Overall balance assessment: Needs assistance Sitting-balance support: No upper extremity supported, Feet supported Sitting balance-Leahy Scale: Poor Sitting balance - Comments: min assist to maintain balance EOB x 28mnutes. Postural control: Left lateral lean                                  Cognition Arousal/Alertness: Awake/alert Behavior During Therapy: Flat affect Overall Cognitive Status: Difficult to assess  General Comments: global aphasia. Not following commands. Nonverbal during session. Eyes open with R gaze preference.        Exercises      General Comments General comments (skin integrity, edema, etc.): Pt had removed [place] upon PT entry to room with SpO2 88%. [place] replaced and SpO2 improved to 95% on 4L      Pertinent Vitals/Pain Pain Assessment Pain  Assessment: Faces Faces Pain Scale: Hurts a little bit Pain Location: generalized grimacing Pain Descriptors / Indicators: Discomfort, Grimacing Pain Intervention(s): Monitored during session, Repositioned, Limited activity within patient's tolerance    Home Living                          Prior Function            PT Goals (current goals can now be found in the care plan section) Acute Rehab PT Goals Patient Stated Goal: per wife, to return home with independence Progress towards PT goals: Progressing toward goals    Frequency    Min 4X/week      PT Plan Current plan remains appropriate    Co-evaluation              AM-PAC PT "6 Clicks" Mobility   Outcome Measure  Help needed turning from your back to your side while in a flat bed without using bedrails?: A Little Help needed moving from lying on your back to sitting on the side of a flat bed without using bedrails?: A Lot Help needed moving to and from a bed to a chair (including a wheelchair)?: Total Help needed standing up from a chair using your arms (e.g., wheelchair or bedside chair)?: Total Help needed to walk in hospital room?: Total Help needed climbing 3-5 steps with a railing? : Total 6 Click Score: 9    End of Session Equipment Utilized During Treatment: Gait belt;Oxygen Activity Tolerance: Patient tolerated treatment well Patient left: in bed;with call bell/phone within reach;with bed alarm set;with family/visitor present Nurse Communication: Mobility status PT Visit Diagnosis: Other abnormalities of gait and mobility (R26.89);Muscle weakness (generalized) (M62.81)     Time: 3710-6269 PT Time Calculation (min) (ACUTE ONLY): 24 min  Charges:  $Therapeutic Activity: 23-37 mins                     Lorrin Goodell, PT  Office # [phone] Pager [phone]    Lorriane Shire 04/23/2022, 9:07 AM

## 2022-04-23 NOTE — Evaluation (Signed)
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   Personal history of colonic polyps 10/23/2021   Personal history of other diseases of the digestive system 10/23/2021   Psoriasis 10/23/2021   Type 2 diabetes mellitus with other specified complication ([place]) 72/53/6644   Abnormal abdominal CT scan 12/26/2019   Claudication ([place]) 10/24/2019   Trochanteric bursitis of right hip 12/22/2018   CKD (chronic kidney disease), stage III ([place]) 07/08/2016   Acute respiratory failure with hypoxia ([place]) 07/08/2016   GERD (gastroesophageal reflux disease) 07/08/2016   Coronary artery disease involving native coronary artery of native heart with angina pectoris ([place]) 07/05/2015   Depression 07/05/2015   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 ([place]) 05/18/2015   Right homonymous hemianopsia 05/18/2015   Gait disturbance, post-stroke 05/18/2015   ST elevation myocardial infarction (STEMI) of anterior wall, subsequent episode of care ([place]) 05/15/2015   Lower GI bleed 05/15/2015   Carotid arterial disease ([place]) 05/11/2015   Cardiomyopathy, ischemic: EF most recently 35-40%) 05/11/2015   CAD-native artery 05/11/2015    Class: Status post   Presence of drug coated stent in LAD coronary artery 05/08/2015   Prediabetes 05/06/2015   Essential hypertension 07/05/2014   Hyperlipidemia with target LDL less than 70 07/05/2014   Former heavy cigarette smoker (20-39 per  day) 07/05/2014   Past Medical History:  Past Medical History:  Diagnosis Date   Carotid arterial disease ([place]) 06/2014; 01/09/2015   Followed by Dr. Donnetta Hutching of [facility] -- a) Carotid u/s: 40-59% bilat ICA stenosis; b) 10/2019: Carotid Dopplers October 19, 345: R ICA 4-25%, LICA 95-63%.  Right vertebral artery appears occluded.  Normal subclavian flow.  Normal left vertebral artery. 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  MRI Brain: Acute L Occipital Lobe Infarct. Chronic microvascular ischemic changes in the white matter and left pons.  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   Chronic combined systolic and diastolic congestive heart failure, NYHA class 2 ([place]) 05/11/2015 - 05/15/15   a. EF 20-25% with anterior-anteroseptal akinesis (immediately post anterior STEMI).  ; b. 10/10/'16 Echo: EF 35-40% with moderate mid-apical anteroseptal, anterior and apical hypokinesis.   Coronary artery disease involving native heart 05/11/2015   Cath: 3V CAD with 90% prox RCA, 80% mid RCA, 80% OM3, (RCA and OM residual treated medically) 100% LAD - PCI to Prox-Mid LAD with Overlapping Promus Premier DES 3.0 x 38 & 3.0 x 16 (post-dilated to 3.5 mm)    Essential hypertension    H/O: GI bleed 05/15/2015   s/p Sigmoid Polypectomy 05/04/2015 - prior to STEMI on May 11, 2015; Had GI Bleed following TPA for CVA, while on ASA & Brilinta; Flex Sig - 1. Internal Hemorrhoids, 2. Distal sigmoid polypectomy site with flat [place] status post biopsy and tattoo with 59m spot over 3 injections 3. Proximal  sigmoid polypectomy site seen as well, 4) otherwise normal with no evidence of bleed   Hyperlipidemia with target LDL less than 70    Ischemic cardiomyopathy 05/11/2015   EF 40% on cath  05/11/2015 after anterior STEMI, EF 20-25% on echo 05/13/2015; 12/2015 =  EF ~45%.   Liver disease, chronic, with cirrhosis ([place]) 11/2019   Mild possible early cirrhotic liver disease noted on CT scan.   Prediabetes Oct 2016   A1C 6.0 in Oct 2016   ST elevation myocardial infarction (STEMI) involving left anterior descending (LAD) coronary artery with complication ([place]) 16/0/7371   100% Prox LAD - PCI with overlapping DES x 2   Tobacco abuse    a. 30 yrs - 1.5 ppd. - Quit 05/11/2015   Type 2 diabetes mellitus with other specified complication ([place]) 0/62/6948   Vision abnormalities    Right hemianopsia   Past Surgical History:  Past Surgical History:  Procedure Laterality Date   CARDIAC CATHETERIZATION N/A 05/11/2015   Procedure: Left Heart Cath and Coronary Angiography;  Surgeon: Peter M Martinique, MD;  Location: [facility];  Service: Cardiovascular;  100% pLAD (long lesion). RCA - prox 90%, mid 80%. OM2 & OM3 80% (OM2 & 3 not necessarily PCI targets); EF 35-45% with Anterio HK   CARDIAC CATHETERIZATION N/A 05/11/2015   Procedure: Coronary Stent Intervention;  Surgeon: Peter M Martinique, MD;  Location: [facility];  Service: Cardiovascular; PCI to Prox-Mid LAD with Overlapping Promus Premier DES 3.0 x 38 & 3.0 x 16 (post-dilated to 3.5 mm)     FLEXIBLE SIGMOIDOSCOPY N/A 05/18/2015   Procedure: FLEXIBLE SIGMOIDOSCOPY;  Surgeon: Clarene Essex, MD;  Location: [facility];  Service: Endoscopy;  Laterality: N/A;   NM MYOVIEW LTD  08/2016   EF 30% with large anterior-anteroseptal and apical infarct consistent with LAD infarct. His HIGH RISK because of large infarct. No ischemia.   S/P Appendectomy     Age 23   S/P Inguinal Hernia Repair     In his 20's.   TRANSTHORACIC ECHOCARDIOGRAM  05/15/2015   Mild LVH, EF 35-40% - Mod HK of mid-apical anteroseptal, anterior & apical walls.  Gr 1 DD. Mild bilateral Atrial Enlargement.   TRANSTHORACIC ECHOCARDIOGRAM  12/2015   EF up to ~40-45% wiht  Anterior-Anteroapical HK.  Mod LV dilation with increased LVEDP.   TRANSTHORACIC ECHOCARDIOGRAM  12/'17; 4/'18   a) EF ~30-35% (in setting of HTN Urgency). - Gr 2 DD;; b) EF 30-35% (per Dr. Sallyanne Kuster - 35-40%). Anterior-anteroapical Akinesis. Gr2 DD w/ high filling pressures. Mild LA dilation. Mild RV dilation.   HPI:  [name] is an 75 y.o. M who [facility]17 as a code stroke and was found to have an acute infarct in the left parietal lobe without hemorrhage.     Presented with sudden onset global aphasia, expressive more than receptive.  Neurology was consulted.  Patient was outside the window for TNK.      Patient was [facility] by [facility].  The evening of 9/18 patient was found in another hospital room in the bathroom. Returned to bed and found to be hypoxic and diaphoretic. Increasing pulmonary edema, underwent diaphoresis with improvement. Initially passed yale, but SLP ordered for swallow next day.   Assessment / Plan / Recommendation Clinical Impression  Pt demosntrates severe language impairment, complicated by lethargy, decreased functional problem solving and absent awareness. Pt is resistant to any hand overhand assist, does not attend to the faces of the people  around him to attempt to interpret communicative intent. It is possible that significant visual and hearing impairment are also impacting his sensation of environment. Pt will occasionally attend to food and self feed, but problem solving is poor and pt does not look to people in environment for assist. Pt can be impulsive and safety awareness is absent. Hopeful that pt continues to be impacted by sedation that was lifted yesterday. Perhaps if arousal is sustianed pt will improve. Recommend AIR.    SLP Assessment  SLP Recommendation/Assessment: Patient needs continued Speech Lanaguage Pathology Services SLP Visit Diagnosis: Aphasia (R47.01);Cognitive communication deficit (R41.841)    Recommendations for follow up  therapy are one component of a multi-disciplinary discharge planning process, led by the attending physician.  Recommendations may be updated based on patient status, additional functional criteria and insurance authorization.    Follow Up Recommendations  Acute inpatient rehab (3hours/day)    Assistance Recommended at Discharge  Frequent or constant Supervision/Assistance  Functional Status Assessment Patient has had a recent decline in their functional status and demonstrates the ability to make significant improvements in function in a reasonable and predictable amount of time.  Frequency and Duration min 2x/week  2 weeks      SLP Evaluation Cognition  Overall Cognitive Status: Impaired/Different from baseline Arousal/Alertness: Lethargic Orientation Level: Disoriented X4 Attention: Focused;Sustained Focused Attention: Impaired Focused Attention Impairment: Verbal basic Sustained Attention: Impaired Sustained Attention Impairment: Verbal basic;Functional basic Awareness: Impaired Awareness Impairment: Intellectual impairment;Emergent impairment;Anticipatory impairment Problem Solving: Impaired Problem Solving Impairment: Functional basic;Verbal basic Behaviors: Impulsive Safety/Judgment: Impaired       Comprehension  Auditory Comprehension Overall Auditory Comprehension: Impaired Yes/No Questions: Impaired Basic Biographical Questions: 0-25% accurate Commands: Impaired One Step Basic Commands: 0-24% accurate    Expression Verbal Expression Overall Verbal Expression: Impaired Initiation: Impaired Interfering Components: Attention Written Expression Dominant Hand: Right   Oral / Motor               Laterra Lubinski, Katherene Ponto 04/23/2022, 11:15 AM

## 2022-04-24 ENCOUNTER — Inpatient Hospital Stay (HOSPITAL_COMMUNITY): Payer: Medicare Other

## 2022-04-24 DIAGNOSIS — E1169 Type 2 diabetes mellitus with other specified complication: Secondary | ICD-10-CM

## 2022-04-24 DIAGNOSIS — I2510 Atherosclerotic heart disease of native coronary artery without angina pectoris: Secondary | ICD-10-CM | POA: Diagnosis not present

## 2022-04-24 DIAGNOSIS — I5040 Unspecified combined systolic (congestive) and diastolic (congestive) heart failure: Secondary | ICD-10-CM | POA: Diagnosis not present

## 2022-04-24 DIAGNOSIS — E785 Hyperlipidemia, unspecified: Secondary | ICD-10-CM

## 2022-04-24 DIAGNOSIS — J9601 Acute respiratory failure with hypoxia: Secondary | ICD-10-CM | POA: Diagnosis not present

## 2022-04-24 DIAGNOSIS — I639 Cerebral infarction, unspecified: Secondary | ICD-10-CM | POA: Diagnosis not present

## 2022-04-24 DIAGNOSIS — I10 Essential (primary) hypertension: Secondary | ICD-10-CM

## 2022-04-24 DIAGNOSIS — I6522 Occlusion and stenosis of left carotid artery: Secondary | ICD-10-CM | POA: Diagnosis not present

## 2022-04-24 LAB — CBC
HCT: 43 % (ref 39.0–52.0)
Hemoglobin: 14.3 g/dL (ref 13.0–17.0)
MCH: 30.1 pg (ref 26.0–34.0)
MCHC: 33.3 g/dL (ref 30.0–36.0)
MCV: 90.5 fL (ref 80.0–100.0)
Platelets: 223 10*3/uL (ref 150–400)
RBC: 4.75 MIL/uL (ref 4.22–5.81)
RDW: 12.5 % (ref 11.5–15.5)
WBC: 10.1 10*3/uL (ref 4.0–10.5)
nRBC: 0 % (ref 0.0–0.2)

## 2022-04-24 LAB — GLUCOSE, CAPILLARY
Glucose-Capillary: 94 mg/dL (ref 70–99)
Glucose-Capillary: 90 mg/dL (ref 70–99)
Glucose-Capillary: 113 mg/dL — ABNORMAL HIGH (ref 70–99)
Glucose-Capillary: 104 mg/dL — ABNORMAL HIGH (ref 70–99)

## 2022-04-24 LAB — BASIC METABOLIC PANEL
Anion gap: 13 (ref 5–15)
BUN: 22 mg/dL (ref 8–23)
CO2: 26 mmol/L (ref 22–32)
Calcium: 9.1 mg/dL (ref 8.9–10.3)
Chloride: 102 mmol/L (ref 98–111)
Creatinine, Ser: 1.28 mg/dL — ABNORMAL HIGH (ref 0.61–1.24)
GFR, Estimated: 57 mL/min — ABNORMAL LOW (ref >60)
Glucose, Bld: 101 mg/dL — ABNORMAL HIGH (ref 70–99)
Potassium: 3.5 mmol/L (ref 3.5–5.1)
Sodium: 141 mmol/L (ref 135–145)

## 2022-04-24 MED ORDER — QUETIAPINE FUMARATE 25 MG PO TABS
25.0000 mg | ORAL_TABLET | Freq: Every day | ORAL | Status: DC
  Administered 2022-04-25: 25 mg via ORAL
  Filled 2022-04-24 (×2): qty 1

## 2022-04-24 NOTE — Progress Notes (Signed)
Inpatient Rehab Admissions Coordinator:   Per therapy recommendations, patient was screened for CIR candidacy by Clemens Catholic, MS, CCC-SLP . At this time, Pt. Continues to be unable to tolerate intensity of CIR; however,   Pt. may have potential to progress to becoming a potential CIR candidate, so CIR admissions team will follow and monitor for progress and participation with therapies and place consult order if Pt. appears to be an appropriate candidate. Please contact me with any questions.    Clemens Catholic, [place], [place] Admissions Coordinator  [phone] ([place]) [phone] (office)

## 2022-04-24 NOTE — Progress Notes (Addendum)
  Progress Note  VASCULAR SURGERY ASSESSMENT & PLAN:   SYMPTOMATIC LEFT CAROTID STENOSIS: This patient has a significant left carotid stenosis and had a left parietal infarct.  He was quite agitated yesterday and not following commands.  Today he seems much better and asked for some water.  He does not appear to be a candidate for a TCAR.  If he continues to make improvement clinically we could potentially schedule him for a TCAR next Tuesday.  He is on aspirin, Plavix, and a statin.  They are going to try again today to do his carotid duplex scan.  [name], MD 10:45 AM   04/24/2022 9:37 AM * No surgery found *  Subjective:  no overnight changes. Patient unable to cooperate on exam   Vitals:   04/24/22 0700 04/24/22 0800  BP: (!) 133/97 124/71  Pulse: 81 (!) 54  Resp: 17 16  Temp:  98.2 F (36.8 C)  SpO2: 94% 95%   Physical Exam: Cardiac:  regular Lungs:  non labored Extremities:  can move spontaneously Neurologic: patient with eyes closed, non verbal, does not wake up to voice, not cooperative on exam and unable to follow commands  CBC    Component Value Date/Time   WBC 10.1 04/24/2022 0244   RBC 4.75 04/24/2022 0244   HGB 14.3 04/24/2022 0244   HCT 43.0 04/24/2022 0244   PLT 223 04/24/2022 0244   MCV 90.5 04/24/2022 0244   MCH 30.1 04/24/2022 0244   MCHC 33.3 04/24/2022 0244   RDW 12.5 04/24/2022 0244   LYMPHSABS 1.9 04/21/2022 1317   MONOABS 0.9 04/21/2022 1317   EOSABS 0.5 04/21/2022 1317   BASOSABS 0.1 04/21/2022 1317    BMET    Component Value Date/Time   NA 141 04/24/2022 0244   NA 146 (H) 11/24/2019 1830   K 3.5 04/24/2022 0244   CL 102 04/24/2022 0244   CO2 26 04/24/2022 0244   GLUCOSE 101 (H) 04/24/2022 0244   BUN 22 04/24/2022 0244   BUN 24 11/24/2019 1830   CREATININE 1.28 (H) 04/24/2022 0244   CREATININE 1.51 (H) 09/24/2016 1554   CALCIUM 9.1 04/24/2022 0244   GFRNONAA 57 (L) 04/24/2022 0244   GFRAA 69 11/24/2019 1830    INR     Component Value Date/Time   INR 1.1 04/21/2022 1317     Intake/Output Summary (Last 24 hours) at 04/24/2022 0937 Last data filed at 04/24/2022 0600 Gross per 24 hour  Intake --  Output 1900 ml  Net -1900 ml     Assessment/Plan:  76 y.o. male admitted with aphasia secondary to left parietal lobe infarct. Left ICA stenosis on CT. Ultrasound was unable to be completed yesterday due to pt agitation. Will make second attempt this afternoon. Clinically no change since yesterday. Potential candidate for Left TCAR if shows significant clinical improvement. Continue Aspirin, Statin, Plavix.    [name], PA-C [facility] [phone] 04/24/2022 9:37 AM  I have interviewed the patient and examined the patient. I agree with the findings by the PA.  [name], MD

## 2022-04-24 NOTE — Progress Notes (Signed)
Physical Therapy Treatment Patient Details Name: Damon Walker MRN: [id number] DOB: Aug 14, 1945 Today's Date: 04/24/2022   History of Present Illness The pt is a 76 yo male presenting 9/17 with AMS. 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 During night of 9/17 pt found in another room's bathroom, hypoxic, and diaphoretic. Placed on Bipap due to continued respiratory distress. PMH includes: CAD, CHF, HLD, CVA of L PCA in 2016, STEMI with DES x2, and DM II.    PT Comments    Pt continues to be limited in mobility progression by his deficits in expressive and receptive communication, needing extensive multi-modal cues for all tasks and max-TA to initiate all tasks before pt would comprehend and actively participate. He did state a few words today, but then remained nonverbal the remainder of the session. He continues to prefer to look to the R, but is able to look to the L if desired. He tends to avoid eye contact with therapist though. He was able to progress to standing 2x from EOB with TA to initiate but maxA to complete and maintain standing balance with bil knee block. Will continue to follow acutely. Current recommendations remain appropriate.     Recommendations for follow up therapy are one component of a multi-disciplinary discharge planning process, led by the attending physician.  Recommendations may be updated based on patient status, additional functional criteria and insurance authorization.  Follow Up Recommendations  Acute inpatient rehab (3hours/day)     Assistance Recommended at Discharge Frequent or constant Supervision/Assistance  Patient can return home with the following Two people to help with walking and/or transfers;Two people to help with bathing/dressing/bathroom;Assistance with cooking/housework;Assistance with feeding;Direct supervision/assist for medications management;Direct supervision/assist for financial management;Assist for  transportation;Help with stairs or ramp for entrance   Equipment Recommendations  Other (comment) (defer to next venue)    Recommendations for Other Services       Precautions / Restrictions Precautions Precautions: Fall Precaution Comments: very HOH; posey belt Restrictions Weight Bearing Restrictions: No     Mobility  Bed Mobility Overal bed mobility: Needs Assistance Bed Mobility: Sidelying to Sit, Sit to Supine   Sidelying to sit: Max assist, HOB elevated   Sit to supine: Max assist, HOB elevated   General bed mobility comments: Pt with poor understanding of verbal, visual gestures, and tactile cues to sit up from L sidelying or return to supine from sit, needing maxA to initiate.    Transfers Overall transfer level: Needs assistance Equipment used: 1 person hand held assist Transfers: Sit to/from Stand Sit to Stand: Total assist, Max assist           General transfer comment: TA with bil knee block to slide pt anteriorly to get his feet on ground and initiate transfers to stand. Once placed on his feet and provided an anterior weight shift cue, pt would extend legs to stand but maintained a partially flexed stance, needing maxA to maintain balance and complete transfer to stand. x2 reps from EOB    Ambulation/Gait               General Gait Details: unable, even when provided max cues and assistance   Stairs             Wheelchair Mobility    Modified Rankin (Stroke Patients Only) Modified Rankin (Stroke Patients Only) Pre-Morbid Rankin Score: No symptoms Modified Rankin: Severe disability     Balance Overall balance assessment: Needs assistance Sitting-balance support: No upper extremity  supported, Feet supported Sitting balance-Leahy Scale: Fair Sitting balance - Comments: Min guard for static sitting balance EOB Postural control: Posterior lean Standing balance support: Single extremity supported, No upper extremity supported, During  functional activity Standing balance-Leahy Scale: Poor Standing balance comment: Reliant on maxA and bil knee block to stand 2x ~30-60 sec each and prevent posterior lean, cuing for upright posture.                            Cognition Arousal/Alertness: Awake/alert Behavior During Therapy: Flat affect Overall Cognitive Status: Difficult to assess                                 General Comments: global aphasia. Not following commands. Pt reported something along the lines of "how about we" then stopped and did not speak any further during session. Eyes open with R gaze preference, turning head to R often and avoiding eye contact. Poor comprehension to initiate tasks, needing repeated max multi-modal simple cues and TA to initiate tasks but then pt would actively participate        Exercises      General Comments General comments (skin integrity, edema, etc.): SpO2 dropping briefly to 80s% on RA when standing, but unsure of reliability as it would quickly improve to mid-90s% with good pleth, re-donned 2L [place] end of session      Pertinent Vitals/Pain Pain Assessment Pain Assessment: Faces Faces Pain Scale: Hurts a little bit Pain Location: generalized grimacing Pain Descriptors / Indicators: Discomfort, Grimacing Pain Intervention(s): Limited activity within patient's tolerance, Monitored during session, Repositioned    Home Living                          Prior Function            PT Goals (current goals can now be found in the care plan section) Acute Rehab PT Goals Patient Stated Goal: did not state PT Goal Formulation: Patient unable to participate in goal setting Time For Goal Achievement: 05/06/22 Potential to Achieve Goals: Good Progress towards PT goals: Progressing toward goals    Frequency    Min 4X/week      PT Plan Current plan remains appropriate    Co-evaluation              AM-PAC PT "6 Clicks" Mobility    Outcome Measure  Help needed turning from your back to your side while in a flat bed without using bedrails?: A Little Help needed moving from lying on your back to sitting on the side of a flat bed without using bedrails?: A Lot Help needed moving to and from a bed to a chair (including a wheelchair)?: Total Help needed standing up from a chair using your arms (e.g., wheelchair or bedside chair)?: A Lot Help needed to walk in hospital room?: Total Help needed climbing 3-5 steps with a railing? : Total 6 Click Score: 10    End of Session Equipment Utilized During Treatment: Gait belt;Oxygen Activity Tolerance: Patient tolerated treatment well Patient left: in bed;with call bell/phone within reach;with bed alarm set;with restraints reapplied Nurse Communication: Mobility status PT Visit Diagnosis: Other abnormalities of gait and mobility (R26.89);Muscle weakness (generalized) (M62.81);Unsteadiness on feet (R26.81);Difficulty in walking, not elsewhere classified (R26.2);Other symptoms and signs involving the nervous system (J62.831)     Time: 5176-1607 PT Time  Calculation (min) (ACUTE ONLY): 21 min  Charges:  $Therapeutic Activity: 8-22 mins                     Moishe Spice, PT, DPT Acute Rehabilitation Services  Office: [phone]    Orvan Falconer 04/24/2022, 9:06 AM

## 2022-04-24 NOTE — Progress Notes (Signed)
Second attempt carotid artery duplex- patient lying on left side in a significantly contracted position. Attempted to arouse patient to lie flat for exam, or even expose neck, but patient remains uncooperative. RN [place] witnessed this attempt. Will place order on hold- please notify us if/when patient becomes amenable to exam.  04/24/2022 3:19 PM Kelby Aline., MHA, RVT, RDCS, RDMS

## 2022-04-24 NOTE — Progress Notes (Signed)
PROGRESS NOTE    Damon Walker  YKD:[id number] DOB: 1946-02-15 DOA: 04/21/2022 PCP: [name], MD    Brief Narrative:  Damon Walker is a 76 y.o. male with past medical history significant of carotid artery disease, hx of CVA, combined systolic and diastolic CHF, history of carotid artery stenosis hypertension, hyperlipidemia, type 2 diabetes, CKD stage III, who presented to hospital with difficulty speech, some confusion and not acting himself.  Patient was then brought into the hospital.  Code stroke was activated.  In the ED blood pressure was slightly elevated.  Patient had global aphasia.  He was out of the window for TNK.  Creatinine was 1.3.  CT head scan did not show any acute findings.  CTA head and neck showed perfusion abnormality in the right temporal and left parietal lobe.  No large vessel occlusion was noted but carotid stenosis noted.  MRI of the brain showed left parietal lobe infarct without hemorrhage.  Patient was then admitted hospital for further evaluation and treatment.  On the day of admission patient was confused and was noted to be hypoxic and diaphoretic so was transferred to the ICU.  ABG showed a pH of 7.1.  Patient was put on nonrebreather mask initially.  He was put on BiPAP subsequently.  BiPAP has been weaned off and patient has been transferred back to medical service at this time.     Assessment and Plan: * Acute CVA (cerebrovascular accident) ([place]) MRI brain spine left parietal lobe infarct without hemorrhage.  Neurology on board.  CTA neck showed significant carotid stenosis and significant plaque in the left internal carotid artery so carotid duplex was attempted twice but was unable to be completed due to patient's uncooperativeness. Patient is currently on aspirin Plavix and Crestor 40 mg daily.  History of CVA in the past.  Speech therapy has recommended regular consistency diet.  Confusion and disorientation possible delirium.  Patient has been  started on Seroquel.  On restraints.  He appears to be more alert awake when the family is around and this morning.  He was however unable to comply for carotid duplex ultrasound.  Carotid arterial disease ([place]) Patient had carotid ultrasound on 10/2021 showed: right ICA 1-39% stenosis, left ICA: 40-59% stenosis.  CTA at this time showed extensive atherosclerotic disease in the carotid bilaterally and 50% stenosis in the proximal left internal carotid artery.  Vascular surgery on board and wanting to do a carotid duplex but unable to perform due to uncooperativeness x2.  Continue aspirin and Plavix.  As for vascular surgery patient is not a candidate for surgery but potential for TCAR.  History of coronary artery disease-STEMI post drug-eluting stent in the past. Continue statins.  On Coreg at home currently on hold.  Combined systolic and diastolic heart failure (HCC) Currently euvolemic.  On Lasix at home at 20 mg daily as needed.  Review of 2D echocardiogram from 04/22/2022 shows LV ejection fraction of 30 to 35% with global hypokinesis and grade 1 diastolic dysfunction and akinesis of the left ventricular apical segment of anterior wall.  Type 2 diabetes mellitus with other specified complication (HCC) Controlled.  Hemoglobin A1c from 04/22/2022 at 5.8.  CKD (chronic kidney disease), stage III (HCC) Creatinine likely at baseline.  Creatinine today at 1.2.  Hyperlipidemia with target LDL less than 70 Continue crestor.  Lipid panel from 04/22/2022 shows LDL of 69.  Essential hypertension Patient was on Coreg at home.  Patient was  on permissive hypertension for the first 48 hours.  Latest blood pressure of 126/64.  Resume beta-blocker by tomorrow.    DVT prophylaxis: enoxaparin (LOVENOX) injection 40 mg Start: 04/23/22 1500 SCDs Start: 04/23/22 1319 SCD's Start: 04/21/22 1606   Code Status:     Code Status: Full Code  Disposition:  CIR as per PT evaluation.    Status is:  Inpatient Remains inpatient appropriate because: Significant carotid stenosis with stroke, possible need for vascular intervention,   Family Communication: Spoke with the patient's family at bedside.  Consultants:  Neurology Vascular surgery  Procedures:  None  Antimicrobials:  None  Anti-infectives (From admission, onward)    None        Subjective: Today, patient was seen and examined at bedside.  Trying to verbalize some with the family.  Unable to answer appropriately.  On chest vest restraints.  Better today in terms of confusion as per the family.  Objective: Vitals:   04/24/22 1100 04/24/22 1159 04/24/22 1200 04/24/22 1300  BP: (!) 140/60  123/66 126/64  Pulse:      Resp: 20  20 (!) 22  Temp:  (!) 97 F (36.1 C)    TempSrc:  Axillary    SpO2: 95%  96% 96%  Weight:      Height:        Intake/Output Summary (Last 24 hours) at 04/24/2022 1528 Last data filed at 04/24/2022 1200 Gross per 24 hour  Intake --  Output 1400 ml  Net -1400 ml   Filed Weights   04/22/22 0100  Weight: 87.5 kg    Physical Examination: Body mass index is 29.33 kg/m.   General:  Average built, not in obvious distress, mildly anxious, mildly verbal but has aphasia, hard of hearing, on vest restraints HENT:   No scleral pallor or icterus noted. Oral mucosa is moist.  Chest:  Clear breath sounds.  Diminished breath sounds bilaterally. No crackles or wheezes.  CVS: S1 &S2 heard. No murmur.  Regular rate and rhythm. Abdomen: Soft, nontender, nondistended.  Bowel sounds are heard.   Extremities: No cyanosis, clubbing or edema.  Peripheral pulses are palpable. Psych: Alert, awake hard of hearing, aphasia, asking questions but answering differently, appears to be mildly agitated at times, CNS:  aphasia, moves extremities Skin: Warm and dry.  No rashes noted.  Data Reviewed:   CBC: Recent Labs  Lab 04/21/22 1317 04/21/22 1326 04/22/22 0247 04/22/22 1520 04/23/22 0201  04/24/22 0244  WBC 8.8  --   --   --  9.2 10.1  NEUTROABS 5.3  --   --   --   --   --   HGB 13.6 13.9 13.3 13.3 12.9* 14.3  HCT 41.5 41.0 39.0 39.0 39.4 43.0  MCV 93.3  --   --   --  92.1 90.5  PLT 215  --   --   --  190 267    Basic Metabolic Panel: Recent Labs  Lab 04/21/22 1317 04/21/22 1326 04/22/22 0151 04/22/22 0247 04/22/22 1520 04/23/22 0201 04/24/22 0244  NA 140 142 142 140 142 141 141  K 4.1 4.1 4.2 4.0 3.3* 3.6 3.5  CL 107 107 104  --   --  107 102  CO2 25  --  24  --   --  25 26  GLUCOSE 99 97 211*  --   --  103* 101*  BUN '16 15 17  '$ --   --  26* 22  CREATININE 1.30* 1.20 1.63*  --   --  1.36* 1.28*  CALCIUM  9.3  --  9.1  --   --  9.0 9.1    Liver Function Tests: Recent Labs  Lab 04/21/22 1317  AST 24  ALT 20  ALKPHOS 42  BILITOT 0.6  PROT 7.4  ALBUMIN 3.6     Radiology Studies: No results found.    LOS: 2 days    Flora Lipps, MD [facility] Available via Epic secure chat 7am-7pm After these hours, please refer to coverage provider listed on [email or web address] 04/24/2022, 3:28 PM

## 2022-04-24 NOTE — Progress Notes (Addendum)
STROKE TEAM PROGRESS NOTE   INTERVAL HISTORY Patient evaluated at bedside this AM, family present. Patient is alert and awake today, more cooperative during the evaluation. Family mentions patient said he felt he was "imprisoned" yesterday, did not understand why he was hospitalized.  Family notes patient's appetite has been poor since yesterday evening. Discussed ordering bedtime Seroquel to help with sleep and continue to monitor oral intake, consider IV fluids if he doesn't meet nutritional needs. Patient was able to speak, intermittently coherent but mostly word salad. Also had difficulty following verbal commands. Pending transfer out of ICU once bed is available.  Vitals:   04/24/22 0637 04/24/22 0700 04/24/22 0800 04/24/22 1000  BP: (!) 141/111 (!) 133/97 124/71 (!) 140/69  Pulse: 84 81 (!) 54 71  Resp: '20 17 16 15  '$ Temp:   98.2 F (36.8 C)   TempSrc:   Axillary   SpO2: 94% 94% 95% 97%  Weight:      Height:       CBC:  Recent Labs  Lab 04/21/22 1317 04/21/22 1326 04/23/22 0201 04/24/22 0244  WBC 8.8  --  9.2 10.1  NEUTROABS 5.3  --   --   --   HGB 13.6   < > 12.9* 14.3  HCT 41.5   < > 39.4 43.0  MCV 93.3  --  92.1 90.5  PLT 215  --  190 223   < > = values in this interval not displayed.   Basic Metabolic Panel:  Recent Labs  Lab 04/23/22 0201 04/24/22 0244  NA 141 141  K 3.6 3.5  CL 107 102  CO2 25 26  GLUCOSE 103* 101*  BUN 26* 22  CREATININE 1.36* 1.28*  CALCIUM 9.0 9.1   Lipid Panel:  Recent Labs  Lab 04/22/22 0151  CHOL 124  TRIG 103  HDL 34*  CHOLHDL 3.6  VLDL 21  LDLCALC 69   HgbA1c:  Recent Labs  Lab 04/22/22 0151  HGBA1C 5.8*   Urine Drug Screen:  Recent Labs  Lab 04/21/22 1442  LABOPIA NONE DETECTED  COCAINSCRNUR NONE DETECTED  LABBENZ NONE DETECTED  AMPHETMU NONE DETECTED  THCU NONE DETECTED  LABBARB NONE DETECTED    Alcohol Level  Recent Labs  Lab 04/21/22 1317  ETH <10    PHYSICAL EXAM GENERAL: Awake, alert, in no  acute distress LUNGS: Normal respiratory effort. Non-labored breathing on room air CV: Regular rate and rhythm on telemetry   NEURO:  Mental Status: Awake, unable to answer orientation questions, intermittently followed verbal commands.  Speech/Language: receptive aphasia, is able to say 1-3 word coherent sentences w/ word salad intermittently.  Cranial Nerves:  II: PERRL. Right hemianopia? III, IV, VI: Eyes open today. Lid elevation symmetric and full.  V: Sensation is intact to light touch and symmetrical to face. Blinks to threat. Moves jaw back and forth.  VII: R facial droop VIII: Hearing intact to voice IX, X: Phonation intatc XII: Tongue protrudes midline without fasciculations.   Motor: 5/5 strength in all muscle groups.  Tone is normal. Bulk is normal.  Sensation: Intact to light touch bilaterally in all four extremities. No extinction to DSS present.  Coordination: UTA 2/2 inability to follow commands Gait: Deferred  ASSESSMENT/PLAN Damon Walker is a 76 y.o. 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  Initial NIH 8. Patient had a rapid response on 9/18 and was emergently transferred to ICU and placed on bipap with improvement of respiratory status.  Patient appears more alert and awake today, mentation remains unclear, here is still some receptive aphasia and word salad, more cooperative this morning during the evaluation. Consult placed to Vascular for 50 % L ICA occlusion on CTA, they were unable to do carotid duplex scan yesterday, they will try again.   Stroke:  Acute ischemic L MCA infarct, etiology likely from left ICA stenosis/plaque  Code Stroke CT head No acute abnormality.  CTA head & neck 50% diameter stenosis proximal left internal carotid  artery. Diminutive right vertebral artery which appears diffusely diseased and has minimal contribution to the basilar. MRI  Acute infarct left parietal lobe without hemorrhage 2D Echo LVEF 30-35%  CUS pending Will need 30-day CardioNet monitoring to rule out A-fib as outpatient LDL 69 HgbA1c 5.8 VTE prophylaxis - SCDs clopidogrel 75 mg daily prior to admission, now on Aspirin 81 mg and clopidogrel 75 mg Therapy recommendations: Good AIR candidate per PT Disposition: pending  Combined systolic and diastolic heart failure Acute pulmonary edema Home meds: Coreg 12.5 mg, Losartan 25 mg, Lasix 20 mg 05/2015 EF 20 to 25% initially but improved to 35 to 40% later 11/2016 EF 30 to 35% Pt has declined ICD in the past with Dr. Ellyn Hack This admission EF 30 to 35% Will need to restart home medication once BP allows 9/17 developed respiratory distress, transfer to ICU, put on BiPAP, doing well, now off BiPAP  Carotid arterial disease ([place]) Followed by vascular surgery OP Last carotid US in 10/2021 left ICA: 40-59% stenosis  This admission CT head and neck 50% left ICA stenosis Continue plavix and statin  Vascular surgery consulted: ordered carotid duplex. Left TCAR if neuro improves, Dr. Scot Dock following.   History of stroke 05/2015 admitted for confusion and right hemianopia, CT no acute abnormality.  Status post tPA, then developed GI bleeding due to recent polypectomy and tPA was reversed.  MRI showed left PCA infarct.  CTA head and neck left P2/P3 occlusion.  EF 20 to 25% but later improved to 35 to 40%.  LDL 186, A1c 6.0.  Discharged on DAPT and Crestor.  Residual right hemianopia.  However, wife stated that patient was driving prior to hospitalization.   Hypertension Home meds:  Losartan 25 mg BP fluctuate Long-term BP goal normotensive  Hyperlipidemia Home meds:  Crestor 40 mg, resumed in hospital LDL 69, goal < 70 Continue statin at discharge  Tobacco abuse Current  smoker Smoking cessation counseling will be provided  Other Stroke Risk Factors Advanced Age >/= 68  Coronary artery disease, STEMI status post stenting  Other Active Problems AKI, creatinine 1.20-1.63-1.36, likely due to Lasix use, encourage p.o. intake, BMP monitoring  Hospital day # 2  Christene Slates, MD PGY-1   ATTENDING NOTE: I reviewed above note and agree with the assessment and plan. Pt was seen and examined.   Wife and son at bedside.  Patient lying in bed, eyes open today, still has global aphasia, however able to have words out mostly word salad, however intermittent meaningful simple sentence "I do not know" "what do you say".  However, still not follow commands.  Not cooperative with carotid Doppler testing today.  Dr. Scot Dock recommend TCAR next Tuesday if neuro improves.  Continue DAPT and statin.  Will follow  For detailed assessment and plan, please refer to above/below as I have made changes  wherever appropriate.   [name], MD PhD Stroke Neurology 04/24/2022 6:44 PM    To contact Stroke Continuity provider, please refer to [email or web address] After hours, contact General Neurology

## 2022-04-25 ENCOUNTER — Inpatient Hospital Stay (HOSPITAL_COMMUNITY): Payer: Medicare Other

## 2022-04-25 DIAGNOSIS — I6522 Occlusion and stenosis of left carotid artery: Secondary | ICD-10-CM

## 2022-04-25 DIAGNOSIS — E876 Hypokalemia: Secondary | ICD-10-CM

## 2022-04-25 DIAGNOSIS — I5040 Unspecified combined systolic (congestive) and diastolic (congestive) heart failure: Secondary | ICD-10-CM | POA: Diagnosis not present

## 2022-04-25 DIAGNOSIS — I2510 Atherosclerotic heart disease of native coronary artery without angina pectoris: Secondary | ICD-10-CM

## 2022-04-25 DIAGNOSIS — E1169 Type 2 diabetes mellitus with other specified complication: Secondary | ICD-10-CM

## 2022-04-25 DIAGNOSIS — I639 Cerebral infarction, unspecified: Secondary | ICD-10-CM | POA: Diagnosis not present

## 2022-04-25 DIAGNOSIS — I10 Essential (primary) hypertension: Secondary | ICD-10-CM

## 2022-04-25 DIAGNOSIS — Z9861 Coronary angioplasty status: Secondary | ICD-10-CM

## 2022-04-25 LAB — CBC
HCT: 40.5 % (ref 39.0–52.0)
Hemoglobin: 13.7 g/dL (ref 13.0–17.0)
MCH: 30.5 pg (ref 26.0–34.0)
MCHC: 33.8 g/dL (ref 30.0–36.0)
MCV: 90.2 fL (ref 80.0–100.0)
Platelets: 216 10*3/uL (ref 150–400)
RBC: 4.49 MIL/uL (ref 4.22–5.81)
RDW: 12.3 % (ref 11.5–15.5)
WBC: 8.9 10*3/uL (ref 4.0–10.5)
nRBC: 0 % (ref 0.0–0.2)

## 2022-04-25 LAB — MAGNESIUM: Magnesium: 2.2 mg/dL (ref 1.7–2.4)

## 2022-04-25 LAB — GLUCOSE, CAPILLARY
Glucose-Capillary: 99 mg/dL (ref 70–99)
Glucose-Capillary: 150 mg/dL — ABNORMAL HIGH (ref 70–99)
Glucose-Capillary: 164 mg/dL — ABNORMAL HIGH (ref 70–99)
Glucose-Capillary: 111 mg/dL — ABNORMAL HIGH (ref 70–99)

## 2022-04-25 LAB — BASIC METABOLIC PANEL
Anion gap: 11 (ref 5–15)
BUN: 24 mg/dL — ABNORMAL HIGH (ref 8–23)
CO2: 27 mmol/L (ref 22–32)
Calcium: 9.1 mg/dL (ref 8.9–10.3)
Chloride: 102 mmol/L (ref 98–111)
Creatinine, Ser: 1.24 mg/dL (ref 0.61–1.24)
GFR, Estimated: 60 mL/min — ABNORMAL LOW (ref >60)
Glucose, Bld: 96 mg/dL (ref 70–99)
Potassium: 3.3 mmol/L — ABNORMAL LOW (ref 3.5–5.1)
Sodium: 140 mmol/L (ref 135–145)

## 2022-04-25 MED ORDER — POTASSIUM CHLORIDE 20 MEQ PO PACK
40.0000 meq | PACK | Freq: Two times a day (BID) | ORAL | Status: AC
  Administered 2022-04-25 (×2): 40 meq via ORAL
  Filled 2022-04-25 (×2): qty 2

## 2022-04-25 MED ORDER — SERTRALINE HCL 50 MG PO TABS
25.0000 mg | ORAL_TABLET | Freq: Every day | ORAL | Status: DC
  Administered 2022-04-26: 25 mg via ORAL
  Filled 2022-04-25: qty 1

## 2022-04-25 MED ORDER — SERTRALINE HCL 25 MG PO TABS
12.5000 mg | ORAL_TABLET | Freq: Every day | ORAL | Status: AC
  Administered 2022-04-25: 12.5 mg via ORAL
  Filled 2022-04-25: qty 0.5

## 2022-04-25 NOTE — Care Management Important Message (Signed)
Important Message  Patient Details  Name: Damon Walker MRN: [id number] Date of Birth: Mar 29, 1946   Medicare Important Message Given:  Yes     Hannah Beat 04/25/2022, 12:42 PM

## 2022-04-25 NOTE — Progress Notes (Addendum)
Speech Language Pathology Treatment: Cognitive-Linquistic  Patient Details Name: Damon Walker MRN: [id number] DOB: [birth year] Today's Date: 04/25/2022 Time: 1205-1220 SLP Time Calculation (min) (ACUTE ONLY): 15 min  Assessment / Plan / Recommendation Clinical Impression  Pt again side lying, eyes closed. Wife reports intake has been poor, but he will eat foods he likes such as a muffin. He is also taking medications whole with water upon request. He opened eyes intermittently and would look at notebook on tray and said "what's all this?" Flipped through some pictures, but did not verbalize in response to SLP narrating a bit or asking a question to follow pts attention. Pt would not look at a video of [place] [employer] playing soccer, just scoffed and gestured it away. Wife reports pt has no interests (no pets, no close connection to grandchildren, nothing he likes to do). Pts attention could not be directed to task. Pt reportedly has read the menu and pointed to coffee and said 'coffee.' Wife believes he is depressed but attention and communicative intent appears specifically altered. Psychiatry arrived to assess pt.   HPI HPI: Damon Walker is an 76 y.o. M who presented to [facility] on 9/17 as a code stroke and was found to have an acute infarct in the left parietal lobe without hemorrhage.     Presented with sudden onset global aphasia, expressive more than receptive.  Neurology was consulted.  Patient was outside the window for TNK.      Patient was admitted by [facility].  The evening of 9/18 patient was found in another hospital room in the bathroom. Returned to bed and found to be hypoxic and diaphoretic. Increasing pulmonary edema, underwent diaphoresis with improvement. Initially passed yale, but SLP ordered for swallow next day.      SLP Plan         Recommendations for follow up therapy are one component of a multi-disciplinary discharge planning process, led by the attending physician.   Recommendations may be updated based on patient status, additional functional criteria and insurance authorization.    Recommendations                               Shahira Fiske, Katherene Ponto  04/25/2022, 12:42 PM

## 2022-04-25 NOTE — Progress Notes (Addendum)
Carotid artery duplex completed. Refer to "CV Proc" under chart review to view preliminary results.  04/25/2022 11:33 AM Kelby Aline., MHA, RVT, RDCS, RDMS

## 2022-04-25 NOTE — Progress Notes (Signed)
   VASCULAR SURGERY ASSESSMENT & PLAN:   ACUTE LEFT PARIETAL STROKE: This patient has a moderate left carotid stenosis by duplex which extends very high.  CT scan suggested moderate 50% stenosis.  I have asked the vascular lab to try again to get a carotid duplex as I think this information would be helpful.  If the patient shows significant clinical improvement, and if the duplex suggest a more significant stenosis, then I think he would be a candidate for a left TCAR.  The stenosis extends very high and the patient has multiple medical comorbidities so I do not think he is a good candidate for carotid endarterectomy.  He is on aspirin, Plavix, and a statin.  He is tentatively scheduled for a TCAR on Tuesday however this will depend upon his clinical course and the results of his carotid duplex scan.  SUBJECTIVE:   His speech has improved slightly.  He follows commands.  PHYSICAL EXAM:   Vitals:   04/24/22 2022 04/25/22 0004 04/25/22 0351 04/25/22 0800  BP: 114/60 (!) 141/60 (!) 130/53 127/62  Pulse: 74  (!) 57 66  Resp: '19 16 16 18  '$ Temp: 98.2 F (36.8 C) 97.8 F (36.6 C) 97.9 F (36.6 C) 97.8 F (36.6 C)  TempSrc: Axillary Axillary Oral Oral  SpO2: 95% 96% 98% 92%  Weight:      Height:       No focal weakness detected.  LABS:   Lab Results  Component Value Date   WBC 8.9 04/25/2022   HGB 13.7 04/25/2022   HCT 40.5 04/25/2022   MCV 90.2 04/25/2022   PLT 216 04/25/2022   Lab Results  Component Value Date   CREATININE 1.24 04/25/2022   Lab Results  Component Value Date   INR 1.1 04/21/2022   CBG (last 3)  Recent Labs    04/24/22 1605 04/24/22 2146 04/25/22 0643  GLUCAP 113* 104* 99    PROBLEM LIST:    Principal Problem:   Acute CVA (cerebrovascular accident) ([place]) Active Problems:   Essential hypertension   Hyperlipidemia with target LDL less than 70   Carotid arterial disease (HCC)   CAD-native artery   CKD (chronic kidney disease), stage III (HCC)    Type 2 diabetes mellitus with other specified complication (HCC)   Combined systolic and diastolic heart failure (HCC)   Acute respiratory failure with hypoxia and hypercapnia (HCC)   CURRENT MEDS:    aspirin  300 mg Rectal Daily   Or   aspirin EC  81 mg Oral Daily   Chlorhexidine Gluconate Cloth  6 each Topical Daily   clopidogrel  75 mg Oral Daily   enoxaparin (LOVENOX) injection  40 mg Subcutaneous Q24H   famotidine  40 mg Oral QODAY   ferrous sulfate  325 mg Oral Q breakfast   insulin aspart  0-9 Units Subcutaneous TID WC   mouth rinse  15 mL Mouth Rinse 4 times per day   pantoprazole  20 mg Oral QODAY   potassium chloride  40 mEq Oral BID   QUEtiapine  25 mg Oral QHS   rosuvastatin  40 mg Oral q1800    Deitra Mayo Office: [phone] 04/25/2022

## 2022-04-25 NOTE — Progress Notes (Signed)
Inpatient Rehabilitation Admissions Coordinator   Noted plans for tentative plan for TCAR on Tuesday. I will place rehab consult for assessment of potential CIR admit if needed postop.  Danne Baxter, RN, MSN Rehab Admissions Coordinator [phone] 04/25/2022 11:33 AM

## 2022-04-25 NOTE — Plan of Care (Signed)
Pt. Unable to do in depth stroke teaching but the importance of medication compliance has showed improvement.  Family at bedside also helping with medication compliance and reinforcing its necessity   Problem: Education: Goal: Knowledge of secondary prevention will improve (SELECT ALL) Outcome: Progressing Goal: Knowledge of patient specific risk factors will improve (INDIVIDUALIZE FOR PATIENT) Outcome: Progressing

## 2022-04-25 NOTE — Progress Notes (Signed)
Mobility Specialist: Progress Note   04/25/22 1526  Mobility  Activity Ambulated with assistance in hallway  Level of Assistance Contact guard assist, steadying assist  Assistive Device None  Distance Ambulated (ft) 250 ft  Activity Response Tolerated well  $Mobility charge 1 Mobility   Received pt in bed having no complaints and agreeable to mobility. Contact guard for balance. Pt was asymptomatic throughout ambulation and returned to room w/o fault. Left in bed w/ call bell in reach and all needs met.    Mobility Specialist Mobility Specialist [address]: [phone]  

## 2022-04-25 NOTE — Progress Notes (Addendum)
PROGRESS NOTE    Damon Walker  MWU:[id number] DOB: 11/08/1945 DOA: 04/21/2022 PCP: [name], MD    Brief Narrative:  Damon Walker is a 76 y.o. male with past medical history significant of carotid artery disease, hx of CVA, combined systolic and diastolic CHF, history of carotid artery stenosis hypertension, hyperlipidemia, type 2 diabetes, CKD stage III,presented to the hospital with difficulty speech, some confusion and not acting himself.    Code stroke was activated.  In the ED, blood pressure was slightly elevated.  Patient had global aphasia.  He was out of the window for TNK.  Creatinine was 1.3.  CT head scan did not show any acute findings.  CTA head and neck showed perfusion abnormality in the right temporal and left parietal lobe.  No large vessel occlusion was noted but carotid stenosis noted.  MRI of the brain showed left parietal lobe infarct without hemorrhage.  Patient was then admitted hospital for further evaluation and treatment.  On the night of admission patient was confused and was noted to be hypoxic and diaphoretic so was transferred to the ICU.  ABG showed a pH of 7.1.  Patient was put on nonrebreather mask initially.  He was put on BiPAP subsequently.  BiPAP has been weaned off and patient has been transferred back to medical service at this time.  At this time, patient continues to have some confusion in carotid duplex ultrasound is pending due to uncooperativeness.  Vascular surgery is involved for possible TCAR.     Assessment and Plan: Principal Problem:   Acute CVA (cerebrovascular accident) ([place]) Active Problems:   CAD-native artery   Essential hypertension   Hyperlipidemia with target LDL less than 70   Carotid arterial disease ([place])   CKD (chronic kidney disease), stage III ([place])   Type 2 diabetes mellitus with other specified complication (HCC)   Combined systolic and diastolic heart failure (HCC)   Acute respiratory failure with hypoxia and  hypercapnia (HCC)   Hypokalemia   * Acute CVA (cerebrovascular accident) ([place]) MRI brain showed left parietal lobe infarct without hemorrhage.  Neurology the patient during hospitalization.  CTA neck showed significant carotid stenosis and significant plaque in the left internal carotid artery so carotid duplex was attempted twice but was unable to be completed due to patient's uncooperativeness.  I have requested to see if it can be done today in the presence of the family.  Patient is currently on aspirin Plavix and Crestor 40 mg daily.  History of CVA in the past.  Speech therapy has recommended regular consistency diet.  Confusion and disorientation possible delirium.   on Seroquel.  Off restraints.  He appears to be more alert awake when the family is around.    Carotid arterial disease ([place]) Patient had carotid ultrasound on 10/2021 showed: right ICA 1-39% stenosis, left ICA: 40-59% stenosis.  CTA at this time showed extensive atherosclerotic disease in the carotid bilaterally and 50% stenosis in the proximal left internal carotid artery.  Vascular surgery on board and wanting to do a carotid duplex but unable to perform due to uncooperativeness x2.  Will retry again.  Continue aspirin and Plavix.  As for vascular surgery patient is not a candidate for surgery but potential for TCAR.  History of coronary artery disease-STEMI post drug-eluting stent in the past. Continue statins.  On Coreg at home currently on hold.  Combined systolic and diastolic heart failure (HCC) Currently euvolemic.  On Lasix at home at 20 mg daily as needed.  Review of 2D echocardiogram from 04/22/2022 shows LV ejection fraction of 30 to 35% with global hypokinesis and grade 1 diastolic dysfunction and akinesis of the left ventricular apical segment of anterior wall.  Type 2 diabetes mellitus with other specified complication (HCC) Controlled.  Hemoglobin A1c from 04/22/2022 at 5.8.  CKD (chronic kidney disease), stage  III (HCC) Creatinine likely at baseline. Creatinine today at 1.2.  Hyperlipidemia with target LDL less than 70 Continue crestor.  Lipid panel from 04/22/2022 shows LDL of 69.  Essential hypertension Patient was on Coreg at home.  Patient was  on permissive hypertension for the first 48 hours.  Latest blood pressure of 129/64.  Resume beta-blocker by tomorrow.  Mild hypokalemia.  Will replace.  Check levels in AM.    DVT prophylaxis: enoxaparin (LOVENOX) injection 40 mg Start: 04/23/22 1500 SCDs Start: 04/23/22 1319 SCD's Start: 04/21/22 1606   Code Status:     Code Status: Full Code  Disposition:  CIR as per PT evaluation after surgical intervention.  Status is: Inpatient  Remains inpatient appropriate because: Significant carotid stenosis with stroke, possible need for vascular intervention,   Family Communication: Spoke with the patient's wife at bedside.  Consultants:  Neurology Vascular surgery  Procedures:  None  Antimicrobials:  None  Anti-infectives (From admission, onward)    None       Subjective: Today, patient was seen and examined at bedside.  Tried to verbalize with family at bedside.  Followed commands like  squeezing hands but intermittently.  Off chest vest restraints.  Objective: Vitals:   04/25/22 0004 04/25/22 0351 04/25/22 0800 04/25/22 1129  BP: (!) 141/60 (!) 130/53 127/62 129/64  Pulse:  (!) 57 66 68  Resp: '16 16 18 18  '$ Temp: 97.8 F (36.6 C) 97.9 F (36.6 C) 97.8 F (36.6 C) 98.1 F (36.7 C)  TempSrc: Axillary Oral Oral Axillary  SpO2: 96% 98% 92% 94%  Weight:      Height:        Intake/Output Summary (Last 24 hours) at 04/25/2022 1258 Last data filed at 04/25/2022 0900 Gross per 24 hour  Intake 240 ml  Output 400 ml  Net -160 ml   Filed Weights   04/22/22 0100  Weight: 87.5 kg    Physical Examination: Body mass index is 29.33 kg/m.   General:  Average built, not in obvious distress, mildly verbal, hard of  hearing, HENT:   No scleral pallor or icterus noted. Oral mucosa is moist.  Chest:  Clear breath sounds.  Diminished breath sounds bilaterally. No crackles or wheezes.  CVS: S1 &S2 heard. No murmur.  Regular rate and rhythm. Abdomen: Soft, nontender, nondistended.  Bowel sounds are heard.   Extremities: No cyanosis, clubbing or edema.  Peripheral pulses are palpable. Psych: Alert, awake mild complains unlikely squeezing hands.  Intermittent complaints of increased CNS: Aphasia, moves extremities, hard of hearing Skin: Warm and dry.  No rashes noted.  Data Reviewed:   CBC: Recent Labs  Lab 04/21/22 1317 04/21/22 1326 04/22/22 0247 04/22/22 1520 04/23/22 0201 04/24/22 0244 04/25/22 0336  WBC 8.8  --   --   --  9.2 10.1 8.9  NEUTROABS 5.3  --   --   --   --   --   --   HGB 13.6   < > 13.3 13.3 12.9* 14.3 13.7  HCT 41.5   < > 39.0 39.0 39.4 43.0 40.5  MCV 93.3  --   --   --  92.1 90.5  90.2  PLT 215  --   --   --  190 223 216   < > = values in this interval not displayed.    Basic Metabolic Panel: Recent Labs  Lab 04/21/22 1317 04/21/22 1326 04/22/22 0151 04/22/22 0247 04/22/22 1520 04/23/22 0201 04/24/22 0244 04/25/22 0336  NA 140 142 142 140 142 141 141 140  K 4.1 4.1 4.2 4.0 3.3* 3.6 3.5 3.3*  CL 107 107 104  --   --  107 102 102  CO2 25  --  24  --   --  '25 26 27  '$ GLUCOSE 99 97 211*  --   --  103* 101* 96  BUN '16 15 17  '$ --   --  26* 22 24*  CREATININE 1.30* 1.20 1.63*  --   --  1.36* 1.28* 1.24  CALCIUM 9.3  --  9.1  --   --  9.0 9.1 9.1  MG  --   --   --   --   --   --   --  2.2    Liver Function Tests: Recent Labs  Lab 04/21/22 1317  AST 24  ALT 20  ALKPHOS 42  BILITOT 0.6  PROT 7.4  ALBUMIN 3.6     Radiology Studies: VAS US CAROTID  Result Date: 04/25/2022 Carotid Arterial Duplex Study Patient Name:  Damon Walker  Date of Exam:   04/25/2022 Medical Rec #: [id number]         Accession #:    [id number] Date of Birth: 1945-11-26          Patient  Gender: M Patient Age:   78 years Exam Location:  [facility] Procedure:      VAS US CAROTID Referring Phys: Harrell Gave DICKSON --------------------------------------------------------------------------------  Indications:       Carotid artery disease. Risk Factors:      Hypertension, hyperlipidemia, Diabetes, past history of                    smoking, coronary artery disease. Limitations        Today's exam was limited due to the patient's inability or                    unwillingness to cooperate. Comparison Study:  10/23/2021 carotid artery duplex- right 1-39% ICA stenosis,                    left 40-59% ICA stenosis. Performing Technologist: Maudry Mayhew MHA, RDMS, RVT, RDCS Supporting Technologist: Rogelia Rohrer RVT, RDMS  Examination Guidelines: A complete evaluation includes B-mode imaging, spectral Doppler, color Doppler, and power Doppler as needed of all accessible portions of each vessel. Bilateral testing is considered an integral part of a complete examination. Limited examinations for reoccurring indications may be performed as noted.  Right Carotid Findings: +----------+--------+-------+--------+----------------------+------------------+           PSV cm/sEDV    StenosisPlaque Description    Comments                             cm/s                                                    +----------+--------+-------+--------+----------------------+------------------+ CCA Prox  66  10                                   intimal thickening +----------+--------+-------+--------+----------------------+------------------+ CCA Distal88      19             smooth and                                                                heterogenous                             +----------+--------+-------+--------+----------------------+------------------+ ICA Prox  123     29             smooth and                                                                 heterogenous                             +----------+--------+-------+--------+----------------------+------------------+ ICA Distal80      23                                                      +----------+--------+-------+--------+----------------------+------------------+ +----------+--------+-------+------------+-------------------+           PSV cm/sEDV cmsDescribe    Arm Pressure (mmHG) +----------+--------+-------+------------+-------------------+ Subclavian               Not assessed                    +----------+--------+-------+------------+-------------------+ +---------+--------+--------+------------+ VertebralPSV cm/sEDV cm/sNot assessed +---------+--------+--------+------------+  Left Carotid Findings: +----------+--------+--------+--------+--------------------------+--------+           PSV cm/sEDV cm/sStenosisPlaque Description        Comments +----------+--------+--------+--------+--------------------------+--------+ CCA Prox  89      16                                                 +----------+--------+--------+--------+--------------------------+--------+ CCA Distal76      12                                                 +----------+--------+--------+--------+--------------------------+--------+ ICA Prox  209     55      40-59%  heterogenous and irregular         +----------+--------+--------+--------+--------------------------+--------+ ICA Mid   207     40              heterogenous and irregular         +----------+--------+--------+--------+--------------------------+--------+  ICA Distal161     31                                                 +----------+--------+--------+--------+--------------------------+--------+ ECA       126     13                                                 +----------+--------+--------+--------+--------------------------+--------+  +----------+--------+--------+------------+-------------------+           PSV cm/sEDV cm/sDescribe    Arm Pressure (mmHG) +----------+--------+--------+------------+-------------------+ Subclavian                Not assessed                    +----------+--------+--------+------------+-------------------+ +---------+--------+--+--------+--+---------+ VertebralPSV cm/s76EDV cm/s13Antegrade +---------+--------+--+--------+--+---------+     Preliminary       LOS: 3 days    Flora Lipps, MD [facility] Available via Epic secure chat 7am-7pm After these hours, please refer to coverage provider listed on [email or web address] 04/25/2022, 12:58 PM

## 2022-04-25 NOTE — Progress Notes (Signed)
Occupational Therapy Treatment Patient Details Name: Damon Walker MRN: [id number] DOB: March 29, 1946 Today's Date: 04/25/2022   History of present illness The pt is a 76 yo male presenting 9/17 with AMS. 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 During night of 9/17 pt found in another room's bathroom, hypoxic, and diaphoretic. Placed on Bipap due to continued respiratory distress. PMH includes: CAD, CHF, HLD, CVA of L PCA in 2016, STEMI with DES x2, and DM II.   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 He remains appropriate for CIR.    Recommendations for follow up therapy are one component of a multi-disciplinary discharge planning process, led by the attending physician.  Recommendations may be updated based on patient status, additional functional criteria and insurance authorization.    Follow Up Recommendations  Acute inpatient rehab (3hours/day)    Assistance Recommended at Discharge Frequent or constant Supervision/Assistance  Patient can return home with the following  A little help with walking and/or transfers;A little help with bathing/dressing/bathroom;Direct supervision/assist for medications management;Direct supervision/assist for financial management   Equipment Recommendations  None recommended by OT    Recommendations for Other Services Rehab consult    Precautions / Restrictions Precautions Precautions: Fall Precaution Comments: HOH Restrictions Weight Bearing Restrictions: No       Mobility Bed Mobility Overal bed mobility: Needs Assistance Bed Mobility: Sidelying to Sit, Sit to Supine   Sidelying to sit: Min assist Supine to sit: Min assist Sit to supine: Min assist   General bed mobility comments: Pt impulsively initiating and requiring min A d/t apraxia    Transfers Overall  transfer level: Needs assistance Equipment used: 1 person hand held assist Transfers: Sit to/from Stand, Bed to chair/wheelchair/BSC Sit to Stand: Min assist Stand pivot transfers: Min assist               Balance Overall balance assessment: Needs assistance Sitting-balance support: No upper extremity supported, Feet supported Sitting balance-Leahy Scale: Good     Standing balance support: Single extremity supported, No upper extremity supported, During functional activity Standing balance-Leahy Scale: Fair                             ADL either performed or assessed with clinical judgement   ADL Overall ADL's : Needs assistance/impaired     Grooming: Wash/dry hands;Wash/dry face;Supervision/safety   Upper Body Bathing: Supervision/ safety   Lower Body Bathing: Minimal assistance;Cueing for safety;Sit to/from stand   Upper Body Dressing : Moderate assistance Upper Body Dressing Details (indicate cue type and reason): Poor motor planning UB dressing                 Functional mobility during ADLs: Minimal assistance General ADL Comments: Min A overall with poor attention impacting safety    Extremity/Trunk Assessment Upper Extremity Assessment Upper Extremity Assessment: Generalized weakness RUE Deficits / Details: Able to perform toileting and LB ADLs in standing without cueing to integrate BUE RUE Coordination: decreased fine motor;decreased gross motor   Lower Extremity Assessment Lower Extremity Assessment: Defer to PT evaluation        Vision   Vision Assessment?: Vision impaired- to be further tested in functional context Additional Comments: Accurate reaching to objects this session, kept eyes open and more engaged   Perception Perception Perception:  Impaired   Praxis Praxis Praxis: Impaired Praxis Impairment Details: Ideation;Motor planning;Initiation;Ideomotor    Cognition Arousal/Alertness: Awake/alert Behavior During Therapy:  Flat affect Overall Cognitive Status: Impaired/Different from baseline Area of Impairment: Orientation, Attention, Memory, Following commands, Safety/judgement, Awareness, Problem solving                 Orientation Level: Disoriented to, Place, Time, Situation Current Attention Level: Sustained Memory: Decreased short-term memory Following Commands: Follows one step commands consistently, Follows multi-step commands consistently Safety/Judgement: Decreased awareness of safety Awareness: Intellectual Problem Solving: Slow processing, Decreased initiation, Difficulty sequencing, Requires verbal cues, Requires tactile cues General Comments: Global aphasia. Limited by poor sustained attention to task. More alert and engaged today              General Comments Pt much improved ths session from a mobility standpoint. 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    Pertinent Vitals/ Pain       Pain Assessment Pain Assessment: No/denies pain   Frequency  Min 2X/week        Progress Toward Goals  OT Goals(current goals can now be found in the care plan section)  Progress towards OT goals: Progressing toward goals  Acute Rehab OT Goals Patient Stated Goal: none stated OT Goal Formulation: With family Time For Goal Achievement: 05/06/22 Potential to Achieve Goals: Good  Plan Discharge plan remains appropriate    Co-evaluation                 AM-PAC OT "6 Clicks" Daily Activity     Outcome Measure   Help from another person eating meals?: A Little Help from another person taking care of personal grooming?: A Little Help from another person toileting, which includes using toliet, bedpan, or urinal?: A Little Help from another person bathing (including washing, rinsing, drying)?: A Little Help from another person to put on and taking off regular upper body clothing?: A Little Help from another person to put on and taking off regular lower  body clothing?: A Little 6 Click Score: 18    End of Session Equipment Utilized During Treatment: Gait belt  OT Visit Diagnosis: Unsteadiness on feet (R26.81);Other abnormalities of gait and mobility (R26.89);Muscle weakness (generalized) (M62.81)   Activity Tolerance Patient tolerated treatment well   Patient Left Other (comment) (pass off to PT)   Nurse Communication Mobility status        Time: 1042-1100 OT Time Calculation (min): 18 min  Charges: OT General Charges $OT Visit: 1 Visit OT Treatments $Self Care/Home Management : 8-22 mins  Laverle Hobby, OTR/L, [place] Acute Rehab Office: [place] 04/25/2022, 11:14 AM

## 2022-04-25 NOTE — Progress Notes (Signed)
  Progress Note    04/25/2022 7:40 AM  Subjective:  nonverbal but will nod and shake his head when questioned   Vitals:   04/25/22 0004 04/25/22 0351  BP: (!) 141/60 (!) 130/53  Pulse:  (!) 57  Resp: 16 16  Temp: 97.8 F (36.6 C) 97.9 F (36.6 C)  SpO2: 96% 98%   Physical Exam: Lungs:  non labored Extremities:  moving all extremities Abdomen:  soft, NT, ND Neurologic: non verbal, participating some during exam  CBC    Component Value Date/Time   WBC 8.9 04/25/2022 0336   RBC 4.49 04/25/2022 0336   HGB 13.7 04/25/2022 0336   HCT 40.5 04/25/2022 0336   PLT 216 04/25/2022 0336   MCV 90.2 04/25/2022 0336   MCH 30.5 04/25/2022 0336   MCHC 33.8 04/25/2022 0336   RDW 12.3 04/25/2022 0336   LYMPHSABS 1.9 04/21/2022 1317   MONOABS 0.9 04/21/2022 1317   EOSABS 0.5 04/21/2022 1317   BASOSABS 0.1 04/21/2022 1317    BMET    Component Value Date/Time   NA 140 04/25/2022 0336   NA 146 (H) 11/24/2019 1830   K 3.3 (L) 04/25/2022 0336   CL 102 04/25/2022 0336   CO2 27 04/25/2022 0336   GLUCOSE 96 04/25/2022 0336   BUN 24 (H) 04/25/2022 0336   BUN 24 11/24/2019 1830   CREATININE 1.24 04/25/2022 0336   CREATININE 1.51 (H) 09/24/2016 1554   CALCIUM 9.1 04/25/2022 0336   GFRNONAA 60 (L) 04/25/2022 0336   GFRAA 69 11/24/2019 1830    INR    Component Value Date/Time   INR 1.1 04/21/2022 1317     Intake/Output Summary (Last 24 hours) at 04/25/2022 0740 Last data filed at 04/25/2022 0418 Gross per 24 hour  Intake --  Output 750 ml  Net -750 ml     Assessment/Plan:  76 y.o. male with symptomatic left carotid stenosis  Aphasic after left parietal infarct.  50% L ICA stenosis by CTA.  Clinically not much improvement over past 24 hours.  Would consider TCAR next week if he improves clinically.  Continue aspirin, statin, and plavix for now   Dagoberto Ligas, PA-C [facility] [phone] 04/25/2022 7:40 AM

## 2022-04-25 NOTE — Progress Notes (Signed)
Physical Therapy Treatment Patient Details Name: Damon Walker MRN: [id number] DOB: 1946-05-18 Today's Date: 04/25/2022   History of Present Illness The pt is a 76 yo male presenting 9/17 with AMS. 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 During night of 9/17 pt found in another room's bathroom, hypoxic, and diaphoretic. Placed on Bipap due to continued respiratory distress. PMH includes: CAD, CHF, HLD, CVA of L PCA in 2016, STEMI with DES x2, and DM II.    PT Comments    Pt with significant functional improvement this session. Continues to be very limited by cognition, with global aphasia and apraxia adding increased complexity to deficits. Intensive, multidisciplinary rehab continues to be the most appropriate d/c disposition at this time. Will continue to follow.    Recommendations for follow up therapy are one component of a multi-disciplinary discharge planning process, led by the attending physician.  Recommendations may be updated based on patient status, additional functional criteria and insurance authorization.  Follow Up Recommendations  Acute inpatient rehab (3hours/day)     Assistance Recommended at Discharge Frequent or constant Supervision/Assistance  Patient can return home with the following Assistance with cooking/housework;Direct supervision/assist for medications management;Direct supervision/assist for financial management;Assist for transportation;Help with stairs or ramp for entrance;A little help with walking and/or transfers;A little help with bathing/dressing/bathroom;Assistance with feeding   Equipment Recommendations  Other (comment) (defer to next venue)    Recommendations for Other Services Rehab consult     Precautions / Restrictions Precautions Precautions: Fall Precaution Comments: HOH Restrictions Weight Bearing Restrictions: No     Mobility  Bed Mobility Overal bed mobility: Needs Assistance Bed Mobility:  Sidelying to Sit, Sit to Supine     Supine to sit: Min assist Sit to supine: Min guard   General bed mobility comments: Pt impulsively initiating and requiring min A d/t apraxia. No assist to return to supine at end of session.    Transfers Overall transfer level: Needs assistance Equipment used: 1 person hand held assist Transfers: Sit to/from Stand, Bed to chair/wheelchair/BSC Sit to Stand: Min assist           General transfer comment: Light assist for balance recovery. Pt with good power up to full stand.    Ambulation/Gait Ambulation/Gait assistance: Min assist Gait Distance (Feet): 200 Feet Assistive device: None Gait Pattern/deviations: Step-through pattern, Decreased stride length, Antalgic, Wide base of support Gait velocity: Decreased Gait velocity interpretation: <1.8 ft/sec, indicate of risk for recurrent falls   General Gait Details: Pt required varying degrees of min assist throughout gait training. Poor attention to hallway obstacles as well as to L side. Gaxe fixed to R.   Stairs             Wheelchair Mobility    Modified Rankin (Stroke Patients Only) Modified Rankin (Stroke Patients Only) Pre-Morbid Rankin Score: No symptoms Modified Rankin: Moderately severe disability     Balance Overall balance assessment: Needs assistance Sitting-balance support: No upper extremity supported, Feet supported Sitting balance-Leahy Scale: Good Sitting balance - Comments: Min guard for static sitting balance EOB Postural control: Posterior lean Standing balance support: Single extremity supported, No upper extremity supported, During functional activity Standing balance-Leahy Scale: Fair                              Cognition Arousal/Alertness: Awake/alert Behavior During Therapy: Flat affect Overall Cognitive Status: Impaired/Different from baseline Area of Impairment: Orientation, Attention, Memory, Following commands, Safety/judgement,  Awareness, Problem solving                 Orientation Level: Disoriented to, Place, Time, Situation Current Attention Level: Sustained Memory: Decreased short-term memory Following Commands: Follows one step commands consistently, Follows multi-step commands consistently Safety/Judgement: Decreased awareness of safety Awareness: Intellectual Problem Solving: Slow processing, Decreased initiation, Difficulty sequencing, Requires verbal cues, Requires tactile cues General Comments: Global aphasia. Limited by poor sustained attention to task. More alert and engaged today        Exercises      General Comments General comments (skin integrity, edema, etc.): Pt much improved ths session from a mobility standpoint. 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      Pertinent Vitals/Pain Pain Assessment Pain Assessment: No/denies pain    Home Living                          Prior Function            PT Goals (current goals can now be found in the care plan section) Acute Rehab PT Goals Patient Stated Goal: did not state PT Goal Formulation: Patient unable to participate in goal setting Time For Goal Achievement: 05/06/22 Potential to Achieve Goals: Good Progress towards PT goals: Progressing toward goals    Frequency    Min 4X/week      PT Plan Current plan remains appropriate    Co-evaluation              AM-PAC PT "6 Clicks" Mobility   Outcome Measure  Help needed turning from your back to your side while in a flat bed without using bedrails?: A Little Help needed moving from lying on your back to sitting on the side of a flat bed without using bedrails?: A Little Help needed moving to and from a bed to a chair (including a wheelchair)?: A Lot Help needed standing up from a chair using your arms (e.g., wheelchair or bedside chair)?: A Lot Help needed to walk in hospital room?: A Lot Help needed climbing 3-5 steps with  a railing? : A Lot 6 Click Score: 14    End of Session Equipment Utilized During Treatment: Gait belt;Oxygen Activity Tolerance: Patient tolerated treatment well Patient left: in bed;with call bell/phone within reach;with bed alarm set;with restraints reapplied Nurse Communication: Mobility status PT Visit Diagnosis: Other abnormalities of gait and mobility (R26.89);Muscle weakness (generalized) (M62.81);Unsteadiness on feet (R26.81);Difficulty in walking, not elsewhere classified (R26.2);Other symptoms and signs involving the nervous system (R29.898)     Time: 1100-1115 PT Time Calculation (min) (ACUTE ONLY): 15 min  Charges:  $Gait Training: 8-22 mins                     Damon Walker, PT, DPT Acute Rehabilitation Services Secure Chat Preferred Office: [phone]    Damon Walker 04/25/2022, 12:43 PM

## 2022-04-25 NOTE — Progress Notes (Addendum)
STROKE TEAM PROGRESS NOTE   INTERVAL HISTORY Patient evaluated at bedside this AM, wife and SLP are present. Per SLP, attention and communication are poor. We have similar findings during our encounter with him. Attempted to motivate patient to carry out verbal commands, when asked to state his name he says "that is easy" and closed his eyes.  Wife was amenable to starting him on SSRI to improve depression.   Vitals:   04/25/22 0351 04/25/22 0800 04/25/22 1129 04/25/22 1506  BP: (!) 130/53 127/62 129/64 (!) 115/51  Pulse: (!) 57 66 68 76  Resp: '16 18 18 18  '$ Temp: 97.9 F (36.6 C) 97.8 F (36.6 C) 98.1 F (36.7 C) 98.2 F (36.8 C)  TempSrc: Oral Oral Axillary Oral  SpO2: 98% 92% 94% 96%  Weight:      Height:       CBC:  Recent Labs  Lab 04/21/22 1317 04/21/22 1326 04/24/22 0244 04/25/22 0336  WBC 8.8   < > 10.1 8.9  NEUTROABS 5.3  --   --   --   HGB 13.6   < > 14.3 13.7  HCT 41.5   < > 43.0 40.5  MCV 93.3   < > 90.5 90.2  PLT 215   < > 223 216   < > = values in this interval not displayed.   Basic Metabolic Panel:  Recent Labs  Lab 04/24/22 0244 04/25/22 0336  NA 141 140  K 3.5 3.3*  CL 102 102  CO2 26 27  GLUCOSE 101* 96  BUN 22 24*  CREATININE 1.28* 1.24  CALCIUM 9.1 9.1  MG  --  2.2   Lipid Panel:  Recent Labs  Lab 04/22/22 0151  CHOL 124  TRIG 103  HDL 34*  CHOLHDL 3.6  VLDL 21  LDLCALC 69   HgbA1c:  Recent Labs  Lab 04/22/22 0151  HGBA1C 5.8*   Urine Drug Screen:  Recent Labs  Lab 04/21/22 1442  LABOPIA NONE DETECTED  COCAINSCRNUR NONE DETECTED  LABBENZ NONE DETECTED  AMPHETMU NONE DETECTED  THCU NONE DETECTED  LABBARB NONE DETECTED    Alcohol Level  Recent Labs  Lab 04/21/22 1317  ETH <10    PHYSICAL EXAM GENERAL: Awake, alert, in no acute distress LUNGS: Normal respiratory effort. Non-labored breathing on room air CV: Regular rate and rhythm on telemetry   NEURO:  Mental Status: Awake, unable to answer orientation  questions, intermittently followed verbal commands.  Speech/Language: receptive aphasia, is able to say 1-3 word coherent sentences w/ word salad intermittently.  Cranial Nerves:  II: PERRL. Right hemianopia? III, IV, VI: Eyes open today. Lid elevation symmetric and full.  V: Sensation is intact to light touch and symmetrical to face. Blinks to threat. Moves jaw back and forth.  VII: R facial droop VIII: Hearing intact to voice IX, X: Phonation intatc XII: Tongue protrudes midline without fasciculations.   Motor: 5/5 strength in all muscle groups.  Tone is normal. Bulk is normal.  Sensation: Intact to light touch bilaterally in all four extremities. No extinction to DSS present.  Coordination: UTA 2/2 inability to follow commands Gait: Deferred  ASSESSMENT/PLAN [name] is a 76 y.o. 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 Initial NIH 8. Patient had a rapid response on 9/18 and was emergently transferred to  ICU and placed on bipap with improvement of respiratory status.  Patient appears is awake today, mentation remains poor and uncooperative demeanor makes it difficult to assess if he is progressing. Patient was unable to repeat words or carry out simple verbal commands. Will plan for 30 day monitor. Continue to  encourage p.o. intake, aggressive speech therapy.  Stroke:  Acute ischemic L MCA infarct, etiology likely from left ICA stenosis/plaque  Code Stroke CT head No acute abnormality.  CTA head & neck 50% diameter stenosis proximal left internal carotid artery. Diminutive right vertebral artery which appears diffusely diseased and has minimal contribution to the basilar. MRI  Acute infarct left parietal lobe without hemorrhage 2D Echo LVEF 30-35%  CUS  left ICA 40-59% stenosis Will need 30-day CardioNet monitoring to rule out A-fib as outpatient LDL 69 HgbA1c 5.8 VTE prophylaxis - SCDs clopidogrel 75 mg daily prior to admission, now on Aspirin 81 mg and clopidogrel 75 mg Therapy recommendations: CIR candidate Disposition: pending  Combined systolic and diastolic heart failure Acute pulmonary edema Home meds: Coreg 12.5 mg, Losartan 25 mg, Lasix 20 mg 05/2015 EF 20 to 25% initially but improved to 35 to 40% later 11/2016 EF 30 to 35% Pt has declined ICD in the past with Dr. Ellyn Walker This admission EF 30 to 35% 9/17 developed respiratory distress, transfer to ICU, put on BiPAP, doing well, now off BiPAP  Carotid arterial disease ([place]) Followed by vascular surgery OP Last carotid US in 10/2021 left ICA: 40-59% stenosis  This admission CT head and neck 50% left ICA stenosis CUS this admission - left ICA 40-59% stenosis Continue plavix and statin  Left TCAR next week if neuro improves, Dr. Scot Walker following.   History of stroke 05/2015 admitted for confusion and right hemianopia, CT no acute abnormality.  Status post tPA, then developed GI bleeding due to recent polypectomy and tPA was reversed.  MRI showed left PCA infarct.  CTA head and neck left P2/P3 occlusion.  EF 20 to 25% but later improved to 35 to 40%.  LDL 186, A1c 6.0.  Discharged on DAPT and Crestor.  Residual right hemianopia.  However, wife stated that patient was driving prior to hospitalization.   Hypertension Home meds:  Losartan 25 mg BP fluctuate Long-term BP goal normotensive  Hyperlipidemia Home meds:  Crestor 40 mg, resumed in hospital LDL 69, goal < 70 Continue statin at discharge  Tobacco abuse Current smoker Smoking cessation counseling will be provided  Other Stroke Risk Factors Advanced Age >/= 81  Coronary artery disease, STEMI status post stenting  Other Active Problems AKI- resolved  Post Stroke Depression - started zoloft, 12.5 mg today and 25  mg tomorrow to minimize side effects  Hospital day # 3  Damon Slates, MD PGY-1   ATTENDING NOTE: I reviewed above note and agree with the assessment and plan. Pt was seen and examined.   Wife at the bedside.  Patient lying in bed, still has aphasia, however, more language output than before, able to say " what do you want" " that is easy", however still has difficulty following commands and frustrated and not quite cooperative with exam.  However moving all extremities.  Carotid Doppler showed left ICA 40 to 59% stenosis.  Vascular surgery Dr. Scot Walker on board, considering TCAR next Tuesday if clinically continue to improve.  For detailed assessment and plan, please refer to above/below as I have made changes wherever appropriate.   [name], MD PhD Stroke Neurology 04/25/2022 8:44 PM  To contact Stroke Continuity provider, please refer to [email or web address] After hours, contact General Neurology

## 2022-04-26 DIAGNOSIS — I5040 Unspecified combined systolic (congestive) and diastolic (congestive) heart failure: Secondary | ICD-10-CM | POA: Diagnosis not present

## 2022-04-26 DIAGNOSIS — I10 Essential (primary) hypertension: Secondary | ICD-10-CM | POA: Diagnosis not present

## 2022-04-26 DIAGNOSIS — I2510 Atherosclerotic heart disease of native coronary artery without angina pectoris: Secondary | ICD-10-CM | POA: Diagnosis not present

## 2022-04-26 DIAGNOSIS — I639 Cerebral infarction, unspecified: Secondary | ICD-10-CM | POA: Diagnosis not present

## 2022-04-26 DIAGNOSIS — I6522 Occlusion and stenosis of left carotid artery: Secondary | ICD-10-CM | POA: Diagnosis not present

## 2022-04-26 LAB — GLUCOSE, CAPILLARY
Glucose-Capillary: 110 mg/dL — ABNORMAL HIGH (ref 70–99)
Glucose-Capillary: 106 mg/dL — ABNORMAL HIGH (ref 70–99)
Glucose-Capillary: 120 mg/dL — ABNORMAL HIGH (ref 70–99)
Glucose-Capillary: 114 mg/dL — ABNORMAL HIGH (ref 70–99)

## 2022-04-26 LAB — BASIC METABOLIC PANEL
Anion gap: 7 (ref 5–15)
BUN: 23 mg/dL (ref 8–23)
CO2: 27 mmol/L (ref 22–32)
Calcium: 9.2 mg/dL (ref 8.9–10.3)
Chloride: 107 mmol/L (ref 98–111)
Creatinine, Ser: 1.28 mg/dL — ABNORMAL HIGH (ref 0.61–1.24)
GFR, Estimated: 57 mL/min — ABNORMAL LOW (ref >60)
Glucose, Bld: 97 mg/dL (ref 70–99)
Potassium: 4 mmol/L (ref 3.5–5.1)
Sodium: 141 mmol/L (ref 135–145)

## 2022-04-26 MED ORDER — SERTRALINE HCL 25 MG PO TABS
12.5000 mg | ORAL_TABLET | Freq: Every day | ORAL | Status: DC
  Administered 2022-04-27 – 2022-04-30 (×4): 12.5 mg via ORAL
  Filled 2022-04-26 (×2): qty 0.5
  Filled 2022-04-26: qty 1
  Filled 2022-04-26 (×2): qty 0.5

## 2022-04-26 MED ORDER — ONDANSETRON HCL 4 MG/2ML IJ SOLN
4.0000 mg | Freq: Four times a day (QID) | INTRAMUSCULAR | Status: DC | PRN
  Administered 2022-04-26: 4 mg via INTRAVENOUS
  Filled 2022-04-26: qty 2

## 2022-04-26 MED ORDER — CARVEDILOL 12.5 MG PO TABS
12.5000 mg | ORAL_TABLET | Freq: Two times a day (BID) | ORAL | Status: DC
  Administered 2022-04-26 – 2022-05-01 (×9): 12.5 mg via ORAL
  Filled 2022-04-26 (×9): qty 1

## 2022-04-26 NOTE — Progress Notes (Signed)
Mobility Specialist: Progress Note   04/26/22 1535  Mobility  Activity Refused mobility   Pt refused mobility, no reason specified. Will f/u as able.   [facility] [name] Mobility Specialist Mobility Specialist [address]: [phone]

## 2022-04-26 NOTE — TOC Progression Note (Signed)
Transition of Care [facility]) - Progression Note    Patient Details  Name: Damon Walker MRN: [id number] Date of Birth: [birth year]  [facility] CM/SW Contact  Pollie Friar, RN Phone Number: 04/26/2022, 10:37 AM  Clinical Narrative:    Plan is for TCAR on Tuesday of next week. CIR is following but unsure at his point if pt can tolerate.  TOC following.        Expected Discharge Plan and Services                                                 Social Determinants of Health (SDOH) Interventions    Readmission Risk Interventions     No data to display

## 2022-04-26 NOTE — Progress Notes (Signed)
OT Cancellation Note  Patient Details Name: Tavari Loadholt MRN: [id number] DOB: 11-Dec-1945   Cancelled Treatment:    Reason Eval/Treat Not Completed: Fatigue/lethargy limiting ability to participate- pt declines OT at this time, spouse reports lethargic from medication over night and pt being frustrated he can't go outside. Will follow and see as able.   Jolaine Artist, OT Acute Rehabilitation Services Office [phone]   Delight Stare 04/26/2022, 11:42 AM

## 2022-04-26 NOTE — Plan of Care (Signed)
  Problem: Education: Goal: Ability to describe self-care measures that may prevent or decrease complications (Diabetes Survival Skills Education) will improve Outcome: Progressing Goal: Individualized Educational Video(s) Outcome: Progressing   Problem: Coping: Goal: Ability to adjust to condition or change in health will improve Outcome: Progressing   Problem: Fluid Volume: Goal: Ability to maintain a balanced intake and output will improve Outcome: Progressing   Problem: Health Behavior/Discharge Planning: Goal: Ability to identify and utilize available resources and services will improve Outcome: Progressing Goal: Ability to manage health-related needs will improve Outcome: Progressing   Problem: Metabolic: Goal: Ability to maintain appropriate glucose levels will improve Outcome: Progressing   Problem: Nutritional: Goal: Maintenance of adequate nutrition will improve Outcome: Progressing Goal: Progress toward achieving an optimal weight will improve Outcome: Progressing   Problem: Skin Integrity: Goal: Risk for impaired skin integrity will decrease Outcome: Progressing   Problem: Tissue Perfusion: Goal: Adequacy of tissue perfusion will improve Outcome: Progressing   Problem: Education: Goal: Knowledge of General Education information will improve Description: Including pain rating scale, medication(s)/side effects and non-pharmacologic comfort measures Outcome: Progressing   Problem: Health Behavior/Discharge Planning: Goal: Ability to manage health-related needs will improve Outcome: Progressing   Problem: Clinical Measurements: Goal: Ability to maintain clinical measurements within normal limits will improve Outcome: Progressing Goal: Will remain free from infection Outcome: Progressing Goal: Diagnostic test results will improve Outcome: Progressing Goal: Respiratory complications will improve Outcome: Progressing Goal: Cardiovascular complication will  be avoided Outcome: Progressing   Problem: Activity: Goal: Risk for activity intolerance will decrease Outcome: Progressing   Problem: Nutrition: Goal: Adequate nutrition will be maintained Outcome: Progressing   Problem: Coping: Goal: Level of anxiety will decrease Outcome: Progressing   Problem: Elimination: Goal: Will not experience complications related to bowel motility Outcome: Progressing Goal: Will not experience complications related to urinary retention Outcome: Progressing   Problem: Pain Managment: Goal: General experience of comfort will improve Outcome: Progressing   Problem: Safety: Goal: Ability to remain free from injury will improve Outcome: Progressing   Problem: Skin Integrity: Goal: Risk for impaired skin integrity will decrease Outcome: Progressing   Problem: Safety: Goal: Non-violent Restraint(s) Outcome: Progressing   Problem: Education: Goal: Knowledge of secondary prevention will improve (SELECT ALL) Outcome: Progressing Goal: Knowledge of patient specific risk factors will improve (INDIVIDUALIZE FOR PATIENT) Outcome: Progressing

## 2022-04-26 NOTE — Plan of Care (Signed)
Pt. Not open to teaching today  Problem: Education: Goal: Knowledge of secondary prevention will improve (SELECT ALL) Outcome: Not Progressing Goal: Knowledge of patient specific risk factors will improve (INDIVIDUALIZE FOR PATIENT) Outcome: Not Progressing

## 2022-04-26 NOTE — Progress Notes (Signed)
Inpatient Rehabilitation Admissions Coordinator   Noted plans for TCAR Tuesday. I will follow up on Monday.  Danne Baxter, RN, MSN Rehab Admissions Coordinator [phone] 04/26/2022 12:11 PM

## 2022-04-26 NOTE — Progress Notes (Addendum)
  Progress Note  VASCULAR SURGERY ASSESSMENT & PLAN:   ACUTE LEFT PARIETAL STROKE: This patient has a moderate left carotid stenosis by duplex which extends very high.  CT scan suggested moderate 50% stenosis.  His carotid duplex scan yesterday showed evidence of a 40 to 59% proximal left ICA stenosis.  However based on the peak systolic velocity of 882 cm/s and an end-diastolic velocity of 55 cm/s this would suggest that the stenosis is at the higher end of that range.   He is on aspirin, Plavix, and a statin.  He is tentatively scheduled for a TCAR on Tuesday.  However, I think he needs to shows some significant improvement before Tuesday before we elected proceed with surgery.  If he does not show significant improvement it may be safest to wait.  [name], MD 1:40 PM   04/26/2022 7:47 AM * No surgery date entered *  Subjective:  somnolent this morning   Vitals:   04/26/22 0442 04/26/22 0726  BP: (!) 136/48 (!) 140/56  Pulse: 60 62  Resp: 16 17  Temp: 98.4 F (36.9 C) 97.9 F (36.6 C)  SpO2: 93% 97%   Physical Exam: Lungs:  non labored Neurologic: no focal weakness on exam    CBC    Component Value Date/Time   WBC 8.9 04/25/2022 0336   RBC 4.49 04/25/2022 0336   HGB 13.7 04/25/2022 0336   HCT 40.5 04/25/2022 0336   PLT 216 04/25/2022 0336   MCV 90.2 04/25/2022 0336   MCH 30.5 04/25/2022 0336   MCHC 33.8 04/25/2022 0336   RDW 12.3 04/25/2022 0336   LYMPHSABS 1.9 04/21/2022 1317   MONOABS 0.9 04/21/2022 1317   EOSABS 0.5 04/21/2022 1317   BASOSABS 0.1 04/21/2022 1317    BMET    Component Value Date/Time   NA 140 04/25/2022 0336   NA 146 (H) 11/24/2019 1830   K 3.3 (L) 04/25/2022 0336   CL 102 04/25/2022 0336   CO2 27 04/25/2022 0336   GLUCOSE 96 04/25/2022 0336   BUN 24 (H) 04/25/2022 0336   BUN 24 11/24/2019 1830   CREATININE 1.24 04/25/2022 0336   CREATININE 1.51 (H) 09/24/2016 1554   CALCIUM 9.1 04/25/2022 0336   GFRNONAA 60 (L) 04/25/2022  0336   GFRAA 69 11/24/2019 1830    INR    Component Value Date/Time   INR 1.1 04/21/2022 1317    Intake/Output Summary (Last 24 hours) at 04/26/2022 0747 Last data filed at 04/25/2022 0900 Gross per 24 hour  Intake 240 ml  Output 150 ml  Net 90 ml   Assessment/Plan:  76 y.o. male with symptomatic L ICA   Tentative plan for TCAR on Tuesday of next week pending clinical improvement.  Continue aspirin, plaivx, statin.  Encouraged participation with therapy teams.  Call Vascular on call with questions over the weekend  Dagoberto Ligas, [facility] [phone] 04/26/2022 7:47 AM  I have interviewed the patient and examined the patient. I agree with the findings by the PA.  [name], MD

## 2022-04-26 NOTE — Progress Notes (Signed)
PROGRESS NOTE    Damon Walker  XIH:[id number] DOB: 09/04/1945 DOA: 04/21/2022 PCP: Damon Pepper, MD    Brief Narrative:  Damon Walker is a 76 y.o. male with past medical history significant of carotid artery disease, hx of CVA, combined systolic and diastolic CHF, history of carotid artery stenosis hypertension, hyperlipidemia, type 2 diabetes, CKD stage III,presented to the hospital with difficulty speech, some confusion and not acting himself.    Code stroke was activated.  In the ED, blood pressure was slightly elevated.  Patient had global aphasia.  He was out of the window for TNK.  Creatinine was 1.3.  CT head scan did not show any acute findings.  CTA head and neck showed perfusion abnormality in the right temporal and left parietal lobe.  No large vessel occlusion was noted but carotid stenosis noted.  MRI of the brain showed left parietal lobe infarct without hemorrhage.  Patient was then admitted hospital for further evaluation and treatment.  On the night of admission patient was confused and was noted to be hypoxic and diaphoretic so was transferred to the ICU.  ABG showed a pH of 7.1.  Patient was put on nonrebreather mask initially.  He was put on BiPAP subsequently.  BiPAP has been weaned off and patient has been transferred back to medical service at this time.  At this time, patient continues to have some confusion but was able to do carotid duplex.  Vascular surgery planning for TCAR on Tuesday.     Assessment and Plan: Principal Problem:   Acute CVA (cerebrovascular accident) ([place]) Active Problems:   CAD-native artery   Essential hypertension   Hyperlipidemia with target LDL less than 70   Carotid arterial disease ([place])   CKD (chronic kidney disease), stage III ([place])   Type 2 diabetes mellitus with other specified complication (HCC)   Combined systolic and diastolic heart failure (HCC)   Acute respiratory failure with hypoxia and hypercapnia (HCC)   Hypokalemia    * Acute CVA (cerebrovascular accident) ([place]) MRI brain showed left parietal lobe infarct without hemorrhage.  Neurology followed the patient during hospitalization.  CTA neck showed significant carotid stenosis and significant plaque in the left internal carotid artery.  Carotid duplex was also performed.  Vascular surgery on board and plan for TCAR after the weekend.  Continue aspirin Plavix and Crestor 40 mg daily.  Speech therapy has recommended regular consistency diet.  Confusion and disorientation possible delirium.   on Seroquel.  Appears to be little sleepy this morning we will decrease the dose of Seroquel tonight to 12.5 from '25mg'$ .  Carotid arterial disease ([place]) Patient had carotid ultrasound on 10/2021 showed: right ICA 1-39% stenosis, left ICA: 40-59% stenosis.  CTA at this time showed extensive atherosclerotic disease in the carotid bilaterally and 50% stenosis in the proximal left internal carotid artery.    Continue aspirin and Plavix.  As for vascular surgery patient is not a candidate for surgery but potential for TCAR.  Restart Coreg.  History of coronary artery disease-STEMI post drug-eluting stent in the past. Continue statins.  On Coreg at home, currently on hold.  Discharge home today  Combined systolic and diastolic heart failure (HCC) Currently euvolemic.  On Lasix at home as needed.  Review of 2D echocardiogram from 04/22/2022 shows LV ejection fraction of 30 to 35% with global hypokinesis and grade 1 diastolic dysfunction and akinesis of the left ventricular apical segment of anterior wall.  Restart Coreg  Type 2 diabetes mellitus with other specified  complication ([place]) Controlled.  Hemoglobin A1c from 04/22/2022 at 5.8.  CKD (chronic kidney disease), stage III (HCC) Creatinine likely at baseline. Creatinine today at 1.2.  Hyperlipidemia with target LDL less than 70 Continue crestor.  Lipid panel from 04/22/2022 shows LDL of 69.  Essential hypertension Patient was on  Coreg at home.  Patient was  on permissive hypertension for the first 48 hours.  Latest blood pressure of 129/64.  Restart Coreg today.  Mild hypokalemia.  Potassium of 4.0.  Has been replenished and improved    DVT prophylaxis: enoxaparin (LOVENOX) injection 40 mg Start: 04/23/22 1500 SCDs Start: 04/23/22 1319 SCD's Start: 04/21/22 1606   Code Status:     Code Status: Full Code  Disposition:  CIR as per PT evaluation after surgical intervention.  Status is: Inpatient  Remains inpatient appropriate because: Significant carotid stenosis with stroke, possible need for vascular intervention,   Family Communication:  Spoke with the patient's wife at bedside.  Consultants:  Neurology Vascular surgery  Procedures:  None  Antimicrobials:  None  Anti-infectives (From admission, onward)    None       Subjective: Today, patient was seen and examined at bedside.  Appears to be little sleepy but able to comprehend more.  Verbalized some.  Objective: Vitals:   04/26/22 0007 04/26/22 0442 04/26/22 0726 04/26/22 1108  BP: (!) 119/52 (!) 136/48 (!) 140/56 133/62  Pulse: 63 60 62 68  Resp: '18 16 17 16  '$ Temp: 98.2 F (36.8 C) 98.4 F (36.9 C) 97.9 F (36.6 C) 97.6 F (36.4 C)  TempSrc: Oral Axillary Oral Oral  SpO2: 94% 93% 97% 97%  Weight:      Height:       No intake or output data in the 24 hours ending 04/26/22 1433  Filed Weights   04/22/22 0100  Weight: 87.5 kg    Physical Examination: Body mass index is 29.33 kg/m.   General:  Average built, not in obvious distress, mildly verbal, hard of hearing HENT:   No scleral pallor or icterus noted. Oral mucosa is moist.  Chest:  Clear breath sounds.  Diminished breath sounds bilaterally. No crackles or wheezes.  CVS: S1 &S2 heard. No murmur.  Regular rate and rhythm. Abdomen: Soft, nontender, nondistended.  Bowel sounds are heard.   Extremities: No cyanosis, clubbing or edema.  Peripheral pulses are  palpable. Psych: Alert, awake and able to comprehend some, squeezing hands. CNS: Aphasia, moves extremities, hard of hearing   Skin: Warm and dry.  No rashes noted.  Data Reviewed:   CBC: Recent Labs  Lab 04/21/22 1317 04/21/22 1326 04/22/22 0247 04/22/22 1520 04/23/22 0201 04/24/22 0244 04/25/22 0336  WBC 8.8  --   --   --  9.2 10.1 8.9  NEUTROABS 5.3  --   --   --   --   --   --   HGB 13.6   < > 13.3 13.3 12.9* 14.3 13.7  HCT 41.5   < > 39.0 39.0 39.4 43.0 40.5  MCV 93.3  --   --   --  92.1 90.5 90.2  PLT 215  --   --   --  190 223 216   < > = values in this interval not displayed.     Basic Metabolic Panel: Recent Labs  Lab 04/22/22 0151 04/22/22 0247 04/22/22 1520 04/23/22 0201 04/24/22 0244 04/25/22 0336 04/26/22 0831  NA 142   < > 142 141 141 140 141  K 4.2   < >  3.3* 3.6 3.5 3.3* 4.0  CL 104  --   --  107 102 102 107  CO2 24  --   --  '25 26 27 27  '$ GLUCOSE 211*  --   --  103* 101* 96 97  BUN 17  --   --  26* 22 24* 23  CREATININE 1.63*  --   --  1.36* 1.28* 1.24 1.28*  CALCIUM 9.1  --   --  9.0 9.1 9.1 9.2  MG  --   --   --   --   --  2.2  --    < > = values in this interval not displayed.     Liver Function Tests: Recent Labs  Lab 04/21/22 1317  AST 24  ALT 20  ALKPHOS 42  BILITOT 0.6  PROT 7.4  ALBUMIN 3.6      Radiology Studies: VAS US CAROTID  Result Date: 04/25/2022 Carotid Arterial Duplex Study Patient Name:  RONELL DUFFUS  Date of Exam:   04/25/2022 Medical Rec #: [id number]         Accession #:    [id number] Date of Birth: 02-28-1946          Patient Gender: M Patient Age:   3 years Exam Location:  [facility] Procedure:      VAS US CAROTID Referring Phys: Harrell Gave DICKSON --------------------------------------------------------------------------------  Indications:       Carotid artery disease. Risk Factors:      Hypertension, hyperlipidemia, Diabetes, past history of                    smoking, coronary artery disease.  Limitations        Today's exam was limited due to the patient's inability or                    unwillingness to cooperate. Comparison Study:  10/23/2021 carotid artery duplex- right 1-39% ICA stenosis,                    left 40-59% ICA stenosis. Performing Technologist: Maudry Mayhew MHA, RDMS, RVT, RDCS Supporting Technologist: Rogelia Rohrer RVT, RDMS  Examination Guidelines: A complete evaluation includes B-mode imaging, spectral Doppler, color Doppler, and power Doppler as needed of all accessible portions of each vessel. Bilateral testing is considered an integral part of a complete examination. Limited examinations for reoccurring indications may be performed as noted.  Right Carotid Findings: +----------+--------+-------+--------+----------------------+------------------+           PSV cm/sEDV    StenosisPlaque Description    Comments                             cm/s                                                    +----------+--------+-------+--------+----------------------+------------------+ CCA Prox  66      10                                   intimal thickening +----------+--------+-------+--------+----------------------+------------------+ CCA Distal88      19             smooth and  heterogenous                             +----------+--------+-------+--------+----------------------+------------------+ ICA Prox  123     29             smooth and                                                                heterogenous                             +----------+--------+-------+--------+----------------------+------------------+ ICA Distal80      23                                                      +----------+--------+-------+--------+----------------------+------------------+ +----------+--------+-------+------------+-------------------+           PSV cm/sEDV cmsDescribe     Arm Pressure (mmHG) +----------+--------+-------+------------+-------------------+ Subclavian               Not assessed                    +----------+--------+-------+------------+-------------------+ +---------+--------+--------+------------+ VertebralPSV cm/sEDV cm/sNot assessed +---------+--------+--------+------------+  Left Carotid Findings: +----------+--------+--------+--------+--------------------------+--------+           PSV cm/sEDV cm/sStenosisPlaque Description        Comments +----------+--------+--------+--------+--------------------------+--------+ CCA Prox  89      16                                                 +----------+--------+--------+--------+--------------------------+--------+ CCA Distal76      12                                                 +----------+--------+--------+--------+--------------------------+--------+ ICA Prox  209     55      40-59%  heterogenous and irregular         +----------+--------+--------+--------+--------------------------+--------+ ICA Mid   207     40              heterogenous and irregular         +----------+--------+--------+--------+--------------------------+--------+ ICA Distal161     31                                                 +----------+--------+--------+--------+--------------------------+--------+ ECA       126     13                                                 +----------+--------+--------+--------+--------------------------+--------+ +----------+--------+--------+------------+-------------------+  PSV cm/sEDV cm/sDescribe    Arm Pressure (mmHG) +----------+--------+--------+------------+-------------------+ Subclavian                Not assessed                    +----------+--------+--------+------------+-------------------+ +---------+--------+--+--------+--+---------+ VertebralPSV cm/s76EDV cm/s13Antegrade  +---------+--------+--+--------+--+---------+   Summary:   *See table(s) above for measurements and observations.  Electronically signed by Monica Martinez MD on 04/25/2022 at 2:20:40 PM.    Final       LOS: 4 days    Flora Lipps, MD [facility] Available via Epic secure chat 7am-7pm After these hours, please refer to coverage provider listed on [email or web address] 04/26/2022, 2:33 PM

## 2022-04-26 NOTE — Progress Notes (Signed)
VASCULAR SURGERY:  ADDENDUM: The patient is really not showing a lot of improvement clinically over the last several days.  I reviewed the MRI again with neuroradiology.  He has a moderate left parietal infarct.  In order to proceed with TCAR on Tuesday he would have to show significant clinical improvement over the next several days.  If he has not shown significant improvement I would favor not proceeding with TCAR and seeing him to follow-up in 6 to 8 weeks to reconsider this.  I will check back on Monday.  If any questions over the weekend Dr. Stanford Breed is on-call.  [name], MD 4:36 PM

## 2022-04-26 NOTE — Progress Notes (Addendum)
STROKE TEAM PROGRESS NOTE   INTERVAL HISTORY Patient evaluated at bedside this AM. Wife shares that patient has been moody and uncooperative this morning. He had a muffin for breakfast. Patient requested coffee and water during evaluation, remarked on the awful taste of the coffee. Speech improved, less aphasic. It was easier to coax the patient into participating in the evaluation today. Unclear if there is right sided visual neglect along with his R sided hemianopia.   Vitals:   04/26/22 0442 04/26/22 0726 04/26/22 1108 04/26/22 1530  BP: (!) 136/48 (!) 140/56 133/62 131/68  Pulse: 60 62 68 76  Resp: '16 17 16 17  '$ Temp: 98.4 F (36.9 C) 97.9 F (36.6 C) 97.6 F (36.4 C) 99.7 F (37.6 C)  TempSrc: Axillary Oral Oral Oral  SpO2: 93% 97% 97% 95%  Weight:      Height:       CBC:  Recent Labs  Lab 04/21/22 1317 04/21/22 1326 04/24/22 0244 04/25/22 0336  WBC 8.8   < > 10.1 8.9  NEUTROABS 5.3  --   --   --   HGB 13.6   < > 14.3 13.7  HCT 41.5   < > 43.0 40.5  MCV 93.3   < > 90.5 90.2  PLT 215   < > 223 216   < > = values in this interval not displayed.   Basic Metabolic Panel:  Recent Labs  Lab 04/25/22 0336 04/26/22 0831  NA 140 141  K 3.3* 4.0  CL 102 107  CO2 27 27  GLUCOSE 96 97  BUN 24* 23  CREATININE 1.24 1.28*  CALCIUM 9.1 9.2  MG 2.2  --    Lipid Panel:  Recent Labs  Lab 04/22/22 0151  CHOL 124  TRIG 103  HDL 34*  CHOLHDL 3.6  VLDL 21  LDLCALC 69   HgbA1c:  Recent Labs  Lab 04/22/22 0151  HGBA1C 5.8*   Urine Drug Screen:  Recent Labs  Lab 04/21/22 1442  LABOPIA NONE DETECTED  COCAINSCRNUR NONE DETECTED  LABBENZ NONE DETECTED  AMPHETMU NONE DETECTED  THCU NONE DETECTED  LABBARB NONE DETECTED    Alcohol Level  Recent Labs  Lab 04/21/22 1317  ETH <10    PHYSICAL EXAM GENERAL: Awake, alert, in no acute distress LUNGS: Normal respiratory effort. Non-labored breathing on room air CV: Regular rate and rhythm on telemetry   NEURO:   Mental Status: Awake, oriented to self, able to spell first name. Says he is in the hospital. States it is 2023.   Speech/Language: receptive aphasia improving, able to answer questions and follow verbal commands. Repetition still poor.  Cranial Nerves:  II: PERRL. Right hemianopia w/ possible right sided visual neglect III, IV, VI: Eyes open today. Lid elevation symmetric and full.  V: Sensation is intact to light touch and symmetrical to face. Blinks to threat. Moves jaw back and forth.  VII: Symmetric facial movements VIII: Hearing intact to voice IX, X: Phonation intact XII: Tongue protrudes midline without fasciculations.   Motor: 5/5 strength in all muscle groups.  Tone is normal. Bulk is normal.  Sensation: Intact to light touch bilaterally in all four extremities. No extinction to DSS present.  Coordination: UTA 2/2 inability to follow commands Gait: Deferred  ASSESSMENT/PLAN [name] is a 76 y.o. male with history of CAD, previous L occipital infarct on plavix, STEMI, stroke with residual right sided visual impairment, carotid stenosis with stent, ischemic cardiomyopathy, HTN and HLD presents with sudden onset  global aphasia, expressive worse than receptive. Initial NIH 8. Patient had a rapid response on 9/18 and was emergently transferred to ICU and placed on bipap with improvement of respiratory status.  Patient awake today, much more cooperative, able to answer questions with encouragement. Speech is more spontaneous, intermittently asks " what do you want me to do?".   Stroke:  Acute ischemic L MCA infarct, etiology likely from left ICA stenosis/plaque  Code Stroke CT head No acute abnormality.  CTA head & neck 50% diameter stenosis proximal left internal carotid artery. Diminutive right vertebral artery which appears diffusely diseased and has minimal contribution to the basilar. MRI  Acute infarct left parietal lobe without hemorrhage 2D Echo LVEF 30-35%  CUS  left ICA 40-59% stenosis Will need 30-day CardioNet monitoring to rule out A-fib as outpatient LDL 69 HgbA1c 5.8 VTE prophylaxis - SCDs clopidogrel 75 mg daily prior to admission, now on Aspirin 81 mg and clopidogrel 75 mg Therapy recommendations: CIR candidate Disposition: pending  Combined systolic and diastolic heart failure Acute pulmonary edema Home meds: Coreg 12.5 mg, Losartan 25 mg, Lasix 20 mg 05/2015 EF 20 to 25% initially but improved to 35 to 40% later 11/2016 EF 30 to 35% Pt has declined ICD in the past with Dr. Ellyn Hack This admission EF 30 to 35% 9/17 developed respiratory distress, transfer to ICU, put on BiPAP, doing well, now off BiPAP  Carotid arterial disease ([place]) Followed by vascular surgery OP Last carotid US in 10/2021 left ICA: 40-59% stenosis  This admission CT head and neck 50% left ICA stenosis CUS this admission - left ICA 40-59% stenosis Continue plavix and statin  Tentative left TCAR next week, Dr. Scot Dock following.   History of stroke 05/2015 admitted for confusion and right hemianopia, CT no acute abnormality.  Status post tPA, then developed GI bleeding due to recent polypectomy and tPA was reversed.  MRI showed left PCA infarct.  CTA head and neck left P2/P3 occlusion.  EF 20 to 25% but later improved to 35 to 40%.  LDL 186, A1c 6.0.  Discharged on DAPT and Crestor.  Residual right hemianopia.  However, wife stated that patient was driving prior to hospitalization.   Hypertension Home meds:  Losartan 25 mg BP fluctuate Long-term BP goal normotensive  Hyperlipidemia Home meds:  Crestor 40 mg, resumed in hospital LDL 69, goal < 70 Continue statin at discharge  Tobacco abuse Current smoker Smoking cessation counseling will be provided  Other Stroke Risk Factors Advanced Age >/= 55  Coronary artery disease, STEMI status post stenting  Other Active Problems AKI- resolved  Post Stroke Depression - started zoloft, 12.5 mg today and 25 mg  tomorrow to minimize side effects  Hospital day # 4  Christene Slates, MD PGY-1   ATTENDING NOTE: I reviewed above note and agree with the assessment and plan. Pt was seen and examined.   Wife at the bedside. Pt lying in bed, seems to have more language output and able to answer questions appropriately in short words in a delayed fashion and with prompt. Aphasia is slowing improving. Dr. Scot Dock on board for tentative left TCAR next Tuesday but would like him to be more improved over weekend. Continue DAPT and statin, still recommend 30 day cardiac event monitoring as outpt.   For detailed assessment and plan, please refer to above/below as I have made changes wherever appropriate.   Neurology will sign off. Please call with questions. Pt will follow up with stroke clinic NP at [facility]  in about 4 weeks. Thanks for the consult.   [name], MD PhD Stroke Neurology 04/26/2022 9:48 PM   To contact Stroke Continuity provider, please refer to [email or web address] After hours, contact General Neurology

## 2022-04-27 DIAGNOSIS — I2510 Atherosclerotic heart disease of native coronary artery without angina pectoris: Secondary | ICD-10-CM | POA: Diagnosis not present

## 2022-04-27 DIAGNOSIS — I10 Essential (primary) hypertension: Secondary | ICD-10-CM | POA: Diagnosis not present

## 2022-04-27 DIAGNOSIS — I6522 Occlusion and stenosis of left carotid artery: Secondary | ICD-10-CM | POA: Diagnosis not present

## 2022-04-27 DIAGNOSIS — I5040 Unspecified combined systolic (congestive) and diastolic (congestive) heart failure: Secondary | ICD-10-CM | POA: Diagnosis not present

## 2022-04-27 LAB — BASIC METABOLIC PANEL
Anion gap: 7 (ref 5–15)
BUN: 19 mg/dL (ref 8–23)
CO2: 25 mmol/L (ref 22–32)
Calcium: 8.7 mg/dL — ABNORMAL LOW (ref 8.9–10.3)
Chloride: 107 mmol/L (ref 98–111)
Creatinine, Ser: 1.17 mg/dL (ref 0.61–1.24)
GFR, Estimated: >60 mL/min (ref >60)
Glucose, Bld: 117 mg/dL — ABNORMAL HIGH (ref 70–99)
Potassium: 3.6 mmol/L (ref 3.5–5.1)
Sodium: 139 mmol/L (ref 135–145)

## 2022-04-27 LAB — GLUCOSE, CAPILLARY
Glucose-Capillary: 132 mg/dL — ABNORMAL HIGH (ref 70–99)
Glucose-Capillary: 155 mg/dL — ABNORMAL HIGH (ref 70–99)
Glucose-Capillary: 132 mg/dL — ABNORMAL HIGH (ref 70–99)
Glucose-Capillary: 119 mg/dL — ABNORMAL HIGH (ref 70–99)

## 2022-04-27 LAB — CBC
HCT: 40.1 % (ref 39.0–52.0)
Hemoglobin: 13.3 g/dL (ref 13.0–17.0)
MCH: 30.5 pg (ref 26.0–34.0)
MCHC: 33.2 g/dL (ref 30.0–36.0)
MCV: 92 fL (ref 80.0–100.0)
Platelets: 204 10*3/uL (ref 150–400)
RBC: 4.36 MIL/uL (ref 4.22–5.81)
RDW: 12.2 % (ref 11.5–15.5)
WBC: 9.6 10*3/uL (ref 4.0–10.5)
nRBC: 0 % (ref 0.0–0.2)

## 2022-04-27 MED ORDER — QUETIAPINE FUMARATE 25 MG PO TABS: 12.5000 mg | ORAL_TABLET | Freq: Every day | ORAL | Status: DC | PRN

## 2022-04-27 NOTE — Plan of Care (Signed)
  Problem: Education: Goal: Ability to describe self-care measures that may prevent or decrease complications (Diabetes Survival Skills Education) will improve Outcome: Progressing Goal: Individualized Educational Video(s) Outcome: Progressing   Problem: Coping: Goal: Ability to adjust to condition or change in health will improve Outcome: Progressing   Problem: Fluid Volume: Goal: Ability to maintain a balanced intake and output will improve Outcome: Progressing   Problem: Health Behavior/Discharge Planning: Goal: Ability to identify and utilize available resources and services will improve Outcome: Progressing Goal: Ability to manage health-related needs will improve Outcome: Progressing   Problem: Metabolic: Goal: Ability to maintain appropriate glucose levels will improve Outcome: Progressing   Problem: Nutritional: Goal: Maintenance of adequate nutrition will improve Outcome: Progressing Goal: Progress toward achieving an optimal weight will improve Outcome: Progressing   Problem: Skin Integrity: Goal: Risk for impaired skin integrity will decrease Outcome: Progressing   Problem: Tissue Perfusion: Goal: Adequacy of tissue perfusion will improve Outcome: Progressing   Problem: Education: Goal: Knowledge of General Education information will improve Description: Including pain rating scale, medication(s)/side effects and non-pharmacologic comfort measures Outcome: Progressing   Problem: Health Behavior/Discharge Planning: Goal: Ability to manage health-related needs will improve Outcome: Progressing   Problem: Clinical Measurements: Goal: Ability to maintain clinical measurements within normal limits will improve Outcome: Progressing Goal: Will remain free from infection Outcome: Progressing Goal: Diagnostic test results will improve Outcome: Progressing Goal: Respiratory complications will improve Outcome: Progressing Goal: Cardiovascular complication will  be avoided Outcome: Progressing   Problem: Activity: Goal: Risk for activity intolerance will decrease Outcome: Progressing   Problem: Nutrition: Goal: Adequate nutrition will be maintained Outcome: Progressing   Problem: Coping: Goal: Level of anxiety will decrease Outcome: Progressing   Problem: Elimination: Goal: Will not experience complications related to bowel motility Outcome: Progressing Goal: Will not experience complications related to urinary retention Outcome: Progressing   Problem: Pain Managment: Goal: General experience of comfort will improve Outcome: Progressing   Problem: Safety: Goal: Ability to remain free from injury will improve Outcome: Progressing   Problem: Skin Integrity: Goal: Risk for impaired skin integrity will decrease Outcome: Progressing   Problem: Safety: Goal: Non-violent Restraint(s) Outcome: Progressing   Problem: Education: Goal: Knowledge of secondary prevention will improve (SELECT ALL) Outcome: Progressing Goal: Knowledge of patient specific risk factors will improve (INDIVIDUALIZE FOR PATIENT) Outcome: Progressing

## 2022-04-27 NOTE — Progress Notes (Signed)
PROGRESS NOTE    Damon Walker  IHW:[id number] DOB: [birth year] DOA: 04/21/2022 PCP: [name], MD    Brief Narrative:  Damon Walker is a 76 y.o. male with past medical history significant of carotid artery disease, hx of CVA, combined systolic and diastolic CHF, history of carotid artery stenosis hypertension, hyperlipidemia, type 2 diabetes, CKD stage III,presented to the hospital with difficulty speech, some confusion and not acting himself.    Code stroke was activated.  In the ED, blood pressure was slightly elevated.  Patient had global aphasia.  He was out of the window for TNK.  Creatinine was 1.3.  CT head scan did not show any acute findings.  CTA head and neck showed perfusion abnormality in the right temporal and left parietal lobe.  No large vessel occlusion was noted but carotid stenosis noted.  MRI of the brain showed left parietal lobe infarct without hemorrhage.  Patient was then admitted hospital for further evaluation and treatment.  On the night of admission patient was confused and was noted to be hypoxic and diaphoretic so was transferred to the ICU.  ABG showed a pH of 7.1.  Patient was put on nonrebreather mask initially.  He was put on BiPAP subsequently.  BiPAP has been weaned off and patient has been transferred back to medical service at this time.  At this time, patient continues to have some confusion but was able to do carotid duplex.  Vascular surgery planning for TCAR on Tuesday.  Patient continues to improve..     Assessment and Plan: Principal Problem:   Acute CVA (cerebrovascular accident) ([place]) Active Problems:   CAD-native artery   Essential hypertension   Hyperlipidemia with target LDL less than 70   Carotid arterial disease ([place])   CKD (chronic kidney disease), stage III ([place])   Type 2 diabetes mellitus with other specified complication (HCC)   Combined systolic and diastolic heart failure (HCC)   Acute respiratory failure with hypoxia and  hypercapnia (HCC)   Hypokalemia   * Acute CVA (cerebrovascular accident) ([place]) MRI brain showed left parietal lobe infarct without hemorrhage.  Neurology followed the patient during hospitalization.  CTA neck showed significant carotid stenosis and significant plaque in the left internal carotid artery.  Carotid duplex was also performed.  Vascular surgery on board and plan for TCAR on Tuesday..  Continue aspirin Plavix and Crestor 40 mg daily.  Speech therapy has recommended regular consistency diet.  Confusion and disorientation possible delirium.   Not require yesterday evening and appears to be more alert awake and calm.  Family at bedside.  Seroquel has been decreased to 12.5 mg as needed.  Carotid arterial disease ([place]) Patient had carotid ultrasound on 10/2021 showed: right ICA 1-39% stenosis, left ICA: 40-59% stenosis.  CTA at this time showed extensive atherosclerotic disease in the carotid bilaterally and 50% stenosis in the proximal left internal carotid artery.    Continue aspirin and Plavix.  As for vascular surgery patient is not a candidate for surgery but potential for TCAR.  Continue Coreg.  History of coronary artery disease-STEMI post drug-eluting stent in the past. Continue statins and Coreg.  Combined systolic and diastolic heart failure (HCC) Currently euvolemic.  On Lasix at home as needed.  Review of 2D echocardiogram from 04/22/2022 shows LV ejection fraction of 30 to 35% with global hypokinesis and grade 1 diastolic dysfunction and akinesis of the left ventricular apical segment of anterior wall.  Continue Coreg  Type 2 diabetes mellitus with other specified complication (  [place]) Controlled.  Hemoglobin A1c from 04/22/2022 at 5.8.  CKD (chronic kidney disease), stage III (HCC) Creatinine likely at baseline. Creatinine today at 1.2.  Hyperlipidemia with target LDL less than 70 Continue crestor.  Lipid panel from 04/22/2022 shows LDL of 69.  Essential hypertension Was on  permissive hypertension for the first 48 hours.  Has been resumed on Coreg  Mild hypokalemia.  Latest potassium of 3.6.    DVT prophylaxis: enoxaparin (LOVENOX) injection 40 mg Start: 04/23/22 1500 SCDs Start: 04/23/22 1319 SCD's Start: 04/21/22 1606   Code Status:     Code Status: Full Code  Disposition:  CIR as per PT evaluation after surgical intervention.  Status is: Inpatient  Remains inpatient appropriate because: Significant carotid stenosis with stroke, plan for TCAR on Tuesday if clinically improved   Family Communication:  I again spoke with the patient's wife at bedside.  Consultants:  Neurology Vascular surgery  Procedures:  None  Antimicrobials:  None  Anti-infectives (From admission, onward)    None       Subjective: Today, patient was seen and examined at bedside.  Is more alert awake in mildly Communicative as per the patient's wife at bedside.  Able to comprehend better.  Was more calm.  Did not require Seroquel yesterday.  Objective: Vitals:   04/26/22 2314 04/27/22 0421 04/27/22 0746 04/27/22 1110  BP: (!) 140/64 (!) 133/53 (!) 107/51 (!) 124/57  Pulse: 70 73 67 60  Resp: '20 20 20 16  '$ Temp: 98.5 F (36.9 C) (!) 97.5 F (36.4 C) 98.9 F (37.2 C) 97.8 F (36.6 C)  TempSrc: Oral Oral Axillary Oral  SpO2: (!) 89% 93% 94% 94%  Weight:  82.5 kg    Height:        Intake/Output Summary (Last 24 hours) at 04/27/2022 1457 Last data filed at 04/27/2022 0746 Gross per 24 hour  Intake 480 ml  Output 0 ml  Net 480 ml    Filed Weights   04/22/22 0100 04/27/22 0421  Weight: 87.5 kg 82.5 kg    Physical Examination: Body mass index is 27.65 kg/m.   General:  Average built, not in obvious distress, mildly verbal, hard of hearing, follows commands HENT:   No scleral pallor or icterus noted. Oral mucosa is moist.  Chest:  Clear breath sounds.  Diminished breath sounds bilaterally. No crackles or wheezes.  CVS: S1 &S2 heard. No murmur.   Regular rate and rhythm. Abdomen: Soft, nontender, nondistended.  Bowel sounds are heard.   Extremities: No cyanosis, clubbing or edema.  Peripheral pulses are palpable. Psych: Alert, awake and able to comprehend some, squeezing hands. CNS: Has aphasia, moves extremities and squeezes hands, follows some commands, able to verbalize some intermittently. Skin: Warm and dry.  No rashes noted.  Data Reviewed:   CBC: Recent Labs  Lab 04/21/22 1317 04/21/22 1326 04/22/22 1520 04/23/22 0201 04/24/22 0244 04/25/22 0336 04/27/22 0854  WBC 8.8  --   --  9.2 10.1 8.9 9.6  NEUTROABS 5.3  --   --   --   --   --   --   HGB 13.6   < > 13.3 12.9* 14.3 13.7 13.3  HCT 41.5   < > 39.0 39.4 43.0 40.5 40.1  MCV 93.3  --   --  92.1 90.5 90.2 92.0  PLT 215  --   --  190 223 216 204   < > = values in this interval not displayed.     Basic Metabolic Panel:  Recent Labs  Lab 04/23/22 0201 04/24/22 0244 04/25/22 0336 04/26/22 0831 04/27/22 0854  NA 141 141 140 141 139  K 3.6 3.5 3.3* 4.0 3.6  CL 107 102 102 107 107  CO2 '25 26 27 27 25  '$ GLUCOSE 103* 101* 96 97 117*  BUN 26* 22 24* 23 19  CREATININE 1.36* 1.28* 1.24 1.28* 1.17  CALCIUM 9.0 9.1 9.1 9.2 8.7*  MG  --   --  2.2  --   --      Liver Function Tests: Recent Labs  Lab 04/21/22 1317  AST 24  ALT 20  ALKPHOS 42  BILITOT 0.6  PROT 7.4  ALBUMIN 3.6      Radiology Studies: No results found.    LOS: 5 days    Flora Lipps, MD [facility] Available via Epic secure chat 7am-7pm After these hours, please refer to coverage provider listed on [email or web address] 04/27/2022, 2:57 PM

## 2022-04-28 DIAGNOSIS — I5040 Unspecified combined systolic (congestive) and diastolic (congestive) heart failure: Secondary | ICD-10-CM | POA: Diagnosis not present

## 2022-04-28 DIAGNOSIS — I6522 Occlusion and stenosis of left carotid artery: Secondary | ICD-10-CM | POA: Diagnosis not present

## 2022-04-28 DIAGNOSIS — I2510 Atherosclerotic heart disease of native coronary artery without angina pectoris: Secondary | ICD-10-CM | POA: Diagnosis not present

## 2022-04-28 DIAGNOSIS — I10 Essential (primary) hypertension: Secondary | ICD-10-CM | POA: Diagnosis not present

## 2022-04-28 LAB — GLUCOSE, CAPILLARY
Glucose-Capillary: 108 mg/dL — ABNORMAL HIGH (ref 70–99)
Glucose-Capillary: 130 mg/dL — ABNORMAL HIGH (ref 70–99)
Glucose-Capillary: 125 mg/dL — ABNORMAL HIGH (ref 70–99)
Glucose-Capillary: 119 mg/dL — ABNORMAL HIGH (ref 70–99)

## 2022-04-28 NOTE — Progress Notes (Signed)
PROGRESS NOTE    Damon Walker  PFX:[id number] DOB: March 26, 1946 DOA: 04/21/2022 PCP: Damon Pepper, MD    Brief Narrative:  Damon Walker is a 76 y.o. male with past medical history significant of carotid artery disease, hx of CVA, combined systolic and diastolic CHF, history of carotid artery stenosis hypertension, hyperlipidemia, type 2 diabetes, CKD stage III,presented to the hospital with difficulty speech, some confusion and not acting himself.    Code stroke was activated.  In the ED, blood pressure was slightly elevated.  Patient had global aphasia.  He was out of the window for TNK.  Creatinine was 1.3.  CT head scan did not show any acute findings.  CTA head and neck showed perfusion abnormality in the right temporal and left parietal lobe.  No large vessel occlusion was noted but carotid stenosis noted.  MRI of the brain showed left parietal lobe infarct without hemorrhage.  Patient was then admitted hospital for further evaluation and treatment.  On the night of admission patient was confused and was noted to be hypoxic and diaphoretic so was transferred to the ICU.  ABG showed a pH of 7.1.  Patient was put on nonrebreather mask initially.  He was put on BiPAP subsequently.  BiPAP has been weaned off and patient has been transferred back to medical service at this time.  At this time, patient had carotid duplex.  Vascular surgery planning for TCAR on Tuesday.     Assessment and Plan: Principal Problem:   Acute CVA (cerebrovascular accident) ([place]) Active Problems:   CAD-native artery   Essential hypertension   Hyperlipidemia with target LDL less than 70   Carotid arterial disease ([place])   CKD (chronic kidney disease), stage III ([place])   Type 2 diabetes mellitus with other specified complication (HCC)   Combined systolic and diastolic heart failure (HCC)   Acute respiratory failure with hypoxia and hypercapnia (HCC)   Hypokalemia   * Acute CVA (cerebrovascular accident)  ([place]) MRI brain showed left parietal lobe infarct without hemorrhage.  Neurology followed the patient during hospitalization.  CTA neck showed significant carotid stenosis and significant plaque in the left internal carotid artery.  Carotid duplex was also performed.  Vascular surgery on board and plan for TCAR on 04/30/2022 continue aspirin,Plavix and Crestor 40 mg daily.  Speech therapy has recommended regular consistency diet.  Confusion and disorientation possible delirium.    Seroquel has been decreased to 12.5 mg as needed.  Has not required that mentation has improved at this time.  Carotid arterial disease ([place]) Patient had carotid ultrasound on 10/2021 showed: right ICA 1-39% stenosis, left ICA: 40-59% stenosis.  CTA at this time showed extensive atherosclerotic disease in the carotid bilaterally and 50% stenosis in the proximal left internal carotid artery.    Continue aspirin and Plavix.  As for vascular surgery, patient is not a candidate for surgery but potential for TCAR.  Continue Coreg.  History of coronary artery disease-STEMI post drug-eluting stent in the past. Continue statins and Coreg.  Combined systolic and diastolic heart failure (HCC) Currently euvolemic.  On Lasix at home as needed.  Review of 2D echocardiogram from 04/22/2022 shows LV ejection fraction of 30 to 35% with global hypokinesis and grade 1 diastolic dysfunction and akinesis of the left ventricular apical segment of anterior wall.  Continue Coreg  Type 2 diabetes mellitus with other specified complication ([place]) Controlled.  Hemoglobin A1c from 04/22/2022 at 5.8.  CKD (chronic kidney disease), stage III (HCC) Creatinine likely at baseline. Creatinine  today at 1.1.  Hyperlipidemia  with target LDL less than 70 Continue crestor.  Lipid panel from 04/22/2022 shows LDL of 69.  Essential hypertension Was on permissive hypertension for the first 48 hours.  Has been resumed on Coreg.  Blood pressure better  now  Mild hypokalemia.  Latest potassium of 3.6.    DVT prophylaxis: enoxaparin (LOVENOX) injection 40 mg Start: 04/23/22 1500 SCDs Start: 04/23/22 1319 SCD's Start: 04/21/22 1606   Code Status:     Code Status: Full Code  Disposition:  CIR as per PT evaluation after surgical intervention.  Status is: Inpatient  Remains inpatient appropriate because: Significant carotid stenosis with stroke, plan for TCAR on 9/26   Family Communication:  I again spoke with the patient's wife at bedside.  Consultants:  Neurology Vascular surgery  Procedures:  None  Antimicrobials:  None  Anti-infectives (From admission, onward)    None       Subjective: Today, patient was seen and examined at bedside.  Patient's wife at bedside.  Patient is more verbal and able to walk to the bathroom by himself.  Did not require any sedation overnight.  Clinically improving   Objective: Vitals:   04/27/22 1540 04/27/22 1931 04/27/22 2341 04/28/22 0334  BP: (!) 140/58 (!) 120/52 106/80 (!) 114/95  Pulse: 64 60 66 65  Resp: '17 16 17 18  '$ Temp: 98.5 F (36.9 C) 98.4 F (36.9 C) 98.1 F (36.7 C) 98.2 F (36.8 C)  TempSrc: Other (Comment) Oral Oral Oral  SpO2: 96% 91% 95% 96%  Weight:      Height:        Intake/Output Summary (Last 24 hours) at 04/28/2022 0812 Last data filed at 04/28/2022 0357 Gross per 24 hour  Intake 50 ml  Output 250 ml  Net -200 ml    Filed Weights   04/22/22 0100 04/27/22 0421  Weight: 87.5 kg 82.5 kg    Physical Examination: Body mass index is 27.65 kg/m.   General:  Average built, not in obvious distress, hard of hearing, follows commands, gait, HENT:   No scleral pallor or icterus noted. Oral mucosa is moist.  Chest:  Clear breath sounds.  Diminished breath sounds bilaterally. No crackles or wheezes.  CVS: S1 &S2 heard. No murmur.  Regular rate and rhythm. Abdomen: Soft, nontender, nondistended.  Bowel sounds are heard.   Extremities: No cyanosis,  clubbing or edema.  Peripheral pulses are palpable. Psych: Alert, awake, follows commands, communicative, answering appropriately CNS: Communicative, answering appropriately, hard of hearing, able to ambulate by himself Skin: Warm and dry.  No rashes noted.  Data Reviewed:   CBC: Recent Labs  Lab 04/21/22 1317 04/21/22 1326 04/22/22 1520 04/23/22 0201 04/24/22 0244 04/25/22 0336 04/27/22 0854  WBC 8.8  --   --  9.2 10.1 8.9 9.6  NEUTROABS 5.3  --   --   --   --   --   --   HGB 13.6   < > 13.3 12.9* 14.3 13.7 13.3  HCT 41.5   < > 39.0 39.4 43.0 40.5 40.1  MCV 93.3  --   --  92.1 90.5 90.2 92.0  PLT 215  --   --  190 223 216 204   < > = values in this interval not displayed.     Basic Metabolic Panel: Recent Labs  Lab 04/23/22 0201 04/24/22 0244 04/25/22 0336 04/26/22 0831 04/27/22 0854  NA 141 141 140 141 139  K 3.6 3.5 3.3* 4.0 3.6  CL 107 102 102 107 107  CO2 '25 26 27 27 25  '$ GLUCOSE 103* 101* 96 97 117*  BUN 26* 22 24* 23 19  CREATININE 1.36* 1.28* 1.24 1.28* 1.17  CALCIUM 9.0 9.1 9.1 9.2 8.7*  MG  --   --  2.2  --   --      Liver Function Tests: Recent Labs  Lab 04/21/22 1317  AST 24  ALT 20  ALKPHOS 42  BILITOT 0.6  PROT 7.4  ALBUMIN 3.6      Radiology Studies: No results found.    LOS: 6 days    Flora Lipps, MD [facility] Available via Epic secure chat 7am-7pm After these hours, please refer to coverage provider listed on [email or web address] 04/28/2022, 8:12 AM

## 2022-04-28 NOTE — Plan of Care (Signed)
  Problem: Education: Goal: Knowledge of General Education information will improve Description: Including pain rating scale, medication(s)/side effects and non-pharmacologic comfort measures Outcome: Progressing   Problem: Nutrition: Goal: Adequate nutrition will be maintained Outcome: Progressing   Problem: Safety: Goal: Ability to remain free from injury will improve Outcome: Progressing   Problem: Education: Goal: Knowledge of secondary prevention will improve (SELECT ALL) Outcome: Progressing

## 2022-04-28 NOTE — Plan of Care (Signed)
  Problem: Education: Goal: Ability to describe self-care measures that may prevent or decrease complications (Diabetes Survival Skills Education) will improve Outcome: Progressing   Problem: Coping: Goal: Ability to adjust to condition or change in health will improve Outcome: Progressing   Problem: Skin Integrity: Goal: Risk for impaired skin integrity will decrease Outcome: Progressing   Problem: Tissue Perfusion: Goal: Adequacy of tissue perfusion will improve Outcome: Progressing   Problem: Safety: Goal: Ability to remain free from injury will improve Outcome: Progressing

## 2022-04-29 DIAGNOSIS — I6522 Occlusion and stenosis of left carotid artery: Secondary | ICD-10-CM | POA: Diagnosis not present

## 2022-04-29 DIAGNOSIS — I5040 Unspecified combined systolic (congestive) and diastolic (congestive) heart failure: Secondary | ICD-10-CM | POA: Diagnosis not present

## 2022-04-29 DIAGNOSIS — I2510 Atherosclerotic heart disease of native coronary artery without angina pectoris: Secondary | ICD-10-CM | POA: Diagnosis not present

## 2022-04-29 DIAGNOSIS — I10 Essential (primary) hypertension: Secondary | ICD-10-CM | POA: Diagnosis not present

## 2022-04-29 LAB — BASIC METABOLIC PANEL
Anion gap: 9 (ref 5–15)
BUN: 18 mg/dL (ref 8–23)
CO2: 26 mmol/L (ref 22–32)
Calcium: 9.2 mg/dL (ref 8.9–10.3)
Chloride: 106 mmol/L (ref 98–111)
Creatinine, Ser: 1.14 mg/dL (ref 0.61–1.24)
GFR, Estimated: >60 mL/min (ref >60)
Glucose, Bld: 98 mg/dL (ref 70–99)
Potassium: 4 mmol/L (ref 3.5–5.1)
Sodium: 141 mmol/L (ref 135–145)

## 2022-04-29 LAB — GLUCOSE, CAPILLARY
Glucose-Capillary: 105 mg/dL — ABNORMAL HIGH (ref 70–99)
Glucose-Capillary: 105 mg/dL — ABNORMAL HIGH (ref 70–99)
Glucose-Capillary: 122 mg/dL — ABNORMAL HIGH (ref 70–99)
Glucose-Capillary: 122 mg/dL — ABNORMAL HIGH (ref 70–99)
Glucose-Capillary: 134 mg/dL — ABNORMAL HIGH (ref 70–99)

## 2022-04-29 LAB — CBC
HCT: 41.8 % (ref 39.0–52.0)
Hemoglobin: 13.7 g/dL (ref 13.0–17.0)
MCH: 30.2 pg (ref 26.0–34.0)
MCHC: 32.8 g/dL (ref 30.0–36.0)
MCV: 92.1 fL (ref 80.0–100.0)
Platelets: 215 10*3/uL (ref 150–400)
RBC: 4.54 MIL/uL (ref 4.22–5.81)
RDW: 12.4 % (ref 11.5–15.5)
WBC: 8.5 10*3/uL (ref 4.0–10.5)
nRBC: 0 % (ref 0.0–0.2)

## 2022-04-29 LAB — MAGNESIUM: Magnesium: 2.2 mg/dL (ref 1.7–2.4)

## 2022-04-29 MED ORDER — SODIUM CHLORIDE 0.9 % IV SOLN: 1.5000 g | INTRAVENOUS | Status: DC

## 2022-04-29 MED ORDER — CEFAZOLIN SODIUM-DEXTROSE 2-4 GM/100ML-% IV SOLN
2.0000 g | INTRAVENOUS | Status: AC
  Administered 2022-04-30: 2 g via INTRAVENOUS

## 2022-04-29 NOTE — H&P (View-Only) (Signed)
[facility]  VASCULAR SURGERY ASSESSMENT & PLAN:   ACUTE LEFT PARIETAL STROKE: The patient has made significant improvement over the weekend.  His speech has improved.  He is following commands and has good strength in the upper and lower extremities.  He seems a lot more oriented.  I have discussed the plans to proceed with TCAR tomorrow.  I have tried to contact his wife but have not been successful.  I will try to check in the room later and call her later.  However tentatively we will proceed with left TCAR tomorrow.  He is on aspirin, Plavix, and a statin.  I have written preop orders.  [name], MD 9:45 AM   Subjective  - talking and significant improvement in motor  Objective (!) 129/53 (!) 56 98.5 F (36.9 C) (Oral) 18 94%  Intake/Output Summary (Last 24 hours) at 04/29/2022 0719 Last data filed at 04/29/2022 0400 Gross per 24 hour  Intake 135 ml  Output 200 ml  Net -65 ml   Moving all 4 extremities with no apparent residual weakness Speech clear Follows commands   Assessment/Planning: Left symptomatic carotid stenosis  He has shown significant improvement.  Pending left TCAR with dr. Scot Dock. NPO past MN  Roxy Horseman 04/29/2022 7:19 AM --  Laboratory Lab Results: Recent Labs    04/27/22 0854  WBC 9.6  HGB 13.3  HCT 40.1  PLT 204   BMET Recent Labs    04/26/22 0831 04/27/22 0854  NA 141 139  K 4.0 3.6  CL 107 107  CO2 27 25  GLUCOSE 97 117*  BUN 23 19  CREATININE 1.28* 1.17  CALCIUM 9.2 8.7*    COAG Lab Results  Component Value Date   INR 1.1 04/21/2022   INR 1.22 06/20/2015   INR 1.11 05/14/2015   No results found for: "PTT"  I have interviewed the patient and examined the patient. I agree with the findings by the PA.  [name], MD

## 2022-04-29 NOTE — Progress Notes (Signed)
Physical Therapy Treatment Patient Details Name: Damon Walker MRN: [id number] DOB: June 14, 1946 Today's Date: 04/29/2022   History of Present Illness The pt is a 76 yo male presenting 9/17 with AMS. 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 During night of 9/17 pt found in another room's bathroom, hypoxic, and diaphoretic. Placed on Bipap due to continued respiratory distress. PMH includes: CAD, CHF, HLD, CVA of L PCA in 2016, STEMI with DES x2, and DM II.    PT Comments    Pt with difficulty following commands in the hallway, requiring increased cues for way finding. Pt unable to identify objects or colors in the hallway, and noted L side gaze preference this session. Pt writing first name and year of birth on paper in room at end of session, however still unable to identify wife's name or relationship verbally or in writing. Pt left in chair at end of session with chair alarm set. Positional preference is with RLE crossed over L knee and body turned to the L away from door to look out the window. Pt shaking head and when asked whats wrong, states "I don't know, I'm frustrated." Continue to feel pt would benefit most from intensive, multidisciplinary therapies to maximize functional independence, safety, and decrease burden of care on wife upon return home with her support.    Recommendations for follow up therapy are one component of a multi-disciplinary discharge planning process, led by the attending physician.  Recommendations may be updated based on patient status, additional functional criteria and insurance authorization.  Follow Up Recommendations  Acute inpatient rehab (3hours/day)     Assistance Recommended at Discharge Frequent or constant Supervision/Assistance  Patient can return home with the following Assistance with cooking/housework;Direct supervision/assist for medications management;Direct supervision/assist for financial management;Assist for  transportation;Help with stairs or ramp for entrance;A little help with walking and/or transfers;A little help with bathing/dressing/bathroom;Assistance with feeding   Equipment Recommendations  Other (comment) (defer to next venue of care)    Recommendations for Other Services Rehab consult     Precautions / Restrictions Precautions Precautions: Fall Precaution Comments: HOH - hearing aides in room Restrictions Weight Bearing Restrictions: No     Mobility  Bed Mobility Overal bed mobility: Needs Assistance Bed Mobility: Supine to Sit     Supine to sit: Min guard     General bed mobility comments: Increased time to initiate.Tactile cues to guide pt into transfer to EOB on the R towards me where railing was down. Initially attempting to get up on L side of bed where railing was still up.    Transfers Overall transfer level: Needs assistance Equipment used: 1 person hand held assist Transfers: Sit to/from Stand Sit to Stand: Min assist           General transfer comment: Light assist for balance recovery. Pt with good power up to full stand.    Ambulation/Gait Ambulation/Gait assistance: Min assist Gait Distance (Feet): 200 Feet Assistive device: None Gait Pattern/deviations: Step-through pattern, Decreased stride length, Antalgic, Wide base of support Gait velocity: Decreased Gait velocity interpretation: 1.31 - 2.62 ft/sec, indicative of limited community ambulator   General Gait Details: Pt with L gaze this session and required increased cues to attend to R side, both in hallway and in room. Pt required min assist for balance support and safety.   Stairs             Wheelchair Mobility    Modified Rankin (Stroke Patients Only) Modified Rankin (Stroke  Patients Only) Pre-Morbid Rankin Score: No symptoms Modified Rankin: Moderately severe disability     Balance Overall balance assessment: Needs assistance Sitting-balance support: No upper extremity  supported, Feet supported Sitting balance-Leahy Scale: Good Sitting balance - Comments: Min guard for static sitting balance EOB Postural control: Posterior lean Standing balance support: Single extremity supported, No upper extremity supported, During functional activity Standing balance-Leahy Scale: Fair                              Cognition Arousal/Alertness: Awake/alert Behavior During Therapy: Flat affect Overall Cognitive Status: Impaired/Different from baseline Area of Impairment: Orientation, Attention, Memory, Following commands, Safety/judgement, Awareness, Problem solving                 Orientation Level: Disoriented to, Place, Time, Situation Current Attention Level: Sustained Memory: Decreased short-term memory Following Commands: Follows one step commands consistently, Follows multi-step commands consistently Safety/Judgement: Decreased awareness of safety Awareness: Intellectual Problem Solving: Slow processing, Decreased initiation, Difficulty sequencing, Requires verbal cues, Requires tactile cues General Comments: Global aphasia        Exercises      General Comments        Pertinent Vitals/Pain Pain Assessment Pain Assessment: Faces Faces Pain Scale: No hurt Pain Intervention(s): Monitored during session    Home Living                          Prior Function            PT Goals (current goals can now be found in the care plan section) Acute Rehab PT Goals Patient Stated Goal: did not state. Wife, Mateo Flow present and supportive PT Goal Formulation: Patient unable to participate in goal setting Time For Goal Achievement: 05/06/22 Potential to Achieve Goals: Good Progress towards PT goals: Progressing toward goals    Frequency    Min 4X/week      PT Plan Current plan remains appropriate    Co-evaluation              AM-PAC PT "6 Clicks" Mobility   Outcome Measure  Help needed turning from your  back to your side while in a flat bed without using bedrails?: A Little Help needed moving from lying on your back to sitting on the side of a flat bed without using bedrails?: A Little Help needed moving to and from a bed to a chair (including a wheelchair)?: A Little Help needed standing up from a chair using your arms (e.g., wheelchair or bedside chair)?: A Little Help needed to walk in hospital room?: A Little Help needed climbing 3-5 steps with a railing? : A Little 6 Click Score: 18    End of Session Equipment Utilized During Treatment: Gait belt Activity Tolerance: Patient tolerated treatment well Patient left: in chair;with call bell/phone within reach;with chair alarm set;with family/visitor present Nurse Communication: Mobility status PT Visit Diagnosis: Other abnormalities of gait and mobility (R26.89);Muscle weakness (generalized) (M62.81);Unsteadiness on feet (R26.81);Difficulty in walking, not elsewhere classified (R26.2);Other symptoms and signs involving the nervous system (R29.898)     Time: 7341-9379 PT Time Calculation (min) (ACUTE ONLY): 27 min  Charges:  $Gait Training: 23-37 mins                     Rolinda Roan, PT, DPT Acute Rehabilitation Services Secure Chat Preferred Office: [phone]    Thelma Comp 04/29/2022, 10:53 AM

## 2022-04-29 NOTE — Progress Notes (Addendum)
[facility]  VASCULAR SURGERY ASSESSMENT & PLAN:   ACUTE LEFT PARIETAL STROKE: The patient has made significant improvement over the weekend.  His speech has improved.  He is following commands and has good strength in the upper and lower extremities.  He seems a lot more oriented.  I have discussed the plans to proceed with TCAR tomorrow.  I have tried to contact his wife but have not been successful.  I will try to check in the room later and call her later.  However tentatively we will proceed with left TCAR tomorrow.  He is on aspirin, Plavix, and a statin.  I have written preop orders.  [name], MD 9:45 AM   Subjective  - talking and significant improvement in motor  Objective (!) 129/53 (!) 56 98.5 F (36.9 C) (Oral) 18 94%  Intake/Output Summary (Last 24 hours) at 04/29/2022 0719 Last data filed at 04/29/2022 0400 Gross per 24 hour  Intake 135 ml  Output 200 ml  Net -65 ml   Moving all 4 extremities with no apparent residual weakness Speech clear Follows commands   Assessment/Planning: Left symptomatic carotid stenosis  He has shown significant improvement.  Pending left TCAR with dr. Scot Dock. NPO past MN  Roxy Horseman 04/29/2022 7:19 AM --  Laboratory Lab Results: Recent Labs    04/27/22 0854  WBC 9.6  HGB 13.3  HCT 40.1  PLT 204   BMET Recent Labs    04/26/22 0831 04/27/22 0854  NA 141 139  K 4.0 3.6  CL 107 107  CO2 27 25  GLUCOSE 97 117*  BUN 23 19  CREATININE 1.28* 1.17  CALCIUM 9.2 8.7*    COAG Lab Results  Component Value Date   INR 1.1 04/21/2022   INR 1.22 06/20/2015   INR 1.11 05/14/2015   No results found for: "PTT"  I have interviewed the patient and examined the patient. I agree with the findings by the PA.  [name], MD

## 2022-04-29 NOTE — PMR Pre-admission (Signed)
PMR Admission Coordinator Pre-Admission Assessment  Patient: Damon Walker is an 76 y.o., male MRN: [id number] DOB: [birth year] Height: '5\' 8"'  (172.7 cm) Weight: 82.5 kg  Insurance Information HMO: yes    PPO:      PCP:      IPA:      80/20:      OTHER:  PRIMARY: Blue Medicare      Policy#: [id number]      Subscriber: pt CM Name: Shanon      Phone#: [phone]     Fax#: [phone] Pre-Cert#: TBD  approved for 7 days from admit    Employer:  Benefits:  Phone #: [phone]     Name: 9/25 Eff. Date: 08/05/21     Deduct: none      Out of Pocket Max: $3900      Life Max: none CIR: $295 co pay per day days 1 until 6      SNF: no copay days 1 until 20; $196 co pay per day days 21 until 60; no c copay days 61 until 100 Outpatient: $25 per visit     Co-Pay: visits per medical neccesity Home Health: 100%      Co-Pay: visit per medical neccesity DME: 80%     Co-Pay: 20% Providers: in network  SECONDARY: none  Financial Counselor:       Phone#:   [employer]" for patients in Inpatient Rehabilitation Facilities with attached "Privacy Act [place] Records" was provided and verbally reviewed with: Patient and Family  Emergency Contact Information Contact Information     Name Relation Home Work Mobile   Leverette,Valerie Spouse [phone]  [phone]   Brandt, Chaney   [phone]      Current Medical History  Patient Admitting Diagnosis: CVA  History of Present Illness: 76 year old male with history of carotid artery disease, CVA, combined systolic and diastolic CHF, CAD, HTN, HLD, T2 DM, CKD stage 3 . He presented on 04/21/22 with difficulty with speech and some confusion.   Imaging revealed acute ischemic L MCA infarct. On 9/18 rapid response was called and patient transferred to ICU and placed on BIPAP with improvement of respiratory status. CTA head and neck 50 % diameter stenosis proximal left internal carotid artery. Diminutive  right vertebral artery which appeared diffusely diseased and with minimal contribution to the basilar. 2 d echo with LVEF 30 to 35 %. Left ICA 40-59% stenosis. Recommend 30 day monitor to rule out afib as an outpatient. On Plavix pta. Now placed on ASA and Plavix.   Home meds of Coreg, Losartan and Lasix . Has declined AID in the past. TCAR 9/26 with Dr Scot Dock. Crestor resumed with LDL 69. Current smoker with counseling for cessation. Depression noted and started on Zoloft.     Complete NIHSS TOTAL: 4  Patient's medical record from [facility] has been reviewed by the rehabilitation admission coordinator and physician.  Past Medical History  Past Medical History:  Diagnosis Date   Carotid arterial disease ([place]) 06/2014; 01/09/2015   Followed by Dr. Donnetta Hutching of [facility] -- a) Carotid u/s: 40-59% bilat ICA stenosis; b) 10/2019: Carotid Dopplers October 20, 6566: R ICA 1-27%, LICA 51-70%.  Right vertebral artery appears occluded.  Normal subclavian flow.  Normal left vertebral artery. 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  MRI Brain: Acute L Occipital Lobe Infarct. Chronic microvascular ischemic changes in the white matter  and left pons.  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   Chronic combined systolic and diastolic congestive heart failure, NYHA class 2 ([place]) 05/11/2015 - 05/15/15   a. EF 20-25% with anterior-anteroseptal akinesis (immediately post anterior STEMI).  ; b. 10/10/'16 Echo: EF 35-40% with moderate mid-apical anteroseptal, anterior and apical hypokinesis.   Coronary artery disease involving native heart 05/11/2015   Cath: 3V CAD with 90% prox RCA, 80% mid RCA, 80% OM3, (RCA and OM residual treated medically) 100% LAD - PCI to Prox-Mid LAD  with Overlapping Promus Premier DES 3.0 x 38 & 3.0 x 16 (post-dilated to 3.5 mm)    Essential hypertension    H/O: GI bleed 05/15/2015   s/p Sigmoid Polypectomy 05/04/2015 - prior to STEMI on May 11, 2015; Had GI Bleed following TPA for CVA, while on ASA & Brilinta; Flex Sig - 1. Internal Hemorrhoids, 2. Distal sigmoid polypectomy site with flat [place] status post biopsy and tattoo with 15m spot over 3 injections 3. Proximal sigmoid polypectomy site seen as well, 4) otherwise normal with no evidence of bleed   Hyperlipidemia with target LDL less than 70    Ischemic cardiomyopathy 05/11/2015   EF 40% on cath 05/11/2015 after anterior STEMI, EF 20-25% on echo 05/13/2015; 12/2015 =  EF ~45%.   Liver disease, chronic, with cirrhosis ([place] 11/2019   Mild possible early cirrhotic liver disease noted on CT scan.   Prediabetes Oct 2016   A1C 6.0 in Oct 2016   ST elevation myocardial infarction (STEMI) involving left anterior descending (LAD) coronary artery with complication ([place] 178/01/7671  100% Prox LAD - PCI with overlapping DES x 2   Tobacco abuse    a. 30 yrs - 1.5 ppd. - Quit 05/11/2015   Type 2 diabetes mellitus with other specified complication ([place] 30/94/7096  Vision abnormalities    Right hemianopsia   Has the patient had major surgery during 100 days prior to admission? No  Family History   family history includes Cancer in his mother; Other in his mother and another family member. He was adopted.  Current Medications  Current Facility-Administered Medications:    0.9 %  sodium chloride infusion, 500 mL, Intravenous, Once PRN, DAngelia Mould MD   acetaminophen (TYLENOL) tablet 650 mg, 650 mg, Oral, Q4H PRN **OR** acetaminophen (TYLENOL) 160 MG/5ML solution 650 mg, 650 mg, Per Tube, Q4H PRN **OR** acetaminophen (TYLENOL) suppository 650 mg, 650 mg, Rectal, Q4H PRN, Baglia, Corrina, PA-C   acetaminophen (TYLENOL) tablet 325-650 mg, 325-650 mg, Oral, Q4H PRN **OR** acetaminophen  (TYLENOL) suppository 325-650 mg, 325-650 mg, Rectal, Q4H PRN, DAngelia Mould MD   alum & mag hydroxide-simeth (MAALOX/MYLANTA) 200-200-20 MG/5ML suspension 15-30 mL, 15-30 mL, Oral, Q2H PRN, DAngelia Mould MD   carvedilol (COREG) tablet 12.5 mg, 12.5 mg, Oral, BID WC, Baglia, Corrina, PA-C, 12.5 mg at 05/01/22 02836  Chlorhexidine Gluconate Cloth 2 % PADS 6 each, 6 each, Topical, Daily, Baglia, Corrina, PA-C, 6 each at 04/29/22 0935   clopidogrel (PLAVIX) tablet 75 mg, 75 mg, Oral, Daily, Baglia, Corrina, PA-C, 75 mg at 05/01/22 0903   dextrose 5 % and 0.45 % NaCl with KCl 20 mEq/L infusion, , Intravenous, Continuous, DAngelia Mould MD, Last Rate: 75 mL/hr at 05/01/22 0646, New Bag at 05/01/22 0646   docusate sodium (COLACE) capsule 100 mg, 100  mg, Oral, Daily, Angelia Mould, MD, 100 mg at 05/01/22 0903   famotidine (PEPCID) tablet 40 mg, 40 mg, Oral, QODAY, Baglia, Corrina, PA-C, 40 mg at 05/01/22 0903   ferrous sulfate tablet 325 mg, 325 mg, Oral, Q breakfast, Baglia, Corrina, PA-C, 325 mg at 05/01/22 0903   guaiFENesin-dextromethorphan (ROBITUSSIN DM) 100-10 MG/5ML syrup 15 mL, 15 mL, Oral, Q4H PRN, Angelia Mould, MD   heparin injection 5,000 Units, 5,000 Units, Subcutaneous, Q8H, Angelia Mould, MD, 5,000 Units at 05/01/22 1438   hydrALAZINE (APRESOLINE) injection 5 mg, 5 mg, Intravenous, Q20 Min PRN, Angelia Mould, MD   insulin aspart (novoLOG) injection 0-9 Units, 0-9 Units, Subcutaneous, TID WC, Baglia, Corrina, PA-C, 1 Units at 05/01/22 0634   labetalol (NORMODYNE) injection 10 mg, 10 mg, Intravenous, Q10 min PRN, Angelia Mould, MD   magnesium sulfate IVPB 2 g 50 mL, 2 g, Intravenous, Daily PRN, Angelia Mould, MD   metoprolol tartrate (LOPRESSOR) injection 2-5 mg, 2-5 mg, Intravenous, Q2H PRN, Angelia Mould, MD   ondansetron [facility]) injection 4 mg, 4 mg, Intravenous, Q6H PRN, Baglia, Corrina, PA-C, 4 mg  at 04/26/22 2229   ondansetron (ZOFRAN) injection 4 mg, 4 mg, Intravenous, Q6H PRN, Angelia Mould, MD   Oral care mouth rinse, 15 mL, Mouth Rinse, 4 times per day, Baglia, Corrina, PA-C, 15 mL at 04/30/22 2148   oxyCODONE-acetaminophen (PERCOCET/ROXICET) 5-325 MG per tablet 1-2 tablet, 1-2 tablet, Oral, Q4H PRN, Angelia Mould, MD   pantoprazole (PROTONIX) EC tablet 20 mg, 20 mg, Oral, QODAY, Baglia, Corrina, PA-C, 20 mg at 05/01/22 0903   phenol (CHLORASEPTIC) mouth spray 1 spray, 1 spray, Mouth/Throat, PRN, Angelia Mould, MD   potassium chloride SA (KLOR-CON M) CR tablet 20-40 mEq, 20-40 mEq, Oral, Daily PRN, Angelia Mould, MD   QUEtiapine (SEROQUEL) tablet 12.5 mg, 12.5 mg, Oral, Daily PRN, Baglia, Corrina, PA-C   rosuvastatin (CRESTOR) tablet 40 mg, 40 mg, Oral, q1800, Baglia, Corrina, PA-C, 40 mg at 04/30/22 1827   senna-docusate (Senokot-S) tablet 1 tablet, 1 tablet, Oral, QHS PRN, Baglia, Corrina, PA-C   [COMPLETED] sertraline (ZOLOFT) tablet 12.5 mg, 12.5 mg, Oral, Daily, 12.5 mg at 04/25/22 1639 **FOLLOWED BY** sertraline (ZOLOFT) tablet 12.5 mg, 12.5 mg, Oral, QHS, Baglia, Corrina, PA-C, 12.5 mg at 04/30/22 2148  Patients Current Diet:  Diet Order             Diet regular Room service appropriate? Yes; Fluid consistency: Thin  Diet effective now                  Precautions / Restrictions Precautions Precautions: Fall Precaution Comments: HOH - hearing aides in room Restrictions Weight Bearing Restrictions: No   Has the patient had 2 or more falls or a fall with injury in the past year? No  Prior Activity Level Limited Community (1-2x/wk): not acitve, drives, spends alot of time on computer games  Prior Functional Level Self Care: Did the patient need help bathing, dressing, using the toilet or eating? Independent  Indoor Mobility: Did the patient need assistance with walking from room to room (with or without device)?  Independent  Stairs: Did the patient need assistance with internal or external stairs (with or without device)? Independent  Functional Cognition: Did the patient need help planning regular tasks such as shopping or remembering to take medications? Independent  Patient Information Are you of Hispanic, Latino/a,or Spanish origin?: A. No, not of Hispanic, Latino/a, or Spanish origin What is  your race?: A. White Do you need or want an interpreter to communicate with a doctor or health care staff?: 9. Unable to respond  Patient's Response To:  Health Literacy and Transportation Is the patient able to respond to health literacy and transportation needs?: No Health Literacy - How often do you need to have someone help you when you read instructions, pamphlets, or other written material from your doctor or pharmacy?: Patient unable to respond In the past 12 months, has lack of transportation kept you from medical appointments or from getting medications?: No In the past 12 months, has lack of transportation kept you from meetings, work, or from getting things needed for daily living?: No  Home Assistive Devices / Equipment Home Equipment: Grab bars - tub/shower  Prior Device Use: Indicate devices/aids used by the patient prior to current illness, exacerbation or injury? None of the above  Current Functional Level Cognition  Arousal/Alertness: Lethargic Overall Cognitive Status: Impaired/Different from baseline Difficult to assess due to: Impaired communication Current Attention Level: Sustained Orientation Level: Oriented to person Following Commands: Follows one step commands consistently, Follows multi-step commands inconsistently (Could follow 2 step commands at times; some limitations due to hearing) Safety/Judgement: Decreased awareness of safety, Decreased awareness of deficits General Comments: Global aphasia. Following commands with visual demonstration due to pt being very hard of  hearing and with some receptive difficulties. Pt requiring mod multimodal cues throughout session for attention to R side of environment Decreaed safety awareness. Attention: Focused, Sustained Focused Attention: Impaired Focused Attention Impairment: Verbal basic Sustained Attention: Impaired Sustained Attention Impairment: Verbal basic, Functional basic Awareness: Impaired Awareness Impairment: Intellectual impairment, Emergent impairment, Anticipatory impairment Problem Solving: Impaired Problem Solving Impairment: Functional basic, Verbal basic Behaviors: Impulsive Safety/Judgment: Impaired    Extremity Assessment (includes Sensation/Coordination)  Upper Extremity Assessment: Generalized weakness RUE Deficits / Details: Able to perform toileting and LB ADLs in standing without cueing to integrate BUE RUE Coordination: decreased fine motor, decreased gross motor LUE Deficits / Details: does not move to command, PROM is WFL, some grimacing with overhead movement. Pt able to move arm against gravity. Difficult to assess due to LOA LUE Coordination: decreased fine motor, decreased gross motor  Lower Extremity Assessment: Defer to PT evaluation    ADLs  Overall ADL's : Needs assistance/impaired Eating/Feeding: NPO Grooming: Wash/dry hands, Wash/dry face, Min guard, Standing Upper Body Bathing: Supervision/ safety Lower Body Bathing: Minimal assistance, Cueing for safety, Sit to/from stand Upper Body Dressing : Moderate assistance Upper Body Dressing Details (indicate cue type and reason): Poor motor planning UB dressing Lower Body Dressing: Min guard, Minimal assistance, Sit to/from stand Lower Body Dressing Details (indicate cue type and reason): donning LB dressing during session with min guard-min A for steadying Functional mobility during ADLs: Minimal assistance General ADL Comments: Min A overall with poor attention impacting safety.    Mobility  Overal bed mobility: Needs  Assistance Bed Mobility: Supine to Sit Sidelying to sit: Min assist Supine to sit: Supervision Sit to supine: Supervision General bed mobility comments: Supervision but increased time and mod cues    Transfers  Overall transfer level: Needs assistance Equipment used: None Transfers: Sit to/from Stand Sit to Stand: Min guard Bed to/from chair/wheelchair/BSC transfer type:: Stand pivot Stand pivot transfers: Min assist General transfer comment: Light assist for balance recovery. Pt with good power up to full stand.    Ambulation / Gait / Stairs / Wheelchair Mobility  Ambulation/Gait Ambulation/Gait assistance: Herbalist (Feet): [place]  device: None Gait Pattern/deviations: Step-through pattern, Decreased stride length, Antalgic, Wide base of support General Gait Details: Min A at times to steady and mod cues for attending to R side.  Worked on finding objects on both sides -required increased time but was able to find and say room numbers, read caution sign, identify photo of trees on R and L.  RN spoke to pt on R side and he had hard time identifying but on way back to room she spoke to him from L and pt more attentive.  Pt not sure how to get back to his room after making a couple turns but once going in right direction pt looking in rooms on R and L until he saw his family. Gait velocity: Decreased Gait velocity interpretation: 1.31 - 2.62 ft/sec, indicative of limited community ambulator    Posture / Balance Dynamic Sitting Balance Sitting balance - Comments: Min guard for LB dressing sitting on edge of recliner Balance Overall balance assessment: Needs assistance Sitting-balance support: No upper extremity supported, Feet supported Sitting balance-Leahy Scale: Good Sitting balance - Comments: Min guard for LB dressing sitting on edge of recliner Postural control: Posterior lean Standing balance support: No upper extremity supported Standing  balance-Leahy Scale: Fair Standing balance comment: Ambulated with occasional min A but no RW High Level Balance Comments: Worked on scanning environment with ambulation.  Once in room reaching task in all direction (increased time to find objects on R), turned in circle, picked item for floor all with min guard.  No difficulty standing feet together EO and ECl    Special needs/care consideration    Previous Home Environment  Living Arrangements: Spouse/significant other  Lives With: Spouse Available Help at Discharge: Family, Available 24 hours/day (wife can take FMLA as needed or retire) Type of Home: Apartment Home Layout: One level Home Access: Level entry Bathroom Shower/Tub: Chiropodist: Standard Bathroom Accessibility: Yes How Accessible: Accessible via walker Home Care Services: No  Discharge Living Setting Plans for Discharge Living Setting: Lives with (comment), Apartment Type of Home at Discharge: Apartment Discharge Home Layout: One level Discharge Home Access: Level entry Discharge Bathroom Shower/Tub: Tub/shower unit Discharge Bathroom Toilet: Standard Discharge Bathroom Accessibility: Yes How Accessible: Accessible via walker Does the patient have any problems obtaining your medications?: No  Social/Family/Support Systems Patient Roles: Spouse Contact Information: wife, Mateo Flow Anticipated Caregiver: wife Anticipated Ambulance person Information: see contacts Ability/Limitations of Caregiver: wife works but can take [employer] or reitre as needed Caregiver Availability: 24/7 Discharge Plan Discussed with Primary Caregiver: Yes Is Caregiver In Agreement with Plan?: Yes Does Caregiver/Family have Issues with Lodging/Transportation while Pt is in Rehab?: No  Goals Patient/Family Goal for Rehab: Mod I to supervision with PT, OT and SLP Expected length of stay: ELOS 7 to 10 days Pt/Family Agrees to Admission and willing to participate:  Yes Program Orientation Provided & Reviewed with Pt/Caregiver Including Roles  & Responsibilities: Yes  Decrease burden of Care through IP rehab admission: n/a  Possible need for SNF placement upon discharge: not anticipated  Patient Condition: I have reviewed medical records from [facility], spoken with CM, and patient and spouse. I met with patient at the bedside for inpatient rehabilitation assessment.  Patient will benefit from ongoing PT, OT, and SLP, can actively participate in 3 hours of therapy a day 5 days of the week, and can make measurable gains during the admission.  Patient will also benefit from the coordinated team approach during an Inpatient Acute  Rehabilitation admission.  The patient will receive intensive therapy as well as Rehabilitation physician, nursing, social worker, and care management interventions.  Due to bladder management, bowel management, safety, skin/wound care, disease management, medication administration, pain management, and patient education the patient requires 24 hour a day rehabilitation nursing.  The patient is currently min assist overall with mobility and basic ADLs.  Discharge setting and therapy post discharge at home with home health is anticipated.  Patient has agreed to participate in the Acute Inpatient Rehabilitation Program and will admit today.  Preadmission Screen Completed By:  Cleatrice Burke, 05/01/2022 2:57 PM ______________________________________________________________________   Discussed status with Dr. Letta Pate on 05/01/22 at 1458 and received approval for admission today.  Admission Coordinator:  Cleatrice Burke, RN, time 4034 Date 05/01/22   Assessment/Plan: Diagnosis:  Left Parietal infarct Does the need for close, 24 hr/day Medical supervision in concert with the patient's rehab needs make it unreasonable for this patient to be served in a less intensive setting? Yes Co-Morbidities requiring  supervision/potential complications: CKD3, DM Due to bladder management, bowel management, safety, skin/wound care, disease management, medication administration, pain management, and patient education, does the patient require 24 hr/day rehab nursing? Yes Does the patient require coordinated care of a physician, rehab nurse, PT, OT, and SLP to address physical and functional deficits in the context of the above medical diagnosis(es)? Yes Addressing deficits in the following areas: balance, endurance, locomotion, strength, transferring, bowel/bladder control, bathing, dressing, toileting, cognition, speech, language, swallowing, and psychosocial support Can the patient actively participate in an intensive therapy program of at least 3 hrs of therapy 5 days a week? Yes The potential for patient to make measurable gains while on inpatient rehab is good Anticipated functional outcomes upon discharge from inpatient rehab: supervision PT, supervision OT, supervision SLP Estimated rehab length of stay to reach the above functional goals is: 7-10d Anticipated discharge destination: Home 10. Overall Rehab/Functional Prognosis: good   MD Signature: Charlett Blake M.D. [facility] Fellow Am Acad of Phys Med and Rehab Diplomate Am Board of Electrodiagnostic Med Fellow Am Board of Interventional Pain

## 2022-04-29 NOTE — Progress Notes (Signed)
  Inpatient Rehabilitation Admissions Coordinator   Met with patient and spouse at bedside for rehab assessment. We discussed goals and expectations of a possible CIR admit. She prefers CIR for rehab and was admitted 8 to 10 years ago to CIR after a CVA.. Family can provide expected caregiver support that is recommended of superversion level. I will begin insurance Auth with [facility] Medicare once I have re received updated therapy notes from today. for possible CIR admit pending approval after TCAR. Please call me with any questions.   Danne Baxter, RN, MSN Rehab Admissions Coordinator [phone]

## 2022-04-29 NOTE — Progress Notes (Signed)
PROGRESS NOTE    Damon Walker  OFB:[id number] DOB: [birth year] DOA: 04/21/2022 PCP: [name], MD    Brief Narrative:  Damon Walker is a 76 y.o. male with past medical history significant of carotid artery disease, hx of CVA, combined systolic and diastolic CHF, history of carotid artery stenosis hypertension, hyperlipidemia, type 2 diabetes, CKD stage III,presented to the hospital with difficulty speech, some confusion and not acting himself.    Code stroke was activated.  In the ED, blood pressure was slightly elevated.  Patient had global aphasia.  He was out of the window for TNK.  Creatinine was 1.3.  CT head scan did not show any acute findings.  CTA head and neck showed perfusion abnormality in the right temporal and left parietal lobe.  No large vessel occlusion was noted but carotid stenosis noted.  MRI of the brain showed left parietal lobe infarct without hemorrhage.  Patient was then admitted hospital for further evaluation and treatment.  On the night of admission patient was confused and was noted to be hypoxic and diaphoretic so was transferred to the ICU.  ABG showed a pH of 7.1.  Patient was put on nonrebreather mask initially.  He was put on BiPAP subsequently.  BiPAP has been weaned off and patient has been transferred back to medical service at this time.  At this time, patient's mentation has improved.  Vascular surgery planning for TCAR on Tuesday.     Assessment and Plan: Principal Problem:   Acute CVA (cerebrovascular accident) ([place]) Active Problems:   CAD-native artery   Essential hypertension   Hyperlipidemia with target LDL less than 70   Carotid arterial disease ([place])   CKD (chronic kidney disease), stage III ([place])   Type 2 diabetes mellitus with other specified complication (HCC)   Combined systolic and diastolic heart failure (HCC)   Acute respiratory failure with hypoxia and hypercapnia (HCC)   Hypokalemia   * Acute CVA (cerebrovascular accident)  ([place]) MRI brain showed left parietal lobe infarct without hemorrhage.  Neurology followed the patient during hospitalization.  CTA neck showed significant carotid stenosis and significant plaque in the left internal carotid artery.  Carotid duplex was also performed.  Vascular surgery on board and plan for TCAR on 04/30/2022.  continue aspirin,Plavix and Crestor 40 mg daily.  Speech therapy has recommended regular consistency diet.  Confusion and disorientation possible delirium.    Seroquel has been decreased to 12.5 mg as needed.  Has not required , mentation has improved at this time.  Is more coherent and communicative.  Carotid arterial disease ([place]) Patient had carotid ultrasound on 10/2021 showed: right ICA 1-39% stenosis, left ICA: 40-59% stenosis.  CTA at this time showed extensive atherosclerotic disease in the carotid bilaterally and 50% stenosis in the proximal left internal carotid artery.    Continue aspirin and Plavix.  As for vascular surgery, patient is not a candidate for surgery but potential for TCAR.  Continue Coreg.  History of coronary artery disease-STEMI post drug-eluting stent in the past. Continue statins and Coreg.  Combined systolic and diastolic heart failure (HCC) Currently euvolemic.  On Lasix at home as needed.  Review of 2D echocardiogram from 04/22/2022 shows LV ejection fraction of 30 to 35% with global hypokinesis and grade 1 diastolic dysfunction and akinesis of the left ventricular apical segment of anterior wall.  Continue Coreg  Type 2 diabetes mellitus with other specified complication ([place]) Controlled.  Hemoglobin A1c from 04/22/2022 at 5.8.  CKD (chronic kidney disease), stage  III ([place]) Creatinine likely at baseline. Creatinine today at 1.1.  Hyperlipidemia  with target LDL less than 70. Continue crestor.  Lipid panel from 04/22/2022 shows LDL of 69.  Essential hypertension Was on permissive hypertension for the first 48 hours.  Has been resumed on  Coreg.  Latest blood pressure at 132/54  Mild hypokalemia.  Latest potassium of 4.0.  Improved.  Latest magnesium of 2.2.    DVT prophylaxis: enoxaparin (LOVENOX) injection 40 mg Start: 04/23/22 1500 SCDs Start: 04/23/22 1319 SCD's Start: 04/21/22 1606   Code Status:     Code Status: Full Code  Disposition:  CIR as per PT evaluation after surgical intervention on 04/30/2022.  Status is: Inpatient  Remains inpatient appropriate because: Significant carotid stenosis with stroke, plan for TCAR on 04/30/22   Family Communication:  I spoke with the patient's wife at bedside on 04/28/2022.  Consultants:  Neurology Vascular surgery  Procedures:  None  Antimicrobials:  None  Anti-infectives (From admission, onward)    Start     Dose/Rate Route Frequency Ordered Stop   04/30/22 0800  cefUROXime (ZINACEF) 1.5 g in sodium chloride 0.9 % 100 mL IVPB  Status:  Discontinued        1.5 g 200 mL/hr over 30 Minutes Intravenous On call to O.R. 04/29/22 2947 04/29/22 0954   04/30/22 0600  ceFAZolin (ANCEF) IVPB 2g/100 mL premix       Note to Pharmacy: Send with pt to OR   2 g 200 mL/hr over 30 Minutes Intravenous On call 04/29/22 0948 05/01/22 0600       Subjective: Today, patient was seen and examined at bedside.  Not agitated confused to communicate.  Denies pain nausea vomiting.    Objective: Vitals:   04/28/22 2028 04/28/22 2347 04/29/22 0358 04/29/22 0855  BP: 125/64 (!) 140/60 (!) 129/53 (!) 132/54  Pulse: (!) 55 (!) 55 (!) 56 (!) 53  Resp: '20 16 18 20  '$ Temp: 98.4 F (36.9 C) 97.7 F (36.5 C) 98.5 F (36.9 C) 98 F (36.7 C)  TempSrc: Oral Oral Oral Oral  SpO2: 97% 93% 94% 95%  Weight:      Height:        Intake/Output Summary (Last 24 hours) at 04/29/2022 1122 Last data filed at 04/29/2022 0400 Gross per 24 hour  Intake 135 ml  Output 200 ml  Net -65 ml    Filed Weights   04/22/22 0100 04/27/22 0421  Weight: 87.5 kg 82.5 kg    Physical Examination: Body  mass index is 27.65 kg/m.   General:  Average built, not in obvious distress hard of hearing, follows commands, Communicative HENT:   No scleral pallor or icterus noted. Oral mucosa is moist.  Chest:  Clear breath sounds.  Diminished breath sounds bilaterally. No crackles or wheezes.  CVS: S1 &S2 heard. No murmur.  Regular rate and rhythm. Abdomen: Soft, nontender, nondistended.  Bowel sounds are heard.   Extremities: No cyanosis, clubbing or edema.  Peripheral pulses are palpable. Psych: Alert, awake and oriented to place, Communicative, normal mood. CNS: Hard of hearing, Communicative, answering appropriately,.  Power equal in all extremities.   Skin: Warm and dry.  No rashes noted.   Data Reviewed:   CBC: Recent Labs  Lab 04/23/22 0201 04/24/22 0244 04/25/22 0336 04/27/22 0854 04/29/22 0850  WBC 9.2 10.1 8.9 9.6 8.5  HGB 12.9* 14.3 13.7 13.3 13.7  HCT 39.4 43.0 40.5 40.1 41.8  MCV 92.1 90.5 90.2 92.0 92.1  PLT 190  223 216 204 215     Basic Metabolic Panel: Recent Labs  Lab 04/24/22 0244 04/25/22 0336 04/26/22 0831 04/27/22 0854 04/29/22 0850  NA 141 140 141 139 141  K 3.5 3.3* 4.0 3.6 4.0  CL 102 102 107 107 106  CO2 '26 27 27 25 26  '$ GLUCOSE 101* 96 97 117* 98  BUN 22 24* '23 19 18  '$ CREATININE 1.28* 1.24 1.28* 1.17 1.14  CALCIUM 9.1 9.1 9.2 8.7* 9.2  MG  --  2.2  --   --  2.2     Liver Function Tests: No results for input(s): "AST", "ALT", "ALKPHOS", "BILITOT", "PROT", "ALBUMIN" in the last 168 hours.    Radiology Studies: No results found.    LOS: 7 days    Flora Lipps, MD [facility] Available via Epic secure chat 7am-7pm After these hours, please refer to coverage provider listed on [email or web address] 04/29/2022, 11:22 AM

## 2022-04-29 NOTE — Progress Notes (Signed)
Occupational Therapy Treatment Patient Details Name: Damon Walker MRN: [id number] DOB: 01/06/1946 Today's Date: 04/29/2022   History of present illness The pt is a 76 yo male presenting 9/17 with AMS. 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 During night of 9/17 pt found in another room's bathroom, hypoxic, and diaphoretic. Placed on Bipap due to continued respiratory distress. PMH includes: CAD, CHF, HLD, CVA of L PCA in 2016, STEMI with DES x2, and DM II.   OT comments  Pt progressing towards established OT goals. Pt performing LB ADL with min guard -min A due to decreased balance. Pt performing grooming with min guard and intermittent min A. Functional mobility with min A. Pt with significantly decreased attention to R side, requiring tactile cues to bring attention to R. Maintaining gaze on therapist ~5 seconds before reverting attention back to L, and again requiring mod-max cues for attention to R side. Can visually track toward R, but max multimodal cues to do so. Cues to locate items on R as well. Presenting with decreased balance, safety, attention, cognition, and language. Due to pt support and significant change in functional status, continue to highly recommend AIR to optimize safety and independence in ADL and IADL.    Recommendations for follow up therapy are one component of a multi-disciplinary discharge planning process, led by the attending physician.  Recommendations may be updated based on patient status, additional functional criteria and insurance authorization.    Follow Up Recommendations  Acute inpatient rehab (3hours/day)    Assistance Recommended at Discharge Frequent or constant Supervision/Assistance  Patient can return home with the following  A little help with walking and/or transfers;A little help with bathing/dressing/bathroom;Direct supervision/assist for medications management;Direct supervision/assist for financial  management   Equipment Recommendations  None recommended by OT    Recommendations for Other Services Rehab consult    Precautions / Restrictions Precautions Precautions: Fall Precaution Comments: HOH - hearing aides in room Restrictions Weight Bearing Restrictions: No       Mobility Bed Mobility               General bed mobility comments: in recliner on arrival and departure    Transfers Overall transfer level: Needs assistance Equipment used: 1 person hand held assist Transfers: Sit to/from Stand Sit to Stand: Min assist           General transfer comment: Light assist for balance recovery. Pt with good power up to full stand.     Balance Overall balance assessment: Needs assistance Sitting-balance support: No upper extremity supported, Feet supported Sitting balance-Leahy Scale: Good Sitting balance - Comments: Min guard for LB dressing sitting on edge of recliner Postural control: Posterior lean Standing balance support: Single extremity supported, No upper extremity supported, During functional activity Standing balance-Leahy Scale: Fair                             ADL either performed or assessed with clinical judgement   ADL Overall ADL's : Needs assistance/impaired     Grooming: Wash/dry hands;Wash/dry face;Min guard;Standing               Lower Body Dressing: Min guard;Minimal assistance;Sit to/from stand Lower Body Dressing Details (indicate cue type and reason): donning LB dressing during session with min guard-min A for steadying             Functional mobility during ADLs: Minimal assistance General ADL Comments: Min A overall  with poor attention impacting safety.    Extremity/Trunk Assessment Upper Extremity Assessment Upper Extremity Assessment: Generalized weakness   Lower Extremity Assessment Lower Extremity Assessment: Defer to PT evaluation        Vision   Vision Assessment?: Vision impaired- to be  further tested in functional context Additional Comments: Pt with no overshooting or undershooting. Seems to deny double vision, bt some receptive difficulties, so unsure. Pt able to visually track to both sides, but difficulty following commands to avoid use of compensatory techniques.   Perception Perception Perception: Impaired   Praxis Praxis Praxis: Impaired Praxis Impairment Details: Ideation;Motor planning;Initiation;Ideomotor    Cognition Arousal/Alertness: Awake/alert Behavior During Therapy: Flat affect Overall Cognitive Status: Impaired/Different from baseline Area of Impairment: Orientation, Attention, Memory, Following commands, Safety/judgement, Awareness, Problem solving                 Orientation Level: Disoriented to, Place, Time, Situation Current Attention Level: Sustained Memory: Decreased short-term memory Following Commands: Follows one step commands consistently, Follows multi-step commands consistently Safety/Judgement: Decreased awareness of safety Awareness: Intellectual Problem Solving: Slow processing, Decreased initiation, Difficulty sequencing, Requires verbal cues, Requires tactile cues General Comments: Global aphasia. Following commands with visual demonstration due to pt being very hard of hearing and with some receptive difficulties. Pt requiring max multimodal cues throughout session for attention to R side of environment despite placement of therapist and wife in relation to patient. Max difficulty with expressive language. Decreaed safety awareness.        Exercises Exercises: Other exercises Other Exercises Other Exercises: Visual tracking toward L and R. Pt R eye not converging during tracking, but able to track at a distance [facility]    Shoulder Instructions       General Comments VSS. Pt wife very interested in AIR due to significant change in functional status. Pt has great support system.    Pertinent Vitals/ Pain       Pain  Assessment Pain Assessment: Faces Faces Pain Scale: No hurt Pain Intervention(s): Monitored during session  Home Living                                          Prior Functioning/Environment              Frequency  Min 2X/week        Progress Toward Goals  OT Goals(current goals can now be found in the care plan section)  Progress towards OT goals: Progressing toward goals  Acute Rehab OT Goals Patient Stated Goal: none stated OT Goal Formulation: With family Time For Goal Achievement: 05/06/22 Potential to Achieve Goals: Good  Plan Discharge plan remains appropriate    Co-evaluation                 AM-PAC OT "6 Clicks" Daily Activity     Outcome Measure   Help from another person eating meals?: A Little Help from another person taking care of personal grooming?: A Little Help from another person toileting, which includes using toliet, bedpan, or urinal?: A Little Help from another person bathing (including washing, rinsing, drying)?: A Little Help from another person to put on and taking off regular upper body clothing?: A Little Help from another person to put on and taking off regular lower body clothing?: A Little 6 Click Score: 18    End of Session Equipment Utilized During Treatment: Gait belt  OT Visit Diagnosis: Unsteadiness on feet (R26.81);Other abnormalities of gait and mobility (R26.89);Muscle weakness (generalized) (M62.81)   Activity Tolerance Patient tolerated treatment well   Patient Left in chair;with call bell/phone within reach;with family/visitor present (wife aware to call RN before departing from room)   Nurse Communication Mobility status        Time: 7290-2111 OT Time Calculation (min): 22 min  Charges: OT General Charges $OT Visit: 1 Visit OT Treatments $Self Care/Home Management : 8-22 mins  Shanda Howells, OTR/L [facility] Acute Rehabilitation Office: [phone]   Lula Olszewski 04/29/2022,  11:58 AM

## 2022-04-30 ENCOUNTER — Other Ambulatory Visit: Payer: Self-pay

## 2022-04-30 ENCOUNTER — Inpatient Hospital Stay (HOSPITAL_COMMUNITY): Payer: Medicare Other

## 2022-04-30 ENCOUNTER — Encounter (HOSPITAL_COMMUNITY)
Admission: EM | Disposition: A | Discharge status: Rehabilitation Facility | Payer: Self-pay | Source: Home / Self Care | Attending: Internal Medicine

## 2022-04-30 ENCOUNTER — Inpatient Hospital Stay (HOSPITAL_COMMUNITY): Payer: Medicare Other | Admitting: Anesthesiology

## 2022-04-30 ENCOUNTER — Encounter (HOSPITAL_COMMUNITY): Payer: Self-pay | Admitting: Family Medicine

## 2022-04-30 ENCOUNTER — Telehealth: Payer: Self-pay | Admitting: Vascular Surgery

## 2022-04-30 DIAGNOSIS — I639 Cerebral infarction, unspecified: Secondary | ICD-10-CM | POA: Diagnosis not present

## 2022-04-30 DIAGNOSIS — Z87891 Personal history of nicotine dependence: Secondary | ICD-10-CM

## 2022-04-30 DIAGNOSIS — I110 Hypertensive heart disease with heart failure: Secondary | ICD-10-CM

## 2022-04-30 DIAGNOSIS — I5040 Unspecified combined systolic (congestive) and diastolic (congestive) heart failure: Secondary | ICD-10-CM | POA: Diagnosis not present

## 2022-04-30 DIAGNOSIS — I6522 Occlusion and stenosis of left carotid artery: Secondary | ICD-10-CM | POA: Diagnosis not present

## 2022-04-30 DIAGNOSIS — I509 Heart failure, unspecified: Secondary | ICD-10-CM

## 2022-04-30 DIAGNOSIS — I2510 Atherosclerotic heart disease of native coronary artery without angina pectoris: Secondary | ICD-10-CM | POA: Diagnosis not present

## 2022-04-30 DIAGNOSIS — I63231 Cerebral infarction due to unspecified occlusion or stenosis of right carotid arteries: Secondary | ICD-10-CM

## 2022-04-30 HISTORY — PX: TRANSCAROTID ARTERY REVASCULARIZATIONÂ: SHX6778

## 2022-04-30 HISTORY — PX: ULTRASOUND GUIDANCE FOR VASCULAR ACCESS: SHX6516

## 2022-04-30 LAB — POCT I-STAT 7, (LYTES, BLD GAS, ICA,H+H)
Acid-Base Excess: 0 mmol/L (ref 0.0–2.0)
Bicarbonate: 25.8 mmol/L (ref 20.0–28.0)
Calcium, Ion: 1.21 mmol/L (ref 1.15–1.40)
HCT: 34 % — ABNORMAL LOW (ref 39.0–52.0)
Hemoglobin: 11.6 g/dL — ABNORMAL LOW (ref 13.0–17.0)
O2 Saturation: 100 %
Patient temperature: 35
Potassium: 4 mmol/L (ref 3.5–5.1)
Sodium: 142 mmol/L (ref 135–145)
TCO2: 27 mmol/L (ref 22–32)
pCO2 arterial: 43.2 mmHg (ref 32–48)
pH, Arterial: 7.376 (ref 7.35–7.45)
pO2, Arterial: 192 mmHg — ABNORMAL HIGH (ref 83–108)

## 2022-04-30 LAB — GLUCOSE, CAPILLARY
Glucose-Capillary: 107 mg/dL — ABNORMAL HIGH (ref 70–99)
Glucose-Capillary: 112 mg/dL — ABNORMAL HIGH (ref 70–99)
Glucose-Capillary: 183 mg/dL — ABNORMAL HIGH (ref 70–99)

## 2022-04-30 LAB — POCT ACTIVATED CLOTTING TIME
Activated Clotting Time: 245 seconds
Activated Clotting Time: 251 seconds

## 2022-04-30 SURGERY — TRANSCAROTID ARTERY REVASCULARIZATION (TCAR)
Anesthesia: General | Site: Neck | Laterality: Right

## 2022-04-30 MED ORDER — HEPARIN 6000 UNIT IRRIGATION SOLUTION
Status: AC
  Filled 2022-04-30: qty 500

## 2022-04-30 MED ORDER — METOPROLOL TARTRATE 5 MG/5ML IV SOLN: 2.0000 mg | INTRAVENOUS | Status: DC | PRN

## 2022-04-30 MED ORDER — PROPOFOL 10 MG/ML IV BOLUS
INTRAVENOUS | Status: AC
  Filled 2022-04-30: qty 20

## 2022-04-30 MED ORDER — PROPOFOL 10 MG/ML IV BOLUS
INTRAVENOUS | Status: DC | PRN
  Administered 2022-04-30: 20 mg via INTRAVENOUS
  Administered 2022-04-30: 70 mg via INTRAVENOUS

## 2022-04-30 MED ORDER — KCL IN DEXTROSE-NACL 20-5-0.45 MEQ/L-%-% IV SOLN
INTRAVENOUS | Status: DC
  Filled 2022-04-30: qty 1000

## 2022-04-30 MED ORDER — IODIXANOL 320 MG/ML IV SOLN
INTRAVENOUS | Status: DC | PRN
  Administered 2022-04-30: 30 mL

## 2022-04-30 MED ORDER — LACTATED RINGERS IV SOLN: INTRAVENOUS | Status: DC

## 2022-04-30 MED ORDER — ROCURONIUM BROMIDE 10 MG/ML (PF) SYRINGE
PREFILLED_SYRINGE | INTRAVENOUS | Status: DC | PRN
  Administered 2022-04-30: 20 mg via INTRAVENOUS
  Administered 2022-04-30: 60 mg via INTRAVENOUS

## 2022-04-30 MED ORDER — LACTATED RINGERS IV SOLN: INTRAVENOUS | Status: DC | PRN

## 2022-04-30 MED ORDER — SODIUM CHLORIDE 0.9 % IV SOLN: 500.0000 mL | Freq: Once | INTRAVENOUS | Status: DC | PRN

## 2022-04-30 MED ORDER — FENTANYL CITRATE (PF) 250 MCG/5ML IJ SOLN
INTRAMUSCULAR | Status: AC
  Filled 2022-04-30: qty 5

## 2022-04-30 MED ORDER — AMISULPRIDE (ANTIEMETIC) 5 MG/2ML IV SOLN: 10.0000 mg | Freq: Once | INTRAVENOUS | Status: DC | PRN

## 2022-04-30 MED ORDER — LABETALOL HCL 5 MG/ML IV SOLN: 10.0000 mg | INTRAVENOUS | Status: DC | PRN

## 2022-04-30 MED ORDER — OXYCODONE-ACETAMINOPHEN 5-325 MG PO TABS: 1.0000 | ORAL_TABLET | ORAL | Status: DC | PRN

## 2022-04-30 MED ORDER — POTASSIUM CHLORIDE CRYS ER 20 MEQ PO TBCR: 20.0000 meq | EXTENDED_RELEASE_TABLET | Freq: Every day | ORAL | Status: DC | PRN

## 2022-04-30 MED ORDER — 0.9 % SODIUM CHLORIDE (POUR BTL) OPTIME
TOPICAL | Status: DC | PRN
  Administered 2022-04-30: 1000 mL

## 2022-04-30 MED ORDER — GUAIFENESIN-DM 100-10 MG/5ML PO SYRP: 15.0000 mL | ORAL_SOLUTION | ORAL | Status: DC | PRN

## 2022-04-30 MED ORDER — HYDRALAZINE HCL 20 MG/ML IJ SOLN: 5.0000 mg | INTRAMUSCULAR | Status: DC | PRN

## 2022-04-30 MED ORDER — PANTOPRAZOLE SODIUM 40 MG PO TBEC: 40.0000 mg | DELAYED_RELEASE_TABLET | Freq: Every day | ORAL | Status: DC

## 2022-04-30 MED ORDER — FENTANYL CITRATE (PF) 250 MCG/5ML IJ SOLN
INTRAMUSCULAR | Status: DC | PRN
  Administered 2022-04-30: 75 ug via INTRAVENOUS
  Administered 2022-04-30: 25 ug via INTRAVENOUS

## 2022-04-30 MED ORDER — LIDOCAINE-EPINEPHRINE (PF) 1 %-1:200000 IJ SOLN
INTRAMUSCULAR | Status: AC
  Filled 2022-04-30: qty 30

## 2022-04-30 MED ORDER — ACETAMINOPHEN 325 MG PO TABS: 325.0000 mg | ORAL_TABLET | ORAL | Status: DC | PRN

## 2022-04-30 MED ORDER — HEMOSTATIC AGENTS (NO CHARGE) OPTIME
TOPICAL | Status: DC | PRN
  Administered 2022-04-30: 1 via TOPICAL

## 2022-04-30 MED ORDER — HEPARIN SODIUM (PORCINE) 5000 UNIT/ML IJ SOLN
5000.0000 [IU] | Freq: Three times a day (TID) | INTRAMUSCULAR | Status: DC
  Administered 2022-04-30 – 2022-05-01 (×3): 5000 [IU] via SUBCUTANEOUS
  Filled 2022-04-30 (×3): qty 1

## 2022-04-30 MED ORDER — SUGAMMADEX SODIUM 200 MG/2ML IV SOLN
INTRAVENOUS | Status: DC | PRN
  Administered 2022-04-30: 200 mg via INTRAVENOUS

## 2022-04-30 MED ORDER — PHENOL 1.4 % MT LIQD: 1.0000 | OROMUCOSAL | Status: DC | PRN

## 2022-04-30 MED ORDER — ACETAMINOPHEN 160 MG/5ML PO SOLN: 325.0000 mg | Freq: Once | ORAL | Status: DC | PRN

## 2022-04-30 MED ORDER — ALUM & MAG HYDROXIDE-SIMETH 200-200-20 MG/5ML PO SUSP: 15.0000 mL | ORAL | Status: DC | PRN

## 2022-04-30 MED ORDER — MAGNESIUM SULFATE 2 GM/50ML IV SOLN: 2.0000 g | Freq: Every day | INTRAVENOUS | Status: DC | PRN

## 2022-04-30 MED ORDER — ACETAMINOPHEN 325 MG PO TABS: 325.0000 mg | ORAL_TABLET | Freq: Once | ORAL | Status: DC | PRN

## 2022-04-30 MED ORDER — LIDOCAINE-EPINEPHRINE 1 %-1:100000 IJ SOLN
INTRAMUSCULAR | Status: DC | PRN
  Administered 2022-04-30: 10 mL

## 2022-04-30 MED ORDER — FENTANYL CITRATE (PF) 100 MCG/2ML IJ SOLN: 25.0000 ug | INTRAMUSCULAR | Status: DC | PRN

## 2022-04-30 MED ORDER — LIDOCAINE 2% (20 MG/ML) 5 ML SYRINGE
INTRAMUSCULAR | Status: DC | PRN
  Administered 2022-04-30: 40 mg via INTRAVENOUS

## 2022-04-30 MED ORDER — PHENYLEPHRINE HCL-NACL 20-0.9 MG/250ML-% IV SOLN
INTRAVENOUS | Status: DC | PRN
  Administered 2022-04-30: 20 ug/min via INTRAVENOUS

## 2022-04-30 MED ORDER — ACETAMINOPHEN 10 MG/ML IV SOLN: 1000.0000 mg | Freq: Once | INTRAVENOUS | Status: DC | PRN

## 2022-04-30 MED ORDER — GLYCOPYRROLATE 0.2 MG/ML IJ SOLN
INTRAMUSCULAR | Status: DC | PRN
  Administered 2022-04-30 (×2): .1 mg via INTRAVENOUS

## 2022-04-30 MED ORDER — HEPARIN 6000 UNIT IRRIGATION SOLUTION
Status: DC | PRN
  Administered 2022-04-30: 1

## 2022-04-30 MED ORDER — HEPARIN SODIUM (PORCINE) 1000 UNIT/ML IJ SOLN
INTRAMUSCULAR | Status: DC | PRN
  Administered 2022-04-30: 8000 [IU] via INTRAVENOUS
  Administered 2022-04-30: 1000 [IU] via INTRAVENOUS

## 2022-04-30 MED ORDER — ONDANSETRON HCL 4 MG/2ML IJ SOLN: 4.0000 mg | Freq: Four times a day (QID) | INTRAMUSCULAR | Status: DC | PRN

## 2022-04-30 MED ORDER — CEFAZOLIN SODIUM-DEXTROSE 2-4 GM/100ML-% IV SOLN
2.0000 g | Freq: Three times a day (TID) | INTRAVENOUS | Status: AC
  Administered 2022-04-30 – 2022-05-01 (×2): 2 g via INTRAVENOUS
  Filled 2022-04-30 (×2): qty 100

## 2022-04-30 MED ORDER — ACETAMINOPHEN 650 MG RE SUPP: 325.0000 mg | RECTAL | Status: DC | PRN

## 2022-04-30 MED ORDER — DOCUSATE SODIUM 100 MG PO CAPS
100.0000 mg | ORAL_CAPSULE | Freq: Every day | ORAL | Status: DC
  Administered 2022-05-01: 100 mg via ORAL
  Filled 2022-04-30: qty 1

## 2022-04-30 MED ORDER — PROTAMINE SULFATE 10 MG/ML IV SOLN
INTRAVENOUS | Status: DC | PRN
  Administered 2022-04-30: 40 mg via INTRAVENOUS

## 2022-04-30 MED ORDER — LIDOCAINE HCL (PF) 1 % IJ SOLN
INTRAMUSCULAR | Status: AC
  Filled 2022-04-30: qty 30

## 2022-04-30 MED ORDER — DEXAMETHASONE SODIUM PHOSPHATE 10 MG/ML IJ SOLN
INTRAMUSCULAR | Status: DC | PRN
  Administered 2022-04-30: 5 mg via INTRAVENOUS

## 2022-04-30 MED ORDER — ONDANSETRON HCL 4 MG/2ML IJ SOLN
INTRAMUSCULAR | Status: DC | PRN
  Administered 2022-04-30: 4 mg via INTRAVENOUS

## 2022-04-30 MED ORDER — PROMETHAZINE HCL 25 MG/ML IJ SOLN: 6.2500 mg | INTRAMUSCULAR | Status: DC | PRN

## 2022-04-30 MED ORDER — EPHEDRINE SULFATE-NACL 50-0.9 MG/10ML-% IV SOSY
PREFILLED_SYRINGE | INTRAVENOUS | Status: DC | PRN
  Administered 2022-04-30 (×4): 5 mg via INTRAVENOUS

## 2022-04-30 SURGICAL SUPPLY — 50 items
ADH SKN CLS APL DERMABOND .7 (GAUZE/BANDAGES/DRESSINGS) ×4
BAG COUNTER SPONGE SURGICOUNT (BAG) ×3 IMPLANT
BAG SPNG CNTER NS LX DISP (BAG) ×2
BALLN STERLING RX 6X30X80 (BALLOONS) ×2
BALLOON STERLING RX 6X30X80 (BALLOONS) IMPLANT
CANISTER SUCT 3000ML PPV (MISCELLANEOUS) ×3 IMPLANT
CANNULA VESSEL 3MM BLUNT TIP (CANNULA) IMPLANT
CLIP VESOCCLUDE MED 6/CT (CLIP) ×3 IMPLANT
CLIP VESOCCLUDE SM WIDE 6/CT (CLIP) ×3 IMPLANT
COVER PROBE W GEL 5X96 (DRAPES) ×3 IMPLANT
DERMABOND ADVANCED .7 DNX12 (GAUZE/BANDAGES/DRESSINGS) ×3 IMPLANT
DRAPE FEMORAL ANGIO 80X135IN (DRAPES) ×3 IMPLANT
ELECT REM PT RETURN 9FT ADLT (ELECTROSURGICAL) ×2
ELECTRODE REM PT RTRN 9FT ADLT (ELECTROSURGICAL) ×3 IMPLANT
GAUZE 4X4 16PLY ~~LOC~~+RFID DBL (SPONGE) IMPLANT
GLOVE BIOGEL PI IND STRL 8 (GLOVE) ×3 IMPLANT
GLOVE SURG POLY ORTHO LF SZ7.5 (GLOVE) IMPLANT
GLOVE SURG UNDER LTX SZ8 (GLOVE) ×3 IMPLANT
GOWN STRL REUS W/ TWL LRG LVL3 (GOWN DISPOSABLE) ×6 IMPLANT
GOWN STRL REUS W/ TWL XL LVL3 (GOWN DISPOSABLE) ×3 IMPLANT
GOWN STRL REUS W/TWL LRG LVL3 (GOWN DISPOSABLE) ×6
GOWN STRL REUS W/TWL XL LVL3 (GOWN DISPOSABLE) ×4
GUIDEWIRE ENROUTE 0.014 (WIRE) ×3 IMPLANT
INTRODUCER KIT GALT 7CM (INTRODUCER) ×4
KIT BASIN OR (CUSTOM PROCEDURE TRAY) ×3 IMPLANT
KIT ENCORE 26 ADVANTAGE (KITS) ×3 IMPLANT
KIT INTRODUCER GALT 7 (INTRODUCER) ×3 IMPLANT
KIT TURNOVER KIT B (KITS) ×3 IMPLANT
NDL HYPO 25GX1X1/2 BEV (NEEDLE) IMPLANT
NEEDLE HYPO 25GX1X1/2 BEV (NEEDLE) ×2 IMPLANT
PACK CAROTID (CUSTOM PROCEDURE TRAY) ×3 IMPLANT
PAD ARMBOARD 7.5X6 YLW CONV (MISCELLANEOUS) ×6 IMPLANT
POSITIONER HEAD DONUT 9IN (MISCELLANEOUS) ×3 IMPLANT
SET MICROPUNCTURE 5F STIFF (MISCELLANEOUS) ×3 IMPLANT
SPONGE T-LAP 18X18 ~~LOC~~+RFID (SPONGE) IMPLANT
STENT TRANSCAROTID SYSTEM 9X40 (Permanent Stent) IMPLANT
SUT MNCRL AB 4-0 PS2 18 (SUTURE) ×3 IMPLANT
SUT PROLENE 5 0 C 1 24 (SUTURE) ×3 IMPLANT
SUT SILK 2 0 SH (SUTURE) ×3 IMPLANT
SUT VIC AB 3-0 SH 27 (SUTURE) ×2
SUT VIC AB 3-0 SH 27X BRD (SUTURE) ×3 IMPLANT
SYR 10ML LL (SYRINGE) ×9 IMPLANT
SYR 20ML LL LF (SYRINGE) ×3 IMPLANT
SYR CONTROL 10ML LL (SYRINGE) IMPLANT
SYSTEM TRANSCAROTID NEUROPRTCT (MISCELLANEOUS) ×3 IMPLANT
TOWEL GREEN STERILE (TOWEL DISPOSABLE) ×3 IMPLANT
TRANSCAROTID NEUROPROTECT SYS (MISCELLANEOUS) ×2
TUBING CONNECTING 10 (TUBING) IMPLANT
WATER STERILE IRR 1000ML POUR (IV SOLUTION) ×3 IMPLANT
WIRE BENTSON .035X145CM (WIRE) ×3 IMPLANT

## 2022-04-30 NOTE — OR Nursing (Addendum)
Pt transported to surgery, Report called to [facility] Smith,CRNA.

## 2022-04-30 NOTE — Progress Notes (Signed)
PROGRESS NOTE    Damon Walker  PQZ:[id number] DOB: Sep 29, 1945 DOA: 04/21/2022 PCP: [name], MD    Brief Narrative:  Damon Walker is a 76 y.o. male with past medical history significant of carotid artery disease, hx of CVA, combined systolic and diastolic CHF, history of carotid artery stenosis hypertension, hyperlipidemia, type 2 diabetes, CKD stage III,presented to the hospital with difficulty speech, some confusion and not acting himself.    Code stroke was activated.  In the ED, blood pressure was slightly elevated.  Patient had global aphasia.  He was out of the window for TNK.  Creatinine was 1.3.  CT head scan did not show any acute findings.  CTA head and neck showed perfusion abnormality in the right temporal and left parietal lobe.  No large vessel occlusion was noted but carotid stenosis noted.  MRI of the brain showed left parietal lobe infarct without hemorrhage.  Patient was then admitted hospital for further evaluation and treatment.  On the night of admission patient was confused and was noted to be hypoxic and diaphoretic so was transferred to the ICU.  ABG showed a pH of 7.1.  Patient was put on nonrebreather mask initially.  He was put on BiPAP subsequently.  BiPAP has been weaned off and patient has been transferred back to medical service at this time.  At this time, patient has undergone TCAR and is on postoperative unit.  Plan is CIR.     Assessment and Plan: Principal Problem:   Acute CVA (cerebrovascular accident) ([place]) Active Problems:   CAD-native artery   Essential hypertension   Hyperlipidemia with target LDL less than 70   Carotid arterial disease ([place])   CKD (chronic kidney disease), stage III ([place])   Type 2 diabetes mellitus with other specified complication (HCC)   Combined systolic and diastolic heart failure (HCC)   Acute respiratory failure with hypoxia and hypercapnia (HCC)   Hypokalemia   * Acute CVA (cerebrovascular accident) ([place]) MRI  brain showed left parietal lobe infarct without hemorrhage.  Neurology followed the patient during hospitalization.  CTA neck showed significant carotid stenosis and significant plaque in the left internal carotid artery.  Carotid duplex was also performed.  Patient underwent TCAR on 04/30/2022.  Seen at the PACU unit.  Continue aspirin,Plavix and Crestor 40 mg daily.  Speech therapy has recommended regular consistency diet.  PT has recommended CIR at this time.  Confusion and disorientation possible delirium.    Seroquel has been decreased to 12.5 mg as needed.  Has not required , mentation has improved at this time.  Is more coherent and communicative.  Carotid arterial disease ([place]) Patient had carotid ultrasound on 10/2021 showed: right ICA 1-39% stenosis, left ICA: 40-59% stenosis.  CTA at this time showed extensive atherosclerotic disease in the carotid bilaterally and 50% stenosis in the proximal left internal carotid artery.    Continue aspirin and Plavix.  Status post TCAR.  Continue Coreg.  History of coronary artery disease-STEMI post drug-eluting stent in the past. Continue statins and Coreg.  Combined systolic and diastolic heart failure (HCC) Currently euvolemic.  On Lasix at home as needed.  Review of 2D echocardiogram from 04/22/2022 shows LV ejection fraction of 30 to 35% with global hypokinesis and grade 1 diastolic dysfunction and akinesis of the left ventricular apical segment of anterior wall.  Continue Coreg  Type 2 diabetes mellitus with other specified complication ([place]) Controlled.  Hemoglobin A1c from 04/22/2022 at 5.8.  CKD (chronic kidney disease), stage III ([place])  Creatinine likely at baseline. Creatinine today at 1.1.  Hyperlipidemia  with target LDL less than 70. Continue crestor.  Lipid panel from 04/22/2022 shows LDL of 69.  Essential hypertension On Coreg.  Mild hypokalemia.  Improved after replacement.  Latest potassium of 4.0   DVT prophylaxis: enoxaparin  (LOVENOX) injection 40 mg Start: 04/23/22 1500 SCDs Start: 04/23/22 1319 SCD's Start: 04/21/22 1606   Code Status:     Code Status: Full Code  Disposition:  CIR as per PT evaluation.  Status is: Inpatient  Remains inpatient appropriate because: Significant carotid stenosis with stroke, status post TCAR on 04/30/22, CIR placement   Family Communication:  I spoke with the patient's wife at bedside on 04/29/2022.  Consultants:  Neurology Vascular surgery  Procedures:  Left TCAR 04/30/2022  Antimicrobials:  None  Anti-infectives (From admission, onward)    Start     Dose/Rate Route Frequency Ordered Stop   04/30/22 0800  cefUROXime (ZINACEF) 1.5 g in sodium chloride 0.9 % 100 mL IVPB  Status:  Discontinued        1.5 g 200 mL/hr over 30 Minutes Intravenous On call to O.R. 04/29/22 7106 04/29/22 0954   04/30/22 0600  ceFAZolin (ANCEF) IVPB 2g/100 mL premix       Note to Pharmacy: Send with pt to OR   2 g 200 mL/hr over 30 Minutes Intravenous On call 04/29/22 0948 04/30/22 0810       Subjective: Today, patient was seen and examined at PACU after TCAR.  Denies overt pain.  No shortness of breath cough.  Objective: Vitals:   04/30/22 1300 04/30/22 1330 04/30/22 1400 04/30/22 1500  BP: (!) 117/37  (!) 117/42 (!) 143/53  Pulse: (!) 53 (!) 51 (!) 51 (!) 51  Resp: '19 12 14 19  '$ Temp:      TempSrc:      SpO2: 93% 91% 94% 95%  Weight:      Height:        Intake/Output Summary (Last 24 hours) at 04/30/2022 1544 Last data filed at 04/30/2022 1430 Gross per 24 hour  Intake 1000 ml  Output 620 ml  Net 380 ml    Filed Weights   04/22/22 0100 04/27/22 0421  Weight: 87.5 kg 82.5 kg    Physical Examination: Body mass index is 27.65 kg/m.   General:  Average built, not in obvious distress, hard of hearing, seen at the PACU unit.  Communicative. HENT:   No scleral pallor or icterus noted. Oral mucosa is moist.  Left lower neck with fresh incision. Chest:  Clear breath  sounds.  Diminished breath sounds bilaterally. No crackles or wheezes.  CVS: S1 &S2 heard. No murmur.  Regular rate and rhythm. Abdomen: Soft, nontender, nondistended.  Bowel sounds are heard.   Extremities: No cyanosis, clubbing or edema.  Peripheral pulses are palpable. Psych: Alert, awake and oriented, normal mood CNS:  No cranial nerve deficits.  Moves all extremities, hard of hearing, Skin: Warm and dry.  No rashes noted.   Data Reviewed:   CBC: Recent Labs  Lab 04/24/22 0244 04/25/22 0336 04/27/22 0854 04/29/22 0850 04/30/22 0826  WBC 10.1 8.9 9.6 8.5  --   HGB 14.3 13.7 13.3 13.7 11.6*  HCT 43.0 40.5 40.1 41.8 34.0*  MCV 90.5 90.2 92.0 92.1  --   PLT 223 216 204 215  --      Basic Metabolic Panel: Recent Labs  Lab 04/24/22 0244 04/25/22 0336 04/26/22 0831 04/27/22 2694 04/29/22 0850 04/30/22 8546  NA 141 140 141 139 141 142  K 3.5 3.3* 4.0 3.6 4.0 4.0  CL 102 102 107 107 106  --   CO2 '26 27 27 25 26  '$ --   GLUCOSE 101* 96 97 117* 98  --   BUN 22 24* '23 19 18  '$ --   CREATININE 1.28* 1.24 1.28* 1.17 1.14  --   CALCIUM 9.1 9.1 9.2 8.7* 9.2  --   MG  --  2.2  --   --  2.2  --      Liver Function Tests: No results for input(s): "AST", "ALT", "ALKPHOS", "BILITOT", "PROT", "ALBUMIN" in the last 168 hours.    Radiology Studies: DG C-Arm 1-60 Min-No Report  Result Date: 04/30/2022 Fluoroscopy was utilized by the requesting physician.  No radiographic interpretation.      LOS: 8 days    Flora Lipps, MD [facility] Available via Epic secure chat 7am-7pm After these hours, please refer to coverage provider listed on [email or web address] 04/30/2022, 3:44 PM

## 2022-04-30 NOTE — Plan of Care (Signed)
  Problem: Education: Goal: Ability to describe self-care measures that may prevent or decrease complications (Diabetes Survival Skills Education) will improve Outcome: Progressing Goal: Individualized Educational Video(s) Outcome: Progressing   Problem: Coping: Goal: Ability to adjust to condition or change in health will improve Outcome: Progressing   Problem: Education: Goal: Knowledge of General Education information will improve Description: Including pain rating scale, medication(s)/side effects and non-pharmacologic comfort measures Outcome: Progressing   Problem: Clinical Measurements: Goal: Ability to maintain clinical measurements within normal limits will improve Outcome: Progressing Goal: Will remain free from infection Outcome: Progressing   Problem: Nutrition: Goal: Adequate nutrition will be maintained Outcome: Progressing   Problem: Coping: Goal: Level of anxiety will decrease Outcome: Progressing   Problem: Elimination: Goal: Will not experience complications related to bowel motility Outcome: Progressing   Problem: Pain Managment: Goal: General experience of comfort will improve Outcome: Progressing

## 2022-04-30 NOTE — Op Note (Signed)
NAME: Damon Walker    MRN: [id number] DOB: 10/27/1945    DATE OF OPERATION: 04/30/2022  PREOP DIAGNOSIS:    Symptomatic left carotid stenosis  POSTOP DIAGNOSIS:    Same  PROCEDURE:    Left TCAR (transcarotid artery revascularization)  SURGEON: Judeth Cornfield. Scot Dock, MD  ASSIST: Servando Snare, MD  ANESTHESIA: General  EBL: Minimal  INDICATIONS:    Damon Walker is a 76 y.o. male who presented with a left parietal stroke.  He had a 60% left carotid stenosis that was fairly high.  He also had multiple medical comorbidities.  He showed some significant clinical improvement and therefore I recommended left transcarotid artery revascularization in order to lower his risk of future stroke.  Of note he had no significant right carotid stenosis.  His primary deficit was expressive aphasia preoperatively.  FINDINGS:   Completion arteriogram shows no residual stenosis.  TECHNIQUE:   The patient was taken to the operating room and received a general anesthetic.  I looked at the left common carotid artery with ultrasound between the 2 heads of the sternocleidomastoid.  The left neck and both groins were prepped and draped in the usual sterile fashion.  Dr. Donzetta Matters placed the right femoral vein sheath.  A longitudinal incision was made at the base of the triangle formed by the sternal and clavicular heads of the sternocleidomastoid.  This was extended over the clavicle slightly.  The dissection was carried down between the 2 heads of the sternocleidomastoid and the common carotid artery was dissected free.  It was dissected free circumferentially proximally but only anteriorly further distally.  A box stitch was placed with a 5-0 Prolene.  The common carotid artery was cannulated with a micropuncture needle and the micropuncture sheath introduced over wire.  Arteriogram was obtained to demonstrate the position of the bifurcation.  I then advanced a Rosen wire to below the bifurcation.   There was some difficulty in passing the sheath and therefore ultimately this was removed and the suture was tied.  We then cannulated proximal again with a micropuncture needle and a micropuncture sheath was introduced over a wire.  Arteriogram showed no evidence of dissection and again the bifurcation was identified.  The Rosen wire was advanced a below the bifurcation.  The sheath was advanced over the wire and the wire and dilator removed.  This was then secured with two 2-0 silk ties.  The circuit was then connected and passive flow was established and tested.  Next the arteriogram was obtained which demonstrated the stenosis.  TCAR timeout was performed.  Blood pressure was in perfect range and ACT was therapeutic.  The patient had received glycopyrrolate.  The proximal common carotid artery was controlled with a vessel loop and a high flow reverse flow was established and tested.  I then advanced the 6 x 30 balloon which was mounted on the wire.  The wire was then advanced through the stenosis into the distal internal carotid artery.  Arteriogram was obtained to confirm this.  The balloon was then positioned across the stenosis and inflated to 6 atm.  Next the balloon was removed and the wire left in place.  A 9 x 40 stent was selected and this was positioned across the area of concern and deployed without difficulty.  Reverse flow was established for a full 2 minutes after that.  Fluoroscopy was used to look at the stent carefully and there was no evidence of compression of the stent.  Completion arteriogram showed  an excellent result with no residual stenosis.  The sheath was then removed and a 5-0 Prolene placed around the sheath.  There was good hemostasis.  The sheath in the groin was removed and pressure held for hemostasis.  Hemostasis was obtained in the neck and this was then closed with 2 deep layers of 3-0 Vicryl and the skin closed with 4-0 Monocryl.  Dermabond was applied.  The patient  tolerated the procedure well and awoke neurologically intact at his baseline.  All needle and sponge counts were correct.  The patient was transferred to the recovery room in stable condition.   The first assistant was necessary in order to allow stabilization of the wire and advancement of the sheath system in addition to stabilization for placement of the balloon and stent.  In addition the assistant placed the right femoral vein sheath.  Deitra Mayo, MD, FACS [facility]  DATE OF DICTATION:   04/30/2022

## 2022-04-30 NOTE — Anesthesia Procedure Notes (Signed)
Procedure Name: Intubation Date/Time: 04/30/2022 8:02 AM  Performed by: Thelma Comp, CRNAPre-anesthesia Checklist: Patient identified, Emergency Drugs available, Suction available and Patient being monitored Patient Re-evaluated:Patient Re-evaluated prior to induction Oxygen Delivery Method: Circle System Utilized Preoxygenation: Pre-oxygenation with 100% oxygen Induction Type: IV induction Ventilation: Mask ventilation without difficulty Laryngoscope Size: Mac and 4 Grade View: Grade I Tube type: Oral Number of attempts: 1 Airway Equipment and Method: Stylet Placement Confirmation: ETT inserted through vocal cords under direct vision, positive ETCO2 and breath sounds checked- equal and bilateral Secured at: 22 cm Tube secured with: Tape Dental Injury: Teeth and Oropharynx as per pre-operative assessment

## 2022-04-30 NOTE — Progress Notes (Signed)
VASCULAR SURGERY:  This is a 76 year old gentleman who presented with an acute left parietal stroke.  He had a 60% left carotid stenosis that extended fairly high.  He showed significant clinical improvement over the weekend and therefore I have recommended left transcarotid stenting in order to lower his risk of future stroke.  I have discussed the operation with the patient and his wife.  We have reviewed the indications for surgery and that is to lower his risk of future stroke.  We have also discussed the potential complications of surgery including, but not limited to, perioperative stroke (1 to 2% risk), MI, bleeding, or other unpredictable medical problems.  They are agreeable to proceed.  He is on aspirin, Plavix, and a statin.  His renal function is normal.  GFR is greater than 60.  His right carotid artery is widely patent.  [name], MD 7:26 AM

## 2022-04-30 NOTE — Telephone Encounter (Signed)
-----   Message from Angelia Mould, MD sent at 04/30/2022 10:05 AM EDT ----- Regarding: charge and f/u  PROCEDURE:   Left TCAR (transcarotid artery revascularization)  SURGEON: Judeth Cornfield. Scot Dock, MD  ASSIST: Servando Snare, MD  This patient will need to follow-up on my schedule in a month with a carotid duplex scan at that time.  Thank you. CD

## 2022-04-30 NOTE — Discharge Instructions (Signed)
   [facility]  Discharge Instructions   Carotid Surgery  Please refer to the following instructions for your post-procedure care. Your surgeon or physician assistant will discuss any changes with you.  Activity  You are encouraged to walk as much as you can. You can slowly return to normal activities but must avoid strenuous activity and heavy lifting until your doctor tell you it's okay. Avoid activities such as vacuuming or swinging a golf club. You can drive after one week if you are comfortable and you are no longer taking prescription pain medications. It is normal to feel tired for serval weeks after your surgery. It is also normal to have difficulty with sleep habits, eating, and bowel movements after surgery. These will go away with time.  Bathing/Showering  Shower daily after you go home. Do not soak in a bathtub, hot tub, or swim until the incision heals completely.  Incision Care  Shower every day. Clean your incision with mild soap and water. Pat the area dry with a clean towel. You do not need a bandage unless otherwise instructed. Do not apply any ointments or creams to your incision. You may have skin glue on your incision. Do not peel it off. It will come off on its own in about one week. Your incision may feel thickened and raised for several weeks after your surgery. This is normal and the skin will soften over time.   For Men Only: It's okay to shave around the incision but do not shave the incision itself for 2 weeks. It is common to have numbness under your chin that could last for several months.  Diet  Resume your normal diet. There are no special food restrictions following this procedure. A low fat/low cholesterol diet is recommended for all patients with vascular disease. In order to heal from your surgery, it is CRITICAL to get adequate nutrition. Your body requires vitamins, minerals, and protein. Vegetables are the best source of  vitamins and minerals. Vegetables also provide the perfect balance of protein. Processed food has little nutritional value, so try to avoid this.  Medications  Resume taking all of your medications unless your doctor or physician assistant tells you not to. If your incision is causing pain, you may take over-the- counter pain relievers such as acetaminophen (Tylenol). If you were prescribed a stronger pain medication, please be aware these medications can cause nausea and constipation. Prevent nausea by taking the medication with a snack or meal. Avoid constipation by drinking plenty of fluids and eating foods with a high amount of fiber, such as fruits, vegetables, and grains.   Do not take Tylenol if you are taking prescription pain medications.  Follow Up  Our office will schedule a follow up appointment 2-3 weeks following discharge.  Please call us immediately for any of the following conditions  . Increased pain, redness, drainage (pus) from your incision site. . Fever of 101 degrees or higher. . If you should develop stroke (slurred speech, difficulty swallowing, weakness on one side of your body, loss of vision) you should call 911 and go to the nearest emergency room. .  Reduce your risk of vascular disease:  . Stop smoking. If you would like help call [facility] at 1-800-QUIT-NOW (1-800-784-8669) or [place] at [phone]. . Manage your cholesterol . Maintain a desired weight . Control your diabetes . Keep your blood pressure down .  If you have any questions, please call the office at [phone]. 

## 2022-04-30 NOTE — Interval H&P Note (Signed)
History and Physical Interval Note:  04/30/2022 7:12 AM  Damon Walker  has presented today for surgery, with the diagnosis of Symptomatic left carotid artery stenosis.  The various methods of treatment have been discussed with the patient and family. After consideration of risks, benefits and other options for treatment, the patient has consented to  Procedure(s): Transcarotid Artery Revascularization (Left) as a surgical intervention.  The patient's history has been reviewed, patient examined, no change in status, stable for surgery.  I have reviewed the patient's chart and labs.  Questions were answered to the patient's satisfaction.     Deitra Mayo

## 2022-04-30 NOTE — Progress Notes (Addendum)
  Progress Note    04/30/2022 1:05 PM Day of Surgery  Subjective:  upset he cannot drink. Otherwise no other complaints   Vitals:   04/30/22 1115 04/30/22 1130  BP: (!) 113/58 (!) 128/55  Pulse:  (!) 53  Resp: 17 14  Temp:  (!) 97 F (36.1 C)  SpO2:  91%   Physical Exam: Cardiac:  regular Lungs:  non labored Incisions:  left neck incision is clean, dry and intact Extremities: moving all extremities without deficits Neurologic: alert and oriented   CBC    Component Value Date/Time   WBC 8.5 04/29/2022 0850   RBC 4.54 04/29/2022 0850   HGB 11.6 (L) 04/30/2022 0826   HCT 34.0 (L) 04/30/2022 0826   PLT 215 04/29/2022 0850   MCV 92.1 04/29/2022 0850   MCH 30.2 04/29/2022 0850   MCHC 32.8 04/29/2022 0850   RDW 12.4 04/29/2022 0850   LYMPHSABS 1.9 04/21/2022 1317   MONOABS 0.9 04/21/2022 1317   EOSABS 0.5 04/21/2022 1317   BASOSABS 0.1 04/21/2022 1317    BMET    Component Value Date/Time   NA 142 04/30/2022 0826   NA 146 (H) 11/24/2019 1830   K 4.0 04/30/2022 0826   CL 106 04/29/2022 0850   CO2 26 04/29/2022 0850   GLUCOSE 98 04/29/2022 0850   BUN 18 04/29/2022 0850   BUN 24 11/24/2019 1830   CREATININE 1.14 04/29/2022 0850   CREATININE 1.51 (H) 09/24/2016 1554   CALCIUM 9.2 04/29/2022 0850   GFRNONAA >60 04/29/2022 0850   GFRAA 69 11/24/2019 1830    INR    Component Value Date/Time   INR 1.1 04/21/2022 1317     Intake/Output Summary (Last 24 hours) at 04/30/2022 1305 Last data filed at 04/30/2022 0936 Gross per 24 hour  Intake 1000 ml  Output 470 ml  Net 530 ml     Assessment/Plan:  76 y.o. male is s/p Left TCAR  Day of Surgery   Doing well post operatively Left neck incision is clean, dry and intact Neurologically intact VSS Waiting on 4E bed  Marval Regal [facility] [phone] 04/30/2022 1:05 PM  I have interviewed the patient and examined the patient. I agree with the findings by the PA. Neuro at  baseline. Incision looks fine. BP stable.   [name], MD

## 2022-04-30 NOTE — Anesthesia Procedure Notes (Signed)
Arterial Line Insertion Start/End9/26/2023 7:20 AM, 04/30/2022 7:30 AM  Patient location: Pre-op. Preanesthetic checklist: patient identified, IV checked, site marked, risks and benefits discussed, surgical consent, monitors and equipment checked, pre-op evaluation, timeout performed and anesthesia consent Lidocaine 1% used for infiltration Left, radial was placed Catheter size: 20 G Hand hygiene performed  and maximum sterile barriers used   Attempts: 1 Procedure performed without using ultrasound guided technique. Following insertion, dressing applied and Biopatch. Post procedure assessment: normal and unchanged  Patient tolerated the procedure well with no immediate complications.

## 2022-04-30 NOTE — Transfer of Care (Signed)
Immediate Anesthesia Transfer of Care Note  Patient: Damon Walker  Procedure(s) Performed: Transcarotid Artery Revascularization (Left: Neck) ULTRASOUND GUIDANCE FOR VASCULAR ACCESS (Right: Groin)  Patient Location: PACU  Anesthesia Type:General  Level of Consciousness: drowsy  Airway & Oxygen Therapy: Patient Spontanous Breathing  Post-op Assessment: Report given to RN and Post -op Vital signs reviewed and stable  Post vital signs: Reviewed and stable  Last Vitals:  Vitals Value Taken Time  BP 93/52 04/30/22 0954  Temp    Pulse 75 04/30/22 0956  Resp 12 04/30/22 0957  SpO2 94 % 04/30/22 0956  Vitals shown include unvalidated device data. Art line 145/52  Last Pain:  Vitals:   04/30/22 0013  TempSrc: Axillary  PainSc:       Patients Stated Pain Goal: 0 (40/37/54 3606)  Complications: No notable events documented.

## 2022-04-30 NOTE — Anesthesia Preprocedure Evaluation (Addendum)
Anesthesia Evaluation  Patient identified by MRN, date of birth, ID band  Reviewed: Allergy & Precautions, NPO status , Patient's Chart, lab work & pertinent test results  Airway Mallampati: I  TM Distance: >3 FB Neck ROM: Full    Dental  (+) Edentulous Upper, Edentulous Lower   Pulmonary former smoker,     + decreased breath sounds      Cardiovascular hypertension, Pt. on home beta blockers and Pt. on medications + CAD, + Cardiac Stents and +CHF   Rhythm:Regular Rate:Normal  Echo: 1. Left ventricular ejection fraction, by estimation, is 30 to 35%. The  left ventricle has moderately decreased function. The left ventricle  demonstrates global hypokinesis. Left ventricular diastolic parameters are  consistent with Grade I diastolic  dysfunction (impaired relaxation). There is akinesis of the left  ventricular, apical apical segment and anterior wall.  2. Right ventricular systolic function is normal. The right ventricular  size is normal. There is normal pulmonary artery systolic pressure.  3. The mitral valve is normal in structure. Trivial mitral valve  regurgitation. No evidence of mitral stenosis.  4. The aortic valve is normal in structure. Aortic valve regurgitation is  not visualized. No aortic stenosis is present.  5. The inferior vena cava is normal in size with greater than 50%  respiratory variability, suggesting right atrial pressure of 3 mmHg.    Neuro/Psych PSYCHIATRIC DISORDERS Depression CVA    GI/Hepatic Neg liver ROS, GERD  Medicated,  Endo/Other  diabetes, Type 2  Renal/GU      Musculoskeletal   Abdominal Normal abdominal exam  (+)   Peds  Hematology   Anesthesia Other Findings   Reproductive/Obstetrics                            Anesthesia Physical Anesthesia Plan  ASA: 4  Anesthesia Plan: General   Post-op Pain Management:    Induction: Intravenous  PONV Risk  Score and Plan: 3 and Ondansetron, Dexamethasone and Treatment may vary due to age or medical condition  Airway Management Planned: Oral ETT  Additional Equipment: Arterial line  Intra-op Plan:   Post-operative Plan: Extubation in OR  Informed Consent: I have reviewed the patients History and Physical, chart, labs and discussed the procedure including the risks, benefits and alternatives for the proposed anesthesia with the patient or authorized representative who has indicated his/her understanding and acceptance.     Consent reviewed with POA  Plan Discussed with: CRNA  Anesthesia Plan Comments:        Anesthesia Quick Evaluation

## 2022-04-30 NOTE — Anesthesia Postprocedure Evaluation (Signed)
Anesthesia Post Note  Patient: Damon Walker  Procedure(s) Performed: Transcarotid Artery Revascularization (Left: Neck) ULTRASOUND GUIDANCE FOR VASCULAR ACCESS (Right: Groin)     Patient location during evaluation: PACU Anesthesia Type: General Level of consciousness: awake and alert Pain management: pain level controlled Vital Signs Assessment: post-procedure vital signs reviewed and stable Respiratory status: spontaneous breathing, nonlabored ventilation, respiratory function stable and patient connected to nasal cannula oxygen Cardiovascular status: blood pressure returned to baseline and stable Postop Assessment: no apparent nausea or vomiting Anesthetic complications: no   No notable events documented.  Last Vitals:  Vitals:   04/30/22 1400 04/30/22 1500  BP: (!) 117/42 (!) 143/53  Pulse: (!) 51 (!) 51  Resp: 14 19  Temp:    SpO2: 94% 95%    Last Pain:  Vitals:   04/30/22 1115  TempSrc:   PainSc: 0-No pain                 Effie Berkshire

## 2022-05-01 ENCOUNTER — Inpatient Hospital Stay (HOSPITAL_COMMUNITY)
Admission: RE | Admit: 2022-05-01 | Discharge: 2022-05-08 | DRG: 057 | Disposition: A | Discharge status: Home / Self Care | Payer: Medicare Other | Source: Intra-hospital | Attending: Physical Medicine & Rehabilitation | Admitting: Physical Medicine & Rehabilitation

## 2022-05-01 ENCOUNTER — Other Ambulatory Visit: Payer: Self-pay

## 2022-05-01 ENCOUNTER — Encounter (HOSPITAL_COMMUNITY): Payer: Self-pay | Admitting: Physical Medicine & Rehabilitation

## 2022-05-01 DIAGNOSIS — Z87891 Personal history of nicotine dependence: Secondary | ICD-10-CM

## 2022-05-01 DIAGNOSIS — Z741 Need for assistance with personal care: Secondary | ICD-10-CM | POA: Diagnosis present

## 2022-05-01 DIAGNOSIS — F32A Depression, unspecified: Secondary | ICD-10-CM | POA: Diagnosis present

## 2022-05-01 DIAGNOSIS — E785 Hyperlipidemia, unspecified: Secondary | ICD-10-CM | POA: Diagnosis not present

## 2022-05-01 DIAGNOSIS — D62 Acute posthemorrhagic anemia: Secondary | ICD-10-CM

## 2022-05-01 DIAGNOSIS — I69320 Aphasia following cerebral infarction: Secondary | ICD-10-CM

## 2022-05-01 DIAGNOSIS — I63512 Cerebral infarction due to unspecified occlusion or stenosis of left middle cerebral artery: Secondary | ICD-10-CM | POA: Diagnosis present

## 2022-05-01 DIAGNOSIS — I252 Old myocardial infarction: Secondary | ICD-10-CM | POA: Diagnosis not present

## 2022-05-01 DIAGNOSIS — E440 Moderate protein-calorie malnutrition: Secondary | ICD-10-CM | POA: Insufficient documentation

## 2022-05-01 DIAGNOSIS — I2510 Atherosclerotic heart disease of native coronary artery without angina pectoris: Secondary | ICD-10-CM | POA: Diagnosis not present

## 2022-05-01 DIAGNOSIS — R7303 Prediabetes: Secondary | ICD-10-CM | POA: Diagnosis not present

## 2022-05-01 DIAGNOSIS — I130 Hypertensive heart and chronic kidney disease with heart failure and stage 1 through stage 4 chronic kidney disease, or unspecified chronic kidney disease: Secondary | ICD-10-CM | POA: Diagnosis present

## 2022-05-01 DIAGNOSIS — I10 Essential (primary) hypertension: Secondary | ICD-10-CM | POA: Diagnosis present

## 2022-05-01 DIAGNOSIS — Z6828 Body mass index (BMI) 28.0-28.9, adult: Secondary | ICD-10-CM | POA: Diagnosis not present

## 2022-05-01 DIAGNOSIS — R410 Disorientation, unspecified: Secondary | ICD-10-CM | POA: Diagnosis present

## 2022-05-01 DIAGNOSIS — E1122 Type 2 diabetes mellitus with diabetic chronic kidney disease: Secondary | ICD-10-CM | POA: Diagnosis present

## 2022-05-01 DIAGNOSIS — L03113 Cellulitis of right upper limb: Secondary | ICD-10-CM | POA: Diagnosis not present

## 2022-05-01 DIAGNOSIS — Z79899 Other long term (current) drug therapy: Secondary | ICD-10-CM | POA: Diagnosis not present

## 2022-05-01 DIAGNOSIS — Z7902 Long term (current) use of antithrombotics/antiplatelets: Secondary | ICD-10-CM | POA: Diagnosis not present

## 2022-05-01 DIAGNOSIS — I5042 Chronic combined systolic (congestive) and diastolic (congestive) heart failure: Secondary | ICD-10-CM

## 2022-05-01 DIAGNOSIS — I6389 Other cerebral infarction: Secondary | ICD-10-CM

## 2022-05-01 DIAGNOSIS — I69351 Hemiplegia and hemiparesis following cerebral infarction affecting right dominant side: Secondary | ICD-10-CM | POA: Diagnosis not present

## 2022-05-01 DIAGNOSIS — K7460 Unspecified cirrhosis of liver: Secondary | ICD-10-CM | POA: Diagnosis not present

## 2022-05-01 DIAGNOSIS — H9190 Unspecified hearing loss, unspecified ear: Secondary | ICD-10-CM | POA: Diagnosis not present

## 2022-05-01 DIAGNOSIS — K219 Gastro-esophageal reflux disease without esophagitis: Secondary | ICD-10-CM | POA: Diagnosis not present

## 2022-05-01 DIAGNOSIS — L0390 Cellulitis, unspecified: Secondary | ICD-10-CM

## 2022-05-01 DIAGNOSIS — E876 Hypokalemia: Secondary | ICD-10-CM

## 2022-05-01 DIAGNOSIS — N1831 Chronic kidney disease, stage 3a: Secondary | ICD-10-CM

## 2022-05-01 DIAGNOSIS — R4701 Aphasia: Secondary | ICD-10-CM | POA: Diagnosis not present

## 2022-05-01 DIAGNOSIS — N183 Chronic kidney disease, stage 3 unspecified: Secondary | ICD-10-CM | POA: Diagnosis not present

## 2022-05-01 LAB — CBC
HCT: 34.3 % — ABNORMAL LOW (ref 39.0–52.0)
Hemoglobin: 11.8 g/dL — ABNORMAL LOW (ref 13.0–17.0)
MCH: 31 pg (ref 26.0–34.0)
MCHC: 34.4 g/dL (ref 30.0–36.0)
MCV: 90 fL (ref 80.0–100.0)
Platelets: 210 10*3/uL (ref 150–400)
RBC: 3.81 MIL/uL — ABNORMAL LOW (ref 4.22–5.81)
RDW: 12.3 % (ref 11.5–15.5)
WBC: 12.1 10*3/uL — ABNORMAL HIGH (ref 4.0–10.5)
nRBC: 0 % (ref 0.0–0.2)

## 2022-05-01 LAB — BASIC METABOLIC PANEL
Anion gap: 8 (ref 5–15)
BUN: 16 mg/dL (ref 8–23)
CO2: 24 mmol/L (ref 22–32)
Calcium: 8.4 mg/dL — ABNORMAL LOW (ref 8.9–10.3)
Chloride: 108 mmol/L (ref 98–111)
Creatinine, Ser: 1.08 mg/dL (ref 0.61–1.24)
GFR, Estimated: >60 mL/min (ref >60)
Glucose, Bld: 129 mg/dL — ABNORMAL HIGH (ref 70–99)
Potassium: 3.7 mmol/L (ref 3.5–5.1)
Sodium: 140 mmol/L (ref 135–145)

## 2022-05-01 LAB — LIPID PANEL
Cholesterol: 86 mg/dL (ref 0–200)
HDL: 22 mg/dL — ABNORMAL LOW (ref >40)
LDL Cholesterol: 43 mg/dL (ref 0–99)
Total CHOL/HDL Ratio: 3.9 RATIO
Triglycerides: 104 mg/dL (ref <150)
VLDL: 21 mg/dL (ref 0–40)

## 2022-05-01 LAB — GLUCOSE, CAPILLARY
Glucose-Capillary: 127 mg/dL — ABNORMAL HIGH (ref 70–99)
Glucose-Capillary: 120 mg/dL — ABNORMAL HIGH (ref 70–99)
Glucose-Capillary: 133 mg/dL — ABNORMAL HIGH (ref 70–99)

## 2022-05-01 LAB — MAGNESIUM: Magnesium: 2 mg/dL (ref 1.7–2.4)

## 2022-05-01 MED ORDER — FERROUS SULFATE 325 (65 FE) MG PO TABS
325.0000 mg | ORAL_TABLET | Freq: Every day | ORAL | Status: DC
  Administered 2022-05-02 – 2022-05-08 (×7): 325 mg via ORAL
  Filled 2022-05-01 (×7): qty 1

## 2022-05-01 MED ORDER — ORAL CARE MOUTH RINSE
15.0000 mL | OROMUCOSAL | Status: DC
  Administered 2022-05-01 – 2022-05-02 (×2): 15 mL via OROMUCOSAL

## 2022-05-01 MED ORDER — DIPHENHYDRAMINE HCL 12.5 MG/5ML PO ELIX: 12.5000 mg | ORAL_SOLUTION | Freq: Four times a day (QID) | ORAL | Status: DC | PRN

## 2022-05-01 MED ORDER — PROCHLORPERAZINE EDISYLATE 10 MG/2ML IJ SOLN: 5.0000 mg | Freq: Four times a day (QID) | INTRAMUSCULAR | Status: DC | PRN

## 2022-05-01 MED ORDER — PHENOL 1.4 % MT LIQD: 1.0000 | OROMUCOSAL | Status: DC | PRN

## 2022-05-01 MED ORDER — CARVEDILOL 12.5 MG PO TABS
12.5000 mg | ORAL_TABLET | Freq: Two times a day (BID) | ORAL | Status: DC
  Administered 2022-05-02 – 2022-05-03 (×3): 12.5 mg via ORAL
  Filled 2022-05-01 (×3): qty 1

## 2022-05-01 MED ORDER — PROCHLORPERAZINE 25 MG RE SUPP: 12.5000 mg | Freq: Four times a day (QID) | RECTAL | Status: DC | PRN

## 2022-05-01 MED ORDER — ENOXAPARIN SODIUM 40 MG/0.4ML IJ SOSY
40.0000 mg | PREFILLED_SYRINGE | INTRAMUSCULAR | Status: DC
  Administered 2022-05-01 – 2022-05-07 (×7): 40 mg via SUBCUTANEOUS
  Filled 2022-05-01 (×7): qty 0.4

## 2022-05-01 MED ORDER — GUAIFENESIN-DM 100-10 MG/5ML PO SYRP: 5.0000 mL | ORAL_SOLUTION | Freq: Four times a day (QID) | ORAL | Status: DC | PRN

## 2022-05-01 MED ORDER — TRAZODONE HCL 50 MG PO TABS: 25.0000 mg | ORAL_TABLET | Freq: Every evening | ORAL | Status: DC | PRN

## 2022-05-01 MED ORDER — ALUM & MAG HYDROXIDE-SIMETH 200-200-20 MG/5ML PO SUSP: 30.0000 mL | ORAL | Status: DC | PRN

## 2022-05-01 MED ORDER — OXYCODONE-ACETAMINOPHEN 5-325 MG PO TABS: 1.0000 | ORAL_TABLET | ORAL | Status: DC | PRN

## 2022-05-01 MED ORDER — FLEET ENEMA 7-19 GM/118ML RE ENEM: 1.0000 | ENEMA | Freq: Once | RECTAL | Status: DC | PRN

## 2022-05-01 MED ORDER — ACETAMINOPHEN 325 MG PO TABS: 325.0000 mg | ORAL_TABLET | ORAL | Status: DC | PRN

## 2022-05-01 MED ORDER — DOXYCYCLINE HYCLATE 100 MG PO TABS
100.0000 mg | ORAL_TABLET | Freq: Two times a day (BID) | ORAL | Status: AC
  Administered 2022-05-01 – 2022-05-08 (×14): 100 mg via ORAL
  Filled 2022-05-01 (×14): qty 1

## 2022-05-01 MED ORDER — POLYETHYLENE GLYCOL 3350 17 G PO PACK: 17.0000 g | PACK | Freq: Every day | ORAL | Status: DC | PRN

## 2022-05-01 MED ORDER — BISACODYL 10 MG RE SUPP: 10.0000 mg | Freq: Every day | RECTAL | Status: DC | PRN

## 2022-05-01 MED ORDER — DOCUSATE SODIUM 100 MG PO CAPS
100.0000 mg | ORAL_CAPSULE | Freq: Every day | ORAL | Status: DC
  Administered 2022-05-02 – 2022-05-08 (×7): 100 mg via ORAL
  Filled 2022-05-01 (×7): qty 1

## 2022-05-01 MED ORDER — ASPIRIN 81 MG PO TBEC: 81.0000 mg | DELAYED_RELEASE_TABLET | Freq: Every day | ORAL | Status: DC

## 2022-05-01 MED ORDER — FAMOTIDINE 20 MG PO TABS
40.0000 mg | ORAL_TABLET | ORAL | Status: DC
  Administered 2022-05-03 – 2022-05-07 (×3): 40 mg via ORAL
  Filled 2022-05-01 (×3): qty 2

## 2022-05-01 MED ORDER — ROSUVASTATIN CALCIUM 20 MG PO TABS
40.0000 mg | ORAL_TABLET | Freq: Every day | ORAL | Status: DC
  Administered 2022-05-02 – 2022-05-07 (×6): 40 mg via ORAL
  Filled 2022-05-01 (×6): qty 2

## 2022-05-01 MED ORDER — PROCHLORPERAZINE MALEATE 5 MG PO TABS: 5.0000 mg | ORAL_TABLET | Freq: Four times a day (QID) | ORAL | Status: DC | PRN

## 2022-05-01 MED ORDER — CLOPIDOGREL BISULFATE 75 MG PO TABS
75.0000 mg | ORAL_TABLET | Freq: Every day | ORAL | Status: DC
  Administered 2022-05-02 – 2022-05-08 (×7): 75 mg via ORAL
  Filled 2022-05-01 (×7): qty 1

## 2022-05-01 MED ORDER — SERTRALINE HCL 25 MG PO TABS
12.5000 mg | ORAL_TABLET | Freq: Every day | ORAL | Status: DC
  Administered 2022-05-01: 12.5 mg via ORAL
  Filled 2022-05-01: qty 0.5

## 2022-05-01 MED ORDER — PANTOPRAZOLE SODIUM 20 MG PO TBEC
20.0000 mg | DELAYED_RELEASE_TABLET | ORAL | Status: DC
  Administered 2022-05-03 – 2022-05-07 (×3): 20 mg via ORAL
  Filled 2022-05-01 (×3): qty 1

## 2022-05-01 MED ORDER — QUETIAPINE FUMARATE 25 MG PO TABS: 12.5000 mg | ORAL_TABLET | Freq: Every day | ORAL | Status: DC | PRN

## 2022-05-01 NOTE — Progress Notes (Signed)
Kirsteins, Damon Salk, MD  Physician Other PMR Pre-admission    Signed Date of Service:  04/29/2022  1:46 PM  Related encounter: ED to Hosp-Admission (Discharged) from 04/21/2022 in [facility] 4E CV [place]   Signed      PMR Admission Coordinator Pre-Admission Assessment   Patient: Damon Walker is an 76 y.o., male MRN: [id number] DOB: 1946/02/14 Height: '5\' 8"'  (172.7 cm) Weight: 82.5 kg   Insurance Information HMO: yes    PPO:      PCP:      IPA:      80/20:      OTHER:  PRIMARY: Blue Medicare      Policy#: [id number]      Subscriber: pt CM Name: Damon Walker      Phone#: [phone]     Fax#: [phone] Pre-Cert#: [id number]  approved for 7 days from admit    Employer:  Benefits:  Phone #: [phone]     Name: 9/25 Eff. Date: 08/05/21     Deduct: none      Out of Pocket Max: $3900      Life Max: none CIR: $295 co pay per day days 1 until 6      SNF: no copay days 1 until 20; $196 co pay per day days 21 until 60; no c copay days 61 until 100 Outpatient: $25 per visit     Co-Pay: visits per medical neccesity Home Health: 100%      Co-Pay: visit per medical neccesity DME: 80%     Co-Pay: 20% Providers: in network  SECONDARY: none   Financial Counselor:       Phone#:    [employer] Engineer, [employer]" for patients in Inpatient Rehabilitation Facilities with attached "Privacy Act [place] Records" was provided and verbally reviewed with: Patient and Family   Emergency Contact Information Contact Information       Name Relation Home Work Mobile    Damon Walker,Damon Walker Spouse [phone]   [phone]    Damon Walker, Damon Walker     [phone]         Current Medical History  Patient Admitting Diagnosis: CVA   History of Present Illness: 76 year old male with history of carotid artery disease, CVA, combined systolic and diastolic CHF, CAD, HTN, HLD, T2 DM, CKD stage 3 . He presented on 04/21/22 with difficulty with speech and some confusion.    Imaging  revealed acute ischemic L MCA infarct. On 9/18 rapid response was called and patient transferred to ICU and placed on BIPAP with improvement of respiratory status. CTA head and neck 50 % diameter stenosis proximal left internal carotid artery. Diminutive right vertebral artery which appeared diffusely diseased and with minimal contribution to the basilar. 2 d echo with LVEF 30 to 35 %. Left ICA 40-59% stenosis. Recommend 30 day monitor to rule out afib as an outpatient. On Plavix pta. Now placed on ASA and Plavix.    Home meds of Coreg, Losartan and Lasix . Has declined AID in the past. TCAR 9/26 with Dr Scot Dock. Crestor resumed with LDL 69. Current smoker with counseling for cessation. Depression noted and started on Zoloft.       Complete NIHSS TOTAL: 4   Patient's medical record from [facility] has been reviewed by the rehabilitation admission coordinator and physician.   Past Medical History      Past Medical History:  Diagnosis Date   Carotid arterial disease ([place]) 06/2014; 01/09/2015    Followed by Dr. Donnetta Hutching of [facility] --  a) Carotid u/s: 40-59% bilat ICA stenosis; b) 10/2019: Carotid Dopplers October 20, 3891: R ICA 7-34%, LICA 28-76%.  Right vertebral artery appears occluded.  Normal subclavian Walker.  Normal left vertebral artery. -->  No change since 2020   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 ([place]) 05/14/2015    -- Given TPA.  MRI Brain: Acute L Occipital Lobe Infarct. Chronic microvascular ischemic changes in the white matter and left pons.  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   Chronic combined systolic and diastolic congestive heart failure, NYHA class 2 ([place]) 05/11/2015 - 05/15/15    a. EF 20-25% with anterior-anteroseptal akinesis (immediately post anterior STEMI).  ; b. 10/10/'16 Echo: EF  35-40% with moderate mid-apical anteroseptal, anterior and apical hypokinesis.   Coronary artery disease involving native heart 05/11/2015    Cath: 3V CAD with 90% prox RCA, 80% mid RCA, 80% OM3, (RCA and OM residual treated medically) 100% LAD - PCI to Prox-Mid LAD with Overlapping Promus Premier DES 3.0 x 38 & 3.0 x 16 (post-dilated to 3.5 mm)    Essential hypertension     H/O: GI bleed 05/15/2015    s/p Sigmoid Polypectomy 05/04/2015 - prior to STEMI on May 11, 2015; Had GI Bleed following TPA for CVA, while on ASA & Brilinta; Flex Sig - 1. Internal Hemorrhoids, 2. Distal sigmoid polypectomy site with flat White[place] status post biopsy and tattoo with 23m spot over 3 injections 3. Proximal sigmoid polypectomy site seen as well, 4) otherwise normal with no evidence of bleed   Hyperlipidemia with target LDL less than 70     Ischemic cardiomyopathy 05/11/2015    EF 40% on cath 05/11/2015 after anterior STEMI, EF 20-25% on echo 05/13/2015; 12/2015 =  EF ~45%.   Liver disease, chronic, with cirrhosis ([place] 11/2019    Mild possible early cirrhotic liver disease noted on CT scan.   Prediabetes Oct 2016    A1C 6.0 in Oct 2016   ST elevation myocardial infarction (STEMI) involving left anterior descending (LAD) coronary artery with complication ([place] 181/08/5724   100% Prox LAD - PCI with overlapping DES x 2   Tobacco abuse      a. 30 yrs - 1.5 ppd. - Quit 05/11/2015   Type 2 diabetes mellitus with other specified complication ([place] 32/10/5595  Vision abnormalities      Right hemianopsia    Has the patient had major surgery during 100 days prior to admission? No   Family History   family history includes Cancer in his mother; Other in his mother and another family member. He was adopted.   Current Medications   Current Facility-Administered Medications:    0.9 %  sodium chloride infusion, 500 mL, Intravenous, Once PRN, DAngelia Mould MD   acetaminophen (TYLENOL) tablet 650 mg, 650 mg, Oral,  Q4H PRN **OR** acetaminophen (TYLENOL) 160 MG/5ML solution 650 mg, 650 mg, Per Tube, Q4H PRN **OR** acetaminophen (TYLENOL) suppository 650 mg, 650 mg, Rectal, Q4H PRN, Baglia, Corrina, PA-C   acetaminophen (TYLENOL) tablet 325-650 mg, 325-650 mg, Oral, Q4H PRN **OR** acetaminophen (TYLENOL) suppository 325-650 mg, 325-650 mg, Rectal, Q4H PRN, DAngelia Mould MD   alum & mag hydroxide-simeth (MAALOX/MYLANTA) 200-200-20 MG/5ML suspension 15-30 mL, 15-30 mL, Oral, Q2H PRN, DAngelia Mould MD   carvedilol (COREG) tablet 12.5 mg, 12.5 mg, Oral, BID WC,  Baglia, Corrina, PA-C, 12.5 mg at 05/01/22 2951   Chlorhexidine Gluconate Cloth 2 % PADS 6 each, 6 each, Topical, Daily, Baglia, Corrina, PA-C, 6 each at 04/29/22 0935   clopidogrel (PLAVIX) tablet 75 mg, 75 mg, Oral, Daily, Baglia, Corrina, PA-C, 75 mg at 05/01/22 0903   dextrose 5 % and 0.45 % NaCl with KCl 20 mEq/L infusion, , Intravenous, Continuous, Angelia Mould, MD, Last Rate: 75 mL/hr at 05/01/22 0646, New Bag at 05/01/22 0646   docusate sodium (COLACE) capsule 100 mg, 100 mg, Oral, Daily, Angelia Mould, MD, 100 mg at 05/01/22 0903   famotidine (PEPCID) tablet 40 mg, 40 mg, Oral, QODAY, Baglia, Corrina, PA-C, 40 mg at 05/01/22 0903   ferrous sulfate tablet 325 mg, 325 mg, Oral, Q breakfast, Baglia, Corrina, PA-C, 325 mg at 05/01/22 0903   guaiFENesin-dextromethorphan (ROBITUSSIN DM) 100-10 MG/5ML syrup 15 mL, 15 mL, Oral, Q4H PRN, Angelia Mould, MD   heparin injection 5,000 Units, 5,000 Units, Subcutaneous, Q8H, Angelia Mould, MD, 5,000 Units at 05/01/22 1438   hydrALAZINE (APRESOLINE) injection 5 mg, 5 mg, Intravenous, Q20 Min PRN, Angelia Mould, MD   insulin aspart (novoLOG) injection 0-9 Units, 0-9 Units, Subcutaneous, TID WC, Baglia, Corrina, PA-C, 1 Units at 05/01/22 0634   labetalol (NORMODYNE) injection 10 mg, 10 mg, Intravenous, Q10 min PRN, Angelia Mould, MD   magnesium  sulfate IVPB 2 g 50 mL, 2 g, Intravenous, Daily PRN, Angelia Mould, MD   metoprolol tartrate (LOPRESSOR) injection 2-5 mg, 2-5 mg, Intravenous, Q2H PRN, Angelia Mould, MD   ondansetron [facility]) injection 4 mg, 4 mg, Intravenous, Q6H PRN, Baglia, Corrina, PA-C, 4 mg at 04/26/22 2229   ondansetron (ZOFRAN) injection 4 mg, 4 mg, Intravenous, Q6H PRN, Angelia Mould, MD   Oral care mouth rinse, 15 mL, Mouth Rinse, 4 times per day, Baglia, Corrina, PA-C, 15 mL at 04/30/22 2148   oxyCODONE-acetaminophen (PERCOCET/ROXICET) 5-325 MG per tablet 1-2 tablet, 1-2 tablet, Oral, Q4H PRN, Angelia Mould, MD   pantoprazole (PROTONIX) EC tablet 20 mg, 20 mg, Oral, QODAY, Baglia, Corrina, PA-C, 20 mg at 05/01/22 0903   phenol (CHLORASEPTIC) mouth spray 1 spray, 1 spray, Mouth/Throat, PRN, Angelia Mould, MD   potassium chloride SA (KLOR-CON M) CR tablet 20-40 mEq, 20-40 mEq, Oral, Daily PRN, Angelia Mould, MD   QUEtiapine (SEROQUEL) tablet 12.5 mg, 12.5 mg, Oral, Daily PRN, Baglia, Corrina, PA-C   rosuvastatin (CRESTOR) tablet 40 mg, 40 mg, Oral, q1800, Baglia, Corrina, PA-C, 40 mg at 04/30/22 1827   senna-docusate (Senokot-S) tablet 1 tablet, 1 tablet, Oral, QHS PRN, Baglia, Corrina, PA-C   [COMPLETED] sertraline (ZOLOFT) tablet 12.5 mg, 12.5 mg, Oral, Daily, 12.5 mg at 04/25/22 1639 **FOLLOWED BY** sertraline (ZOLOFT) tablet 12.5 mg, 12.5 mg, Oral, QHS, Baglia, Corrina, PA-C, 12.5 mg at 04/30/22 2148   Patients Current Diet:  Diet Order                  Diet regular Room service appropriate? Yes; Fluid consistency: Thin  Diet effective now                       Precautions / Restrictions Precautions Precautions: Fall Precaution Comments: HOH - hearing aides in room Restrictions Weight Bearing Restrictions: No    Has the patient had 2 or more falls or a fall with injury in the past year? No   Prior Activity Level Limited Community (1-2x/wk): not  acitve,  drives, spends alot of time on computer games   Prior Functional Level Self Care: Did the patient need help bathing, dressing, using the toilet or eating? Independent   Indoor Mobility: Did the patient need assistance with walking from room to room (with or without device)? Independent   Stairs: Did the patient need assistance with internal or external stairs (with or without device)? Independent   Functional Cognition: Did the patient need help planning regular tasks such as shopping or remembering to take medications? Independent   Patient Information Are you of Hispanic, Latino/a,or Spanish origin?: A. No, not of Hispanic, Latino/a, or Spanish origin What is your race?: A. White Do you need or want an interpreter to communicate with a doctor or health care staff?: 9. Unable to respond   Patient's Response To:  Health Literacy and Transportation Is the patient able to respond to health literacy and transportation needs?: No Health Literacy - How often do you need to have someone help you when you read instructions, pamphlets, or other written material from your doctor or pharmacy?: Patient unable to respond In the past 12 months, has lack of transportation kept you from medical appointments or from getting medications?: No In the past 12 months, has lack of transportation kept you from meetings, work, or from getting things needed for daily living?: No   Home Assistive Devices / Equipment Home Equipment: Grab bars - tub/shower   Prior Device Use: Indicate devices/aids used by the patient prior to current illness, exacerbation or injury? None of the above   Current Functional Level Cognition   Arousal/Alertness: Lethargic Overall Cognitive Status: Impaired/Different from baseline Difficult to assess due to: Impaired communication Current Attention Level: Sustained Orientation Level: Oriented to person Following Commands: Follows one step commands consistently, Follows  multi-step commands inconsistently (Could follow 2 step commands at times; some limitations due to hearing) Safety/Judgement: Decreased awareness of safety, Decreased awareness of deficits General Comments: Global aphasia. Following commands with visual demonstration due to pt being very hard of hearing and with some receptive difficulties. Pt requiring mod multimodal cues throughout session for attention to R side of environment Decreaed safety awareness. Attention: Focused, Sustained Focused Attention: Impaired Focused Attention Impairment: Verbal basic Sustained Attention: Impaired Sustained Attention Impairment: Verbal basic, Functional basic Awareness: Impaired Awareness Impairment: Intellectual impairment, Emergent impairment, Anticipatory impairment Problem Solving: Impaired Problem Solving Impairment: Functional basic, Verbal basic Behaviors: Impulsive Safety/Judgment: Impaired    Extremity Assessment (includes Sensation/Coordination)   Upper Extremity Assessment: Generalized weakness RUE Deficits / Details: Able to perform toileting and LB ADLs in standing without cueing to integrate BUE RUE Coordination: decreased fine motor, decreased gross motor LUE Deficits / Details: does not move to command, PROM is WFL, some grimacing with overhead movement. Pt able to move arm against gravity. Difficult to assess due to LOA LUE Coordination: decreased fine motor, decreased gross motor  Lower Extremity Assessment: Defer to PT evaluation     ADLs   Overall ADL's : Needs assistance/impaired Eating/Feeding: NPO Grooming: Wash/dry hands, Wash/dry face, Min guard, Standing Upper Body Bathing: Supervision/ safety Lower Body Bathing: Minimal assistance, Cueing for safety, Sit to/from stand Upper Body Dressing : Moderate assistance Upper Body Dressing Details (indicate cue type and reason): Poor motor planning UB dressing Lower Body Dressing: Min guard, Minimal assistance, Sit to/from  stand Lower Body Dressing Details (indicate cue type and reason): donning LB dressing during session with min guard-min A for steadying Functional mobility during ADLs: Minimal assistance General ADL Comments: Min A overall  with poor attention impacting safety.     Mobility   Overal bed mobility: Needs Assistance Bed Mobility: Supine to Sit Sidelying to sit: Min assist Supine to sit: Supervision Sit to supine: Supervision General bed mobility comments: Supervision but increased time and mod cues     Transfers   Overall transfer level: Needs assistance Equipment used: None Transfers: Sit to/from Stand Sit to Stand: Min guard Bed to/from chair/wheelchair/BSC transfer type:: Stand pivot Stand pivot transfers: Min assist General transfer comment: Light assist for balance recovery. Pt with good power up to full stand.     Ambulation / Gait / Stairs / Wheelchair Mobility   Ambulation/Gait Ambulation/Gait assistance: Herbalist (Feet): 300 Feet Assistive device: None Gait Pattern/deviations: Step-through pattern, Decreased stride length, Antalgic, Wide base of support General Gait Details: Min A at times to steady and mod cues for attending to R side.  Worked on finding objects on both sides -required increased time but was able to find and say room numbers, read caution sign, identify photo of trees on R and L.  RN spoke to pt on R side and he had hard time identifying but on way back to room she spoke to him from L and pt more attentive.  Pt not sure how to get back to his room after making a couple turns but once going in right direction pt looking in rooms on R and L until he saw his family. Gait velocity: Decreased Gait velocity interpretation: 1.31 - 2.62 ft/sec, indicative of limited community ambulator     Posture / Balance Dynamic Sitting Balance Sitting balance - Comments: Min guard for LB dressing sitting on edge of recliner Balance Overall balance assessment:  Needs assistance Sitting-balance support: No upper extremity supported, Feet supported Sitting balance-Leahy Scale: Good Sitting balance - Comments: Min guard for LB dressing sitting on edge of recliner Postural control: Posterior lean Standing balance support: No upper extremity supported Standing balance-Leahy Scale: Fair Standing balance comment: Ambulated with occasional min A but no RW High Level Balance Comments: Worked on scanning environment with ambulation.  Once in room reaching task in all direction (increased time to find objects on R), turned in circle, picked item for floor all with min guard.  No difficulty standing feet together EO and ECl     Special needs/care consideration      Previous Home Environment  Living Arrangements: Spouse/significant other  Lives With: Spouse Available Help at Discharge: Family, Available 24 hours/day (wife can take FMLA as needed or retire) Type of Home: Apartment Home Layout: One level Home Access: Level entry Bathroom Shower/Tub: Chiropodist: Standard Bathroom Accessibility: Yes How Accessible: Accessible via walker Home Care Services: No   Discharge Living Setting Plans for Discharge Living Setting: Lives with (comment), Apartment Type of Home at Discharge: Apartment Discharge Home Layout: One level Discharge Home Access: Level entry Discharge Bathroom Shower/Tub: Tub/shower unit Discharge Bathroom Toilet: Standard Discharge Bathroom Accessibility: Yes How Accessible: Accessible via walker Does the patient have any problems obtaining your medications?: No   Social/Family/Support Systems Patient Roles: Spouse Contact Information: wife, Damon Walker Anticipated Caregiver: wife Anticipated Ambulance person Information: see contacts Ability/Limitations of Caregiver: wife works but can take [employer] or reitre as needed Caregiver Availability: 24/7 Discharge Plan Discussed with Primary Caregiver: Yes Is Caregiver In  Agreement with Plan?: Yes Does Caregiver/Family have Issues with Lodging/Transportation while Pt is in Rehab?: No   Goals Patient/Family Goal for Rehab: Mod I to supervision with PT, OT  and SLP Expected length of stay: ELOS 7 to 10 days Pt/Family Agrees to Admission and willing to participate: Yes Program Orientation Provided & Reviewed with Pt/Caregiver Including Roles  & Responsibilities: Yes   Decrease burden of Care through IP rehab admission: n/a   Possible need for SNF placement upon discharge: not anticipated   Patient Condition: I have reviewed medical records from [facility], spoken with CM, and patient and spouse. I met with patient at the bedside for inpatient rehabilitation assessment.  Patient will benefit from ongoing PT, OT, and SLP, can actively participate in 3 hours of therapy a day 5 days of the week, and can make measurable gains during the admission.  Patient will also benefit from the coordinated team approach during an Inpatient Acute Rehabilitation admission.  The patient will receive intensive therapy as well as Rehabilitation physician, nursing, social worker, and care management interventions.  Due to bladder management, bowel management, safety, skin/wound care, disease management, medication administration, pain management, and patient education the patient requires 24 hour a day rehabilitation nursing.  The patient is currently min assist overall with mobility and basic ADLs.  Discharge setting and therapy post discharge at home with home health is anticipated.  Patient has agreed to participate in the Acute Inpatient Rehabilitation Program and will admit today.   Preadmission Screen Completed By:  Cleatrice Burke, 05/01/2022 2:57 PM ______________________________________________________________________   Discussed status with Dr. Letta Pate on 05/01/22 at 1458 and received approval for admission today.   Admission Coordinator:  Cleatrice Burke,  RN, time 1610 Date 05/01/22    Assessment/Plan: Diagnosis:  Left Parietal infarct Does the need for close, 24 hr/day Medical supervision in concert with the patient's rehab needs make it unreasonable for this patient to be served in a less intensive setting? Yes Co-Morbidities requiring supervision/potential complications: CKD3, DM Due to bladder management, bowel management, safety, skin/wound care, disease management, medication administration, pain management, and patient education, does the patient require 24 hr/day rehab nursing? Yes Does the patient require coordinated care of a physician, rehab nurse, PT, OT, and SLP to address physical and functional deficits in the context of the above medical diagnosis(es)? Yes Addressing deficits in the following areas: balance, endurance, locomotion, strength, transferring, bowel/bladder control, bathing, dressing, toileting, cognition, speech, language, swallowing, and psychosocial support Can the patient actively participate in an intensive therapy program of at least 3 hrs of therapy 5 days a week? Yes The potential for patient to make measurable gains while on inpatient rehab is good Anticipated functional outcomes upon discharge from inpatient rehab: supervision PT, supervision OT, supervision SLP Estimated rehab length of stay to reach the above functional goals is: 7-10d Anticipated discharge destination: Home 10. Overall Rehab/Functional Prognosis: good     MD Signature: Charlett Blake M.D. [facility] [employer] Fellow Am Acad of Phys Med and [place] Board of Electrodiagnostic Med Fellow Am Board of Interventional Pain          Revision History                                Note Details  Author Charlett Blake, MD File Time 05/01/2022  3:05 PM  Author Type Physician Status Signed  Last Editor Charlett Blake, [place] # [id number] [place] Date 05/01/2022

## 2022-05-01 NOTE — Progress Notes (Signed)
Occupational Therapy Treatment Patient Details Name: Damon Walker MRN: [id number] DOB: Aug 09, 1945 Today's Date: 05/01/2022   History of present illness The pt is a 76 yo male presenting 9/17 with AMS. 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 During night of 9/17 pt found in another room's bathroom, hypoxic, and diaphoretic. Placed on Bipap due to continued respiratory distress this has now been weaned.  Pt s/p TCAR on 04/30/22. PMH includes: CAD, CHF, HLD, CVA of L PCA in 2016, STEMI with DES x2, and DM II.   OT comments  Damon Walker is making great progress towards his acute goals and has plans to d/c to [facility] today. Pt did not require physical assist to complete Adls this session however needed min-max multimodal cues for R environment, problem solving and safety. If pt's LOS continues OT will continue to follow. POC remains appropriate.     Recommendations for follow up therapy are one component of a multi-disciplinary discharge planning process, led by the attending physician.  Recommendations may be updated based on patient status, additional functional criteria and insurance authorization.    Follow Up Recommendations  Acute inpatient rehab (3hours/day)    Assistance Recommended at Discharge Frequent or constant Supervision/Assistance  Patient can return home with the following  A little help with walking and/or transfers;A little help with bathing/dressing/bathroom;Direct supervision/assist for medications management;Direct supervision/assist for financial management   Equipment Recommendations  None recommended by OT    Recommendations for Other Services Rehab consult    Precautions / Restrictions Precautions Precautions: Fall Precaution Comments: HOH - hearing aides in room Restrictions Weight Bearing Restrictions: No       Mobility Bed Mobility Overal bed mobility: Needs Assistance Bed Mobility: Supine to Sit, Sit to Supine     Supine to  sit: Supervision Sit to supine: Supervision   General bed mobility comments: needed max cues to come to R side of bed    Transfers Overall transfer level: Needs assistance Equipment used: None Transfers: Sit to/from Stand Sit to Stand: Min guard                 Balance Overall balance assessment: Needs assistance Sitting-balance support: No upper extremity supported, Feet supported Sitting balance-Leahy Scale: Good     Standing balance support: No upper extremity supported Standing balance-Leahy Scale: Fair                             ADL either performed or assessed with clinical judgement   ADL Overall ADL's : Needs assistance/impaired                 Upper Body Dressing : Set up;Sitting   Lower Body Dressing: Min guard;Sit to/from stand Lower Body Dressing Details (indicate cue type and reason): cues for safety             Functional mobility during ADLs: Min guard General ADL Comments: cues for safety and problem solving. no physical assist needd for ADLs    Extremity/Trunk Assessment Upper Extremity Assessment Upper Extremity Assessment: Generalized weakness RUE Deficits / Details: Used funcitonally for ADL this date LUE Deficits / Details: Used functionally for ADLs this date   Lower Extremity Assessment Lower Extremity Assessment: Defer to PT evaluation        Vision   Vision Assessment?: Vision impaired- to be further tested in functional context   Perception Perception Perception: Impaired   Praxis Praxis Praxis: Impaired Praxis Impairment Details:  Ideation;Motor planning;Initiation;Ideomotor    Cognition Arousal/Alertness: Awake/alert Behavior During Therapy: Flat affect Overall Cognitive Status: Impaired/Different from baseline Area of Impairment: Orientation, Attention, Memory, Following commands, Safety/judgement, Awareness, Problem solving                 Orientation Level: Disoriented to, Situation,  Time Current Attention Level: Sustained Memory: Decreased short-term memory Following Commands: Follows one step commands with increased time Safety/Judgement: Decreased awareness of safety, Decreased awareness of deficits Awareness: Intellectual Problem Solving: Slow processing, Decreased initiation, Difficulty sequencing, Requires verbal cues, Requires tactile cues General Comments: pt benefits from multimodal cues due to [facility] and aphasia. Overall does well with simple one step functional cues.        Exercises      Shoulder Instructions       General Comments VSS on RA, wife present and supportive    Pertinent Vitals/ Pain       Pain Assessment Pain Assessment: No/denies pain Pain Intervention(s): Monitored during session  Home Living                                          Prior Functioning/Environment              Frequency  Min 2X/week        Progress Toward Goals  OT Goals(current goals can now be found in the care plan section)  Progress towards OT goals: Progressing toward goals  Acute Rehab OT Goals Patient Stated Goal: to CIR OT Goal Formulation: With family Time For Goal Achievement: 05/06/22 Potential to Achieve Goals: Good ADL Goals Pt Will Perform Grooming: with modified independence;standing Pt Will Perform Lower Body Dressing: with modified independence;sit to/from stand  Plan Discharge plan remains appropriate    Co-evaluation                 AM-PAC OT "6 Clicks" Daily Activity     Outcome Measure   Help from another person eating meals?: A Little Help from another person taking care of personal grooming?: A Little Help from another person toileting, which includes using toliet, bedpan, or urinal?: A Little Help from another person bathing (including washing, rinsing, drying)?: A Little Help from another person to put on and taking off regular upper body clothing?: A Little Help from another person to put on  and taking off regular lower body clothing?: A Little 6 Click Score: 18    End of Session    OT Visit Diagnosis: Unsteadiness on feet (R26.81);Other abnormalities of gait and mobility (R26.89);Muscle weakness (generalized) (M62.81)   Activity Tolerance Patient tolerated treatment well   Patient Left in chair;with call bell/phone within reach;with family/visitor present   Nurse Communication Mobility status        Time: 0100-7121 OT Time Calculation (min): 16 min  Charges: OT General Charges $OT Visit: 1 Visit OT Treatments $Self Care/Home Management : 8-22 mins    Elliot Cousin 05/01/2022, 4:46 PM

## 2022-05-01 NOTE — Progress Notes (Addendum)
Physical Therapy Treatment Patient Details Name: Damon Walker MRN: [id number] DOB: 1946/03/03 Today's Date: 05/01/2022   History of Present Illness The pt is a 76 yo male presenting 9/17 with AMS. 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 During night of 9/17 pt found in another room's bathroom, hypoxic, and diaphoretic. Placed on Bipap due to continued respiratory distress this has now been weaned.  Pt s/p TCAR on 04/30/22. PMH includes: CAD, CHF, HLD, CVA of L PCA in 2016, STEMI with DES x2, and DM II.    PT Comments    Pt s/p TCAR since last treatment , but no major functional changes so POC remains appropriate.  Did note pt had met goals, goals updated.  Session focused on safety with gait , identifying objects obstacles on both sides with ambulation, and balance.  Pt demonstrating improvement in ability to identify objects per wife.  Only needs min guard to min A for transfers but does require mod cues and increased time for planning.  Still needs mod cues to attend to R side.     Recommendations for follow up therapy are one component of a multi-disciplinary discharge planning process, led by the attending physician.  Recommendations may be updated based on patient status, additional functional criteria and insurance authorization.  Follow Up Recommendations  Acute inpatient rehab (3hours/day)     Assistance Recommended at Discharge Frequent or constant Supervision/Assistance  Patient can return home with the following Assistance with cooking/housework;Direct supervision/assist for medications management;Direct supervision/assist for financial management;Assist for transportation;Help with stairs or ramp for entrance;A little help with walking and/or transfers;A little help with bathing/dressing/bathroom;Assistance with feeding   Equipment Recommendations  Other (comment) (defer to post acute)    Recommendations for Other Services Rehab consult      Precautions / Restrictions Precautions Precautions: Fall Precaution Comments: HOH - hearing aides in room     Mobility  Bed Mobility Overal bed mobility: Needs Assistance Bed Mobility: Supine to Sit     Supine to sit: Supervision Sit to supine: Supervision   General bed mobility comments: Supervision but increased time and mod cues    Transfers Overall transfer level: Needs assistance Equipment used: None Transfers: Sit to/from Stand Sit to Stand: Min guard                Ambulation/Gait Ambulation/Gait assistance: Min assist Gait Distance (Feet): 300 Feet Assistive device: None   Gait velocity: Decreased     General Gait Details: Min A at times to steady and mod cues for attending to R side.  Worked on finding objects on both sides -required increased time but was able to find and say room numbers, read caution sign, identify photo of trees on R and L.  RN spoke to pt on R side and he had hard time identifying but on way back to room she spoke to him from L and pt more attentive.  Pt not sure how to get back to his room after making a couple turns but once going in right direction pt looking in rooms on R and L until he saw his family.   Stairs             Wheelchair Mobility    Modified Rankin (Stroke Patients Only) Modified Rankin (Stroke Patients Only) Pre-Morbid Rankin Score: No symptoms Modified Rankin: Moderately severe disability     Balance Overall balance assessment: Needs assistance Sitting-balance support: No upper extremity supported, Feet supported Sitting balance-Leahy Scale: Good  Standing balance support: No upper extremity supported Standing balance-Leahy Scale: Fair Standing balance comment: Ambulated with occasional min A but no RW               High Level Balance Comments: Worked on scanning environment with ambulation.  Once in room reaching task in all direction (increased time to find objects on R), turned in  circle, picked item for floor all with min guard.  No difficulty standing feet together EO and ECl            Cognition Arousal/Alertness: Awake/alert Behavior During Therapy: Flat affect Overall Cognitive Status: Impaired/Different from baseline Area of Impairment: Orientation, Attention, Memory, Following commands, Safety/judgement, Awareness, Problem solving                 Orientation Level: Disoriented to, Place, Time, Situation Current Attention Level: Sustained Memory: Decreased short-term memory Following Commands: Follows one step commands consistently, Follows multi-step commands inconsistently (Could follow 2 step commands at times; some limitations due to hearing) Safety/Judgement: Decreased awareness of safety, Decreased awareness of deficits Awareness: Intellectual Problem Solving: Slow processing, Decreased initiation, Difficulty sequencing, Requires verbal cues, Requires tactile cues General Comments: Global aphasia. Following commands with visual demonstration due to pt being very hard of hearing and with some receptive difficulties. Pt requiring mod multimodal cues throughout session for attention to R side of environment Decreaed safety awareness.        Exercises      General Comments        Pertinent Vitals/Pain Pain Assessment Pain Assessment: No/denies pain    Home Living                          Prior Function            PT Goals (current goals can now be found in the care plan section) Acute Rehab PT Goals Time For Goal Achievement: 05/15/22 Progress towards PT goals: Goals met and updated - see care plan    Frequency    Min 4X/week      PT Plan Current plan remains appropriate    Co-evaluation              AM-PAC PT "6 Clicks" Mobility   Outcome Measure  Help needed turning from your back to your side while in a flat bed without using bedrails?: A Little Help needed moving from lying on your back to sitting  on the side of a flat bed without using bedrails?: A Lot (cues) Help needed moving to and from a bed to a chair (including a wheelchair)?: A Little Help needed standing up from a chair using your arms (e.g., wheelchair or bedside chair)?: A Little Help needed to walk in hospital room?: A Lot (cues) Help needed climbing 3-5 steps with a railing? : A Lot (cues) 6 Click Score: 15    End of Session Equipment Utilized During Treatment: Gait belt Activity Tolerance: Patient tolerated treatment well Patient left: with call bell/phone within reach;with family/visitor present;in bed;with bed alarm set Nurse Communication: Mobility status PT Visit Diagnosis: Other abnormalities of gait and mobility (R26.89);Muscle weakness (generalized) (M62.81);Unsteadiness on feet (R26.81);Difficulty in walking, not elsewhere classified (R26.2);Other symptoms and signs involving the nervous system (Z02.585)     Time: 2778-2423 PT Time Calculation (min) (ACUTE ONLY): 26 min  Charges:  $Gait Training: 8-22 mins $Neuromuscular Re-education: 8-22 mins  Abran Richard, PT Acute Rehab [facility] [phone]    Karlton Lemon 05/01/2022, 1:06 PM

## 2022-05-01 NOTE — Discharge Summary (Signed)
Physician Discharge Summary   Patient: Damon Walker MRN: [id number] DOB: Dec 11, 1945  Admit date:     04/21/2022  Discharge date: 05/01/22  Discharge Physician: Cordelia Poche, MD   PCP: [name], MD   Recommendations at discharge:  Neurology follow-up in 4 weeks When close to discharge, please consult cardiology to arrange an outpatient 30 day event monitor  Discharge Diagnoses: Principal Problem:   Acute CVA (cerebrovascular accident) [facility]) Active Problems:   CAD-native artery   Essential hypertension   Hyperlipidemia with target LDL less than 70   Carotid arterial disease ([place])   CKD (chronic kidney disease), stage III ([place])   Type 2 diabetes mellitus with other specified complication ([place])   Combined systolic and diastolic heart failure ([place])   Acute respiratory failure with hypoxia and hypercapnia ([place])   Hypokalemia  Resolved Problems:   * No resolved hospital problems. [facility] Course: Damon Walker is a 76 y.o. male with a history of CAD s/p PCI and stent placement, CVA, combined heart failure, carotid artery stenosis, hyperlipidemia, diabetes mellitus type 2, CKD stage IIIa. Patient presented secondary to speech difficulty and confusion. Code stroke activated and CT head without acute findings. MRI significant for acute CVA involving the left parietal lobe and CTA head neck was significant for perfusion abnormality in the right temporal/parietal lobes. Left ICA stenosis noted and vascular surgery was consulted for TCAR which was performed on 9/26. Recommendation for dual antiplatelet therapy. PT/Ot recommending acute inpatient rehabilitation on discharge.  Assessment and Plan:  Acute CVA MRI confirms acute infarct of the left parietal lobe. CTA head and neck significant for 50% stenosis of proximal left internal carotid artery. Carotid ultrasound significant for left ICA stenosis measuring 40-59%. LDL of 69. Hemoglobin A1C of 5.8%. Transthoracic Echocardiogram  significant for LVEF of 30-35%. Neurology recommendations for follow-up in 4 weeks. PT/OT recommendations for acute inpatient rehabilitation on discharge. Patient discharged on dual antiplatelet therapy (aspirin 81 mg x 30 days and Plavix 75 mg daily indefinitely). Recommendation for Neurology follow-up in 4 weeks. Recommendation for a 30 day event monitor as an outpatient.  Probable delirium Delirium precautions. Patient managed with Seroquel which was eventually not required. Appears to be resolved.  Carotid artery disease CTA and carotid artery ultrasound results as mentioned above. Vascular surgery consulted and performed TCAR on 9/26. Recommendation for statin and dual antiplatelet therapy on discharge (aspirin for 30 days, Plavix indefinitely)  CAD Patient has a history of STEMI s/p DES. Stable. Continue Coreg and Crestor. Patient is discharged on dual antiplatelet therapy with aspirin therapy for 30 days and Plavix therapy indefinitely.  Chronic combined systolic and diastolic heart failure Stable. Euvolemic. LVEF of 30-35% with global hypokinesis, akinesis of the LV apical segment of anterior wall and grade 1 diastolic dysfunction. Continue losartan, Lasix, Coreg as previously prescribed.  Diabetes mellitus, type 2 Controlled. Hemoglobin A1C of 5.8%.  CKD stage IIIa  Hyperlipidemia Continue Crestor.  Primary hypertension Continue Coreg. Patient is on losartan as needed, which can be continued.  Hypokalemia Patient given supplementation. Resolved.  Consultants: Neurology, Vascular surgery Procedures performed: Transthoracic Echocardiogram, TCAR  Disposition: Acute inpatient rehabilitation Diet recommendation: Heart healthy  DISCHARGE MEDICATION: Allergies as of 05/01/2022       Reactions   Latex Hives, Itching, Rash, Other (See Comments)   Blisters        Medication List     TAKE these medications    aspirin EC 81 MG tablet Take 1 tablet (81 mg total) by mouth  daily. Swallow whole. 30 days only   carvedilol 12.5 MG tablet Commonly known as: COREG Take 1 tablet (12.5 mg total) by mouth 2 (two) times daily.   clopidogrel 75 MG tablet Commonly known as: PLAVIX Take 1 tablet (75 mg total) by mouth daily.   famotidine 40 MG tablet Commonly known as: PEPCID Take 40 mg by mouth every other day.   ferrous sulfate 325 (65 FE) MG tablet Take 325 mg by mouth daily with breakfast.   furosemide 20 MG tablet Commonly known as: LASIX Take 1 tablet (20 mg total) by mouth daily as needed. What changed: reasons to take this   losartan 25 MG tablet Commonly known as: COZAAR TAKE 1 TABLET BY MOUTH ONCE DAILY AS NEEDED What changed:  how much to take how to take this when to take this reasons to take this additional instructions   nitroGLYCERIN 0.4 MG SL tablet Commonly known as: NITROSTAT Place 1 tablet (0.4 mg total) under the tongue every 5 (five) minutes x 3 doses as needed for chest pain.   pantoprazole 20 MG tablet Commonly known as: PROTONIX Take 20 mg by mouth every other day.   rosuvastatin 40 MG tablet Commonly known as: CRESTOR Take 1 tablet (40 mg total) by mouth daily at 6 PM.        Follow-up Information     [facility]. Schedule an appointment as soon as possible for a visit in 1 month(s).   Specialty: Neurology Why: stroke clinic Contact information: [place] [phone]               Discharge Exam: BP (!) 122/47   Pulse 69   Temp (!) 97.5 F (36.4 C) (Oral)   Resp 19   Ht '5\' 8"'$  (1.727 m)   Wt 82.5 kg   SpO2 96%   BMI 27.65 kg/m   General exam: Appears calm and comfortable Respiratory system: Clear to auscultation. Respiratory effort normal. Cardiovascular system: S1 & S2 heard, RRR. Gastrointestinal system: Abdomen is nondistended, soft and nontender. No organomegaly or masses felt. Normal bowel sounds heard. Central nervous  system: Alert and oriented to person. Follows commands. Musculoskeletal:  No calf tenderness  Condition at discharge: stable  The results of significant diagnostics from this hospitalization (including imaging, microbiology, ancillary and laboratory) are listed below for reference.   Imaging Studies: DG C-Arm 1-60 Min-No Report  Result Date: 04/30/2022 Fluoroscopy was utilized by the requesting physician.  No radiographic interpretation.   VAS US CAROTID  Result Date: 04/25/2022 Carotid Arterial Duplex Study Patient Name:  Damon Walker  Date of Exam:   04/25/2022 Medical Rec #: [id number]         Accession #:    [id number] Date of Birth: January 04, 1946          Patient Gender: M Patient Age:   34 years Exam Location:  [facility] Procedure:      VAS US CAROTID Referring Phys: Harrell Gave DICKSON --------------------------------------------------------------------------------  Indications:       Carotid artery disease. Risk Factors:      Hypertension, hyperlipidemia, Diabetes, past history of                    smoking, coronary artery disease. Limitations        Today's exam was limited due to the patient's inability or  unwillingness to cooperate. Comparison Study:  10/23/2021 carotid artery duplex- right 1-39% ICA stenosis,                    left 40-59% ICA stenosis. Performing Technologist: Maudry Mayhew MHA, RDMS, RVT, RDCS Supporting Technologist: Rogelia Rohrer RVT, RDMS  Examination Guidelines: A complete evaluation includes B-mode imaging, spectral Doppler, color Doppler, and power Doppler as needed of all accessible portions of each vessel. Bilateral testing is considered an integral part of a complete examination. Limited examinations for reoccurring indications may be performed as noted.  Right Carotid Findings: +----------+--------+-------+--------+----------------------+------------------+           PSV cm/sEDV    StenosisPlaque Description    Comments                              cm/s                                                    +----------+--------+-------+--------+----------------------+------------------+ CCA Prox  66      10                                   intimal thickening +----------+--------+-------+--------+----------------------+------------------+ CCA Distal88      19             smooth and                                                                heterogenous                             +----------+--------+-------+--------+----------------------+------------------+ ICA Prox  123     29             smooth and                                                                heterogenous                             +----------+--------+-------+--------+----------------------+------------------+ ICA Distal80      23                                                      +----------+--------+-------+--------+----------------------+------------------+ +----------+--------+-------+------------+-------------------+           PSV cm/sEDV cmsDescribe    Arm Pressure (mmHG) +----------+--------+-------+------------+-------------------+ Subclavian               Not assessed                    +----------+--------+-------+------------+-------------------+ +---------+--------+--------+------------+  [place] cm/sEDV cm/sNot assessed +---------+--------+--------+------------+  Left Carotid Findings: +----------+--------+--------+--------+--------------------------+--------+           PSV cm/sEDV cm/sStenosisPlaque Description        Comments +----------+--------+--------+--------+--------------------------+--------+ CCA Prox  89      16                                                 +----------+--------+--------+--------+--------------------------+--------+ CCA Distal76      12                                                  +----------+--------+--------+--------+--------------------------+--------+ ICA Prox  209     55      40-59%  heterogenous and irregular         +----------+--------+--------+--------+--------------------------+--------+ ICA Mid   207     40              heterogenous and irregular         +----------+--------+--------+--------+--------------------------+--------+ ICA Distal161     31                                                 +----------+--------+--------+--------+--------------------------+--------+ ECA       126     13                                                 +----------+--------+--------+--------+--------------------------+--------+ +----------+--------+--------+------------+-------------------+           PSV cm/sEDV cm/sDescribe    Arm Pressure (mmHG) +----------+--------+--------+------------+-------------------+ Subclavian                Not assessed                    +----------+--------+--------+------------+-------------------+ +---------+--------+--+--------+--+---------+ VertebralPSV cm/s76EDV cm/s13Antegrade +---------+--------+--+--------+--+---------+   Summary:   *See table(s) above for measurements and observations.  Electronically signed by Monica Martinez MD on 04/25/2022 at 2:20:40 PM.    Final    ECHOCARDIOGRAM COMPLETE  Result Date: 04/22/2022    ECHOCARDIOGRAM REPORT   Patient Name:   Damon Walker Date of Exam: 04/22/2022 Medical Rec #:  [id number]        Height:       68.0 in Accession #:    [id number]       Weight:       192.9 lb Date of Birth:  07/17/1946         BSA:          2.013 m Patient Age:    57 years         BP:           112/44 mmHg Patient Gender: M                HR:           50 bpm. Exam Location:  Inpatient Procedure: 2D Echo and Intracardiac Opacification Agent Indications:    stroke  History:  Patient has prior history of Echocardiogram examinations, most                 recent 11/21/2016. Cardiomyopathy,  CAD, Stroke; Risk                 Factors:Hypertension, Dyslipidemia, Diabetes and Former Smoker.  Sonographer:    Harvie Junior Referring Phys: [id number] [place] E DE LA TORRE  Sonographer Comments: Technically difficult study due to poor echo windows and no subcostal window. Image acquisition challenging due to uncooperative patient and supine. IMPRESSIONS  1. Left ventricular ejection fraction, by estimation, is 30 to 35%. The left ventricle has moderately decreased function. The left ventricle demonstrates global hypokinesis. Left ventricular diastolic parameters are consistent with Grade I diastolic dysfunction (impaired relaxation). There is akinesis of the left ventricular, apical apical segment and anterior wall.  2. Right ventricular systolic function is normal. The right ventricular size is normal. There is normal pulmonary artery systolic pressure.  3. The mitral valve is normal in structure. Trivial mitral valve regurgitation. No evidence of mitral stenosis.  4. The aortic valve is normal in structure. Aortic valve regurgitation is not visualized. No aortic stenosis is present.  5. The inferior vena cava is normal in size with greater than 50% respiratory variability, suggesting right atrial pressure of 3 mmHg. Conclusion(s)/Recommendation(s): No intracardiac source of embolism detected on this transthoracic study. Consider a transesophageal echocardiogram to exclude cardiac source of embolism if clinically indicated. FINDINGS  Left Ventricle: Left ventricular ejection fraction, by estimation, is 30 to 35%. The left ventricle has moderately decreased function. The left ventricle demonstrates global hypokinesis. The left ventricular internal cavity size was normal in size. There is no left ventricular hypertrophy. Left ventricular diastolic parameters are consistent with Grade I diastolic dysfunction (impaired relaxation). Normal left ventricular filling pressure. Right Ventricle: The right ventricular size  is normal. No increase in right ventricular wall thickness. Right ventricular systolic function is normal. There is normal pulmonary artery systolic pressure. The tricuspid regurgitant velocity is 1.07 m/s, and  with an assumed right atrial pressure of 3 mmHg, the estimated right ventricular systolic pressure is 7.6 mmHg. Left Atrium: Left atrial size was normal in size. Right Atrium: Right atrial size was normal in size. Pericardium: There is no evidence of pericardial effusion. Mitral Valve: The mitral valve is normal in structure. Trivial mitral valve regurgitation. No evidence of mitral valve stenosis. Tricuspid Valve: The tricuspid valve is normal in structure. Tricuspid valve regurgitation is trivial. No evidence of tricuspid stenosis. Aortic Valve: The aortic valve is normal in structure. Aortic valve regurgitation is not visualized. No aortic stenosis is present. Aortic valve mean gradient measures 1.0 mmHg. Aortic valve peak gradient measures 2.6 mmHg. Aortic valve area, by VTI measures 4.05 cm. Pulmonic Valve: The pulmonic valve was normal in structure. Pulmonic valve regurgitation is not visualized. No evidence of pulmonic stenosis. Aorta: The aortic root is normal in size and structure. Venous: The inferior vena cava is normal in size with greater than 50% respiratory variability, suggesting right atrial pressure of 3 mmHg. IAS/Shunts: No atrial level shunt detected by color flow Doppler.  LEFT VENTRICLE PLAX 2D LVIDd:         5.00 cm      Diastology LVIDs:         4.80 cm      LV e' medial:    3.89 cm/s LV PW:         1.10 cm      LV E/e'  medial:  9.5 LV IVS:        1.10 cm      LV e' lateral:   6.24 cm/s LVOT diam:     2.20 cm      LV E/e' lateral: 5.9 LV SV:         75 LV SV Index:   37 LVOT Area:     3.80 cm  LV Volumes (MOD) LV vol d, MOD A2C: 125.0 ml LV vol d, MOD A4C: 140.0 ml LV vol s, MOD A2C: 71.0 ml LV vol s, MOD A4C: 67.7 ml LV SV MOD A2C:     54.0 ml LV SV MOD A4C:     140.0 ml LV SV MOD  BP:      64.2 ml RIGHT VENTRICLE RV Basal diam:  3.40 cm RV Mid diam:    2.80 cm RV S prime:     6.32 cm/s TAPSE (M-mode): 1.8 cm LEFT ATRIUM             Index        RIGHT ATRIUM           Index LA Vol (A2C):   61.0 ml 30.31 ml/m  RA Area:     13.80 cm LA Vol (A4C):   34.9 ml 17.34 ml/m  RA Volume:   29.90 ml  14.86 ml/m LA Biplane Vol: 46.0 ml 22.86 ml/m  AORTIC VALVE                    PULMONIC VALVE AV Area (Vmax):    3.78 cm     PV Vmax:       0.71 m/s AV Area (Vmean):   3.68 cm     PV Peak grad:  2.0 mmHg AV Area (VTI):     4.05 cm AV Vmax:           80.10 cm/s AV Vmean:          52.300 cm/s AV VTI:            0.186 m AV Peak Grad:      2.6 mmHg AV Mean Grad:      1.0 mmHg LVOT Vmax:         79.70 cm/s LVOT Vmean:        50.600 cm/s LVOT VTI:          0.198 m LVOT/AV VTI ratio: 1.06  AORTA Ao Root diam: 3.40 cm Ao Asc diam:  3.10 cm MITRAL VALVE               TRICUSPID VALVE MV Area (PHT): 2.14 cm    TR Peak grad:   4.6 mmHg MV Decel Time: 354 msec    TR Vmax:        107.00 cm/s MR Peak grad: 15.5 mmHg MR Vmax:      197.00 cm/s  SHUNTS MV E velocity: 37.00 cm/s  Systemic VTI:  0.20 m MV A velocity: 49.20 cm/s  Systemic Diam: 2.20 cm MV E/A ratio:  0.75 Fransico Him MD Electronically signed by Fransico Him MD Signature Date/Time: 04/22/2022/1:32:17 PM    Final    DG CHEST PORT 1 VIEW  Result Date: 04/22/2022 CLINICAL DATA:  Respiratory distress EXAM: PORTABLE CHEST 1 VIEW COMPARISON:  04/21/2022 FINDINGS: Unchanged bilateral interstitial opacities. Mild cardiomegaly. No pleural effusion or pneumothorax. No focal airspace consolidation. IMPRESSION: Unchanged bilateral interstitial opacities, possibly mild pulmonary edema superimposed on scarring. Electronically Signed   By: Cletus Gash.D.  On: 04/22/2022 00:23   MR BRAIN WO CONTRAST  Result Date: 04/21/2022 CLINICAL DATA:  Stroke EXAM: MRI HEAD WITHOUT CONTRAST TECHNIQUE: Multiplanar, multiecho pulse sequences of the brain and surrounding  structures were obtained without intravenous contrast. COMPARISON:  CT head 04/21/2022 FINDINGS: Brain: Patient scanned in the decubitus position. Mild motion on the study. Acute infarct left parietal lobe involving the cortex and white matter and extending to the posterior insula. Generalized atrophy. Chronic infarct left occipital lobe. Chronic infarct right parietal lobe. Chronic microvascular ischemic change in the white matter. Chronic infarcts in the thalamus bilaterally. Negative for hemorrhage CSF cyst anterior to the right temporal lobe measures 27 x 42 mm compatible with arachnoid cyst. This appears to dissect lateral to the right temporal lobe similar to the prior MRI 2016 Vascular: Negative for hyperdense vessel Skull and upper cervical spine: No focal skeletal lesion. Sinuses/Orbits: Mild mucosal edema paranasal sinuses. 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 Electronically Signed   By: Franchot Gallo M.D.   On: 04/21/2022 18:50   DG Abd 1 View  Result Date: 04/21/2022 CLINICAL DATA:  Metal screening prior to MRI. EXAM: ABDOMEN - 1 VIEW COMPARISON:  05/23/2015. FINDINGS: Normal bowel gas pattern. Residual contrast noted within nondilated intrarenal collecting systems and ureters, as well as the bladder. No radiopaque foreign body. Skeletal structures are unremarkable. IMPRESSION: 1. No radiopaque/metallic foreign body.  No contraindication to MRI. Electronically Signed   By: Lajean Manes M.D.   On: 04/21/2022 17:19   DG CHEST PORT 1 VIEW  Result Date: 04/21/2022 CLINICAL DATA:  Neurologic deficit EXAM: PORTABLE CHEST 1 VIEW COMPARISON:  07/08/2016 FINDINGS: Two frontal views of the chest demonstrate a stable cardiac silhouette. Chronic interstitial opacities are seen  throughout the lungs consistent with scarring. No acute airspace disease, effusion, or pneumothorax. No acute bony abnormalities. IMPRESSION: 1. No acute intrathoracic process. Electronically Signed   By: Randa Ngo M.D.   On: 04/21/2022 16:23   CT ANGIO HEAD NECK W WO CM W PERF (CODE STROKE)  Result Date: 04/21/2022 CLINICAL DATA:  Acute neuro deficit. EXAM: CT ANGIOGRAPHY HEAD AND NECK CT PERFUSION BRAIN TECHNIQUE: Multidetector CT imaging of the head and neck was performed using the standard protocol during bolus administration of intravenous contrast. Multiplanar CT image reconstructions and MIPs were obtained to evaluate the vascular anatomy. Carotid stenosis measurements (when applicable) are obtained utilizing NASCET criteria, using the distal internal carotid diameter as the denominator. Multiphase CT imaging of the brain was performed following IV bolus contrast injection. Subsequent parametric perfusion maps were calculated using RAPID software. RADIATION DOSE REDUCTION: This exam was performed according to the departmental dose-optimization program which includes automated exposure control, adjustment of the mA and/or kV according to patient size and/or use of iterative reconstruction technique. CONTRAST:  40m OMNIPAQUE IOHEXOL 350 MG/ML SOLN COMPARISON:  CT head 04/21/2022 FINDINGS: CTA NECK FINDINGS Aortic arch: Atherosclerotic calcification aortic arch. Atherosclerotic disease in the proximal great vessels without significant stenosis Right carotid system: Extensive atherosclerotic disease throughout the right common carotid artery and right internal carotid artery. No significant stenosis. Left carotid system: Atherosclerotic disease throughout the left common carotid artery and left internal carotid artery. 50% diameter stenosis proximal left internal carotid artery. Vertebral arteries: Left vertebral artery dominant. Atherosclerotic calcification proximally without significant stenosis.  Mild atherosclerotic calcification  distal left vertebral artery. Diminutive right vertebral artery which is probably diffusely disease with minimal flow. Skeleton: No acute skeletal abnormality.  Cervical spondylosis. Other neck: Negative for mass or adenopathy in the neck. Upper chest: Lung apices clear bilaterally Review of the MIP images confirms the above findings CTA HEAD FINDINGS Anterior circulation: Atherosclerotic calcification and mild stenosis in the cavernous carotid bilaterally. Anterior and middle cerebral arteries patent bilaterally without large vessel occlusion. Mild atherosclerotic irregularity in the middle cerebral arteries bilaterally. Negative for aneurysm. Posterior circulation: Left vertebral artery is widely patent to the basilar. Diminutive right vertebral artery has mild contribution to the basilar. Basilar patent. Superior cerebellar and posterior cerebral arteries patent bilaterally without stenosis. No aneurysm. Venous sinuses: Normal venous enhancement Anatomic variants: None Review of the MIP images confirms the above findings CT Brain Perfusion Findings: ASPECTS: 10 CBF (<30%) Volume: 39ML Perfusion (Tmax>6.0s) volume: 33ML Mismatch Volume: -20m Infarction Location:Small core infarct right anterolateral temporal lobe Small core infarct and decreased perfusion in the left parietal lobe. IMPRESSION: 1. CT perfusion is abnormal bilaterally. This involves the right temporal lobe and left parietal lobe. Recommend MRI to confirm evidence of acute infarction. 2. Negative for intracranial large vessel occlusion 3. Extensive atherosclerotic disease in the carotid bilaterally. 50% diameter stenosis proximal left internal carotid artery 4. Left vertebral artery widely patent. Diminutive right vertebral artery which appears diffusely diseased and has minimal contribution to the basilar. Electronically Signed   By: CFranchot GalloM.D.   On: 04/21/2022 14:11   CT HEAD WO CONTRAST  Result Date:  04/21/2022 CLINICAL DATA:  Acute neuro deficit rule out stroke. EXAM: CT HEAD WITHOUT CONTRAST TECHNIQUE: Contiguous axial images were obtained from the base of the skull through the vertex without intravenous contrast. RADIATION DOSE REDUCTION: This exam was performed according to the departmental dose-optimization program which includes automated exposure control, adjustment of the mA and/or kV according to patient size and/or use of iterative reconstruction technique. COMPARISON:  MRI head 05/15/2015 FINDINGS: Brain: Negative for acute infarct, hemorrhage, mass Mild atrophy. Negative for hydrocephalus. Chronic infarct left occipital lobe and right parietal lobe. CSF density fluid anterior to the right temporal lobe measuring 24 x 41 mm unchanged from the prior study. Based on the MRI, this fluid may extend lateral to the right temporal lobe where there is prominent CSF similar to the prior study. 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 No midline shift. Vascular: Negative for hyperdense vessel Skull: Negative Sinuses/Orbits: Paranasal sinuses clear. Bilateral mastoid effusion. Bilateral middle ear effusion. Chronic mastoiditis bilaterally. Other: None IMPRESSION: No acute intracranial abnormality no change from the prior MRI. Bilateral mastoid and middle ear effusion Electronically Signed   By: CFranchot GalloM.D.   On: 04/21/2022 13:46    Microbiology: Results for orders placed or performed during the hospital encounter of 04/21/22  Resp Panel by RT-PCR (Flu A&B, Covid) Anterior Nasal Swab     Status: None   Collection Time: 04/21/22  2:42 PM   Specimen: Anterior Nasal Swab  Result Value Ref Range Status   SARS Coronavirus 2 by RT PCR NEGATIVE NEGATIVE Final    Comment: (NOTE) SARS-CoV-2 target nucleic acids are NOT DETECTED.  The SARS-CoV-2 RNA is generally detectable in upper respiratory specimens during the acute phase of infection. The lowest concentration of SARS-CoV-2 viral copies this assay can detect  is 138 copies/mL. A negative result does not preclude SARS-Cov-2 infection and should not be used as the sole basis for treatment or other patient management decisions. A negative result  may occur with  improper specimen collection/handling, submission of specimen other than nasopharyngeal swab, presence of viral mutation(s) within the areas targeted by this assay, and inadequate number of viral copies(<138 copies/mL). A negative result must be combined with clinical observations, patient history, and epidemiological information. The expected result is Negative.  Fact Sheet for Patients:  EntrepreneurPulse.com.au  Fact Sheet for Healthcare Providers:  IncredibleEmployment.be  This test is no t yet approved or cleared by the [place] FDA and  has been authorized for detection and/or diagnosis of SARS-CoV-2 by FDA under an Emergency Use Authorization (EUA). This EUA will remain  in effect (meaning this test can be used) for the duration of the COVID-19 declaration under Section 564(b)(1) of the Act, 21 U.S.C.section 360bbb-3(b)(1), unless the authorization is terminated  or revoked sooner.       Influenza A by PCR NEGATIVE NEGATIVE Final   Influenza B by PCR NEGATIVE NEGATIVE Final    Comment: (NOTE) The Xpert Xpress SARS-CoV-2/FLU/RSV plus assay is intended as an aid in the diagnosis of influenza from Nasopharyngeal swab specimens and should not be used as a sole basis for treatment. Nasal washings and aspirates are unacceptable for Xpert Xpress SARS-CoV-2/FLU/RSV testing.  Fact Sheet for Patients: EntrepreneurPulse.com.au  Fact Sheet for Healthcare Providers: IncredibleEmployment.be  This test is not yet approved or cleared by the [place] FDA and has been authorized for detection and/or diagnosis of SARS-CoV-2 by FDA under an Emergency Use Authorization (EUA). This EUA will remain in effect  (meaning this test can be used) for the duration of the COVID-19 declaration under Section 564(b)(1) of the Act, 21 U.S.C. section 360bbb-3(b)(1), unless the authorization is terminated or revoked.  Performed at [facility], [place] [address], [place], [place] [address]   MRSA Next Gen by PCR, Nasal     Status: None   Collection Time: 04/22/22  1:28 AM   Specimen: Nasal Mucosa; Nasal Swab  Result Value Ref Range Status   MRSA by PCR Next Gen NOT DETECTED NOT DETECTED Final    Comment: (NOTE) The GeneXpert MRSA Assay (FDA approved for NASAL specimens only), is one component of a comprehensive MRSA colonization surveillance program. It is not intended to diagnose MRSA infection nor to guide or monitor treatment for MRSA infections. Test performance is not FDA approved in patients less than 57 years old. Performed at [facility], [place] [address] [place], [place] [address]     Labs: CBC: Recent Labs  Lab 04/25/22 [phone] 04/27/22 0854 04/29/22 0850 04/30/22 0826 05/01/22 0635  WBC 8.9 9.6 8.5  --  12.1*  HGB 13.7 13.3 13.7 11.6* 11.8*  HCT 40.5 40.1 41.8 34.0* 34.3*  MCV 90.2 92.0 92.1  --  90.0  PLT 216 204 215  --  983   Basic Metabolic Panel: Recent Labs  Lab 04/25/22 0336 04/26/22 0831 04/27/22 0854 04/29/22 0850 04/30/22 0826 05/01/22 0635  NA 140 141 139 141 142 140  K 3.3* 4.0 3.6 4.0 4.0 3.7  CL 102 107 107 106  --  108  CO2 '27 27 25 26  '$ --  24  GLUCOSE 96 97 117* 98  --  129*  BUN 24* '23 19 18  '$ --  16  CREATININE 1.24 1.28* 1.17 1.14  --  1.08  CALCIUM 9.1 9.2 8.7* 9.2  --  8.4*  MG 2.2  --   --  2.2  --  2.0   Liver Function Tests: No results for input(s): "AST", "  ALT", "ALKPHOS", "BILITOT", "PROT", "ALBUMIN" in the last 168 hours. CBG: Recent Labs  Lab 04/30/22 0617 04/30/22 0958 04/30/22 2110 05/01/22 0614 05/01/22 1148  GLUCAP 107* 112* 183* 127* 120*    Discharge time spent: 35 minutes.  Signed: Cordelia Poche, MD [facility] 05/01/2022

## 2022-05-01 NOTE — Progress Notes (Signed)
Inpatient Rehabilitation Admissions Coordinator   I have insurance approval and CIR bed to admit him to today. I met at bedside with patient , wife and son. They feel he is at his baseline as prior to surgery. I await medical clearance by Dr Nettey to pursue admit today. Acute team and TOC made aware. Wife and son in agreement to plan.   , RN, MSN Rehab Admissions Coordinator [phone] 05/01/2022 12:14 PM  

## 2022-05-01 NOTE — TOC Transition Note (Signed)
Transition of Care (TOC) - CM/SW Discharge Note Marvetta Gibbons RN, BSN Transitions of Care Unit 4E- RN Case Manager See Treatment Team for direct phone #    Patient Details  Name: Damon Walker MRN: [id number] Date of Birth: 06-Oct-1945  Transition [facility] Care [facility]) CM/SW Contact:  Dawayne Patricia, RN Phone Number: 05/01/2022, 3:01 PM   Clinical Narrative:    Pt s/p TCAR, CIR has bed available and insurance for admit today. MD has cleared pt for transition to [facility] today.  Pt to be transitioned later this afternoon.    Final next level of care: IP Rehab Facility Barriers to Discharge: No Barriers Identified   Patient Goals and CMS Choice Patient states their goals for this hospitalization and ongoing recovery are:: rehab CMS Medicare.gov Compare Post Acute Care list provided to:: Patient Choice offered to / list presented to : Patient, Adult Children  Discharge Placement                 [facility]      Discharge Plan and Services     Post Acute Care Choice: IP Rehab          DME Arranged: N/A DME Agency: NA       HH Arranged: NA HH Agency: NA        Social Determinants of Health (SDOH) Interventions     Readmission Risk Interventions     No data to display

## 2022-05-01 NOTE — Progress Notes (Signed)
Inpatient Rehabilitation Admissions Coordinator   I have been given clearance to admit to CIR today from Dr Lonny Prude. I will make the arrangements.  Danne Baxter, RN, MSN Rehab Admissions Coordinator [phone] 05/01/2022 3:04 PM

## 2022-05-01 NOTE — H&P (Signed)
Physical Medicine and Rehabilitation Admission H&P        Chief Complaint  Patient presents with   Functional deficits due to stroke.       HPI:  Damon Walker is a 76 year old RH-male with history of combined CHF, L-PCA CVA with right hemiopsia, HTN, HH/GERD, CAS     Review of Systems  Constitutional:  Negative for chills and fever.  HENT:  Positive for hearing loss (significant hearing loss).   Eyes:  Negative for blurred vision and double vision.  Respiratory:  Negative for cough and shortness of breath.   Cardiovascular:  Negative for chest pain and palpitations.  Gastrointestinal:  Negative for abdominal pain.  Musculoskeletal:  Positive for myalgias (BLE pain/weakness with difficulty waking.). Negative for back pain and joint pain.       Walking limited to 10 minutes and tends to hobble due to pain radiating down both legs.  Neurological:  Positive for weakness. Negative for tingling and sensory change.  Psychiatric/Behavioral:  The patient has insomnia (sleeps on and off during the day.).           Past Medical History:  Diagnosis Date   Carotid arterial disease (HCC) 06/2014; 01/09/2015    Followed by Dr. Arbie Cookey of [facility] -- a) Carotid u/s: 40-59% bilat ICA stenosis; b) 10/2019: Carotid Dopplers October 20, 2019: R ICA 1-39%, LICA 40-59%.  Right vertebral artery appears occluded.  Normal subclavian flow.  Normal left vertebral artery. 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  MRI Brain: Acute L Occipital Lobe Infarct. Chronic microvascular ischemic changes in the white matter and left pons.  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   Chronic combined systolic and diastolic congestive heart failure, NYHA class 2 (HCC) 05/11/2015 - 05/15/15     a. EF 20-25% with anterior-anteroseptal akinesis (immediately post anterior STEMI).  ; b. 10/10/'16 Echo: EF 35-40% with moderate mid-apical anteroseptal, anterior and apical hypokinesis.   Coronary artery disease involving native heart 05/11/2015    Cath: 3V CAD with 90% prox RCA, 80% mid RCA, 80% OM3, (RCA and OM residual treated medically) 100% LAD - PCI to Prox-Mid LAD with Overlapping Promus Premier DES 3.0 x 38 & 3.0 x 16 (post-dilated to 3.5 mm)    Essential hypertension     H/O: GI bleed 05/15/2015    s/p Sigmoid Polypectomy 05/04/2015 - prior to STEMI on May 11, 2015; Had GI Bleed following TPA for CVA, while on ASA & Brilinta; Flex Sig - 1. Internal Hemorrhoids, 2. Distal sigmoid polypectomy site with flat Whitebase status post biopsy and tattoo with 2mL spot over 3 injections 3. Proximal sigmoid polypectomy site seen as well, 4) otherwise normal with no evidence of bleed   Hyperlipidemia with target LDL less than 70     Ischemic cardiomyopathy 05/11/2015    EF 40% on cath 05/11/2015 after anterior STEMI, EF 20-25% on echo 05/13/2015; 12/2015 =  EF ~45%.   Liver disease, chronic, with cirrhosis (HCC) 11/2019    Mild possible early cirrhotic liver disease noted on CT scan.   Prediabetes Oct 2016    A1C 6.0 in Oct 2016   ST elevation myocardial infarction (STEMI) involving left anterior descending (LAD) coronary artery with complication (HCC) 05/11/2015    100%  Prox LAD - PCI with overlapping DES x 2   Tobacco abuse      a. 30 yrs - 1.5 ppd. - Quit 05/11/2015   Type 2 diabetes mellitus with other specified complication (HCC) 10/23/2021   Vision abnormalities      Right hemianopsia           Past Surgical History:  Procedure Laterality Date   CARDIAC CATHETERIZATION N/A 05/11/2015    Procedure: Left Heart Cath and Coronary Angiography;  Surgeon: Peter M Swaziland, MD;  Location: [facility] INVASIVE CV LAB;  Service: Cardiovascular;  100% pLAD (long lesion). RCA - prox 90%, mid 80%. OM2 & OM3 80%  (OM2 & 3 not necessarily PCI targets); EF 35-45% with Anterio HK   CARDIAC CATHETERIZATION N/A 05/11/2015    Procedure: Coronary Stent Intervention;  Surgeon: Peter M Swaziland, MD;  Location: [facility] INVASIVE CV LAB;  Service: Cardiovascular; PCI to Prox-Mid LAD with Overlapping Promus Premier DES 3.0 x 38 & 3.0 x 16 (post-dilated to 3.5 mm)     FLEXIBLE SIGMOIDOSCOPY N/A 05/18/2015    Procedure: FLEXIBLE SIGMOIDOSCOPY;  Surgeon: Vida Rigger, MD;  Location: [facility] ENDOSCOPY;  Service: Endoscopy;  Laterality: N/A;   NM MYOVIEW LTD   08/2016    EF 30% with large anterior-anteroseptal and apical infarct consistent with LAD infarct. His HIGH RISK because of large infarct. No ischemia.   S/P Appendectomy        Age 52   S/P Inguinal Hernia Repair        In his 20's.   TRANSTHORACIC ECHOCARDIOGRAM   05/15/2015    Mild LVH, EF 35-40% - Mod HK of mid-apical anteroseptal, anterior & apical walls.  Gr 1 DD. Mild bilateral Atrial Enlargement.   TRANSTHORACIC ECHOCARDIOGRAM   12/2015    EF up to ~40-45% wiht Anterior-Anteroapical HK.  Mod LV dilation with increased LVEDP.   TRANSTHORACIC ECHOCARDIOGRAM   12/'17; 4/'18    a) EF ~30-35% (in setting of HTN Urgency). - Gr 2 DD;; b) EF 30-35% (per Dr. Royann Shivers - 35-40%). Anterior-anteroapical Akinesis. Gr2 DD w/ high filling pressures. Mild LA dilation. Mild RV dilation.           Family History  Adopted: Yes  Problem Relation Age of Onset   Other Mother          eczema    Cancer Mother     Other Other          Adopted - unaware of biological parent's histories.      Social History: Married. Retired Firefighter. Per reports that he quit smoking about 7 years ago. His smoking use included cigarettes. He has a 30.00 pack-year smoking history. He has been exposed to tobacco smoke. He has never used smokeless tobacco. He reports that he does not drink alcohol and does not use drugs.           Medications Prior to Admission  Medication Sig Dispense Refill   carvedilol  (COREG) 12.5 MG tablet Take 1 tablet (12.5 mg total) by mouth 2 (two) times daily. 180 tablet 3   clopidogrel (PLAVIX) 75 MG tablet Take 1 tablet (75 mg total) by mouth daily. 30 tablet 1   famotidine (PEPCID) 40 MG tablet Take 40 mg by mouth every other day.       ferrous sulfate 325 (65 FE) MG tablet Take 325 mg by mouth daily with breakfast.       furosemide (LASIX) 20 MG tablet Take 1 tablet (20 mg total) by  mouth daily as needed. (Patient taking differently: Take 20 mg by mouth daily as needed for fluid or edema.) 30 tablet 6   losartan (COZAAR) 25 MG tablet TAKE 1 TABLET BY MOUTH ONCE DAILY AS NEEDED (Patient taking differently: Take 25 mg by mouth daily as needed (for BP higher than 130).) 20 tablet 6   nitroGLYCERIN (NITROSTAT) 0.4 MG SL tablet Place 1 tablet (0.4 mg total) under the tongue every 5 (five) minutes x 3 doses as needed for chest pain. 25 tablet 3   pantoprazole (PROTONIX) 20 MG tablet Take 20 mg by mouth every other day.       rosuvastatin (CRESTOR) 40 MG tablet Take 1 tablet (40 mg total) by mouth daily at 6 PM. 90 tablet 3          Home: Home Living Family/patient expects to be discharged to:: Private residence Living Arrangements: Spouse/significant other Available Help at Discharge: Family, Available 24 hours/day (wife can take FMLA as needed or retire) Type of Home: Apartment Home Access: Level entry Home Layout: One level Bathroom Shower/Tub: Engineer, manufacturing systems: Standard Bathroom Accessibility: Yes Home Equipment: Grab bars - tub/shower  Lives With: Spouse   Functional History: Prior Function Prior Level of Function : Independent/Modified Independent, Driving Mobility Comments: pt not active "at all" per wife, but is able to drive and run errands independently, no hx of falls. does spend most of each day on computer games. retired ADLs Comments: per wife, pt is independent. 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 Pt with good power up to full stand. Ambulation/Gait Ambulation/Gait assistance: Min assist Gait Distance (Feet): 300 Feet Assistive device: None Gait Pattern/deviations: Step-through pattern, Decreased stride length, Antalgic, Wide base of support General Gait Details: Min A at times to steady and mod cues for attending to R side.  Worked on finding objects on both sides -required increased time but was able to find and say room numbers, read caution sign, identify photo of trees on R and L.  RN spoke to pt on R side and he had hard time identifying but on way back to room she spoke to him from L and pt more attentive.  Pt not sure how to get back to his room after making a couple turns but once going in right direction pt looking in rooms on R and L until he saw his family. Gait velocity: Decreased Gait velocity interpretation: 1.31 - 2.62 ft/sec, indicative of limited community ambulator   ADL: ADL Overall ADL's : Needs assistance/impaired Eating/Feeding: NPO Grooming: Wash/dry hands, Wash/dry face, Min guard, Standing Upper Body Bathing: Supervision/ safety Lower Body Bathing: Minimal assistance, Cueing for safety, Sit to/from stand Upper Body Dressing : Moderate assistance Upper Body Dressing Details (indicate cue type and reason): Poor motor planning UB dressing Lower Body Dressing: Min guard, Minimal assistance, Sit to/from  stand Lower Body Dressing Details (indicate cue type and reason): donning LB dressing during session with min guard-min A for steadying Functional mobility during ADLs: Minimal assistance  General ADL Comments: Min A overall with poor attention impacting safety.   Cognition: Cognition Overall Cognitive Status: Impaired/Different from baseline Arousal/Alertness: Lethargic Orientation Level: Oriented to person Attention: Focused, Sustained Focused Attention: Impaired Focused Attention Impairment: Verbal basic Sustained Attention: Impaired Sustained Attention Impairment: Verbal basic, Functional basic Awareness: Impaired Awareness Impairment: Intellectual impairment, Emergent impairment, Anticipatory impairment Problem Solving: Impaired Problem Solving Impairment: Functional basic, Verbal basic Behaviors: Impulsive Safety/Judgment: Impaired Cognition Arousal/Alertness: Awake/alert Behavior During Therapy: Flat affect Overall Cognitive Status: Impaired/Different from baseline Area of Impairment: Orientation, Attention, Memory, Following commands, Safety/judgement, Awareness, Problem solving Orientation Level: Disoriented to, Place, Time, Situation Current Attention Level: Sustained Memory: Decreased short-term memory Following Commands: Follows one step commands consistently, Follows multi-step commands inconsistently (Could follow 2 step commands at times; some limitations due to hearing) Safety/Judgement: Decreased awareness of safety, Decreased awareness of deficits Awareness: Intellectual Problem Solving: Slow processing, Decreased initiation, Difficulty sequencing, Requires verbal cues, Requires tactile cues General Comments: Global aphasia. Following commands with visual demonstration due to pt being very hard of hearing and with some receptive difficulties. Pt requiring mod multimodal cues throughout session for attention to R side of environment Decreaed safety  awareness. Difficult to assess due to: Impaired communication   Physical Exam: Blood pressure (!) 116/52, pulse (!) 51, temperature (!) 97.5 F (36.4 C), temperature source Oral, resp. rate 19, height 5\' 8"  (1.727 m), weight 82.5 kg, SpO2 96 %. Physical Exam Vitals and nursing note reviewed.  Constitutional:      Appearance: Normal appearance.     Comments: HOH. Right inattention noted.   Pulmonary:     Effort: Pulmonary effort is normal.  Skin:    Comments: Right antecubital fossa with induration and pain at prior IV site. Surrounding erythema with min edema at elbow.   Neurological:     Comments: Exteremly HOH and looked to family for answers. He is able to follow simple motor commands with occasional perseveration.     General: No acute distress Mood and affect are appropriate Heart: Regular rate and rhythm no rubs murmurs or extra sounds Lungs: Clear to auscultation, breathing unlabored, no rales or wheezes Abdomen: Positive bowel sounds, soft nontender to palpation, nondistended Extremities: No clubbing, cyanosis, or edema Skin: No evidence of breakdown, no evidence of rash Aphasic but intact visual ID, is HOH, motor apraxia noted as well Neurologic: Cranial nerves II through XII intact, motor strength is 5/5 in left 4/5 Right deltoid, bicep, tricep, grip, hip flexor, knee extensors, ankle dorsiflexor and plantar flexor Sensory exam normal sensation to light touch and proprioception in bilateral upper and lower extremities  Musculoskeletal: Full range of motion in all 4 extremities. No joint swelling      Lab Results Last 48 Hours        Results for orders placed or performed during the hospital encounter of 04/21/22 (from the past 48 hour(s))  Glucose, capillary     Status: Abnormal    Collection Time: 04/29/22  4:52 PM  Result Value Ref Range    Glucose-Capillary 122 (H) 70 - 99 mg/dL      Comment: Glucose reference range applies only to samples taken after fasting for  at least 8 hours.  Glucose, capillary     Status: Abnormal    Collection Time: 04/29/22 10:07 PM  Result Value Ref Range    Glucose-Capillary 134 (H) 70 - 99 mg/dL      Comment: Glucose reference range applies only to samples taken after fasting for at least 8 hours.  Glucose, capillary     Status: Abnormal    Collection Time: 04/30/22  6:17 AM  Result Value Ref Range    Glucose-Capillary 107 (H) 70 - 99 mg/dL      Comment: Glucose reference range applies only to samples taken after fasting for at least 8 hours.  I-STAT 7, (LYTES, BLD GAS, ICA, H+H)     Status: Abnormal    Collection Time: 04/30/22  8:26 AM  Result Value Ref Range    pH, Arterial 7.376 7.35 - 7.45    pCO2 arterial 43.2 32 - 48 mmHg    pO2, Arterial 192 (H) 83 - 108 mmHg    Bicarbonate 25.8 20.0 - 28.0 mmol/L    TCO2 27 22 - 32 mmol/L    O2 Saturation 100 %    Acid-Base Excess 0.0 0.0 - 2.0 mmol/L    Sodium 142 135 - 145 mmol/L    Potassium 4.0 3.5 - 5.1 mmol/L    Calcium, Ion 1.21 1.15 - 1.40 mmol/L    HCT 34.0 (L) 39.0 - 52.0 %    Hemoglobin 11.6 (L) 13.0 - 17.0 g/dL    Patient temperature 35.0 C      Sample type ARTERIAL    POCT Activated clotting time     Status: None    Collection Time: 04/30/22  8:40 AM  Result Value Ref Range    Activated Clotting Time 251 seconds      Comment: Reference range 74-137 seconds for patients not on anticoagulant therapy.  POCT Activated clotting time     Status: None    Collection Time: 04/30/22  8:51 AM  Result Value Ref Range    Activated Clotting Time 245 seconds      Comment: Reference range 74-137 seconds for patients not on anticoagulant therapy.  Glucose, capillary     Status: Abnormal    Collection Time: 04/30/22  9:58 AM  Result Value Ref Range    Glucose-Capillary 112 (H) 70 - 99 mg/dL      Comment: Glucose reference range applies only to samples taken after fasting for at least 8 hours.  Glucose, capillary     Status: Abnormal    Collection Time: 04/30/22   9:10 PM  Result Value Ref Range    Glucose-Capillary 183 (H) 70 - 99 mg/dL      Comment: Glucose reference range applies only to samples taken after fasting for at least 8 hours.    Comment 1 Notify RN      Comment 2 Document in Chart    Glucose, capillary     Status: Abnormal    Collection Time: 05/01/22  6:14 AM  Result Value Ref Range    Glucose-Capillary 127 (H) 70 - 99 mg/dL      Comment: Glucose reference range applies only to samples taken after fasting for at least 8 hours.    Comment 1 Notify RN      Comment 2 Document in Chart    Basic metabolic panel     Status: Abnormal    Collection Time: 05/01/22  6:35 AM  Result Value Ref Range    Sodium 140 135 - 145 mmol/L    Potassium 3.7 3.5 - 5.1 mmol/L    Chloride 108 98 - 111 mmol/L    CO2 24 22 - 32 mmol/L    Glucose, Bld 129 (H) 70 - 99 mg/dL      Comment: Glucose reference range applies only to samples taken after fasting for at least  8 hours.    BUN 16 8 - 23 mg/dL    Creatinine, Ser 9.14 0.61 - 1.24 mg/dL    Calcium 8.4 (L) 8.9 - 10.3 mg/dL    GFR, Estimated >78 >29 mL/min      Comment: (NOTE) Calculated using the CKD-EPI Creatinine Equation (2021)      Anion gap 8 5 - 15      Comment: Performed at [facility], [address] [place], [place] [address]  CBC     Status: Abnormal    Collection Time: 05/01/22  6:35 AM  Result Value Ref Range    WBC 12.1 (H) 4.0 - 10.5 K/uL    RBC 3.81 (L) 4.22 - 5.81 MIL/uL    Hemoglobin 11.8 (L) 13.0 - 17.0 g/dL    HCT 08.6 (L) 57.8 - 52.0 %    MCV 90.0 80.0 - 100.0 fL    MCH 31.0 26.0 - 34.0 pg    MCHC 34.4 30.0 - 36.0 g/dL    RDW 46.9 62.9 - 52.8 %    Platelets 210 150 - 400 K/uL    nRBC 0.0 0.0 - 0.2 %      Comment: Performed at [facility], [address] [place], [place] [address]  Magnesium     Status: None    Collection Time: 05/01/22  6:35 AM  Result Value Ref Range    Magnesium 2.0 1.7 - 2.4 mg/dL      Comment: Performed at [facility],  [address], [place], [place] [address]  Lipid panel     Status: Abnormal    Collection Time: 05/01/22  6:35 AM  Result Value Ref Range    Cholesterol 86 0 - 200 mg/dL    Triglycerides 725 <366 mg/dL    HDL 22 (L) >44 mg/dL    Total CHOL/HDL Ratio 3.9 RATIO    VLDL 21 0 - 40 mg/dL    LDL Cholesterol 43 0 - 99 mg/dL      Comment:        Total Cholesterol/HDL:CHD Risk Coronary Heart Disease Risk Table                     Men   Women  1/2 Average Risk   3.4   3.3  Average Risk       5.0   4.4  2 X Average Risk   9.6   7.1  3 X Average Risk  23.4   11.0        Use the calculated Patient Ratio above and the CHD Risk Table to determine the patient's CHD Risk.        ATP III CLASSIFICATION (LDL):  <100     mg/dL   Optimal  034-742  mg/dL   Near or Above                    Optimal  130-159  mg/dL   Borderline  595-638  mg/dL   High  >756     mg/dL   Very High Performed at [facility], [address], [place], [place] [address]    Glucose, capillary     Status: Abnormal    Collection Time: 05/01/22 11:48 AM  Result Value Ref Range    Glucose-Capillary 120 (H) 70 - 99 mg/dL      Comment: Glucose reference range applies only to samples taken after fasting for at least 8 hours.  Imaging Results (Last 48 hours)  DG C-Arm 1-60 Min-No Report   Result Date: 04/30/2022 Fluoroscopy was utilized by the requesting physician.  No radiographic interpretation.          Blood pressure (!) 116/52, pulse (!) 51, temperature (!) 97.5 F (36.4 C), temperature source Oral, resp. rate 19, height 5\' 8"  (1.727 m), weight 82.5 kg, SpO2 96 %.   Medical Problem List and Plan: 1. Functional deficits secondary to Left parietal infarct with aphasia and RIght hemiparesis             -patient may  shower             -ELOS/Goals:  2.  Antithrombotics: -DVT/anticoagulation:  Pharmaceutical: Lovenox             -antiplatelet therapy: ASA d/c today and to continue Plavix.  3. Pain Management:  Oxycodone prn.  4. Mood/Behavior/Sleep: LCSW to follow for evaluation and support.              -antipsychotic agents: Seroquel prn for agitation.              --Per family--gets up at  3 am then sleeps till late am and off/on in daytime.   5. Neuropsych/cognition: This patient is not fully capable of making decisions on his own behalf. 6. Skin/Wound Care: Routine pressure relief measures.  7. Fluids/Electrolytes/Nutrition: Monitor I/O. Check CMET in am.  8. 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  9. Delirium: Resolving. Continue Seroquel prn as really sedated him --hx confusion with last stroke.  10. Chronic combined CHF: Monitor I/O and check wts daily.  --Monitor for signs of overload.             --Continue Coreg, Plavix and Crestor.              --add Cardiac restrictions to diet.  11. CKD III: Monitor with serial checks. Improved with continuous fluids on board.             --d/c fluids to avoid overload. CXR with mild pulmonary edema pre-op.   12. HTN: Monitor BP TID--on coreg.  13. Pre-diabetes: Hgb A1C-5.8. Add CM restrictions to diet.              --RD consult for dietary education.  14. HH/GERD: Managed --alternates  Protonix with Pepcid every other day.  15. Cellulitis Right forearm: Secondary to infiltrated IV--started 2-3 days ago per wife. --Will start warm compresses and doxycycline for treatment.       Jacquelynn Cree, PA-C 05/01/2022  "I have personally performed a face to face diagnostic evaluation of this patient.  Additionally, I have reviewed and concur with the physician assistant's documentation above." Erick Colace M.D. [facility] Fellow Am Acad of [employer] Med and Rehab Diplomate Am Board of Electrodiagnostic Med Fellow Am Board of Interventional Pain

## 2022-05-01 NOTE — Hospital Course (Signed)
Damon Walker is a 76 y.o. male with a history of CAD s/p PCI and stent placement, CVA, combined heart failure, carotid artery stenosis, hyperlipidemia, diabetes mellitus type 2, CKD stage IIIa. Patient presented secondary to speech difficulty and confusion. Code stroke activated and CT head without acute findings. MRI significant for acute CVA involving the left parietal lobe and CTA head neck was significant for perfusion abnormality in the right temporal/parietal lobes. Left ICA stenosis noted and vascular surgery was consulted for TCAR which was performed on 9/26. Recommendation for dual antiplatelet therapy. PT/Ot recommending acute inpatient rehabilitation on discharge.

## 2022-05-01 NOTE — Progress Notes (Signed)
   VASCULAR SURGERY ASSESSMENT & PLAN:   POD 1 S/P LEFT TCAR: His neurologic exam is at baseline.  His incision looks fine.  He is on aspirin, Plavix, and a statin.  He was on Plavix prior to admission so can stay on that indefinitely.   SUBJECTIVE:   No complaints this morning.  PHYSICAL EXAM:   Vitals:   05/01/22 0000 05/01/22 0200 05/01/22 0300 05/01/22 0400  BP: (!) 95/36 (!) 113/45 (!) 112/27 (!) 111/49  Pulse: (!) 54 (!) 53 (!) 48 (!) 51  Resp: '20 20 14 20  '$ Temp: 98.3 F (36.8 C) 98.3 F (36.8 C) 98.3 F (36.8 C) 98.5 F (36.9 C)  TempSrc: Oral Oral Oral Oral  SpO2: 92% 94% 96% 95%  Weight:      Height:       Good strength in the upper extremities and lower extremities. His speech continues to gradually improve.  LABS:   Lab Results  Component Value Date   WBC 12.1 (H) 05/01/2022   HGB 11.8 (L) 05/01/2022   HCT 34.3 (L) 05/01/2022   MCV 90.0 05/01/2022   PLT 210 05/01/2022   Lab Results  Component Value Date   CREATININE 1.14 04/29/2022   CBG (last 3)  Recent Labs    04/30/22 0958 04/30/22 2110 05/01/22 0614  GLUCAP 112* 183* 127*    PROBLEM LIST:    Principal Problem:   Acute CVA (cerebrovascular accident) ([place]) Active Problems:   Essential hypertension   Hyperlipidemia with target LDL less than 70   Carotid arterial disease (HCC)   CAD-native artery   CKD (chronic kidney disease), stage III ([place])   Type 2 diabetes mellitus with other specified complication (HCC)   Combined systolic and diastolic heart failure (HCC)   Acute respiratory failure with hypoxia and hypercapnia (HCC)   Hypokalemia   CURRENT MEDS:    aspirin  300 mg Rectal Daily   Or   aspirin EC  81 mg Oral Daily   carvedilol  12.5 mg Oral BID WC   Chlorhexidine Gluconate Cloth  6 each Topical Daily   clopidogrel  75 mg Oral Daily   docusate sodium  100 mg Oral Daily   famotidine  40 mg Oral QODAY   ferrous sulfate  325 mg Oral Q breakfast   heparin  5,000 Units  Subcutaneous Q8H   insulin aspart  0-9 Units Subcutaneous TID WC   mouth rinse  15 mL Mouth Rinse 4 times per day   pantoprazole  20 mg Oral QODAY   pantoprazole  40 mg Oral Daily   rosuvastatin  40 mg Oral q1800   sertraline  12.5 mg Oral QHS    Deitra Mayo Office: [phone] 05/01/2022

## 2022-05-01 NOTE — Progress Notes (Signed)
Kirsteins, Luanna Salk, MD  Physician Other PMR Pre-admission    Signed Date of Service:  04/29/2022  1:46 PM  Related encounter: ED to Hosp-Admission (Discharged) from 04/21/2022 in [facility] 4E CV [place]   Signed      PMR Admission Coordinator Pre-Admission Assessment   Patient: Damon Walker is an 76 y.o., male MRN: [id number] DOB: 04/09/1946 Height: '5\' 8"'  (172.7 cm) Weight: 82.5 kg   Insurance Information HMO: yes    PPO:      PCP:      IPA:      80/20:      OTHER:  PRIMARY: Blue Medicare      Policy#: [id number]      Subscriber: pt CM Name: Shanon      Phone#: [phone]     Fax#: [phone] Pre-Cert#: [id number]  approved for 7 days from admit    Employer:  Benefits:  Phone #: [phone]     Name: 9/25 Eff. Date: 08/05/21     Deduct: none      Out of Pocket Max: $3900      Life Max: none CIR: $295 co pay per day days 1 until 6      SNF: no copay days 1 until 20; $196 co pay per day days 21 until 60; no c copay days 61 until 100 Outpatient: $25 per visit     Co-Pay: visits per medical neccesity Home Health: 100%      Co-Pay: visit per medical neccesity DME: 80%     Co-Pay: 20% Providers: in network  SECONDARY: none   Financial Counselor:       Phone#:    [employer]" for patients in Inpatient Rehabilitation Facilities with attached "Privacy Act [place] Records" was provided and verbally reviewed with: Patient and Family   Emergency Contact Information Contact Information       Name Relation Home Work Mobile    Salceda,Valerie Spouse [phone]   [phone]    Brownie, Nehme     [phone]         Current Medical History  Patient Admitting Diagnosis: CVA   History of Present Illness: 76 year old male with history of carotid artery disease, CVA, combined systolic and diastolic CHF, CAD, HTN, HLD, T2 DM, CKD stage 3 . He presented on 04/21/22 with difficulty with speech and some confusion.    Imaging  revealed acute ischemic L MCA infarct. On 9/18 rapid response was called and patient transferred to ICU and placed on BIPAP with improvement of respiratory status. CTA head and neck 50 % diameter stenosis proximal left internal carotid artery. Diminutive right vertebral artery which appeared diffusely diseased and with minimal contribution to the basilar. 2 d echo with LVEF 30 to 35 %. Left ICA 40-59% stenosis. Recommend 30 day monitor to rule out afib as an outpatient. On Plavix pta. Now placed on ASA and Plavix.    Home meds of Coreg, Losartan and Lasix . Has declined AID in the past. TCAR 9/26 with Dr Scot Dock. Crestor resumed with LDL 69. Current smoker with counseling for cessation. Depression noted and started on Zoloft.       Complete NIHSS TOTAL: 4   Patient's medical record from [facility] has been reviewed by the rehabilitation admission coordinator and physician.   Past Medical History      Past Medical History:  Diagnosis Date   Carotid arterial disease ([place]) 06/2014; 01/09/2015    Followed by Dr. Donnetta Hutching of [facility] --  a) Carotid u/s: 40-59% bilat ICA stenosis; b) 10/2019: Carotid Dopplers October 19, 6965: R ICA 8-93%, LICA 81-01%.  Right vertebral artery appears occluded.  Normal subclavian flow.  Normal left vertebral artery. -->  No change since 2020   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 ([place]) 05/14/2015    -- Given TPA.  MRI Brain: Acute L Occipital Lobe Infarct. Chronic microvascular ischemic changes in the white matter and left pons.  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   Chronic combined systolic and diastolic congestive heart failure, NYHA class 2 ([place]) 05/11/2015 - 05/15/15    a. EF 20-25% with anterior-anteroseptal akinesis (immediately post anterior STEMI).  ; b. 10/10/'16 Echo: EF  35-40% with moderate mid-apical anteroseptal, anterior and apical hypokinesis.   Coronary artery disease involving native heart 05/11/2015    Cath: 3V CAD with 90% prox RCA, 80% mid RCA, 80% OM3, (RCA and OM residual treated medically) 100% LAD - PCI to Prox-Mid LAD with Overlapping Promus Premier DES 3.0 x 38 & 3.0 x 16 (post-dilated to 3.5 mm)    Essential hypertension     H/O: GI bleed 05/15/2015    s/p Sigmoid Polypectomy 05/04/2015 - prior to STEMI on May 11, 2015; Had GI Bleed following TPA for CVA, while on ASA & Brilinta; Flex Sig - 1. Internal Hemorrhoids, 2. Distal sigmoid polypectomy site with flat White[place] status post biopsy and tattoo with 35m spot over 3 injections 3. Proximal sigmoid polypectomy site seen as well, 4) otherwise normal with no evidence of bleed   Hyperlipidemia with target LDL less than 70     Ischemic cardiomyopathy 05/11/2015    EF 40% on cath 05/11/2015 after anterior STEMI, EF 20-25% on echo 05/13/2015; 12/2015 =  EF ~45%.   Liver disease, chronic, with cirrhosis ([place] 11/2019    Mild possible early cirrhotic liver disease noted on CT scan.   Prediabetes Oct 2016    A1C 6.0 in Oct 2016   ST elevation myocardial infarction (STEMI) involving left anterior descending (LAD) coronary artery with complication ([place] 175/08/256   100% Prox LAD - PCI with overlapping DES x 2   Tobacco abuse      a. 30 yrs - 1.5 ppd. - Quit 05/11/2015   Type 2 diabetes mellitus with other specified complication ([place] 35/27/7824  Vision abnormalities      Right hemianopsia    Has the patient had major surgery during 100 days prior to admission? No   Family History   family history includes Cancer in his mother; Other in his mother and another family member. He was adopted.   Current Medications   Current Facility-Administered Medications:    0.9 %  sodium chloride infusion, 500 mL, Intravenous, Once PRN, DAngelia Mould MD   acetaminophen (TYLENOL) tablet 650 mg, 650 mg, Oral,  Q4H PRN **OR** acetaminophen (TYLENOL) 160 MG/5ML solution 650 mg, 650 mg, Per Tube, Q4H PRN **OR** acetaminophen (TYLENOL) suppository 650 mg, 650 mg, Rectal, Q4H PRN, Baglia, Corrina, PA-C   acetaminophen (TYLENOL) tablet 325-650 mg, 325-650 mg, Oral, Q4H PRN **OR** acetaminophen (TYLENOL) suppository 325-650 mg, 325-650 mg, Rectal, Q4H PRN, DAngelia Mould MD   alum & mag hydroxide-simeth (MAALOX/MYLANTA) 200-200-20 MG/5ML suspension 15-30 mL, 15-30 mL, Oral, Q2H PRN, DAngelia Mould MD   carvedilol (COREG) tablet 12.5 mg, 12.5 mg, Oral, BID WC,  Baglia, Corrina, PA-C, 12.5 mg at 05/01/22 1478   Chlorhexidine Gluconate Cloth 2 % PADS 6 each, 6 each, Topical, Daily, Baglia, Corrina, PA-C, 6 each at 04/29/22 0935   clopidogrel (PLAVIX) tablet 75 mg, 75 mg, Oral, Daily, Baglia, Corrina, PA-C, 75 mg at 05/01/22 0903   dextrose 5 % and 0.45 % NaCl with KCl 20 mEq/L infusion, , Intravenous, Continuous, Angelia Mould, MD, Last Rate: 75 mL/hr at 05/01/22 0646, New Bag at 05/01/22 0646   docusate sodium (COLACE) capsule 100 mg, 100 mg, Oral, Daily, Angelia Mould, MD, 100 mg at 05/01/22 0903   famotidine (PEPCID) tablet 40 mg, 40 mg, Oral, QODAY, Baglia, Corrina, PA-C, 40 mg at 05/01/22 0903   ferrous sulfate tablet 325 mg, 325 mg, Oral, Q breakfast, Baglia, Corrina, PA-C, 325 mg at 05/01/22 0903   guaiFENesin-dextromethorphan (ROBITUSSIN DM) 100-10 MG/5ML syrup 15 mL, 15 mL, Oral, Q4H PRN, Angelia Mould, MD   heparin injection 5,000 Units, 5,000 Units, Subcutaneous, Q8H, Angelia Mould, MD, 5,000 Units at 05/01/22 1438   hydrALAZINE (APRESOLINE) injection 5 mg, 5 mg, Intravenous, Q20 Min PRN, Angelia Mould, MD   insulin aspart (novoLOG) injection 0-9 Units, 0-9 Units, Subcutaneous, TID WC, Baglia, Corrina, PA-C, 1 Units at 05/01/22 0634   labetalol (NORMODYNE) injection 10 mg, 10 mg, Intravenous, Q10 min PRN, Angelia Mould, MD   magnesium  sulfate IVPB 2 g 50 mL, 2 g, Intravenous, Daily PRN, Angelia Mould, MD   metoprolol tartrate (LOPRESSOR) injection 2-5 mg, 2-5 mg, Intravenous, Q2H PRN, Angelia Mould, MD   ondansetron [facility]) injection 4 mg, 4 mg, Intravenous, Q6H PRN, Baglia, Corrina, PA-C, 4 mg at 04/26/22 2229   ondansetron (ZOFRAN) injection 4 mg, 4 mg, Intravenous, Q6H PRN, Angelia Mould, MD   Oral care mouth rinse, 15 mL, Mouth Rinse, 4 times per day, Baglia, Corrina, PA-C, 15 mL at 04/30/22 2148   oxyCODONE-acetaminophen (PERCOCET/ROXICET) 5-325 MG per tablet 1-2 tablet, 1-2 tablet, Oral, Q4H PRN, Angelia Mould, MD   pantoprazole (PROTONIX) EC tablet 20 mg, 20 mg, Oral, QODAY, Baglia, Corrina, PA-C, 20 mg at 05/01/22 0903   phenol (CHLORASEPTIC) mouth spray 1 spray, 1 spray, Mouth/Throat, PRN, Angelia Mould, MD   potassium chloride SA (KLOR-CON M) CR tablet 20-40 mEq, 20-40 mEq, Oral, Daily PRN, Angelia Mould, MD   QUEtiapine (SEROQUEL) tablet 12.5 mg, 12.5 mg, Oral, Daily PRN, Baglia, Corrina, PA-C   rosuvastatin (CRESTOR) tablet 40 mg, 40 mg, Oral, q1800, Baglia, Corrina, PA-C, 40 mg at 04/30/22 1827   senna-docusate (Senokot-S) tablet 1 tablet, 1 tablet, Oral, QHS PRN, Baglia, Corrina, PA-C   [COMPLETED] sertraline (ZOLOFT) tablet 12.5 mg, 12.5 mg, Oral, Daily, 12.5 mg at 04/25/22 1639 **FOLLOWED BY** sertraline (ZOLOFT) tablet 12.5 mg, 12.5 mg, Oral, QHS, Baglia, Corrina, PA-C, 12.5 mg at 04/30/22 2148   Patients Current Diet:  Diet Order                  Diet regular Room service appropriate? Yes; Fluid consistency: Thin  Diet effective now                       Precautions / Restrictions Precautions Precautions: Fall Precaution Comments: HOH - hearing aides in room Restrictions Weight Bearing Restrictions: No    Has the patient had 2 or more falls or a fall with injury in the past year? No   Prior Activity Level Limited Community (1-2x/wk): not  acitve,  drives, spends alot of time on computer games   Prior Functional Level Self Care: Did the patient need help bathing, dressing, using the toilet or eating? Independent   Indoor Mobility: Did the patient need assistance with walking from room to room (with or without device)? Independent   Stairs: Did the patient need assistance with internal or external stairs (with or without device)? Independent   Functional Cognition: Did the patient need help planning regular tasks such as shopping or remembering to take medications? Independent   Patient Information Are you of Hispanic, Latino/a,or Spanish origin?: A. No, not of Hispanic, Latino/a, or Spanish origin What is your race?: A. White Do you need or want an interpreter to communicate with a doctor or health care staff?: 9. Unable to respond   Patient's Response To:  Health Literacy and Transportation Is the patient able to respond to health literacy and transportation needs?: No Health Literacy - How often do you need to have someone help you when you read instructions, pamphlets, or other written material from your doctor or pharmacy?: Patient unable to respond In the past 12 months, has lack of transportation kept you from medical appointments or from getting medications?: No In the past 12 months, has lack of transportation kept you from meetings, work, or from getting things needed for daily living?: No   Home Assistive Devices / Equipment Home Equipment: Grab bars - tub/shower   Prior Device Use: Indicate devices/aids used by the patient prior to current illness, exacerbation or injury? None of the above   Current Functional Level Cognition   Arousal/Alertness: Lethargic Overall Cognitive Status: Impaired/Different from baseline Difficult to assess due to: Impaired communication Current Attention Level: Sustained Orientation Level: Oriented to person Following Commands: Follows one step commands consistently, Follows  multi-step commands inconsistently (Could follow 2 step commands at times; some limitations due to hearing) Safety/Judgement: Decreased awareness of safety, Decreased awareness of deficits General Comments: Global aphasia. Following commands with visual demonstration due to pt being very hard of hearing and with some receptive difficulties. Pt requiring mod multimodal cues throughout session for attention to R side of environment Decreaed safety awareness. Attention: Focused, Sustained Focused Attention: Impaired Focused Attention Impairment: Verbal basic Sustained Attention: Impaired Sustained Attention Impairment: Verbal basic, Functional basic Awareness: Impaired Awareness Impairment: Intellectual impairment, Emergent impairment, Anticipatory impairment Problem Solving: Impaired Problem Solving Impairment: Functional basic, Verbal basic Behaviors: Impulsive Safety/Judgment: Impaired    Extremity Assessment (includes Sensation/Coordination)   Upper Extremity Assessment: Generalized weakness RUE Deficits / Details: Able to perform toileting and LB ADLs in standing without cueing to integrate BUE RUE Coordination: decreased fine motor, decreased gross motor LUE Deficits / Details: does not move to command, PROM is WFL, some grimacing with overhead movement. Pt able to move arm against gravity. Difficult to assess due to LOA LUE Coordination: decreased fine motor, decreased gross motor  Lower Extremity Assessment: Defer to PT evaluation     ADLs   Overall ADL's : Needs assistance/impaired Eating/Feeding: NPO Grooming: Wash/dry hands, Wash/dry face, Min guard, Standing Upper Body Bathing: Supervision/ safety Lower Body Bathing: Minimal assistance, Cueing for safety, Sit to/from stand Upper Body Dressing : Moderate assistance Upper Body Dressing Details (indicate cue type and reason): Poor motor planning UB dressing Lower Body Dressing: Min guard, Minimal assistance, Sit to/from  stand Lower Body Dressing Details (indicate cue type and reason): donning LB dressing during session with min guard-min A for steadying Functional mobility during ADLs: Minimal assistance General ADL Comments: Min A overall  with poor attention impacting safety.     Mobility   Overal bed mobility: Needs Assistance Bed Mobility: Supine to Sit Sidelying to sit: Min assist Supine to sit: Supervision Sit to supine: Supervision General bed mobility comments: Supervision but increased time and mod cues     Transfers   Overall transfer level: Needs assistance Equipment used: None Transfers: Sit to/from Stand Sit to Stand: Min guard Bed to/from chair/wheelchair/BSC transfer type:: Stand pivot Stand pivot transfers: Min assist General transfer comment: Light assist for balance recovery. Pt with good power up to full stand.     Ambulation / Gait / Stairs / Wheelchair Mobility   Ambulation/Gait Ambulation/Gait assistance: Herbalist (Feet): 300 Feet Assistive device: None Gait Pattern/deviations: Step-through pattern, Decreased stride length, Antalgic, Wide base of support General Gait Details: Min A at times to steady and mod cues for attending to R side.  Worked on finding objects on both sides -required increased time but was able to find and say room numbers, read caution sign, identify photo of trees on R and L.  RN spoke to pt on R side and he had hard time identifying but on way back to room she spoke to him from L and pt more attentive.  Pt not sure how to get back to his room after making a couple turns but once going in right direction pt looking in rooms on R and L until he saw his family. Gait velocity: Decreased Gait velocity interpretation: 1.31 - 2.62 ft/sec, indicative of limited community ambulator     Posture / Balance Dynamic Sitting Balance Sitting balance - Comments: Min guard for LB dressing sitting on edge of recliner Balance Overall balance assessment:  Needs assistance Sitting-balance support: No upper extremity supported, Feet supported Sitting balance-Leahy Scale: Good Sitting balance - Comments: Min guard for LB dressing sitting on edge of recliner Postural control: Posterior lean Standing balance support: No upper extremity supported Standing balance-Leahy Scale: Fair Standing balance comment: Ambulated with occasional min A but no RW High Level Balance Comments: Worked on scanning environment with ambulation.  Once in room reaching task in all direction (increased time to find objects on R), turned in circle, picked item for floor all with min guard.  No difficulty standing feet together EO and ECl     Special needs/care consideration      Previous Home Environment  Living Arrangements: Spouse/significant other  Lives With: Spouse Available Help at Discharge: Family, Available 24 hours/day (wife can take FMLA as needed or retire) Type of Home: Apartment Home Layout: One level Home Access: Level entry Bathroom Shower/Tub: Chiropodist: Standard Bathroom Accessibility: Yes How Accessible: Accessible via walker Home Care Services: No   Discharge Living Setting Plans for Discharge Living Setting: Lives with (comment), Apartment Type of Home at Discharge: Apartment Discharge Home Layout: One level Discharge Home Access: Level entry Discharge Bathroom Shower/Tub: Tub/shower unit Discharge Bathroom Toilet: Standard Discharge Bathroom Accessibility: Yes How Accessible: Accessible via walker Does the patient have any problems obtaining your medications?: No   Social/Family/Support Systems Patient Roles: Spouse Contact Information: wife, Mateo Flow Anticipated Caregiver: wife Anticipated Ambulance person Information: see contacts Ability/Limitations of Caregiver: wife works but can take [employer] or reitre as needed Caregiver Availability: 24/7 Discharge Plan Discussed with Primary Caregiver: Yes Is Caregiver In  Agreement with Plan?: Yes Does Caregiver/Family have Issues with Lodging/Transportation while Pt is in Rehab?: No   Goals Patient/Family Goal for Rehab: Mod I to supervision with PT, OT  and SLP Expected length of stay: ELOS 7 to 10 days Pt/Family Agrees to Admission and willing to participate: Yes Program Orientation Provided & Reviewed with Pt/Caregiver Including Roles  & Responsibilities: Yes   Decrease burden of Care through IP rehab admission: n/a   Possible need for SNF placement upon discharge: not anticipated   Patient Condition: I have reviewed medical records from [facility], spoken with CM, and patient and spouse. I met with patient at the bedside for inpatient rehabilitation assessment.  Patient will benefit from ongoing PT, OT, and SLP, can actively participate in 3 hours of therapy a day 5 days of the week, and can make measurable gains during the admission.  Patient will also benefit from the coordinated team approach during an Inpatient Acute Rehabilitation admission.  The patient will receive intensive therapy as well as Rehabilitation physician, nursing, social worker, and care management interventions.  Due to bladder management, bowel management, safety, skin/wound care, disease management, medication administration, pain management, and patient education the patient requires 24 hour a day rehabilitation nursing.  The patient is currently min assist overall with mobility and basic ADLs.  Discharge setting and therapy post discharge at home with home health is anticipated.  Patient has agreed to participate in the Acute Inpatient Rehabilitation Program and will admit today.   Preadmission Screen Completed By:  Cleatrice Burke, 05/01/2022 2:57 PM ______________________________________________________________________   Discussed status with Dr. Letta Pate on 05/01/22 at 1458 and received approval for admission today.   Admission Coordinator:  Cleatrice Burke,  RN, time 5300 Date 05/01/22    Assessment/Plan: Diagnosis:  Left Parietal infarct Does the need for close, 24 hr/day Medical supervision in concert with the patient's rehab needs make it unreasonable for this patient to be served in a less intensive setting? Yes Co-Morbidities requiring supervision/potential complications: CKD3, DM Due to bladder management, bowel management, safety, skin/wound care, disease management, medication administration, pain management, and patient education, does the patient require 24 hr/day rehab nursing? Yes Does the patient require coordinated care of a physician, rehab nurse, PT, OT, and SLP to address physical and functional deficits in the context of the above medical diagnosis(es)? Yes Addressing deficits in the following areas: balance, endurance, locomotion, strength, transferring, bowel/bladder control, bathing, dressing, toileting, cognition, speech, language, swallowing, and psychosocial support Can the patient actively participate in an intensive therapy program of at least 3 hrs of therapy 5 days a week? Yes The potential for patient to make measurable gains while on inpatient rehab is good Anticipated functional outcomes upon discharge from inpatient rehab: supervision PT, supervision OT, supervision SLP Estimated rehab length of stay to reach the above functional goals is: 7-10d Anticipated discharge destination: Home 10. Overall Rehab/Functional Prognosis: good     MD Signature: Charlett Blake M.D. [facility] Fellow Am Acad of Phys Med and [place] Board of Electrodiagnostic Med Fellow Am Board of Interventional Pain          Revision History                                Note Details  Author Charlett Blake, MD File Time 05/01/2022  3:05 PM  Author Type Physician Status Signed  Last Editor Charlett Blake, [place] # [id number] [place] Date 05/01/2022

## 2022-05-01 NOTE — Progress Notes (Signed)
Inpatient Rehabilitation Admission Medication Review by a Pharmacist  A complete drug regimen review was completed for this patient to identify any potential clinically significant medication issues.  High Risk Drug Classes Is patient taking? Indication by Medication  Antipsychotic No Compazine- prn nausea  Anticoagulant Yes Lovenox- VTE prophylaxis  Antibiotic Yes Doxycycline- cellulitis R forearm  Opioid Yes Oxycodone/ APAP- prn pain  Antiplatelet Yes Plavix- antiplatelet  Hypoglycemics/insulin No   Vasoactive Medication No   Chemotherapy No   Other Yes Coreg -  CHF Crestor- HLD Ferrous Sulfate- iron supplement  Protonix, Pepcid- GERD Seroquel- prn agitation, delirium Trazodone- prn sleep     Type of Medication Issue Identified Description of Issue Recommendation(s)  Drug Interaction(s) (clinically significant)     Duplicate Therapy     Allergy     No Medication Administration End Date     Incorrect Dose     Additional Drug Therapy Needed     Significant med changes from prior encounter (inform family/care partners about these prior to discharge). Lasix 20 mg daily PRN fluid, edema Restart PTA meds when and if necessary during CIR admission or at time of discharge, if warranted   Other       Clinically significant medication issues were identified that warrant physician communication and completion of prescribed/recommended actions by midnight of the next day:  No  Name of provider notified for urgent issues identified:   Provider Method of Notification:    Pharmacist comments:   Time spent performing this drug regimen review (minutes):  [place], PharmD  Clinical Pharmacist 05/01/2022 6:22 PM

## 2022-05-02 ENCOUNTER — Encounter (HOSPITAL_COMMUNITY): Payer: Self-pay | Admitting: Vascular Surgery

## 2022-05-02 DIAGNOSIS — I63512 Cerebral infarction due to unspecified occlusion or stenosis of left middle cerebral artery: Secondary | ICD-10-CM

## 2022-05-02 DIAGNOSIS — G479 Sleep disorder, unspecified: Secondary | ICD-10-CM

## 2022-05-02 DIAGNOSIS — I5042 Chronic combined systolic (congestive) and diastolic (congestive) heart failure: Secondary | ICD-10-CM

## 2022-05-02 DIAGNOSIS — I10 Essential (primary) hypertension: Secondary | ICD-10-CM

## 2022-05-02 DIAGNOSIS — R4701 Aphasia: Secondary | ICD-10-CM

## 2022-05-02 LAB — CBC WITH DIFFERENTIAL/PLATELET
Abs Immature Granulocytes: 0.04 10*3/uL (ref 0.00–0.07)
Basophils Absolute: 0.1 10*3/uL (ref 0.0–0.1)
Basophils Relative: 1 %
Eosinophils Absolute: 0.3 10*3/uL (ref 0.0–0.5)
Eosinophils Relative: 4 %
HCT: 35.1 % — ABNORMAL LOW (ref 39.0–52.0)
Hemoglobin: 12 g/dL — ABNORMAL LOW (ref 13.0–17.0)
Immature Granulocytes: 1 %
Lymphocytes Relative: 30 %
Lymphs Abs: 2.4 10*3/uL (ref 0.7–4.0)
MCH: 30.8 pg (ref 26.0–34.0)
MCHC: 34.2 g/dL (ref 30.0–36.0)
MCV: 90.2 fL (ref 80.0–100.0)
Monocytes Absolute: 0.8 10*3/uL (ref 0.1–1.0)
Monocytes Relative: 10 %
Neutro Abs: 4.3 10*3/uL (ref 1.7–7.7)
Neutrophils Relative %: 54 %
Platelets: 197 10*3/uL (ref 150–400)
RBC: 3.89 MIL/uL — ABNORMAL LOW (ref 4.22–5.81)
RDW: 12.4 % (ref 11.5–15.5)
WBC: 7.9 10*3/uL (ref 4.0–10.5)
nRBC: 0 % (ref 0.0–0.2)

## 2022-05-02 LAB — COMPREHENSIVE METABOLIC PANEL
ALT: 17 U/L (ref 0–44)
AST: 23 U/L (ref 15–41)
Albumin: 2.6 g/dL — ABNORMAL LOW (ref 3.5–5.0)
Alkaline Phosphatase: 34 U/L — ABNORMAL LOW (ref 38–126)
Anion gap: 5 (ref 5–15)
BUN: 13 mg/dL (ref 8–23)
CO2: 24 mmol/L (ref 22–32)
Calcium: 8.3 mg/dL — ABNORMAL LOW (ref 8.9–10.3)
Chloride: 110 mmol/L (ref 98–111)
Creatinine, Ser: 1.01 mg/dL (ref 0.61–1.24)
GFR, Estimated: >60 mL/min (ref >60)
Glucose, Bld: 96 mg/dL (ref 70–99)
Potassium: 4.1 mmol/L (ref 3.5–5.1)
Sodium: 139 mmol/L (ref 135–145)
Total Bilirubin: 0.6 mg/dL (ref 0.3–1.2)
Total Protein: 5.9 g/dL — ABNORMAL LOW (ref 6.5–8.1)

## 2022-05-02 MED ORDER — SERTRALINE HCL 25 MG PO TABS
12.5000 mg | ORAL_TABLET | Freq: Every day | ORAL | Status: DC
  Administered 2022-05-02 – 2022-05-07 (×6): 12.5 mg via ORAL
  Filled 2022-05-02 (×6): qty 0.5

## 2022-05-02 MED ORDER — ORAL CARE MOUTH RINSE: 15.0000 mL | OROMUCOSAL | Status: DC | PRN

## 2022-05-02 MED ORDER — ASPIRIN 81 MG PO TBEC
81.0000 mg | DELAYED_RELEASE_TABLET | Freq: Every day | ORAL | Status: DC
  Administered 2022-05-02 – 2022-05-08 (×7): 81 mg via ORAL
  Filled 2022-05-02 (×7): qty 1

## 2022-05-02 MED ORDER — TRAZODONE HCL 50 MG PO TABS
50.0000 mg | ORAL_TABLET | Freq: Every day | ORAL | Status: DC
  Administered 2022-05-02 – 2022-05-07 (×6): 50 mg via ORAL
  Filled 2022-05-02 (×6): qty 1

## 2022-05-02 MED ORDER — GLUCERNA SHAKE PO LIQD
237.0000 mL | Freq: Three times a day (TID) | ORAL | Status: DC
  Administered 2022-05-03 (×2): 237 mL via ORAL

## 2022-05-02 NOTE — Progress Notes (Signed)
Notified Damon Walker, [place] about pts decreased blood pressure. Order to increase pts fluid intake. Pt is alert, resting in bed, no complaints voiced. Wife states that it is common for his blood pressure to be low in the past as well.

## 2022-05-02 NOTE — Progress Notes (Signed)
[facility] Individual Statement of Services  Patient Name:  Damon Walker  Date:  05/02/2022  Welcome to the [facility].  Our goal is to provide you with an individualized program based on your diagnosis and situation, designed to meet your specific needs.  With this comprehensive rehabilitation program, you will be expected to participate in at least 3 hours of rehabilitation therapies Monday-Friday, with modified therapy programming on the weekends.  Your rehabilitation program will include the following services:  Physical Therapy (PT), Occupational Therapy (OT), Speech Therapy (ST), 24 hour per day rehabilitation nursing, Therapeutic Recreaction (TR), Neuropsychology, Care Coordinator, Rehabilitation Medicine, Nutrition Services, and Pharmacy Services  Weekly team conferences will be held on Wednesday to discuss your progress.  Your Inpatient Rehabilitation Care Coordinator will talk with you frequently to get your input and to update you on team discussions.  Team conferences with you and your family in attendance may also be held.  Expected length of stay: 5-7 days  Overall anticipated outcome: supervision level with cues  Depending on your progress and recovery, your program may change. Your Inpatient Rehabilitation Care Coordinator will coordinate services and will keep you informed of any changes. Your Inpatient Rehabilitation Care Coordinator's name and contact numbers are listed  below.  The following services may also be recommended but are not provided by the [facility] will be made to provide these services after discharge if needed.  Arrangements include referral to agencies that provide these services.  Your insurance has been verified to be:  [place] primary doctor is:  [name]  Pertinent information will be shared with your doctor and your insurance company.  Inpatient Rehabilitation Care Coordinator:  Ovidio Kin, [place] or Emilia Beck  Information discussed with and copy given to patient by: Elease Hashimoto, 05/02/2022, 9:50 AM

## 2022-05-02 NOTE — Plan of Care (Signed)
  Problem: Sit to Stand Goal: LTG:  Patient will perform sit to stand with assistance level (PT) Description: LTG:  Patient will perform sit to stand with assistance level (PT) Flowsheets (Taken 05/02/2022 1240) LTG: PT will perform sit to stand in preparation for functional mobility with assistance level: Independent with assistive device   Problem: RH Bed Mobility Goal: LTG Patient will perform bed mobility with assist (PT) Description: LTG: Patient will perform bed mobility with assistance, with/without cues (PT). Flowsheets (Taken 05/02/2022 1240) LTG: Pt will perform bed mobility with assistance level of: Independent   Problem: RH Bed to Chair Transfers Goal: LTG Patient will perform bed/chair transfers w/assist (PT) Description: LTG: Patient will perform bed to chair transfers with assistance (PT). Flowsheets (Taken 05/02/2022 1240) LTG: Pt will perform Bed to Chair Transfers with assistance level: Independent with assistive device    Problem: RH Car Transfers Goal: LTG Patient will perform car transfers with assist (PT) Description: LTG: Patient will perform car transfers with assistance (PT). Flowsheets (Taken 05/02/2022 1240) LTG: Pt will perform car transfers with assist:: Independent with assistive device    Problem: RH Ambulation Goal: LTG Patient will ambulate in home environment (PT) Description: LTG: Patient will ambulate in home environment, # of feet with assistance (PT). Flowsheets (Taken 05/02/2022 1240) LTG: Pt will ambulate in home environ  assist needed:: Supervision/Verbal cueing LTG: Ambulation distance in home environment: 50' Goal: LTG Patient will ambulate in community environment (PT) Description: LTG: Patient will ambulate in community environment, # of feet with assistance (PT). Flowsheets (Taken 05/02/2022 1240) LTG: Pt will ambulate in community environ  assist needed:: Supervision/Verbal cueing LTG: Ambulation distance in community environment: 150'    Problem: RH Stairs Goal: LTG Patient will ambulate up and down stairs w/assist (PT) Description: LTG: Patient will ambulate up and down # of stairs with assistance (PT) Flowsheets (Taken 05/02/2022 1240) LTG: Pt will ambulate up/down stairs assist needed:: Supervision/Verbal cueing LTG: Pt will  ambulate up and down number of stairs: 12

## 2022-05-02 NOTE — Progress Notes (Signed)
Inpatient Rehabilitation Care Coordinator Assessment and Plan Patient Details  Name: Damon Walker MRN: [id number] Date of Birth: 01-31-1946  Today's Date: 05/02/2022  Hospital Problems: Principal Problem:   Acute ischemic left middle cerebral artery (MCA) stroke [facility])  Past Medical History:  Past Medical History:  Diagnosis Date   Carotid arterial disease ([place]) 06/2014; 01/09/2015   Followed by Dr. Donnetta Hutching of [facility] -- a) Carotid u/s: 40-59% bilat ICA stenosis; b) 10/2019: Carotid Dopplers October 19, 8936: R ICA 1-01%, LICA 75-10%.  Right vertebral artery appears occluded.  Normal subclavian flow.  Normal left vertebral artery. -->  No change since 2020   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 ([place]) 05/14/2015   -- Given TPA.  MRI Brain: Acute L Occipital Lobe Infarct. Chronic microvascular ischemic changes in the white matter and left pons.  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   Chronic combined systolic and diastolic congestive heart failure, NYHA class 2 ([place]) 05/11/2015 - 05/15/15   a. EF 20-25% with anterior-anteroseptal akinesis (immediately post anterior STEMI).  ; b. 10/10/'16 Echo: EF 35-40% with moderate mid-apical anteroseptal, anterior and apical hypokinesis.   Coronary artery disease involving native heart 05/11/2015   Cath: 3V CAD with 90% prox RCA, 80% mid RCA, 80% OM3, (RCA and OM residual treated medically) 100% LAD - PCI to Prox-Mid LAD with Overlapping Promus Premier DES 3.0 x 38 & 3.0 x 16 (post-dilated to 3.5 mm)    Essential hypertension    H/O: GI bleed 05/15/2015   s/p Sigmoid Polypectomy 05/04/2015 - prior to STEMI on May 11, 2015; Had GI Bleed following TPA for CVA, while on ASA & Brilinta; Flex Sig - 1. Internal Hemorrhoids, 2. Distal sigmoid polypectomy site with flat [place] status  post biopsy and tattoo with 12m spot over 3 injections 3. Proximal sigmoid polypectomy site seen as well, 4) otherwise normal with no evidence of bleed   Hyperlipidemia with target LDL less than 70    Ischemic cardiomyopathy 05/11/2015   EF 40% on cath 05/11/2015 after anterior STEMI, EF 20-25% on echo 05/13/2015; 12/2015 =  EF ~45%.   Liver disease, chronic, with cirrhosis [place] 11/2019   Mild possible early cirrhotic liver disease noted on CT scan.   Prediabetes Oct 2016   A1C 6.0 in Oct 2016   ST elevation myocardial infarction (STEMI) involving left anterior descending (LAD) coronary artery with complication ([place] 125/03/5276  100% Prox LAD - PCI with overlapping DES x 2   Tobacco abuse    a. 30 yrs - 1.5 ppd. - Quit 05/11/2015   Type 2 diabetes mellitus with other specified complication ([place] 38/24/2353  Vision abnormalities    Right hemianopsia   Past Surgical History:  Past Surgical History:  Procedure Laterality Date   CARDIAC CATHETERIZATION N/A 05/11/2015   Procedure: Left Heart Cath and Coronary Angiography;  Surgeon: Peter M JMartinique MD;  [facility];  Service: Cardiovascular;  100% pLAD (long lesion). RCA - prox 90%, mid 80%. OM2 & OM3 80% (OM2 & 3 not necessarily PCI targets); EF 35-45% with Anterio HK   CARDIAC CATHETERIZATION N/A 05/11/2015   Procedure: Coronary Stent Intervention;  Surgeon: Peter M JMartinique MD;  [facility];  Service: Cardiovascular; PCI to Prox-Mid LAD with Overlapping Promus Premier DES 3.0 x 38 & 3.0 x 16 (post-dilated to 3.5  mm)     FLEXIBLE SIGMOIDOSCOPY N/A 05/18/2015   Procedure: FLEXIBLE SIGMOIDOSCOPY;  Surgeon: Clarene Essex, MD;  Location: [facility] ENDOSCOPY;  Service: Endoscopy;  Laterality: N/A;   NM MYOVIEW LTD  08/2016   EF 30% with large anterior-anteroseptal and apical infarct consistent with LAD infarct. His HIGH RISK because of large infarct. No ischemia.   S/P Appendectomy     Age 76   S/P Inguinal Hernia Repair     In  his 20's.   TRANSTHORACIC ECHOCARDIOGRAM  05/15/2015   Mild LVH, EF 35-40% - Mod HK of mid-apical anteroseptal, anterior & apical walls.  Gr 1 DD. Mild bilateral Atrial Enlargement.   TRANSTHORACIC ECHOCARDIOGRAM  12/2015   EF up to ~40-45% wiht Anterior-Anteroapical HK.  Mod LV dilation with increased LVEDP.   TRANSTHORACIC ECHOCARDIOGRAM  12/'17; 4/'18   a) EF ~30-35% (in setting of HTN Urgency). - Gr 2 DD;; b) EF 30-35% (per Dr. Sallyanne Kuster - 35-40%). Anterior-anteroapical Akinesis. Gr2 DD w/ high filling pressures. Mild LA dilation. Mild RV dilation.   Social History:  reports that he quit smoking about 7 years ago. His smoking use included cigarettes. He has a 30.00 pack-year smoking history. He has been exposed to tobacco smoke. He has never used smokeless tobacco. He reports that he does not drink alcohol and does not use drugs.  Family / Support Systems Marital Status: Married Patient Roles: Spouse, Parent Spouse/Significant Other: Mateo Flow [phone] Children: Joslyn Devon [phone] Pt has two children that live in [place] Other Supports: neighbors and friends Anticipated Caregiver: Wife Ability/Limitations of Caregiver: Wife works 6a-7p full time at [employer] in [employer]. Will take wither FMLA or need to retire Caregiver Availability: 24/7 Family Dynamics: Close with wife and his children who live in [place]. He and they call him frequently to keep updated on he's doing.  Social History Preferred language: English Religion: None Cultural Background: No issues Education: Medical sales representative - How often do you need to have someone help you when you read instructions, pamphlets, or other written material from your doctor or pharmacy?: Patient unable to respond Writes: Yes Employment Status: Retired Public relations account executive Issues: No issues Guardian/Conservator: None-according to MD pt is not fully capable of making his own decisions while here. Will look toward his  wife to make any decisions while here   Abuse/Neglect Abuse/Neglect Assessment Can Be Completed: Yes Physical Abuse: Denies Verbal Abuse: Denies Sexual Abuse: Denies Exploitation of patient/patient's resources: Denies Self-Neglect: Denies  Patient response to: Social Isolation - How often do you feel lonely or isolated from those around you?: Never  Emotional Status Pt's affect, behavior and adjustment status: Pt has always been independent and taken care of himself even after his prior CVA in 2016. He had recovered from this. He likes to play computer games and was driving prior to admission Recent Psychosocial Issues: other health issues-very [facility] Psychiatric History: No history may benefit from seeing neuro-psych while here for coping. Substance Abuse History: No issues  Patient / Family Perceptions, Expectations & Goals Pt/Family understanding of illness & functional limitations: Pt and wife can explain his stroke and deficits as a result of it. Wife is here to talk with the MD and get an update on husband's condition. She plans to be here today for evaluations and see how he does. She would like to be keep updated on his conditon, will let MD/PA know Premorbid pt/family roles/activities: Husband, father, retiree, neighbor, etc Anticipated changes in roles/activities/participation: resume Pt/family expectations/goals: Pt states:" I  want to do well." he is very sleepy today from procedure yesterday. Wife states: " I hope he can do well and be mobile when he leaves here."  [employer]: None Premorbid Home Care/DME Agencies: Other (Comment) (tub seat, cane, transport chair?) Transportation available at discharge: Wife Is the patient able to respond to transportation needs?: Yes In the past 12 months, has lack of transportation kept you from medical appointments or from getting medications?: No In the past 12 months, has lack of transportation kept you from  meetings, work, or from getting things needed for daily living?: No Resource referrals recommended: Neuropsychology  Discharge Planning Living Arrangements: Spouse/significant other Support Systems: Spouse/significant other, Children, Friends/neighbors Type of Residence: Private residence Insurance Resources: Multimedia programmer (specify) Psychologist, prison and probation services) Museum/gallery curator Resources: Fish farm manager, Family Support Financial Screen Referred: No Living Expenses: Rent Money Management: Patient, Spouse Does the patient have any problems obtaining your medications?: No Home Management: Wife and pt Patient/Family Preliminary Plans: Return home with wife who may take a FMLA if needed. She does work full time but will take time off or reture if need be. She is here for evaluations and and to see how he does along with providing support to husband. Made aware of team evaluating pt and setting goals. Care Coordinator Barriers to Discharge: Insurance for SNF coverage, Lack of/limited family support Care Coordinator Anticipated Follow Up Needs: HH/OP  Clinical Impression Pleasant very sleepy gentleman who allows his wife to answer the questions this worker has Pt was independent prior to admission and both hope he can get as  close to this as possible prior to discharge. Aware being evaluated and goals being set. Wife will take FMLA or if need be retire. Will work on discharge needs.  Elease Hashimoto 05/02/2022, 9:48 AM

## 2022-05-02 NOTE — Progress Notes (Addendum)
   VASCULAR SURGERY ASSESSMENT & PLAN:   POD 2 LEFT TCAR: The patient is doing well and has no complaints.  His incision looks fine.  He is on aspirin, Plavix, and a statin.  All TCAR's need to be on dual antiplatelet therapy for 1 month.  I have added back aspirin (81 mg).  We have arranged follow-up.  We will be available as needed.   SUBJECTIVE:   No complaints this morning.  PHYSICAL EXAM:   Vitals:   05/01/22 1819 05/01/22 1931 05/02/22 0141 05/02/22 0334  BP: (!) 123/53 (!) 110/41 (!) 125/48 (!) 141/49  Pulse: (!) 55 (!) 54 (!) 55 (!) 55  Resp: '17 16 16 16  '$ Temp: 97.9 F (36.6 C) 98.1 F (36.7 C) 98 F (36.7 C) 98 F (36.7 C)  TempSrc: Oral     SpO2: 98% 98% 98% 99%  Weight: 88.7 kg     Height: '5\' 8"'$  (1.727 m)      His neurologic exam is stable.  LABS:   Lab Results  Component Value Date   WBC 7.9 05/02/2022   HGB 12.0 (L) 05/02/2022   HCT 35.1 (L) 05/02/2022   MCV 90.2 05/02/2022   PLT 197 05/02/2022   Lab Results  Component Value Date   CREATININE 1.01 05/02/2022   Lab Results  Component Value Date   INR 1.1 04/21/2022   CBG (last 3)  Recent Labs    05/01/22 0614 05/01/22 1148 05/01/22 1612  GLUCAP 127* 120* 133*    PROBLEM LIST:    Principal Problem:   Acute ischemic left middle cerebral artery (MCA) stroke (HCC)   CURRENT MEDS:    carvedilol  12.5 mg Oral BID WC   clopidogrel  75 mg Oral Daily   docusate sodium  100 mg Oral Daily   doxycycline  100 mg Oral Q12H   enoxaparin (LOVENOX) injection  40 mg Subcutaneous Q24H   [START ON 05/03/2022] famotidine  40 mg Oral QODAY   ferrous sulfate  325 mg Oral Q breakfast   mouth rinse  15 mL Mouth Rinse 4 times per day   [START ON 05/03/2022] pantoprazole  20 mg Oral QODAY   rosuvastatin  40 mg Oral q1800   sertraline  12.5 mg Oral QHS    Deitra Mayo Office: [phone] 05/02/2022

## 2022-05-02 NOTE — Plan of Care (Signed)
  Problem: RH Comprehension Communication Goal: LTG Patient will comprehend basic/complex auditory (SLP) Description: LTG: Patient will comprehend basic/complex auditory information with cues (SLP). 05/02/2022 1728 by Charna Elizabeth T, CCC-SLP Flowsheets (Taken 05/02/2022 1728) LTG: Patient will comprehend auditory information with cueing (SLP): Maximal Assistance - Patient 25 - 49%  Maximal Assistance - Patient 25 - 49%   Problem: RH Expression Communication Goal: LTG Patient will express needs/wants via multi-modal(SLP) Description: LTG:  Patient will express needs/wants via multi-modal communication (gestures/written, etc) with cues (SLP) Flowsheets (Taken 05/02/2022 1725) LTG: Patient will express needs/wants via multimodal communication (gestures/written, etc) with cueing (SLP): Moderate Assistance - Patient 50 - 74%   Problem: RH Awareness Goal: LTG: Patient will demonstrate awareness during functional activites type of (SLP) Description: LTG: Patient will demonstrate awareness during functional activites type of (SLP) Flowsheets (Taken 05/02/2022 1725) Patient will demonstrate during cognitive/linguistic activities awareness type of: Intellectual LTG: Patient will demonstrate awareness during cognitive/linguistic activities with assistance of (SLP): Moderate Assistance - Patient 50 - 74%   Problem: RH Attention Goal: LTG Patient will demonstrate this level of attention during functional activites (SLP) Description: LTG:  Patient will will demonstrate this level of attention during functional activites (SLP) Flowsheets (Taken 05/02/2022 1726) Patient will demonstrate during cognitive/linguistic activities the attention type of: Focused Patient will demonstrate this level of attention during cognitive/linguistic activities in: Controlled LTG: Patient will demonstrate this level of attention during cognitive/linguistic activities with assistance of (SLP): Moderate Assistance - Patient  50 - 74%

## 2022-05-02 NOTE — Plan of Care (Signed)
Nutrition Education Note  RD consulted for nutrition education regarding CHF and diabetes.  Lab Results  Component Value Date   HGBA1C 5.8 (H) 04/22/2022    Pt was sleeping at time of assessment. RD was able to provide wife with education and handouts. The wife reports that the pt is following a low sodium diet but has a big sweet tooth and will often consume sweet snacks on a daily basis.   RD provided "Heart Healthy, Consistent Carbohydrate Nutrition Therapy" handout from the Academy of Nutrition and Dietetics. Reviewed patient's dietary recall. Provided examples on ways to decrease sodium intake in diet. Discouraged intake of processed foods and use of salt shaker. Encouraged fresh fruits and vegetables as well as whole grain sources of carbohydrates to maximize fiber intake.   RD discussed why it is important for patient to adhere to diet recommendations, and emphasized the role of fluids, foods to avoid, and importance of weighing self daily.   Discussed different food groups and their effects on blood sugar, emphasizing carbohydrate-containing foods. Provided list of carbohydrates and recommended serving sizes of common foods.  Discussed importance of controlled and consistent carbohydrate intake throughout the day. Provided examples of ways to balance meals/snacks and encouraged intake of high-fiber, whole grain complex carbohydrates. Teach back method used.  Expect fair compliance.  Body mass index is 29.73 kg/m. Pt meets criteria for Overweight based on current BMI.  Current diet order is Heart Healthy Carb Modified, patient is consuming approximately 10-50% of meals at this time. Labs and medications reviewed. No further nutrition interventions warranted at this time. RD contact information provided. If additional nutrition issues arise, please re-consult RD.   Thalia Bloodgood, RD, LDN, CNSC

## 2022-05-02 NOTE — Evaluation (Signed)
Occupational Therapy Assessment and Plan  Patient Details  Name: Damon Walker MRN: [id number] Date of Birth: 07/01/1946  OT Diagnosis: ataxia, cognitive deficits, disturbance of vision, and muscle weakness (generalized) Rehab Potential: Rehab Potential (ACUTE ONLY): Excellent ELOS: 5-7   Today's Date: 05/02/2022 OT Individual Time: 3532-9924 OT Individual Time Calculation (min): 60 min     Hospital Problem: Principal Problem:   Acute ischemic left middle cerebral artery (MCA) stroke ([place])   Past Medical History:  Past Medical History:  Diagnosis Date   Carotid arterial disease ([place]) 06/2014; 01/09/2015   Followed by Dr. Donnetta Hutching of [facility] -- a) Carotid u/s: 40-59% bilat ICA stenosis; b) 10/2019: Carotid Dopplers October 19, 2681: R ICA 4-19%, LICA 62-22%.  Right vertebral artery appears occluded.  Normal subclavian flow.  Normal left vertebral artery. 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) 05/14/2015   -- Given TPA.  MRI Brain: Acute L Occipital Lobe Infarct. Chronic microvascular ischemic changes in the white matter and left pons.  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   Chronic combined systolic and diastolic congestive heart failure, NYHA class 2 ([place]) 05/11/2015 - 05/15/15   a. EF 20-25% with anterior-anteroseptal akinesis (immediately post anterior STEMI).  ; b. 10/10/'16 Echo: EF 35-40% with moderate mid-apical anteroseptal, anterior and apical hypokinesis.   Coronary artery disease involving native heart 05/11/2015   Cath: 3V CAD with 90% prox RCA, 80% mid RCA, 80% OM3, (RCA and OM residual treated medically) 100% LAD - PCI to Prox-Mid LAD with Overlapping Promus Premier DES 3.0 x 38 & 3.0 x 16 (post-dilated to 3.5 mm)    Essential hypertension    H/O: GI bleed  05/15/2015   s/p Sigmoid Polypectomy 05/04/2015 - prior to STEMI on May 11, 2015; Had GI Bleed following TPA for CVA, while on ASA & Brilinta; Flex Sig - 1. Internal Hemorrhoids, 2. Distal sigmoid polypectomy site with flat [place] status post biopsy and tattoo with 45m spot over 3 injections 3. Proximal sigmoid polypectomy site seen as well, 4) otherwise normal with no evidence of bleed   Hyperlipidemia with target LDL less than 70    Ischemic cardiomyopathy 05/11/2015   EF 40% on cath 05/11/2015 after anterior STEMI, EF 20-25% on echo 05/13/2015; 12/2015 =  EF ~45%.   Liver disease, chronic, with cirrhosis ([place] 11/2019   Mild possible early cirrhotic liver disease noted on CT scan.   Prediabetes Oct 2016   A1C 6.0 in Oct 2016   ST elevation myocardial infarction (STEMI) involving left anterior descending (LAD) coronary artery with complication ([place] 197/04/8920  100% Prox LAD - PCI with overlapping DES x 2   Tobacco abuse    a. 30 yrs - 1.5 ppd. - Quit 05/11/2015   Type 2 diabetes mellitus with other specified complication ([place] 31/94/1740  Vision abnormalities    Right hemianopsia   Past Surgical History:  Past Surgical History:  Procedure Laterality Date   CARDIAC CATHETERIZATION N/A 05/11/2015   Procedure: Left Heart Cath and Coronary Angiography;  Surgeon: Peter M JMartinique MD;  [facility];  Service: Cardiovascular;  100% pLAD (long lesion). RCA - prox 90%, mid 80%. OM2 & OM3 80% (OM2 & 3 not necessarily PCI targets); EF 35-45% with Anterio HK   CARDIAC CATHETERIZATION N/A 05/11/2015   Procedure:  Coronary Stent Intervention;  Surgeon: Peter M Martinique, MD;  Location: [facility];  Service: Cardiovascular; PCI to Prox-Mid LAD with Overlapping Promus Premier DES 3.0 x 38 & 3.0 x 16 (post-dilated to 3.5 mm)     FLEXIBLE SIGMOIDOSCOPY N/A 05/18/2015   Procedure: FLEXIBLE SIGMOIDOSCOPY;  Surgeon: Clarene Essex, MD;  Location: [facility];  Service: Endoscopy;  Laterality: N/A;    NM MYOVIEW LTD  08/2016   EF 30% with large anterior-anteroseptal and apical infarct consistent with LAD infarct. His HIGH RISK because of large infarct. No ischemia.   S/P Appendectomy     Age 39   S/P Inguinal Hernia Repair     In his 20's.   TRANSCAROTID ARTERY REVASCULARIZATION  Left 04/30/2022   Procedure: Transcarotid Artery Revascularization;  Surgeon: Angelia Mould, MD;  [facility];  Service: Vascular;  Laterality: Left;   TRANSTHORACIC ECHOCARDIOGRAM  05/15/2015   Mild LVH, EF 35-40% - Mod HK of mid-apical anteroseptal, anterior & apical walls.  Gr 1 DD. Mild bilateral Atrial Enlargement.   TRANSTHORACIC ECHOCARDIOGRAM  12/2015   EF up to ~40-45% wiht Anterior-Anteroapical HK.  Mod LV dilation with increased LVEDP.   TRANSTHORACIC ECHOCARDIOGRAM  12/'17; 4/'18   a) EF ~30-35% (in setting of HTN Urgency). - Gr 2 DD;; b) EF 30-35% (per Dr. Sallyanne Kuster - 35-40%). Anterior-anteroapical Akinesis. Gr2 DD w/ high filling pressures. Mild LA dilation. Mild RV dilation.   ULTRASOUND GUIDANCE FOR VASCULAR ACCESS Right 04/30/2022   Procedure: ULTRASOUND GUIDANCE FOR VASCULAR ACCESS;  Surgeon: Angelia Mould, MD;  Location: [facility] OR;  Service: Vascular;  Laterality: Right;    Assessment & Plan Clinical Impression: Patient is a 76 y.o. year old male with recent admission to the hospital on  9/17 with AMS. 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 During night of 9/17 pt found in another room's bathroom, hypoxic, and diaphoretic. Placed on Bipap due to continued respiratory distress this has now been weaned.  Pt s/p TCAR on 04/30/22. PMH includes: CAD, CHF, HLD, CVA of L PCA in 2016, STEMI with DES x2, and DM II. Patient transferred to CIR on 05/01/2022 .    Patient currently requires min with basic self-care skills secondary to muscle weakness, decreased cardiorespiratoy endurance, decreased coordination, decreased visual perceptual skills and  decreased visual motor skills, decreased attention to right, decreased initiation, decreased attention, decreased awareness, decreased problem solving, decreased safety awareness, decreased memory, and delayed processing, and decreased standing balance and decreased balance strategies.  Prior to hospitalization, patient could complete BADL with independent .  Patient will benefit from skilled intervention to increase independence with basic self-care skills prior to discharge home with care partner.  Anticipate patient will require 24 hour supervision and follow up outpatient.  OT - End of Session Endurance Deficit: Yes Endurance Deficit Description: Impaired activity tolerance OT Assessment Rehab Potential (ACUTE ONLY): Excellent OT Patient demonstrates impairments in the following area(s): Balance;Behavior;Cognition;Endurance;Motor;Perception;Safety;Vision OT Basic ADL's Functional Problem(s): Grooming;Bathing;Dressing;Toileting OT Transfers Functional Problem(s): Toilet;Tub/Shower OT Additional Impairment(s): None OT Plan OT Intensity: Minimum of 1-2 x/day, 45 to 90 minutes OT Frequency: 5 out of 7 days OT Duration/Estimated Length of Stay: 5-7 OT Treatment/Interventions: Balance/vestibular training;Cognitive remediation/compensation;Community reintegration;Discharge planning;Disease mangement/prevention;DME/adaptive equipment instruction;Functional electrical stimulation;Functional mobility training;Neuromuscular re-education;Pain management;Patient/family education;Psychosocial support;Self Care/advanced ADL retraining;Skin care/wound managment;Splinting/orthotics;Therapeutic Activities;Therapeutic Exercise;UE/LE Strength taining/ROM;UE/LE Coordination activities;Visual/perceptual remediation/compensation;Wheelchair propulsion/positioning OT Basic Self-Care Anticipated Outcome(s): supervision OT Toileting Anticipated Outcome(s): supervision OT Bathroom Transfers Anticipated Outcome(s):  Supervision OT Recommendation Patient destination:  Home Follow Up Recommendations: Outpatient OT;24 hour supervision/assistance Equipment Recommended: To be determined Equipment Details: May need a tub transfer bench   OT Evaluation Precautions/Restrictions  Precautions Precautions: Fall Precaution Comments: HOH - hearing aides in room Restrictions Weight Bearing Restrictions: No Pain Pain Assessment Pain Scale: 0-10 Pain Score: 0-No pain Home Living/Prior Functioning Home Living Living Arrangements: Spouse/significant other Available Help at Discharge: Family, Available 24 hours/day Type of Home: Apartment Home Access: Level entry Home Layout: One level Bathroom Shower/Tub: Optometrist: Yes  Lives With: Spouse IADL History Leisure and Hobbies: reading, house work, cooking Prior Function Level of Independence: Independent with basic ADLs, Independent with gait, Independent with homemaking with ambulation, Independent with transfers  Able to Take Stairs?: Yes Driving: Yes Vocation: Retired Leisure: Hobbies-yes (Comment) (Playing games onn the computer) Vision Baseline Vision/History: 0 No visual deficits (Sometimes will wear reading glasses when working on the computer) Ability to See in Adequate Light: 0 Adequate Patient Visual Report: No change from baseline (Per spouse report) Vision Assessment?: Vision impaired- to be further tested in functional context Perception  Perception: Impaired Praxis Praxis: Impaired Praxis Impairment Details: Ideation;Motor planning;Initiation;Ideomotor Cognition Cognition Overall Cognitive Status: Impaired/Different from baseline Arousal/Alertness: Awake/alert Memory: Impaired Memory Impairment: Storage deficit;Retrieval deficit;Decreased recall of new information Attention: Sustained Focused Attention: Appears intact Focused Attention Impairment: Verbal basic Sustained  Attention: Impaired Sustained Attention Impairment: Verbal basic;Functional basic Awareness: Impaired Awareness Impairment: Intellectual impairment Problem Solving: Impaired Problem Solving Impairment: Verbal basic;Functional basic Behaviors: Impulsive Safety/Judgment: Impaired Brief Interview for Mental Status (BIMS) Repetition of Three Words (First Attempt): None Temporal Orientation: Year: Correct Temporal Orientation: Month: Accurate within 5 days Temporal Orientation: Day: Incorrect Recall: "Sock": No, could not recall Recall: "Blue": No, could not recall Recall: "Bed": No, could not recall BIMS Summary Score: 5 Sensation Sensation Light Touch: Appears Intact Hot/Cold: Appears Intact Coordination Gross Motor Movements are Fluid and Coordinated: No Fine Motor Movements are Fluid and Coordinated: No Coordination and Movement Description: Decreased coordination with under and overshooting noted- Decreased ability to comprehend one-step commands limiting his ability to perform the below tests Finger Nose Finger Test: Under/overshooting bilaterally R>L Heel Shin Test: Able to tap R foot to left shin, however unable to slide up/down despite demonstration and tactilce cues. Motor  Motor Motor: Motor apraxia Motor - Skilled Clinical Observations: Impaired coordination  Trunk/Postural Assessment  Cervical Assessment Cervical Assessment: Exceptions to [facility] (forward head) Thoracic Assessment Thoracic Assessment: Exceptions to [facility] (Rounded shoulders and R shoulder depression) Lumbar Assessment Lumbar Assessment: Exceptions to [facility] (Posterior pelvic tilt) Postural Control Postural Control: Deficits on evaluation (Delayed)  Balance Balance Balance Assessed: Yes Static Standing Balance Static Standing - Balance Support: During functional activity Static Standing - Level of Assistance: 5: Stand by assistance Dynamic Standing Balance Dynamic Standing - Balance Support: During  functional activity;No upper extremity supported Dynamic Standing - Level of Assistance: 4: Min assist Extremity/Trunk Assessment RUE Assessment RUE Assessment: Within Functional Limits LUE Assessment LUE Assessment: Within Functional Limits  Care Tool Care Tool Self Care Eating   Eating Assist Level: Supervision/Verbal cueing    Oral Care    Oral Care Assist Level: Supervision/Verbal cueing    Bathing   Body parts bathed by patient: Right arm;Left arm;Chest;Abdomen;Front perineal area;Buttocks;Right upper leg;Left upper leg;Right lower leg;Left lower leg;Face     Assist Level: Minimal Assistance - Patient > 75%    Upper Body Dressing(including orthotics)   What is the patient wearing?: Pull over shirt   Assist  Level: Supervision/Verbal cueing    Lower Body Dressing (excluding footwear)   What is the patient wearing?: Pants;Underwear/pull up Assist for lower body dressing: Minimal Assistance - Patient > 75%    Putting on/Taking off footwear   What is the patient wearing?: Socks;Ted hose Assist for footwear: Minimal Assistance - Patient > 75%       Care Tool Toileting Toileting activity   Assist for toileting: Minimal Assistance - Patient > 75%     Care Tool Bed Mobility Roll left and right activity   Roll left and right assist level: Contact Guard/Touching assist    Sit to lying activity   Sit to lying assist level: Contact Guard/Touching assist    Lying to sitting on side of bed activity   Lying to sitting on side of bed assist level: the ability to move from lying on the back to sitting on the side of the bed with no back support.: Contact Guard/Touching assist     Care Tool Transfers Sit to stand transfer   Sit to stand assist level: Contact Guard/Touching assist    Chair/bed transfer   Chair/bed transfer assist level: Contact Guard/Touching assist     Toilet transfer   Assist Level: Contact Guard/Touching assist     Care Tool Cognition  Expression  of Ideas and Wants Expression of Ideas and Wants: 3. Some difficulty - exhibits some difficulty with expressing needs and ideas (e.g, some words or finishing thoughts) or speech is not clear  Understanding Verbal and Non-Verbal Content Understanding Verbal and Non-Verbal Content: 2. Sometimes understands - understands only basic conversations or simple, direct phrases. Frequently requires cues to understand   Memory/Recall Ability Memory/Recall Ability : Current season;That he or she is in a hospital/hospital unit   Refer to Care Plan for [place] 1 OT Short Term Goal 1 (Week 1): LTG=STG 2/2 ELOS  Recommendations for other services: None    Skilled Therapeutic Intervention OT eval completed addressing rehab process, OT purpose, POC, ELOS, and goals.  Patients spouse also present for session. Pt at overall min A level for functional BADL tasks, but needs mod multimodal cues for problem solving and attention within functional task. Pt difficult to get to respond or follow commands at times during BADL tasks. Pt hard of hearing and does best with hearing aids in. Pt  also demonstrated some visual perceptual deficits that were hard to tease out due to communication deficits. See functional navigator for further details regarding BADL performance.  ADL ADL Eating: Set up;Moderate cueing;Supervision/safety Grooming: Setup;Moderate cueing;Supervision/safety Upper Body Bathing: Supervision/safety;Minimal cueing Lower Body Bathing: Minimal assistance Upper Body Dressing: Supervision/safety;Minimal cueing Lower Body Dressing: Minimal assistance Toileting: Minimal assistance Toilet Transfer: Minimal assistance Tub/Shower Transfer: Minimal assistance Mobility  Bed Mobility Bed Mobility: Rolling Right;Rolling Left;Supine to Sit;Sit to Supine Rolling Right: Contact Guard/Touching assist Rolling Left: Contact Guard/Touching assist Supine to Sit: Supervision/Verbal  cueing Sit to Supine: Supervision/Verbal cueing Transfers Sit to Stand: Contact Guard/Touching assist Stand to Sit: Contact Guard/Touching assist   Discharge Criteria: Patient will be discharged from OT if patient refuses treatment 3 consecutive times without medical reason, if treatment goals not met, if there is a change in medical status, if patient makes no progress towards goals or if patient is discharged from hospital.  The above assessment, treatment plan, treatment alternatives and goals were discussed and mutually agreed upon: by patient and by family  Valma Cava 05/02/2022, 4:08 PM

## 2022-05-02 NOTE — Plan of Care (Deleted)
  Problem: RH Comprehension Communication Goal: LTG Patient will comprehend basic/complex auditory (SLP) Description: LTG: Patient will comprehend basic/complex auditory information with cues (SLP). Flowsheets (Taken 05/02/2022 1725) LTG: Patient will comprehend: Basic auditory information LTG: Patient will comprehend auditory information with cueing (SLP):  Moderate Assistance - Patient 50 - 74%  Maximal Assistance - Patient 25 - 49%   Problem: RH Expression Communication Goal: LTG Patient will express needs/wants via multi-modal(SLP) Description: LTG:  Patient will express needs/wants via multi-modal communication (gestures/written, etc) with cues (SLP) Flowsheets (Taken 05/02/2022 1725) LTG: Patient will express needs/wants via multimodal communication (gestures/written, etc) with cueing (SLP): Moderate Assistance - Patient 50 - 74%   Problem: RH Awareness Goal: LTG: Patient will demonstrate awareness during functional activites type of (SLP) Description: LTG: Patient will demonstrate awareness during functional activites type of (SLP) Flowsheets (Taken 05/02/2022 1725) Patient will demonstrate during cognitive/linguistic activities awareness type of: Intellectual LTG: Patient will demonstrate awareness during cognitive/linguistic activities with assistance of (SLP): Moderate Assistance - Patient 50 - 74%   Problem: RH Attention Goal: LTG Patient will demonstrate this level of attention during functional activites (SLP) Description: LTG:  Patient will will demonstrate this level of attention during functional activites (SLP) Flowsheets (Taken 05/02/2022 1726) Patient will demonstrate during cognitive/linguistic activities the attention type of: Focused Patient will demonstrate this level of attention during cognitive/linguistic activities in: Controlled LTG: Patient will demonstrate this level of attention during cognitive/linguistic activities with assistance of (SLP): Moderate Assistance  - Patient 50 - 74%

## 2022-05-02 NOTE — Plan of Care (Signed)
  Problem: RH Balance Goal: LTG Patient will maintain dynamic standing with ADLs (OT) Description: LTG:  Patient will maintain dynamic standing balance with assist during activities of daily living (OT)  Flowsheets (Taken 05/02/2022 1550) LTG: Pt will maintain dynamic standing balance during ADLs with: Supervision/Verbal cueing   Problem: Sit to Stand Goal: LTG:  Patient will perform sit to stand in prep for activites of daily living with assistance level (OT) Description: LTG:  Patient will perform sit to stand in prep for activites of daily living with assistance level (OT) Flowsheets (Taken 05/02/2022 1550) LTG: PT will perform sit to stand in prep for activites of daily living with assistance level: Supervision/Verbal cueing   Problem: RH Grooming Goal: LTG Patient will perform grooming w/assist,cues/equip (OT) Description: LTG: Patient will perform grooming with assist, with/without cues using equipment (OT) Flowsheets (Taken 05/02/2022 1550) LTG: Pt will perform grooming with assistance level of: Supervision/Verbal cueing   Problem: RH Bathing Goal: LTG Patient will bathe all body parts with assist levels (OT) Description: LTG: Patient will bathe all body parts with assist levels (OT) Flowsheets (Taken 05/02/2022 1550) LTG: Pt will perform bathing with assistance level/cueing: Supervision/Verbal cueing   Problem: RH Dressing Goal: LTG Patient will perform upper body dressing (OT) Description: LTG Patient will perform upper body dressing with assist, with/without cues (OT). Flowsheets (Taken 05/02/2022 1550) LTG: Pt will perform upper body dressing with assistance level of: Supervision/Verbal cueing Goal: LTG Patient will perform lower body dressing w/assist (OT) Description: LTG: Patient will perform lower body dressing with assist, with/without cues in positioning using equipment (OT) Flowsheets (Taken 05/02/2022 1550) LTG: Pt will perform lower body dressing with assistance level  of: Supervision/Verbal cueing   Problem: RH Toileting Goal: LTG Patient will perform toileting task (3/3 steps) with assistance level (OT) Description: LTG: Patient will perform toileting task (3/3 steps) with assistance level (OT)  Flowsheets (Taken 05/02/2022 1550) LTG: Pt will perform toileting task (3/3 steps) with assistance level: Supervision/Verbal cueing   Problem: RH Vision Goal: RH LTG Vision Theme park manager) Flowsheets (Taken 05/02/2022 1550) LTG: Vision Goals: Patient will locate items on L side of sink with min questioning cues.   Problem: RH Toilet Transfers Goal: LTG Patient will perform toilet transfers w/assist (OT) Description: LTG: Patient will perform toilet transfers with assist, with/without cues using equipment (OT) Flowsheets (Taken 05/02/2022 1550) LTG: Pt will perform toilet transfers with assistance level of: Supervision/Verbal cueing   Problem: RH Tub/Shower Transfers Goal: LTG Patient will perform tub/shower transfers w/assist (OT) Description: LTG: Patient will perform tub/shower transfers with assist, with/without cues using equipment (OT) Flowsheets (Taken 05/02/2022 1550) LTG: Pt will perform tub/shower stall transfers with assistance level of: Supervision/Verbal cueing   Problem: RH Attention Goal: LTG Patient will demonstrate this level of attention during functional activites (OT) Description: LTG:  Patient will demonstrate this level of attention during functional activites  (OT) Flowsheets (Taken 05/02/2022 1550) LTG: Patient will demonstrate this level of attention during functional activites (OT): Supervision   Problem: RH Awareness Goal: LTG: Patient will demonstrate awareness during functional activites type of (OT) Description: LTG: Patient will demonstrate awareness during functional activites type of (OT) Flowsheets (Taken 05/02/2022 1550) LTG: Patient will demonstrate awareness during functional activites type of (OT): Supervision

## 2022-05-02 NOTE — Progress Notes (Signed)
Inpatient Rehabilitation  Patient information reviewed and entered into eRehab system by Carmeline Kowal M. Keyri Salberg, M.A., CCC/SLP, PPS Coordinator.  Information including medical coding, functional ability and quality indicators will be reviewed and updated through discharge.    

## 2022-05-02 NOTE — Evaluation (Signed)
Physical Therapy Assessment and Plan  Patient Details  Name: Damon Walker MRN: [id number] Date of Birth: 1946/08/01  PT Diagnosis: Abnormality of gait, Ataxia, Ataxic gait, Cognitive deficits, Difficulty walking, and Muscle weakness Rehab Potential: Good ELOS: 5-7 days   Today's Date: 05/02/2022 PT Individual Time: 0900-1015 PT Individual Time Calculation (min): 75 min    Hospital Problem: Principal Problem:   Acute ischemic left middle cerebral artery (MCA) stroke ([place])   Past Medical History:  Past Medical History:  Diagnosis Date   Carotid arterial disease ([place]) 06/2014; 01/09/2015   Followed by Dr. Donnetta Hutching of [facility] -- a) Carotid u/s: 40-59% bilat ICA stenosis; b) 10/2019: Carotid Dopplers October 19, 4096: R ICA 1-19%, LICA 14-78%.  Right vertebral artery appears occluded.  Normal subclavian flow.  Normal left vertebral artery. -->  No change since 2020   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 ([place]) 05/14/2015   -- Given TPA.  MRI Brain: Acute L Occipital Lobe Infarct. Chronic microvascular ischemic changes in the white matter and left pons.  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   Chronic combined systolic and diastolic congestive heart failure, NYHA class 2 ([place]) 05/11/2015 - 05/15/15   a. EF 20-25% with anterior-anteroseptal akinesis (immediately post anterior STEMI).  ; b. 10/10/'16 Echo: EF 35-40% with moderate mid-apical anteroseptal, anterior and apical hypokinesis.   Coronary artery disease involving native heart 05/11/2015   Cath: 3V CAD with 90% prox RCA, 80% mid RCA, 80% OM3, (RCA and OM residual treated medically) 100% LAD - PCI to Prox-Mid LAD with Overlapping Promus Premier DES 3.0 x 38 & 3.0 x 16 (post-dilated to 3.5 mm)    Essential hypertension    H/O: GI bleed 05/15/2015   s/p Sigmoid  Polypectomy 05/04/2015 - prior to STEMI on May 11, 2015; Had GI Bleed following TPA for CVA, while on ASA & Brilinta; Flex Sig - 1. Internal Hemorrhoids, 2. Distal sigmoid polypectomy site with flat [place] status post biopsy and tattoo with 53m spot over 3 injections 3. Proximal sigmoid polypectomy site seen as well, 4) otherwise normal with no evidence of bleed   Hyperlipidemia with target LDL less than 70    Ischemic cardiomyopathy 05/11/2015   EF 40% on cath 05/11/2015 after anterior STEMI, EF 20-25% on echo 05/13/2015; 12/2015 =  EF ~45%.   Liver disease, chronic, with cirrhosis ([place] 11/2019   Mild possible early cirrhotic liver disease noted on CT scan.   Prediabetes Oct 2016   A1C 6.0 in Oct 2016   ST elevation myocardial infarction (STEMI) involving left anterior descending (LAD) coronary artery with complication ([place] 129/12/6211  100% Prox LAD - PCI with overlapping DES x 2   Tobacco abuse    a. 30 yrs - 1.5 ppd. - Quit 05/11/2015   Type 2 diabetes mellitus with other specified complication ([place] 30/86/5784  Vision abnormalities    Right hemianopsia   Past Surgical History:  Past Surgical History:  Procedure Laterality Date   CARDIAC CATHETERIZATION N/A 05/11/2015   Procedure: Left Heart Cath and Coronary Angiography;  Surgeon: Peter M JMartinique MD;  [facility];  Service: Cardiovascular;  100% pLAD (long lesion). RCA - prox 90%, mid 80%. OM2 & OM3 80% (OM2 & 3 not necessarily PCI targets); EF 35-45% with Anterio HK   CARDIAC CATHETERIZATION N/A 05/11/2015   Procedure: Coronary  Stent Intervention;  Surgeon: Peter M Martinique, MD;  Location: [place];  Service: Cardiovascular; PCI to Prox-Mid LAD with Overlapping Promus Premier DES 3.0 x 38 & 3.0 x 16 (post-dilated to 3.5 mm)     FLEXIBLE SIGMOIDOSCOPY N/A 05/18/2015   Procedure: FLEXIBLE SIGMOIDOSCOPY;  Surgeon: Clarene Essex, MD;  Location: [facility];  Service: Endoscopy;  Laterality: N/A;   NM MYOVIEW LTD  08/2016    EF 30% with large anterior-anteroseptal and apical infarct consistent with LAD infarct. His HIGH RISK because of large infarct. No ischemia.   S/P Appendectomy     Age 46   S/P Inguinal Hernia Repair     In his 20's.   TRANSCAROTID ARTERY REVASCULARIZATION  Left 04/30/2022   Procedure: Transcarotid Artery Revascularization;  Surgeon: Angelia Mould, MD;  Location: [facility];  Service: Vascular;  Laterality: Left;   TRANSTHORACIC ECHOCARDIOGRAM  05/15/2015   Mild LVH, EF 35-40% - Mod HK of mid-apical anteroseptal, anterior & apical walls.  Gr 1 DD. Mild bilateral Atrial Enlargement.   TRANSTHORACIC ECHOCARDIOGRAM  12/2015   EF up to ~40-45% wiht Anterior-Anteroapical HK.  Mod LV dilation with increased LVEDP.   TRANSTHORACIC ECHOCARDIOGRAM  12/'17; 4/'18   a) EF ~30-35% (in setting of HTN Urgency). - Gr 2 DD;; b) EF 30-35% (per Dr. Sallyanne Kuster - 35-40%). Anterior-anteroapical Akinesis. Gr2 DD w/ high filling pressures. Mild LA dilation. Mild RV dilation.   ULTRASOUND GUIDANCE FOR VASCULAR ACCESS Right 04/30/2022   Procedure: ULTRASOUND GUIDANCE FOR VASCULAR ACCESS;  Surgeon: Angelia Mould, MD;  Location: [facility] OR;  Service: Vascular;  Laterality: Right;    Assessment & Plan Clinical Impression: Patient is a 76 year old RH-male with history of combined CHF, L-PCA CVA with right hemiopsia, CKD III, HTN, HH/GERD, CAS who was admitted on 04/21/22 with onset of confusion and speech difficulty.  CT perfusion showed abnormal perfusion involving right temporal and left parietal lobe with extensive bilateral atherosclerotic disease, 50% left ICA stenosis and diminutive R-VA. MRI brain done revealing acute infarct in left parietal lobe with arachnoid cyst right middle cranial fossa dissecting lateral into temporal lobe.  2D echo showed EF 30-35% with global hypokinesis and akinesis of left ventricular, apical and anterior wall. Dr. Erlinda Hong felt that L-MCA infarct likely from [place] stenosis/plaque and  recommended 30 day CardioNet monitoring tor rule out A Fib on outpatient basis. ASA added to Plavix with recommends    Dr. Scot Dock consulted for input and recommended TCAR as not a good candidate for carotid endartectomy. Seroquel added due to ongoing issues with confusion and agitation but he developed sedation with this therefore this was decreased and made prn. His mood was noted to be improving with improved in cooperation, improvement in verbal output but continues to have right visual neglect with right hemianopia and weakness. He underwent left TCAR by Dr. Scot Dock on 09/26 and post op doing well. He is to continue on Plavix indefinitely and ASA discontinued today. Therapy has been working with patient who is also very [place] and CIR recommended due to functional decline.   Patient currently requires min with mobility secondary to muscle weakness, decreased cardiorespiratoy endurance, impaired timing and sequencing, motor apraxia, and decreased coordination, decreased attention to right and ideational apraxia, and decreased balance strategies.  Prior to hospitalization, patient was independent  with mobility and lived with Spouse in a [place] home.  Home access is  Level entry.  Patient will benefit from skilled PT intervention to maximize safe functional mobility,  minimize fall risk, and decrease caregiver burden for planned discharge home with intermittent assist.  Anticipate patient will benefit from follow up OP at discharge.  PT - End of Session Activity Tolerance: Tolerates 30+ min activity with multiple rests Endurance Deficit: Yes Endurance Deficit Description: Imaired activity tolerance PT Assessment Rehab Potential (ACUTE/IP ONLY): Good PT Patient demonstrates impairments in the following area(s): Balance;Perception;Endurance;Motor PT Transfers Functional Problem(s): Bed Mobility;Bed to Chair;Car PT Locomotion Functional Problem(s): Ambulation;Stairs PT Plan PT Intensity: Minimum of  1-2 x/day ,45 to 90 minutes PT Frequency: 5 out of 7 days PT Duration Estimated Length of Stay: 5-7 days PT Treatment/Interventions: Ambulation/gait training;Cognitive remediation/compensation;Discharge planning;DME/adaptive equipment instruction;Functional mobility training;Pain management;Psychosocial support;Splinting/orthotics;Therapeutic Activities;UE/LE Strength taining/ROM;Visual/perceptual remediation/compensation;Balance/vestibular training;Community reintegration;Disease management/prevention;Neuromuscular re-education;Patient/family education;Stair training;Therapeutic Exercise;UE/LE Coordination activities PT Transfers Anticipated Outcome(s): ModI with transfers with LRAD PT Locomotion Anticipated Outcome(s): ModI for community distance gait >150' with LRAD PT Recommendation Follow Up Recommendations: Outpatient PT Patient destination: Home Equipment Recommended: To be determined   PT Evaluation Precautions/Restrictions Precautions Precautions: Fall Precaution Comments: HOH - hearing aides in room Restrictions Weight Bearing Restrictions: No General   Vital Signs Pain   Pain Interference Pain Interference Pain Effect on Sleep: 2. Occasionally Pain Interference with Therapy Activities: 2. Occasionally Pain Interference with Day-to-Day Activities: 2. Occasionally Home Living/Prior Functioning Home Living Living Arrangements: Spouse/significant other Available Help at Discharge: Family;Available 24 hours/day Type of Home: Apartment Home Access: Level entry Home Layout: One level Bathroom Shower/Tub: Chiropodist: Standard Bathroom Accessibility: Yes  Lives With: Spouse Prior Function Level of Independence: Independent with basic ADLs;Independent with gait;Independent with homemaking with ambulation;Independent with transfers  Able to Take Stairs?: Yes Driving: Yes Vocation: Retired Leisure: Hobbies-yes (Comment) (Playing games onn the  computer) Vision/Perception  Vision - History Ability to See in Adequate Light: 0 Adequate Vision - Assessment Additional Comments: Patient able to track pen, however loses the pen in the R upper and lower quadrant. Patient noted to have peripheral field cuts bilaterally with L gaze preference and L head turn. Unable to consistetly follow commands limiting accuracy of vision assessment. Perception Perception: Impaired Praxis Praxis: Impaired Praxis Impairment Details: Ideation;Motor planning;Initiation;Ideomotor  Cognition Overall Cognitive Status: Impaired/Different from baseline Arousal/Alertness: Awake/alert Orientation Level: Oriented to person;Oriented to place Year: 2023 Month: August Day of Week: Incorrect Attention: Sustained Focused Attention: Appears intact Sustained Attention: Impaired Sustained Attention Impairment: Verbal basic;Functional basic Memory: Impaired Memory Impairment: Storage deficit;Retrieval deficit;Decreased recall of new information Awareness: Impaired Awareness Impairment: Intellectual impairment Problem Solving: Impaired Problem Solving Impairment: Verbal basic;Functional basic Safety/Judgment: Impaired Sensation Sensation Light Touch: Appears Intact Hot/Cold: Appears Intact Coordination Gross Motor Movements are Fluid and Coordinated: No Fine Motor Movements are Fluid and Coordinated: No Coordination and Movement Description: Decreased coordination with under and overshooting noted- Decreased ability to comprehend one-step commands limiting his ability to perform the below tests Finger Nose Finger Test: Under/overshooting bilaterally R>L Heel Shin Test: Able to tap R foot to left shin, however unable to slide up/down despite demonstration and tactilce cues. Motor  Motor Motor: Motor apraxia Motor - Skilled Clinical Observations: Impaired coordination   Trunk/Postural Assessment  Cervical Assessment Cervical Assessment: Exceptions to [facility]  (forward head) Thoracic Assessment Thoracic Assessment: Exceptions to [facility] (Rounded shoulders and R shoulder depression) Lumbar Assessment Lumbar Assessment: Exceptions to [facility] (Posterior pelvic tilt) Postural Control Postural Control: Deficits on evaluation (Delayed)  Balance Balance Balance Assessed: Yes Standardized Balance Assessment Standardized Balance Assessment: Berg Balance Test Berg Balance Test Sit to Stand: Able to stand  independently using hands Standing Unsupported: Able to stand 2 minutes with supervision Sitting with Back Unsupported but Feet Supported on Floor or Stool: Able to sit safely and securely 2 minutes Stand to Sit: Sits safely with minimal use of hands Transfers: Able to transfer safely, minor use of hands Standing Unsupported with Eyes Closed: Able to stand 10 seconds with supervision Standing Ubsupported with Feet Together: Able to place feet together independently and stand for 1 minute with supervision From Standing, Reach Forward with Outstretched Arm: Can reach forward >12 cm safely (5") From Standing Position, Pick up Object from Floor: Able to pick up shoe, needs supervision From Standing Position, Turn to Look Behind Over each Shoulder: Looks behind one side only/other side shows less weight shift Turn 360 Degrees: Able to turn 360 degrees safely but slowly Standing Unsupported, Alternately Place Feet on Step/Stool: Able to complete >2 steps/needs minimal assist Standing Unsupported, One Foot in Front: Able to take small step independently and hold 30 seconds Standing on One Leg: Tries to lift leg/unable to hold 3 seconds but remains standing independently Total Score: 39 Static Sitting Balance Static Sitting - Balance Support: Feet supported;Bilateral upper extremity supported Static Sitting - Level of Assistance: 6: Modified independent (Device/Increase time) Dynamic Sitting Balance Dynamic Sitting - Balance Support: Feet supported;During  functional activity Dynamic Sitting - Level of Assistance: 6: Modified independent (Device/Increase time) Static Standing Balance Static Standing - Balance Support: No upper extremity supported Static Standing - Level of Assistance:  (CGA) Dynamic Standing Balance Dynamic Standing - Balance Support: During functional activity;No upper extremity supported Dynamic Standing - Level of Assistance: 4: Min assist Extremity Assessment      RLE Assessment RLE Assessment: Exceptions to [facility] General Strength Comments: Unable to perform a formal MMT secondary to receptive aphasia with difficuty following/understanding one-step commands; Grossly 3+/5 LLE Assessment LLE Assessment: Exceptions to [facility] General Strength Comments: Unable to perform a formal MMT secondary to receptive aphasia with difficuty following/understanding one-step commands; Grossly 4/5  Care Tool Care Tool Bed Mobility Roll left and right activity   Roll left and right assist level: Contact Guard/Touching assist    Sit to lying activity   Sit to lying assist level: Contact Guard/Touching assist    Lying to sitting on side of bed activity   Lying to sitting on side of bed assist level: the ability to move from lying on the back to sitting on the side of the bed with no back support.: Contact Guard/Touching assist     Care Tool Transfers Sit to stand transfer   Sit to stand assist level: Contact Guard/Touching assist    Chair/bed transfer   Chair/bed transfer assist level: Contact Guard/Touching assist     Physiological scientist transfer assist level: Contact Guard/Touching assist      Care Tool Locomotion Ambulation   Assist level: Minimal Assistance - Patient > 75% Assistive device: No Device Max distance: 150+  Walk 10 feet activity   Assist level: Contact Guard/Touching assist Assistive device: No Device   Walk 50 feet with 2 turns activity   Assist level: Minimal Assistance - Patient >  75% Assistive device: No Device  Walk 150 feet activity   Assist level: Minimal Assistance - Patient > 75% Assistive device: No Device  Walk 10 feet on uneven surfaces activity   Assist level: Minimal Assistance - Patient > 75% Assistive device: Other (comment) (No AD)  Stairs   Assist  level: Contact Guard/Touching assist Stairs assistive device: 2 hand rails Max number of stairs: 12  Walk up/down 1 step activity   Walk up/down 1 step (curb) assist level: Contact Guard/Touching assist Walk up/down 1 step or curb assistive device: 2 hand rails  Walk up/down 4 steps activity   Walk up/down 4 steps assist level: Contact Guard/Touching assist Walk up/down 4 steps assistive device: 2 hand rails  Walk up/down 12 steps activity   Walk up/down 12 steps assist level: Contact Guard/Touching assist Walk up/down 12 steps assistive device: 2 hand rails  Pick up small objects from floor   Pick up small object from the floor assist level: Contact Guard/Touching assist Pick up small object from the floor assistive device: No AD  Wheelchair Is the patient using a wheelchair?: No          Wheel 50 feet with 2 turns activity      Wheel 150 feet activity        Refer to Care Plan for Long Term Goals  SHORT TERM GOAL WEEK 1 PT Short Term Goal 1 (Week 1): STG=LTG secondary to ELOS  Recommendations for other services: None   Skilled Therapeutic Intervention Mobility Bed Mobility Bed Mobility: Rolling Right;Rolling Left;Supine to Sit;Sit to Supine Rolling Right: Contact Guard/Touching assist Rolling Left: Contact Guard/Touching assist Supine to Sit: Supervision/Verbal cueing Sit to Supine: Supervision/Verbal cueing Transfers Transfers: Sit to Stand;Stand to Sit;Stand Pivot Transfers Sit to Stand: Contact Guard/Touching assist Stand to Sit: Contact Guard/Touching assist Stand Pivot Transfers: Contact Guard/Touching assist Transfer (Assistive device): None Locomotion  Gait Ambulation:  Yes Gait Assistance: Minimal Assistance - Patient > 75% Gait Distance (Feet): 150 Feet Assistive device: None Gait Assistance Details: Verbal cues for precautions/safety Gait Gait: Yes Gait Pattern: Lateral trunk lean to right;Step-through pattern Stairs / Additional Locomotion Stairs: Yes Stairs Assistance: Contact Guard/Touching assist Stair Management Technique: Two rails Number of Stairs: 12 Height of Stairs: 6 Ramp: Minimal Assistance - Patient >75% Curb: Nurse, mental health Mobility: No  Skilled Intervention- Patient greeted supine asleep in bed and difficult to arouse, however eventually able to open his eyes and agreeable to PT treatment session. Patient's spouse present in room and all questions answered.   Patient performed all functional mobility with CGA/MinA without the use of an assistive device. Therapist stood on R side of patient providing tactile and verbal cues for directions. Patient presents with L-sided gaze preference and inability to consistently follow one-step commands throughout treatment session, despite demonstration and hand-over-hand assistance. Inconsistencies noted with vision/perception- Will continue to address throughout continued treatment sessions.   Patient left supine in bed with bed alarm on, call bell within reach, spouse present and all needs met. Safety plan updated prior to therapist left the room.    Discharge Criteria: Patient will be discharged from PT if patient refuses treatment 3 consecutive times without medical reason, if treatment goals not met, if there is a change in medical status, if patient makes no progress towards goals or if patient is discharged from hospital.  The above assessment, treatment plan, treatment alternatives and goals were discussed and mutually agreed upon: by patient and by family  Dominica  Lakendra Helling 05/02/2022, 12:37 PM

## 2022-05-02 NOTE — Evaluation (Signed)
Speech Language Pathology Assessment and Plan  Patient Details  Name: Damon Walker MRN: [id number] Date of Birth: 04-Apr-1946  SLP Diagnosis: Aphasia;Cognitive Impairments  Rehab Potential: Good ELOS: 5-7 days   Today's Date: 05/02/2022 SLP Individual Time: 0349-1791 SLP Individual Time Calculation (min): 11 min  Hospital Problem: Principal Problem:   Acute ischemic left middle cerebral artery (MCA) stroke ([place])  Past Medical History:  Past Medical History:  Diagnosis Date   Carotid arterial disease ([place]) 06/2014; 01/09/2015   Followed by Dr. Donnetta Hutching of [facility] -- a) Carotid u/s: 40-59% bilat ICA stenosis; b) 10/2019: Carotid Dopplers October 20, 5054: R ICA 9-79%, LICA 48-01%.  Right vertebral artery appears occluded.  Normal subclavian flow.  Normal left vertebral artery. -->  No change since 2020   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 ([place]) 05/14/2015   -- Given TPA.  MRI Brain: Acute L Occipital Lobe Infarct. Chronic microvascular ischemic changes in the white matter and left pons.  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   Chronic combined systolic and diastolic congestive heart failure, NYHA class 2 ([place]) 05/11/2015 - 05/15/15   a. EF 20-25% with anterior-anteroseptal akinesis (immediately post anterior STEMI).  ; b. 10/10/'16 Echo: EF 35-40% with moderate mid-apical anteroseptal, anterior and apical hypokinesis.   Coronary artery disease involving native heart 05/11/2015   Cath: 3V CAD with 90% prox RCA, 80% mid RCA, 80% OM3, (RCA and OM residual treated medically) 100% LAD - PCI to Prox-Mid LAD with Overlapping Promus Premier DES 3.0 x 38 & 3.0 x 16 (post-dilated to 3.5 mm)    Essential hypertension    H/O: GI bleed 05/15/2015   s/p Sigmoid Polypectomy 05/04/2015 - prior to STEMI on May 11, 2015; Had GI  Bleed following TPA for CVA, while on ASA & Brilinta; Flex Sig - 1. Internal Hemorrhoids, 2. Distal sigmoid polypectomy site with flat [place] status post biopsy and tattoo with 55m spot over 3 injections 3. Proximal sigmoid polypectomy site seen as well, 4) otherwise normal with no evidence of bleed   Hyperlipidemia with target LDL less than 70    Ischemic cardiomyopathy 05/11/2015   EF 40% on cath 05/11/2015 after anterior STEMI, EF 20-25% on echo 05/13/2015; 12/2015 =  EF ~45%.   Liver disease, chronic, with cirrhosis ([place] 11/2019   Mild possible early cirrhotic liver disease noted on CT scan.   Prediabetes Oct 2016   A1C 6.0 in Oct 2016   ST elevation myocardial infarction (STEMI) involving left anterior descending (LAD) coronary artery with complication ([place] 165/12/3746  100% Prox LAD - PCI with overlapping DES x 2   Tobacco abuse    a. 30 yrs - 1.5 ppd. - Quit 05/11/2015   Type 2 diabetes mellitus with other specified complication ([place] 32/70/7867  Vision abnormalities    Right hemianopsia   Past Surgical History:  Past Surgical History:  Procedure Laterality Date   CARDIAC CATHETERIZATION N/A 05/11/2015   Procedure: Left Heart Cath and Coronary Angiography;  Surgeon: Peter M JMartinique MD;  [facility];  Service: Cardiovascular;  100% pLAD (long lesion). RCA - prox 90%, mid 80%. OM2 & OM3 80% (OM2 & 3 not necessarily PCI targets); EF 35-45% with Anterio HK   CARDIAC CATHETERIZATION N/A 05/11/2015   Procedure: Coronary Stent Intervention;  Surgeon: Peter M JMartinique MD;  [facility];  Service: Cardiovascular; PCI to Prox-Mid LAD with Overlapping Promus Premier DES 3.0 x 38 & 3.0 x 16 (post-dilated to 3.5 mm)     FLEXIBLE SIGMOIDOSCOPY N/A 05/18/2015   Procedure: FLEXIBLE SIGMOIDOSCOPY;  Surgeon: Clarene Essex, MD;  Location: [place];  Service: Endoscopy;  Laterality: N/A;   NM MYOVIEW LTD  08/2016   EF 30% with large anterior-anteroseptal and apical infarct  consistent with LAD infarct. His HIGH RISK because of large infarct. No ischemia.   S/P Appendectomy     Age 76   S/P Inguinal Hernia Repair     In his 20's.   TRANSCAROTID ARTERY REVASCULARIZATION  Left 04/30/2022   Procedure: Transcarotid Artery Revascularization;  Surgeon: Angelia Mould, MD;  Location: [facility];  Service: Vascular;  Laterality: Left;   TRANSTHORACIC ECHOCARDIOGRAM  05/15/2015   Mild LVH, EF 35-40% - Mod HK of mid-apical anteroseptal, anterior & apical walls.  Gr 1 DD. Mild bilateral Atrial Enlargement.   TRANSTHORACIC ECHOCARDIOGRAM  12/2015   EF up to ~40-45% wiht Anterior-Anteroapical HK.  Mod LV dilation with increased LVEDP.   TRANSTHORACIC ECHOCARDIOGRAM  12/'17; 4/'18   a) EF ~30-35% (in setting of HTN Urgency). - Gr 2 DD;; b) EF 30-35% (per Dr. Sallyanne Kuster - 35-40%). Anterior-anteroapical Akinesis. Gr2 DD w/ high filling pressures. Mild LA dilation. Mild RV dilation.   ULTRASOUND GUIDANCE FOR VASCULAR ACCESS Right 04/30/2022   Procedure: ULTRASOUND GUIDANCE FOR VASCULAR ACCESS;  Surgeon: Angelia Mould, MD;  Location: [place];  Service: Vascular;  Laterality: Right;    Assessment / Plan / Recommendation Clinical Impression  Damon Walker is a 76 year old RH-male with history of combined CHF, L-PCA CVA with right hemiopsia, CKD III, HTN, HH/GERD, CAS who was admitted on 04/21/22 with onset of confusion and speech difficulty.  CT perfusion showed abnormal perfusion involving right temporal and left parietal lobe with extensive bilateral atherosclerotic disease, 50% left ICA stenosis and diminutive R-VA. MRI brain done revealing acute infarct in left parietal lobe with arachnoid cyst right middle cranial fossa dissecting lateral into temporal lobe. Seroquel added due to ongoing issues with confusion and agitation but he developed sedation with this therefore this was decreased and made prn. His mood was noted to be improving with improved in cooperation,  improvement in verbal output but continues to have right visual neglect with right hemianopia and weakness. Therapy has been working with patient who is also very [place] and CIR recommended due to functional decline.   Pt presents to speech-language-cognitive evaluation accompanied by spouse. Pt very hard of hearing with presence of HAs during session. Spouse denied any baseline cognitive-communication deficits from prior CVA. Through formal and informal testing, pt presents with severe-to-profound language impairment resulting in receptive > expressive aphasia. Pt exhibited absent awareness of communication deficits with minimal safety awareness. Spouse denied any chewing/swallowing difficulties with pt's current diet. Pt observed consuming whole pill with thin liquids without overt s/sx of aspiration and clear vocal quality post swallows.   The Western Aphasia Battery Bedside version (WAB-B) was administered.  Pt scored the following re: spontaneous speech content 5/10, fluency 5/10, auditory comp (yes/no) 2/10, following sequential commands 0/10, verbal repetition 2/10, object naming 2/10. Results suggestive of possible wernicke's aphasia although difficult to assess due to limited verbal output for differential diagnosis. When pt did verbally communicate, it was often at the word or short sentence level with clear speech that was not suggestive of dysarthria. Object naming task consisted of speech errors such  as semantic paraphasias, neologisms, and perseveration.   Pt was not stimulable to skilled multimodal cueing nor cueing hierarchy due to severity of receptive deficits. For example, pt was unable to follow 1-step commands, and very limited responses to basic yes/no questions. SLP wrote commands on paper with large print font. Pt read aloud with 100% accuracy however did not understand concept of actually executing command. Over-exaggerated visual cues were not beneficial in offering any level of  clarification. Pt made almost no attempts to look at therapist despite extensive max verbal/visual/tactile cueing, essentially being unable to direct pt's attention to most tasks. Pt followed commands better in a functional context (e.g., walking to therapy room) which was likely attributed to following therapist's lead. It is possible significant visual and hearing impairment are impacting engagement in therapeutic tasks.   Skilled SLP intervention is recommended to address cognitive-linguistic and language deficits. To maximize functional independence and safety within pt's natural environment. Recommend 24 hour supervision at discharge, as well as follow up SLP services in [facility] or OP setting.    Skilled Therapeutic Interventions          Speech-language-cognitive evaluations completed. Please see above.   SLP Assessment  Patient will need skilled [facility] during CIR admission    Recommendations  Oral Care Recommendations: Oral care BID Patient destination: Home Follow up Recommendations: 24 hour supervision/assistance;Home Health SLP;Outpatient SLP Equipment Recommended: None recommended by SLP    SLP Frequency 3 to 5 out of 7 days   SLP Duration  SLP Intensity  SLP Treatment/Interventions 5-7 days  Minumum of 1-2 x/day, 30 to 90 minutes  Cueing hierarchy;Patient/family education;Therapeutic Activities;Internal/external aids;Multimodal communication approach;Speech/Language facilitation    Pain    Prior Functioning Cognitive/Linguistic Baseline: Within functional limits Type of Home: Apartment  Lives With: Spouse Available Help at Discharge: Family;Available 24 hours/day Vocation: Retired  Programmer, systems Overall Cognitive Status: Impaired/Different from baseline Arousal/Alertness: Awake/alert Orientation Level: Oriented to person Year: 2023 Month: August Day of Week: Incorrect Attention: Sustained Focused Attention: Appears  intact Focused Attention Impairment: Verbal basic Sustained Attention: Impaired Sustained Attention Impairment: Verbal basic;Functional basic Memory: Impaired Memory Impairment: Storage deficit;Retrieval deficit;Decreased recall of new information Awareness: Impaired Awareness Impairment: Intellectual impairment Problem Solving: Impaired Problem Solving Impairment: Verbal basic;Functional basic Behaviors: Impulsive Safety/Judgment: Impaired  Comprehension Auditory Comprehension Overall Auditory Comprehension: Impaired Yes/No Questions: Impaired Basic Biographical Questions: 0-25% accurate (10%) Basic Immediate Environment Questions: 0-24% accurate (0%) Complex Questions: 0-24% accurate (0%) Commands: Impaired One Step Basic Commands: 0-24% accurate (0%) Interfering Components: Hearing;Visual impairments;Motor planning EffectiveTechniques: Repetition;Stressing words;Increased volume Visual Recognition/Discrimination Discrimination: Not tested Reading Comprehension Reading Status: Impaired Word level: Within functional limits (required additional time) Sentence Level: Impaired Interfering Components: Visual acuity Effective Techniques: Large print Expression Expression Primary Mode of Expression: Verbal Verbal Expression Overall Verbal Expression: Impaired Initiation: Impaired Level of Generative/Spontaneous Verbalization: Phrase;Word Repetition: Impaired Level of Impairment: Word level Naming: Impairment Responsive: 0-25% accurate Confrontation: Impaired Convergent: 0-24% accurate Divergent: 0-24% accurate Verbal Errors: Neologisms;Perseveration;Not aware of errors;Semantic paraphasias Pragmatics: Impairment Impairments: Eye contact Interfering Components: Attention Effective Techniques:  (pt not stimulable to cueing) Written Expression Dominant Hand: Right Written Expression: Exceptions to [facility] Self Formulation Ability: Letter Oral Motor Oral Motor/Sensory  Function Overall Oral Motor/Sensory Function: Within functional limits Motor Speech Overall Motor Speech: Appears within functional limits for tasks assessed Respiration: Within functional limits Resonance: Within functional limits Articulation: Within functional limitis Intelligibility: Intelligible Motor Planning: Witnin functional limits Motor Speech Errors: Not applicable  Care Tool Care  Tool Cognition Ability to hear (with hearing aid or hearing appliances if normally used Ability to hear (with hearing aid or hearing appliances if normally used): 2. Moderate difficulty - speaker has to increase volume and speak distinctly   Expression of Ideas and Wants Expression of Ideas and Wants: 2. Frequent difficulty - frequently exhibits difficulty with expressing needs and ideas   Understanding Verbal and Non-Verbal Content Understanding Verbal and Non-Verbal Content: 1. Rarely/never understands  Memory/Recall Ability Memory/Recall Ability : That he or she is in a hospital/hospital unit   Short Term Goals: Week 1: SLP Short Term Goal 1 (Week 1): STG-LTG due to ELOS  Refer to Care Plan for Long Term Goals  Recommendations for other services: None   Discharge Criteria: Patient will be discharged from SLP if patient refuses treatment 3 consecutive times without medical reason, if treatment goals not met, if there is a change in medical status, if patient makes no progress towards goals or if patient is discharged from hospital.  The above assessment, treatment plan, treatment alternatives and goals were discussed and mutually agreed upon: by patient and by family  Patty Sermons 05/02/2022, 5:23 PM

## 2022-05-02 NOTE — Progress Notes (Addendum)
PROGRESS NOTE   Subjective/Complaints: Pt appears to have slept last night? Wife states that at home he usually wakes up in the middle of the night and stays up for a bit piddling. Takes "naps". Doesn't wake up very early in the morning.   ROS: Limited due to cognitive/behavioral    Objective:   Structural Heart Procedure  Result Date: 05/02/2022 See surgical note for result.  Recent Labs    05/01/22 0635 05/02/22 0649  WBC 12.1* 7.9  HGB 11.8* 12.0*  HCT 34.3* 35.1*  PLT 210 197   Recent Labs    05/01/22 0635 05/02/22 0649  NA 140 139  K 3.7 4.1  CL 108 110  CO2 24 24  GLUCOSE 129* 96  BUN 16 13  CREATININE 1.08 1.01  CALCIUM 8.4* 8.3*    Intake/Output Summary (Last 24 hours) at 05/02/2022 1034 Last data filed at 05/01/2022 2308 Gross per 24 hour  Intake 120 ml  Output --  Net 120 ml        Physical Exam: Vital Signs Blood pressure (!) 141/49, pulse (!) 55, temperature 98 F (36.7 C), resp. rate 16, height '5\' 8"'$  (1.727 m), weight 88.7 kg, SpO2 99 %.  General: pt somnolent.  No apparent distress HEENT: Head is normocephalic, atraumatic, PERRLA, EOMI, sclera anicteric, oral mucosa pink and moist, dentition intact, ext ear canals clear,  Neck: Supple without JVD or lymphadenopathy Heart: Reg rate and rhythm. No murmurs rubs or gallops Chest: CTA bilaterally without wheezes, rales, or rhonchi; no distress Abdomen: Soft, non-tender, non-distended, bowel sounds positive. Extremities: No clubbing, cyanosis, or edema. Pulses are 2+ Psych: Pt's affect is appropriate. Pt is cooperative Skin: mild right antecubital erythema from old IV. Neuro:  pt is slow to arouse. Moves all 4 limbs with tactile cues. Really didn't open eyes for me. Intermittently followed one step commands but is HOH. Seems to withdraw from pain in all 4 limbs. No resting abnl tone. Non-verbal Musculoskeletal: appears to be able fully range  all 4 limbs.     Assessment/Plan: 1. Functional deficits which require 3+ hours per day of interdisciplinary therapy in a comprehensive inpatient rehab setting. Physiatrist is providing close team supervision and 24 hour management of active medical problems listed below. Physiatrist and rehab team continue to assess barriers to discharge/monitor patient progress toward functional and medical goals  Care Tool:  Bathing              Bathing assist       Upper Body Dressing/Undressing Upper body dressing        Upper body assist      Lower Body Dressing/Undressing Lower body dressing            Lower body assist       Toileting Toileting    Toileting assist       Transfers Chair/bed transfer  Transfers assist           Locomotion Ambulation   Ambulation assist              Walk 10 feet activity   Assist           Walk 50 feet activity  Assist           Walk 150 feet activity   Assist           Walk 10 feet on uneven surface  activity   Assist           Wheelchair     Assist               Wheelchair 50 feet with 2 turns activity    Assist            Wheelchair 150 feet activity     Assist          Blood pressure (!) 141/49, pulse (!) 55, temperature 98 F (36.7 C), resp. rate 16, height '5\' 8"'$  (1.727 m), weight 88.7 kg, SpO2 99 %.  Medical Problem List and Plan: 1. 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 Needs hearing aids in for therapy!  -Patient is beginning CIR therapies today including PT, OT, and SLP  2.  Antithrombotics: -DVT/anticoagulation:  Pharmaceutical: Lovenox             -antiplatelet therapy: ASA d/c today and to continue Plavix.  3. Pain Management: Oxycodone prn.  4. Mood/Behavior/Sleep: LCSW to follow for  evaluation and support.              -antipsychotic agents: Seroquel prn for agitation.              --Per family--gets up at  3 am then sleeps till late am and off/on in daytime.   -will check sleep chart and begin scheduled trazodone for sleep to help better normalize his sleep-wake cycle. May be hard to change however. 5. Neuropsych/cognition: This patient is not fully capable of making decisions on his own behalf. 6. Skin/Wound Care: Routine pressure relief measures.  7. Fluids/Electrolytes/Nutrition: encourage PO  -I personally reviewed the patient's labs today.    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  9. Delirium: Resolving. Continue Seroquel prn as really sedated him --hx confusion with last stroke. See above 10. Chronic combined CHF:    --Monitor for signs of overload.             --Continue Coreg, Plavix and Crestor.              --add Cardiac restrictions to diet.   -check daily weights Filed Weights   05/01/22 1819  Weight: 88.7 kg    11. CKD III: Monitor with serial checks. Improved with continuous fluids on board.             --d/c fluids to avoid overload. CXR with mild pulmonary edema pre-op.    -admit labs reviewed and stable 12. HTN: Monitor BP TID--on coreg.   -fair control.  Monitor for pattern 13. Pre-diabetes: Hgb A1C-5.8. Add CM restrictions to diet.              --RD consult for dietary education.  14. HH/GERD: Managed --alternates  Protonix with Pepcid every other day.  15. Cellulitis Right forearm: Secondary to infiltrated IV--started 2-3 days ago per wife. --started warm compresses and doxycycline for treatment.  -9/28 signs of improvement today    LOS: 1 days A FACE TO FACE EVALUATION WAS  PERFORMED  Meredith Staggers 05/02/2022, 10:34 AM

## 2022-05-03 DIAGNOSIS — R7303 Prediabetes: Secondary | ICD-10-CM

## 2022-05-03 DIAGNOSIS — E440 Moderate protein-calorie malnutrition: Secondary | ICD-10-CM

## 2022-05-03 DIAGNOSIS — G4700 Insomnia, unspecified: Secondary | ICD-10-CM

## 2022-05-03 MED ORDER — GLUCERNA SHAKE PO LIQD
237.0000 mL | ORAL | Status: DC
  Administered 2022-05-05 – 2022-05-07 (×3): 237 mL via ORAL

## 2022-05-03 MED ORDER — CARVEDILOL 6.25 MG PO TABS
6.2500 mg | ORAL_TABLET | Freq: Two times a day (BID) | ORAL | Status: DC
  Administered 2022-05-03 – 2022-05-08 (×9): 6.25 mg via ORAL
  Filled 2022-05-03 (×10): qty 1

## 2022-05-03 NOTE — Progress Notes (Signed)
Physical Therapy Session Note  Patient Details  Name: Damon Walker MRN: [id number] Date of Birth: Apr 11, 1946  Today's Date: 05/03/2022 PT Individual Time: 1440-1506 PT Individual Time Calculation (min): 26 min   Short Term Goals: Week 1:  PT Short Term Goal 1 (Week 1): STG=LTG secondary to ELOS    Skilled Therapeutic Interventions/Progress Updates:   Pt received supine in bed and agreeable to PT. Supine>sit transfer with supervision assist and cues for participation. Gait training through hall 135f, 1363fand 15023fith supervision assist for safety with cues for direction and visual scanning to prevent hitting door frames.   Nustep reciprocal moement training and sustained attention task x 5 min. Pt unabl eto recall target completion time despite cues from PT. .  Marland KitchenPeg board puzzle low and medium difficulty. Completed with mod-max cues for sequencing and visual scanning to complete lower half of puzzles ans well  error detection on both puzzles. Supervision assist for balance throughout completion of puzzle.     Patient returned to room and left sitting in WC [facility] call bell in reach and all needs met.        Therapy Documentation Precautions:  Precautions Precautions: Fall Precaution Comments: HOH - hearing aides in room Restrictions Weight Bearing Restrictions: No  Vital Signs: Therapy Vitals Temp: 98.1 F (36.7 C) Pulse Rate: (!) 52 Resp: 16 BP: (!) 110/52 Patient Position (if appropriate): Lying Oxygen Therapy SpO2: 96 % O2 Device: Room Air Pain: Pain Assessment Pain Scale: 0-10 Pain Score: 0-No pain     Therapy/Group: Individual Therapy  AusLorie Phenix29/2023, 3:37 PM

## 2022-05-03 NOTE — Progress Notes (Signed)
Initial Nutrition Assessment  DOCUMENTATION CODES:  Non-severe (moderate) malnutrition in context of acute illness/injury  INTERVENTION:  -Liberalize diet to carb modified -Provide Glucerna TID as tolerated   NUTRITION DIAGNOSIS:  Moderate Malnutrition related to acute illness as evidenced by energy intake < 75% for > 7 days, mild fat depletion, mild muscle depletion.  GOAL:  Patient will meet greater than or equal to 90% of their needs  MONITOR:  PO intake, Supplement acceptance  REASON FOR ASSESSMENT:  Consult Diet education (noted poor intake at education, full assessment completed)  ASSESSMENT:  Pt is a 76yo M with PMH of CHF, STEMI, HLD,  L-PCA CVA with right hemiopsia, CKD III, HTN, DM, liver disease with cirrhosis, HH/GERD, CAS who was admitted to the hospital on 04/21/22 with onset of confusion and speech difficulty. Now presents for rehab.   Visited pt and wife at bedside this afternoon. Wife reports pt has not been eating well for the last 2 weeks during admission, but notes he did much better today. Wife reports today pt had a banana, mini muffins, pot roast and ice cream. Prior pt was eating just bites of food. She reports pt may drink 1 Glucerna/day, currently ordered TID. Contacted RN to determine acceptance. Glucerna will provide 220kcal and 10g protein/bottle. Completed nutrition focused physical exam showing mild fat loss and mild muscle wasting. Per report, pt consuming <75% estimated nutrient needs within the last 2 weeks. Pt meets ASPEN criteria for moderate protein calorie malnutrition. Recommend liberalizing diet to carb modified only during admission to allow more menu options. Note BP low. Menu options during admission will be low in fat without heart healthy restriction.  NUTRITION - FOCUSED PHYSICAL EXAM:  Flowsheet Row Most Recent Value  Orbital Region Mild depletion  Upper Arm Region Unable to assess  [in sweatpants, sweatshirt and multiple undershirts]   Thoracic and Lumbar Region Unable to assess  Buccal Region Mild depletion  Temple Region Moderate depletion  Clavicle Bone Region Mild depletion  Clavicle and Acromion Bone Region No depletion  Scapular Bone Region Unable to assess  Dorsal Hand Mild depletion  Patellar Region Unable to assess  Anterior Thigh Region Unable to assess  Posterior Calf Region Mild depletion  Hair Reviewed  Eyes Reviewed  Mouth Reviewed  Skin Reviewed  Nails Reviewed      Diet Order:   Diet Order             Diet Carb Modified Fluid consistency: Thin; Room service appropriate? Yes  Diet effective now                   EDUCATION NEEDS:   Education needs have been addressed  Skin:  Skin Assessment: Reviewed RN Assessment  Last BM:  9/29  Height:  Ht Readings from Last 1 Encounters:  05/01/22 '5\' 8"'$  (1.727 m)    Weight:  Wt Readings from Last 1 Encounters:  05/03/22 87.6 kg    BMI:  Body mass index is 29.36 kg/m.  Estimated Nutritional Needs:   Kcal:  2190-2600kcal  Protein:  85-105g  Fluid:  2126m  KCandise Bowens MS, RD, LDN, CNSC See AMiON for contact information

## 2022-05-03 NOTE — Progress Notes (Signed)
Occupational Therapy Session Note  Patient Details  Name: Damon Walker MRN: [id number] Date of Birth: 09/06/1945  Today's Date: 05/03/2022 OT Individual Time: 5883-2549 OT Individual Time Calculation (min): 60 min    Short Term Goals: Week 1:  OT Short Term Goal 1 (Week 1): LTG=STG 2/2 ELOS  Skilled Therapeutic Interventions/Progress Updates:    OT session focused on caregiver education, cognitive remediation, and self-care tasks. Pt completed dressing with set up assist as wife had placed clothing items out. Encouraged allowing pt to assist with organization and problem solving to identify clothing items each day. Engaged in pipe tree puzzles (Design 1 and 5) with pt requiring total A to initiate. On first puzzle, pt required OT to present only the pieces required for puzzle then he was able to complete with increased time. On second puzzle, OT presented all pieces. Pt noted to show decreased attention and visual memory to task, requiring OT to highlight each piece in picture that he was looking for individually. At end of session, pt returned to supine in bed. Of note, pt with brighter affect today as reported by caregiver.   Therapy Documentation Precautions:  Precautions Precautions: Fall Precaution Comments: HOH - hearing aides in room Restrictions Weight Bearing Restrictions: No General:   Vital Signs:  Pain: Pain Assessment Pain Scale: 0-10 Pain Score: 0-No pain ADL: ADL Eating: Set up, Moderate cueing, Supervision/safety Grooming: Setup, Moderate cueing, Supervision/safety Upper Body Bathing: Supervision/safety, Minimal cueing Lower Body Bathing: Minimal assistance Upper Body Dressing: Supervision/safety, Minimal cueing Lower Body Dressing: Minimal assistance Toileting: Minimal assistance Toilet Transfer: Minimal assistance Tub/Shower Transfer: Minimal assistance Vision   Perception    Praxis   Balance   Exercises:   Other Treatments:      Therapy/Group: Individual Therapy  Duayne Cal 05/03/2022, 11:53 AM

## 2022-05-03 NOTE — Progress Notes (Addendum)
Speech Language Pathology Daily Session Note  Patient Details  Name: Damon Walker MRN: [id number] Date of Birth: 06/13/1946  Today's Date: 05/03/2022 SLP Individual Time: 1035-1115 SLP Individual Time Calculation (min): 40 min  Short Term Goals: Week 1: SLP Short Term Goal 1 (Week 1): STG-LTG due to ELOS  Skilled Therapeutic Interventions: Skilled ST treatment focused on language goals. Pt was greeted in bed and accompanied by spouse on arrival. Hearing aids in place. SLP facilitated object naming task using stimuli from LARK kit with 62% accuracy at independent level, progressing to 75% accuracy with min A verbal cues. Speech errors consisted of semantic paraphasias and neologisms without awareness. Pt again not stimulable to cueing hierarchy including multimodal cues.   SLP facilitated object ID in field of 2 using LARK kit stimuli with 72% accuracy with min A verbal cues, progressing to 85% given max A multimodal cues. Pt exhibited difficulty comprehending task directives including "point to" or "show me" and rather named both objects. Pt eventually comprehended with max A multimodal and demonstration cues.  SLP facilitated automatic speech tasks including counting 1-10, reciting days of the week, and months of the year with max A verbal cues. Pt exhibited interrupted and/or out-of-order sequencing and appeared to get confused with task directives mid task with inability to repair.   SLP provided education to spouse throughout session regarding communication strategies to support attention to task, functional expression, and comprehension. Also provided examples and demonstration on HEP ideas spouse could facilitate using pictures of family members. Spouse verbalized understanding through teach back and wrote down information in notebook.   Of note, pt was easily distractible to external stimuli from TV which significantly improved when TV was turned off.   Patient was left in bed with  alarm activated and immediate needs within reach at end of session. Continue per current plan of care.       Pain Pain Assessment Pain Scale: 0-10 Pain Score: 0-No pain  Therapy/Group: Individual Therapy   T  05/03/2022, 3:52 PM 

## 2022-05-03 NOTE — Progress Notes (Signed)
PROGRESS NOTE   Subjective/Complaints: No new concerns. He reports chronically decreased sleep at night, wife says he usually wakes up in middle of night at home.  BP was a little low last night.   ROS: Limited due to cognitive/behavioral    Objective:   No results found. Recent Labs    05/01/22 0635 05/02/22 0649  WBC 12.1* 7.9  HGB 11.8* 12.0*  HCT 34.3* 35.1*  PLT 210 197    Recent Labs    05/01/22 0635 05/02/22 0649  NA 140 139  K 3.7 4.1  CL 108 110  CO2 24 24  GLUCOSE 129* 96  BUN 16 13  CREATININE 1.08 1.01  CALCIUM 8.4* 8.3*     Intake/Output Summary (Last 24 hours) at 05/03/2022 1524 Last data filed at 05/03/2022 1245 Gross per 24 hour  Intake 354 ml  Output --  Net 354 ml         Physical Exam: Vital Signs Blood pressure (!) 110/52, pulse (!) 52, temperature 98.1 F (36.7 C), resp. rate 16, height '5\' 8"'$  (1.727 m), weight 87.6 kg, SpO2 96 %.  General: Alert, No apparent distress HEENT: MMM, conjugate gaze Neck: Supple without JVD or lymphadenopathy Heart: Reg rate and rhythm. No murmurs rubs or gallops Chest: CTA bilaterally without wheezes, rales, or rhonchi; no distress Abdomen: Soft, non-tender, non-distended, bowel sounds positive. Extremities: No clubbing, cyanosis, or edema. Pulses are 2+ Psych: Pt's affect is appropriate.  Skin: mild right antecubital erythema from old IV. Neuro: Alert. Aphasia, with neologisms and paraphasias, Followed one step commands intermittently. Very HOH. Moving all 4 extremities. No resting abnl tone.  Musculoskeletal: appears to be able fully range all 4 limbs.     Assessment/Plan: 1. Functional deficits which require 3+ hours per day of interdisciplinary therapy in a comprehensive inpatient rehab setting. Physiatrist is providing close team supervision and 24 hour management of active medical problems listed below. Physiatrist and rehab team continue  to assess barriers to discharge/monitor patient progress toward functional and medical goals  Care Tool:  Bathing    Body parts bathed by patient: Right arm, Left arm, Chest, Abdomen, Front perineal area, Buttocks, Right upper leg, Left upper leg, Right lower leg, Left lower leg, Face         Bathing assist Assist Level: Minimal Assistance - Patient > 75%     Upper Body Dressing/Undressing Upper body dressing   What is the patient wearing?: Pull over shirt    Upper body assist Assist Level: Set up assist    Lower Body Dressing/Undressing Lower body dressing      What is the patient wearing?: Pants, Underwear/pull up     Lower body assist Assist for lower body dressing: Minimal Assistance - Patient > 75%     Toileting Toileting    Toileting assist Assist for toileting: Minimal Assistance - Patient > 75%     Transfers Chair/bed transfer  Transfers assist     Chair/bed transfer assist level: Contact Guard/Touching assist     Locomotion Ambulation   Ambulation assist      Assist level: Minimal Assistance - Patient > 75% Assistive device: No Device Max distance: 150+  Walk 10 feet activity   Assist     Assist level: Contact Guard/Touching assist Assistive device: No Device   Walk 50 feet activity   Assist    Assist level: Minimal Assistance - Patient > 75% Assistive device: No Device    Walk 150 feet activity   Assist    Assist level: Minimal Assistance - Patient > 75% Assistive device: No Device    Walk 10 feet on uneven surface  activity   Assist     Assist level: Minimal Assistance - Patient > 75% Assistive device: Other (comment) (No AD)   Wheelchair     Assist Is the patient using a wheelchair?: No             Wheelchair 50 feet with 2 turns activity    Assist            Wheelchair 150 feet activity     Assist          Blood pressure (!) 110/52, pulse (!) 52, temperature 98.1 F (36.7  C), resp. rate 16, height '5\' 8"'$  (1.727 m), weight 87.6 kg, SpO2 96 %.  Medical Problem List and Plan: 1. 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 Needs hearing aids in for therapy!  -Continue CIR PT, OT, and SLP  2.  Antithrombotics: -DVT/anticoagulation:  Pharmaceutical: Lovenox             -antiplatelet therapy: ASA d/c today and to continue Plavix.  3. Pain Management: Oxycodone prn.  4. Mood/Behavior/Sleep: LCSW to follow for evaluation and support.              -antipsychotic agents: Seroquel prn for agitation.              --Per family--gets up at  3 am then sleeps till late am and off/on in daytime.   -will check sleep chart and begin scheduled trazodone for sleep to help better normalize his sleep-wake cycle. May be hard to change however. -9/29 Consider increase trazodone dose if he continues to have poor sleep 5. Neuropsych/cognition: This patient is not fully capable of making decisions on his own behalf. 6. Skin/Wound Care: Routine pressure relief measures.  7. Fluids/Electrolytes/Nutrition: encourage PO  -I personally reviewed the patient's labs today.    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  9. Delirium: Resolving. Continue Seroquel prn as really sedated him --hx confusion with last stroke. See above 10. Chronic combined CHF:    --Monitor for signs of overload.             --Continue Coreg, Plavix and Crestor.              --add Cardiac restrictions to diet.   -check daily weights Filed Weights   05/01/22 1819 05/03/22 0500  Weight: 88.7 kg 87.6 kg    11. CKD III: Monitor with serial checks. Improved with continuous fluids on board.             --d/c fluids to avoid overload. CXR with mild pulmonary edema pre-op.    -admit labs reviewed and stable 12. HTN: Monitor BP TID--on coreg.   -fair  control.  Monitor for pattern  9/29 decrease coreg to 6.25 BID  13. Pre-diabetes: Hgb A1C-5.8. Add CM restrictions to diet.              --RD consult for dietary education.  -9/29 CBG well controlled overall 14. HH/GERD: Managed --alternates  Protonix with Pepcid every other day.  15. Cellulitis Right forearm: Secondary to infiltrated IV--started 2-3 days ago per wife. --started warm compresses and doxycycline for treatment.  -9/28 signs of improvement today 16. Moderate malnutrition  -Dietician following, Glucerna TID started    LOS: 2 days A FACE TO FACE EVALUATION WAS PERFORMED  Jennye Boroughs 05/03/2022, 3:24 PM

## 2022-05-03 NOTE — Progress Notes (Signed)
Physical Therapy Session Note  Patient Details  Name: Damon Walker MRN: [id number] Date of Birth: 1946/03/21  Today's Date: 05/03/2022 PT Individual Time: 0800-0900 PT Individual Time Calculation (min): 60 min   Short Term Goals: Week 1:  PT Short Term Goal 1 (Week 1): STG=LTG secondary to ELOS  Skilled Therapeutic Interventions/Progress Updates:  Patient greeted supine in bed and agreeable to PT treatment session. Patient transitioned from supine to sitting EOB with supv. Patient requesting to use the restroom at start of treatment session- Patient performed sit/stand without AD and SBA. Patient then ambulated ~15' to the bathroom without AD and CGA/SBA for safety. Patient stood without UE support while he urinated and then stood ~2 minutes at the sink to perform hand hygiene with SBA. Patient then ambulated ~100' to the rehab gym without AD and CGA/SBA.   Patient stood on airex foam while reaching to his L for playing cards and then locating the mach on the back of the mirror in front of him. Patient required Mod/Max verbal and tactile cues for matching jacks, queens and kings, however only required minimal cues for matching numbers/suits. Performed x22 playing cards with CGA/SBA. Minor sways noted, however patient able to maintain stability with appropriate use of his ankle strategy.   Patient tasked with locating numbered dots in sequential order placed on either side of the hallway- Patient require minimal verbal cues for appropriate sequencing of 1-10, however able to appropriately identify each number. Patient was then able to identify the color of each number with 100% accuracy.    Patient then tasked with ambulating from the main rehab gym to the day room (where he completed the card activity)- Min verbal/tactile cues for initial direction, however then he was able to locate the day room and sit on the same mat table as earlier.   Patient ambulated without an AD >300+ throughout  treatment session with SBA/CGA and no significant LOB or steadying required from therapist.   Patient tasked with locating his room and given room number, however required min/cues for correct room location.  Patient returned to his room supine in bed with bed alarm on, call bell within reach and all needs met.    Therapy Documentation Precautions:  Precautions Precautions: Fall Precaution Comments: HOH - hearing aides in room Restrictions Weight Bearing Restrictions: No  Therapy/Group: Individual Therapy  Lavonya Hoerner 05/03/2022, 8:00 AM

## 2022-05-04 DIAGNOSIS — E440 Moderate protein-calorie malnutrition: Secondary | ICD-10-CM | POA: Insufficient documentation

## 2022-05-04 NOTE — Plan of Care (Signed)
  Problem: Consults Goal: RH STROKE PATIENT EDUCATION Description: See Patient Education module for education specifics  Outcome: Progressing Goal: Diabetes Guidelines if Diabetic/Glucose > 140 Description: If diabetic or lab glucose is > 140 mg/dl - Initiate Diabetes/Hyperglycemia Guidelines & Document Interventions  Outcome: Progressing   Problem: RH BOWEL ELIMINATION Goal: RH STG MANAGE BOWEL WITH ASSISTANCE Description: STG Manage Bowel with min Assistance. Outcome: Progressing Goal: RH STG MANAGE BOWEL W/MEDICATION W/ASSISTANCE Description: STG Manage Bowel with Medication with min Assistance. Outcome: Progressing   Problem: RH BLADDER ELIMINATION Goal: RH STG MANAGE BLADDER WITH ASSISTANCE Description: STG Manage Bladder With min Assistance Outcome: Progressing   Problem: RH SKIN INTEGRITY Goal: RH STG SKIN FREE OF INFECTION/BREAKDOWN Description: Skin will be free of infection/breakdown with min assist Outcome: Progressing Goal: RH STG MAINTAIN SKIN INTEGRITY WITH ASSISTANCE Description: STG Maintain Skin Integrity With min Assistance. Outcome: Progressing Goal: RH STG ABLE TO PERFORM INCISION/WOUND CARE W/ASSISTANCE Description: STG Able To Perform Incision/Wound Care With min Assistance. Outcome: Progressing   Problem: RH SAFETY Goal: RH STG ADHERE TO SAFETY PRECAUTIONS W/ASSISTANCE/DEVICE Description: STG Adhere to Safety Precautions With min Assistance/Device. Outcome: Progressing   Problem: RH COGNITION-NURSING Goal: RH STG USES MEMORY AIDS/STRATEGIES W/ASSIST TO PROBLEM SOLVE Description: STG Uses Memory Aids/Strategies With min Assistance to Problem Solve. Outcome: Progressing   Problem: RH PAIN MANAGEMENT Goal: RH STG PAIN MANAGED AT OR BELOW PT'S PAIN GOAL Description: Pain will be managed at 3 out of 10 on pain scale with min assist Outcome: Progressing   Problem: RH Vision Goal: RH LTG Vision (Specify) Outcome: Progressing   Problem:  Consults Goal: RH STROKE PATIENT EDUCATION Description: See Patient Education module for education specifics  Outcome: Progressing

## 2022-05-04 NOTE — IPOC Note (Signed)
Overall Plan of Care [facility]) Patient Details Name: Damon Walker MRN: [id number] DOB: [birth year]  Admitting Diagnosis: Acute ischemic left middle cerebral artery (MCA) stroke [facility])  Hospital Problems: Principal Problem:   Acute ischemic left middle cerebral artery (MCA) stroke (HCC) Active Problems:   Malnutrition of moderate degree     Functional Problem List: Nursing Behavior, Bladder, Bowel, Perception, Safety, Endurance, Sensory, Medication Management, Skin Integrity, Motor  PT Balance, Perception, Endurance, Motor  OT Balance, Behavior, Cognition, Endurance, Motor, Perception, Safety, Vision  SLP Cognition, Linguistic  TR         Basic ADL's: OT Grooming, Bathing, Dressing, Toileting     Advanced  ADL's: OT       Transfers: PT Bed Mobility, Bed to Chair, Teacher, early years/pre, Tub/Shower     Locomotion: PT Ambulation, Stairs     Additional Impairments: OT None  SLP Communication, Social Cognition expression, comprehension Social Interaction, Awareness, Attention  TR      Anticipated Outcomes Item Anticipated Outcome  Self Feeding    Swallowing  NA   Basic self-care  supervision  Toileting  supervision   Bathroom Transfers Supervision  Bowel/Bladder  continent x 2 LBM 09/23  Transfers  ModI with transfers with LRAD  Locomotion  ModI for community distance gait >150' with LRAD  Communication  mod A  Cognition  focus on language goals  Pain  less than 3  Safety/Judgment  remain fall free while in rehab   Therapy Plan: PT Intensity: Minimum of 1-2 x/day ,45 to 90 minutes PT Frequency: 5 out of 7 days PT Duration Estimated Length of Stay: 5-7 days OT Intensity: Minimum of 1-2 x/day, 45 to 90 minutes OT Frequency: 5 out of 7 days OT Duration/Estimated Length of Stay: 5-7 SLP Intensity: Minumum of 1-2 x/day, 30 to 90 minutes SLP Frequency: 3 to 5 out of 7 days SLP Duration/Estimated Length of Stay: 5-7 days   Team Interventions: Nursing  Interventions Patient/Family Education, Disease Management/Prevention, Skin Care/Wound Management, Discharge Planning, Bladder Management, Cognitive Remediation/Compensation, Psychosocial Support, Bowel Management, Medication Management  PT interventions Ambulation/gait training, Cognitive remediation/compensation, Discharge planning, DME/adaptive equipment instruction, Functional mobility training, Pain management, Psychosocial support, Splinting/orthotics, Therapeutic Activities, UE/LE Strength taining/ROM, Visual/perceptual remediation/compensation, Training and development officer, Community reintegration, Disease management/prevention, Neuromuscular re-education, Barrister's clerk education, IT trainer, Therapeutic Exercise, UE/LE Coordination activities  OT Interventions Training and development officer, Cognitive remediation/compensation, Academic librarian, Discharge planning, Disease mangement/prevention, Engineer, drilling, Functional electrical stimulation, Functional mobility training, Neuromuscular re-education, Pain management, Patient/family education, Psychosocial support, Self Care/advanced ADL retraining, Skin care/wound managment, Splinting/orthotics, Therapeutic Activities, Therapeutic Exercise, UE/LE Strength taining/ROM, UE/LE Coordination activities, Visual/perceptual remediation/compensation, Wheelchair propulsion/positioning  SLP Interventions Cueing hierarchy, Patient/family education, Therapeutic Activities, Internal/external aids, Multimodal communication approach, Speech/Language facilitation  TR Interventions    SW/CM Interventions Discharge Planning, Psychosocial Support, Patient/Family Education   Barriers to Discharge MD  Medical stability, Incontinence, Lack of/limited family support, Medication compliance, Behavior, and Nutritional means  Nursing Decreased caregiver support, Wound Care 1 level appartment with level entry  PT      OT      SLP      SW  Insurance for SNF coverage, Lack of/limited family support     Team Discharge Planning: Destination: PT-Home ,OT- Home , SLP-Home Projected Follow-up: PT-Outpatient PT, OT-  Outpatient OT, 24 hour supervision/assistance, SLP-24 hour supervision/assistance, Home Health SLP, Outpatient SLP Projected Equipment Needs: PT-To be determined, OT- To be determined, SLP-None recommended by SLP Equipment Details: PT- , OT-May need a tub transfer bench Patient/family involved  in discharge planning: PT- Patient, Family member/caregiver,  OT-Family member/caregiver, Patient, SLP-Patient, Family member/caregiver  MD ELOS: 7-10 days Medical Rehab Prognosis:  Good Assessment: The patient has been admitted for CIR therapies with the diagnosis of L parietal stroke. The team will be addressing functional mobility, strength, stamina, balance, safety, adaptive techniques and equipment, self-care, bowel and bladder mgt, patient and caregiver education. Goals have been set at [facility] for OT, Mod I for PT, and [facility] SLP. Anticipated discharge destination is home.       See Team Conference Notes for weekly updates to the plan of care

## 2022-05-05 NOTE — Progress Notes (Signed)
Speech Language Pathology Daily Session Note  Patient Details  Name: Trip Cavanagh MRN: [id number] Date of Birth: 06-Apr-1946  Today's Date: 05/05/2022 SLP Individual Time: 1300-1345 SLP Individual Time Calculation (min): 45 min  Short Term Goals: Week 1: SLP Short Term Goal 1 (Week 1): STG-LTG due to ELOS  Skilled Therapeutic Interventions:  Pt was seen for skilled ST targeting communication goals.  Upon arrival, pt was reclined in bed with wife at bedside.  Pt agreeable to participating in treatment.  Pt was able to match object to word from a field of three for 100% accuracy with supervision cues and extra time.  SLP increased task complexity with phrases describing objects rather than single words and pt was still able to match object to phrase for 100% accuracy with supervision and extra time; however, he required up to max assist to then name targeted items.  Pt needed mod-max assist verbal and written cues to describe simple actions in photographs and consistent max assist multimodal cues to verbally name 3 items in a targeted category (ie "What games do you play with a ball?" or "What do you need to write?").  Suprisingly, it was easier for pt to write specific category members and he could often provide 2 out of 3 examples when writing, although pt began to demonstrate perseveration as writing tasks progressed, requiring max cues to redirect.  Pt was left in bed with bed alarm set and wife at bedside.  Continue per current plan of care.    Pain Pain Assessment Pain Scale: 0-10 Pain Score: 0-No pain  Therapy/Group: Individual Therapy  Lacreshia Bondarenko, Selinda Orion 05/05/2022, 3:34 PM

## 2022-05-05 NOTE — Progress Notes (Addendum)
PROGRESS NOTE   Subjective/Complaints: No new concerns. No events overnight. Sleeping well per patient. Wife states he is not eating well, but this is consistent with prior hospitalizations and that he has responded poorly to appetite stimulant medications in the past. "He eats fine when he gets home". She is bringing in snacks from outside to encourage POs.   LBM 9/29, small   ROS: Limited due to cognitive/behavioral    Objective:   No results found. No results for input(s): "WBC", "HGB", "HCT", "PLT" in the last 72 hours.  No results for input(s): "NA", "K", "CL", "CO2", "GLUCOSE", "BUN", "CREATININE", "CALCIUM" in the last 72 hours.   Intake/Output Summary (Last 24 hours) at 05/05/2022 2253 Last data filed at 05/05/2022 1858 Gross per 24 hour  Intake 240 ml  Output --  Net 240 ml         Physical Exam: Vital Signs Blood pressure (!) 117/49, pulse (!) 58, temperature 99.1 F (37.3 C), resp. rate 18, height '5\' 8"'$  (1.727 m), weight 84.8 kg, SpO2 96 %.  General: Alert, No apparent distress HEENT: MMM, conjugate gaze Neck: Supple without JVD or lymphadenopathy Heart: Reg rate and rhythm. No murmurs rubs or gallops Chest: CTA bilaterally without wheezes, rales, or rhonchi; no distress Abdomen: Soft, non-tender, non-distended, bowel sounds positive. Extremities: No clubbing, cyanosis, or edema. Pulses are 2+ Psych: Pt's affect is appropriate.  Skin: C/D/I.  Neuro: Alert and oriented to self; place and time with cues. Aphasia, with neologisms and paraphasias, Followed one step commands intermittently. Very HOH.  Musculoskeletal: Moving all 4 extremities in bed, antigravity.    Assessment/Plan: 1. Functional deficits which require 3+ hours per day of interdisciplinary therapy in a comprehensive inpatient rehab setting. Physiatrist is providing close team supervision and 24 hour management of active medical problems  listed below. Physiatrist and rehab team continue to assess barriers to discharge/monitor patient progress toward functional and medical goals  Care Tool:  Bathing    Body parts bathed by patient: Right arm, Left arm, Chest, Abdomen, Front perineal area, Buttocks, Right upper leg, Left upper leg, Right lower leg, Left lower leg, Face         Bathing assist Assist Level: Minimal Assistance - Patient > 75%     Upper Body Dressing/Undressing Upper body dressing   What is the patient wearing?: Pull over shirt    Upper body assist Assist Level: Set up assist    Lower Body Dressing/Undressing Lower body dressing      What is the patient wearing?: Pants, Underwear/pull up     Lower body assist Assist for lower body dressing: Minimal Assistance - Patient > 75%     Toileting Toileting    Toileting assist Assist for toileting: Minimal Assistance - Patient > 75%     Transfers Chair/bed transfer  Transfers assist     Chair/bed transfer assist level: Contact Guard/Touching assist     Locomotion Ambulation   Ambulation assist      Assist level: Contact Guard/Touching assist Assistive device: No Device Max distance: 150+   Walk 10 feet activity   Assist     Assist level: Contact Guard/Touching assist Assistive device: No Device  Walk 50 feet activity   Assist    Assist level: Contact Guard/Touching assist Assistive device: No Device    Walk 150 feet activity   Assist    Assist level: Contact Guard/Touching assist Assistive device: No Device    Walk 10 feet on uneven surface  activity   Assist     Assist level: Minimal Assistance - Patient > 75% Assistive device: Other (comment) (No AD)   Wheelchair     Assist Is the patient using a wheelchair?: No             Wheelchair 50 feet with 2 turns activity    Assist            Wheelchair 150 feet activity     Assist          Blood pressure (!) 117/49, pulse  (!) 58, temperature 99.1 F (37.3 C), resp. rate 18, height '5\' 8"'$  (1.727 m), weight 84.8 kg, SpO2 96 %.  Medical Problem List and Plan: 1. 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 Needs hearing aids in for therapy!  -Continue CIR PT, OT, and SLP  2.  Antithrombotics: -DVT/anticoagulation:  Pharmaceutical: Lovenox             -antiplatelet therapy: ASA d/c today and to continue Plavix.  3. Pain Management: Oxycodone prn.  4. Mood/Behavior/Sleep: LCSW to follow for evaluation and support.              -antipsychotic agents: Seroquel prn for agitation.              --Per family--gets up at  3 am then sleeps till late am and off/on in daytime.   -will check sleep chart and begin scheduled trazodone for sleep to help better normalize his sleep-wake cycle. May be hard to change however. -9/29 Consider increase trazodone dose if he continues to have poor sleep   5. Neuropsych/cognition: This patient is not fully capable of making decisions on his own behalf. 6. Skin/Wound Care: Routine pressure relief measures.  7. Fluids/Electrolytes/Nutrition: encourage PO  -I personally reviewed the patient's labs today.    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  9. Delirium: Resolving. Continue Seroquel prn as really sedated him --hx confusion with last stroke. See above 10. Chronic combined CHF:    --Monitor for signs of overload.             --Continue Coreg, Plavix and Crestor.              --add Cardiac restrictions to diet.   -check daily weights - downtrending Filed Weights   05/03/22 0500 05/04/22 0336 05/05/22 0500  Weight: 87.6 kg 85.3 kg 84.8 kg    11. CKD III: Monitor with serial checks. Improved with continuous fluids on board.             --d/c fluids to avoid overload. CXR with mild pulmonary edema pre-op.     -admit labs reviewed and stable 12. HTN: Monitor BP TID--on coreg.   -fair control.  Monitor for pattern  9/29 decrease coreg to 6.25 BID            10/1 - HR borderline low/stable, BP well controlled  05/05/2022    7:31 PM 05/05/2022    2:01 PM 05/05/2022    5:00 AM  Vitals with BMI  Weight   186 lbs 15 oz  BMI   82.50  Systolic 539 767   Diastolic 49 52   Pulse 58 57      13. Pre-diabetes: Hgb A1C-5.8. Add CM restrictions to diet.              --RD consult for dietary education.  -9/29 CBG well controlled overall - 10/1 - none recorded since 9/27; ?DCed  14. HH/GERD: Managed --alternates  Protonix with Pepcid every other day.  15. Cellulitis Right forearm: Secondary to infiltrated IV--started 2-3 days ago per wife. --started warm compresses and doxycycline for treatment.  -9/28 signs of improvement today 16. Moderate malnutrition  -Dietician following, Glucerna TID started             - wife encouraging POs with outside food.             - Eating 0-25% meals over last 2 days. Appears to have been 75-100% prior to that.     LOS: 4 days A FACE TO [place] 05/05/2022, 10:53 PM

## 2022-05-05 NOTE — Plan of Care (Signed)
  Problem: Consults Goal: RH STROKE PATIENT EDUCATION Description: See Patient Education module for education specifics  Outcome: Progressing Goal: Diabetes Guidelines if Diabetic/Glucose > 140 Description: If diabetic or lab glucose is > 140 mg/dl - Initiate Diabetes/Hyperglycemia Guidelines & Document Interventions  Outcome: Progressing   Problem: RH BOWEL ELIMINATION Goal: RH STG MANAGE BOWEL WITH ASSISTANCE Description: STG Manage Bowel with min Assistance. Outcome: Progressing Goal: RH STG MANAGE BOWEL W/MEDICATION W/ASSISTANCE Description: STG Manage Bowel with Medication with min Assistance. Outcome: Progressing   Problem: RH BLADDER ELIMINATION Goal: RH STG MANAGE BLADDER WITH ASSISTANCE Description: STG Manage Bladder With min Assistance Outcome: Progressing   Problem: RH SKIN INTEGRITY Goal: RH STG SKIN FREE OF INFECTION/BREAKDOWN Description: Skin will be free of infection/breakdown with min assist Outcome: Progressing Goal: RH STG MAINTAIN SKIN INTEGRITY WITH ASSISTANCE Description: STG Maintain Skin Integrity With min Assistance. Outcome: Progressing Goal: RH STG ABLE TO PERFORM INCISION/WOUND CARE W/ASSISTANCE Description: STG Able To Perform Incision/Wound Care With min Assistance. Outcome: Progressing   Problem: RH SAFETY Goal: RH STG ADHERE TO SAFETY PRECAUTIONS W/ASSISTANCE/DEVICE Description: STG Adhere to Safety Precautions With min Assistance/Device. Outcome: Progressing   Problem: RH COGNITION-NURSING Goal: RH STG USES MEMORY AIDS/STRATEGIES W/ASSIST TO PROBLEM SOLVE Description: STG Uses Memory Aids/Strategies With min Assistance to Problem Solve. Outcome: Progressing   Problem: RH PAIN MANAGEMENT Goal: RH STG PAIN MANAGED AT OR BELOW PT'S PAIN GOAL Description: Pain will be managed at 3 out of 10 on pain scale with min assist Outcome: Progressing   Problem: RH KNOWLEDGE DEFICIT Goal: RH STG INCREASE KNOWLEDGE OF DIABETES Description:  Patient/caregiver will be able to verbalize appropriate diet and how to correctly count carbs via nursing education and handouts Outcome: Progressing Goal: RH STG INCREASE KNOWLEDGE OF HYPERTENSION Description: Patient/caregiver will be able to verbalize medications related to HTN and lifestyle changes from nursing education and handouts Outcome: Progressing Goal: RH STG INCREASE KNOWLEGDE OF HYPERLIPIDEMIA Description: Patient/caregiver will be able to verbalize medications related to hyperlipidemia with a goal of less than 70.  Nursing education around medications, diet, and lifestyle changes.  Outcome: Progressing Goal: RH STG INCREASE KNOWLEDGE OF STROKE PROPHYLAXIS Description: Patient/caregiver will be able to verbalize medications and related to stroke prophylaxis via hand outs and nursing education.  Outcome: Progressing   Problem: RH Vision Goal: RH LTG Vision (Specify) Outcome: Progressing   Problem: Consults Goal: RH STROKE PATIENT EDUCATION Description: See Patient Education module for education specifics  Outcome: Progressing

## 2022-05-05 NOTE — Progress Notes (Signed)
Physical Therapy Session Note  Patient Details  Name: Damon Walker MRN: [id number] Date of Birth: 03-28-1946  Today's Date: 05/05/2022 PT Individual Time: 1115-1157 PT Individual Time Calculation (min): 42 min   Short Term Goals: Week 1:  PT Short Term Goal 1 (Week 1): STG=LTG secondary to ELOS  Skilled Therapeutic Interventions/Progress Updates:   Session focused on functional mobility retraining on unit, NMR for balance retraining, R attention and scanning, and overall activity tolerance/endurance.   Pt performed functional bed mobility at supervision for supine <> sit at beginning and end of session. Donned shoes EOB with supervision for functional dynamic sitting balance. Close supervision to CGA for gait on unit > 150' to various gym spaces - cues for upright posture and attention to the R with obstacles and pathfinding.   Pt performed 3 trials of TUG for fall risk assessment (see results below) without AD.  Stair negotiation training for community mobility training up/down 12 steps (6" and 4" height) with supervision to CGA varying 1 or 2 handrails.   NMR for balance retraining on K[place] in standing with focus on maintaining balance on uneven surface while performing functionla reaching task to grab horseshoes on R and L side and the pitch with 1 UE support for balance. Performed 1 time each side x 8 horseshoes. Pt the able to perform dynamic reaching task to pick up items from floor and then carrry basket back to shelf at end of task.  End of session returned back to bed with supervision and bed alarm on.   Therapy Documentation Precautions:  Precautions Precautions: Fall Precaution Comments: HOH - hearing aides in room Restrictions Weight Bearing Restrictions: No  Pain:  Denies pain.    Balance: Standardized Balance Assessment Standardized Balance Assessment: Timed Up and Go Test Timed Up and Go Test TUG: Normal TUG Normal TUG (seconds): 22.6 (3 trials avg no  device)     Therapy/Group: Individual Therapy  Canary Brim Ivory Broad, PT, DPT, CBIS  05/05/2022, 12:11 PM

## 2022-05-05 NOTE — Progress Notes (Signed)
Occupational Therapy Session Note  Patient Details  Name: Damon Walker MRN: [id number] Date of Birth: 1946/05/04  Today's Date: 05/05/2022 OT Individual Time: [phone] OT Individual Time Calculation (min): 55 min    Short Term Goals: Week 1:  OT Short Term Goal 1 (Week 1): LTG=STG 2/2 ELOS  Skilled Therapeutic Interventions/Progress Updates:    Pt received supine with no c/o pain, agreeable to OT session. Pt completed bed mobility to EOB with mod I. Ambulatory transfer into the bathroom with CGA without an AD. He required min cueing for sequencing doffing of clothes and transferring to TTB in walk in shower. He completed bathing with close (S), min guard to stand for peri hygiene. Good sequencing of bathing overall. He transferred to a chair at the sink with CGA following shower. Dressing with CGA overall. He did place hearing aid in the wrong ear and declined wearing the R one. He completed 200 ft of functional mobility without a device with (S) to the therapy gym. He completed the bell cancellation test on the BITS. He demonstrated an ineffective scanning pattern despite cueing and missed 10 bells. It also required extra time to locate bells on the R quadrants. Education provided following activity on implications to home ADL/IADL performance. Pt completed 1 more visual scanning activity with focus on R scanning. He is able to self adjust to need for R head turn to compensate for R hemianopsia. Pt returned to his room, 200 ft of functional mobility with (S). Pt left supine with all needs met, bed alarm set.    Therapy Documentation Precautions:  Precautions Precautions: Fall Precaution Comments: HOH - hearing aides in room Restrictions Weight Bearing Restrictions: No   Therapy/Group: Individual Therapy  Curtis Sites 05/05/2022, 7:17 AM

## 2022-05-05 NOTE — Progress Notes (Signed)
Physical Therapy Session Note  Patient Details  Name: Damon Walker MRN: [id number] Date of Birth: 01-23-1946  Today's Date: 05/05/2022 PT Individual Time: 1446-1530 PT Individual Time Calculation (min): 44 min   Short Term Goals: Week 1:  PT Short Term Goal 1 (Week 1): STG=LTG secondary to ELOS  Skilled Therapeutic Interventions/Progress Updates:      Therapy Documentation Precautions:  Precautions Precautions: Fall Precaution Comments: HOH - hearing aides in room Restrictions Weight Bearing Restrictions: No  Pt received semi-reclined in bed, agreeable to PT session with emphasis on dual-tasking with gait and neuromuscular re-education. Pt declines pain in session. Pt (S) with bed mobility, sit to stand and ambulation to dayroom ~90 feet with no AD. Pt requires (S) for dynamic standing balance while stepping to dot target based on color and number. Pt requires (S) for picking up objects off the floor based on color called out by PT. Pt (S) for static and dynamic sitting balance on bosu ball while engaging in corn hole. Pt ambulated to room and left semi-reclined in bed with alarm on and all needs within reach.    Therapy/Group: Individual Therapy  Verl Dicker Verl Dicker PT, DPT  05/05/2022, 7:58 AM

## 2022-05-06 LAB — BASIC METABOLIC PANEL
Anion gap: 6 (ref 5–15)
BUN: 15 mg/dL (ref 8–23)
CO2: 25 mmol/L (ref 22–32)
Calcium: 8.7 mg/dL — ABNORMAL LOW (ref 8.9–10.3)
Chloride: 109 mmol/L (ref 98–111)
Creatinine, Ser: 1.21 mg/dL (ref 0.61–1.24)
GFR, Estimated: >60 mL/min (ref >60)
Glucose, Bld: 106 mg/dL — ABNORMAL HIGH (ref 70–99)
Potassium: 4 mmol/L (ref 3.5–5.1)
Sodium: 140 mmol/L (ref 135–145)

## 2022-05-06 LAB — CBC
HCT: 37 % — ABNORMAL LOW (ref 39.0–52.0)
Hemoglobin: 12.4 g/dL — ABNORMAL LOW (ref 13.0–17.0)
MCH: 30.7 pg (ref 26.0–34.0)
MCHC: 33.5 g/dL (ref 30.0–36.0)
MCV: 91.6 fL (ref 80.0–100.0)
Platelets: 272 10*3/uL (ref 150–400)
RBC: 4.04 MIL/uL — ABNORMAL LOW (ref 4.22–5.81)
RDW: 12.6 % (ref 11.5–15.5)
WBC: 8.6 10*3/uL (ref 4.0–10.5)
nRBC: 0 % (ref 0.0–0.2)

## 2022-05-06 NOTE — Progress Notes (Signed)
Occupational Therapy Session Note  Patient Details  Name: Damon Walker MRN: [id number] Date of Birth: 10-20-1945  Today's Date: 05/06/2022 OT Individual Time: 1600-1630 OT Individual Time Calculation (min): 30 min    Short Term Goals: Week 1:  OT Short Term Goal 1 (Week 1): LTG=STG 2/2 ELOS  Skilled Therapeutic Interventions/Progress Updates:    Upon OT arrival, pt semi recumbent with spouse present at bedside. Pt agreeable to OT treatment and reports no pain. Treatment intervention with a focus on visual perception, scanning, attention to the R side. Pt completes supine to sit transfer independently, sit to stand transfer with supervision, and ambulates to the main gym without device with supervision. Pt completes stand to sit transfer with supervision and sits at tabletop to replicate complex graphic design of small plastic pegs onto peg board using B UE. Pt with max difficulty to replicate design requiring verbal and tactile cues noting pt mostly missing pieces from middle and right side of design. Task was ended. Pt instructed to stack large plastic pegs on top of each other with visual demonstration provided. Pt with no difficulty to complete and was able to match colors together. Pt did tend to place pegs on L side of board versus the R side without recognizing. Pt completes sit to stand transfer with supervision and ambulates back to his room with supervision. Pt returns to EOB with Supervision and returns to supine independently. Pt was left in bed at end of session with all needs met and safety measures in place.   Therapy Documentation Precautions:  Precautions Precautions: Fall Precaution Comments: HOH - hearing aides in room Restrictions Weight Bearing Restrictions: No   Therapy/Group: Individual Therapy  Marvetta Gibbons 05/06/2022, 5:23 PM

## 2022-05-06 NOTE — Patient Care Conference (Signed)
Inpatient RehabilitationTeam Conference and Plan of Care Update Date: 05/06/2022   Time:  15:44 PM   Patient Name: Damon Walker      Medical Record Number: [id number]  Date of Birth: April 06, 1946 Sex: Male         Room/Bed: 4W18C/4W18C-01 Payor Info: Payor: [place] / Plan: [employer] MEDICARE / Product Type: *No Product type* /    Admit Date/Time:  05/01/2022  5:22 PM  Primary Diagnosis:  Acute ischemic left middle cerebral artery (MCA) stroke [facility])  Hospital Problems: Principal Problem:   Acute ischemic left middle cerebral artery (MCA) stroke ([place]) Active Problems:   Malnutrition of moderate degree    Expected Discharge Date: Expected Discharge Date: 05/08/22  Team Members Present: Physician leading conference: Dr. Jennye Boroughs Social Worker Present: Ovidio Kin, LCSW Nurse Present: Dorien Chihuahua, RN PT Present: Other (comment) Jodi Mourning Rafoth, PT) OT Present: Laverle Hobby, OT;Other (comment) Lonn Georgia Hirschfelder, OT) SLP Present: Sherren Kerns, SLP     Current Status/Progress Goal Weekly Team Focus  Bowel/Bladder   continent         Swallow/Nutrition/ Hydration             ADL's             Mobility   Supv for all mobility without the use of an AD  ModI/Supv  Cognitive dual-tasking, dynamic stability, B LE strengthening, endurance/activity tolerance   Communication   Max A for comprehension, Mod A for multimodal communication  Mod-Max A  Family Education   Safety/Cognition/ Behavioral Observations  Mod A  Mod A  family education   Pain   no pain         Skin   no skin issues           Discharge Planning:    Home with wife; 1 level/level entry  Team Discussion: Patient doing well overall Patient on target to meet rehab goals: yes  *See Care Plan and progress notes for long and short-term goals.   Revisions to Treatment Plan:  N/A  Teaching Needs: Safety, medications, secondary risk management, transfers, etc  Current Barriers  to Discharge: Decreased caregiver support  Possible Resolutions to Barriers: Family education 05/07/22 DME: TTB OP follow up services      Medical Summary Current Status: Left parietal infarct, HTN,CKD III, HTN, CHF, R forearm cellulitis  Barriers to Discharge: Home enviroment access/layout;Medical stability  Barriers to Discharge Comments: Left parietal infarct, HTN,CKD III, HTN, CHF Possible Resolutions to [employer] Focus: monitor BP, MBET, PO intake,antibiotics   Continued Need for Acute Rehabilitation Level of Care: The patient requires daily medical management by a physician with specialized training in physical medicine and rehabilitation for the following reasons: Direction of a multidisciplinary physical rehabilitation program to maximize functional independence : Yes Medical management of patient stability for increased activity during participation in an intensive rehabilitation regime.: Yes Analysis of laboratory values and/or radiology reports with any subsequent need for medication adjustment and/or medical intervention. : Yes   I attest that I was present, lead the team conference, and concur with the assessment and plan of the team.   Dorien Chihuahua B 05/07/2022, 3:52 PM

## 2022-05-06 NOTE — Progress Notes (Signed)
Physical Therapy Session Note  Patient Details  Name: Damon Walker MRN: [id number] Date of Birth: 09/28/1945  Today's Date: 05/06/2022 PT Individual Time: 0831-0915 PT Individual Time Calculation (min): 44 min   Short Term Goals: Week 1:  PT Short Term Goal 1 (Week 1): STG=LTG secondary to ELOS  Skilled Therapeutic Interventions/Progress Updates: Pt presents sitting EOB and agreeable to therapy, handed off from NT.  Pt donned shoes w/ set-up, handed to pt.  Pt leans forward and attaches heel strap.  Pt transfers sit to stand w/ supervision and verbal cues for safety.  Pt amb > 200' w/ supervision and verbal cues for directions.  Pt amb w/ cues for attention to left, w/ 1 episode of bumping obstacle on left.  Pt performed standing reaching and hooking horseshoes over hoop reaching outside of BOS to sides from table, then chair seat height and then 6" platforms w/o LOB.  Pt performed standing on [employer] cushion.  Pt amb 200+ feet multiple trials w/ verbal cues for visual scanning w/ noted HOH.  Pt negotiated ramp and mulch w/ supervision and then 1 rail for step down 6" step.  Pt returned to room and wished to return to bed w/ all needs in reach and bed alarm on.     Therapy Documentation Precautions:  Precautions Precautions: Fall Precaution Comments: HOH - hearing aides in room Restrictions Weight Bearing Restrictions: No General:   Vital Signs:  Pain:0/10      Therapy/Group: Individual Therapy  [name] 05/06/2022, 9:16 AM

## 2022-05-06 NOTE — Progress Notes (Signed)
Speech Language Pathology Daily Session Note  Patient Details  Name: Diante Barley MRN: [id number] Date of Birth: 1946/01/19  Today's Date: 05/06/2022 SLP Individual Time: 4801-6553 SLP Individual Time Calculation (min): 60 min  Short Term Goals: Week 1: SLP Short Term Goal 1 (Week 1): STG-LTG due to ELOS  Skilled Therapeutic Interventions:Skilled ST services focused on education and language skills. Pt demonstrated greater expressive tham receptive skills during today's treatment session. Pt demonstrated ability to name 10/10 objects, name object function with min A semantic cues, name functions of objects in photographs 9/10 and name function of objects in black/white drawing in 8/10 opportunities. Pt demonstrated difficulty expanding and sequencing expression past 1-2 words, requiring max A verbal cues for syntax and semantic cues. Pt demonstrated mod verbal error awareness with ability to self-correct near 50% of the time. Pt demonstrated 50% accuracy answering yes/no biographical/immediate environment questions and was unable to comprehend direction to read given phrases aloud despite max written/modeling cues. Pt demonstrated good reading comprehension at word level matching to objects in a field of 4 and at phrases level from a field of 3. However pt only read the word level aloud and never read aloud the phrases on command. Pt was able to write full name mod I and address with min A verbal cues. Pt was able to write the names of objects but required max A semantic and spelling cues to write object function. SLP provided education per pt's wife request on current language goals and strategies to increase comprehension and expand verbal expression. Pt was left in room with wife, call bell within reach and bed alarm set. SLP recommends to continue skilled services.      Pain Pain Assessment Pain Score: 0-No pain  Therapy/Group: Individual Therapy  Markevious Ehmke  [facility] 05/06/2022, 2:15 PM

## 2022-05-06 NOTE — Progress Notes (Signed)
Patient ID: Damon Walker, male   DOB: 01-Dec-1945, 77 y.o.   MRN: [id number] Informed by PT pt would be ready for discharge mid week. Have scheduled wife to come on tomorrow so can do hands on therapies in anticipation of discharge Wed if team and MD on board. Will discuss OP versus HH tomorrow with wife.

## 2022-05-06 NOTE — Progress Notes (Signed)
PROGRESS NOTE   Subjective/Complaints: Pt seen in gym this AM. He was previously ambulating with therapy. Eating more of his food today (65-70%). No new concerns elicited.   LBM 10/2   ROS: Limited due to cognitive/behavioral    Objective:   No results found. Recent Labs    05/06/22 0659  WBC 8.6  HGB 12.4*  HCT 37.0*  PLT 272    No results for input(s): "NA", "K", "CL", "CO2", "GLUCOSE", "BUN", "CREATININE", "CALCIUM" in the last 72 hours.   Intake/Output Summary (Last 24 hours) at 05/06/2022 0800 Last data filed at 05/05/2022 1858 Gross per 24 hour  Intake 240 ml  Output --  Net 240 ml         Physical Exam: Vital Signs Blood pressure (!) 132/55, pulse (!) 55, temperature 98 F (36.7 C), resp. rate 17, height '5\' 8"'$  (1.727 m), weight 84.1 kg, SpO2 95 %.  General: Alert, No apparent distress. Working with therapy HEENT: MMM, conjugate gaze Neck: Supple without JVD or lymphadenopathy Heart: Reg rate and rhythm. No murmurs rubs or gallops Chest: CTA bilaterally without wheezes, rales, or rhonchi; good air movement, no distress Abdomen: Soft, non-tender, non-distended, bowel sounds positive. Extremities: No clubbing, cyanosis, or edema. Pulses are 2+ Psych: Pt's affect is appropriate.  Skin: C/D/I.  Neuro: Alert and oriented to self; place and time with cues. Aphasia, with neologisms and paraphasias, Followed one step commands intermittently. Very HOH.  Musculoskeletal: Moving all 4 extremities in bed, antigravity.    Assessment/Plan: 1. Functional deficits which require 3+ hours per day of interdisciplinary therapy in a comprehensive inpatient rehab setting. Physiatrist is providing close team supervision and 24 hour management of active medical problems listed below. Physiatrist and rehab team continue to assess barriers to discharge/monitor patient progress toward functional and medical goals  Care  Tool:  Bathing    Body parts bathed by patient: Right arm, Left arm, Chest, Abdomen, Front perineal area, Buttocks, Right upper leg, Left upper leg, Right lower leg, Left lower leg, Face         Bathing assist Assist Level: Minimal Assistance - Patient > 75%     Upper Body Dressing/Undressing Upper body dressing   What is the patient wearing?: Pull over shirt    Upper body assist Assist Level: Set up assist    Lower Body Dressing/Undressing Lower body dressing      What is the patient wearing?: Pants, Underwear/pull up     Lower body assist Assist for lower body dressing: Minimal Assistance - Patient > 75%     Toileting Toileting    Toileting assist Assist for toileting: Minimal Assistance - Patient > 75%     Transfers Chair/bed transfer  Transfers assist     Chair/bed transfer assist level: Contact Guard/Touching assist     Locomotion Ambulation   Ambulation assist      Assist level: Contact Guard/Touching assist Assistive device: No Device Max distance: 150+   Walk 10 feet activity   Assist     Assist level: Contact Guard/Touching assist Assistive device: No Device   Walk 50 feet activity   Assist    Assist level: Contact Guard/Touching assist Assistive device:  No Device    Walk 150 feet activity   Assist    Assist level: Contact Guard/Touching assist Assistive device: No Device    Walk 10 feet on uneven surface  activity   Assist     Assist level: Minimal Assistance - Patient > 75% Assistive device: Other (comment) (No AD)   Wheelchair     Assist Is the patient using a wheelchair?: No             Wheelchair 50 feet with 2 turns activity    Assist            Wheelchair 150 feet activity     Assist          Blood pressure (!) 132/55, pulse (!) 55, temperature 98 F (36.7 C), resp. rate 17, height '5\' 8"'$  (1.727 m), weight 84.1 kg, SpO2 95 %.  Medical Problem List and Plan: 1. 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 Needs hearing aids in for therapy!  -Continue CIR PT, OT, and SLP  2.  Antithrombotics: -DVT/anticoagulation:  Pharmaceutical: Lovenox             -antiplatelet therapy: ASA d/c today and to continue Plavix.  3. Pain Management: Oxycodone prn.  4. Mood/Behavior/Sleep: LCSW to follow for evaluation and support.              -antipsychotic agents: Seroquel prn for agitation.              --Per family--gets up at  3 am then sleeps till late am and off/on in daytime.   -will check sleep chart and begin scheduled trazodone for sleep to help better normalize his sleep-wake cycle. May be hard to change however. -9/29 Consider increase trazodone dose if he continues to have poor sleep 5. Neuropsych/cognition: This patient is not fully capable of making decisions on his own behalf. 6. Skin/Wound Care: Routine pressure relief measures.  7. Fluids/Electrolytes/Nutrition: encourage PO  -I personally reviewed the patient's labs today.    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  9. Delirium: Resolving. Continue Seroquel prn as really sedated him --hx confusion with last stroke. See above 10. Chronic combined CHF:    --Monitor for signs of overload.             --Continue Coreg, Plavix and Crestor.              --add Cardiac restrictions to diet.   -check daily weights - downtrending Filed Weights   05/04/22 0336 05/05/22 0500 05/06/22 0500  Weight: 85.3 kg 84.8 kg 84.1 kg    11. CKD III: Monitor with serial checks. Improved with continuous fluids on board.             --d/c fluids to avoid overload. CXR with mild pulmonary edema pre-op.    -10/2 Cr stable at 1.21 12. HTN: Monitor BP TID--on coreg.   -fair control.  Monitor for pattern  9/29 decrease coreg to 6.25 BID            10/1 - HR  borderline low/stable, BP well controlled  10/2 HR and BP well controlled    05/06/2022    1:19 PM 05/06/2022  9:00 AM 05/06/2022    5:00 AM  Vitals with BMI  Weight   185 lbs 7 oz  BMI   64.1  Systolic 583 094   Diastolic 64 64   Pulse 73 62      13. Pre-diabetes: Hgb A1C-5.8. Add CM restrictions to diet.              --RD consult for dietary education.  -9/29 CBG well controlled overall - 10/1 - none recorded since 9/27; ?DCed  14. HH/GERD: Managed --alternates  Protonix with Pepcid every other day.  15. Cellulitis Right forearm: Secondary to infiltrated IV--started 2-3 days ago per wife. --started warm compresses and doxycycline for treatment.  -9/28 signs of improvement today 16. Moderate malnutrition  -Dietician following, Glucerna TID started             - wife encouraging POs with outside food.             - Eating 0-25% meals over last 2 days. Appears to have been 75-100% prior to that.   -Eating 65-70 % today, improved    LOS: 5 days A FACE TO FACE EVALUATION WAS PERFORMED  Jennye Boroughs 05/06/2022, 8:00 AM

## 2022-05-06 NOTE — Progress Notes (Signed)
Occupational Therapy Session Note  Patient Details  Name: Mendell Bontempo MRN: [id number] Date of Birth: [birth year]  Today's Date: 05/06/2022 OT Individual Time: 0915-1010 OT Individual Time Calculation (min): 55 min    Short Term Goals: Week 1:  OT Short Term Goal 1 (Week 1): LTG=STG 2/2 ELOS  Skilled Therapeutic Interventions/Progress Updates:    Pt resting in bed upon arrival and agreeable to participating in therapy. Pt declined bathing/dressing this monring. OT intervention with focus on functional amb without AD, standing balance, following directions, and safety awareness. All amb without AD with CGA. Pt easily distracted in noisy envirionment and required mod verbal cues to attend to envirionment to avoid walking into wall/objects. Pt engaged in standing tasks on compliant/noncompliant surfaces with CGA. Global inattention to surroundings. Pt returned to room and remained in bed with all needs within reach. Bed alarm activated.   Therapy Documentation Precautions:  Precautions Precautions: Fall Precaution Comments: HOH - hearing aides in room Restrictions Weight Bearing Restrictions: No Pain:  Pt denies pain this morning   Therapy/Group: Individual Therapy  Leroy Libman 05/06/2022, 10:14 AM

## 2022-05-07 ENCOUNTER — Other Ambulatory Visit (HOSPITAL_COMMUNITY): Payer: Self-pay

## 2022-05-07 DIAGNOSIS — R410 Disorientation, unspecified: Secondary | ICD-10-CM

## 2022-05-07 DIAGNOSIS — L03113 Cellulitis of right upper limb: Secondary | ICD-10-CM

## 2022-05-07 MED ORDER — SERTRALINE HCL 25 MG PO TABS
12.5000 mg | ORAL_TABLET | Freq: Every day | ORAL | 0 refills | Status: AC
  Filled 2022-05-07: qty 15, 30d supply, fill #0

## 2022-05-07 MED ORDER — ROSUVASTATIN CALCIUM 40 MG PO TABS
40.0000 mg | ORAL_TABLET | Freq: Every day | ORAL | 0 refills | Status: DC
  Filled 2022-05-07: qty 30, 30d supply, fill #0

## 2022-05-07 MED ORDER — TRAZODONE HCL 50 MG PO TABS
50.0000 mg | ORAL_TABLET | Freq: Every day | ORAL | 0 refills | Status: AC
  Filled 2022-05-07: qty 30, 30d supply, fill #0

## 2022-05-07 MED ORDER — CLOPIDOGREL BISULFATE 75 MG PO TABS
75.0000 mg | ORAL_TABLET | Freq: Every day | ORAL | 0 refills | Status: DC
  Filled 2022-05-07: qty 30, 30d supply, fill #0

## 2022-05-07 MED ORDER — DOCUSATE SODIUM 100 MG PO CAPS
100.0000 mg | ORAL_CAPSULE | Freq: Every day | ORAL | 0 refills | Status: DC
  Filled 2022-05-07: qty 30, 30d supply, fill #0

## 2022-05-07 MED ORDER — ASPIRIN 81 MG PO TBEC
81.0000 mg | DELAYED_RELEASE_TABLET | Freq: Every day | ORAL | 12 refills | Status: AC
  Filled 2022-05-07: qty 30, 30d supply, fill #0

## 2022-05-07 MED ORDER — FAMOTIDINE 40 MG PO TABS
40.0000 mg | ORAL_TABLET | ORAL | 0 refills | Status: AC
  Filled 2022-05-07: qty 15, 30d supply, fill #0

## 2022-05-07 MED ORDER — PANTOPRAZOLE SODIUM 20 MG PO TBEC
20.0000 mg | DELAYED_RELEASE_TABLET | ORAL | 0 refills | Status: AC
  Filled 2022-05-07: qty 15, 30d supply, fill #0

## 2022-05-07 MED ORDER — CARVEDILOL 6.25 MG PO TABS
6.2500 mg | ORAL_TABLET | Freq: Two times a day (BID) | ORAL | 0 refills | Status: DC
  Filled 2022-05-07: qty 60, 30d supply, fill #0

## 2022-05-07 MED ORDER — DOXYCYCLINE HYCLATE 100 MG PO TABS
100.0000 mg | ORAL_TABLET | Freq: Two times a day (BID) | ORAL | 0 refills | Status: DC
  Filled 2022-05-07: qty 2, 1d supply, fill #0

## 2022-05-07 MED ORDER — ACETAMINOPHEN 325 MG PO TABS: 325.0000 mg | ORAL_TABLET | ORAL | Status: DC | PRN

## 2022-05-07 MED ORDER — FERROUS SULFATE 325 (65 FE) MG PO TABS
325.0000 mg | ORAL_TABLET | Freq: Every day | ORAL | 0 refills | Status: AC
  Filled 2022-05-07: qty 30, 30d supply, fill #0

## 2022-05-07 NOTE — Progress Notes (Signed)
Occupational Therapy Discharge Summary  Patient Details  Name: Damon Walker MRN: [id number] Date of Birth: 1946-05-18  Date of Discharge from [place]  Today's Date: 05/07/2022 OT Individual Time: 4098-1191 OT Individual Time Calculation (min): 50 min   OT treatment session focused on increased independence within self-care tasks and pt/family education. Educated on Stroke Support Group, tub transfer bench, safety awareness, and providing supervision within bADL tasks. Pt still needs intermitent cues for sequencing and apraxia with objects at times. See functional navigator below for details regarding BADL performance .   Patient has met 12 of 12 long term goals due to improved activity tolerance, improved balance, postural control, ability to compensate for deficits, improved attention, improved awareness, and improved coordination.  Patient to discharge at overall Supervision level.  Patient's care partner is independent to provide the necessary cognitive assistance at discharge.    Reasons goals not met: n/a  Recommendation:  Patient will benefit from ongoing skilled OT services in outpatient setting to continue to advance functional skills in the area of BADL and Reduce care partner burden.  Equipment: Tub transfer bench, wife ordering privately  Reasons for discharge: treatment goals met and discharge from hospital  Patient/family agrees with progress made and goals achieved: Yes  OT Discharge Precautions/Restrictions  Precautions Precautions: Fall Restrictions Weight Bearing Restrictions: No Pain  Denies pain ADL ADL Eating: Set up Grooming: Supervision/safety Upper Body Bathing: Supervision/safety Lower Body Bathing: Supervision/safety Upper Body Dressing: Supervision/safety Lower Body Dressing: Supervision/safety Toileting: Independent Toilet Transfer: Independent Tub/Shower Transfer: Close supervison Perception  Perception: Impaired  (continues to have mild L innatention) Praxis Praxis: Impaired Praxis Impairment Details: Ideation;Motor planning;Initiation;Ideomotor Cognition Cognition Overall Cognitive Status: Impaired/Different from baseline Arousal/Alertness: Awake/alert Memory: Appears intact Attention: Sustained Focused Attention: Appears intact Sustained Attention: Appears intact Awareness: Impaired Awareness Impairment: Emergent impairment Problem Solving: Impaired Problem Solving Impairment: Verbal basic;Functional basic Behaviors: Impulsive Safety/Judgment: Impaired Brief Interview for Mental Status (BIMS) Repetition of Three Words (First Attempt): 1 Temporal Orientation: Year: Correct Temporal Orientation: Month: Accurate within 5 days Temporal Orientation: Day: Incorrect Recall: "Sock": No, could not recall Recall: "Blue": No, could not recall Recall: "Bed": No, could not recall BIMS Summary Score: 6 Sensation Sensation Light Touch: Appears Intact Hot/Cold: Appears Intact Coordination Gross Motor Movements are Fluid and Coordinated: No Fine Motor Movements are Fluid and Coordinated: No Motor  Motor Motor - Discharge Observations: Impaired coordination- improved since evaluation Mobility  Bed Mobility Bed Mobility: Rolling Right;Rolling Left;Supine to Sit;Sit to Supine Rolling Right: Independent Rolling Left: Independent Supine to Sit: Independent Sit to Supine: Independent Transfers Sit to Stand: Independent Stand to Sit: Independent  Balance Static Sitting Balance Static Sitting - Balance Support: Feet supported;Bilateral upper extremity supported Static Sitting - Level of Assistance: 6: Modified independent (Device/Increase time) Dynamic Sitting Balance Dynamic Sitting - Balance Support: Feet supported;During functional activity Dynamic Sitting - Level of Assistance: 6: Modified independent (Device/Increase time) Static Standing Balance Static Standing - Balance Support: During  functional activity;No upper extremity supported Static Standing - Level of Assistance: 6: Modified independent (Device/Increase time) Dynamic Standing Balance Dynamic Standing - Balance Support: During functional activity;No upper extremity supported Dynamic Standing - Level of Assistance: 6: Modified independent (Device/Increase time);5: Stand by assistance Extremity/Trunk Assessment RUE Assessment RUE Assessment: Within Functional Limits LUE Assessment LUE Assessment: Within Functional Limits   Daneen Schick Nathasha Fiorillo 05/07/2022, 3:15 PM

## 2022-05-07 NOTE — Plan of Care (Signed)
  Problem: RH Comprehension Communication Goal: LTG Patient will comprehend basic/complex auditory (SLP) Description: LTG: Patient will comprehend basic/complex auditory information with cues (SLP). Outcome: Completed/Met   Problem: RH Expression Communication Goal: LTG Patient will express needs/wants via multi-modal(SLP) Description: LTG:  Patient will express needs/wants via multi-modal communication (gestures/written, etc) with cues (SLP) Outcome: Completed/Met   Problem: RH Awareness Goal: LTG: Patient will demonstrate awareness during functional activites type of (SLP) Description: LTG: Patient will demonstrate awareness during functional activites type of (SLP) Outcome: Completed/Met   Problem: RH Attention Goal: LTG Patient will demonstrate this level of attention during functional activites (SLP) Description: LTG:  Patient will will demonstrate this level of attention during functional activites (SLP) Outcome: Completed/Met

## 2022-05-07 NOTE — Discharge Instructions (Addendum)
Inpatient Rehab Discharge Instructions  Damon Walker Discharge date and time:  05/08/22  Activities/Precautions/ Functional Status: Activity: no lifting, driving, or strenuous exercise till cleared by MD Diet: cardiac diet and diabetic diet Wound Care: keep wound clean and dry. Contact Dr. Scot Dock if you develop any problems with your incision/wound--redness, swelling, increase in pain, drainage or if you develop fever or chills.     Functional status:  ___ No restrictions     ___ Walk up steps independently _X__ 24/7 supervision/assistance   ___ Walk up steps with assistance ___ Intermittent supervision/assistance  ___ Bathe/dress independently ___ Walk with walker     _X__ Bathe/dress with assistance ___ Walk Independently    ___ Shower independently _X__ Walk with assistance    ___ Shower with assistance ___ No alcohol     ___ Return to work/school ________  Special Instructions:  Family will need to manage and dispense medications.  2.   Needs supervision with all activity.     COMMUNITY REFERRALS UPON DISCHARGE:     Outpatient: PT   OT   SP             Agency:[facility]  [address] [place] [address]   Phone:[phone]              Appointment Date/Time:WILL CALL WIFE TO SET UP FOLLOW UP APPOINTMENTS  Medical Equipment/Items Ordered:NONE NEEDED                                                 Agency/Supplier:NA   STROKE/TIA DISCHARGE INSTRUCTIONS SMOKING Cigarette smoking nearly doubles your risk of having a stroke & is the single most alterable risk factor  If you smoke or have smoked in the last 12 months, you are advised to quit smoking for your health. Most of the excess cardiovascular risk related to smoking disappears within a year of stopping. Ask you doctor about anti-smoking medications [place] Quit Line: 1-800-QUIT NOW Free Smoking Cessation Classes [phone]  CHOLESTEROL Know your levels; limit fat & cholesterol  in your diet  Lipid Panel     Component Value Date/Time   CHOL 86 05/01/2022 0635   TRIG 104 05/01/2022 0635   HDL 22 (L) 05/01/2022 0635   CHOLHDL 3.9 05/01/2022 0635   VLDL 21 05/01/2022 0635   LDLCALC 43 05/01/2022 0635     Many patients benefit from treatment even if their cholesterol is at goal. Goal: Total Cholesterol (CHOL) less than 160 Goal:  Triglycerides (TRIG) less than 150 Goal:  HDL greater than 40 Goal:  LDL (LDLCALC) less than 100   BLOOD PRESSURE American Stroke Association blood pressure target is less that 120/80 mm/Hg  Your discharge blood pressure is:  BP: (!) 128/47 Monitor your blood pressure Limit your salt and alcohol intake Many individuals will require more than one medication for high blood pressure  DIABETES (A1c is a blood sugar average for last 3 months) Goal HGBA1c is under 7% (HBGA1c is blood sugar average for last 3 months)  Diabetes:     Lab Results  Component Value Date   HGBA1C 5.8 (H) 04/22/2022    Your HGBA1c can be lowered with medications, healthy diet, and exercise. Check your blood sugar as directed by your physician Call your physician if you experience unexplained or low blood sugars.  PHYSICAL ACTIVITY/REHABILITATION Goal  is 30 minutes at least 4 days per week  Activity: No driving, Therapies: see above Return to work:  N/A Activity decreases your risk of heart attack and stroke and makes your heart stronger.  It helps control your weight and blood pressure; helps you relax and can improve your mood. Participate in a regular exercise program. Talk with your doctor about the best form of exercise for you (dancing, walking, swimming, cycling).  DIET/WEIGHT Goal is to maintain a healthy weight  Your discharge diet is:  Diet Order             Diet Carb Modified Fluid consistency: Thin; Room service appropriate? Yes  Diet effective now                   liquids Your height is:  Height: '5\' 8"'$  (172.7 cm) Your current weight  is: Weight: 83 kg Your Body Mass Index (BMI) is:  BMI (Calculated): 27.83 Following the type of diet specifically designed for you will help prevent another stroke. Your goal weight range is:   Your goal Body Mass Index (BMI) is 19-24. Healthy food habits can help reduce 3 risk factors for stroke:  High cholesterol, hypertension, and excess weight.  RESOURCES Stroke/Support Group:  Call [phone]   STROKE EDUCATION PROVIDED/REVIEWED AND GIVEN TO PATIENT Stroke warning signs and symptoms How to activate emergency medical system (call 911). Medications prescribed at discharge. Need for follow-up after discharge. Personal risk factors for stroke. Pneumonia vaccine given:  Flu vaccine given:  My questions have been answered, the writing is legible, and I understand these instructions.  I will adhere to these goals & educational materials that have been provided to me after my discharge from the hospital.      My questions have been answered and I understand these instructions. I will adhere to these goals and the provided educational materials after my discharge from the hospital.  Patient/Caregiver Signature _______________________________ Date __________  Clinician Signature _______________________________________ Date __________  Please bring this form and your medication list with you to all your follow-up doctor's appointments.

## 2022-05-07 NOTE — Discharge Summary (Signed)
Physician Discharge Summary  Patient ID: Damon Walker MRN: [id number] DOB/AGE: [birth year] 76 y.o.  Admit date: 05/01/2022 Discharge date: 05/08/2022  Discharge Diagnoses:  Principal Problem:   Acute ischemic left middle cerebral artery (MCA) stroke (HCC) Active Problems:   Essential hypertension   Prediabetes   Depression   GERD (gastroesophageal reflux disease)   Malnutrition of moderate degree   Acute blood loss anemia   Cellulitis--right forearm   Discharged Condition: stable  Significant Diagnostic Studies:   Labs:  Basic Metabolic Panel:    Latest Ref Rng & Units 05/06/2022    6:59 AM 05/02/2022    6:49 AM 05/01/2022    6:35 AM  BMP  Glucose 70 - 99 mg/dL 106  96  129   BUN 8 - 23 mg/dL '15  13  16   '$ Creatinine 0.61 - 1.24 mg/dL 1.21  1.01  1.08   Sodium 135 - 145 mmol/L 140  139  140   Potassium 3.5 - 5.1 mmol/L 4.0  4.1  3.7   Chloride 98 - 111 mmol/L 109  110  108   CO2 22 - 32 mmol/L '25  24  24   '$ Calcium 8.9 - 10.3 mg/dL 8.7  8.3  8.4      CBC:    Latest Ref Rng & Units 05/06/2022    6:59 AM 05/02/2022    6:49 AM 05/01/2022    6:35 AM  CBC  WBC 4.0 - 10.5 K/uL 8.6  7.9  12.1   Hemoglobin 13.0 - 17.0 g/dL 12.4  12.0  11.8   Hematocrit 39.0 - 52.0 % 37.0  35.1  34.3   Platelets 150 - 400 K/uL 272  197  210      CBG: No results for input(s): "GLUCAP" in the last 168 hours.   Brief HPI:   Damon Walker is a 76 y.o. RH-male with history of combined CHF, left-PCA CVA with right hemianopsia, CKD, CAS was admitted on 04/21/2022 with onset of confusion and speech difficulty.  CT perfusion showed abnormal perfusion involving right temporal and left parietal lobe and 50% left ICA stenosis.  MRI brain showed acute infarct left parietal lobe with arachnoid cyst right middle cranial fossa dissecting lateral into temporal lobe.  2D echo showed global hypokinesis with EF 30 to 35%.  Dr. Erlinda Hong felt that left MCA infarct likely from left ICA stenosis/plaque and  recommended 30-day CardioNet monitoring to rule out A-fib on outpatient basis.  Dr. Doren Custard was consulted for input and patient underwent TCAR on 09/26.  He has had bouts of confusion and agitation during his stay and this was improving.  He continued be limited by cognitive deficits with weakness and CIR was recommended due to functional decline.    Hospital Course: Meagan Spease was admitted to rehab 05/01/2022 for inpatient therapies to consist of PT, ST and OT at least three hours five days a week. Past admission physiatrist, therapy team and rehab RN have worked together to provide customized collaborative inpatient rehab.hi blood pressures were monitored on TID basis and was noted to be soft therefore Coreg was decreased to 6.25 mg twice daily.  Follow-up check of electrolytes showed serum creatinine to be improving.   Follow-up CBC shows acute blood loss anemia is improving and reactive leukocytosis has resolved.  Carb modified restrictions were added due to history of prediabetes.  He was found to have cellulitis right forearm which has resolved with 7-day course of doxycycline.  Nutritional supplements were added to help with low  calorie malnutrition and to promote wound healing.  Family was also encouraged to bring food from home to supplement intake.  Neck incision is clean, dry and intact and is healing well without signs or symptoms of infection.  Vascular recommended continuing DAPT for at least 30 days and he is to follow-up with them for final decision on discontinuation of aspirin. Trazodone was added to help manage chronic sleeping disruption and has been effective.  His endurance is improving and he progressed to min assist level.  Length of stay was shortened per family request.  He will continue to receive follow-up outpatient PT, OT and ST at [facility] outpatient rehab after discharge   Rehab course: During patient's stay in rehab weekly team conferences were held to monitor patient's  progress, set goals and discuss barriers to discharge. At admission, patient required min assist with mobility and ADL tasks. He exhibited severe to profound receptive > expressive aphasia with limited responses and was also limited by hearing loss. He  has had improvement in activity tolerance, balance, postural control as well as ability to compensate for deficits. He is able to complete ADL tasks with supervision. He is independent for transfers and requires supervision to ambulate 300' without AD.  He required mod to max assist with cognitive tasks as is 50% accurate for Y/N questions, is unable to correct for error for near 50% times and needs min semantic cues for expressive deficits. Family requested shorter length of stay as they fell they could provide care needed and education was  completed prior to discharge.   Disposition: Home  Diet: Heart healthy/Carb modified.    Special Instructions: Continue DAPT till follow up with Vascular surgery.  2.  Family to assist with medication management and provide supervision for safety.   Discharge Instructions     Ambulatory referral to Neurology   Complete by: As directed    An appointment is requested in approximately: 2-3 weeks      Allergies as of 05/08/2022       Reactions   Latex Hives, Itching, Rash, Other (See Comments)   Blisters        Medication List     STOP taking these medications    furosemide 20 MG tablet Commonly known as: LASIX   losartan 25 MG tablet Commonly known as: COZAAR       TAKE these medications    acetaminophen 325 MG tablet Commonly known as: TYLENOL Take 1-2 tablets (325-650 mg total) by mouth every 4 (four) hours as needed for mild pain.   Aspirin Low Dose 81 MG tablet Generic drug: aspirin EC Take 1 tablet (81 mg total) by mouth daily. What changed: additional instructions   carvedilol 6.25 MG tablet Commonly known as: COREG Take 1 tablet (6.25 mg total) by mouth 2 (two) times  daily with a meal. What changed:  medication strength how much to take when to take this   clopidogrel 75 MG tablet Commonly known as: PLAVIX Take 1 tablet (75 mg total) by mouth daily.   docusate sodium 100 MG capsule Commonly known as: COLACE Take 1 capsule (100 mg total) by mouth daily.   doxycycline 100 MG tablet Commonly known as: VIBRA-TABS Take 1 tablet (100 mg total) by mouth every 12 (twelve) hours.   famotidine 40 MG tablet Commonly known as: PEPCID Take 1 tablet (40 mg total) by mouth every other day.   FeroSul 325 (65 FE) MG tablet Generic drug: ferrous sulfate Take 1 tablet (325 mg total)  by mouth daily with breakfast.   nitroGLYCERIN 0.4 MG SL tablet Commonly known as: NITROSTAT Place 1 tablet (0.4 mg total) under the tongue every 5 (five) minutes x 3 doses as needed for chest pain.   pantoprazole 20 MG tablet Commonly known as: PROTONIX Take 1 tablet (20 mg total) by mouth every other day.   rosuvastatin 40 MG tablet Commonly known as: CRESTOR Take 1 tablet (40 mg total) by mouth daily at 6 PM.   sertraline 25 MG tablet Commonly known as: ZOLOFT Take 0.5 tablets (12.5 mg total) by mouth at bedtime.   traZODone 50 MG tablet Commonly known as: DESYREL Take 1 tablet (50 mg total) by mouth at bedtime.        Follow-up Information     [name], MD Follow up.   Specialty: Family Medicine Why: Call in 1-2 days for post hospital follow up Contact information: [place] [address] [phone]         [facility] Follow up.   Why: office will call you with follow up appointment Contact information: [place] [address] [phone]        Jennye Boroughs, MD Follow up.   Specialty: Physical Medicine and Rehabilitation Why: As needed Contact information: [address] [place] [address] [phone]                  Signed: Bary Leriche 05/08/2022, 5:40 PM

## 2022-05-07 NOTE — Progress Notes (Signed)
PROGRESS NOTE   Subjective/Complaints: Pt was seen in gym this morning, he was working with therapy. No new concerns or complaints.  He is scheduled for discharge tomorrow.  Wife to come tomorrow for family education.   LBM 10/2   ROS: Limited due to cognitive/behavioral    Objective:   No results found. Recent Labs    05/06/22 0659  WBC 8.6  HGB 12.4*  HCT 37.0*  PLT 272    Recent Labs    05/06/22 0659  NA 140  K 4.0  CL 109  CO2 25  GLUCOSE 106*  BUN 15  CREATININE 1.21  CALCIUM 8.7*     Intake/Output Summary (Last 24 hours) at 05/07/2022 4098 Last data filed at 05/06/2022 1814 Gross per 24 hour  Intake 583 ml  Output --  Net 583 ml         Physical Exam: Vital Signs Blood pressure (!) 124/58, pulse (!) 59, temperature 98 F (36.7 C), temperature source Oral, resp. rate 18, height '5\' 8"'$  (1.727 m), weight 82.5 kg, SpO2 98 %.  General: Alert, No apparent distress. Working with therapy HEENT: MMM, conjugate gaze Neck: Supple without JVD or lymphadenopathy Heart: Reg rate and rhythm. No murmurs rubs or gallops Chest: CTA bilaterally without wheezes, rales, or rhonchi; good air movement, no distress Abdomen: Soft, non-tender, non-distended, bowel sounds positive. Extremities: No clubbing, cyanosis, or edema. Pulses are 2+ Psych: Pt's affect is appropriate.  Skin: C/D/I.  Neuro: Alert and oriented to self; place and time with cues. Aphasia, with neologisms and paraphasias, Followed one step commands intermittently. Very HOH.  Musculoskeletal: Moving all 4 extremities in bed, antigravity.    Assessment/Plan: 1. Functional deficits which require 3+ hours per day of interdisciplinary therapy in a comprehensive inpatient rehab setting. Physiatrist is providing close team supervision and 24 hour management of active medical problems listed below. Physiatrist and rehab team continue to assess barriers  to discharge/monitor patient progress toward functional and medical goals  Care Tool:  Bathing    Body parts bathed by patient: Right arm, Left arm, Chest, Abdomen, Front perineal area, Buttocks, Right upper leg, Left upper leg, Right lower leg, Left lower leg, Face         Bathing assist Assist Level: Minimal Assistance - Patient > 75%     Upper Body Dressing/Undressing Upper body dressing   What is the patient wearing?: Pull over shirt    Upper body assist Assist Level: Set up assist    Lower Body Dressing/Undressing Lower body dressing      What is the patient wearing?: Pants, Underwear/pull up     Lower body assist Assist for lower body dressing: Minimal Assistance - Patient > 75%     Toileting Toileting    Toileting assist Assist for toileting: Minimal Assistance - Patient > 75%     Transfers Chair/bed transfer  Transfers assist     Chair/bed transfer assist level: Contact Guard/Touching assist     Locomotion Ambulation   Ambulation assist      Assist level: Supervision/Verbal cueing Assistive device: No Device Max distance: 200+   Walk 10 feet activity   Assist     Assist  level: Supervision/Verbal cueing Assistive device: No Device   Walk 50 feet activity   Assist    Assist level: Supervision/Verbal cueing Assistive device: No Device    Walk 150 feet activity   Assist    Assist level: Supervision/Verbal cueing Assistive device: No Device    Walk 10 feet on uneven surface  activity   Assist     Assist level: Supervision/Verbal cueing Assistive device: Other (comment)   Wheelchair     Assist Is the patient using a wheelchair?: No             Wheelchair 50 feet with 2 turns activity    Assist            Wheelchair 150 feet activity     Assist          Blood pressure (!) 124/58, pulse (!) 59, temperature 98 F (36.7 C), temperature source Oral, resp. rate 18, height '5\' 8"'$  (1.727 m),  weight 82.5 kg, SpO2 98 %.  Medical Problem List and Plan: 1. 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 Needs hearing aids in for therapy!  -Continue CIR PT, OT, and SLP  2.  Antithrombotics: -DVT/anticoagulation:  Pharmaceutical: Lovenox             -antiplatelet therapy: ASA d/c today and to continue Plavix.  3. Pain Management: Oxycodone prn.  4. Mood/Behavior/Sleep: LCSW to follow for evaluation and support.              -antipsychotic agents: Seroquel prn for agitation.              --Per family--gets up at  3 am then sleeps till late am and off/on in daytime.   -will check sleep chart and begin scheduled trazodone for sleep to help better normalize his sleep-wake cycle. May be hard to change however. -9/29 Consider increase trazodone dose if he continues to have poor sleep 5. Neuropsych/cognition: This patient is not fully capable of making decisions on his own behalf. 6. Skin/Wound Care: Routine pressure relief measures.  7. Fluids/Electrolytes/Nutrition: encourage PO  -I personally reviewed the patient's labs today.    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  9. Delirium: Resolving. Continue Seroquel prn as really sedated him --hx confusion with last stroke. See above -improved 10. Chronic combined CHF:    --Monitor for signs of overload.             --Continue Coreg, Plavix and Crestor.              --add Cardiac restrictions to diet.   -check daily weights - downtrending Filed Weights   05/05/22 0500 05/06/22 0500 05/07/22 0500  Weight: 84.8 kg 84.1 kg 82.5 kg    11. CKD III: Monitor with serial checks. Improved with continuous fluids on board.             --d/c fluids to avoid overload. CXR with mild pulmonary edema pre-op.    -10/2 Cr stable at 1.21 12. HTN: Monitor BP TID--on coreg.   -fair  control.  Monitor for pattern  9/29 decrease coreg to 6.25 BID  10/3 BP well controlled, HR continues to be borderline low/stable    05/07/2022  5:00 AM 05/07/2022    2:47 AM 05/06/2022    7:52 PM  Vitals with BMI  Weight 181 lbs 14 oz    BMI 55.73    Systolic  220 254  Diastolic  58 78  Pulse  59 58     13. Pre-diabetes: Hgb A1C-5.8. Add CM restrictions to diet.              --RD consult for dietary education  -CBGs were discontinued, glucose 106 on BMP-stable  14. HH/GERD: Managed --alternates  Protonix with Pepcid every other day.  15. Cellulitis Right forearm: Secondary to infiltrated IV--started 2-3 days ago per wife. --started warm compresses and doxycycline for treatment.  -10/2 improved 16. Moderate malnutrition  -Dietician following, Glucerna TID started             - wife encouraging POs with outside food.        LOS: 6 days A FACE TO FACE EVALUATION WAS PERFORMED  Jennye Boroughs 05/07/2022, 8:08 AM

## 2022-05-07 NOTE — Progress Notes (Signed)
Inpatient Rehabilitation Care Coordinator Discharge Note   Patient Details  Name: Damon Walker MRN: [id number] Date of Birth: [birth year]   Discharge location: HOME WITH WIFE WHO HAS TAKEN OFF OF WORK TO PROVIDE 24/7 SUPERVISION  Length of Stay: 7 DAYS  Discharge activity level: SUPERVISION LEVEL  Home/community participation: [place]  Patient response IR:[id number] Literacy - How often do you need to have someone help you when you read instructions, pamphlets, or other written material from your doctor or pharmacy?: Rarely  Patient response MG:QQPYPP Isolation - How often do you feel lonely or isolated from those around you?: Never  Services provided included: MD, RD, PT, OT, SLP, RN, CM, Pharmacy, SW  Financial Services:  Charity fundraiser Utilized: [place] offered to/list presented to: PT AND WIFE  Follow-up services arranged:  Outpatient, Patient/Family has no preference for HH/DME agencies    Outpatient Servicies: [facility]PT OT SP WILL CALL WIFE TO SCEDULE FOLLOW UP APPOINTMENTS DME : NONE NEEDED    Patient response to transportation need: Is the patient able to respond to transportation needs?: Yes In the past 12 months, has lack of transportation kept you from medical appointments or from getting medications?: No In the past 12 months, has lack of transportation kept you from meetings, work, or from getting things needed for daily living?: No    Comments (or additional information):WIFE WAS HERE [place] FEELS COMFORTABLE WITH LEVELS. BOTH FEEL READY TO GO HOME  Patient/Family verbalized understanding of follow-up arrangements:  Yes  Individual responsible for coordination of the follow-up plan: Damon Walker [phone]  Confirmed correct DME delivered: Elease Hashimoto 05/07/2022    Elease Hashimoto

## 2022-05-07 NOTE — Plan of Care (Signed)
  Problem: RH Balance Goal: LTG Patient will maintain dynamic standing with ADLs (OT) Description: LTG:  Patient will maintain dynamic standing balance with assist during activities of daily living (OT)  Outcome: Completed/Met   Problem: Sit to Stand Goal: LTG:  Patient will perform sit to stand in prep for activites of daily living with assistance level (OT) Description: LTG:  Patient will perform sit to stand in prep for activites of daily living with assistance level (OT) Outcome: Completed/Met   Problem: RH Grooming Goal: LTG Patient will perform grooming w/assist,cues/equip (OT) Description: LTG: Patient will perform grooming with assist, with/without cues using equipment (OT) Outcome: Completed/Met   Problem: RH Bathing Goal: LTG Patient will bathe all body parts with assist levels (OT) Description: LTG: Patient will bathe all body parts with assist levels (OT) Outcome: Completed/Met   Problem: RH Dressing Goal: LTG Patient will perform upper body dressing (OT) Description: LTG Patient will perform upper body dressing with assist, with/without cues (OT). Outcome: Completed/Met Goal: LTG Patient will perform lower body dressing w/assist (OT) Description: LTG: Patient will perform lower body dressing with assist, with/without cues in positioning using equipment (OT) Outcome: Completed/Met   Problem: RH Toileting Goal: LTG Patient will perform toileting task (3/3 steps) with assistance level (OT) Description: LTG: Patient will perform toileting task (3/3 steps) with assistance level (OT)  Outcome: Completed/Met   Problem: RH Toilet Transfers Goal: LTG Patient will perform toilet transfers w/assist (OT) Description: LTG: Patient will perform toilet transfers with assist, with/without cues using equipment (OT) Outcome: Completed/Met   Problem: RH Tub/Shower Transfers Goal: LTG Patient will perform tub/shower transfers w/assist (OT) Description: LTG: Patient will perform  tub/shower transfers with assist, with/without cues using equipment (OT) Outcome: Completed/Met   Problem: RH Attention Goal: LTG Patient will demonstrate this level of attention during functional activites (OT) Description: LTG:  Patient will demonstrate this level of attention during functional activites  (OT) Outcome: Completed/Met   Problem: RH Awareness Goal: LTG: Patient will demonstrate awareness during functional activites type of (OT) Description: LTG: Patient will demonstrate awareness during functional activites type of (OT) Outcome: Completed/Met   

## 2022-05-07 NOTE — Progress Notes (Signed)
Physical Therapy Session Note  Patient Details  Name: Damon Walker MRN: [id number] Date of Birth: 11-Aug-1945  Today's Date: 05/07/2022 PT Individual Time: 1033-1130 PT Individual Time Calculation (min): 57 min   Short Term Goals: Week 1:  PT Short Term Goal 1 (Week 1): STG=LTG secondary to ELOS  Skilled Therapeutic Interventions/Progress Updates: Pt presents sitting EOB w/ spouse present.  Pt agreeable to therapy.  Pt amb to dayroom w/ supervision for occasional obstacle to left.  Pt performed Nu-step at Level 6 x 9', although cued pt to stop at 8'.  Pt requires visual cue to manage time "in upper left hand corner."  Pt amb 400' down hallways w/ cueing to look for objects such as " the 3rd door on left" or "exit sign", but only able to perform 25% of time w/o continued reminders throughout gait.  Pt performed step-ups to 8" platform forward and sidestep to right, but unable to perform sidestepping continually, pt tries to turn to step forward.  Pt returns to room and transfers sit to supine w/ independence.  Bed alarm on and all needs in reach, spouse remains present and received family ed earlier this AM.     Therapy Documentation Precautions:  Precautions Precautions: Fall Precaution Comments: HOH - hearing aides in room Restrictions Weight Bearing Restrictions: No General:   Vital Signs:  Pain:0/10   Mobility:   Locomotion : Gait Ambulation: Yes Gait Assistance: Supervision/Verbal cueing Gait Distance (Feet): 300 Feet Assistive device: None Gait Assistance Details: VC for directions secondary to cognitive deficits Gait Gait: Yes Gait Pattern: Shuffle;Antalgic;Narrow base of support Gait velocity: Decreased Stairs / Additional Locomotion Stairs: Yes Stairs Assistance: Supervision/Verbal cueing Stair Management Technique: Two rails Number of Stairs: 12 Height of Stairs: 6 Ramp: Supervision/Verbal cueing Curb: Supervision/Verbal cueing Wheelchair  Mobility Wheelchair Mobility: No  Trunk/Postural Assessment : Cervical Assessment Cervical Assessment: Exceptions to [facility] (Forward head) Thoracic Assessment Thoracic Assessment: Exceptions to [facility] (Rounded shoulders with B shoulder elevation) Lumbar Assessment Lumbar Assessment: Exceptions to [facility] (Posterior pelvc tilt) Postural Control Postural Control: Deficits on evaluation (Delayed)  Balance: Balance Balance Assessed: Yes Berg Balance Test Sit to Stand: Able to stand without using hands and stabilize independently Standing Unsupported: Able to stand safely 2 minutes Sitting with Back Unsupported but Feet Supported on Floor or Stool: Able to sit safely and securely 2 minutes Stand to Sit: Sits safely with minimal use of hands Transfers: Able to transfer safely, minor use of hands Standing Unsupported with Eyes Closed: Able to stand 10 seconds safely Standing Ubsupported with Feet Together: Able to place feet together independently and stand 1 minute safely From Standing, Reach Forward with Outstretched Arm: Can reach confidently >25 cm (10") From Standing Position, Pick up Object from Floor: Able to pick up shoe safely and easily From Standing Position, Turn to Look Behind Over each Shoulder: Looks behind from both sides and weight shifts well Turn 360 Degrees: Able to turn 360 degrees safely in 4 seconds or less Standing Unsupported, Alternately Place Feet on Step/Stool: Able to stand independently and complete 8 steps >20 seconds Standing Unsupported, One Foot in Front: Able to plae foot ahead of the other independently and hold 30 seconds Standing on One Leg: Tries to lift leg/unable to hold 3 seconds but remains standing independently Total Score: 51 Timed Up and Go Test TUG: Normal TUG Normal TUG (seconds): 11.53 (No AD) Static Sitting Balance Static Sitting - Balance Support: Feet supported;Bilateral upper extremity supported Static Sitting - Level of Assistance: 6:  Modified  independent (Device/Increase time) Dynamic Sitting Balance Dynamic Sitting - Balance Support: Feet supported;During functional activity Dynamic Sitting - Level of Assistance: 6: Modified independent (Device/Increase time) Static Standing Balance Static Standing - Balance Support: During functional activity;No upper extremity supported Static Standing - Level of Assistance: 6: Modified independent (Device/Increase time) Dynamic Standing Balance Dynamic Standing - Balance Support: During functional activity;No upper extremity supported Dynamic Standing - Level of Assistance: 6: Modified independent (Device/Increase time);5: Stand by assistance Exercises:   Other Treatments:      Therapy/Group: Individual Therapy  [name] 05/07/2022, 12:18 PM

## 2022-05-07 NOTE — Progress Notes (Signed)
Physical Therapy Discharge Summary  Patient Details  Name: Damon Walker MRN: [id number] Date of Birth: 03-02-1946  Date of Discharge from PT service:May 07, 2022  Today's Date: 05/07/2022 PT Individual Time: 0755-0855 PT Individual Time Calculation (min): 60 min    Patient has met 7 of 7 long term goals due to improved activity tolerance, improved balance, improved postural control, increased strength, improved attention, improved awareness, and improved coordination.  Patient to discharge at an ambulatory level Supervision.   Patient's care partner is independent to provide the necessary cognitive assistance at discharge.  Reasons goals not met: NA  Recommendation:  Patient will benefit from ongoing skilled PT services in outpatient setting to continue to advance safe functional mobility, address ongoing impairments in impaired cognition, impaired awareness/perception, impaired dynamic stability and minimize fall risk.  Equipment: No equipment provided  Reasons for discharge: treatment goals met and discharge from hospital  Patient/family agrees with progress made and goals achieved: Yes  PT Discharge Precautions/Restrictions Precautions Precautions: Fall Precaution Comments: HOH - hearing aides in room Restrictions Weight Bearing Restrictions: No Pain Interference Pain Interference Pain Effect on Sleep: 1. Rarely or not at all Pain Interference with Therapy Activities: 1. Rarely or not at all Pain Interference with Day-to-Day Activities: 1. Rarely or not at all Vision/Perception  Vision - History Ability to See in Adequate Light: 0 Adequate Perception Perception: Impaired Praxis Praxis: Impaired Praxis Impairment Details: Ideation;Motor planning;Initiation;Ideomotor  Cognition Overall Cognitive Status: Impaired/Different from baseline Arousal/Alertness: Awake/alert Orientation Level: Oriented to person;Oriented to place Year: 2023 Month: October Day of Week:  Incorrect Attention: Sustained Focused Attention: Appears intact Sustained Attention: Impaired Memory: Impaired Awareness: Impaired Problem Solving: Impaired Safety/Judgment: Impaired Sensation Sensation Light Touch: Appears Intact Hot/Cold: Appears Intact Coordination Gross Motor Movements are Fluid and Coordinated: No Fine Motor Movements are Fluid and Coordinated: No Coordination and Movement Description: Decreased coordination Finger Nose Finger Test: Continues to under and overshoot, however improved since evaluation Motor  Motor Motor: Motor apraxia Motor - Discharge Observations: Impaired coordination- improved since evaluation  Mobility Bed Mobility Bed Mobility: Rolling Right;Rolling Left;Supine to Sit;Sit to Supine Rolling Right: Independent Rolling Left: Independent Supine to Sit: Independent Sit to Supine: Independent Transfers Transfers: Sit to Stand;Stand to [employer] Transfers Sit to Stand: Independent Stand to Sit: Independent Stand Pivot Transfers: Independent Transfer (Assistive device): None Locomotion  Gait Ambulation: Yes Gait Assistance: Supervision/Verbal cueing Gait Distance (Feet): 300 Feet Assistive device: None Gait Assistance Details: VC for directions secondary to cognitive deficits Gait Gait: Yes Gait Pattern: Shuffle;Antalgic;Narrow base of support Gait velocity: Decreased Stairs / Additional Locomotion Stairs: Yes Stairs Assistance: Supervision/Verbal cueing Stair Management Technique: Two rails Number of Stairs: 12 Height of Stairs: 6 Ramp: Supervision/Verbal cueing Curb: Supervision/Verbal cueing Pick up small object from the floor assist level: Independent Pick up small object from the floor assistive device: No AD Wheelchair Mobility Wheelchair Mobility: No  Trunk/Postural Assessment  Cervical Assessment Cervical Assessment: Exceptions to [facility] (Forward head) Thoracic Assessment Thoracic Assessment: Exceptions to [facility]  (Rounded shoulders with B shoulder elevation) Lumbar Assessment Lumbar Assessment: Exceptions to [facility] (Posterior pelvc tilt) Postural Control Postural Control: Deficits on evaluation (Delayed)  Balance Balance Balance Assessed: Yes Berg Balance Test Sit to Stand: Able to stand without using hands and stabilize independently Standing Unsupported: Able to stand safely 2 minutes Sitting with Back Unsupported but Feet Supported on Floor or Stool: Able to sit safely and securely 2 minutes Stand to Sit: Sits safely with minimal use of hands Transfers: Able to  transfer safely, minor use of hands Standing Unsupported with Eyes Closed: Able to stand 10 seconds safely Standing Ubsupported with Feet Together: Able to place feet together independently and stand 1 minute safely From Standing, Reach Forward with Outstretched Arm: Can reach confidently >25 cm (10") From Standing Position, Pick up Object from Floor: Able to pick up shoe safely and easily From Standing Position, Turn to Look Behind Over each Shoulder: Looks behind from both sides and weight shifts well Turn 360 Degrees: Able to turn 360 degrees safely in 4 seconds or less Standing Unsupported, Alternately Place Feet on Step/Stool: Able to stand independently and complete 8 steps >20 seconds Standing Unsupported, One Foot in Front: Able to plae foot ahead of the other independently and hold 30 seconds Standing on One Leg: Tries to lift leg/unable to hold 3 seconds but remains standing independently Total Score: 51 Timed Up and Go Test TUG: Normal TUG Normal TUG (seconds): 11.53 (No AD) Static Sitting Balance Static Sitting - Balance Support: Feet supported;Bilateral upper extremity supported Static Sitting - Level of Assistance: 6: Modified independent (Device/Increase time) Dynamic Sitting Balance Dynamic Sitting - Balance Support: Feet supported;During functional activity Dynamic Sitting - Level of Assistance: 6: Modified independent  (Device/Increase time) Static Standing Balance Static Standing - Balance Support: During functional activity;No upper extremity supported Static Standing - Level of Assistance: 6: Modified independent (Device/Increase time) Dynamic Standing Balance Dynamic Standing - Balance Support: During functional activity;No upper extremity supported Dynamic Standing - Level of Assistance: 6: Modified independent (Device/Increase time);5: Stand by assistance Extremity Assessment      RLE Assessment RLE Assessment: Exceptions to [facility] General Strength Comments: Unable to perform a formal MMT secondary to receptive aphasia with difficuty following/understanding one-step commands; Grossly 3+/5 LLE Assessment LLE Assessment: Exceptions to [facility] General Strength Comments: Unable to perform a formal MMT secondary to receptive aphasia with difficuty following/understanding one-step commands; Grossly 4/5   Skilled Intervention- Patient greeted sitting EOB in room with breakfast tray in front and RN present administering AM medications. Patient agreeable to PT treatment session and denies pain at this time.  Patient ambulated from his room to various rehab gym without AD and supv for directions. Patient performed car transfer, ambulated up/down ramp and over mulch and performed bed mobility independently. Patient ascended/descended x12 steps with B HR and distant supv. Patient also completed the Berg Balance Assessment Patient demonstrates increased fall risk as noted by score of 51/56 on Berg Balance Scale. (<36= high risk for falls, close to 100%; 37-45 significant >80%; 46-51 moderate >50%; 52-55 lower >25%)- Please see above for further details.   Patient ambulated throughout the hallway with spouse in order to demonstrate need for verbal cues for directions, however once home and in is own environment patient may do better and require less supervision. One minor LOB noted throughout session, however patient was  able to appropriately utilize his stepping strategy and regain balance without assistance from PT. Therapist recommending outpatient neuro PT at discharge- Spouse and CSW notified and aware.   Patient left sitting in arm chair in room with spouse present and all needs met.    [place] 05/07/2022, 8:39 AM

## 2022-05-07 NOTE — Progress Notes (Signed)
Speech Language Pathology Discharge Summary  Patient Details  Name: Damon Walker MRN: [id number] Date of Birth: July 15, 1946  Date of Discharge from [place]  Today's Date: 05/07/2022 SLP Individual Time: 0930-0958 SLP Individual Time Calculation (min): 28 min   Skilled Therapeutic Interventions:  Skilled treatment session focused on communication goals. Upon arrival, patient was awake in bed and agreeable to treatment session. Patient sat EOB and had only one hearing aid in place per his preference. Patient was independently oriented to month, year, place and situation. Patient answered basic yes/no questions with 100% accuracy in regards to biographical/environmental information. Patient also followed 1 and 2 step commands with 75% accuracy with written cues needed for clarification. Suspect function impacted by motor planning deficits. Patient performed automatic sequences, conformational naming of functional items and responsive naming tasks with 100% accuracy. Patient's wife present throughout and all questions were answered. Patient left sitting EOB with alarm on and all needs within reach.   Patient has met 4 of 4 long term goals.  Patient to discharge at [facility] Max;Mod level.   Reasons goals not met: N/A  Clinical Impression/Discharge Summary:  Patient has made functional gains and has met 4 of 4 LTGs this admission. Currently, patient continues to demonstrate an overall moderate aphasia impacting both his receptive and expressive language which is further exacerbated by current hearing impairments.  Overall, patient requires Mod A verbal cues to express his basic wants/needs at the phrase level and for comprehension of basic information with multiple modalities needed at times for clarification. Patient also demonstrates improvement in sustained attention during functional tasks. Patient and family education is complete and patient will discharge home with 24 hour  supervision from family. Patient would benefit from f/u SLP services to maximize his cognitive-linguistic functioning and overall functional independence in order to reduce caregiver burden.   Care Partner:  Caregiver Able to Provide Assistance: Yes  Type of Caregiver Assistance: Physical;Cognitive  Recommendation:  24 hour supervision/assistance;Outpatient SLP  Rationale for SLP Follow Up: Reduce caregiver burden;Maximize cognitive function and independence;Maximize functional communication   Equipment: N/A   Reasons for discharge: Discharged from hospital;Treatment goals met   Patient/Family Agrees with Progress Made and Goals Achieved: Yes    Muadh Creasy, [place] 05/07/2022, 7:01 AM

## 2022-05-07 NOTE — Progress Notes (Addendum)
Patient ID: Damon Walker, male   DOB: December 02, 1945, 76 y.o.   MRN: [id number]  Wife concerned due to getting bills that said will not pay due to not filed right. Gave her number for Accounts payable and insurance CMA along with benefits number for the insurance. Wife to follow up with. Wife will not move forward regarding home health or Op until finds out if covered.  1;38 PM Wife has gotten her questions answered regarding the insurance issue supposedly [facility] has not billed [employer] right. When asked about OP versus HH both want OP at [facility] have sent referral to them. They will follow up with wife to schedule appointments. Feels ready for discharge tomorrow.

## 2022-05-07 NOTE — TOC Transition Note (Signed)
11 discharge meds in the [place] for patient until discharge.

## 2022-05-08 DIAGNOSIS — D62 Acute posthemorrhagic anemia: Secondary | ICD-10-CM

## 2022-05-08 DIAGNOSIS — L0390 Cellulitis, unspecified: Secondary | ICD-10-CM

## 2022-05-08 NOTE — Progress Notes (Signed)
Inpatient Rehabilitation Discharge Medication Review by a Pharmacist  A complete drug regimen review was completed for this patient to identify any potential clinically significant medication issues.  High Risk Drug Classes Is patient taking? Indication by Medication  Antipsychotic No   Anticoagulant No   Antibiotic Yes Doxycycline- RUE cellulitis  Opioid No   Antiplatelet Yes ASA, Plavix-CVA  Hypoglycemics/insulin No   Vasoactive Medication Yes Coreg-HTN, CHF  Chemotherapy No   Other Yes Docusate-constipation Famotidine-GERD Pantoprazole-GERD Ferrous sulfate-iron deficiency Miralax-Constipation Crestor-hyperlipidemia Sertraline-Depression Trazodone-sleep     Type of Medication Issue Identified Description of Issue Recommendation(s)  Drug Interaction(s) (clinically significant)     Duplicate Therapy     Allergy     No Medication Administration End Date     Incorrect Dose     Additional Drug Therapy Needed     Significant med changes from prior encounter (inform family/care partners about these prior to discharge).    Other       Clinically significant medication issues were identified that warrant physician communication and completion of prescribed/recommended actions by midnight of the next day:  No  Name of provider notified for urgent issues identified:   Provider Method of Notification:     Pharmacist comments:   Time spent performing this drug regimen review (minutes):  [place], [place] Please utilize Amion for appropriate phone number to reach the unit pharmacist ([place])  05/08/2022 12:47 PM

## 2022-05-08 NOTE — Progress Notes (Signed)
PROGRESS NOTE   Subjective/Complaints: No new concerns this AM. His wife is in the room. Looking forward to going home.   LBM 10/2   ROS: Limited due to cognitive/behavioral    Objective:   No results found. Recent Labs    05/06/22 0659  WBC 8.6  HGB 12.4*  HCT 37.0*  PLT 272    Recent Labs    05/06/22 0659  NA 140  K 4.0  CL 109  CO2 25  GLUCOSE 106*  BUN 15  CREATININE 1.21  CALCIUM 8.7*     Intake/Output Summary (Last 24 hours) at 05/08/2022 0917 Last data filed at 05/07/2022 1344 Gross per 24 hour  Intake 237 ml  Output --  Net 237 ml         Physical Exam: Vital Signs Blood pressure (!) 128/47, pulse (!) 52, temperature 98.5 F (36.9 C), resp. rate 17, height '5\' 8"'$  (1.727 m), weight 83 kg, SpO2 94 %.  General: Alert, No apparent distress. Sitting in chair HEENT: MMM, conjugate gaze Neck: Supple without JVD or lymphadenopathy Heart: Reg rate and rhythm. No murmurs rubs or gallops Chest: CTA bilaterally without wheezes, rales, or rhonchi; good air movement, normal rate Abdomen: Soft, non-tender, non-distended, bowel sounds positive. Extremities: No clubbing, cyanosis, or edema. Pulses are 2+ Psych: Pt's affect is appropriate.  Skin: C/D/I.  Neuro: Alert and oriented to self; place and time with cues. Aphasia, with neologisms and paraphasias, Followed one step commands intermittently. Very HOH.  Musculoskeletal: Moving all 4 extremities in bed, antigravity.    Assessment/Plan: 1. Functional deficits which require 3+ hours per day of interdisciplinary therapy in a comprehensive inpatient rehab setting. Physiatrist is providing close team supervision and 24 hour management of active medical problems listed below. Physiatrist and rehab team continue to assess barriers to discharge/monitor patient progress toward functional and medical goals  Care Tool:  Bathing    Body parts bathed by  patient: Right arm, Left arm, Chest, Abdomen, Front perineal area, Buttocks, Right upper leg, Left upper leg, Right lower leg, Left lower leg, Face         Bathing assist Assist Level: Supervision/Verbal cueing     Upper Body Dressing/Undressing Upper body dressing   What is the patient wearing?: Pull over shirt    Upper body assist Assist Level: Set up assist    Lower Body Dressing/Undressing Lower body dressing      What is the patient wearing?: Pants, Underwear/pull up     Lower body assist Assist for lower body dressing: Supervision/Verbal cueing     Toileting Toileting    Toileting assist Assist for toileting: Supervision/Verbal cueing     Transfers Chair/bed transfer  Transfers assist     Chair/bed transfer assist level: Independent     Locomotion Ambulation   Ambulation assist      Assist level: Supervision/Verbal cueing Assistive device: No Device Max distance: 400   Walk 10 feet activity   Assist     Assist level: Supervision/Verbal cueing Assistive device: No Device   Walk 50 feet activity   Assist    Assist level: Supervision/Verbal cueing Assistive device: No Device    Walk 150 feet  activity   Assist    Assist level: Supervision/Verbal cueing Assistive device: No Device    Walk 10 feet on uneven surface  activity   Assist     Assist level: Supervision/Verbal cueing Assistive device: Other (comment) (No AD)   Wheelchair     Assist Is the patient using a wheelchair?: No             Wheelchair 50 feet with 2 turns activity    Assist            Wheelchair 150 feet activity     Assist          Blood pressure (!) 128/47, pulse (!) 52, temperature 98.5 F (36.9 C), resp. rate 17, height '5\' 8"'$  (1.727 m), weight 83 kg, SpO2 94 %.  Medical Problem List and Plan: 1. 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 Needs hearing aids in for therapy!  -Continue CIR PT, OT, and SLP   -home today 2.  Antithrombotics: -DVT/anticoagulation:  Pharmaceutical: Lovenox             -antiplatelet therapy: ASA d/c today and to continue Plavix.  3. Pain Management: Oxycodone prn.  4. Mood/Behavior/Sleep: LCSW to follow for evaluation and support.              -antipsychotic agents: Seroquel prn for agitation.              --Per family--gets up at  3 am then sleeps till late am and off/on in daytime.   -will check sleep chart and begin scheduled trazodone for sleep to help better normalize his sleep-wake cycle. May be hard to change however. -9/29 Consider increase trazodone dose if he continues to have poor sleep 5. Neuropsych/cognition: This patient is not fully capable of making decisions on his own behalf. 6. Skin/Wound Care: Routine pressure relief measures.  7. Fluids/Electrolytes/Nutrition: encourage PO  -I personally reviewed the patient's labs today.    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  9. Delirium: Resolving. Continue Seroquel prn as really sedated him --hx confusion with last stroke. 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             --Continue Coreg, Plavix and Crestor.              --add Cardiac restrictions to diet.   -10/4 weights stable Filed Weights   05/06/22 0500 05/07/22 0500 05/08/22 0419  Weight: 84.1 kg 82.5 kg 83 kg    11. CKD III: Monitor with serial checks. Improved with continuous fluids on board.             --d/c fluids to avoid overload. CXR with mild pulmonary edema pre-op.    -10/2 Cr stable at 1.21 12. HTN: Monitor BP TID--on coreg.   -fair control.  Monitor for pattern  9/29 decrease coreg to 6.25 BID  10/4 BP still well controlled, pulse normal to borderline low    05/08/2022    4:19 AM 05/08/2022    4:16 AM  05/07/2022    7:29 PM  Vitals with BMI  Weight 183 lbs    BMI 27.83  Systolic  248 185  Diastolic  47 52  Pulse  52 61     13. Pre-diabetes: Hgb A1C-5.8. Add CM restrictions to diet.              --RD consult for dietary education  -CBGs were discontinued, glucose 106 on BMP-stable  14. HH/GERD: Managed --alternates  Protonix with Pepcid every other day.  15. Cellulitis Right forearm: Secondary to infiltrated IV--started 2-3 days ago per wife. --started warm compresses and doxycycline for treatment.  -10/2 improved 16. Moderate malnutrition  -Dietician following, Glucerna TID started             - wife encouraging POs with outside food.        LOS: 7 days A FACE TO FACE EVALUATION WAS PERFORMED  Jennye Boroughs 05/08/2022, 9:17 AM

## 2022-05-15 ENCOUNTER — Encounter: Payer: Self-pay | Admitting: Occupational Therapy

## 2022-05-15 ENCOUNTER — Other Ambulatory Visit: Payer: Self-pay

## 2022-05-15 ENCOUNTER — Ambulatory Visit: Payer: Medicare Other | Attending: Physical Medicine & Rehabilitation | Admitting: Occupational Therapy

## 2022-05-15 DIAGNOSIS — I6930 Unspecified sequelae of cerebral infarction: Secondary | ICD-10-CM | POA: Diagnosis not present

## 2022-05-15 DIAGNOSIS — R41844 Frontal lobe and executive function deficit: Secondary | ICD-10-CM | POA: Insufficient documentation

## 2022-05-15 DIAGNOSIS — I63512 Cerebral infarction due to unspecified occlusion or stenosis of left middle cerebral artery: Secondary | ICD-10-CM | POA: Diagnosis not present

## 2022-05-15 DIAGNOSIS — R29818 Other symptoms and signs involving the nervous system: Secondary | ICD-10-CM | POA: Diagnosis not present

## 2022-05-15 DIAGNOSIS — E785 Hyperlipidemia, unspecified: Secondary | ICD-10-CM | POA: Diagnosis not present

## 2022-05-15 DIAGNOSIS — R4701 Aphasia: Secondary | ICD-10-CM | POA: Insufficient documentation

## 2022-05-15 DIAGNOSIS — R41841 Cognitive communication deficit: Secondary | ICD-10-CM | POA: Insufficient documentation

## 2022-05-15 DIAGNOSIS — Z09 Encounter for follow-up examination after completed treatment for conditions other than malignant neoplasm: Secondary | ICD-10-CM | POA: Diagnosis not present

## 2022-05-15 DIAGNOSIS — E1169 Type 2 diabetes mellitus with other specified complication: Secondary | ICD-10-CM | POA: Diagnosis not present

## 2022-05-15 NOTE — Therapy (Signed)
OUTPATIENT OCCUPATIONAL THERAPY NEURO EVALUATION  Patient Name: Damon Walker MRN: [id number] DOB:06-09-1946, 76 y.o., male Today's Date: 05/15/2022  PCP: [name], MD  REFERRING PROVIDER: Jennye Boroughs, MD   OT End of Session - 05/15/22 0934     Visit Number 1    Authorization Type BCBS Medicare    OT Start Time 0930    OT Stop Time 4098    OT Time Calculation (min) 45 min    Behavior During Therapy [facility] for tasks assessed/performed;Flat affect   Confused           Past Medical History:  Diagnosis Date   Carotid arterial disease ([place]) 06/2014; 01/09/2015   Followed by Dr. Donnetta Hutching of [facility] -- a) Carotid u/s: 40-59% bilat ICA stenosis; b) 10/2019: Carotid Dopplers October 19, 1189: R ICA 4-78%, LICA 29-56%.  Right vertebral artery appears occluded.  Normal subclavian flow.  Normal left vertebral artery. 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  MRI Brain: Acute L Occipital Lobe Infarct. Chronic microvascular ischemic changes in the white matter and left pons.  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   Chronic combined systolic and diastolic congestive heart failure, NYHA class 2 ([place]) 05/11/2015 - 05/15/15   a. EF 20-25% with anterior-anteroseptal akinesis (immediately post anterior STEMI).  ; b. 10/10/'16 Echo: EF 35-40% with moderate mid-apical anteroseptal, anterior and apical hypokinesis.   Coronary artery disease involving native heart 05/11/2015   Cath: 3V CAD with 90% prox RCA, 80% mid RCA, 80% OM3, (RCA and OM residual treated medically) 100% LAD - PCI to Prox-Mid LAD with Overlapping Promus Premier DES 3.0 x 38 & 3.0 x 16 (post-dilated to 3.5 mm)    Essential hypertension    H/O: GI bleed 05/15/2015   s/p Sigmoid Polypectomy  05/04/2015 - prior to STEMI on May 11, 2015; Had GI Bleed following TPA for CVA, while on ASA & Brilinta; Flex Sig - 1. Internal Hemorrhoids, 2. Distal sigmoid polypectomy site with flat [place] status post biopsy and tattoo with 71m spot over 3 injections 3. Proximal sigmoid polypectomy site seen as well, 4) otherwise normal with no evidence of bleed   Hyperlipidemia with target LDL less than 70    Ischemic cardiomyopathy 05/11/2015   EF 40% on cath 05/11/2015 after anterior STEMI, EF 20-25% on echo 05/13/2015; 12/2015 =  EF ~45%.   Liver disease, chronic, with cirrhosis ([place] 11/2019   Mild possible early cirrhotic liver disease noted on CT scan.   Prediabetes Oct 2016   A1C 6.0 in Oct 2016   ST elevation myocardial infarction (STEMI) involving left anterior descending (LAD) coronary artery with complication ([place] 121/10/863  100% Prox LAD - PCI with overlapping DES x 2   Tobacco abuse    a. 30 yrs - 1.5 ppd. - Quit 05/11/2015   Type 2 diabetes mellitus with other specified complication ([place] 37/84/6962  Vision abnormalities    Right hemianopsia   Past Surgical History:  Procedure Laterality Date   CARDIAC CATHETERIZATION N/A 05/11/2015   Procedure: Left Heart Cath and Coronary Angiography;  Surgeon: Peter M JMartinique MD;  [facility];  Service: Cardiovascular;  100% pLAD (long lesion). RCA - prox 90%, mid 80%. OM2 & OM3 80% (OM2 & 3 not necessarily PCI  targets); EF 35-45% with Anterio HK   CARDIAC CATHETERIZATION N/A 05/11/2015   Procedure: Coronary Stent Intervention;  Surgeon: Peter M Martinique, MD;  Location: [place] [facility];  Service: Cardiovascular; PCI to Prox-Mid LAD with Overlapping Promus Premier DES 3.0 x 38 & 3.0 x 16 (post-dilated to 3.5 mm)     FLEXIBLE SIGMOIDOSCOPY N/A 05/18/2015   Procedure: FLEXIBLE SIGMOIDOSCOPY;  Surgeon: Clarene Essex, MD;  Location: [place];  Service: Endoscopy;  Laterality: N/A;   NM MYOVIEW LTD  08/2016   EF 30% with large  anterior-anteroseptal and apical infarct consistent with LAD infarct. His HIGH RISK because of large infarct. No ischemia.   S/P Appendectomy     Age 1   S/P Inguinal Hernia Repair     In his 20's.   TRANSCAROTID ARTERY REVASCULARIZATION  Left 04/30/2022   Procedure: Transcarotid Artery Revascularization;  Surgeon: Angelia Mould, MD;  Location: [facility] OR;  Service: Vascular;  Laterality: Left;   TRANSTHORACIC ECHOCARDIOGRAM  05/15/2015   Mild LVH, EF 35-40% - Mod HK of mid-apical anteroseptal, anterior & apical walls.  Gr 1 DD. Mild bilateral Atrial Enlargement.   TRANSTHORACIC ECHOCARDIOGRAM  12/2015   EF up to ~40-45% wiht Anterior-Anteroapical HK.  Mod LV dilation with increased LVEDP.   TRANSTHORACIC ECHOCARDIOGRAM  12/'17; 4/'18   a) EF ~30-35% (in setting of HTN Urgency). - Gr 2 DD;; b) EF 30-35% (per Dr. Sallyanne Kuster - 35-40%). Anterior-anteroapical Akinesis. Gr2 DD w/ high filling pressures. Mild LA dilation. Mild RV dilation.   ULTRASOUND GUIDANCE FOR VASCULAR ACCESS Right 04/30/2022   Procedure: ULTRASOUND GUIDANCE FOR VASCULAR ACCESS;  Surgeon: Angelia Mould, MD;  Location: [place];  Service: Vascular;  Laterality: Right;   Patient Active Problem List   Diagnosis Date Noted   Acute blood loss anemia 05/08/2022   Cellulitis--right forearm 05/08/2022   Malnutrition of moderate degree 05/04/2022   Acute ischemic left middle cerebral artery (MCA) stroke ([place]) 05/01/2022   Hypokalemia 04/25/2022   Acute respiratory failure with hypoxia and hypercapnia (HCC)    Acute CVA (cerebrovascular accident) ([place]) 04/21/2022   Combined systolic and diastolic heart failure ([place]) 04/21/2022   Abnormal bowel movement 10/23/2021   Hearing loss 10/23/2021   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   Personal history of colonic polyps 10/23/2021   Personal history of other diseases of the digestive system 10/23/2021   Psoriasis 10/23/2021   Type 2 diabetes mellitus  with other specified complication ([place]) 24/40/1027   Abnormal abdominal CT scan 12/26/2019   Claudication ([place]) 10/24/2019   Trochanteric bursitis of right hip 12/22/2018   CKD (chronic kidney disease), stage III ([place]) 07/08/2016   Acute respiratory failure with hypoxia ([place]) 07/08/2016   GERD (gastroesophageal reflux disease) 07/08/2016   Coronary artery disease involving native coronary artery of native heart with angina pectoris ([place]) 07/05/2015   Depression 07/05/2015   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 ([place]) 05/18/2015   Right homonymous hemianopsia 05/18/2015   Gait disturbance, post-stroke 05/18/2015   ST elevation myocardial infarction (STEMI) of anterior wall, subsequent episode of care ([place]) 05/15/2015   Lower GI bleed 05/15/2015   Carotid arterial disease ([place]) 05/11/2015   Cardiomyopathy, ischemic: EF most recently 35-40%) 05/11/2015   CAD-native artery 05/11/2015    Class: Status post   Presence of drug coated stent in LAD coronary artery 05/08/2015   Prediabetes 05/06/2015   Essential hypertension 07/05/2014   Hyperlipidemia with target LDL less than 70 07/05/2014   Former heavy cigarette smoker (  20-39 per day) 07/05/2014    ONSET DATE: 04/21/22  REFERRING DIAG: A63.016 (ICD-10-CM) - Cerebral infarction due to unspecified occlusion or stenosis of left middle cerebral artery  THERAPY DIAG:  Cerebral infarction due to unspecified occlusion or stenosis of left middle cerebral artery (HCC)  Frontal lobe and executive function deficit  Other symptoms and signs involving the nervous system  Rationale for Evaluation and Treatment Rehabilitation  SUBJECTIVE:   SUBJECTIVE STATEMENT: Pt arrives to OP OT w/ primary concern related to slurred speech and word finding difficulty. Pt states"I had a stroke;" "It feels like I blanked out for 2 weeks and then I came back." Pt is currently participating well in functional tasks at home and does not feel  he needs PT evaluation. Pt's wife also does report that noted R homonymous hemianopsia was present after prior stroke 7 years ago. Pt accompanied by: self and wife Mateo Flow)  PERTINENT HISTORY: Acute L MCA CVA, admitted 04/21/22 due to confusion and speech difficulty;  left transcarotid artery revascularization (TCAR) w/ surgical balloon and stent placement on 04/30/22  CT perfusion showed abnormal perfusion involving right temporal and left parietal lobe with extensive bilateral atherosclerotic disease, 50% left ICA stenosis and diminutive R-VA. MRI brain done revealing acute infarct in left parietal lobe with arachnoid cyst right middle cranial fossa dissecting lateral into temporal lobe.  2D echo showed EF 30-35% with global hypokinesis and akinesis of left ventricular, apical and anterior wall. Dr. Erlinda Hong felt that L-MCA infarct likely from [place] stenosis/plaque and recommended 30 day CardioNet monitoring tor rule out A Fib on outpatient basis.  Additional history of chronic combined CHF, L PCA CVA with R hemianopsia (2016), STEMI (2016), CKD III, HTN, HH/GERD, CAD.   PRECAUTIONS: Other: no driving; no lifting more than 10 lbs (TCAR restrictions post-surgery 04/30/22)  WEIGHT BEARING RESTRICTIONS No  PAIN: Are you having pain? No  FALLS: Has patient fallen in last 6 months? No  LIVING ENVIRONMENT: Lives with: lives with their spouse Lives in: House/apartment Stairs: No Has following equipment at home: Grab bars  PLOF: Independent, Vocation/Vocational requirements: retired, and Leisure: plays games on the computer  PATIENT GOALS: "speech"   OBJECTIVE:   HAND DOMINANCE: Right  ADLs: BADLs: pt and spouse report pt is completing dressing, bathing, grooming, and toileting tasks w/ at least Mod Ind since d/c from hospital Shopping: wife completes due to pt w/out medical clearance to drive Housekeeping: wife completes and was completing prior; able to complete light tasks (e.g., making the  bed) Meal Prep: able to complete simple meal prep (e.g., sandwich) Community mobility: no driving until medically cleared Medication management: wife completes; pt and his wife report she also largely managed pt's medications prior to onset Financial management: pt was managing finances prior; wife has been managing finances since onset Handwriting:  no difficulty; signature legible  MOBILITY STATUS: Independent; required extended time to ambulate in/out of session, but did not require AD or appear unsteady; no report of falls  FUNCTIONAL OUTCOME MEASURES: FOTO: 82 (slight functional impairment)  UPPER EXTREMITY ROM    BUE WFL  UPPER EXTREMITY MMT:    Not assessed due to medical/post-op restrictions  HAND FUNCTION: Not assessed due to medical/post-op restrictions  COORDINATION: 9 Hole Peg test: Right: 55 sec; Left: 47 sec  SENSATION: Not tested; denies numbness, tingling/pins and needles  COGNITION: Overall cognitive status: Impaired; difficult to assess due to communication impairment/aphasia  VISION: Subjective report: "I can see just fine" Baseline vision: Wears glasses for reading only  Visual history: cataracts and R homonymous hemianopsia s/p L PCA CVA in 2016  VISION ASSESSMENT: Tracking/Visual pursuits: Able to track stimulus in all quads without difficulty Saccades: decreased speed of saccadic movements Convergence: Impaired: improved w/ repetition; difficult to assess due to aphasia Visual Fields: Right homonymous hemianopsia (premorbid s/p CVA in 2016) Symbol Cancellation: 21/24 (87.5%); extended time for processing/understanding of task and did not demonstrate organized search pattern   TODAY'S TREATMENT:  None - evaluation only   PATIENT EDUCATION: Educated on role and purpose of OT as well as potential interventions and goals for therapy based on initial evaluation findings. OT also provided education on benefit of follow-up w/ ophthalmology and/or  neuro-ophthalmology to further address visual deficits; provided handout outlining compensatory and safety strategies related to visual field deficit w/ pt's wife verbalizing understanding of strategies Person educated: Patient and Spouse Education method: Explanation and Handouts Education comprehension: verbalized understanding   HOME EXERCISE PROGRAM: None   GOALS: Goals reviewed with patient? Yes  SHORT TERM GOALS:     Status:  1 Pt will verbalize understanding of role/purpose of OT services and be able to independently identify if/when he may need to seek an add'l referral for services to assist w/ participation in functional activities   Met - 05/15/22  2 Pt and/or caregiver will verbalize understanding of recommended follow-up and safety considerations/compensatory strategies related to visual deficits  Met - 05/15/22    ASSESSMENT:  CLINICAL IMPRESSION: Pt is a 76 y/o who presents to OP OT 2/2 acute L MCA CVA two and a half weeks ago. PMH includes chronic combined CHF, L PCA CVA w/ R hemianopsia (2016), STEMI (2016), CKD III, HTN, HH/GERD, CAD. Pt currently lives with his wife and was independent prior to onset. Evaluation indicated cognitive and speech limitations, as well as premorbid R visual field deficit. Due to reported absence of functional deficits unrelated to aforementioned concerns, and upcoming appointment scheduled to start SLP services, skilled occupational therapy services are not warranted at this time. OT discussed this w/ pt and his wife, who were receptive. Pt encouraged to call back with any specific changes or development of limitations during functional activities, as well as to continue follow-up with physician and ophthalmology, as appropriate.?   PERFORMANCE DEFICITS in functional skills including cardiopulmonary status limiting function and vision, and cognitive skills including attention, memory, problem solving, and safety awareness.   IMPAIRMENTS are  limiting patient from IADLs and leisure.   COMORBIDITIES has co-morbidities such as premorbid R homonymous hemianopsia, h/o STEMI, and chronic combined CHF  that affect occupational performance. Patient will benefit from skilled OT to address above impairments and improve overall function.  MODIFICATION OR ASSISTANCE TO COMPLETE EVALUATION: Min-Moderate modification of tasks or assist with assess necessary to complete an evaluation.  OT OCCUPATIONAL PROFILE AND HISTORY: Problem focused assessment: Including review of records relating to presenting problem.  CLINICAL DECISION MAKING: LOW - limited treatment options, no task modification necessary  REHAB POTENTIAL: N/A  EVALUATION COMPLEXITY: Moderate   PLAN: OT FREQUENCY: one time visit  PLANNED INTERVENTIONS: patient/family education and visual/perceptual remediation/compensation  RECOMMENDED OTHER SERVICES: SLP services, follow-up w/ ophthalmology and/or neuro-ophthalmology  CONSULTED AND AGREED WITH PLAN OF CARE: Patient and family member/caregiver  PLAN FOR NEXT SESSION: Skilled OT services are not warranted at this time; pt encouraged to call back with any changes in functional status or limitations.   Kathrine Cords, MSOT, OTR/L 05/15/2022, 11:40 AM

## 2022-05-23 ENCOUNTER — Other Ambulatory Visit: Payer: Self-pay | Admitting: *Deleted

## 2022-05-23 DIAGNOSIS — I6523 Occlusion and stenosis of bilateral carotid arteries: Secondary | ICD-10-CM

## 2022-05-27 ENCOUNTER — Ambulatory Visit: Payer: Medicare Other | Admitting: Adult Health

## 2022-05-27 ENCOUNTER — Other Ambulatory Visit: Payer: Self-pay | Admitting: Adult Health

## 2022-05-27 ENCOUNTER — Encounter: Payer: Self-pay | Admitting: Adult Health

## 2022-05-27 VITALS — BP 123/61 | HR 57 | Ht 67.0 in | Wt 181.2 lb

## 2022-05-27 DIAGNOSIS — Z0289 Encounter for other administrative examinations: Secondary | ICD-10-CM

## 2022-05-27 DIAGNOSIS — I63532 Cerebral infarction due to unspecified occlusion or stenosis of left posterior cerebral artery: Secondary | ICD-10-CM | POA: Diagnosis not present

## 2022-05-27 DIAGNOSIS — Z8673 Personal history of transient ischemic attack (TIA), and cerebral infarction without residual deficits: Secondary | ICD-10-CM

## 2022-05-27 NOTE — Progress Notes (Signed)
PATIENT: Damon Walker DOB: 10/20/1945  REASON FOR VISIT: follow up HISTORY FROM: patient PRIMARY NEUROLOGIST: Dr. Leonie Man  Chief Complaint  Patient presents with   Follow-up    Pt in 19  wife Pt here stroke f/u  Pt states his speech is delayed Pt states he is taking Speech therapy      HISTORY OF PRESENT ILLNESS: Today 05/27/22  Damon Walker is a 76 y.o. male who has been followed in this office for L MCA infarct. Returns today for follow-up.  He is here today with his wife.  Overall he feels that he is back to his baseline with the exception of his speech. Still notice trouble with speech. Hard time pronouncing words. Sometimes has trouble getting his words out. WIfe reports that he has improved greatly since the hospital.  He is currently on aspirin and Plavix.  Had vascular surgery with Dr. Glory Rosebush and will have follow-up this week.  Has not wore cardiac monitor yet.  HISTORY Damon Walker is a 76 y.o. 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 Initial NIH 8. Patient had a rapid response on 9/18 and was emergently transferred to ICU and placed on bipap with improvement of respiratory status.  Patient awake today, much more cooperative, able to answer questions with encouragement. Speech is more spontaneous, intermittently asks " what do you want me to do?".    Stroke:  Acute ischemic L MCA infarct, etiology likely from left ICA stenosis/plaque  Code Stroke CT head No acute abnormality.  CTA head & neck 50% diameter stenosis proximal left internal carotid artery. Diminutive right vertebral artery which appears diffusely diseased and has minimal contribution to the basilar. MRI  Acute infarct left parietal lobe without hemorrhage 2D Echo LVEF 30-35%   CUS left ICA 40-59% stenosis Will need 30-day CardioNet monitoring to rule out A-fib as outpatient LDL 69 HgbA1c 5.8 VTE prophylaxis - SCDs clopidogrel 75 mg daily prior to admission, now on Aspirin 81 mg and clopidogrel 75 mg  REVIEW OF SYSTEMS: Out of a complete 14 system review of symptoms, the patient complains only of the following symptoms, and all other reviewed systems are negative.  ALLERGIES: Allergies  Allergen Reactions   Latex Hives, Itching, Rash and Other (See Comments)    Blisters    HOME MEDICATIONS: Outpatient Medications Prior to Visit  Medication Sig Dispense Refill   acetaminophen (TYLENOL) 325 MG tablet Take 1-2 tablets (325-650 mg total) by mouth every 4 (four) hours as needed for mild pain.     aspirin EC 81 MG tablet Take 1 tablet (81 mg total) by mouth daily. 30 tablet 12   carvedilol (COREG) 6.25 MG tablet Take 1 tablet (6.25 mg total) by mouth 2 (two) times daily with a meal. 60 tablet 0   clopidogrel (PLAVIX) 75 MG tablet Take 1 tablet (75 mg total) by mouth daily. 30 tablet 0   famotidine (PEPCID) 40 MG tablet Take 1 tablet (40 mg total) by mouth every other day. 15 tablet 0   ferrous sulfate 325 (65 FE) MG tablet Take 1 tablet (325 mg total) by mouth daily with breakfast. 30 tablet 0   nitroGLYCERIN (NITROSTAT) 0.4 MG SL tablet Place 1 tablet (0.4 mg total) under the tongue every 5 (five) minutes x 3 doses as needed for chest pain.  25 tablet 3   pantoprazole (PROTONIX) 20 MG tablet Take 1 tablet (20 mg total) by mouth every other day. 15 tablet 0   rosuvastatin (CRESTOR) 40 MG tablet Take 1 tablet (40 mg total) by mouth daily at 6 PM. 30 tablet 0   sertraline (ZOLOFT) 25 MG tablet Take 0.5 tablets (12.5 mg total) by mouth at bedtime. 15 tablet 0   traZODone (DESYREL) 50 MG tablet Take 1 tablet (50 mg total) by mouth at bedtime. 30 tablet 0   docusate sodium (COLACE) 100 MG capsule Take 1 capsule (100 mg total) by mouth daily. 30 capsule 0   doxycycline  (VIBRA-TABS) 100 MG tablet Take 1 tablet (100 mg total) by mouth every 12 (twelve) hours. 2 tablet 0   No facility-administered medications prior to visit.    PAST MEDICAL HISTORY: Past Medical History:  Diagnosis Date   Carotid arterial disease ([place]) 06/2014; 01/09/2015   Followed by Dr. Donnetta Hutching of [facility] -- a) Carotid u/s: 40-59% bilat ICA stenosis; b) 10/2019: Carotid Dopplers October 20, 4006: R ICA 6-76%, LICA 19-50%.  Right vertebral artery appears occluded.  Normal subclavian flow.  Normal left vertebral artery. -->  No change since 2020   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 ([place]) 05/14/2015   -- Given TPA.  MRI Brain: Acute L Occipital Lobe Infarct. Chronic microvascular ischemic changes in the white matter and left pons.  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   Chronic combined systolic and diastolic congestive heart failure, NYHA class 2 ([place]) 05/11/2015 - 05/15/15   a. EF 20-25% with anterior-anteroseptal akinesis (immediately post anterior STEMI).  ; b. 10/10/'16 Echo: EF 35-40% with moderate mid-apical anteroseptal, anterior and apical hypokinesis.   Coronary artery disease involving native heart 05/11/2015   Cath: 3V CAD with 90% prox RCA, 80% mid RCA, 80% OM3, (RCA and OM residual treated medically) 100% LAD - PCI to Prox-Mid LAD with Overlapping Promus Premier DES 3.0 x 38 & 3.0 x 16 (post-dilated to 3.5 mm)    Essential hypertension    H/O: GI bleed 05/15/2015   s/p Sigmoid Polypectomy 05/04/2015 - prior to STEMI on May 11, 2015; Had GI Bleed following TPA for CVA, while on ASA & Brilinta; Flex Sig - 1. Internal Hemorrhoids, 2. Distal sigmoid polypectomy site with flat [place] status post biopsy and tattoo with 73m spot over 3 injections 3. Proximal sigmoid polypectomy site seen as well, 4) otherwise  normal with no evidence of bleed   Hyperlipidemia with target LDL less than 70    Ischemic cardiomyopathy 05/11/2015   EF 40% on cath 05/11/2015 after anterior STEMI, EF 20-25% on echo 05/13/2015; 12/2015 =  EF ~45%.   Liver disease, chronic, with cirrhosis ([facility] 11/2019   Mild possible early cirrhotic liver disease noted on CT scan.   Prediabetes Oct 2016   A1C 6.0 in Oct 2016   ST elevation myocardial infarction (STEMI) involving left anterior descending (LAD) coronary artery with complication ([facility] 193/09/6710  100% Prox LAD - PCI with overlapping DES x 2   Tobacco abuse    a. 30 yrs - 1.5 ppd. - Quit 05/11/2015   Type 2 diabetes mellitus with other specified complication ([facility] 34/58/0998  Vision abnormalities    Right hemianopsia    PAST SURGICAL HISTORY: Past Surgical History:  Procedure Laterality Date   CARDIAC CATHETERIZATION  N/A 05/11/2015   Procedure: Left Heart Cath and Coronary Angiography;  Surgeon: Peter M Martinique, MD;  Location: [facility];  Service: Cardiovascular;  100% pLAD (long lesion). RCA - prox 90%, mid 80%. OM2 & OM3 80% (OM2 & 3 not necessarily PCI targets); EF 35-45% with Anterio HK   CARDIAC CATHETERIZATION N/A 05/11/2015   Procedure: Coronary Stent Intervention;  Surgeon: Peter M Martinique, MD;  Location: [facility];  Service: Cardiovascular; PCI to Prox-Mid LAD with Overlapping Promus Premier DES 3.0 x 38 & 3.0 x 16 (post-dilated to 3.5 mm)     FLEXIBLE SIGMOIDOSCOPY N/A 05/18/2015   Procedure: FLEXIBLE SIGMOIDOSCOPY;  Surgeon: Clarene Essex, MD;  Location: [facility];  Service: Endoscopy;  Laterality: N/A;   NM MYOVIEW LTD  08/2016   EF 30% with large anterior-anteroseptal and apical infarct consistent with LAD infarct. His HIGH RISK because of large infarct. No ischemia.   S/P Appendectomy     Age 15   S/P Inguinal Hernia Repair     In his 20's.   TRANSCAROTID ARTERY REVASCULARIZATION  Left 04/30/2022   Procedure: Transcarotid Artery  Revascularization;  Surgeon: Angelia Mould, MD;  Location: [facility] OR;  Service: Vascular;  Laterality: Left;   TRANSTHORACIC ECHOCARDIOGRAM  05/15/2015   Mild LVH, EF 35-40% - Mod HK of mid-apical anteroseptal, anterior & apical walls.  Gr 1 DD. Mild bilateral Atrial Enlargement.   TRANSTHORACIC ECHOCARDIOGRAM  12/2015   EF up to ~40-45% wiht Anterior-Anteroapical HK.  Mod LV dilation with increased LVEDP.   TRANSTHORACIC ECHOCARDIOGRAM  12/'17; 4/'18   a) EF ~30-35% (in setting of HTN Urgency). - Gr 2 DD;; b) EF 30-35% (per Dr. Sallyanne Kuster - 35-40%). Anterior-anteroapical Akinesis. Gr2 DD w/ high filling pressures. Mild LA dilation. Mild RV dilation.   ULTRASOUND GUIDANCE FOR VASCULAR ACCESS Right 04/30/2022   Procedure: ULTRASOUND GUIDANCE FOR VASCULAR ACCESS;  Surgeon: Angelia Mould, MD;  Location: [facility] OR;  Service: Vascular;  Laterality: Right;    FAMILY HISTORY: Family History  Adopted: Yes  Problem Relation Age of Onset   Other Mother        eczema    Cancer Mother    Other Other        Adopted - unaware of biological parent's histories.   Stroke Neg Hx     SOCIAL HISTORY: Social History   Socioeconomic History   Marital status: Married    Spouse name: Not on file   Number of children: 3   Years of education: Not on file   Highest education level: Not on file  Occupational History   Occupation: retired from [employer]   Smoking status: Former    Packs/day: 1.00    Years: 30.00    Total pack years: 30.00    Types: Cigarettes    Quit date: 04/06/2015    Years since quitting: 7.1    Passive exposure: Current (Wife is a smoker)   Smokeless tobacco: Never   Tobacco comments:    Quit the day of his STEMI  Substance and Sexual Activity   Alcohol use: No    Alcohol/week: 0.0 standard drinks of alcohol   Drug use: No   Sexual activity: Not on file  Other Topics Concern   Not on file  Social History Narrative   Lives in [place] with wife.  Originally from [place].   Social Determinants of Health   Financial Resource Strain: Not on file  Food Insecurity: Not on file  Transportation Needs: Not on file  Physical Activity: Not on file  Stress: Not on file  Social Connections: Not on file  Intimate Partner Violence: Not on file      PHYSICAL EXAM  Vitals:   05/27/22 0914  BP: 123/61  Pulse: (!) 57  Weight: 181 lb 3.2 oz (82.2 kg)  Height: '5\' 7"'$  (1.702 m)   Body mass index is 28.38 kg/m.  Generalized: Well developed, in no acute distress   Neurological examination  Mentation: Alert oriented to time, place, history taking. Follows all commands speech and language fluent.  Some trouble with processing information Cranial nerve II-XII: Pupils were equal round reactive to light. Extraocular movements were full, visual field were full on confrontational test. Facial sensation and strength were normal. Uvula tongue midline. Head turning and shoulder shrug  were normal and symmetric. Motor: The motor testing reveals 5 over 5 strength of all 4 extremities. Good symmetric motor tone is noted throughout.  Sensory: Sensory testing is intact to soft touch on all 4 extremities. No evidence of extinction is noted.  Coordination: Cerebellar testing reveals good finger-nose-finger and heel-to-shin bilaterally.  Gait and station: Gait is normal.  Reflexes: Deep tendon reflexes are symmetric and normal bilaterally.   DIAGNOSTIC DATA (LABS, IMAGING, TESTING) - I reviewed patient records, labs, notes, testing and imaging myself where available.  Lab Results  Component Value Date   WBC 8.6 05/06/2022   HGB 12.4 (L) 05/06/2022   HCT 37.0 (L) 05/06/2022   MCV 91.6 05/06/2022   PLT 272 05/06/2022      Component Value Date/Time   NA 140 05/06/2022 0659   NA 146 (H) 11/24/2019 1830   K 4.0 05/06/2022 0659   CL 109 05/06/2022 0659   CO2 25 05/06/2022 0659   GLUCOSE 106 (H) 05/06/2022 0659   BUN 15 05/06/2022 0659   BUN 24  11/24/2019 1830   CREATININE 1.21 05/06/2022 0659   CREATININE 1.51 (H) 09/24/2016 1554   CALCIUM 8.7 (L) 05/06/2022 0659   PROT 5.9 (L) 05/02/2022 0649   ALBUMIN 2.6 (L) 05/02/2022 0649   AST 23 05/02/2022 0649   ALT 17 05/02/2022 0649   ALKPHOS 34 (L) 05/02/2022 0649   BILITOT 0.6 05/02/2022 0649   GFRNONAA >60 05/06/2022 0659   GFRAA 69 11/24/2019 1830   Lab Results  Component Value Date   CHOL 86 05/01/2022   HDL 22 (L) 05/01/2022   LDLCALC 43 05/01/2022   TRIG 104 05/01/2022   CHOLHDL 3.9 05/01/2022   Lab Results  Component Value Date   HGBA1C 5.8 (H) 04/22/2022   No results found for: "VITAMINB12" Lab Results  Component Value Date   TSH 1.006 05/11/2015      ASSESSMENT AND PLAN 76 y.o. year old male  has a past medical history of Carotid arterial disease ([place]) (06/2014; 01/09/2015), 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 ([place]) (05/14/2015), Cholelithiasis with obstruction (11/2019), Chronic combined systolic and diastolic congestive heart failure, NYHA class 2 ([place]) (05/11/2015 - 05/15/15), Coronary artery disease involving native heart (05/11/2015), Essential hypertension, H/O: GI bleed (05/15/2015), Hyperlipidemia with target LDL less than 70, Ischemic cardiomyopathy (05/11/2015), Liver disease, chronic, with cirrhosis ([place]) (11/2019), Prediabetes (Oct 2016), ST elevation myocardial infarction (STEMI) involving left anterior descending (LAD) coronary artery with complication ([place]) (47/11/2593), Tobacco abuse, Type 2 diabetes mellitus with other specified complication ([place]) (6/38/7564), and Vision abnormalities. here with:  Stroke:  Acute ischemic L MCA infarct, etiology likely from left ICA stenosis/plaque   Continue aspirin 81  mg daily  and Plavix 75 mg daily for secondary stroke prevention.  Discussed secondary stroke prevention measures and importance of close PCP follow up for aggressive stroke risk factor management. I have gone over the  pathophysiology of stroke, warning signs and symptoms, risk factors and their management in some detail with instructions to go to the closest emergency room for symptoms of concern. HTN: BP goal <130/90.   HLD: LDL goal <70. Recent LDL 69.  DMII: A1c goal<7.0. Recent A1c 5.8.  Encouraged patient to monitor diet and encouraged exercise FU with our office 6 months or sooner if needed     Ward Givens, MSN, NP-C 05/27/2022, 9:41 AM [facility] [address], [place], [place] [address] [phone]

## 2022-05-27 NOTE — Progress Notes (Unsigned)
Cardiology Office Note:    Date:  05/29/2022   ID:  Damon Walker, DOB 11/12/1945, MRN [id number]  PCP:  Damon Walker, [place] Cardiologist: Damon Hew, MD   Reason for visit: Hospital follow-up  History of Present Illness:    Damon Walker is a 76 y.o. 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  He last saw Damon Walker in January 2023.  Patient does not exercise much.  He does have some pain down his legs when he walks.  ABIs ordered and indicated mild RLE arterial disease and normal L ABI.  Patient was admitted to the hospital April 21, 2022 with onset of confusion and speech difficulty.  Patient found to have left parietal stroke likely from left ICA stenosis/plaque and recommended 30-day CardioNet monitoring to rule out A-fib.  Dr. Doren Walker was consulted for input and patient underwent TCAR on 09/26.  Soft blood pressures, Coreg was decreased to 6.25 mg twice daily. He continued be limited by cognitive deficits with weakness and CIR was recommended due to functional decline.    Today, patient comes in with his wife.  They state he is recovering well post stroke.  He has no physical deficits.  He does have some trouble with memory.  He did not require any PT or OT postdischarge.  He is still attending outpatient speech therapy.  He saw neurology yesterday who noted some trouble with speech -pronouncing words and getting the words out.  He follows up with neurology in 6 months.  His stroke risk factors are well controlled.  He was only on Plavix 75 mg prior to his stroke.  He is now on aspirin 81 and Plavix.  Patient had GI bleeding in 2016 following TPA for CVA, while on ASA & Brilinta.  His wife brings his blood pressure log and.  Blood pressures 100s  to 120s/50s to 60s -this is on reduced dose of Coreg 6.25 mg twice daily (prior to hospitalization he was on 12.5 mg twice daily).  Lasix (previously 20 mg as needed for weight gain) and losartan (previously 25 mg for systolic blood pressure over 120) were taking off his med list.  Patient denies palpitations, shortness of breath, chest pain, PND, orthopnea, LE edema.  No previously diagnosed arrhythmia.  Neurology recommended 30-day heart monitor.      Past Medical History:  Diagnosis Date   Carotid arterial disease ([place]) 06/2014; 01/09/2015   Followed by Dr. Donnetta Hutching of [facility] -- a) Carotid u/s: 40-59% bilat ICA stenosis; b) 10/2019: Carotid Dopplers October 19, 2669: R ICA 2-45%, LICA 80-99%.  Right vertebral artery appears occluded.  Normal subclavian flow.  Normal left vertebral artery. 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  MRI Brain: Acute L Occipital Lobe Infarct. Chronic microvascular ischemic changes in the white matter and left pons.  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   Chronic combined systolic and diastolic congestive heart failure, NYHA class 2 ([place]) 05/11/2015 - 05/15/15   a. EF 20-25% with anterior-anteroseptal akinesis (immediately post anterior STEMI).  ; b. 10/10/'16 Echo: EF 35-40% with moderate mid-apical  anteroseptal, anterior and apical hypokinesis.   Coronary artery disease involving native heart 05/11/2015   Cath: 3V CAD with 90% prox RCA, 80% mid RCA, 80% OM3, (RCA and OM residual treated medically) 100% LAD - PCI to Prox-Mid LAD with Overlapping Promus Premier DES 3.0 x 38 & 3.0 x 16 (post-dilated to 3.5 mm)    Essential hypertension    H/O: GI bleed 05/15/2015   s/p Sigmoid Polypectomy 05/04/2015 - prior to STEMI on May 11, 2015;  Had GI Bleed following TPA for CVA, while on ASA & Brilinta; Flex Sig - 1. Internal Hemorrhoids, 2. Distal sigmoid polypectomy site with flat [place] status post biopsy and tattoo with 30m spot over 3 injections 3. Proximal sigmoid polypectomy site seen as well, 4) otherwise normal with no evidence of bleed   Hyperlipidemia with target LDL less than 70    Ischemic cardiomyopathy 05/11/2015   EF 40% on cath 05/11/2015 after anterior STEMI, EF 20-25% on echo 05/13/2015; 12/2015 =  EF ~45%.   Liver disease, chronic, with cirrhosis ([facility] 11/2019   Mild possible early cirrhotic liver disease noted on CT scan.   Prediabetes Oct 2016   A1C 6.0 in Oct 2016   ST elevation myocardial infarction (STEMI) involving left anterior descending (LAD) coronary artery with complication ([facility] 176/02/3418  100% Prox LAD - PCI with overlapping DES x 2   Tobacco abuse    a. 30 yrs - 1.5 ppd. - Quit 05/11/2015   Type 2 diabetes mellitus with other specified complication ([facility] 33/79/0240  Vision abnormalities    Right hemianopsia    Past Surgical History:  Procedure Laterality Date   CARDIAC CATHETERIZATION N/A 05/11/2015   Procedure: Left Heart Cath and Coronary Angiography;  Surgeon: Peter M JMartinique MD;  [facility];  Service: Cardiovascular;  100% pLAD (long lesion). RCA - prox 90%, mid 80%. OM2 & OM3 80% (OM2 & 3 not necessarily PCI targets); EF 35-45% with Anterio HK   CARDIAC CATHETERIZATION N/A 05/11/2015   Procedure: Coronary Stent Intervention;  Surgeon: Peter M JMartinique MD;  [facility];  Service: Cardiovascular; PCI to Prox-Mid LAD with Overlapping Promus Premier DES 3.0 x 38 & 3.0 x 16 (post-dilated to 3.5 mm)     FLEXIBLE SIGMOIDOSCOPY N/A 05/18/2015   Procedure: FLEXIBLE SIGMOIDOSCOPY;  Surgeon: MClarene Essex MD;  [facility]  Service: Endoscopy;  Laterality: N/A;   NM MYOVIEW LTD  08/2016   EF 30% with large anterior-anteroseptal and apical infarct consistent with  LAD infarct. His HIGH RISK because of large infarct. No ischemia.   S/P Appendectomy     Age 76  S/P Inguinal Hernia Repair     In his 20's.   TRANSCAROTID ARTERY REVASCULARIZATION  Left 04/30/2022   Procedure: Transcarotid Artery Revascularization;  Surgeon: DAngelia Mould MD;  [facility];  Service: Vascular;  Laterality: Left;   TRANSTHORACIC ECHOCARDIOGRAM  05/15/2015   Mild LVH, EF 35-40% - Mod HK of mid-apical anteroseptal, anterior & apical walls.  Gr 1 DD. Mild bilateral Atrial Enlargement.   TRANSTHORACIC ECHOCARDIOGRAM  12/2015   EF up to ~40-45% wiht Anterior-Anteroapical HK.  Mod LV dilation with increased LVEDP.   TRANSTHORACIC ECHOCARDIOGRAM  12/'17; 4/'18   a) EF ~30-35% (in setting of HTN Urgency). - Gr 2 DD;; b) EF 30-35% (per Dr. CSallyanne Kuster- 35-40%). Anterior-anteroapical Akinesis. Gr2 DD w/ high filling pressures. Mild LA dilation. Mild RV dilation.   ULTRASOUND GUIDANCE FOR VASCULAR ACCESS  Right 04/30/2022   Procedure: ULTRASOUND GUIDANCE FOR VASCULAR ACCESS;  Surgeon: Angelia Mould, MD;  [facility];  Service: Vascular;  Laterality: Right;    Current Medications: Current Meds  Medication Sig   acetaminophen (TYLENOL) 325 MG tablet Take 1-2 tablets (325-650 mg total) by mouth every 4 (four) hours as needed for mild pain.   aspirin EC 81 MG tablet Take 1 tablet (81 mg total) by mouth daily.   carvedilol (COREG) 6.25 MG tablet Take 1 tablet (6.25 mg total) by mouth 2 (two) times daily with a meal.   clopidogrel (PLAVIX) 75 MG tablet Take 1 tablet (75 mg total) by mouth daily.   famotidine (PEPCID) 40 MG tablet Take 1 tablet (40 mg total) by mouth every other day.   ferrous sulfate 325 (65 FE) MG tablet Take 1 tablet (325 mg total) by mouth daily with breakfast.   furosemide (LASIX) 20 MG tablet Take 20 mg by mouth as needed for fluid or edema (weight gain of 3 lbs in a day and 5 lbs in a week). Take 1 tablet as needed for weight gain of 3 lbs in a  day and 5 lbs in a week or swelling   losartan (COZAAR) 25 MG tablet Take 25 mg by mouth as needed (SBP > 160). Take 1 tablet as needed for systolic bp greater than 182   nitroGLYCERIN (NITROSTAT) 0.4 MG SL tablet Place 1 tablet (0.4 mg total) under the tongue every 5 (five) minutes x 3 doses as needed for chest pain.   pantoprazole (PROTONIX) 20 MG tablet Take 1 tablet (20 mg total) by mouth every other day.   rosuvastatin (CRESTOR) 40 MG tablet Take 1 tablet (40 mg total) by mouth daily at 6 PM.   sertraline (ZOLOFT) 25 MG tablet Take 0.5 tablets (12.5 mg total) by mouth at bedtime.   traZODone (DESYREL) 50 MG tablet Take 1 tablet (50 mg total) by mouth at bedtime.   [DISCONTINUED] docusate sodium (COLACE) 100 MG capsule Take 1 capsule (100 mg total) by mouth daily.   [DISCONTINUED] doxycycline (VIBRA-TABS) 100 MG tablet Take 1 tablet (100 mg total) by mouth every 12 (twelve) hours.     Allergies:   Latex   Social History   Socioeconomic History   Marital status: Married    Spouse name: Not on file   Number of children: 3   Years of education: Not on file   Highest education level: Not on file  Occupational History   Occupation: retired from [employer] Use   Smoking status: Former    Packs/day: 1.00    Years: 30.00    Total pack years: 30.00    Types: Cigarettes    Quit date: 04/06/2015    Years since quitting: 7.1    Passive exposure: Current (Wife is a smoker)   Smokeless tobacco: Never   Tobacco comments:    Quit the day of his STEMI  Substance and Sexual Activity   Alcohol use: No    Alcohol/week: 0.0 standard drinks of alcohol   Drug use: No   Sexual activity: Not on file  Other Topics Concern   Not on file  Social History Narrative   Lives in [place] with wife. Originally from [place].   Social Determinants of Health   Financial Resource Strain: Not on file  Food Insecurity: Not on file  Transportation Needs: Not on file  Physical Activity: Not on  file  Stress: Not on file  Social Connections:  Not on file     Family History: The patient's family history includes Cancer in his mother; Other in his mother and another family member. There is no history of Stroke. He was adopted.  ROS:   Please see the history of present illness.     EKGs/Labs/Other Studies Reviewed:    EKG:  The ekg ordered today demonstrates sinus bradycardia, anteroseptal infarct, heart rate 54, QRS duration 90 ms, no significant change from prior.  Recent Labs: 05/01/2022: Magnesium 2.0 05/02/2022: ALT 17 05/06/2022: BUN 15; Creatinine, Ser 1.21; Hemoglobin 12.4; Platelets 272; Potassium 4.0; Sodium 140   Recent Lipid Panel Lab Results  Component Value Date/Time   CHOL 86 05/01/2022 06:35 AM   TRIG 104 05/01/2022 06:35 AM   HDL 22 (L) 05/01/2022 06:35 AM   LDLCALC 43 05/01/2022 06:35 AM    Physical Exam:    VS:  BP (!) 102/50 (BP Location: Left Arm, Patient Position: Sitting, Cuff Size: Normal)   Pulse (!) 54   Ht '5\' 7"'$  (1.702 m)   Wt 181 lb (82.1 kg)   BMI 28.35 kg/m    No data found.       Wt Readings from Last 3 Encounters:  05/29/22 181 lb (82.1 kg)  05/27/22 181 lb 3.2 oz (82.2 kg)  05/08/22 182 lb 15.7 oz (83 kg)     GEN:  Well nourished, well developed in no acute distress HEENT: Normal NECK: No JVD; No carotid bruits CARDIAC: RRR, no murmurs, rubs, gallops RESPIRATORY:  Clear to auscultation without rales, wheezing or rhonchi  ABDOMEN: Soft, non-tender, non-distended MUSCULOSKELETAL: No edema SKIN: Warm and dry NEUROLOGIC:  Alert and oriented PSYCHIATRIC:  Normal affect    ASSESSMENT AND PLAN   CVA -Admitted 04/2022 with left PCA stroke, possibly related to 60% left carotid stenosis s/p TCAR 9/26 with Dr. Scot Dock -Continue DAPT -Follow-up with vascular surgery and neurology as scheduled. -Order 30 day heart monitor -patient is hesitant about logistics.  Will order 2-week Zio patch x 2 (back-to-back) to get 30 days worth of  heart rhythm monitoring.  R/o A-fib as a cause of his CVA.  Coronary artery disease, no angina -Anterior STEMI in 2016 status post PCI to the LAD, large infarct with delayed presentation -Despite significant RCA disease, Myoview did not show any evidence of ischemia. -Continue long-term Plavix for history of CAD, CVA and PAD. -Continue beta-blocker and statin therapy.  Ischemic cardiomyopathy, euvolemic -History of borderline blood pressure, therefore not on standing ARB/ACE -Has as needed Lasix --continue for weight gain of 3 pounds in 1 day or 5 pounds in 1 week.  He rarely uses. -Continue Coreg 6.25 mg twice daily.  Do not think he could tolerate higher dose with systolic blood pressure 161 today and heart rate 54. -He has as needed losartan, okay to take for systolic blood pressure over 160.   -Defer on SGLT2-I given borderline blood pressure/age.  Carotid artery disease -History of right vertebral artery occlusion -Followed by vascular -Has follow-up postop carotid duplex scheduled.  Hypertension, well controlled -Continue carvedilol. -Goal BP is <130/80.  Recommend DASH diet (high in vegetables, fruits, low-fat dairy products, whole grains, poultry, fish, and nuts and low in sweets, sugar-sweetened beverages, and red meats), salt restriction and increase physical activity.  Hyperlipidemia with goal LDL less than 55 -LDL 43 in September 2023.  Continue Crestor 40 mg daily.  Disposition - Follow-up in January as scheduled with Damon Walker.  We will follow-up heart monitor results in the meanwhile.  Medication Adjustments/Labs and Tests Ordered: Current medicines are reviewed at length with the patient today.  Concerns regarding medicines are outlined above.  Orders Placed This Encounter  Procedures   LONG TERM MONITOR (3-14 DAYS)   LONG TERM MONITOR (3-14 DAYS)   EKG 12-Lead   No orders of the defined types were placed in this encounter.   Patient Instructions   Medication Instructions:  TAKE Lasix 20 mg as needed for weight gain of 3 lbs in a day and 5 lbs in a week  TAKE Losartan 25 mg as needed for top number of blood pressure great than 160  *If you need a refill on your cardiac medications before your next appointment, please call your pharmacy*  Lab Work: NONE ordered at this time of appointment   If you have labs (blood work) drawn today and your tests are completely normal, you will receive your results only by: [place] (if you have MyChart) OR A paper copy in the mail If you have any lab test that is abnormal or we need to change your treatment, we will call you to review the results.  Testing/Procedures:  [facility]- Long Term Monitor Instructions  Caron Presume, PA-C has requested you wear a ZIO patch monitor for 14 days on 2 different occassions.  This is a single patch monitor. [employer] supplies one patch monitor per enrollment. Additional stickers are not available. Please do not apply patch if you will be having a Nuclear Stress Test,  Echocardiogram, Cardiac CT, MRI, or Chest Xray during the period you would be wearing the  monitor. The patch cannot be worn during these tests. You cannot remove and re-apply the  ZIO XT patch monitor.  Your ZIO patch monitor will be mailed 3 day USPS to your address on file. It may take 3-5 days  to receive your monitor after you have been enrolled.  Once you have received your monitor, please review the enclosed instructions. Your monitor  has already been registered assigning a specific monitor serial # to you.  Billing and Patient Assistance Program Information  We have supplied [employer] with any of your insurance information on file for billing purposes. [employer] offers a sliding scale Patient Assistance Program for patients that do not have  insurance, or whose insurance does not completely cover the cost of the ZIO monitor.  You must apply for the Patient Assistance Program to  qualify for this discounted rate.  To apply, please call [employer] at [phone], select option 4, select option 2, ask to apply for  Patient Assistance Program. Theodore Demark will ask your household income, and how many people  are in your household. They will quote your out-of-pocket cost based on that information.  [employer] will also be able to set up a 58-month interest-free payment plan if needed.  Applying the monitor   Shave hair from upper left chest.  Hold abrader disc by orange tab. Rub abrader in 40 strokes over the upper left chest as  indicated in your monitor instructions.  Clean area with 4 enclosed alcohol pads. Let dry.  Apply patch as indicated in monitor instructions. Patch will be placed under collarbone on left  side of chest with arrow pointing upward.  Rub patch adhesive wings for 2 minutes. Remove white label marked "1". Remove the white  label marked "2". Rub patch adhesive wings for 2 additional minutes.  While looking in a mirror, press and release button in center of patch. A small green light will  flash  3-4 times. This will be your only indicator that the monitor has been turned on.  Do not shower for the first 24 hours. You may shower after the first 24 hours.  Press the button if you feel a symptom. You will hear a small click. Record Date, Time and  Symptom in the Patient Logbook.  When you are ready to remove the patch, follow instructions on the last 2 pages of Patient  Logbook. Stick patch monitor onto the last page of Patient Logbook.  Place Patient Logbook in the blue and white box. Use locking tab on box and tape box closed  securely. The blue and white box has prepaid postage on it. Please place it in the mailbox as  soon as possible. Your physician should have your test results approximately 7 days after the  monitor has been mailed back to [facility].  Call [place] at [phone] if you have questions regarding  your ZIO XT  patch monitor. Call them immediately if you see an orange light blinking on your  monitor.  If your monitor falls off in less than 4 days, contact our Monitor department at [phone].  If your monitor becomes loose or falls off after 4 days call [employer] at [phone] for  suggestions on securing your monitor  Follow-Up: At [facility], you and your health needs are our priority.  As part of our continuing mission to provide you with exceptional heart care, we have created designated Provider Care Teams.  These Care Teams include your primary Cardiologist (physician) and Advanced Practice Providers (APPs -  Physician Assistants and Nurse Practitioners) who all work together to provide you with the care you need, when you need it.   Your next appointment:   As previously scheduled    The format for your next appointment:   In Person  Provider:   Glenetta Hew, MD     Other Instructions  Important Information About Sugar         Signed, Warren Lacy, PA-C  05/29/2022 10:03 AM    [place]

## 2022-05-27 NOTE — Patient Instructions (Addendum)
Continue Aspirin and Plavix HTN: BP goal <130/90.   HLD: LDL goal <70. Recent LDL 69.  DMII: A1c goal<7.0. Recent A1c 5.8.  Cardiac monitor ordered

## 2022-05-28 ENCOUNTER — Other Ambulatory Visit: Payer: Self-pay | Admitting: Adult Health

## 2022-05-28 DIAGNOSIS — Z8673 Personal history of transient ischemic attack (TIA), and cerebral infarction without residual deficits: Secondary | ICD-10-CM

## 2022-05-28 DIAGNOSIS — I639 Cerebral infarction, unspecified: Secondary | ICD-10-CM

## 2022-05-28 DIAGNOSIS — I4891 Unspecified atrial fibrillation: Secondary | ICD-10-CM

## 2022-05-29 ENCOUNTER — Ambulatory Visit (INDEPENDENT_AMBULATORY_CARE_PROVIDER_SITE_OTHER): Payer: Medicare Other

## 2022-05-29 ENCOUNTER — Other Ambulatory Visit: Payer: Self-pay

## 2022-05-29 ENCOUNTER — Other Ambulatory Visit: Payer: Self-pay | Admitting: Physician Assistant

## 2022-05-29 ENCOUNTER — Ambulatory Visit: Payer: Medicare Other

## 2022-05-29 ENCOUNTER — Ambulatory Visit: Payer: Medicare Other | Attending: Physician Assistant | Admitting: Physician Assistant

## 2022-05-29 VITALS — BP 102/50 | HR 54 | Ht 67.0 in | Wt 181.0 lb

## 2022-05-29 DIAGNOSIS — R41844 Frontal lobe and executive function deficit: Secondary | ICD-10-CM | POA: Diagnosis not present

## 2022-05-29 DIAGNOSIS — I10 Essential (primary) hypertension: Secondary | ICD-10-CM | POA: Diagnosis not present

## 2022-05-29 DIAGNOSIS — I63532 Cerebral infarction due to unspecified occlusion or stenosis of left posterior cerebral artery: Secondary | ICD-10-CM

## 2022-05-29 DIAGNOSIS — R4701 Aphasia: Secondary | ICD-10-CM | POA: Diagnosis not present

## 2022-05-29 DIAGNOSIS — I255 Ischemic cardiomyopathy: Secondary | ICD-10-CM | POA: Diagnosis not present

## 2022-05-29 DIAGNOSIS — E785 Hyperlipidemia, unspecified: Secondary | ICD-10-CM

## 2022-05-29 DIAGNOSIS — I25119 Atherosclerotic heart disease of native coronary artery with unspecified angina pectoris: Secondary | ICD-10-CM | POA: Diagnosis not present

## 2022-05-29 DIAGNOSIS — I639 Cerebral infarction, unspecified: Secondary | ICD-10-CM

## 2022-05-29 DIAGNOSIS — I4891 Unspecified atrial fibrillation: Secondary | ICD-10-CM

## 2022-05-29 DIAGNOSIS — R29818 Other symptoms and signs involving the nervous system: Secondary | ICD-10-CM | POA: Diagnosis not present

## 2022-05-29 DIAGNOSIS — I63512 Cerebral infarction due to unspecified occlusion or stenosis of left middle cerebral artery: Secondary | ICD-10-CM | POA: Diagnosis not present

## 2022-05-29 DIAGNOSIS — R41841 Cognitive communication deficit: Secondary | ICD-10-CM

## 2022-05-29 NOTE — Progress Notes (Unsigned)
Enrolled for [employer] to mail a ZIO XT long term holter monitor to the patients address on file.   Dr. Ellyn Hack to read.  2nd ZIO XT monitor to be mailed 06/13/22

## 2022-05-29 NOTE — Therapy (Signed)
[facility] OUTPATIENT SPEECH LANGUAGE PATHOLOGY EVALUATION   Patient Name: Damon Walker MRN: [id number] DOB:[birth year], 76 y.o., male Today's Date: 05/29/2022  PCP: [name], MD  REFERRING PROVIDER: Jennye Boroughs, MD    End of Session - 05/29/22 2303     Visit Number 1    Number of Visits 25    Date for SLP Re-Evaluation 08/27/22    SLP Start Time 1406    SLP Stop Time  1446    SLP Time Calculation (min) 40 min    Activity Tolerance Patient limited by lethargy;Patient tolerated treatment well             Past Medical History:  Diagnosis Date   Carotid arterial disease ([place]) 06/2014; 01/09/2015   Followed by Dr. Donnetta Hutching of [facility] -- a) Carotid u/s: 40-59% bilat ICA stenosis; b) 10/2019: Carotid Dopplers October 19, 2977: R ICA 8-92%, LICA 11-94%.  Right vertebral artery appears occluded.  Normal subclavian flow.  Normal left vertebral artery. 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  MRI Brain: Acute L Occipital Lobe Infarct. Chronic microvascular ischemic changes in the white matter and left pons.  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   Chronic combined systolic and diastolic congestive heart failure, NYHA class 2 ([place]) 05/11/2015 - 05/15/15   a. EF 20-25% with anterior-anteroseptal akinesis (immediately post anterior STEMI).  ; b. 10/10/'16 Echo: EF 35-40% with moderate mid-apical anteroseptal, anterior and apical hypokinesis.   Coronary artery disease involving native heart 05/11/2015   Cath: 3V CAD with 90% prox RCA, 80% mid RCA, 80% OM3, (RCA and OM residual treated medically) 100% LAD - PCI to Prox-Mid LAD with Overlapping Promus Premier DES 3.0 x 38 & 3.0 x 16 (post-dilated to 3.5 mm)    Essential hypertension    H/O: GI bleed  05/15/2015   s/p Sigmoid Polypectomy 05/04/2015 - prior to STEMI on May 11, 2015; Had GI Bleed following TPA for CVA, while on ASA & Brilinta; Flex Sig - 1. Internal Hemorrhoids, 2. Distal sigmoid polypectomy site with flat [place] status post biopsy and tattoo with 56mL spot over 3 injections 3. Proximal sigmoid polypectomy site seen as well, 4) otherwise normal with no evidence of bleed   Hyperlipidemia with target LDL less than 70    Ischemic cardiomyopathy 05/11/2015   EF 40% on cath 05/11/2015 after anterior STEMI, EF 20-25% on echo 05/13/2015; 12/2015 =  EF ~45%.   Liver disease, chronic, with cirrhosis ([place]) 11/2019   Mild possible early cirrhotic liver disease noted on CT scan.   Prediabetes Oct 2016   A1C 6.0 in Oct 2016   ST elevation myocardial infarction (STEMI) involving left anterior descending (LAD) coronary artery with complication ([place]) 17/11/812   100% Prox LAD - PCI with overlapping DES x 2   Tobacco abuse    a. 30 yrs - 1.5 ppd. - Quit 05/11/2015   Type 2 diabetes mellitus with other specified complication ([place]) 4/81/8563   Vision abnormalities    Right hemianopsia   Past Surgical History:  Procedure Laterality Date   CARDIAC CATHETERIZATION N/A 05/11/2015   Procedure: Left Heart Cath and Coronary Angiography;  Surgeon: Peter M Martinique, MD;  Location: [facility];  Service: Cardiovascular;  100% pLAD (long lesion). RCA - prox 90%, mid  80%. OM2 & OM3 80% (OM2 & 3 not necessarily PCI targets); EF 35-45% with Anterio HK   CARDIAC CATHETERIZATION N/A 05/11/2015   Procedure: Coronary Stent Intervention;  Surgeon: Peter M Martinique, MD;  Location: [facility];  Service: Cardiovascular; PCI to Prox-Mid LAD with Overlapping Promus Premier DES 3.0 x 38 & 3.0 x 16 (post-dilated to 3.5 mm)     FLEXIBLE SIGMOIDOSCOPY N/A 05/18/2015   Procedure: FLEXIBLE SIGMOIDOSCOPY;  Surgeon: Clarene Essex, MD;  Location: [facility];  Service: Endoscopy;  Laterality: N/A;   NM MYOVIEW LTD   08/2016   EF 30% with large anterior-anteroseptal and apical infarct consistent with LAD infarct. His HIGH RISK because of large infarct. No ischemia.   S/P Appendectomy     Age 36   S/P Inguinal Hernia Repair     In his 20's.   TRANSCAROTID ARTERY REVASCULARIZATION  Left 04/30/2022   Procedure: Transcarotid Artery Revascularization;  Surgeon: Angelia Mould, MD;  Location: [facility];  Service: Vascular;  Laterality: Left;   TRANSTHORACIC ECHOCARDIOGRAM  05/15/2015   Mild LVH, EF 35-40% - Mod HK of mid-apical anteroseptal, anterior & apical walls.  Gr 1 DD. Mild bilateral Atrial Enlargement.   TRANSTHORACIC ECHOCARDIOGRAM  12/2015   EF up to ~40-45% wiht Anterior-Anteroapical HK.  Mod LV dilation with increased LVEDP.   TRANSTHORACIC ECHOCARDIOGRAM  12/'17; 4/'18   a) EF ~30-35% (in setting of HTN Urgency). - Gr 2 DD;; b) EF 30-35% (per Dr. Sallyanne Kuster - 35-40%). Anterior-anteroapical Akinesis. Gr2 DD w/ high filling pressures. Mild LA dilation. Mild RV dilation.   ULTRASOUND GUIDANCE FOR VASCULAR ACCESS Right 04/30/2022   Procedure: ULTRASOUND GUIDANCE FOR VASCULAR ACCESS;  Surgeon: Angelia Mould, MD;  Location: [facility];  Service: Vascular;  Laterality: Right;   Patient Active Problem List   Diagnosis Date Noted   Acute blood loss anemia 05/08/2022   Cellulitis--right forearm 05/08/2022   Malnutrition of moderate degree 05/04/2022   Acute ischemic left middle cerebral artery (MCA) stroke ([place]) 05/01/2022   Hypokalemia 04/25/2022   Acute respiratory failure with hypoxia and hypercapnia ([facility])    Acute CVA (cerebrovascular accident) ([place]) 04/21/2022   Combined systolic and diastolic heart failure ([place]) 04/21/2022   Abnormal bowel movement 10/23/2021   Hearing loss 10/23/2021   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   Personal history of colonic polyps 10/23/2021   Personal history of other diseases of the digestive system 10/23/2021   Psoriasis 10/23/2021    Type 2 diabetes mellitus with other specified complication ([place]) 32/20/2542   Abnormal abdominal CT scan 12/26/2019   Claudication ([place]) 10/24/2019   Trochanteric bursitis of right hip 12/22/2018   CKD (chronic kidney disease), stage III ([place]) 07/08/2016   Acute respiratory failure with hypoxia ([place]) 07/08/2016   GERD (gastroesophageal reflux disease) 07/08/2016   Coronary artery disease involving native coronary artery of native heart with angina pectoris ([place]) 07/05/2015   Depression 07/05/2015   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 ([place]) 05/18/2015   Right homonymous hemianopsia 05/18/2015   Gait disturbance, post-stroke 05/18/2015   ST elevation myocardial infarction (STEMI) of anterior wall, subsequent episode of care ([place]) 05/15/2015   Lower GI bleed 05/15/2015   Carotid arterial disease ([place]) 05/11/2015   Cardiomyopathy, ischemic: EF most recently 35-40%) 05/11/2015   CAD-native artery 05/11/2015    Class: Status post   Presence of drug coated stent in LAD coronary artery 05/08/2015   Prediabetes 05/06/2015   Essential hypertension 07/05/2014   Hyperlipidemia with target  LDL less than 70 07/05/2014   Former heavy cigarette smoker (20-39 per day) 07/05/2014    ONSET DATE: 04/21/22   REFERRING DIAG: T88.828 (ICD-10-CM) - Cerebral infarction due to unspecified occlusion or stenosis of left middle cerebral artery   THERAPY DIAG:  Cognitive communication deficit  Aphasia  Rationale for Evaluation and Treatment Rehabilitation  SUBJECTIVE:   SUBJECTIVE STATEMENT: "No, I think my memory is - is good." (Pt, when asked by SLP if he was having short term memory problems) "We have to get them - - have to take them in." (Pt, explaining what needs to be done with his hearing aids) Wife corroborated pt's explanation.   Pt accompanied by: significant other, wife Mateo Flow  PERTINENT HISTORY:  chronic combined CHF, L PCA CVA with R hemianopsia (2016), STEMI  (2016), CKD III, HTN, HH/GERD, CAD. Left transcarotid artery revascularization (TCAR) w/ surgical balloon and stent placement on 04/30/22  PAIN:  Are you having pain? No   FALLS: Has patient fallen in last 6 months?  No  LIVING ENVIRONMENT: Lives with: lives with their spouse Lives in: House/apartment  PLOF:  Level of assistance: Independent with ADLs Employment: Retired - enjoys playing games on his computer   PATIENT GOALS  Pt acknowledges his speech does not seem right.  OBJECTIVE:   DIAGNOSTIC FINDINGS:  ST discharge summary from CIR 05-07-22: Clinical Impression/Discharge Summary:  Patient has made functional gains and has met 4 of 4 LTGs this admission. Currently, patient continues to demonstrate an overall moderate aphasia impacting both his receptive and expressive language which is further exacerbated by current hearing impairments.  Overall, patient requires Mod A verbal cues to express his basic wants/needs at the phrase level and for comprehension of basic information with multiple modalities needed at times for clarification. Patient also demonstrates improvement in sustained attention during functional tasks. Patient and family education is complete and patient will discharge home with 24 hour supervision from family. Patient would benefit from f/u SLP services to maximize his cognitive-linguistic functioning and overall functional independence in order to reduce caregiver burden.  Care Partner:  Caregiver Able to Provide Assistance: Yes  Type of Caregiver Assistance: Physical;Cognitive Recommendation:  24 hour supervision/assistance;Outpatient SLP  Rationale for SLP Follow Up: Reduce caregiver burden;Maximize cognitive function and independence;Maximize functional communication    Imaging showed acute infarct in the left parietal lobe without hemorrhage.    COGNITION: SLP provided instructions on Cognitive Linguistic Quick Test (CLQT) slowly and at a volume pt told SLP  he could hear, taking into consideration pt's dx of aphasia from CIR and his hearing impairment. Overall cognitive status: Impaired Areas of impairment:  Attention: Impaired: Sustained, Selective, Alternating, Divided, Comment: Pt consistently did not hold eye contact with SLP sitting on his left. Memory: Impaired: Immediate Short term Pt asking and looking to wife to tell SLP details of CIR ST and req'd mod-max cues from wife to recall events of the previous 3-4 days. Pt told SLP 4/6 bills that are autopay and recalled the other two when prompted by wife. "I always do that (pay the bills)," pt stated. Awareness: Impaired: Intellectual, Emergent, Anticipatory, and Error awareness: 0% on CLQT. Without any error awareness, pt cancelled rounded and pointed symbols, drew two hands (no numbers) for his clock after being told instructions (and then told SLP, upon questioning immediately afterwards, that his instructions were "To put hands at 10 minutes before 11"), and did not include his apartment unit with reciting his address. On a second attempt at the  clock drawing (after hearing the instructions stated slowly for a third time), pt put in the numbers with questionable spacing (due to homonymous hemianopsia?) but set hands to incorrect time without proper origination of hands. Executive function: Impaired: Problem solving, Organization, Planning, Error awareness, Self-correction, and Slow processing. Behavior: pt appeared lethargic at times due to decr'd awareness and minimal eye contact. Once with pt staring off, SLP began instructions to picture ID on CLQT and pt visibly jumped in his chair as if startled.  Functional deficits: Pt wife states he can no longer work the [employer] remote nor can he recall/sequence steps to play games on his computer.  COGNITIVE COMMUNICATION Following directions: Pt's last ST note from CIR (05-07-22) states he followed commands of 1 and 2 step with 75% success with written  cues. Auditory comprehension: Impaired: Difficult to ascertain at this time whether pt's auditory comprehension is affected or if pt's attention is affecting his overall comprehension  Verbal expression: Impaired: however pt was able to independently produce WFL/WNL responses to most of SLP's questions when SLP developing rapport prior to formal testing.   Pt made two aphasic errors during eval today, "months"/years, and "autopake"/autopay. Functional communication: Impaired: due to awareness and attention deficits.  ORAL MOTOR EXAMINATION Overall status: Did not assess Comments: due to time constraints  STANDARDIZED ASSESSMENTS: CLQT was begun today due to SLP's interaction with pt demonstrating that pt's language skills were WFL/adequate to initiate CLQT today with speech at slower rate and louder volume.    PATIENT REPORTED OUTCOME MEASURES (PROM): To be completed in first 2-3 sessions.   TODAY'S TREATMENT:  SLP educated pt and wife about some of pt's deficit areas (attention, awareness). SLP provided wife with some ideas of tasks pt could accomplish at home to target attention. SLP suggested pt practice writing out checks prior to writing out two checks with wife supervising. Mateo Flow told SLP she would like to return to work the end of November and asked SLP if he thought that was going to be possible. SLP told Mateo Flow that may be too soon but we would need to see how pt progresses.  When told that SLP schedule was limited mostly to some 3:30 pm slots for the next 4 weeks pt wife inquired if there were other sites that offered ST. Mateo Flow was pleased to hear about [facility], and SLP offered to have her receive ST services there if availability allowed.    PATIENT EDUCATION: Education details: see "today's treatment" Person educated: Patient and Spouse Education method: Explanation and Demonstration Education comprehension: verbalized understanding     GOALS: Goals reviewed with  patient? No To be done at first therapy session  SHORT TERM GOALS: Target date: 06/26/2022    Pt will complete CLQT and goals added PRN Baseline: Goal status: INITIAL  2.  Pt will complete receptive and expressive language assessment, and PROM PRN Baseline:  Goal status: INITIAL  3.  Pt will attend to functional language tasks for 5 minutes, x5/session Baseline:  Goal status: INITIAL  4.  Pt will indicate awareness of 2 cognitive deficits in 3 sessions Baseline:  Goal status: INITIAL  5.  TBD Baseline:  Goal status: INITIAL  6.  TBD Baseline:  Goal status: INITIAL  LONG TERM GOALS: Target date: 08/21/2022    Pt will complete receptive and expressive language tasks with 100% accuracy given compensations in 3 sessions Baseline:  Goal status: INITIAL  2.  With written cues, pt will accurately complete sequence to play 1-2  games on his computer between 3 sessions Baseline:  Goal status: INITIAL  3.  Pt will describe 3 compensations he uses/ has used to improve memory in 3 sessions Baseline:  Goal status: INITIAL  4.  TBD Baseline:  Goal status: INITIAL  5.  TBD Baseline:  Goal status: INITIAL  6.  TBD Baseline:  Goal status: INITIAL  ASSESSMENT:  CLINICAL IMPRESSION: Patient is a 76 y.o. male who was seen today for initiation of cognitive communication testing. Wife reports "He gets confused all the time" when asked what she thinks is his most significant deficit post-CVA and provided examples of inability to work the [employer] remote and to sequence steps to play games on his computer. He is not managing/tracking appointments, nor is he paying bills - which he did prior to CVA.    OBJECTIVE IMPAIRMENTS include attention, memory, awareness, executive functioning, expressive language, and receptive language. These impairments are limiting patient from managing appointments, managing finances, household responsibilities, ADLs/IADLs, and effectively communicating  at home and in community. Factors affecting potential to achieve goals and functional outcome are ability to learn/carryover information, co-morbidities, and severity of impairments.. Patient will benefit from skilled SLP services to address above impairments and improve overall function.  REHAB POTENTIAL: Good  PLAN: SLP FREQUENCY: 2x/week  SLP DURATION: 12 weeks  PLANNED INTERVENTIONS: Language facilitation, Environmental controls, Cueing hierachy, Cognitive reorganization, Internal/external aids, Functional tasks, Multimodal communication approach, SLP instruction and feedback, Compensatory strategies, and Patient/family education    [facility], [place] 05/29/2022, 11:04 PM

## 2022-05-29 NOTE — Patient Instructions (Addendum)
Medication Instructions:  TAKE Lasix 20 mg as needed for weight gain of 3 lbs in a day and 5 lbs in a week  TAKE Losartan 25 mg as needed for top number of blood pressure great than 160  *If you need a refill on your cardiac medications before your next appointment, please call your pharmacy*  Lab Work: NONE ordered at this time of appointment   If you have labs (blood work) drawn today and your tests are completely normal, you will receive your results only by: [place] (if you have MyChart) OR A paper copy in the mail If you have any lab test that is abnormal or we need to change your treatment, we will call you to review the results.  Testing/Procedures:  [facility]- Long Term Monitor Instructions  Caron Presume, PA-C has requested you wear a ZIO patch monitor for 14 days on 2 different occassions.  This is a single patch monitor. [employer] supplies one patch monitor per enrollment. Additional stickers are not available. Please do not apply patch if you will be having a Nuclear Stress Test,  Echocardiogram, Cardiac CT, MRI, or Chest Xray during the period you would be wearing the  monitor. The patch cannot be worn during these tests. You cannot remove and re-apply the  ZIO XT patch monitor.  Your ZIO patch monitor will be mailed 3 day USPS to your address on file. It may take 3-5 days  to receive your monitor after you have been enrolled.  Once you have received your monitor, please review the enclosed instructions. Your monitor  has already been registered assigning a specific monitor serial # to you.  Billing and Patient Assistance Program Information  We have supplied [employer] with any of your insurance information on file for billing purposes. [employer] offers a sliding scale Patient Assistance Program for patients that do not have  insurance, or whose insurance does not completely cover the cost of the ZIO monitor.  You must apply for the Patient Assistance Program to  qualify for this discounted rate.  To apply, please call [employer] at [phone], select option 4, select option 2, ask to apply for  Patient Assistance Program. Theodore Demark will ask your household income, and how many people  are in your household. They will quote your out-of-pocket cost based on that information.  [employer] will also be able to set up a 29-month interest-free payment plan if needed.  Applying the monitor   Shave hair from upper left chest.  Hold abrader disc by orange tab. Rub abrader in 40 strokes over the upper left chest as  indicated in your monitor instructions.  Clean area with 4 enclosed alcohol pads. Let dry.  Apply patch as indicated in monitor instructions. Patch will be placed under collarbone on left  side of chest with arrow pointing upward.  Rub patch adhesive wings for 2 minutes. Remove white label marked "1". Remove the white  label marked "2". Rub patch adhesive wings for 2 additional minutes.  While looking in a mirror, press and release button in center of patch. A small green light will  flash 3-4 times. This will be your only indicator that the monitor has been turned on.  Do not shower for the first 24 hours. You may shower after the first 24 hours.  Press the button if you feel a symptom. You will hear a small click. Record Date, Time and  Symptom in the Patient Logbook.  When you are ready to remove the patch,  follow instructions on the last 2 pages of Patient  Logbook. Stick patch monitor onto the last page of Patient Logbook.  Place Patient Logbook in the blue and white box. Use locking tab on box and tape box closed  securely. The blue and white box has prepaid postage on it. Please place it in the mailbox as  soon as possible. Your physician should have your test results approximately 7 days after the  monitor has been mailed back to [facility].  Call [facility] at [phone] if you have questions regarding  your ZIO XT  patch monitor. Call them immediately if you see an orange light blinking on your  monitor.  If your monitor falls off in less than 4 days, contact our Monitor department at [phone].  If your monitor becomes loose or falls off after 4 days call [employer] at [phone] for  suggestions on securing your monitor  Follow-Up: At [facility], you and your health needs are our priority.  As part of our continuing mission to provide you with exceptional heart care, we have created designated Provider Care Teams.  These Care Teams include your primary Cardiologist (physician) and Advanced Practice Providers (APPs -  Physician Assistants and Nurse Practitioners) who all work together to provide you with the care you need, when you need it.   Your next appointment:   As previously scheduled    The format for your next appointment:   In Person  Provider:   Glenetta Hew, MD     Other Instructions  Important Information About [facility]

## 2022-05-29 NOTE — Progress Notes (Unsigned)
2nd consecutive 14 day ZIO XT monitor. Enrolled for [employer] to mail a ZIO XT long term holter monitor to the patients address on file for 16/4/29 application.  Dr. Ellyn Hack to read.

## 2022-06-03 ENCOUNTER — Telehealth: Payer: Self-pay | Admitting: *Deleted

## 2022-06-03 ENCOUNTER — Ambulatory Visit: Payer: Medicare Other | Admitting: Speech Pathology

## 2022-06-03 DIAGNOSIS — R4701 Aphasia: Secondary | ICD-10-CM

## 2022-06-03 DIAGNOSIS — R41841 Cognitive communication deficit: Secondary | ICD-10-CM

## 2022-06-03 DIAGNOSIS — I4891 Unspecified atrial fibrillation: Secondary | ICD-10-CM

## 2022-06-03 DIAGNOSIS — I639 Cerebral infarction, unspecified: Secondary | ICD-10-CM | POA: Diagnosis not present

## 2022-06-03 DIAGNOSIS — I63512 Cerebral infarction due to unspecified occlusion or stenosis of left middle cerebral artery: Secondary | ICD-10-CM | POA: Diagnosis not present

## 2022-06-03 DIAGNOSIS — I63532 Cerebral infarction due to unspecified occlusion or stenosis of left posterior cerebral artery: Secondary | ICD-10-CM | POA: Diagnosis not present

## 2022-06-03 DIAGNOSIS — R41844 Frontal lobe and executive function deficit: Secondary | ICD-10-CM | POA: Diagnosis not present

## 2022-06-03 DIAGNOSIS — R29818 Other symptoms and signs involving the nervous system: Secondary | ICD-10-CM | POA: Diagnosis not present

## 2022-06-03 NOTE — Telephone Encounter (Signed)
I have completed.  And to provider for signature.

## 2022-06-03 NOTE — Telephone Encounter (Signed)
FMLA signed.  To medical records.

## 2022-06-03 NOTE — Therapy (Signed)
[facility] OUTPATIENT SPEECH LANGUAGE PATHOLOGY EVALUATION   Patient Name: Damon Walker MRN: [id number] DOB:09-06-1945, 76 y.o., male Today's Date: 06/03/2022  PCP: [name], MD  REFERRING PROVIDER: Jennye Boroughs, MD    End of Session - 06/03/22 [phone]     Visit Number 2    Number of Visits 25    Date for SLP Re-Evaluation 08/27/22    SLP Start Time 0800    SLP Stop Time  0845    SLP Time Calculation (min) 45 min    Activity Tolerance Patient limited by lethargy;Patient tolerated treatment well             Past Medical History:  Diagnosis Date   Carotid arterial disease ([place]) 06/2014; 01/09/2015   Followed by Dr. Donnetta Hutching of [facility] -- a) Carotid u/s: 40-59% bilat ICA stenosis; b) 10/2019: Carotid Dopplers October 20, 8183: R ICA 6-31%, LICA 49-70%.  Right vertebral artery appears occluded.  Normal subclavian flow.  Normal left vertebral artery. 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  MRI Brain: Acute L Occipital Lobe Infarct. Chronic microvascular ischemic changes in the white matter and left pons.  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   Chronic combined systolic and diastolic congestive heart failure, NYHA class 2 ([place]) 05/11/2015 - 05/15/15   a. EF 20-25% with anterior-anteroseptal akinesis (immediately post anterior STEMI).  ; b. 10/10/'16 Echo: EF 35-40% with moderate mid-apical anteroseptal, anterior and apical hypokinesis.   Coronary artery disease involving native heart 05/11/2015   Cath: 3V CAD with 90% prox RCA, 80% mid RCA, 80% OM3, (RCA and OM residual treated medically) 100% LAD - PCI to Prox-Mid LAD with Overlapping Promus Premier DES 3.0 x 38 & 3.0 x 16 (post-dilated to 3.5 mm)    Essential hypertension    H/O: GI bleed  05/15/2015   s/p Sigmoid Polypectomy 05/04/2015 - prior to STEMI on May 11, 2015; Had GI Bleed following TPA for CVA, while on ASA & Brilinta; Flex Sig - 1. Internal Hemorrhoids, 2. Distal sigmoid polypectomy site with flat [place] status post biopsy and tattoo with 34m spot over 3 injections 3. Proximal sigmoid polypectomy site seen as well, 4) otherwise normal with no evidence of bleed   Hyperlipidemia with target LDL less than 70    Ischemic cardiomyopathy 05/11/2015   EF 40% on cath 05/11/2015 after anterior STEMI, EF 20-25% on echo 05/13/2015; 12/2015 =  EF ~45%.   Liver disease, chronic, with cirrhosis ([place] 11/2019   Mild possible early cirrhotic liver disease noted on CT scan.   Prediabetes Oct 2016   A1C 6.0 in Oct 2016   ST elevation myocardial infarction (STEMI) involving left anterior descending (LAD) coronary artery with complication ([place] 126/10/7856  100% Prox LAD - PCI with overlapping DES x 2   Tobacco abuse    a. 30 yrs - 1.5 ppd. - Quit 05/11/2015   Type 2 diabetes mellitus with other specified complication ([place] 38/50/2774  Vision abnormalities    Right hemianopsia   Past Surgical History:  Procedure Laterality Date   CARDIAC CATHETERIZATION N/A 05/11/2015   Procedure: Left Heart Cath and Coronary Angiography;  Surgeon: Peter M JMartinique MD;  [facility];  Service: Cardiovascular;  100% pLAD (long lesion). RCA - prox 90%, mid  80%. OM2 & OM3 80% (OM2 & 3 not necessarily PCI targets); EF 35-45% with Anterio HK   CARDIAC CATHETERIZATION N/A 05/11/2015   Procedure: Coronary Stent Intervention;  Surgeon: Peter M Martinique, MD;  Location: [facility];  Service: Cardiovascular; PCI to Prox-Mid LAD with Overlapping Promus Premier DES 3.0 x 38 & 3.0 x 16 (post-dilated to 3.5 mm)     FLEXIBLE SIGMOIDOSCOPY N/A 05/18/2015   Procedure: FLEXIBLE SIGMOIDOSCOPY;  Surgeon: Clarene Essex, MD;  Location: [facility];  Service: Endoscopy;  Laterality: N/A;   NM MYOVIEW LTD   08/2016   EF 30% with large anterior-anteroseptal and apical infarct consistent with LAD infarct. His HIGH RISK because of large infarct. No ischemia.   S/P Appendectomy     Age 36   S/P Inguinal Hernia Repair     In his 20's.   TRANSCAROTID ARTERY REVASCULARIZATION  Left 04/30/2022   Procedure: Transcarotid Artery Revascularization;  Surgeon: Angelia Mould, MD;  Location: [facility];  Service: Vascular;  Laterality: Left;   TRANSTHORACIC ECHOCARDIOGRAM  05/15/2015   Mild LVH, EF 35-40% - Mod HK of mid-apical anteroseptal, anterior & apical walls.  Gr 1 DD. Mild bilateral Atrial Enlargement.   TRANSTHORACIC ECHOCARDIOGRAM  12/2015   EF up to ~40-45% wiht Anterior-Anteroapical HK.  Mod LV dilation with increased LVEDP.   TRANSTHORACIC ECHOCARDIOGRAM  12/'17; 4/'18   a) EF ~30-35% (in setting of HTN Urgency). - Gr 2 DD;; b) EF 30-35% (per Dr. Sallyanne Kuster - 35-40%). Anterior-anteroapical Akinesis. Gr2 DD w/ high filling pressures. Mild LA dilation. Mild RV dilation.   ULTRASOUND GUIDANCE FOR VASCULAR ACCESS Right 04/30/2022   Procedure: ULTRASOUND GUIDANCE FOR VASCULAR ACCESS;  Surgeon: Angelia Mould, MD;  Location: [facility];  Service: Vascular;  Laterality: Right;   Patient Active Problem List   Diagnosis Date Noted   Acute blood loss anemia 05/08/2022   Cellulitis--right forearm 05/08/2022   Malnutrition of moderate degree 05/04/2022   Acute ischemic left middle cerebral artery (MCA) stroke ([place]) 05/01/2022   Hypokalemia 04/25/2022   Acute respiratory failure with hypoxia and hypercapnia ([facility])    Acute CVA (cerebrovascular accident) ([place]) 04/21/2022   Combined systolic and diastolic heart failure ([place]) 04/21/2022   Abnormal bowel movement 10/23/2021   Hearing loss 10/23/2021   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   Personal history of colonic polyps 10/23/2021   Personal history of other diseases of the digestive system 10/23/2021   Psoriasis 10/23/2021    Type 2 diabetes mellitus with other specified complication ([place]) 32/20/2542   Abnormal abdominal CT scan 12/26/2019   Claudication ([place]) 10/24/2019   Trochanteric bursitis of right hip 12/22/2018   CKD (chronic kidney disease), stage III ([place]) 07/08/2016   Acute respiratory failure with hypoxia ([place]) 07/08/2016   GERD (gastroesophageal reflux disease) 07/08/2016   Coronary artery disease involving native coronary artery of native heart with angina pectoris ([place]) 07/05/2015   Depression 07/05/2015   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 ([place]) 05/18/2015   Right homonymous hemianopsia 05/18/2015   Gait disturbance, post-stroke 05/18/2015   ST elevation myocardial infarction (STEMI) of anterior wall, subsequent episode of care ([place]) 05/15/2015   Lower GI bleed 05/15/2015   Carotid arterial disease ([place]) 05/11/2015   Cardiomyopathy, ischemic: EF most recently 35-40%) 05/11/2015   CAD-native artery 05/11/2015    Class: Status post   Presence of drug coated stent in LAD coronary artery 05/08/2015   Prediabetes 05/06/2015   Essential hypertension 07/05/2014   Hyperlipidemia with target  LDL less than 70 07/05/2014   Former heavy cigarette smoker (20-39 per day) 07/05/2014    ONSET DATE: 04/21/22   REFERRING DIAG: P10.258 (ICD-10-CM) - Cerebral infarction due to unspecified occlusion or stenosis of left middle cerebral artery   THERAPY DIAG:  Aphasia  Cognitive communication deficit  Rationale for Evaluation and Treatment Rehabilitation  SUBJECTIVE:   SUBJECTIVE STATEMENT: "Ok"  PAIN:  Are you having pain? No  OBJECTIVE:  PATIENT REPORTED OUTCOME MEASURES (PROM): To be completed in first 2-3 sessions.   TODAY'S TREATMENT:  06/03/22: SLP completed CLQT, standardized cognitive assessment. Pt with increased attention demonstrated throughout session from baseline at eval (pt reportedly zoning out during initial visit), appears to maintain attention to  therapist and converation throughout. Requesting repetitions frequently, likely 2/2 attention and hearing impairment. Spouse reports pt is now able to navigate to and open preferred game at home without her A.  CLQT: Attention: Severe, Memory: Moderate, Executive Function: Moderate, Language: Moderate, Visuospatial Skills: Moderate, and Clock Drawing: Severe  Overall: Moderate severity  Comments: SLP required to speak slowly and loudly. Pt requesting frequent repetitions. During story re-tell, says "I wasn't paying attention," when questioned, says this happens frequently in conversations. X1 instance of emergent awareness during design generation, however, then pt perseverates on single design without awareness. Attempts maze from both directions without success.   05/29/22: SLP educated pt and wife about some of pt's deficit areas (attention, awareness). SLP provided wife with some ideas of tasks pt could accomplish at home to target attention. SLP suggested pt practice writing out checks prior to writing out two checks with wife supervising. Mateo Flow told SLP she would like to return to work the end of November and asked SLP if he thought that was going to be possible. SLP told Mateo Flow that may be too soon but we would need to see how pt progresses.  When told that SLP schedule was limited mostly to some 3:30 pm slots for the next 4 weeks pt wife inquired if there were other sites that offered ST. Mateo Flow was pleased to hear about [facility], and SLP offered to have her receive ST services there if availability allowed.    PATIENT EDUCATION: Education details: see "today's treatment" Person educated: Patient and Spouse Education method: Explanation and Demonstration Education comprehension: verbalized understanding     GOALS: Goals reviewed with patient? No To be done at first therapy session  SHORT TERM GOALS: Target date: 06/26/2022    Pt will complete CLQT and goals added  PRN Baseline: Goal status: MET  2.  Pt will complete receptive and expressive language assessment, and PROM PRN Baseline:  Goal status: INITIAL  3.  Pt will attend to functional language tasks for 5 minutes, x5/session Baseline:  Goal status: INITIAL  4.  Pt will indicate awareness of 2 cognitive deficits in 3 sessions Baseline:  Goal status: INITIAL  5.  Pt will write x3 checks with 100% accuracy with use of compensations PRN Baseline:  Goal status: INITIAL   LONG TERM GOALS: Target date: 08/21/2022    Pt will complete receptive and expressive language tasks with 100% accuracy given compensations in 3 sessions Baseline:  Goal status: INITIAL  2.  With written cues, pt will accurately complete sequence to play 1-2 games on his computer between 3 sessions Baseline:  Goal status: INITIAL  3.  Pt will describe 3 compensations he uses/ has used to improve memory in 3 sessions Baseline:  Goal status: INITIAL  4.  Pt will  attend to structured task for 10 minutes x2/session with no more than 1 redirection required per trial Baseline:  Goal status: INITIAL  5.  Pt and spouse will report initiation and completion of x2 home based tasks over 1 week period with mod-I Baseline:  Goal status: INITIAL   ASSESSMENT:  CLINICAL IMPRESSION: Patient is a 76 y.o. male who was seen today for initiation of cognitive communication testing. Wife reports "He gets confused all the time" when asked what she thinks is his most significant deficit post-CVA and provided examples of inability to work the [employer] remote and to sequence steps to play games on his computer. He is not managing/tracking appointments, nor is he paying bills - which he did prior to CVA.    OBJECTIVE IMPAIRMENTS include attention, memory, awareness, executive functioning, expressive language, and receptive language. These impairments are limiting patient from managing appointments, managing finances, household  responsibilities, ADLs/IADLs, and effectively communicating at home and in community. Factors affecting potential to achieve goals and functional outcome are ability to learn/carryover information, co-morbidities, and severity of impairments.. Patient will benefit from skilled SLP services to address above impairments and improve overall function.  REHAB POTENTIAL: Good  PLAN: SLP FREQUENCY: 2x/week  SLP DURATION: 12 weeks  PLANNED INTERVENTIONS: Language facilitation, Environmental controls, Cueing hierachy, Cognitive reorganization, Internal/external aids, Functional tasks, Multimodal communication approach, SLP instruction and feedback, Compensatory strategies, and Patient/family education    Su Monks, CCC-SLP 06/03/2022, 8:18 AM

## 2022-06-03 NOTE — Telephone Encounter (Signed)
I called Damon Walker, wife of pt.  I asked her about the FMLA form that we received.  I relayed that [name], NP could not justify time for her and being off until Jul 04, 2022 to care for pt.  She said that he has speech difficulty, but also has memory/processing issues and that is her main concern, of not being safe for him.  She said she has used her vacation time and is not getting paid but that is not really the issue, it is safety for pt.  She stated that saw SLT today and is noted that even the 30th was hard to predict. "05/29/22: SLP educated pt and wife about some of pt's deficit areas (attention, awareness). SLP provided wife with some ideas of tasks pt could accomplish at home to target attention. SLP suggested pt practice writing out checks prior to writing out two checks with wife supervising. Damon Walker told SLP she would like to return to work the end of November and asked SLP if he thought that was going to be possible. SLP told Damon Walker that may be too soon but we would need to see how pt progresses.  When told that SLP schedule was limited mostly to some 3:30 pm slots for the next 4 weeks pt wife inquired if there were other sites that offered ST. Damon Walker was pleased to hear about [facility], and SLP offered to have her receive ST services there if availability allowed".     Please advise about FMLA for wife. She would like call back today.

## 2022-06-03 NOTE — Telephone Encounter (Signed)
I'll write till 11/30

## 2022-06-07 ENCOUNTER — Encounter: Payer: Medicare Other | Admitting: Speech Pathology

## 2022-06-10 ENCOUNTER — Ambulatory Visit: Payer: Medicare Other | Attending: Physical Medicine & Rehabilitation | Admitting: Speech Pathology

## 2022-06-10 DIAGNOSIS — R4701 Aphasia: Secondary | ICD-10-CM | POA: Insufficient documentation

## 2022-06-10 DIAGNOSIS — R41841 Cognitive communication deficit: Secondary | ICD-10-CM | POA: Diagnosis not present

## 2022-06-10 NOTE — Therapy (Signed)
[facility] OUTPATIENT SPEECH LANGUAGE PATHOLOGY TREATMENT   Patient Name: Damon Walker MRN: [id number] DOB:1946-06-06, 76 y.o., male Today's Date: 06/10/2022  PCP: [name], MD  REFERRING PROVIDER: Jennye Boroughs, MD    End of Session - 06/10/22 [phone]     Visit Number 3    Number of Visits 25    Date for SLP Re-Evaluation 08/27/22    SLP Start Time 0845    SLP Stop Time  0930    SLP Time Calculation (min) 45 min    Activity Tolerance Patient limited by lethargy;Patient tolerated treatment well              Past Medical History:  Diagnosis Date   Carotid arterial disease ([place]) 06/2014; 01/09/2015   Followed by Dr. Donnetta Hutching of [facility] -- a) Carotid u/s: 40-59% bilat ICA stenosis; b) 10/2019: Carotid Dopplers October 20, 6438: R ICA 3-47%, LICA 42-59%.  Right vertebral artery appears occluded.  Normal subclavian flow.  Normal left vertebral artery. 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  MRI Brain: Acute L Occipital Lobe Infarct. Chronic microvascular ischemic changes in the white matter and left pons.  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   Chronic combined systolic and diastolic congestive heart failure, NYHA class 2 ([place]) 05/11/2015 - 05/15/15   a. EF 20-25% with anterior-anteroseptal akinesis (immediately post anterior STEMI).  ; b. 10/10/'16 Echo: EF 35-40% with moderate mid-apical anteroseptal, anterior and apical hypokinesis.   Coronary artery disease involving native heart 05/11/2015   Cath: 3V CAD with 90% prox RCA, 80% mid RCA, 80% OM3, (RCA and OM residual treated medically) 100% LAD - PCI to Prox-Mid LAD with Overlapping Promus Premier DES 3.0 x 38 & 3.0 x 16 (post-dilated to 3.5 mm)    Essential hypertension    H/O: GI bleed  05/15/2015   s/p Sigmoid Polypectomy 05/04/2015 - prior to STEMI on May 11, 2015; Had GI Bleed following TPA for CVA, while on ASA & Brilinta; Flex Sig - 1. Internal Hemorrhoids, 2. Distal sigmoid polypectomy site with flat [place] status post biopsy and tattoo with 30m spot over 3 injections 3. Proximal sigmoid polypectomy site seen as well, 4) otherwise normal with no evidence of bleed   Hyperlipidemia with target LDL less than 70    Ischemic cardiomyopathy 05/11/2015   EF 40% on cath 05/11/2015 after anterior STEMI, EF 20-25% on echo 05/13/2015; 12/2015 =  EF ~45%.   Liver disease, chronic, with cirrhosis ([place] 11/2019   Mild possible early cirrhotic liver disease noted on CT scan.   Prediabetes Oct 2016   A1C 6.0 in Oct 2016   ST elevation myocardial infarction (STEMI) involving left anterior descending (LAD) coronary artery with complication ([facility] 156/10/8754  100% Prox LAD - PCI with overlapping DES x 2   Tobacco abuse    a. 30 yrs - 1.5 ppd. - Quit 05/11/2015   Type 2 diabetes mellitus with other specified complication ([place] [place] [facility] 34/33/2951  Vision abnormalities    Right hemianopsia   Past Surgical History:  Procedure Laterality Date   CARDIAC CATHETERIZATION N/A 05/11/2015   Procedure: Left Heart Cath and Coronary Angiography;  Surgeon: Peter M JMartinique MD;  [facility];  Service: Cardiovascular;  100% pLAD (long lesion). RCA - prox 90%,  mid 80%. OM2 & OM3 80% (OM2 & 3 not necessarily PCI targets); EF 35-45% with Anterio HK   CARDIAC CATHETERIZATION N/A 05/11/2015   Procedure: Coronary Stent Intervention;  Surgeon: Peter M Martinique, MD;  Location: [facility];  Service: Cardiovascular; PCI to Prox-Mid LAD with Overlapping Promus Premier DES 3.0 x 38 & 3.0 x 16 (post-dilated to 3.5 mm)     FLEXIBLE SIGMOIDOSCOPY N/A 05/18/2015   Procedure: FLEXIBLE SIGMOIDOSCOPY;  Surgeon: Clarene Essex, MD;  Location: [facility];  Service: Endoscopy;  Laterality: N/A;   NM MYOVIEW LTD   08/2016   EF 30% with large anterior-anteroseptal and apical infarct consistent with LAD infarct. His HIGH RISK because of large infarct. No ischemia.   S/P Appendectomy     Age 76   S/P Inguinal Hernia Repair     In his 20's.   TRANSCAROTID ARTERY REVASCULARIZATION  Left 04/30/2022   Procedure: Transcarotid Artery Revascularization;  Surgeon: Angelia Mould, MD;  [facility];  Service: Vascular;  Laterality: Left;   TRANSTHORACIC ECHOCARDIOGRAM  05/15/2015   Mild LVH, EF 35-40% - Mod HK of mid-apical anteroseptal, anterior & apical walls.  Gr 1 DD. Mild bilateral Atrial Enlargement.   TRANSTHORACIC ECHOCARDIOGRAM  12/2015   EF up to ~40-45% wiht Anterior-Anteroapical HK.  Mod LV dilation with increased LVEDP.   TRANSTHORACIC ECHOCARDIOGRAM  12/'17; 4/'18   a) EF ~30-35% (in setting of HTN Urgency). - Gr 2 DD;; b) EF 30-35% (per Dr. Sallyanne Kuster - 35-40%). Anterior-anteroapical Akinesis. Gr2 DD w/ high filling pressures. Mild LA dilation. Mild RV dilation.   ULTRASOUND GUIDANCE FOR VASCULAR ACCESS Right 04/30/2022   Procedure: ULTRASOUND GUIDANCE FOR VASCULAR ACCESS;  Surgeon: Angelia Mould, MD;  Location: [facility];  Service: Vascular;  Laterality: Right;   Patient Active Problem List   Diagnosis Date Noted   Acute blood loss anemia 05/08/2022   Cellulitis--right forearm 05/08/2022   Malnutrition of moderate degree 05/04/2022   Acute ischemic left middle cerebral artery (MCA) stroke ([place]) 05/01/2022   Hypokalemia 04/25/2022   Acute respiratory failure with hypoxia and hypercapnia (H[facility])    Acute CVA (cerebrovascular accident) ([place]) 04/21/2022   Combined systolic and diastolic heart failure ([place]) 04/21/2022   Abnormal bowel movement 10/23/2021   Hearing loss 10/23/2021   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   Personal history of colonic polyps 10/23/2021   Personal history of other diseases of the digestive system 10/23/2021   Psoriasis 10/23/2021    Type 2 diabetes mellitus with other specified complication ([place]) 16/05/9603   Abnormal abdominal CT scan 12/26/2019   Claudication ([place]) 10/24/2019   Trochanteric bursitis of right hip 12/22/2018   CKD (chronic kidney disease), stage III ([place]) 07/08/2016   Acute respiratory failure with hypoxia ([place]) 07/08/2016   GERD (gastroesophageal reflux disease) 07/08/2016   Coronary artery disease involving native coronary artery of native heart with angina pectoris ([place]) 07/05/2015   Depression 07/05/2015   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 ([place]) 05/18/2015   Right homonymous hemianopsia 05/18/2015   Gait disturbance, post-stroke 05/18/2015   ST elevation myocardial infarction (STEMI) of anterior wall, subsequent episode of care ([place]) 05/15/2015   Lower GI bleed 05/15/2015   Carotid arterial disease ([place]) 05/11/2015   Cardiomyopathy, ischemic: EF most recently 35-40%) 05/11/2015   CAD-native artery 05/11/2015    Class: Status post   Presence of drug coated stent in LAD coronary artery 05/08/2015   Prediabetes 05/06/2015   Essential hypertension 07/05/2014   Hyperlipidemia with  target LDL less than 70 07/05/2014   Former heavy cigarette smoker (20-39 per day) 07/05/2014    ONSET DATE: 04/21/22   REFERRING DIAG: V94.801 (ICD-10-CM) - Cerebral infarction due to unspecified occlusion or stenosis of left middle cerebral artery   THERAPY DIAG:  Aphasia  Cognitive communication deficit  Rationale for Evaluation and Treatment Rehabilitation  SUBJECTIVE:   SUBJECTIVE STATEMENT: "I'm definitely getting on"  PAIN:  Are you having pain? No  OBJECTIVE:  PATIENT REPORTED OUTCOME MEASURES (PROM): Multifactorial memory Questionnaire: needs ID related to verbal language. Sometimes: difficulty with word finding, forgetting what meant to say in conversation,    TODAY'S TREATMENT:  06-10-22: SLP facilitated completion of patient reported outcome measure to determine  functional needs related to memory. See above. Pt occasionally skipping lines, SLP advised on strategy of limiting visual field to aid in attention. SLP introduced Chartered loss adjuster (SFA) to support intentional circumlocution and rehabilitate lexical retrieval. SLP provided direct instruction and model with target word "apple." Following, pt able to generate 5/6 features for calendar, 6/6 features for toothbrush, given usual mod-A. SLP provides education on purpose of exercise until pt able to verbalize understanding. Update HEP  06/03/22: SLP completed CLQT, standardized cognitive assessment. Pt with increased attention demonstrated throughout session from baseline at eval (pt reportedly zoning out during initial visit), appears to maintain attention to therapist and converation throughout. Requesting repetitions frequently, likely 2/2 attention and hearing impairment. Spouse reports pt is now able to navigate to and open preferred game at home without her A.  CLQT: Attention: Severe, Memory: Moderate, Executive Function: Moderate, Language: Moderate, Visuospatial Skills: Moderate, and Clock Drawing: Severe  Overall: Moderate severity  Comments: SLP required to speak slowly and loudly. Pt requesting frequent repetitions. During story re-tell, says "I wasn't paying attention," when questioned, says this happens frequently in conversations. X1 instance of emergent awareness during design generation, however, then pt perseverates on single design without awareness. Attempts maze from both directions without success.   05/29/22: SLP educated pt and wife about some of pt's deficit areas (attention, awareness). SLP provided wife with some ideas of tasks pt could accomplish at home to target attention. SLP suggested pt practice writing out checks prior to writing out two checks with wife supervising. Mateo Flow told SLP she would like to return to work the end of November and asked SLP if he thought that was  going to be possible. SLP told Mateo Flow that may be too soon but we would need to see how pt progresses.  When told that SLP schedule was limited mostly to some 3:30 pm slots for the next 4 weeks pt wife inquired if there were other sites that offered ST. Mateo Flow was pleased to hear about [facility], and SLP offered to have her receive ST services there if availability allowed.    PATIENT EDUCATION: Education details: see "today's treatment" Person educated: Patient and Spouse Education method: Explanation and Demonstration Education comprehension: verbalized understanding     GOALS: Goals reviewed with patient? No To be done at first therapy session  SHORT TERM GOALS: Target date: 06/26/2022    Pt will complete CLQT and goals added PRN Baseline: Goal status: MET  2.  Pt will complete receptive and expressive language assessment, and PROM PRN Baseline:  Goal status: INITIAL  3.  Pt will attend to functional language tasks for 5 minutes, x5/session Baseline:  Goal status: INITIAL  4.  Pt will indicate awareness of 2 cognitive deficits in 3 sessions Baseline:  Goal  status: INITIAL  5.  Pt will write x3 checks with 100% accuracy with use of compensations PRN Baseline:  Goal status: INITIAL   LONG TERM GOALS: Target date: 08/21/2022    Pt will complete receptive and expressive language tasks with 100% accuracy given compensations in 3 sessions Baseline:  Goal status: INITIAL  2.  With written cues, pt will accurately complete sequence to play 1-2 games on his computer between 3 sessions Baseline:  Goal status: INITIAL  3.  Pt will describe 3 compensations he uses/ has used to improve memory in 3 sessions Baseline:  Goal status: INITIAL  4.  Pt will attend to structured task for 10 minutes x2/session with no more than 1 redirection required per trial Baseline:  Goal status: INITIAL  5.  Pt and spouse will report initiation and completion of x2 home based tasks over 1  week period with mod-I Baseline:  Goal status: INITIAL   ASSESSMENT:  CLINICAL IMPRESSION: Patient is a 76 y.o. male who was seen today for initiation of cognitive communication testing. Wife reports "He gets confused all the time" when asked what she thinks is his most significant deficit post-CVA and provided examples of inability to work the [employer] remote and to sequence steps to play games on his computer. He is not managing/tracking appointments, nor is he paying bills - which he did prior to CVA.    OBJECTIVE IMPAIRMENTS include attention, memory, awareness, executive functioning, expressive language, and receptive language. These impairments are limiting patient from managing appointments, managing finances, household responsibilities, ADLs/IADLs, and effectively communicating at home and in community. Factors affecting potential to achieve goals and functional outcome are ability to learn/carryover information, co-morbidities, and severity of impairments.. Patient will benefit from skilled SLP services to address above impairments and improve overall function.  REHAB POTENTIAL: Good  PLAN: SLP FREQUENCY: 2x/week  SLP DURATION: 12 weeks  PLANNED INTERVENTIONS: Language facilitation, Environmental controls, Cueing hierachy, Cognitive reorganization, Internal/external aids, Functional tasks, Multimodal communication approach, SLP instruction and feedback, Compensatory strategies, and Patient/family education    Su Monks, CCC-SLP 06/10/2022, 8:45 AM

## 2022-06-10 NOTE — Patient Instructions (Signed)
Item Category Action  Function/Purpose Physical Description  Location Related items

## 2022-06-12 ENCOUNTER — Encounter: Payer: Self-pay | Admitting: Speech Pathology

## 2022-06-12 ENCOUNTER — Ambulatory Visit: Payer: Medicare Other | Admitting: Speech Pathology

## 2022-06-12 DIAGNOSIS — R4701 Aphasia: Secondary | ICD-10-CM | POA: Diagnosis not present

## 2022-06-12 DIAGNOSIS — R41841 Cognitive communication deficit: Secondary | ICD-10-CM | POA: Diagnosis not present

## 2022-06-12 NOTE — Therapy (Signed)
[facility] OUTPATIENT SPEECH LANGUAGE PATHOLOGY TREATMENT   Patient Name: Damon Walker MRN: [id number] DOB:1946/05/07, 76 y.o., male Today's Date: 06/10/2022  PCP: [name], MD  REFERRING PROVIDER: Jennye Boroughs, MD    End of Session - 06/10/22 [phone]     Visit Number 3    Number of Visits 25    Date for SLP Re-Evaluation 08/27/22    SLP Start Time 0845    SLP Stop Time  0930    SLP Time Calculation (min) 45 min    Activity Tolerance Patient limited by lethargy;Patient tolerated treatment well              Past Medical History:  Diagnosis Date   Carotid arterial disease ([place]) 06/2014; 01/09/2015   Followed by Dr. Donnetta Hutching of [facility] -- a) Carotid u/s: 40-59% bilat ICA stenosis; b) 10/2019: Carotid Dopplers October 19, 1273: R ICA 1-70%, LICA 01-74%.  Right vertebral artery appears occluded.  Normal subclavian flow.  Normal left vertebral artery. 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  MRI Brain: Acute L Occipital Lobe Infarct. Chronic microvascular ischemic changes in the white matter and left pons.  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   Chronic combined systolic and diastolic congestive heart failure, NYHA class 2 ([place]) 05/11/2015 - 05/15/15   a. EF 20-25% with anterior-anteroseptal akinesis (immediately post anterior STEMI).  ; b. 10/10/'16 Echo: EF 35-40% with moderate mid-apical anteroseptal, anterior and apical hypokinesis.   Coronary artery disease involving native heart 05/11/2015   Cath: 3V CAD with 90% prox RCA, 80% mid RCA, 80% OM3, (RCA and OM residual treated medically) 100% LAD - PCI to Prox-Mid LAD with Overlapping Promus Premier DES 3.0 x 38 & 3.0 x 16 (post-dilated to 3.5 mm)    Essential hypertension    H/O: GI bleed  05/15/2015   s/p Sigmoid Polypectomy 05/04/2015 - prior to STEMI on May 11, 2015; Had GI Bleed following TPA for CVA, while on ASA & Brilinta; Flex Sig - 1. Internal Hemorrhoids, 2. Distal sigmoid polypectomy site with flat [place] status post biopsy and tattoo with 96m spot over 3 injections 3. Proximal sigmoid polypectomy site seen as well, 4) otherwise normal with no evidence of bleed   Hyperlipidemia with target LDL less than 70    Ischemic cardiomyopathy 05/11/2015   EF 40% on cath 05/11/2015 after anterior STEMI, EF 20-25% on echo 05/13/2015; 12/2015 =  EF ~45%.   Liver disease, chronic, with cirrhosis ([place] 11/2019   Mild possible early cirrhotic liver disease noted on CT scan.   Prediabetes Oct 2016   A1C 6.0 in Oct 2016   ST elevation myocardial infarction (STEMI) involving left anterior descending (LAD) coronary artery with complication ([place] 194/11/9673  100% Prox LAD - PCI with overlapping DES x 2   Tobacco abuse    a. 30 yrs - 1.5 ppd. - Quit 05/11/2015   Type 2 diabetes mellitus with other specified complication ([facility] [facility] 39/16/3846  Vision abnormalities    Right hemianopsia   Past Surgical History:  Procedure Laterality Date   CARDIAC CATHETERIZATION N/A 05/11/2015   Procedure: Left Heart Cath and Coronary Angiography;  Surgeon: Peter M JMartinique MD;  [facility];  Service: Cardiovascular;  100% pLAD (long lesion). RCA - prox 90%,  mid 80%. OM2 & OM3 80% (OM2 & 3 not necessarily PCI targets); EF 35-45% with Anterio HK   CARDIAC CATHETERIZATION N/A 05/11/2015   Procedure: Coronary Stent Intervention;  Surgeon: Peter M Martinique, MD;  Location: [facility];  Service: Cardiovascular; PCI to Prox-Mid LAD with Overlapping Promus Premier DES 3.0 x 38 & 3.0 x 16 (post-dilated to 3.5 mm)     FLEXIBLE SIGMOIDOSCOPY N/A 05/18/2015   Procedure: FLEXIBLE SIGMOIDOSCOPY;  Surgeon: Clarene Essex, MD;  Location: [facility];  Service: Endoscopy;  Laterality: N/A;   NM MYOVIEW LTD   08/2016   EF 30% with large anterior-anteroseptal and apical infarct consistent with LAD infarct. His HIGH RISK because of large infarct. No ischemia.   S/P Appendectomy     Age 64   S/P Inguinal Hernia Repair     In his 20's.   TRANSCAROTID ARTERY REVASCULARIZATION  Left 04/30/2022   Procedure: Transcarotid Artery Revascularization;  Surgeon: Angelia Mould, MD;  Location: [facility];  Service: Vascular;  Laterality: Left;   TRANSTHORACIC ECHOCARDIOGRAM  05/15/2015   Mild LVH, EF 35-40% - Mod HK of mid-apical anteroseptal, anterior & apical walls.  Gr 1 DD. Mild bilateral Atrial Enlargement.   TRANSTHORACIC ECHOCARDIOGRAM  12/2015   EF up to ~40-45% wiht Anterior-Anteroapical HK.  Mod LV dilation with increased LVEDP.   TRANSTHORACIC ECHOCARDIOGRAM  12/'17; 4/'18   a) EF ~30-35% (in setting of HTN Urgency). - Gr 2 DD;; b) EF 30-35% (per Dr. Sallyanne Kuster - 35-40%). Anterior-anteroapical Akinesis. Gr2 DD w/ high filling pressures. Mild LA dilation. Mild RV dilation.   ULTRASOUND GUIDANCE FOR VASCULAR ACCESS Right 04/30/2022   Procedure: ULTRASOUND GUIDANCE FOR VASCULAR ACCESS;  Surgeon: Angelia Mould, MD;  Location: [facility];  Service: Vascular;  Laterality: Right;   Patient Active Problem List   Diagnosis Date Noted   Acute blood loss anemia 05/08/2022   Cellulitis--right forearm 05/08/2022   Malnutrition of moderate degree 05/04/2022   Acute ischemic left middle cerebral artery (MCA) stroke ([place]) 05/01/2022   Hypokalemia 04/25/2022   Acute respiratory failure with hypoxia and hypercapnia (H[facility])    Acute CVA (cerebrovascular accident) ([place]) 04/21/2022   Combined systolic and diastolic heart failure ([place]) 04/21/2022   Abnormal bowel movement 10/23/2021   Hearing loss 10/23/2021   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   Personal history of colonic polyps 10/23/2021   Personal history of other diseases of the digestive system 10/23/2021   Psoriasis 10/23/2021    Type 2 diabetes mellitus with other specified complication ([place]) 33/54/5625   Abnormal abdominal CT scan 12/26/2019   Claudication ([place]) 10/24/2019   Trochanteric bursitis of right hip 12/22/2018   CKD (chronic kidney disease), stage III ([place]) 07/08/2016   Acute respiratory failure with hypoxia ([place]) 07/08/2016   GERD (gastroesophageal reflux disease) 07/08/2016   Coronary artery disease involving native coronary artery of native heart with angina pectoris ([place]) 07/05/2015   Depression 07/05/2015   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 ([place]) 05/18/2015   Right homonymous hemianopsia 05/18/2015   Gait disturbance, post-stroke 05/18/2015   ST elevation myocardial infarction (STEMI) of anterior wall, subsequent episode of care ([place]) 05/15/2015   Lower GI bleed 05/15/2015   Carotid arterial disease ([place]) 05/11/2015   Cardiomyopathy, ischemic: EF most recently 35-40%) 05/11/2015   CAD-native artery 05/11/2015    Class: Status post   Presence of drug coated stent in LAD coronary artery 05/08/2015   Prediabetes 05/06/2015   Essential hypertension 07/05/2014   Hyperlipidemia with  target LDL less than 70 07/05/2014   Former heavy cigarette smoker (20-39 per day) 07/05/2014    ONSET DATE: 04/21/22   REFERRING DIAG: T88.828 (ICD-10-CM) - Cerebral infarction due to unspecified occlusion or stenosis of left middle cerebral artery   THERAPY DIAG:  Aphasia  Cognitive communication deficit  Rationale for Evaluation and Treatment Rehabilitation  SUBJECTIVE:   SUBJECTIVE STATEMENT: "He enjoyed the homework"  PAIN:  Are you having pain? No  OBJECTIVE:  PATIENT REPORTED OUTCOME MEASURES (PROM): Multifactorial memory Questionnaire: needs ID related to verbal language. Sometimes: difficulty with word finding, forgetting what meant to say in conversation,    TODAY'S TREATMENT:   06-12-22:  To target attention, auditory comprehension, word finding and sentence  generatoin  Pharmacist, hospital ([facility] was utilized. The pt generated 3 subjects and objects for 3 verbs (deliver, measure, drive) for a total of 9 subject objects. Pt required usual mod semantic and questioning cues. Pt generated 3 complex sentences by answering "wh" questions. Pt required usual repetition, written and semantic cues to generate complex sentences. He required mod A to ID 5 paraphasias. Targeted sequencing, organization and word finding explaining how to play [place]. 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 He has returned to playing daily with is daughter who lives out of town. With questioning cues, [name] Id'd cognitive impairments of memory, attention, comprehension and verbal expression.    06-10-22: SLP facilitated completion of patient reported outcome measure to determine functional needs related to memory. See above. Pt occasionally skipping lines, SLP advised on strategy of limiting visual field to aid in attention. SLP introduced Chartered loss adjuster (SFA) to support intentional circumlocution and rehabilitate lexical retrieval. SLP provided direct instruction and model with target word "apple." Following, pt able to generate 5/6 features for calendar, 6/6 features for toothbrush, given usual mod-A. SLP provides education on purpose of exercise until pt able to verbalize understanding. Update HEP  06/03/22: SLP completed CLQT, standardized cognitive assessment. Pt with increased attention demonstrated throughout session from baseline at eval (pt reportedly zoning out during initial visit), appears to maintain attention to therapist and converation throughout. Requesting repetitions frequently, likely 2/2 attention and hearing impairment. Spouse reports pt is now able to navigate to and open preferred game at home without her A.  CLQT: Attention: Severe, Memory: Moderate, Executive  Function: Moderate, Language: Moderate, Visuospatial Skills: Moderate, and Clock Drawing: Severe  Overall: Moderate severity  Comments: SLP required to speak slowly and loudly. Pt requesting frequent repetitions. During story re-tell, says "I wasn't paying attention," when questioned, says this happens frequently in conversations. X1 instance of emergent awareness during design generation, however, then pt perseverates on single design without awareness. Attempts maze from both directions without success.   05/29/22: SLP educated pt and wife about some of pt's deficit areas (attention, awareness). SLP provided wife with some ideas of tasks pt could accomplish at home to target attention. SLP suggested pt practice writing out checks prior to writing out two checks with wife supervising. Mateo Flow told SLP she would like to return to work the end of November and asked SLP if he thought that was going to be possible. SLP told Mateo Flow that may be too soon but we would need to see how pt progresses.  When told that SLP schedule was limited mostly to some 3:30 pm slots for the next 4 weeks pt wife inquired if there were other sites that offered  ST. Mateo Flow was pleased to hear about [facility], and SLP offered to have her receive ST services there if availability allowed.    PATIENT EDUCATION: Education details: see "today's treatment" Person educated: Patient and Spouse Education method: Explanation and Demonstration Education comprehension: verbalized understanding     GOALS: Goals reviewed with patient? No To be done at first therapy session  SHORT TERM GOALS: Target date: 06/26/2022    Pt will complete CLQT and goals added PRN Baseline: Goal status: MET  2.  Pt will complete receptive and expressive language assessment, and PROM PRN Baseline:  Goal status: INITIAL  3.  Pt will attend to functional language tasks for 5 minutes, x5/session Baseline:  Goal status: INITIAL  4.  Pt will  indicate awareness of 2 cognitive deficits in 3 sessions Baseline:  Goal status: INITIAL  5.  Pt will write x3 checks with 100% accuracy with use of compensations PRN Baseline:  Goal status: INITIAL   LONG TERM GOALS: Target date: 08/21/2022    Pt will complete receptive and expressive language tasks with 100% accuracy given compensations in 3 sessions Baseline:  Goal status: INITIAL  2.  With written cues, pt will accurately complete sequence to play 1-2 games on his computer between 3 sessions Baseline:  Goal status: INITIAL  3.  Pt will describe 3 compensations he uses/ has used to improve memory in 3 sessions Baseline:  Goal status: INITIAL  4.  Pt will attend to structured task for 10 minutes x2/session with no more than 1 redirection required per trial Baseline:  Goal status: INITIAL  5.  Pt and spouse will report initiation and completion of x2 home based tasks over 1 week period with mod-I Baseline:  Goal status: INITIAL   ASSESSMENT:  CLINICAL IMPRESSION: Patient is a 76 y.o. male who was seen today for initiation of cognitive communication testing. Wife reports "He gets confused all the time" when asked what she thinks is his most significant deficit post-CVA and provided examples of inability to work the [employer] remote and to sequence steps to play games on his computer. He is not managing/tracking appointments, nor is he paying bills - which he did prior to CVA.    OBJECTIVE IMPAIRMENTS include attention, memory, awareness, executive functioning, expressive language, and receptive language. These impairments are limiting patient from managing appointments, managing finances, household responsibilities, ADLs/IADLs, and effectively communicating at home and in community. Factors affecting potential to achieve goals and functional outcome are ability to learn/carryover information, co-morbidities, and severity of impairments.. Patient will benefit from skilled SLP  services to address above impairments and improve overall function.  REHAB POTENTIAL: Good  PLAN: SLP FREQUENCY: 2x/week  SLP DURATION: 12 weeks  PLANNED INTERVENTIONS: Language facilitation, Environmental controls, Cueing hierachy, Cognitive reorganization, Internal/external aids, Functional tasks, Multimodal communication approach, SLP instruction and feedback, Compensatory strategies, and Patient/family education    Su Monks, CCC-SLP 06/10/2022, 8:45 AM

## 2022-06-13 ENCOUNTER — Ambulatory Visit (HOSPITAL_COMMUNITY)
Admission: RE | Admit: 2022-06-13 | Discharge: 2022-06-13 | Disposition: A | Discharge status: Home / Self Care | Payer: Medicare Other | Source: Ambulatory Visit | Attending: Vascular Surgery | Admitting: Vascular Surgery

## 2022-06-13 ENCOUNTER — Encounter: Payer: Self-pay | Admitting: Vascular Surgery

## 2022-06-13 ENCOUNTER — Ambulatory Visit (INDEPENDENT_AMBULATORY_CARE_PROVIDER_SITE_OTHER): Payer: Medicare Other | Admitting: Vascular Surgery

## 2022-06-13 VITALS — BP 132/76 | HR 60 | Temp 97.9°F | Resp 20 | Ht 67.0 in | Wt 184.0 lb

## 2022-06-13 DIAGNOSIS — I6522 Occlusion and stenosis of left carotid artery: Secondary | ICD-10-CM

## 2022-06-13 DIAGNOSIS — I6523 Occlusion and stenosis of bilateral carotid arteries: Secondary | ICD-10-CM | POA: Diagnosis not present

## 2022-06-13 NOTE — Progress Notes (Signed)
Patient name: Damon Walker MRN: [id number] DOB: 1945/10/28 Sex: male  REASON FOR VISIT:   Follow-up after TCAR.  HPI:   Florian Chauca is a pleasant 76 y.o. male who had presented with a left parietal stroke.  He had a 60% left carotid stenosis that was fairly high.  He had multiple medical comorbidities.  I recommended left TCAR.  On 04/30/2022 he had a left TCAR with a 9 x 40 stent.  He did well postoperatively and was discharged on postoperative day #2.  He was discharged on aspirin, Plavix, and a statin.  Since I saw him last he has continued to make excellent improvement.  He has some expressive aphasia but no focal weakness or paresthesias.  He was on Plavix prior to his surgery so we will have him continue that.  Current Outpatient Medications  Medication Sig Dispense Refill   acetaminophen (TYLENOL) 325 MG tablet Take 1-2 tablets (325-650 mg total) by mouth every 4 (four) hours as needed for mild pain.     aspirin EC 81 MG tablet Take 1 tablet (81 mg total) by mouth daily. 30 tablet 12   carvedilol (COREG) 6.25 MG tablet Take 1 tablet (6.25 mg total) by mouth 2 (two) times daily with a meal. 60 tablet 0   clopidogrel (PLAVIX) 75 MG tablet Take 1 tablet (75 mg total) by mouth daily. 30 tablet 0   famotidine (PEPCID) 40 MG tablet Take 1 tablet (40 mg total) by mouth every other day. 15 tablet 0   ferrous sulfate 325 (65 FE) MG tablet Take 1 tablet (325 mg total) by mouth daily with breakfast. 30 tablet 0   furosemide (LASIX) 20 MG tablet Take 20 mg by mouth as needed for fluid or edema (weight gain of 3 lbs in a day and 5 lbs in a week). Take 1 tablet as needed for weight gain of 3 lbs in a day and 5 lbs in a week or swelling     losartan (COZAAR) 25 MG tablet Take 25 mg by mouth as needed (SBP > 160). Take 1 tablet as needed for systolic bp greater than 419     nitroGLYCERIN (NITROSTAT) 0.4 MG SL tablet Place 1 tablet (0.4 mg total) under the tongue every 5 (five) minutes x 3  doses as needed for chest pain. 25 tablet 3   pantoprazole (PROTONIX) 20 MG tablet Take 1 tablet (20 mg total) by mouth every other day. 15 tablet 0   rosuvastatin (CRESTOR) 40 MG tablet Take 1 tablet (40 mg total) by mouth daily at 6 PM. 30 tablet 0   sertraline (ZOLOFT) 25 MG tablet Take 0.5 tablets (12.5 mg total) by mouth at bedtime. 15 tablet 0   traZODone (DESYREL) 50 MG tablet Take 1 tablet (50 mg total) by mouth at bedtime. 30 tablet 0   No current facility-administered medications for this visit.    REVIEW OF SYSTEMS:  '[X]'$  denotes positive finding, '[ ]'$  denotes negative finding Vascular    Leg swelling    Cardiac    Chest pain or chest pressure:    Shortness of breath upon exertion:    Short of breath when lying flat:    Irregular heart rhythm:    Constitutional    Fever or chills:     PHYSICAL EXAM:   Vitals:   06/13/22 1055 06/13/22 1058  BP: 122/65 132/76  Pulse: 60   Resp: 20   Temp: 97.9 F (36.6 C)   SpO2: 98%   Weight:  184 lb (83.5 kg)   Height: '5\' 7"'$  (1.702 m)     GENERAL: The patient is a well-nourished male, in no acute distress. The vital signs are documented above. CARDIOVASCULAR: There is a regular rate and rhythm. PULMONARY: There is good air exchange bilaterally without wheezing or rales. VASCULAR: The left neck incision is healing nicely. I do not detect carotid bruits. NEURO: He has no focal weakness or paresthesias.  DATA:   CAROTID DUPLEX: I have independently interpreted his carotid duplex scan today.  On the right side there is no significant carotid stenosis.  The right vertebral artery could not be identified.  On the left side, the left carotid stent is widely patent.  The left vertebral artery is patent with antegrade flow.  MEDICAL ISSUES:   S/P LEFT TCAR: The patient is doing well status post left TCAR for asymptomatic left carotid stenosis.  He is on aspirin, Plavix, and a statin.  He is not a smoker.  He tells me that he was on  Plavix prior to this event so we will have him continue that.  I have ordered a follow-up duplex scan in 1 year and he can be seen on the PA schedule at that time.  Of note he has no significant carotid stenosis on the right.  Deitra Mayo [facility] [phone]

## 2022-06-17 ENCOUNTER — Ambulatory Visit: Payer: Medicare Other | Admitting: Speech Pathology

## 2022-06-17 DIAGNOSIS — R4701 Aphasia: Secondary | ICD-10-CM

## 2022-06-17 DIAGNOSIS — R41841 Cognitive communication deficit: Secondary | ICD-10-CM

## 2022-06-17 NOTE — Therapy (Signed)
[facility] OUTPATIENT SPEECH LANGUAGE PATHOLOGY TREATMENT   Patient Name: Damon Walker MRN: [id number] DOB:04-17-1946, 76 y.o., male Today's Date: 06/10/2022  PCP: [name], MD  REFERRING PROVIDER: Jennye Boroughs, MD    End of Session - 06/17/22 0845     Visit Number 5    Number of Visits 25    Date for SLP Re-Evaluation 08/27/22    SLP Start Time 0845    SLP Stop Time  0930    SLP Time Calculation (min) 45 min    Activity Tolerance Patient limited by lethargy;Patient tolerated treatment well             Past Medical History:  Diagnosis Date   Carotid arterial disease ([place]) 06/2014; 01/09/2015   Followed by Dr. Donnetta Hutching of [facility] -- a) Carotid u/s: 40-59% bilat ICA stenosis; b) 10/2019: Carotid Dopplers October 19, 3298: R ICA 7-62%, LICA 26-33%.  Right vertebral artery appears occluded.  Normal subclavian flow.  Normal left vertebral artery. 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  MRI Brain: Acute L Occipital Lobe Infarct. Chronic microvascular ischemic changes in the white matter and left pons.  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   Chronic combined systolic and diastolic congestive heart failure, NYHA class 2 ([place]) 05/11/2015 - 05/15/15   a. EF 20-25% with anterior-anteroseptal akinesis (immediately post anterior STEMI).  ; b. 10/10/'16 Echo: EF 35-40% with moderate mid-apical anteroseptal, anterior and apical hypokinesis.   Coronary artery disease involving native heart 05/11/2015   Cath: 3V CAD with 90% prox RCA, 80% mid RCA, 80% OM3, (RCA and OM residual treated medically) 100% LAD - PCI to Prox-Mid LAD with Overlapping Promus Premier DES 3.0 x 38 & 3.0 x 16 (post-dilated to 3.5 mm)    Essential hypertension    H/O: GI bleed  05/15/2015   s/p Sigmoid Polypectomy 05/04/2015 - prior to STEMI on May 11, 2015; Had GI Bleed following TPA for CVA, while on ASA & Brilinta; Flex Sig - 1. Internal Hemorrhoids, 2. Distal sigmoid polypectomy site with flat [place] status post biopsy and tattoo with 71m spot over 3 injections 3. Proximal sigmoid polypectomy site seen as well, 4) otherwise normal with no evidence of bleed   Hyperlipidemia with target LDL less than 70    Ischemic cardiomyopathy 05/11/2015   EF 40% on cath 05/11/2015 after anterior STEMI, EF 20-25% on echo 05/13/2015; 12/2015 =  EF ~45%.   Liver disease, chronic, with cirrhosis ([place] 11/2019   Mild possible early cirrhotic liver disease noted on CT scan.   Prediabetes Oct 2016   A1C 6.0 in Oct 2016   ST elevation myocardial infarction (STEMI) involving left anterior descending (LAD) coronary artery with complication ([place] 135/11/5623  100% Prox LAD - PCI with overlapping DES x 2   Tobacco abuse    a. 30 yrs - 1.5 ppd. - Quit 05/11/2015   Type 2 diabetes mellitus with other specified complication ([place] 36/38/9373  Vision abnormalities    Right hemianopsia   Past Surgical History:  Procedure Laterality Date   CARDIAC CATHETERIZATION N/A 05/11/2015   Procedure: Left Heart Cath and Coronary Angiography;  Surgeon: Peter M JMartinique MD;  [facility];  Service: Cardiovascular;  100% pLAD (long lesion). RCA - prox 90%, mid  80%. OM2 & OM3 80% (OM2 & 3 not necessarily PCI targets); EF 35-45% with Anterio HK   CARDIAC CATHETERIZATION N/A 05/11/2015   Procedure: Coronary Stent Intervention;  Surgeon: Peter M Martinique, MD;  Location: [facility];  Service: Cardiovascular; PCI to Prox-Mid LAD with Overlapping Promus Premier DES 3.0 x 38 & 3.0 x 16 (post-dilated to 3.5 mm)     FLEXIBLE SIGMOIDOSCOPY N/A 05/18/2015   Procedure: FLEXIBLE SIGMOIDOSCOPY;  Surgeon: Clarene Essex, MD;  Location: [facility];  Service: Endoscopy;  Laterality: N/A;   NM MYOVIEW LTD   08/2016   EF 30% with large anterior-anteroseptal and apical infarct consistent with LAD infarct. His HIGH RISK because of large infarct. No ischemia.   S/P Appendectomy     Age 36   S/P Inguinal Hernia Repair     In his 20's.   TRANSCAROTID ARTERY REVASCULARIZATION  Left 04/30/2022   Procedure: Transcarotid Artery Revascularization;  Surgeon: Angelia Mould, MD;  Location: [facility] OR;  Service: Vascular;  Laterality: Left;   TRANSTHORACIC ECHOCARDIOGRAM  05/15/2015   Mild LVH, EF 35-40% - Mod HK of mid-apical anteroseptal, anterior & apical walls.  Gr 1 DD. Mild bilateral Atrial Enlargement.   TRANSTHORACIC ECHOCARDIOGRAM  12/2015   EF up to ~40-45% wiht Anterior-Anteroapical HK.  Mod LV dilation with increased LVEDP.   TRANSTHORACIC ECHOCARDIOGRAM  12/'17; 4/'18   a) EF ~30-35% (in setting of HTN Urgency). - Gr 2 DD;; b) EF 30-35% (per Dr. Sallyanne Kuster - 35-40%). Anterior-anteroapical Akinesis. Gr2 DD w/ high filling pressures. Mild LA dilation. Mild RV dilation.   ULTRASOUND GUIDANCE FOR VASCULAR ACCESS Right 04/30/2022   Procedure: ULTRASOUND GUIDANCE FOR VASCULAR ACCESS;  Surgeon: Angelia Mould, MD;  Location: [facility];  Service: Vascular;  Laterality: Right;   Patient Active Problem List   Diagnosis Date Noted   Acute blood loss anemia 05/08/2022   Cellulitis--right forearm 05/08/2022   Malnutrition of moderate degree 05/04/2022   Acute ischemic left middle cerebral artery (MCA) stroke ([place]) 05/01/2022   Hypokalemia 04/25/2022   Acute respiratory failure with hypoxia and hypercapnia ([facility])    Acute CVA (cerebrovascular accident) ([place]) 04/21/2022   Combined systolic and diastolic heart failure ([place]) 04/21/2022   Abnormal bowel movement 10/23/2021   Hearing loss 10/23/2021   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   Personal history of colonic polyps 10/23/2021   Personal history of other diseases of the digestive system 10/23/2021   Psoriasis 10/23/2021    Type 2 diabetes mellitus with other specified complication ([place]) 32/20/2542   Abnormal abdominal CT scan 12/26/2019   Claudication ([place]) 10/24/2019   Trochanteric bursitis of right hip 12/22/2018   CKD (chronic kidney disease), stage III ([place]) 07/08/2016   Acute respiratory failure with hypoxia ([place]) 07/08/2016   GERD (gastroesophageal reflux disease) 07/08/2016   Coronary artery disease involving native coronary artery of native heart with angina pectoris ([place]) 07/05/2015   Depression 07/05/2015   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 ([place]) 05/18/2015   Right homonymous hemianopsia 05/18/2015   Gait disturbance, post-stroke 05/18/2015   ST elevation myocardial infarction (STEMI) of anterior wall, subsequent episode of care ([place]) 05/15/2015   Lower GI bleed 05/15/2015   Carotid arterial disease ([place]) 05/11/2015   Cardiomyopathy, ischemic: EF most recently 35-40%) 05/11/2015   CAD-native artery 05/11/2015    Class: Status post   Presence of drug coated stent in LAD coronary artery 05/08/2015   Prediabetes 05/06/2015   Essential hypertension 07/05/2014   Hyperlipidemia with target  LDL less than 70 07/05/2014   Former heavy cigarette smoker (20-39 per day) 07/05/2014    ONSET DATE: 04/21/22   REFERRING DIAG: H21.224 (ICD-10-CM) - Cerebral infarction due to unspecified occlusion or stenosis of left middle cerebral artery   THERAPY DIAG:  Aphasia  Cognitive communication deficit  Rationale for Evaluation and Treatment Rehabilitation  SUBJECTIVE:   SUBJECTIVE STATEMENT: "Great"  PAIN:  Are you having pain? No  OBJECTIVE:   TODAY'S TREATMENT:   06-17-22: SLP facilitated ongoing language and attention rehabilitation through [employer]	[place] ([facility]). The pt generated 3 subjects and 3 objects for 3 target verbs (play, ride, bake) with usual mod-max verbal cueing (questioning, semantic, phonemic). Able to answer wh- questions demonstrating  verbal reasoning and functional auditory comprehension in 80% of trials using generated sentences from [facility]. Throughout, incorporated generative naming based on verb with pt able to generate x7 across 3 trials with usual mod-A following initial x3.   06-12-22: To target attention, auditory comprehension, word finding and sentence generatoin  Pharmacist, hospital ([facility]) was utilized. The pt generated 3 subjects and objects for 3 verbs (deliver, measure, drive) for a total of 9 subject objects. Pt required usual mod semantic and questioning cues. Pt generated 3 complex sentences by answering "wh" questions. Pt required usual repetition, written and semantic cues to generate complex sentences. He required mod A to ID 5 paraphasias. Targeted sequencing, organization and word finding explaining how to play [place]. 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 He has returned to playing daily with is daughter who lives out of town. With questioning cues, [name] Id'd cognitive impairments of memory, attention, comprehension and verbal expression.    06-10-22: SLP facilitated completion of patient reported outcome measure to determine functional needs related to memory. See above. Pt occasionally skipping lines, SLP advised on strategy of limiting visual field to aid in attention. SLP introduced Chartered loss adjuster (SFA) to support intentional circumlocution and rehabilitate lexical retrieval. SLP provided direct instruction and model with target word "apple." Following, pt able to generate 5/6 features for calendar, 6/6 features for toothbrush, given usual mod-A. SLP provides education on purpose of exercise until pt able to verbalize understanding. Update HEP  06/03/22: SLP completed CLQT, standardized cognitive assessment. Pt with increased attention demonstrated throughout session from baseline at eval (pt reportedly  zoning out during initial visit), appears to maintain attention to therapist and converation throughout. Requesting repetitions frequently, likely 2/2 attention and hearing impairment. Spouse reports pt is now able to navigate to and open preferred game at home without her A.  CLQT: Attention: Severe, Memory: Moderate, Executive Function: Moderate, Language: Moderate, Visuospatial Skills: Moderate, and Clock Drawing: Severe  Overall: Moderate severity  Comments: SLP required to speak slowly and loudly. Pt requesting frequent repetitions. During story re-tell, says "I wasn't paying attention," when questioned, says this happens frequently in conversations. X1 instance of emergent awareness during design generation, however, then pt perseverates on single design without awareness. Attempts maze from both directions without success.   05/29/22: SLP educated pt and wife about some of pt's deficit areas (attention, awareness). SLP provided wife with some ideas of tasks pt could accomplish at home to target attention. SLP suggested pt practice writing out checks prior to writing out two checks with wife supervising. Mateo Flow told SLP she would like to return to work the end of November and asked SLP if he thought that was going  to be possible. SLP told Mateo Flow that may be too soon but we would need to see how pt progresses.  When told that SLP schedule was limited mostly to some 3:30 pm slots for the next 4 weeks pt wife inquired if there were other sites that offered ST. Mateo Flow was pleased to hear about [facility], and SLP offered to have her receive ST services there if availability allowed.    PATIENT EDUCATION: Education details: see "today's treatment" Person educated: Patient and Spouse Education method: Explanation and Demonstration Education comprehension: verbalized understanding     GOALS: Goals reviewed with patient? No To be done at first therapy session  SHORT TERM GOALS: Target date:  06/26/2022    Pt will complete CLQT and goals added PRN Baseline: Goal status: MET  2.  Pt will complete receptive and expressive language assessment, and PROM PRN Baseline:  Goal status: INITIAL  3.  Pt will attend to functional language tasks for 5 minutes, x5/session Baseline:  Goal status: INITIAL  4.  Pt will indicate awareness of 2 cognitive deficits in 3 sessions Baseline:  Goal status: INITIAL  5.  Pt will write x3 checks with 100% accuracy with use of compensations PRN Baseline:  Goal status: INITIAL   LONG TERM GOALS: Target date: 08/21/2022    Pt will complete receptive and expressive language tasks with 100% accuracy given compensations in 3 sessions Baseline:  Goal status: INITIAL  2.  With written cues, pt will accurately complete sequence to play 1-2 games on his computer between 3 sessions Baseline:  Goal status: INITIAL  3.  Pt will describe 3 compensations he uses/ has used to improve memory in 3 sessions Baseline:  Goal status: INITIAL  4.  Pt will attend to structured task for 10 minutes x2/session with no more than 1 redirection required per trial Baseline:  Goal status: INITIAL  5.  Pt and spouse will report initiation and completion of x2 home based tasks over 1 week period with mod-I Baseline:  Goal status: INITIAL   ASSESSMENT:  CLINICAL IMPRESSION: Patient is a 76 y.o. male who was seen today for initiation of cognitive communication testing. Wife reports "He gets confused all the time" when asked what she thinks is his most significant deficit post-CVA and provided examples of inability to work the [employer] remote and to sequence steps to play games on his computer. He is not managing/tracking appointments, nor is he paying bills - which he did prior to CVA.    OBJECTIVE IMPAIRMENTS include attention, memory, awareness, executive functioning, expressive language, and receptive language. These impairments are limiting patient from managing  appointments, managing finances, household responsibilities, ADLs/IADLs, and effectively communicating at home and in community. Factors affecting potential to achieve goals and functional outcome are ability to learn/carryover information, co-morbidities, and severity of impairments.. Patient will benefit from skilled SLP services to address above impairments and improve overall function.  REHAB POTENTIAL: Good  PLAN: SLP FREQUENCY: 2x/week  SLP DURATION: 12 weeks  PLANNED INTERVENTIONS: Language facilitation, Environmental controls, Cueing hierachy, Cognitive reorganization, Internal/external aids, Functional tasks, Multimodal communication approach, SLP instruction and feedback, Compensatory strategies, and Patient/family education    Su Monks, CCC-SLP 06/10/2022, 8:45 AM

## 2022-06-19 ENCOUNTER — Ambulatory Visit: Payer: Medicare Other | Admitting: Speech Pathology

## 2022-06-19 ENCOUNTER — Telehealth: Payer: Self-pay | Admitting: Physician Assistant

## 2022-06-19 DIAGNOSIS — R4701 Aphasia: Secondary | ICD-10-CM | POA: Diagnosis not present

## 2022-06-19 DIAGNOSIS — R41841 Cognitive communication deficit: Secondary | ICD-10-CM

## 2022-06-19 NOTE — Telephone Encounter (Signed)
Patient's wife is calling about his zio patch monitor.  He received a second one to wear.  She states the first one he wore it for 13 1/2 days and he broke out with blister under the patch, and they were weeping the blisters. She is scared to put the monitor back on again because the rash will appear again, since he has such sensitive skin, he has severe eczema.  She wants to know what she should do.

## 2022-06-19 NOTE — Telephone Encounter (Signed)
Spoke to wife (ok per DPR)-states patient work monitor almost the full 14 days.   He had open blisters from the monitor.   Advised to mail 2nd monitor back.  If additional data needed will need to order [employer]ice with sensitive skin patches.  Advised would review with Melvenia Needles PA  Also reports intermittent elevated BP readings:  Reports systolic BP readings:'142 139 129 122 114 142 141 144 134 118 '$  Advised to monitor sodium level closely and continue to montior BP.  If consistently >130/80 let us know.

## 2022-06-19 NOTE — Therapy (Signed)
[facility] OUTPATIENT SPEECH LANGUAGE PATHOLOGY TREATMENT   Patient Name: Damon Walker MRN: [id number] DOB:Dec 08, 1945, 76 y.o., male Today's Date: 06/10/2022  PCP: [name], MD  REFERRING PROVIDER: Jennye Boroughs, MD    End of Session - 06/19/22 0844     Visit Number 6    Number of Visits 25    Date for SLP Re-Evaluation 08/27/22    SLP Start Time 0845    SLP Stop Time  0930    SLP Time Calculation (min) 45 min    Activity Tolerance Patient tolerated treatment well             Past Medical History:  Diagnosis Date   Carotid arterial disease ([place]) 06/2014; 01/09/2015   Followed by Dr. Donnetta Hutching of [facility] -- a) Carotid u/s: 40-59% bilat ICA stenosis; b) 10/2019: Carotid Dopplers October 19, 298: R ICA 9-23%, LICA 30-07%.  Right vertebral artery appears occluded.  Normal subclavian flow.  Normal left vertebral artery. 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  MRI Brain: Acute L Occipital Lobe Infarct. Chronic microvascular ischemic changes in the white matter and left pons.  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   Chronic combined systolic and diastolic congestive heart failure, NYHA class 2 ([place]) 05/11/2015 - 05/15/15   a. EF 20-25% with anterior-anteroseptal akinesis (immediately post anterior STEMI).  ; b. 10/10/'16 Echo: EF 35-40% with moderate mid-apical anteroseptal, anterior and apical hypokinesis.   Coronary artery disease involving native heart 05/11/2015   Cath: 3V CAD with 90% prox RCA, 80% mid RCA, 80% OM3, (RCA and OM residual treated medically) 100% LAD - PCI to Prox-Mid LAD with Overlapping Promus Premier DES 3.0 x 38 & 3.0 x 16 (post-dilated to 3.5 mm)    Essential hypertension    H/O: GI bleed 05/15/2015   s/p Sigmoid Polypectomy  05/04/2015 - prior to STEMI on May 11, 2015; Had GI Bleed following TPA for CVA, while on ASA & Brilinta; Flex Sig - 1. Internal Hemorrhoids, 2. Distal sigmoid polypectomy site with flat [place] status post biopsy and tattoo with 59m spot over 3 injections 3. Proximal sigmoid polypectomy site seen as well, 4) otherwise normal with no evidence of bleed   Hyperlipidemia with target LDL less than 70    Ischemic cardiomyopathy 05/11/2015   EF 40% on cath 05/11/2015 after anterior STEMI, EF 20-25% on echo 05/13/2015; 12/2015 =  EF ~45%.   Liver disease, chronic, with cirrhosis ([facility] 11/2019   Mild possible early cirrhotic liver disease noted on CT scan.   Prediabetes Oct 2016   A1C 6.0 in Oct 2016   ST elevation myocardial infarction (STEMI) involving left anterior descending (LAD) coronary artery with complication ([facility] 162/09/6331  100% Prox LAD - PCI with overlapping DES x 2   Tobacco abuse    a. 30 yrs - 1.5 ppd. - Quit 05/11/2015   Type 2 diabetes mellitus with other specified complication ([facility] 35/45/6256  Vision abnormalities    Right hemianopsia   Past Surgical History:  Procedure Laterality Date   CARDIAC CATHETERIZATION N/A 05/11/2015   Procedure: Left Heart Cath and Coronary Angiography;  Surgeon: Peter M JMartinique MD;  [facility];  Service: Cardiovascular;  100% pLAD (long lesion). RCA - prox 90%, mid 80%. OM2 &  OM3 80% (OM2 & 3 not necessarily PCI targets); EF 35-45% with Anterio HK   CARDIAC CATHETERIZATION N/A 05/11/2015   Procedure: Coronary Stent Intervention;  Surgeon: Peter M Martinique, MD;  Location: [facility];  Service: Cardiovascular; PCI to Prox-Mid LAD with Overlapping Promus Premier DES 3.0 x 38 & 3.0 x 16 (post-dilated to 3.5 mm)     FLEXIBLE SIGMOIDOSCOPY N/A 05/18/2015   Procedure: FLEXIBLE SIGMOIDOSCOPY;  Surgeon: Clarene Essex, MD;  Location: [place] [facility];  Service: Endoscopy;  Laterality: N/A;   NM MYOVIEW LTD  08/2016   EF 30% with large  anterior-anteroseptal and apical infarct consistent with LAD infarct. His HIGH RISK because of large infarct. No ischemia.   S/P Appendectomy     Age 29   S/P Inguinal Hernia Repair     In his 20's.   TRANSCAROTID ARTERY REVASCULARIZATION  Left 04/30/2022   Procedure: Transcarotid Artery Revascularization;  Surgeon: Angelia Mould, MD;  Location: [facility];  Service: Vascular;  Laterality: Left;   TRANSTHORACIC ECHOCARDIOGRAM  05/15/2015   Mild LVH, EF 35-40% - Mod HK of mid-apical anteroseptal, anterior & apical walls.  Gr 1 DD. Mild bilateral Atrial Enlargement.   TRANSTHORACIC ECHOCARDIOGRAM  12/2015   EF up to ~40-45% wiht Anterior-Anteroapical HK.  Mod LV dilation with increased LVEDP.   TRANSTHORACIC ECHOCARDIOGRAM  12/'17; 4/'18   a) EF ~30-35% (in setting of HTN Urgency). - Gr 2 DD;; b) EF 30-35% (per Dr. Sallyanne Kuster - 35-40%). Anterior-anteroapical Akinesis. Gr2 DD w/ high filling pressures. Mild LA dilation. Mild RV dilation.   ULTRASOUND GUIDANCE FOR VASCULAR ACCESS Right 04/30/2022   Procedure: ULTRASOUND GUIDANCE FOR VASCULAR ACCESS;  Surgeon: Angelia Mould, MD;  Location: [place];  Service: Vascular;  Laterality: Right;   Patient Active Problem List   Diagnosis Date Noted   Acute blood loss anemia 05/08/2022   Cellulitis--right forearm 05/08/2022   Malnutrition of moderate degree 05/04/2022   Acute ischemic left middle cerebral artery (MCA) stroke ([place]) 05/01/2022   Hypokalemia 04/25/2022   Acute respiratory failure with hypoxia and hypercapnia (H[facility])    Acute CVA (cerebrovascular accident) (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   Personal history of colonic polyps 10/23/2021   Personal history of other diseases of the digestive system 10/23/2021   Psoriasis 10/23/2021   Type 2 diabetes mellitus  with other specified complication ([place]) 58/04/9832   Abnormal abdominal CT scan 12/26/2019   Claudication ([place]) 10/24/2019   Trochanteric bursitis of right hip 12/22/2018   CKD (chronic kidney disease), stage III ([place]) 07/08/2016   Acute respiratory failure with hypoxia ([place]) 07/08/2016   GERD (gastroesophageal reflux disease) 07/08/2016   Coronary artery disease involving native coronary artery of native heart with angina pectoris ([place]) 07/05/2015   Depression 07/05/2015   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 ([place]) 05/18/2015   Right homonymous hemianopsia 05/18/2015   Gait disturbance, post-stroke 05/18/2015   ST elevation myocardial infarction (STEMI) of anterior wall, subsequent episode of care ([place]) 05/15/2015   Lower GI bleed 05/15/2015   Carotid arterial disease ([place]) 05/11/2015   Cardiomyopathy, ischemic: EF most recently 35-40%) 05/11/2015   CAD-native artery 05/11/2015    Class: Status post   Presence of drug coated stent in LAD coronary artery 05/08/2015   Prediabetes 05/06/2015   Essential hypertension 07/05/2014   Hyperlipidemia with target LDL less than  70 07/05/2014   Former heavy cigarette smoker (20-39 per day) 07/05/2014    ONSET DATE: 04/21/22   REFERRING DIAG: Y07.371 (ICD-10-CM) - Cerebral infarction due to unspecified occlusion or stenosis of left middle cerebral artery   THERAPY DIAG:  Aphasia  Cognitive communication deficit  Rationale for Evaluation and Treatment Rehabilitation  SUBJECTIVE:   SUBJECTIVE STATEMENT: "Great"  PAIN:  Are you having pain? 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 In novel check writing to [facility] he required cues to  ID spelling error (twety/twenty) and wrote 123 instead of 126 in number amount box, even though he wrote written amount for one hundred twenty six. Spouse reports he wrote a check at home successfully after 3 trials and her supervision/cues.   To target word finding, attention, auditory processing  [facility], hospital ([facility]) was utilized. The pt generated 3 subjects and objects for 3 verbs, (Sell, [place], [place]) for a total of 9 subject objects. Pt required usual min to mod questioning and semantic cues. Pt generated 3 complex sentences by answering "wh" questions. Pt required occasional min verbal cues to generate complex sentences. He required repetition and re-wording of questions re: his homework and gestural cues with repetitions to comprehens "wh" questions and semantic cues   06-17-22: SLP facilitated ongoing language and attention rehabilitation through [employer]	[place] ([facility]). The pt generated 3 subjects and 3 objects for 3 target verbs (play, ride, bake) with usual mod-max verbal cueing (questioning, semantic, phonemic). Able to answer wh- questions demonstrating verbal reasoning and functional auditory comprehension in 80% of trials using generated sentences from [facility]. Throughout, incorporated generative naming based on verb with pt able to generate x7 across 3 trials with usual mod-A following initial x3.   06-12-22: To target attention, auditory comprehension, word finding and sentence generatoin  [facility], hospital ([facility]) was utilized. The pt generated 3 subjects and objects for 3 verbs (deliver, measure, drive) for a total of 9 subject objects. Pt required usual mod semantic and questioning cues. Pt generated 3 complex sentences by answering "wh" questions. Pt required usual repetition, written and semantic cues to generate complex sentences. He required mod A to ID 5 paraphasias. Targeted sequencing, organization and word finding explaining  how to play [place]. 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 He has returned to playing daily with is daughter who lives out of town. With questioning cues, [name] Id'd cognitive impairments of memory, attention, comprehension and verbal expression.    06-10-22: SLP facilitated completion of patient reported outcome measure to determine functional needs related to memory. See above. Pt occasionally skipping lines, SLP advised on strategy of limiting visual field to aid in attention. SLP introduced Chartered loss adjuster (SFA) to support intentional circumlocution and rehabilitate lexical retrieval. SLP provided direct instruction and model with target word "apple." Following, pt able to generate 5/6 features for calendar, 6/6 features for toothbrush, given usual mod-A. SLP provides education on purpose of exercise until pt able to verbalize understanding. Update HEP  06/03/22: SLP completed CLQT, standardized cognitive assessment. Pt with increased attention demonstrated throughout session from baseline at eval (  pt reportedly zoning out during initial visit), appears to maintain attention to therapist and converation throughout. Requesting repetitions frequently, likely 2/2 attention and hearing impairment. Spouse reports pt is now able to navigate to and open preferred game at home without her A.  CLQT: Attention: Severe, Memory: Moderate, Executive Function: Moderate, Language: Moderate, Visuospatial Skills: Moderate, and Clock Drawing: Severe  Overall: Moderate severity  Comments: SLP required to speak slowly and loudly. Pt requesting frequent repetitions. During story re-tell, says "I wasn't paying attention," when questioned, says this happens frequently in conversations. X1 instance of emergent awareness during design generation, however, then pt perseverates on single design without awareness. Attempts maze from both  directions without success.   05/29/22: SLP educated pt and wife about some of pt's deficit areas (attention, awareness). SLP provided wife with some ideas of tasks pt could accomplish at home to target attention. SLP suggested pt practice writing out checks prior to writing out two checks with wife supervising. Mateo Flow told SLP she would like to return to work the end of November and asked SLP if he thought that was going to be possible. SLP told Mateo Flow that may be too soon but we would need to see how pt progresses.  When told that SLP schedule was limited mostly to some 3:30 pm slots for the next 4 weeks pt wife inquired if there were other sites that offered ST. Mateo Flow was pleased to hear about [facility], and SLP offered to have her receive ST services there if availability allowed.    PATIENT EDUCATION: Education details: see "today's treatment" Person educated: Patient and Spouse Education method: Explanation and Demonstration Education comprehension: verbalized understanding     GOALS: Goals reviewed with patient? No To be done at first therapy session  SHORT TERM GOALS: Target date: 06/26/2022    Pt will complete CLQT and goals added PRN Baseline: Goal status: MET  2.  Pt will complete receptive and expressive language assessment, and PROM PRN Baseline:  Goal status: ONGOING  3.  Pt will attend to functional language tasks for 5 minutes, x5/session Baseline:  Goal status: ONGOING  4.  Pt will indicate awareness of 2 cognitive deficits in 3 sessions Baseline:  Goal status: ONGOING  5.  Pt will write x3 checks with 100% accuracy with use of compensations PRN Baseline:  Goal status: ONGOING   LONG TERM GOALS: Target date: 08/21/2022    Pt will complete receptive and expressive language tasks with 100% accuracy given compensations in 3 sessions Baseline:  Goal status: ONGOING  2.  With written cues, pt will accurately complete sequence to play 1-2 games on his  computer between 3 sessions Baseline:  Goal status: ONGOING  3.  Pt will describe 3 compensations he uses/ has used to improve memory in 3 sessions Baseline:  Goal status: ONGOING  4.  Pt will attend to structured task for 10 minutes x2/session with no more than 1 redirection required per trial Baseline:  Goal status: ONGOING  5.  Pt and spouse will report initiation and completion of x2 home based tasks over 1 week period with mod-I Baseline:  Goal status:ONGOING   ASSESSMENT:  CLINICAL IMPRESSION: Patient is a 76 y.o. male who was seen today for initiation of cognitive communication testing. Wife reports "He gets confused all the time" when asked what she thinks is his most significant deficit post-CVA and provided examples of inability to work the [employer] remote and to sequence steps to play games on his computer. He  is not managing/tracking appointments, nor is he paying bills - which he did prior to CVA.  Ongoing training in compensations for attention, processing, word finding. Continue skilled ST to maximize cognition and language for safety, independence and to reduce caregiver burden.  OBJECTIVE IMPAIRMENTS include attention, memory, awareness, executive functioning, expressive language, and receptive language. These impairments are limiting patient from managing appointments, managing finances, household responsibilities, ADLs/IADLs, and effectively communicating at home and in community. Factors affecting potential to achieve goals and functional outcome are ability to learn/carryover information, co-morbidities, and severity of impairments.. Patient will benefit from skilled SLP services to address above impairments and improve overall function.  REHAB POTENTIAL: Good  PLAN: SLP FREQUENCY: 2x/week  SLP DURATION: 12 weeks  PLANNED INTERVENTIONS: Language facilitation, Environmental controls, Cueing hierachy, Cognitive reorganization, Internal/external aids, Functional  tasks, Multimodal communication approach, SLP instruction and feedback, Compensatory strategies, and Patient/family education    Su Monks, CCC-SLP 06/10/2022, 8:45 AM

## 2022-06-20 DIAGNOSIS — I63532 Cerebral infarction due to unspecified occlusion or stenosis of left posterior cerebral artery: Secondary | ICD-10-CM | POA: Diagnosis not present

## 2022-06-20 DIAGNOSIS — I4891 Unspecified atrial fibrillation: Secondary | ICD-10-CM | POA: Diagnosis not present

## 2022-06-24 ENCOUNTER — Ambulatory Visit: Payer: Medicare Other | Admitting: Speech Pathology

## 2022-06-24 DIAGNOSIS — R41841 Cognitive communication deficit: Secondary | ICD-10-CM | POA: Diagnosis not present

## 2022-06-24 DIAGNOSIS — R4701 Aphasia: Secondary | ICD-10-CM

## 2022-06-24 NOTE — Therapy (Signed)
[facility] OUTPATIENT SPEECH LANGUAGE PATHOLOGY TREATMENT   Patient Name: Damon Walker MRN: [id number] DOB:Dec 08, 1945, 76 y.o., male Today's Date: 06/10/2022  PCP: [name], MD  REFERRING PROVIDER: Jennye Boroughs, MD    End of Session - 06/24/22 0841     Visit Number 7    Number of Visits 25    Date for SLP Re-Evaluation 08/27/22    SLP Start Time 0842    SLP Stop Time  0927    SLP Time Calculation (min) 45 min    Activity Tolerance Patient tolerated treatment well              Past Medical History:  Diagnosis Date   Carotid arterial disease ([place]) 06/2014; 01/09/2015   Followed by Dr. Donnetta Hutching of [facility] -- a) Carotid u/s: 40-59% bilat ICA stenosis; b) 10/2019: Carotid Dopplers October 19, 4780: R ICA 9-56%, LICA 21-30%.  Right vertebral artery appears occluded.  Normal subclavian flow.  Normal left vertebral artery. 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  MRI Brain: Acute L Occipital Lobe Infarct. Chronic microvascular ischemic changes in the white matter and left pons.  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   Chronic combined systolic and diastolic congestive heart failure, NYHA class 2 ([place]) 05/11/2015 - 05/15/15   a. EF 20-25% with anterior-anteroseptal akinesis (immediately post anterior STEMI).  ; b. 10/10/'16 Echo: EF 35-40% with moderate mid-apical anteroseptal, anterior and apical hypokinesis.   Coronary artery disease involving native heart 05/11/2015   Cath: 3V CAD with 90% prox RCA, 80% mid RCA, 80% OM3, (RCA and OM residual treated medically) 100% LAD - PCI to Prox-Mid LAD with Overlapping Promus Premier DES 3.0 x 38 & 3.0 x 16 (post-dilated to 3.5 mm)    Essential hypertension    H/O: GI bleed 05/15/2015   s/p Sigmoid  Polypectomy 05/04/2015 - prior to STEMI on May 11, 2015; Had GI Bleed following TPA for CVA, while on ASA & Brilinta; Flex Sig - 1. Internal Hemorrhoids, 2. Distal sigmoid polypectomy site with flat [place] status post biopsy and tattoo with 70m spot over 3 injections 3. Proximal sigmoid polypectomy site seen as well, 4) otherwise normal with no evidence of bleed   Hyperlipidemia with target LDL less than 70    Ischemic cardiomyopathy 05/11/2015   EF 40% on cath 05/11/2015 after anterior STEMI, EF 20-25% on echo 05/13/2015; 12/2015 =  EF ~45%.   Liver disease, chronic, with cirrhosis ([place] 11/2019   Mild possible early cirrhotic liver disease noted on CT scan.   Prediabetes Oct 2016   A1C 6.0 in Oct 2016   ST elevation myocardial infarction (STEMI) involving left anterior descending (LAD) coronary artery with complication ([place] 186/12/7844  100% Prox LAD - PCI with overlapping DES x 2   Tobacco abuse    a. 30 yrs - 1.5 ppd. - Quit 05/11/2015   Type 2 diabetes mellitus with other specified complication ([place] 39/62/9528  Vision abnormalities    Right hemianopsia   Past Surgical History:  Procedure Laterality Date   CARDIAC CATHETERIZATION N/A 05/11/2015   Procedure: Left Heart Cath and Coronary Angiography;  Surgeon: Peter M JMartinique MD;  [facility];  Service: Cardiovascular;  100% pLAD (long lesion). RCA - prox 90%, mid 80%. OM2 &  OM3 80% (OM2 & 3 not necessarily PCI targets); EF 35-45% with Anterio HK   CARDIAC CATHETERIZATION N/A 05/11/2015   Procedure: Coronary Stent Intervention;  Surgeon: Peter M Martinique, MD;  Location: [place] [facility];  Service: Cardiovascular; PCI to Prox-Mid LAD with Overlapping Promus Premier DES 3.0 x 38 & 3.0 x 16 (post-dilated to 3.5 mm)     FLEXIBLE SIGMOIDOSCOPY N/A 05/18/2015   Procedure: FLEXIBLE SIGMOIDOSCOPY;  Surgeon: Clarene Essex, MD;  Location: [place];  Service: Endoscopy;  Laterality: N/A;   NM MYOVIEW LTD  08/2016   EF 30% with large  anterior-anteroseptal and apical infarct consistent with LAD infarct. His HIGH RISK because of large infarct. No ischemia.   S/P Appendectomy     Age 51   S/P Inguinal Hernia Repair     In his 20's.   TRANSCAROTID ARTERY REVASCULARIZATION  Left 04/30/2022   Procedure: Transcarotid Artery Revascularization;  Surgeon: Angelia Mould, MD;  [facility];  Service: Vascular;  Laterality: Left;   TRANSTHORACIC ECHOCARDIOGRAM  05/15/2015   Mild LVH, EF 35-40% - Mod HK of mid-apical anteroseptal, anterior & apical walls.  Gr 1 DD. Mild bilateral Atrial Enlargement.   TRANSTHORACIC ECHOCARDIOGRAM  12/2015   EF up to ~40-45% wiht Anterior-Anteroapical HK.  Mod LV dilation with increased LVEDP.   TRANSTHORACIC ECHOCARDIOGRAM  12/'17; 4/'18   a) EF ~30-35% (in setting of HTN Urgency). - Gr 2 DD;; b) EF 30-35% (per Dr. Sallyanne Kuster - 35-40%). Anterior-anteroapical Akinesis. Gr2 DD w/ high filling pressures. Mild LA dilation. Mild RV dilation.   ULTRASOUND GUIDANCE FOR VASCULAR ACCESS Right 04/30/2022   Procedure: ULTRASOUND GUIDANCE FOR VASCULAR ACCESS;  Surgeon: Angelia Mould, MD;  Location: [facility];  Service: Vascular;  Laterality: Right;   Patient Active Problem List   Diagnosis Date Noted   Acute blood loss anemia 05/08/2022   Cellulitis--right forearm 05/08/2022   Malnutrition of moderate degree 05/04/2022   Acute ischemic left middle cerebral artery (MCA) stroke ([place]) 05/01/2022   Hypokalemia 04/25/2022   Acute respiratory failure with hypoxia and hypercapnia (H[facility])    Acute CVA (cerebrovascular accident) ([place]) 04/21/2022   Combined systolic and diastolic heart failure ([place]) 04/21/2022   Abnormal bowel movement 10/23/2021   Hearing loss 10/23/2021   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   Personal history of colonic polyps 10/23/2021   Personal history of other diseases of the digestive system 10/23/2021   Psoriasis 10/23/2021   Type 2 diabetes mellitus  with other specified complication ([place]) 22/09/5425   Abnormal abdominal CT scan 12/26/2019   Claudication ([place]) 10/24/2019   Trochanteric bursitis of right hip 12/22/2018   CKD (chronic kidney disease), stage III ([place]) 07/08/2016   Acute respiratory failure with hypoxia ([place]) 07/08/2016   GERD (gastroesophageal reflux disease) 07/08/2016   Coronary artery disease involving native coronary artery of native heart with angina pectoris ([place]) 07/05/2015   Depression 07/05/2015   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 ([place]) 05/18/2015   Right homonymous hemianopsia 05/18/2015   Gait disturbance, post-stroke 05/18/2015   ST elevation myocardial infarction (STEMI) of anterior wall, subsequent episode of care ([place]) 05/15/2015   Lower GI bleed 05/15/2015   Carotid arterial disease ([place]) 05/11/2015   Cardiomyopathy, ischemic: EF most recently 35-40%) 05/11/2015   CAD-native artery 05/11/2015    Class: Status post   Presence of drug coated stent in LAD coronary artery 05/08/2015   Prediabetes 05/06/2015   Essential hypertension 07/05/2014   Hyperlipidemia with target LDL less than  70 07/05/2014   Former heavy cigarette smoker (20-39 per day) 07/05/2014    ONSET DATE: 04/21/22   REFERRING DIAG: L89.211 (ICD-10-CM) - Cerebral infarction due to unspecified occlusion or stenosis of left middle cerebral artery   THERAPY DIAG:  Aphasia  Cognitive communication deficit  Rationale for Evaluation and Treatment Rehabilitation  SUBJECTIVE:   SUBJECTIVE STATEMENT: "He wrote the rent check on the first try"  PAIN:  Are you having pain? No  OBJECTIVE:   TODAY'S TREATMENT:   06-24-22: Target verbal expression in verbal reasoning task with check writing component. Pt to generate store he could spend designated amount at, and provide reasonable items he could buy for said amount. Following, pt wrote checks with payee, amount written and numerical, memo, and date all filled in.  Pt able to generate reasonable purchase in 100% of trials, fills out check accurately in 90% of trials with rare min-A for maintaining attention to single check and completeness (e.g. leaving off cents, leaving day off date).   06-19-22: Targeted check writing - Damon Walker wrote 2 personally relevant checks to [facility] and his rental company - he required cues to ID errors (he wrote 2003, and signed below the signature line, wife cued him to draw a line after he wrote in amount since he did this prior to CVA). In novel check writing to [facility] he required cues to ID spelling error (twety/twenty) and wrote 123 instead of 126 in number amount box, even though he wrote written amount for one hundred twenty six. Spouse reports he wrote a check at home successfully after 3 trials and her supervision/cues.    PATIENT EDUCATION: Education details: see "today's treatment" Person educated: Patient and Spouse Education method: Explanation and Demonstration Education comprehension: verbalized understanding     GOALS: Goals reviewed with patient? No To be done at first therapy session  SHORT TERM GOALS: Target date: 06/26/2022    Pt will complete CLQT and goals added PRN Baseline: Goal status: MET  2.  Pt will complete receptive and expressive language assessment, and PROM PRN Baseline:  Goal status: ONGOING  3.  Pt will attend to functional language tasks for 5 minutes, x5/session Baseline:  Goal status: ONGOING  4.  Pt will indicate awareness of 2 cognitive deficits in 3 sessions Baseline:  Goal status: ONGOING  5.  Pt will write x3 checks with 100% accuracy with use of compensations PRN Baseline:  Goal status: ONGOING   LONG TERM GOALS: Target date: 08/21/2022    Pt will complete receptive and expressive language tasks with 100% accuracy given compensations in 3 sessions Baseline:  Goal status: ONGOING  2.  With written cues, pt will accurately complete sequence to play 1-2 games on  his computer between 3 sessions Baseline:  Goal status: ONGOING  3.  Pt will describe 3 compensations he uses/ has used to improve memory in 3 sessions Baseline:  Goal status: ONGOING  4.  Pt will attend to structured task for 10 minutes x2/session with no more than 1 redirection required per trial Baseline:  Goal status: ONGOING  5.  Pt and spouse will report initiation and completion of x2 home based tasks over 1 week period with mod-I Baseline:  Goal status:ONGOING   ASSESSMENT:  CLINICAL IMPRESSION: Patient is a 76 y.o. male who was seen today for initiation of cognitive communication testing. Wife reports "He gets confused all the time" when asked what she thinks is his most significant deficit post-CVA and provided examples of inability to work  the [employer] remote and to sequence steps to play games on his computer. He is not managing/tracking appointments, nor is he paying bills - which he did prior to CVA.  Ongoing training in compensations for attention, processing, word finding. Continue skilled ST to maximize cognition and language for safety, independence and to reduce caregiver burden.  OBJECTIVE IMPAIRMENTS include attention, memory, awareness, executive functioning, expressive language, and receptive language. These impairments are limiting patient from managing appointments, managing finances, household responsibilities, ADLs/IADLs, and effectively communicating at home and in community. Factors affecting potential to achieve goals and functional outcome are ability to learn/carryover information, co-morbidities, and severity of impairments.. Patient will benefit from skilled SLP services to address above impairments and improve overall function.  REHAB POTENTIAL: Good  PLAN: SLP FREQUENCY: 2x/week  SLP DURATION: 12 weeks  PLANNED INTERVENTIONS: Language facilitation, Environmental controls, Cueing hierachy, Cognitive reorganization, Internal/external aids,  Functional tasks, Multimodal communication approach, SLP instruction and feedback, Compensatory strategies, and Patient/family education    Su Monks, CCC-SLP 06/10/2022, 8:45 AM

## 2022-07-01 ENCOUNTER — Ambulatory Visit: Payer: Medicare Other | Admitting: Speech Pathology

## 2022-07-01 DIAGNOSIS — R4701 Aphasia: Secondary | ICD-10-CM

## 2022-07-01 DIAGNOSIS — R41841 Cognitive communication deficit: Secondary | ICD-10-CM | POA: Diagnosis not present

## 2022-07-01 NOTE — Patient Instructions (Signed)
Local Driver Evaluation Programs: ° °Comprehensive Evaluation: includes clinical and in vehicle behind the wheel testing by OCCUPATIONAL THERAPIST. Programs have varying levels of adaptive controls available for trial.  ° °[facility] °[address] °[place], [place]  [address] °[phone] or [phone] °http://www.driver-rehab.com °Evaluator:  Cyndee Crompton, OT/CDRS/CDI/SCDCM/Low Vision Certification ° °[facility] °[address] °[place], [place] [address] °[phone] °https://www.[facility].org/home/services/rehabilitation.aspx °Evaluators:  Shannon Sheek, OT and Jill Tucker, OT ° °[facility] [place] [facility] (ONLY SERVES VETERANS!!) °[facility] °[address] °[place], [place]  [address] °[phone] °http://www.[place].va.gov/services/Physical_Medicine_Rehabilitation_Services.asp °Evaluators:  Eric Andrews, KT; Heidi Harris, KT;  Gary Whitaker, KT (KT=kiniesotherapist) ° ° °Clinical evaluations only:  Includes clinical testing, refers to other programs or local certified driving instructor for behind the wheel testing. ° °[facility] (outpatient Rehab) °[facility] °[address] °[place], [place] [address] °[phone] for scheduling °[email or web address]://[email or web address]/Outpatient-Rehabilitation/Neurorehabilitation-Therapy.htm °Evaluators:  Kelly Lambeth, OT; Kate Phillips, OT ° °Other area clinical evaluators available upon request including [facility], [facility] and [facility]. ° ° °    Resource List °What is a Driver Evaluation: °Your Road Ahead - A Guide to Comprehensive Driving Evaluations °http://www.thehartford.com/resources/mature-market-excellence/publications-on-aging ° °Association for [employer] Rehabilitation Services - Disability and Driving Fact Sheets °http://www.aded.net/?page=510 ° °Driving after a Brain  Injury: °[employer] °http://www.biausa.org/tbims-abstracts/if-there-is-an-effective-way-to-determine-if-someone-is-ready-to-drive-after-tbi?A=SearchResult&SearchID=9495675&ObjectID=2758842&ObjectType=35 ° °Driving with Adaptive Equipment: °[facility] °http://www.driver-rehab.com/adaptive-equipment ° °[employer] °http://www.nmeda.com/ ° ° ° ° ° ° °  °

## 2022-07-01 NOTE — Telephone Encounter (Unsigned)
FYI: Spoke with wife and gave her the replies from J. Quentin Ore.: "Will review the 2 week Zio when results received.  Ok to hold off on second monitor given sensitive skin.  BP acceptable.  No change in therapy recommended."  Heart monitor: "Good news --- Underlying rhythm is normal sinus.  Average heart rate 66.  No Sustained Arrhythmias.  No atrial fibrillation found -- therefore an arrhythmia is likely not the cause of your stoke. No change in therapy needed."   Wife provided BP since Tuesday 11/21: 11/21: 148/68; 138/75 11/22: 143/64 11/23: she gave "prophylactic lasix '20mg'$  before T. Giving meal" 114/57; that evening 139/57 11/24: 159/78 11/25: 162/80 took losartan; 142/72; 149/74 11/27 141/61  Patient denies SOB or chest pain.  Recommended BP check 1-2 hours after BP medication.

## 2022-07-01 NOTE — Telephone Encounter (Signed)
Pt's wife calling back to f/u from call on 11/15. She would also like to speak with nurses regarding pt's BP. Please advise

## 2022-07-01 NOTE — Therapy (Signed)
[facility] OUTPATIENT SPEECH LANGUAGE PATHOLOGY TREATMENT   Patient Name: Damon Walker MRN: [id number] DOB:[birth year], 76 y.o., male Today's Date: 06/10/2022  PCP: [name], MD  REFERRING PROVIDER: Jennye Boroughs, MD    End of Session - 07/01/22 0843     Visit Number 8    Number of Visits 25    Date for SLP Re-Evaluation 08/27/22    SLP Start Time 0845    SLP Stop Time  0927    SLP Time Calculation (min) 42 min    Activity Tolerance Patient tolerated treatment well               Past Medical History:  Diagnosis Date   Carotid arterial disease ([place]) 06/2014; 01/09/2015   Followed by Dr. Donnetta Hutching of [facility] -- a) Carotid u/s: 40-59% bilat ICA stenosis; b) 10/2019: Carotid Dopplers October 19, 2949: R ICA 8-84%, LICA 16-60%.  Right vertebral artery appears occluded.  Normal subclavian flow.  Normal left vertebral artery. 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  MRI Brain: Acute L Occipital Lobe Infarct. Chronic microvascular ischemic changes in the white matter and left pons.  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   Chronic combined systolic and diastolic congestive heart failure, NYHA class 2 ([place]) 05/11/2015 - 05/15/15   a. EF 20-25% with anterior-anteroseptal akinesis (immediately post anterior STEMI).  ; b. 10/10/'16 Echo: EF 35-40% with moderate mid-apical anteroseptal, anterior and apical hypokinesis.   Coronary artery disease involving native heart 05/11/2015   Cath: 3V CAD with 90% prox RCA, 80% mid RCA, 80% OM3, (RCA and OM residual treated medically) 100% LAD - PCI to Prox-Mid LAD with Overlapping Promus Premier DES 3.0 x 38 & 3.0 x 16 (post-dilated to 3.5 mm)    Essential hypertension    H/O: GI bleed 05/15/2015   s/p Sigmoid  Polypectomy 05/04/2015 - prior to STEMI on May 11, 2015; Had GI Bleed following TPA for CVA, while on ASA & Brilinta; Flex Sig - 1. Internal Hemorrhoids, 2. Distal sigmoid polypectomy site with flat [place] status post biopsy and tattoo with 71m spot over 3 injections 3. Proximal sigmoid polypectomy site seen as well, 4) otherwise normal with no evidence of bleed   Hyperlipidemia with target LDL less than 70    Ischemic cardiomyopathy 05/11/2015   EF 40% on cath 05/11/2015 after anterior STEMI, EF 20-25% on echo 05/13/2015; 12/2015 =  EF ~45%.   Liver disease, chronic, with cirrhosis ([place] 11/2019   Mild possible early cirrhotic liver disease noted on CT scan.   Prediabetes Oct 2016   A1C 6.0 in Oct 2016   ST elevation myocardial infarction (STEMI) involving left anterior descending (LAD) coronary artery with complication ([place] 163/0/1601  100% Prox LAD - PCI with overlapping DES x 2   Tobacco abuse    a. 30 yrs - 1.5 ppd. - Quit 05/11/2015   Type 2 diabetes mellitus with other specified complication ([facility] 30/93/2355  Vision abnormalities    Right hemianopsia   Past Surgical History:  Procedure Laterality Date   CARDIAC CATHETERIZATION N/A 05/11/2015   Procedure: Left Heart Cath and Coronary Angiography;  Surgeon: Peter M JMartinique MD;  [facility];  Service: Cardiovascular;  100% pLAD (long lesion). RCA - prox 90%, mid 80%.  OM2 & OM3 80% (OM2 & 3 not necessarily PCI targets); EF 35-45% with Anterio HK   CARDIAC CATHETERIZATION N/A 05/11/2015   Procedure: Coronary Stent Intervention;  Surgeon: Peter M Martinique, MD;  Location: [facility];  Service: Cardiovascular; PCI to Prox-Mid LAD with Overlapping Promus Premier DES 3.0 x 38 & 3.0 x 16 (post-dilated to 3.5 mm)     FLEXIBLE SIGMOIDOSCOPY N/A 05/18/2015   Procedure: FLEXIBLE SIGMOIDOSCOPY;  Surgeon: Clarene Essex, MD;  Location: [facility];  Service: Endoscopy;  Laterality: N/A;   NM MYOVIEW LTD  08/2016   EF 30% with large  anterior-anteroseptal and apical infarct consistent with LAD infarct. His HIGH RISK because of large infarct. No ischemia.   S/P Appendectomy     Age 56   S/P Inguinal Hernia Repair     In his 20's.   TRANSCAROTID ARTERY REVASCULARIZATION  Left 04/30/2022   Procedure: Transcarotid Artery Revascularization;  Surgeon: Angelia Mould, MD;  Location: [facility];  Service: Vascular;  Laterality: Left;   TRANSTHORACIC ECHOCARDIOGRAM  05/15/2015   Mild LVH, EF 35-40% - Mod HK of mid-apical anteroseptal, anterior & apical walls.  Gr 1 DD. Mild bilateral Atrial Enlargement.   TRANSTHORACIC ECHOCARDIOGRAM  12/2015   EF up to ~40-45% wiht Anterior-Anteroapical HK.  Mod LV dilation with increased LVEDP.   TRANSTHORACIC ECHOCARDIOGRAM  12/'17; 4/'18   a) EF ~30-35% (in setting of HTN Urgency). - Gr 2 DD;; b) EF 30-35% (per Dr. Sallyanne Kuster - 35-40%). Anterior-anteroapical Akinesis. Gr2 DD w/ high filling pressures. Mild LA dilation. Mild RV dilation.   ULTRASOUND GUIDANCE FOR VASCULAR ACCESS Right 04/30/2022   Procedure: ULTRASOUND GUIDANCE FOR VASCULAR ACCESS;  Surgeon: Angelia Mould, MD;  Location: [facility];  Service: Vascular;  Laterality: Right;   Patient Active Problem List   Diagnosis Date Noted   Acute blood loss anemia 05/08/2022   Cellulitis--right forearm 05/08/2022   Malnutrition of moderate degree 05/04/2022   Acute ischemic left middle cerebral artery (MCA) stroke ([place]) 05/01/2022   Hypokalemia 04/25/2022   Acute respiratory failure with hypoxia and hypercapnia ([facility])    Acute CVA (cerebrovascular accident) ([place]) 04/21/2022   Combined systolic and diastolic heart failure ([place]) 04/21/2022   Abnormal bowel movement 10/23/2021   Hearing loss 10/23/2021   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   Personal history of colonic polyps 10/23/2021   Personal history of other diseases of the digestive system 10/23/2021   Psoriasis 10/23/2021   Type 2 diabetes mellitus  with other specified complication ([place]) 86[birth year]76[birth year]1950   Abnormal abdominal CT scan 12/26/2019   Claudication ([place]) 10/24/2019   Trochanteric bursitis of right hip 12/22/2018   CKD (chronic kidney disease), stage III ([place]) 07/08/2016   Acute respiratory failure with hypoxia ([place]) 07/08/2016   GERD (gastroesophageal reflux disease) 07/08/2016   Coronary artery disease involving native coronary artery of native heart with angina pectoris ([place]) 07/05/2015   Depression 07/05/2015   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 ([place]) 05/18/2015   Right homonymous hemianopsia 05/18/2015   Gait disturbance, post-stroke 05/18/2015   ST elevation myocardial infarction (STEMI) of anterior wall, subsequent episode of care ([place]) 05/15/2015   Lower GI bleed 05/15/2015   Carotid arterial disease ([place]) 05/11/2015   Cardiomyopathy, ischemic: EF most recently 35-40%) 05/11/2015   CAD-native artery 05/11/2015    Class: Status post   Presence of drug coated stent in LAD coronary artery 05/08/2015   Prediabetes 05/06/2015   Essential hypertension 07/05/2014   Hyperlipidemia with target LDL  less than 70 07/05/2014   Former heavy cigarette smoker (20-39 per day) 07/05/2014    ONSET DATE: 04/21/22   REFERRING DIAG: X52.841 (ICD-10-CM) - Cerebral infarction due to unspecified occlusion or stenosis of left middle cerebral artery   THERAPY DIAG:  Aphasia  Cognitive communication deficit  Rationale for Evaluation and Treatment Rehabilitation  SUBJECTIVE:   SUBJECTIVE STATEMENT: "He was pretty chatty" re: phone conversation with daughter   PAIN:  Are you having pain? No  OBJECTIVE:   TODAY'S TREATMENT:   07-01-22: SLP facilitated discussion to ID factors which contributed to successful communication with variety of family members. Pt ID improving verbal communication, extra time, and having one on one conversations beneficial for communication. SLP re-educated on intentional  circumlocution to aid in relaying message and repeating back to aid in comprehension in conversation. Following instruction, pt says "oh yea I do that." Pt tells SLP he is concerned re: return to driving. SLP educated on cognitive changes which have potential to decrease safety of driving: attention, processing, problem solving, planning, awareness. Pt unable to ID any cognitive changes, indicated though he feels ready to return to driving, his perception ay be impacted by decreased awareness. Provided pt and spouse with handout featuring return to driving resources in local area. Spouse reports she believes pt should wait another few months before trying to drive. Pt and spouse endorse agreement with plan to d/c at upcoming session.    06-24-22: Target verbal expression in verbal reasoning task with check writing component. Pt to generate store he could spend designated amount at, and provide reasonable items he could buy for said amount. Following, pt wrote checks with payee, amount written and numerical, memo, and date all filled in. Pt able to generate reasonable purchase in 100% of trials, fills out check accurately in 90% of trials with rare min-A for maintaining attention to single check and completeness (e.g. leaving off cents, leaving day off date).   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 In novel check writing to [facility] he required cues to ID spelling error (twety/twenty) and wrote 123 instead of 126 in number amount box, even though he wrote written amount for one hundred twenty six. Spouse reports he wrote a check at home successfully after 3 trials and her supervision/cues.    PATIENT EDUCATION: Education details: see "today's treatment" Person  educated: Patient and Spouse Education method: Explanation and Demonstration Education comprehension: verbalized understanding     GOALS: Goals reviewed with patient? No To be done at first therapy session  SHORT TERM GOALS: Target date: 06/26/2022    Pt will complete CLQT and goals added PRN Baseline: Goal status: MET  2.  Pt will complete receptive and expressive language assessment, and PROM PRN Baseline:  Goal status: deferred  3.  Pt will attend to functional language tasks for 5 minutes, x5/session Baseline:  Goal status: MET  4.  Pt will indicate awareness of 2 cognitive deficits in 3 sessions Baseline:  Goal status: MET  5.  Pt will write x3 checks with 100% accuracy with use of compensations PRN Baseline:  Goal status: MET   LONG TERM GOALS: Target date: 08/21/2022    Pt will complete receptive and expressive language tasks with 100% accuracy  given compensations in 3 sessions Baseline:  Goal status: ONGOING  2.  With written cues, pt will accurately complete sequence to play 1-2 games on his computer between 3 sessions Baseline:  Goal status: ONGOING  3.  Pt will describe 3 compensations he uses/ has used to improve memory in 3 sessions Baseline:  Goal status: ONGOING  4.  Pt will attend to structured task for 10 minutes x2/session with no more than 1 redirection required per trial Baseline:  Goal status: ONGOING  5.  Pt and spouse will report initiation and completion of x2 home based tasks over 1 week period with mod-I Baseline:  Goal status:ONGOING   ASSESSMENT:  CLINICAL IMPRESSION: Patient is a 77 y.o. male who was seen today for initiation of cognitive communication testing. Wife reports "He gets confused all the time" when asked what she thinks is his most significant deficit post-CVA and provided examples of inability to work the [employer] remote and to sequence steps to play games on his computer. He is not managing/tracking appointments,  nor is he paying bills - which he did prior to CVA.  Ongoing training in compensations for attention, processing, word finding. Continue skilled ST to maximize cognition and language for safety, independence and to reduce caregiver burden.  OBJECTIVE IMPAIRMENTS include attention, memory, awareness, executive functioning, expressive language, and receptive language. These impairments are limiting patient from managing appointments, managing finances, household responsibilities, ADLs/IADLs, and effectively communicating at home and in community. Factors affecting potential to achieve goals and functional outcome are ability to learn/carryover information, co-morbidities, and severity of impairments.. Patient will benefit from skilled SLP services to address above impairments and improve overall function.  REHAB POTENTIAL: Good  PLAN: SLP FREQUENCY: 2x/week  SLP DURATION: 12 weeks  PLANNED INTERVENTIONS: Language facilitation, Environmental controls, Cueing hierachy, Cognitive reorganization, Internal/external aids, Functional tasks, Multimodal communication approach, SLP instruction and feedback, Compensatory strategies, and Patient/family education    Su Monks, CCC-SLP 06/10/2022, 8:45 AM

## 2022-07-02 NOTE — Telephone Encounter (Signed)
Spoke with spouse to give her the recommendations of J. Quentin Ore, PA-C: "continue monitoring BP -- those last readings seem higher than his usual.  Bring BP log & cuff to appointment in January." Spouse verbalized understanding.

## 2022-07-03 ENCOUNTER — Ambulatory Visit: Payer: Medicare Other | Admitting: Speech Pathology

## 2022-07-03 ENCOUNTER — Telehealth: Payer: Self-pay

## 2022-07-03 DIAGNOSIS — R4701 Aphasia: Secondary | ICD-10-CM

## 2022-07-03 DIAGNOSIS — R41841 Cognitive communication deficit: Secondary | ICD-10-CM | POA: Diagnosis not present

## 2022-07-03 NOTE — Patient Instructions (Addendum)
Best practice for language is to use it....   Talk to people you care about  Watch a show, challenge yourself to summarize it after   Watching the news: have a discussion about what stories were covered, what their related to, is there a historical connection you can tie it to, give your opinion on news stories   Try and generate as many items as you can in a given category: scattegories game, google "categories"   Pick a letter: name as many items as you can for that letter  Research and interesting topic and explain it to someone   Pick a topic and list items related to that topic from A-Z  Procedural discourse: verbalize HOW you would do a task   WRITING Write the names of items around your house, to-do lists, grocery lists, medication logs, or daily activity logs. Write down your family member's names and organize it into a family tree Write down common personal phrases you usually say.  Write a summary of a news story or short video you watch online or on TV.

## 2022-07-03 NOTE — Therapy (Signed)
[facility] OUTPATIENT SPEECH LANGUAGE PATHOLOGY TREATMENT (DISCHARGE)   Patient Name: Damon Walker MRN: [id number] DOB:01-14-1946, 76 y.o., male Today's Date: 06/10/2022  PCP: [name], MD  REFERRING PROVIDER: Jennye Boroughs, MD    End of Session - 07/03/22 0846     Visit Number 9    Number of Visits 25    Date for SLP Re-Evaluation 08/27/22    SLP Start Time 0846    SLP Stop Time  0919    SLP Time Calculation (min) 33 min    Activity Tolerance Patient tolerated treatment well                Past Medical History:  Diagnosis Date   Carotid arterial disease ([place]) 06/2014; 01/09/2015   Followed by Dr. Donnetta Hutching of [facility] -- a) Carotid u/s: 40-59% bilat ICA stenosis; b) 10/2019: Carotid Dopplers October 19, 9324: R ICA 7-12%, LICA 45-80%.  Right vertebral artery appears occluded.  Normal subclavian flow.  Normal left vertebral artery. 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  MRI Brain: Acute L Occipital Lobe Infarct. Chronic microvascular ischemic changes in the white matter and left pons.  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   Chronic combined systolic and diastolic congestive heart failure, NYHA class 2 ([place]) 05/11/2015 - 05/15/15   a. EF 20-25% with anterior-anteroseptal akinesis (immediately post anterior STEMI).  ; b. 10/10/'16 Echo: EF 35-40% with moderate mid-apical anteroseptal, anterior and apical hypokinesis.   Coronary artery disease involving native heart 05/11/2015   Cath: 3V CAD with 90% prox RCA, 80% mid RCA, 80% OM3, (RCA and OM residual treated medically) 100% LAD - PCI to Prox-Mid LAD with Overlapping Promus Premier DES 3.0 x 38 & 3.0 x 16 (post-dilated to 3.5 mm)    Essential hypertension    H/O: GI bleed 05/15/2015   s/p  Sigmoid Polypectomy 05/04/2015 - prior to STEMI on May 11, 2015; Had GI Bleed following TPA for CVA, while on ASA & Brilinta; Flex Sig - 1. Internal Hemorrhoids, 2. Distal sigmoid polypectomy site with flat [place] status post biopsy and tattoo with 89m spot over 3 injections 3. Proximal sigmoid polypectomy site seen as well, 4) otherwise normal with no evidence of bleed   Hyperlipidemia with target LDL less than 70    Ischemic cardiomyopathy 05/11/2015   EF 40% on cath 05/11/2015 after anterior STEMI, EF 20-25% on echo 05/13/2015; 12/2015 =  EF ~45%.   Liver disease, chronic, with cirrhosis ([place] 11/2019   Mild possible early cirrhotic liver disease noted on CT scan.   Prediabetes Oct 2016   A1C 6.0 in Oct 2016   ST elevation myocardial infarction (STEMI) involving left anterior descending (LAD) coronary artery with complication ([facility] 199/03/3381  100% Prox LAD - PCI with overlapping DES x 2   Tobacco abuse    a. 30 yrs - 1.5 ppd. - Quit 05/11/2015   Type 2 diabetes mellitus with other specified complication ([place] 35/12/3974  Vision abnormalities    Right hemianopsia   Past Surgical History:  Procedure Laterality Date   CARDIAC CATHETERIZATION N/A 05/11/2015   Procedure: Left Heart Cath and Coronary Angiography;  Surgeon: Peter M JMartinique MD;  [facility];  Service: Cardiovascular;  100% pLAD (long lesion). RCA - prox 90%,  mid 80%. OM2 & OM3 80% (OM2 & 3 not necessarily PCI targets); EF 35-45% with Anterio HK   CARDIAC CATHETERIZATION N/A 05/11/2015   Procedure: Coronary Stent Intervention;  Surgeon: Peter M Martinique, MD;  Location: [facility];  Service: Cardiovascular; PCI to Prox-Mid LAD with Overlapping Promus Premier DES 3.0 x 38 & 3.0 x 16 (post-dilated to 3.5 mm)     FLEXIBLE SIGMOIDOSCOPY N/A 05/18/2015   Procedure: FLEXIBLE SIGMOIDOSCOPY;  Surgeon: Clarene Essex, MD;  Location: [facility];  Service: Endoscopy;  Laterality: N/A;   NM MYOVIEW LTD  08/2016   EF 30% with  large anterior-anteroseptal and apical infarct consistent with LAD infarct. His HIGH RISK because of large infarct. No ischemia.   S/P Appendectomy     Age 44   S/P Inguinal Hernia Repair     In his 20's.   TRANSCAROTID ARTERY REVASCULARIZATION  Left 04/30/2022   Procedure: Transcarotid Artery Revascularization;  Surgeon: Angelia Mould, MD;  Location: [facility];  Service: Vascular;  Laterality: Left;   TRANSTHORACIC ECHOCARDIOGRAM  05/15/2015   Mild LVH, EF 35-40% - Mod HK of mid-apical anteroseptal, anterior & apical walls.  Gr 1 DD. Mild bilateral Atrial Enlargement.   TRANSTHORACIC ECHOCARDIOGRAM  12/2015   EF up to ~40-45% wiht Anterior-Anteroapical HK.  Mod LV dilation with increased LVEDP.   TRANSTHORACIC ECHOCARDIOGRAM  12/'17; 4/'18   a) EF ~30-35% (in setting of HTN Urgency). - Gr 2 DD;; b) EF 30-35% (per Dr. Sallyanne Kuster - 35-40%). Anterior-anteroapical Akinesis. Gr2 DD w/ high filling pressures. Mild LA dilation. Mild RV dilation.   ULTRASOUND GUIDANCE FOR VASCULAR ACCESS Right 04/30/2022   Procedure: ULTRASOUND GUIDANCE FOR VASCULAR ACCESS;  Surgeon: Angelia Mould, MD;  Location: [facility];  Service: Vascular;  Laterality: Right;   Patient Active Problem List   Diagnosis Date Noted   Acute blood loss anemia 05/08/2022   Cellulitis--right forearm 05/08/2022   Malnutrition of moderate degree 05/04/2022   Acute ischemic left middle cerebral artery (MCA) stroke ([place]) 05/01/2022   Hypokalemia 04/25/2022   Acute respiratory failure with hypoxia and hypercapnia (HCC)    Acute CVA (cerebrovascular accident) ([place]) 04/21/2022   Combined systolic and diastolic heart failure ([place]) 04/21/2022   Abnormal bowel movement 10/23/2021   Hearing loss 10/23/2021   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   Personal history of colonic polyps 10/23/2021   Personal history of other diseases of the digestive system 10/23/2021   Psoriasis 10/23/2021   Type 2 diabetes  mellitus with other specified complication ([place]) 76/73/4193   Abnormal abdominal CT scan 12/26/2019   Claudication ([place]) 10/24/2019   Trochanteric bursitis of right hip 12/22/2018   CKD (chronic kidney disease), stage III ([place]) 07/08/2016   Acute respiratory failure with hypoxia ([place]) 07/08/2016   GERD (gastroesophageal reflux disease) 07/08/2016   Coronary artery disease involving native coronary artery of native heart with angina pectoris ([place]) 07/05/2015   Depression 07/05/2015   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 ([place]) 05/18/2015   Right homonymous hemianopsia 05/18/2015   Gait disturbance, post-stroke 05/18/2015   ST elevation myocardial infarction (STEMI) of anterior wall, subsequent episode of care ([place]) 05/15/2015   Lower GI bleed 05/15/2015   Carotid arterial disease ([place]) 05/11/2015   Cardiomyopathy, ischemic: EF most recently 35-40%) 05/11/2015   CAD-native artery 05/11/2015    Class: Status post   Presence of drug coated stent in LAD coronary artery 05/08/2015   Prediabetes 05/06/2015   Essential hypertension 07/05/2014   Hyperlipidemia with  target LDL less than 70 07/05/2014   Former heavy cigarette smoker (20-39 per day) 07/05/2014    ONSET DATE: 04/21/22   REFERRING DIAG: R67.893 (ICD-10-CM) - Cerebral infarction due to unspecified occlusion or stenosis of left middle cerebral artery   THERAPY DIAG:  Aphasia  Cognitive communication deficit  Rationale for Evaluation and Treatment Rehabilitation  SUBJECTIVE:   SUBJECTIVE STATEMENT: "I am" re: ready for discharge   PAIN:  Are you having pain? No  OBJECTIVE:   TODAY'S TREATMENT:   07-03-22: Pt tells SLP he is having no difficulties accessing and playing his preferred game. Pt tells SLP most concerning issue is he has a new computer in which use of has a learning curve. As access to family members who are able to A. SLP assisted in re-administration of memory questionnaire, with no  ongoing concerns ID. Spouse endorses she has observed improvement since initiation of ST. SLP provided recommendations for ongoing HEP to aid in improved communication abilities. Education re: need for new referral should needs change. Agreeable to d/c this date.   07-01-22: SLP facilitated discussion to ID factors which contributed to successful communication with variety of family members. Pt ID improving verbal communication, extra time, and having one on one conversations beneficial for communication. SLP re-educated on intentional circumlocution to aid in relaying message and repeating back to aid in comprehension in conversation. Following instruction, pt says "oh yea I do that." Pt tells SLP he is concerned re: return to driving. SLP educated on cognitive changes which have potential to decrease safety of driving: attention, processing, problem solving, planning, awareness. Pt unable to ID any cognitive changes, indicated though he feels ready to return to driving, his perception ay be impacted by decreased awareness. Provided pt and spouse with handout featuring return to driving resources in local area. Spouse reports she believes pt should wait another few months before trying to drive. Pt and spouse endorse agreement with plan to d/c at upcoming session.    06-24-22: Target verbal expression in verbal reasoning task with check writing component. Pt to generate store he could spend designated amount at, and provide reasonable items he could buy for said amount. Following, pt wrote checks with payee, amount written and numerical, memo, and date all filled in. Pt able to generate reasonable purchase in 100% of trials, fills out check accurately in 90% of trials with rare min-A for maintaining attention to single check and completeness (e.g. leaving off cents, leaving day off date).   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 In novel check writing to [facility] he required cues to ID spelling error (twety/twenty) and wrote 123 instead of 126 in number amount box, even though he wrote written amount for one hundred twenty six. Spouse reports he wrote a check at home successfully after 3 trials and her supervision/cues.    PATIENT EDUCATION: Education details: see "today's treatment" Person educated: Patient and Spouse Education method: Explanation and Demonstration Education comprehension: verbalized understanding     GOALS: Goals reviewed with patient? No To be done at first therapy session  SHORT TERM GOALS: Target date: 06/26/2022    Pt will complete CLQT and goals added PRN Baseline: Goal status: MET  2.  Pt will complete  receptive and expressive language assessment, and PROM PRN Baseline:  Goal status: deferred  3.  Pt will attend to functional language tasks for 5 minutes, x5/session Baseline:  Goal status: MET  4.  Pt will indicate awareness of 2 cognitive deficits in 3 sessions Baseline:  Goal status: MET  5.  Pt will write x3 checks with 100% accuracy with use of compensations PRN Baseline:  Goal status: MET   LONG TERM GOALS: Target date: 08/21/2022    Pt will complete receptive and expressive language tasks with 100% accuracy given compensations in 3 sessions Baseline:  Goal status: not met  2.  With written cues, pt will accurately complete sequence to play 1-2 games on his computer between 3 sessions Baseline:  Goal status: met  3.  Pt will describe 3 compensations he uses/ has used to improve memory in 3 sessions Baseline:  Goal status: not met  4.  Pt will attend to structured task for 10 minutes x2/session with no more than 1 redirection required per trial Baseline:  Goal status: met  5.  Pt and spouse will  report initiation and completion of x2 home based tasks over 1 week period with mod-I Baseline:  Goal status: met   ASSESSMENT:  CLINICAL IMPRESSION: SLP provided training in compensations for attention, processing, word finding. Updated HEP to aid in ongoing improvement as today is final ST session per pt and spouse request.   OBJECTIVE IMPAIRMENTS include attention, memory, awareness, executive functioning, expressive language, and receptive language. These impairments are limiting patient from managing appointments, managing finances, household responsibilities, ADLs/IADLs, and effectively communicating at home and in community. Factors affecting potential to achieve goals and functional outcome are ability to learn/carryover information, co-morbidities, and severity of impairments.. Patient will benefit from skilled SLP services to address above impairments and improve overall function.  REHAB POTENTIAL: Good  PLAN: SLP FREQUENCY: 2x/week  SLP DURATION: 12 weeks  PLANNED INTERVENTIONS: Language facilitation, Environmental controls, Cueing hierachy, Cognitive reorganization, Internal/external aids, Functional tasks, Multimodal communication approach, SLP instruction and feedback, Compensatory strategies, and Patient/family education  SPEECH THERAPY DISCHARGE SUMMARY  Visits from Start of Care: 9  Current functional level related to goals / functional outcomes: Moderate improvement in attention evidenced in ability to participate in desired activities and throughout therapy sessions. Mild improvement in processing and verbal expression demonstrated over therapy course.    Remaining deficits: Expressive aphasia, auditory processing, cognitive impairment   Education / Equipment: Language tasks, external memory aids, processing and attention strategies/compensations, HEP   Patient agrees to discharge. Patient goals were partially met. Patient is being discharged due to the patient's  request. Pt's spouse is returning to work and both feel confident in pt's ability to participate in desired activities at this time.    Su Monks, [place] 06/10/2022, 8:45 AM

## 2022-07-03 NOTE — Telephone Encounter (Addendum)
Called patient regarding results. Left detailed message for patient regarding results.----- Message from Warren Lacy, PA-C sent at 06/26/2022  4:01 PM EST ----- Good news --- Underlying rhythm is normal sinus.  Average heart rate 66.  No Sustained Arrhythmias.  No atrial fibrillation found -- therefore an arrhythmia is likely not the cause of your stoke. No change in therapy needed.

## 2022-07-08 ENCOUNTER — Encounter: Payer: Medicare Other | Admitting: Speech Pathology

## 2022-07-11 DIAGNOSIS — Z86010 Personal history of colonic polyps: Secondary | ICD-10-CM | POA: Diagnosis not present

## 2022-07-11 DIAGNOSIS — K219 Gastro-esophageal reflux disease without esophagitis: Secondary | ICD-10-CM | POA: Diagnosis not present

## 2022-07-26 DIAGNOSIS — I2510 Atherosclerotic heart disease of native coronary artery without angina pectoris: Secondary | ICD-10-CM | POA: Diagnosis not present

## 2022-07-26 DIAGNOSIS — I10 Essential (primary) hypertension: Secondary | ICD-10-CM | POA: Diagnosis not present

## 2022-07-26 DIAGNOSIS — K219 Gastro-esophageal reflux disease without esophagitis: Secondary | ICD-10-CM | POA: Diagnosis not present

## 2022-07-26 DIAGNOSIS — E1169 Type 2 diabetes mellitus with other specified complication: Secondary | ICD-10-CM | POA: Diagnosis not present

## 2022-08-01 ENCOUNTER — Telehealth: Payer: Self-pay | Admitting: Cardiology

## 2022-08-01 MED ORDER — LOSARTAN POTASSIUM 25 MG PO TABS: 25.0000 mg | ORAL_TABLET | Freq: Every day | ORAL | 3 refills | Status: DC

## 2022-08-01 NOTE — Telephone Encounter (Signed)
Patient called in to triage to rep[ort BP 199/90, P 82 and 185/96, P 71. Blood pressures have been elevated for the past week. Denies pain, weakness, or visual disturbances. Dr. Ellyn Hack advised. Order for losartan '50mg'$  today, if SBP more than 160  this evening at bedtime, take two carvedilol tablets, then each day, take losartan '25mg'$ . Patient will check BP 2 hours after taking losartan and keep a diary. Patient wrote this down and repeated back to me. New prescription for losartan sent to patient's pharmacy.

## 2022-08-01 NOTE — Telephone Encounter (Signed)
Pt c/o BP issue: STAT if pt c/o blurred vision, one-sided weakness or slurred speech  1. What are your last 5 BP readings? 200/100  2. Are you having any other symptoms (ex. Dizziness, headache, blurred vision, passed out)? No   3. What is your BP issue? Extremely high BP readings

## 2022-08-09 ENCOUNTER — Telehealth: Payer: Self-pay | Admitting: Cardiology

## 2022-08-09 MED ORDER — LOSARTAN POTASSIUM 50 MG PO TABS: 50.0000 mg | ORAL_TABLET | Freq: Every day | ORAL | 6 refills | Status: DC

## 2022-08-09 NOTE — Telephone Encounter (Signed)
Spoke to patient's wife. Mrs Kerrigan and patient concerned about patient's blood pressure. She states it concerns her because patient was recently in blood pressure.  She states his blood pressure 160/67 and 161/79 after medication was given Losartan, Carvediolol -morning dose.   Yesterday reading in the morning 156/84 ,evening 120/69 ,  day before  morning 146/70 , evening 139/64    Recent  phone call on 08/01/22 - when patient blood pressure was elevated -  per Dr Ellyn Hack , the patient took  50 mg of losartan ( one time dose total)  ,if blood pressure  is greater than 248 systolic that evening /bedtime then he would take 12.5 mg of carvedilol ( double his regular dose) , Continue taking Losartan 25 mg daily.    Informed wife will defer to Dr Ellyn Hack and Doctor of the day.

## 2022-08-09 NOTE — Telephone Encounter (Signed)
Pt c/o BP issue: STAT if pt c/o blurred vision, one-sided weakness or slurred speech  1. What are your last 5 BP readings? 160/67; 161/79  2. Are you having any other symptoms (ex. Dizziness, headache, blurred vision, passed out)? no  3. What is your BP issue?   Wife wants to know if she should give him a extra dosage of carvedilol (COREG) 6.25 MG tablet

## 2022-08-09 NOTE — Telephone Encounter (Signed)
Increase Losartan to 50 mg standing dose  [facility]

## 2022-08-09 NOTE — Telephone Encounter (Signed)
Called wife and informed her of Dr Allison Quarry instructions Medication sent to pharmacy - wife wanted a 30 day supply - done.

## 2022-08-26 DIAGNOSIS — E785 Hyperlipidemia, unspecified: Secondary | ICD-10-CM | POA: Diagnosis not present

## 2022-08-26 DIAGNOSIS — I10 Essential (primary) hypertension: Secondary | ICD-10-CM | POA: Diagnosis not present

## 2022-08-26 DIAGNOSIS — E1169 Type 2 diabetes mellitus with other specified complication: Secondary | ICD-10-CM | POA: Diagnosis not present

## 2022-08-26 DIAGNOSIS — I2510 Atherosclerotic heart disease of native coronary artery without angina pectoris: Secondary | ICD-10-CM | POA: Diagnosis not present

## 2022-09-02 ENCOUNTER — Ambulatory Visit: Payer: Medicare Other | Attending: Cardiology | Admitting: Cardiology

## 2022-09-02 ENCOUNTER — Encounter: Payer: Self-pay | Admitting: Cardiology

## 2022-09-02 VITALS — BP 153/71 | HR 46 | Ht 67.0 in | Wt 178.0 lb

## 2022-09-02 DIAGNOSIS — I10 Essential (primary) hypertension: Secondary | ICD-10-CM

## 2022-09-02 DIAGNOSIS — I5042 Chronic combined systolic (congestive) and diastolic (congestive) heart failure: Secondary | ICD-10-CM

## 2022-09-02 DIAGNOSIS — I255 Ischemic cardiomyopathy: Secondary | ICD-10-CM

## 2022-09-02 DIAGNOSIS — I4891 Unspecified atrial fibrillation: Secondary | ICD-10-CM

## 2022-09-02 DIAGNOSIS — I2510 Atherosclerotic heart disease of native coronary artery without angina pectoris: Secondary | ICD-10-CM

## 2022-09-02 DIAGNOSIS — E785 Hyperlipidemia, unspecified: Secondary | ICD-10-CM

## 2022-09-02 DIAGNOSIS — I63532 Cerebral infarction due to unspecified occlusion or stenosis of left posterior cerebral artery: Secondary | ICD-10-CM

## 2022-09-02 DIAGNOSIS — I2109 ST elevation (STEMI) myocardial infarction involving other coronary artery of anterior wall: Secondary | ICD-10-CM

## 2022-09-02 MED ORDER — CARVEDILOL 3.125 MG PO TABS: 3.1250 mg | ORAL_TABLET | Freq: Two times a day (BID) | ORAL | 11 refills | Status: DC

## 2022-09-02 MED ORDER — LOSARTAN POTASSIUM 100 MG PO TABS: 100.0000 mg | ORAL_TABLET | Freq: Every day | ORAL | 11 refills | Status: DC

## 2022-09-02 NOTE — Progress Notes (Signed)
Primary Care Provider: [name], [place] Cardiologist: Glenetta Hew, MD Electrophysiologist: None  Clinic Note: Chief Complaint  Patient presents with   Follow-up    At 3 months.  Has been 1 year since I saw him.   Coronary Artery Disease    No angina   Cerebrovascular Accident    Had a stroke between last visit now.  Reviewed hospitalization and procedures.   Cardiomyopathy    No notable CHF symptoms.    ===================================  ASSESSMENT/PLAN   Problem List Items Addressed This Visit       Cardiology Problems   ST elevation myocardial infarction (STEMI) of anterior wall, subsequent episode of care [facility]) (Chronic)    Distant history of large anterior infarct with significant anterior wall akinesis.  Subsequently suffered a stroke after his MI.  Had not really had any further anginal symptoms.  EF is low, but CHF has not been significant either mostly because he is quite debilitated from hip and back pain that limits his activity..      Relevant Medications   carvedilol (COREG) 3.125 MG tablet   losartan (COZAAR) 100 MG tablet   Coronary artery disease involving native coronary artery of native heart without angina pectoris - Primary (Chronic)    No longer having any anginal symptoms. Difficult to tell what is going on as far as his pressures go.  I am little bit concerned about his bradycardia however. Lipids seem to be well-controlled and A1c was 6.0.  Plan: Reduce carvedilol dose to 3.125 mg twice daily and increase losartan to twice his current dose which is listed as 50 mg daily to 100 mg daily. Continue ASA/Plavix DAPT for CAD and carotid disease.  Would like to have at least 1 year uninterrupted if possible post CVA Continue rosuvastatin with well-controlled lipids.  Dietary adjustments made a major impact. Encourage exercise.      Relevant Medications   carvedilol (COREG) 3.125 MG tablet   losartan (COZAAR) 100 MG  tablet   Cardiomyopathy, ischemic: EF most recently 35-40%) (Chronic)    Echo malls of 30 to 35%.  Probably of 35 accurate.  Anterior wall.  Blood pressures in the past did not tolerate Entresto.  However now we are ramping up his losartan dose.  Due to bradycardia backing down on his carvedilol.  Plan will be to take carvedilol down to 3.125 mg twice daily and increase losartan to what appears to be on a daily basis listed medication of 50 mg daily.  He seems to pretty euvolemic taking his Lasix pretty much just as needed which is not very frequent.  Monitor glycemic control.  Level of threshold to consider SGLT2 number.      Relevant Medications   carvedilol (COREG) 3.125 MG tablet   losartan (COZAAR) 100 MG tablet   Other Relevant Orders   EKG 12-Lead (Completed)   Hyperlipidemia with target LDL less than 70 (Chronic)    Labs from September look great on simvastatin.  Have not been checked recently which he did probably due to be checked in March timeframe by PCP. He has had weight loss with significant dietary adjustments which may very well have been added benefit.      Relevant Medications   carvedilol (COREG) 3.125 MG tablet   losartan (COZAAR) 100 MG tablet   Essential hypertension (Chronic)    Blood pressure is high today which is unusual for.  I am little more concerned about the bradycardia.  Plan will be  to reduce carvedilol dose and increase ARB dose.  Follow-up blood pressures to see how he stabilizes.      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  Fatigue being more because of just immobility and deconditioning..      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  Had a TCAR of the left carotid with stable findings on repeat Dopplers.  Now on DAPT which we can probably continue long-term unless he has bleeding issues.  Would defer to vascular surgery, but cardiac recommendations will be at least 1 year uninterrupted DAPT before holding for procedures or surgeries.  Aggressive glycemic control warranted, follow sugars as his most recent A1c was 6.0. Lipids are well-controlled on rosuvastatin with noted weight loss and dietary adjustments.      Relevant Orders   EKG 12-Lead (Completed)   Other Visit Diagnoses     Atrial fibrillation, unspecified type ([place])       Relevant Medications   carvedilol (COREG) 3.125 MG tablet   losartan (COZAAR) 100 MG tablet   Other Relevant Orders   EKG 12-Lead (Completed)       ===================================  HPI:    Damon Walker is a 77 y.o. male with a PMH below who presents today for 66-monthfollow-up. He returns at the request of MLondon Pepper MD.  CV History: ANTERIOR STEMI --> LAD PCI in October 2016 --  followed by CVA  (L Occipital Lobe)- => R eye vision loss. ISCHEMIC CARDIOMYOPATHY (EF 30 to 35%) with CHRONIC COMBINED SYSTOLIC AND DIASTOLIC HEART FAILURE - Class I-II., => Declined ICD Unable to tolerare Entresto L-Sided CVA September 2023- s/p L TCAR (04/30/22) = residual expressive aphasia.  Hypertension, Hyperlipidemia and Stage IIIa Chronic Kidney Disease, Known R Vert A occlusion ,   I last saw him in Jan 2023.  He was doing relatively well from a cardiac standpoint.  He had [place] a lot out of him.  He noted significant reduced stamina.  Will try to get back into walking some.  Is already some claudication symptoms with walking.  Walks with engrossed or leaning on a shopping cart.  No real symptoms of chest pain or pressure.  No PND orthopnea but trivial edema.  He was very deconditioned, therefore he did note some exertional dyspnea, but  mostly limited by musculoskeletal pain. => ABIs ordered. ; due to low BP - Losartan PRN.   Recent Hospitalizations:  04/21/2022: onset of confusion and speech difficulty.  Patient found to have left parietal stroke (L MCA) likely from L ICA stenosis/plaque and recommended 30-day CardioNet monitoring to rule out A-fib.  Dr. DScot Dockrecommended intervention TCAR on 09/26.  9 x 40 stent Soft blood pressures, Coreg was decreased to 6.25 mg twice daily.  He continued be limited by cognitive deficits with weakness and CIR was recommended due to functional decline.   D/c from CIR 9/27  Neuro f/u 05/07/22 - pt reported being "back to baseline w/ exception of speech".   Still with work difficulty - but wife noted significant improvement.   WMabry Tiftwas last seen on 05/29/2022 by JCaron Presume PA for post-hospital f/u from  CVA. Noted good recovery from CVA - no residual physical deficits, but still with memory & word finding / pronunciation issues.  On DAPT with ASA/Plavix.  Home BP ranges 100-120s/50-60s on Coreg 6.25 mg BID.  Lasix & Losartan removed. No SSx of palpitations, CP, PND/orthopnea, edema.  => 30 D monitor ordered.   Reviewed  CV studies:    The following studies were reviewed today: (if available, images/films reviewed: From Epic Chart or Care Everywhere)  BLE Arterial Dopplers: ABIs suggest mild RLE Arterial disese & no signifiant LLE Arterial disease.  Echo 04/22/2022: EF 30 to 35% with global HK.  GR 1 DD.  Apical and anterior wall akinesis.  Normal RV with RV P.  Normal RAP.  Normal AOV and MV.  F/u Carotid Dopplers 06/13/2022: (s/p LTCAR): 1-39% Bilateral ICA. < 50% Bilateral CCA. >50% Bilateral ECA.  R Vert A not visualized. L Vert A antegrade flow.  Bilateral clavian artery flow restored.  14 D Zio (Oct-Nov 2023): Predominantly SR w/ 1  AVB & IVCD, HR range 43-118 bpm. Avg 66 bpm.  3 PVC runs/NSVT (8-12 beats). 12 Atrial Runs 4-8 beats.  Otherwise rare PACs PVCs.  No  sustained arrhythmias.  Most notably no A-fib.  Interval History:   Lorne Winkels returns here today along with his wife.  Quite a significant turn events since his last visit.  We reviewed stroke TCAR etc.  He says he is doing a lot better.  No physical issues.  His speech is much better but still has some word finding issues and expressive aphasia symptoms.  Memory is definitely improving but still down little bit. His wife is little perplexed because his blood pressure today is not consistent with his home pressure readings.  Usually they are much more consistent with what he told Caron Presume, PA-SBP's in the 120s for the most part.  He does not have a lot of energy is all that active.  But denies any real chest pain or pressure exertion.  No PND orthopnea with trivial edema.  Since the stroke, and they have made significant changes again.  Cut out sweets and ice cream and cookies etc.  He is lost about 14 pounds since I last saw him.  Denies any rapid irregular beats or palpitations.  No syncope or near CP.  No recurrent TIA/CVA/or emesis fugax symptoms since.    REVIEWED OF SYSTEMS   Review of Systems  Constitutional:  Positive for malaise/fatigue (He does not have a lot of energy.  Not a lot of get up and go but no residual weakness.) and weight loss (Dramatic changes to his diet since his stroke.).  HENT:  Positive for hearing loss. Negative for congestion and sinus pain.   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   Gastrointestinal:  Negative for abdominal pain, blood in stool, diarrhea and melena.  Genitourinary:  Negative for hematuria.  Musculoskeletal:  Positive for joint pain (Still has back and hip pain.). Negative for falls and myalgias.  Neurological:  Positive for dizziness (Sometimes because of his vision issues and hearing loss.), speech change (Speech is getting better but still has no aphasia and  word finding issues.  More of a pronunciation concern.), weakness (Both legs get weak, probably because of hip pain.) and headaches (Occasionally).  Psychiatric/Behavioral:  Positive for memory loss (He definitely has worsening memory loss after stroke, but it is mainly  stable now.). Negative for depression (Probably more dysthymia.). The patient is not nervous/anxious.    I have reviewed and (if needed) personally updated the patient's problem list, medications, allergies, past medical and surgical history, social and family history.   PAST MEDICAL HISTORY   Past Medical History:  Diagnosis Date   Carotid arterial disease ([place]) 06/2014; 01/09/2015   Followed by Dr. Donnetta Hutching of [facility] -- a) Carotid u/s: 40-59% bilat ICA stenosis; b) 10/2019: Carotid Dopplers October 20, 3218: R ICA 2-54%, LICA 27-06%.  Right vertebral artery appears occluded.  Normal subclavian flow.  Normal left vertebral artery. -->  No change since 2020   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 ([place]) 05/14/2015   -- Given TPA.  MRI Brain: Acute L Occipital Lobe Infarct. Chronic microvascular ischemic changes in the white matter and left pons.  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   Chronic combined systolic and diastolic congestive heart failure, NYHA class 2 ([place]) 05/11/2015 - 05/15/15   a. EF 20-25% with anterior-anteroseptal akinesis (immediately post anterior STEMI).  ; b. 10/10/'16 Echo: EF 35-40% with moderate mid-apical anteroseptal, anterior and apical hypokinesis.   Coronary artery disease involving native heart 05/11/2015   Cath: 3V CAD with 90% prox RCA, 80% mid RCA, 80% OM3, (RCA and OM residual treated medically) 100% LAD - PCI to Prox-Mid LAD with Overlapping Promus Premier DES 3.0 x 38 & 3.0 x 16 (post-dilated to 3.5 mm)    Essential  hypertension    H/O: GI bleed 05/15/2015   s/p Sigmoid Polypectomy 05/04/2015 - prior to STEMI on May 11, 2015; Had GI Bleed following TPA for CVA, while on ASA & Brilinta; Flex Sig - 1. Internal Hemorrhoids, 2. Distal sigmoid polypectomy site with flat [place] status post biopsy and tattoo with 23m spot over 3 injections 3. Proximal sigmoid polypectomy site seen as well, 4) otherwise normal with no evidence of bleed   Hyperlipidemia with target LDL less than 70    Ischemic cardiomyopathy 05/11/2015   EF 40% on cath 05/11/2015 after anterior STEMI, EF 20-25% on echo 05/13/2015; 12/2015 =  EF ~45%.   Liver disease, chronic, with cirrhosis ([place] 11/2019   Mild possible early cirrhotic liver disease noted on CT scan.   Prediabetes Oct 2016   A1C 6.0 in Oct 2016   ST elevation myocardial infarction (STEMI) involving left anterior descending (LAD) coronary artery with complication ([place] 123/02/6282  100% Prox LAD - PCI with overlapping DES x 2   Tobacco abuse    a. 30 yrs - 1.5 ppd. - Quit 05/11/2015   Type 2 diabetes mellitus with other specified complication ([place] 31/51/7616  Vision abnormalities    Right hemianopsia    PAST SURGICAL HISTORY   Past Surgical History:  Procedure Laterality Date   CARDIAC CATHETERIZATION N/A 05/11/2015   Procedure: Left Heart Cath and Coronary Angiography;  Surgeon: Peter M JMartinique MD;  [facility];  Service: Cardiovascular;  100% pLAD (long lesion). RCA - prox 90%, mid 80%. OM2 & OM3 80% (OM2 & 3 not necessarily PCI targets); EF 35-45% with Anterio HK   CARDIAC CATHETERIZATION N/A 05/11/2015   Procedure: Coronary Stent Intervention;  Surgeon: Peter M JMartinique MD;  [facility];  Service: Cardiovascular; PCI to Prox-Mid LAD with Overlapping Promus Premier DES 3.0 x 38 & 3.0 x  16 (post-dilated to 3.5 mm)     FLEXIBLE SIGMOIDOSCOPY N/A 05/18/2015   Procedure: FLEXIBLE SIGMOIDOSCOPY;  Surgeon: Clarene Essex, MD;  Location: [facility] ENDOSCOPY;   Service: Endoscopy;  Laterality: N/A;   NM MYOVIEW LTD  08/2016   EF 30% with large anterior-anteroseptal and apical infarct consistent with LAD infarct. His HIGH RISK because of large infarct. No ischemia.   S/P Appendectomy     Age 28   S/P Inguinal Hernia Repair     In his 20's.   TRANSCAROTID ARTERY REVASCULARIZATION  Left 04/30/2022   Procedure: Transcarotid Artery Revascularization;  Surgeon: Angelia Mould, MD;  Location[facility];  Service: Vascular;  Laterality: Left;   TRANSTHORACIC ECHOCARDIOGRAM  05/15/2015   Mild LVH, EF 35-40% - Mod HK of mid-apical anteroseptal, anterior & apical walls.  Gr 1 DD. Mild bilateral Atrial Enlargement.   TRANSTHORACIC ECHOCARDIOGRAM  12/2015   EF up to ~40-45% wiht Anterior-Anteroapical HK.  Mod LV dilation with increased LVEDP.   TRANSTHORACIC ECHOCARDIOGRAM  12/'17; 4/'18   a) EF ~30-35% (in setting of HTN Urgency). - Gr 2 DD;; b) EF 30-35% (per Dr. Sallyanne Kuster - 35-40%). Anterior-anteroapical Akinesis. Gr2 DD w/ high filling pressures. Mild LA dilation. Mild RV dilation.   ULTRASOUND GUIDANCE FOR VASCULAR ACCESS Right 04/30/2022   Procedure: ULTRASOUND GUIDANCE FOR VASCULAR ACCESS;  Surgeon: Angelia Mould, MD;  Location: [facility];  Service: Vascular;  Laterality: Right;    Immunization History  Administered Date(s) Administered   Influenza Split 06/22/2015   Influenza,inj,Quad PF,6+ Mos 06/22/2015, 05/01/2016, 05/21/2018, 06/02/2019   Influenza-Unspecified 06/15/2021   Moderna Sars-Covid-2 Vaccination 10/03/2019, 11/06/2019, 07/01/2020, 12/19/2020   Pfizer Covid-19 Vaccine Bivalent Booster 26yr & up 06/05/2021   Pneumococcal Conjugate-13 07/20/2014   Pneumococcal Polysaccharide-23 05/01/2016    MEDICATIONS/ALLERGIES     Current Meds  Medication Sig   acetaminophen (TYLENOL) 325 MG tablet Take 1-2 tablets (325-650 mg total) by mouth every 4 (four) hours as needed for mild pain.   aspirin EC 81 MG tablet Take 1 tablet (81 mg  total) by mouth daily.   carvedilol (COREG) 6.25 MG tablet Take 1 tablet (6.25 mg total) by mouth 2 (two) times daily with a meal.   clopidogrel (PLAVIX) 75 MG tablet Take 1 tablet (75 mg total) by mouth daily.   famotidine (PEPCID) 40 MG tablet Take 1 tablet (40 mg total) by mouth every other day.   ferrous sulfate 325 (65 FE) MG tablet Take 1 tablet (325 mg total) by mouth daily with breakfast.   furosemide (LASIX) 20 MG tablet Take 20 mg by mouth as needed for fluid or edema (weight gain of 3 lbs in a day and 5 lbs in a week). Take 1 tablet as needed for weight gain of 3 lbs in a day and 5 lbs in a week or swelling   losartan (COZAAR) 50 MG tablet Take 1 tablet (50 mg total) by mouth daily.   nitroGLYCERIN (NITROSTAT) 0.4 MG SL tablet Place 1 tablet (0.4 mg total) under the tongue every 5 (five) minutes x 3 doses as needed for chest pain.   pantoprazole (PROTONIX) 20 MG tablet Take 1 tablet (20 mg total) by mouth every other day.   rosuvastatin (CRESTOR) 40 MG tablet Take 1 tablet (40 mg total) by mouth daily at 6 PM.   sertraline (ZOLOFT) 25 MG tablet Take 0.5 tablets (12.5 mg total) by mouth at bedtime.   traZODone (DESYREL) 50 MG tablet Take 1 tablet (50 mg total) by mouth  at bedtime.    Allergies  Allergen Reactions   Latex Hives, Itching, Rash and Other (See Comments)    Blisters    SOCIAL HISTORY/FAMILY HISTORY   Reviewed in Epic:  Pertinent findings:  Social History   Tobacco Use   Smoking status: Former    Packs/day: 1.00    Years: 30.00    Total pack years: 30.00    Types: Cigarettes    Quit date: 04/06/2015    Years since quitting: 7.4    Passive exposure: Current (Wife is a smoker)   Smokeless tobacco: Never   Tobacco comments:    Quit the day of his STEMI  Substance Use Topics   Alcohol use: No    Alcohol/week: 0.0 standard drinks of alcohol   Drug use: No   Social History   Social History Narrative   Lives in [place] with wife. Originally from [place].     OBJCTIVE -PE, EKG, labs   Wt Readings from Last 3 Encounters:  09/02/22 178 lb (80.7 kg)  06/13/22 184 lb (83.5 kg)  05/29/22 181 lb (82.1 kg)  08/2021 - 192 lb  Physical Exam: BP (!) 153/71   Pulse (!) 46   Ht '5\' 7"'$  (1.702 m)   Wt 178 lb (80.7 kg)   SpO2 99%   BMI 27.88 kg/m  Physical Exam Vitals reviewed.  Constitutional:      General: He is not in acute distress.    Appearance: He is normal weight. He is ill-appearing (Mild chronic ill appearance.). He is not toxic-appearing.     Comments: Well-groomed.  HENT:     Head: Normocephalic and atraumatic.     Ears:     Comments: Hard of hearing Eyes:     Comments: R eye blind  Neck:     Vascular: No carotid bruit (Left TCAR scar well-healed.  No longer has bruit.).  Cardiovascular:     Rate and Rhythm: Regular rhythm. Bradycardia present. Extrasystoles are present.    Chest Wall: PMI is not displaced.     Pulses: Decreased pulses (Decreased bilateral pedal pulses.).     Heart sounds: S1 normal and S2 normal. Heart sounds are distant. No murmur heard.    No friction rub. No gallop.  Pulmonary:     Effort: Pulmonary effort is normal.     Breath sounds: No wheezing, rhonchi or rales.  Chest:     Chest wall: No tenderness.  Musculoskeletal:        General: No swelling. Normal range of motion.     Cervical back: Normal range of motion and neck supple.  Neurological:     General: No focal deficit present.     Mental Status: He is alert and oriented to person, place, and time.     Gait: Gait abnormal.  Psychiatric:     Comments: Seems a little down.  Back to being more shy and demure.  Somewhat poor historian.  Defers to his wife a lot.     Adult ECG Report  Rate: 46 ;  Rhythm: sinus bradycardia and cannot exclude anteroseptal MI, age-indeterminate.  T wave abnormalities suggestive for ischemia. ;   Narrative Interpretation: Stable findings.    Recent Labs:   Reviewed Lab Results  Component Value Date   CHOL 86  05/01/2022   HDL 22 (L) 05/01/2022   LDLCALC 43 05/01/2022   TRIG 104 05/01/2022   CHOLHDL 3.9 05/01/2022   Lab Results  Component Value Date   CREATININE 1.21 05/06/2022  BUN 15 05/06/2022   NA 140 05/06/2022   K 4.0 05/06/2022   CL 109 05/06/2022   CO2 25 05/06/2022      Latest Ref Rng & Units 05/06/2022    6:59 AM 05/02/2022    6:49 AM 05/01/2022    6:35 AM  CBC  WBC 4.0 - 10.5 K/uL 8.6  7.9  12.1   Hemoglobin 13.0 - 17.0 g/dL 12.4  12.0  11.8   Hematocrit 39.0 - 52.0 % 37.0  35.1  34.3   Platelets 150 - 400 K/uL 272  197  210     Lab Results  Component Value Date   HGBA1C 5.8 (H) 04/22/2022   Lab Results  Component Value Date   TSH 1.006 05/11/2015    ================================================== I spent a total of 36 minutes with the patient spent in direct patient consultation.  Additional time spent with chart review  / charting (studies, outside notes, etc): 33 min => Significant episodes since his last visit.  Stroke and procedures performed.  Multiple notes and procedures reviewed.  I have Total Time: 69 min  Current medicines are reviewed at length with the patient today.  (+/- concerns) none  Notice: This dictation was prepared with Dragon dictation along with smart phrase technology. Any transcriptional errors that result from this process are unintentional and may not be corrected upon review.  Studies Ordered:   Orders Placed This Encounter  Procedures   EKG 12-Lead   Meds ordered this encounter  Medications   carvedilol (COREG) 3.125 MG tablet    Sig: Take 1 tablet (3.125 mg total) by mouth 2 (two) times daily.    Dispense:  60 tablet    Refill:  11    Discontinue 6.25 mg dose   losartan (COZAAR) 100 MG tablet    Sig: Take 1 tablet (100 mg total) by mouth daily.    Dispense:  30 tablet    Refill:  11    Discontinue 50 mg    Patient Instructions / Medication Changes & Studies & Tests Ordered   Patient Instructions  Medication  Instructions:   Increase Losartan  100 mg  ( 2 x 50 mg)  daily   Decrease Carvedilol 3.125 mg  twice a day    *If you need a refill on your cardiac medications before your next appointment, please call your pharmacy*   Lab Work:  Not needed   Testing/Procedures:  Not needed   Follow-Up: At [facility], you and your health needs are our priority.  As part of our continuing mission to provide you with exceptional heart care, we have created designated Provider Care Teams.  These Care Teams include your primary Cardiologist (physician) and Advanced Practice Providers (APPs -  Physician Assistants and Nurse Practitioners) who all work together to provide you with the care you need, when you need it.     Your next appointment:   6 month(s)  The format for your next appointment:   In Person  Provider:   Caron Presume, PA-C    Then, Glenetta Hew, MD will plan to see you again in 12 month(s).     Leonie Man, MD, MS Glenetta Hew, M.D., M.S. Interventional Cardiologist  [place]  Pager # [phone] Phone # [phone] [address] [place], [place] [address]   Thank you for choosing [facility] [place]!!

## 2022-09-02 NOTE — Patient Instructions (Addendum)
Medication Instructions:   Increase Losartan  100 mg  ( 2 x 50 mg)  daily   Decrease Carvedilol 3.125 mg  twice a day    *If you need a refill on your cardiac medications before your next appointment, please call your pharmacy*   Lab Work:  Not needed   Testing/Procedures:  Not needed   Follow-Up: At [facility], you and your health needs are our priority.  As part of our continuing mission to provide you with exceptional heart care, we have created designated Provider Care Teams.  These Care Teams include your primary Cardiologist (physician) and Advanced Practice Providers (APPs -  Physician Assistants and Nurse Practitioners) who all work together to provide you with the care you need, when you need it.     Your next appointment:   6 month(s)  The format for your next appointment:   In Person  Provider:   Caron Presume, PA-C    Then, Glenetta Hew, MD will plan to see you again in 12 month(s).

## 2022-09-08 ENCOUNTER — Encounter: Payer: Self-pay | Admitting: Cardiology

## 2022-09-08 NOTE — Assessment & Plan Note (Signed)
Labs from September look great on simvastatin.  Have not been checked recently which he did probably due to be checked in March timeframe by PCP. He has had weight loss with significant dietary adjustments which may very well have been added benefit.

## 2022-09-08 NOTE — Assessment & Plan Note (Signed)
Recovering relatively well.  Had a TCAR of the left carotid with stable findings on repeat Dopplers.  Now on DAPT which we can probably continue long-term unless he has bleeding issues.  Would defer to vascular surgery, but cardiac recommendations will be at least 1 year uninterrupted DAPT before holding for procedures or surgeries.  Aggressive glycemic control warranted, follow sugars as his most recent A1c was 6.0. Lipids are well-controlled on rosuvastatin with noted weight loss and dietary adjustments.

## 2022-09-08 NOTE — Assessment & Plan Note (Signed)
Echo malls of 30 to 35%.  Probably of 35 accurate.  Anterior wall.  Blood pressures in the past did not tolerate Entresto.  However now we are ramping up his losartan dose.  Due to bradycardia backing down on his carvedilol.  Plan will be to take carvedilol down to 3.125 mg twice daily and increase losartan to what appears to be on a daily basis listed medication of 50 mg daily.  He seems to pretty euvolemic taking his Lasix pretty much just as needed which is not very frequent.  Monitor glycemic control.  Level of threshold to consider SGLT2 number.

## 2022-09-08 NOTE — Assessment & Plan Note (Signed)
Blood pressure is high today which is unusual for.  I am little more concerned about the bradycardia.  Plan will be to reduce carvedilol dose and increase ARB dose.  Follow-up blood pressures to see how he stabilizes.

## 2022-09-08 NOTE — Assessment & Plan Note (Signed)
No longer having any anginal symptoms. Difficult to tell what is going on as far as his pressures go.  I am little bit concerned about his bradycardia however. Lipids seem to be well-controlled and A1c was 6.0.  Plan: Reduce carvedilol dose to 3.125 mg twice daily and increase losartan to twice his current dose which is listed as 50 mg daily to 100 mg daily. Continue ASA/Plavix DAPT for CAD and carotid disease.  Would like to have at least 1 year uninterrupted if possible post CVA Continue rosuvastatin with well-controlled lipids.  Dietary adjustments made a major impact. Encourage exercise.

## 2022-09-08 NOTE — Assessment & Plan Note (Signed)
Really NYHA class II symptoms.  Fatigue being more because of just immobility and deconditioning.Marland Kitchen

## 2022-09-08 NOTE — Assessment & Plan Note (Signed)
Distant history of large anterior infarct with significant anterior wall akinesis.  Subsequently suffered a stroke after his MI.  Had not really had any further anginal symptoms.  EF is low, but CHF has not been significant either mostly because he is quite debilitated from hip and back pain that limits his activity.Damon Walker

## 2022-11-22 DIAGNOSIS — E1169 Type 2 diabetes mellitus with other specified complication: Secondary | ICD-10-CM | POA: Diagnosis not present

## 2022-11-22 DIAGNOSIS — I130 Hypertensive heart and chronic kidney disease with heart failure and stage 1 through stage 4 chronic kidney disease, or unspecified chronic kidney disease: Secondary | ICD-10-CM | POA: Diagnosis not present

## 2022-11-22 DIAGNOSIS — N1831 Chronic kidney disease, stage 3a: Secondary | ICD-10-CM | POA: Diagnosis not present

## 2022-11-22 DIAGNOSIS — I10 Essential (primary) hypertension: Secondary | ICD-10-CM | POA: Diagnosis not present

## 2022-12-11 ENCOUNTER — Ambulatory Visit: Payer: Medicare Other | Admitting: Adult Health

## 2022-12-11 ENCOUNTER — Encounter: Payer: Self-pay | Admitting: Adult Health

## 2022-12-11 VITALS — BP 108/56 | HR 63 | Ht 67.0 in | Wt 188.0 lb

## 2022-12-11 DIAGNOSIS — I10 Essential (primary) hypertension: Secondary | ICD-10-CM

## 2022-12-11 DIAGNOSIS — E785 Hyperlipidemia, unspecified: Secondary | ICD-10-CM | POA: Diagnosis not present

## 2022-12-11 DIAGNOSIS — Z8673 Personal history of transient ischemic attack (TIA), and cerebral infarction without residual deficits: Secondary | ICD-10-CM

## 2022-12-11 NOTE — Progress Notes (Signed)
PATIENT: Damon Walker DOB: 10-Nov-1945  REASON FOR VISIT: follow up HISTORY FROM: patient PRIMARY NEUROLOGIST: Dr. Pearlean Brownie  Chief Complaint  Patient presents with   Follow-up    Pt in 19 with wife Pt here for stroke f/u Pt states no questions or concerns for today's visit      HISTORY OF PRESENT ILLNESS: Today 12/11/22:  Damon Walker is a 77 y.o. male with a history of L MCA infarct. Returns today for follow-up.  Overall the patient states that he is doing well.  He remains on aspirin and Plavix.  Still has trouble with expressive aphasia.  Denies any weakness in the extremities.  No change in his gait or balance.  He had a follow-up appointment with cardiology in January.  He will see vascular surgeon at the end of the year.  Remains on a statin for cholesterol.  He returns today for an evaluation.     05/27/22: Damon Walker is a 77 y.o. male who has been followed in this office for L MCA infarct. Returns today for follow-up.  He is here today with his wife.  Overall he feels that he is back to his baseline with the exception of his speech. Still notice trouble with speech. Hard time pronouncing words. Sometimes has trouble getting his words out. WIfe reports that he has improved greatly since the hospital.  He is currently on aspirin and Plavix.  Had vascular surgery with Dr. Lynford Humphrey and will have follow-up this week.  Has not wore cardiac monitor yet.  HISTORY Damon Walker is a 77 y.o. 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 Initial NIH 8. Patient had a rapid response on 9/18 and was emergently transferred to ICU and placed on bipap with improvement of respiratory status.  Patient awake today, much more cooperative, able to answer questions with  encouragement. Speech is more spontaneous, intermittently asks " what do you want me to do?".    Stroke:  Acute ischemic L MCA infarct, etiology likely from left ICA stenosis/plaque  Code Stroke CT head No acute abnormality.  CTA head & neck 50% diameter stenosis proximal left internal carotid artery. Diminutive right vertebral artery which appears diffusely diseased and has minimal contribution to the basilar. MRI  Acute infarct left parietal lobe without hemorrhage 2D Echo LVEF 30-35%  CUS left ICA 40-59% stenosis Will need 30-day CardioNet monitoring to rule out A-fib as outpatient LDL 69 HgbA1c 5.8 VTE prophylaxis - SCDs clopidogrel 75 mg daily prior to admission, now on Aspirin 81 mg and clopidogrel 75 mg  REVIEW OF SYSTEMS: Out of a complete 14 system review of symptoms, the patient complains only of the following symptoms, and all other reviewed systems are negative.  ALLERGIES: Allergies  Allergen Reactions   Latex Hives, Itching, Rash and Other (See Comments)    Blisters    HOME MEDICATIONS: Outpatient Medications Prior to Visit  Medication Sig Dispense Refill   acetaminophen (TYLENOL) 325 MG tablet Take 1-2 tablets (325-650 mg total) by mouth every 4 (four) hours as needed for mild pain.     aspirin EC 81 MG tablet Take 1 tablet (81 mg total) by mouth daily. 30 tablet 12   carvedilol (COREG) 3.125 MG tablet Take 1 tablet (3.125 mg total) by mouth 2 (two) times daily. 60 tablet 11  clopidogrel (PLAVIX) 75 MG tablet Take 1 tablet (75 mg total) by mouth daily. 30 tablet 0   famotidine (PEPCID) 40 MG tablet Take 1 tablet (40 mg total) by mouth every other day. 15 tablet 0   ferrous sulfate 325 (65 FE) MG tablet Take 1 tablet (325 mg total) by mouth daily with breakfast. 30 tablet 0   furosemide (LASIX) 20 MG tablet Take 20 mg by mouth as needed for fluid or edema (weight gain of 3 lbs in a day and 5 lbs in a week). Take 1 tablet as needed for weight gain of 3 lbs in a day and 5  lbs in a week or swelling     losartan (COZAAR) 100 MG tablet Take 1 tablet (100 mg total) by mouth daily. 30 tablet 11   nitroGLYCERIN (NITROSTAT) 0.4 MG SL tablet Place 1 tablet (0.4 mg total) under the tongue every 5 (five) minutes x 3 doses as needed for chest pain. 25 tablet 3   pantoprazole (PROTONIX) 20 MG tablet Take 1 tablet (20 mg total) by mouth every other day. 15 tablet 0   rosuvastatin (CRESTOR) 40 MG tablet Take 1 tablet (40 mg total) by mouth daily at 6 PM. 30 tablet 0   sertraline (ZOLOFT) 25 MG tablet Take 0.5 tablets (12.5 mg total) by mouth at bedtime. 15 tablet 0   traZODone (DESYREL) 50 MG tablet Take 1 tablet (50 mg total) by mouth at bedtime. 30 tablet 0   No facility-administered medications prior to visit.    PAST MEDICAL HISTORY: Past Medical History:  Diagnosis Date   Carotid arterial disease (HCC) 06/2014; 01/09/2015   Followed by Dr. Arbie Cookey of [facility] -- a) Carotid u/s: 40-59% bilat ICA stenosis; b) 10/2019: Carotid Dopplers October 20, 2019: R ICA 1-39%, LICA 40-59%.  Right vertebral artery appears occluded.  Normal subclavian flow.  Normal left vertebral artery. 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  MRI Brain: Acute L Occipital Lobe Infarct. Chronic microvascular ischemic changes in the white matter and left pons.  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   Chronic combined systolic and diastolic congestive heart failure, NYHA class 2 (HCC) 05/11/2015 - 05/15/15   a. EF 20-25% with anterior-anteroseptal akinesis (immediately post anterior STEMI).  ; b. 10/10/'16 Echo: EF 35-40% with moderate mid-apical anteroseptal, anterior and apical hypokinesis.   Coronary artery disease involving native heart 05/11/2015   Cath: 3V CAD with  90% prox RCA, 80% mid RCA, 80% OM3, (RCA and OM residual treated medically) 100% LAD - PCI to Prox-Mid LAD with Overlapping Promus Premier DES 3.0 x 38 & 3.0 x 16 (post-dilated to 3.5 mm)    Essential hypertension    H/O: GI bleed 05/15/2015   s/p Sigmoid Polypectomy 05/04/2015 - prior to STEMI on May 11, 2015; Had GI Bleed following TPA for CVA, while on ASA & Brilinta; Flex Sig - 1. Internal Hemorrhoids, 2. Distal sigmoid polypectomy site with flat Whitebase status post biopsy and tattoo with 2mL spot over 3 injections 3. Proximal sigmoid polypectomy site seen as well, 4) otherwise normal with no evidence of bleed   Hyperlipidemia with target LDL less than 70    Ischemic cardiomyopathy 05/11/2015   EF 40% on cath 05/11/2015 after anterior STEMI, EF 20-25% on echo  05/13/2015; 12/2015 =  EF ~45%.   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   Prediabetes Oct 2016   A1C 6.0 in Oct 2016   ST elevation myocardial infarction (STEMI) involving left anterior descending (LAD) coronary artery with complication (HCC) 05/11/2015   100% Prox LAD - PCI with overlapping DES x 2   Tobacco abuse    a. 30 yrs - 1.5 ppd. - Quit 05/11/2015   Type 2 diabetes mellitus with other specified complication (HCC) 10/23/2021   Vision abnormalities    Right hemianopsia    PAST SURGICAL HISTORY: Past Surgical History:  Procedure Laterality Date   CARDIAC CATHETERIZATION N/A 05/11/2015   Procedure: Left Heart Cath and Coronary Angiography;  Surgeon: Peter M Swaziland, MD;  Location: [facility] INVASIVE CV LAB;  Service: Cardiovascular;  100% pLAD (long lesion). RCA - prox 90%, mid 80%. OM2 & OM3 80% (OM2 & 3 not necessarily PCI targets); EF 35-45% with Anterio HK   CARDIAC CATHETERIZATION N/A 05/11/2015   Procedure: Coronary Stent Intervention;  Surgeon: Peter M Swaziland, MD;  Location: [facility] INVASIVE CV LAB;  Service: Cardiovascular; PCI to Prox-Mid LAD with Overlapping  Promus Premier DES 3.0 x 38 & 3.0 x 16 (post-dilated to 3.5 mm)     FLEXIBLE SIGMOIDOSCOPY N/A 05/18/2015   Procedure: FLEXIBLE SIGMOIDOSCOPY;  Surgeon: Vida Rigger, MD;  Location: [facility] ENDOSCOPY;  Service: Endoscopy;  Laterality: N/A;   NM MYOVIEW LTD  08/2016   EF 30% with large anterior-anteroseptal and apical infarct consistent with LAD infarct. His HIGH RISK because of large infarct. No ischemia.   S/P Appendectomy     Age 89   S/P Inguinal Hernia Repair     In his 20's.   TRANSCAROTID ARTERY REVASCULARIZATION  Left 04/30/2022   Procedure: Transcarotid Artery Revascularization;  Surgeon: Chuck Hint, MD;  Location: [facility] OR;  Service: Vascular;  Laterality: Left;   TRANSTHORACIC ECHOCARDIOGRAM  05/15/2015   Mild LVH, EF 35-40% - Mod HK of mid-apical anteroseptal, anterior & apical walls.  Gr 1 DD. Mild bilateral Atrial Enlargement.   TRANSTHORACIC ECHOCARDIOGRAM  12/2015   EF up to ~40-45% wiht Anterior-Anteroapical HK.  Mod LV dilation with increased LVEDP.   TRANSTHORACIC ECHOCARDIOGRAM  12/'17; 4/'18   a) EF ~30-35% (in setting of HTN Urgency). - Gr 2 DD;; b) EF 30-35% (per Dr. Royann Shivers - 35-40%). Anterior-anteroapical Akinesis. Gr2 DD w/ high filling pressures. Mild LA dilation. Mild RV dilation.   ULTRASOUND GUIDANCE FOR VASCULAR ACCESS Right 04/30/2022   Procedure: ULTRASOUND GUIDANCE FOR VASCULAR ACCESS;  Surgeon: Chuck Hint, MD;  Location: [facility] OR;  Service: Vascular;  Laterality: Right;    FAMILY HISTORY: Family History  Adopted: Yes  Problem Relation Age of Onset   Other Mother        eczema    Cancer Mother    Other Other        Adopted - unaware of biological parent's histories.   Stroke Neg Hx     SOCIAL HISTORY: Social History   Socioeconomic History   Marital status: Married    Spouse name: Not on file   Number of children: 3   Years of education: Not on file   Highest education level: Not on file  Occupational History   Occupation: retired  from catering business  Tobacco Use   Smoking status: Former    Packs/day: 1.00    Years: 30.00    Additional  pack years: 0.00    Total pack years: 30.00    Types: Cigarettes    Quit date: 04/06/2015    Years since quitting: 7.6    Passive exposure: Current (Wife is a smoker)   Smokeless tobacco: Never   Tobacco comments:    Quit the day of his STEMI  Substance and Sexual Activity   Alcohol use: No    Alcohol/week: 0.0 standard drinks of alcohol   Drug use: No   Sexual activity: Not on file  Other Topics Concern   Not on file  Social History Narrative   Lives in [place] with wife. Originally from [place].   Social Determinants of Health   Financial Resource Strain: Not on file  Food Insecurity: Not on file  Transportation Needs: Not on file  Physical Activity: Not on file  Stress: Not on file  Social Connections: Not on file  Intimate Partner Violence: Not on file      PHYSICAL EXAM  Vitals:   12/11/22 1008  BP: (!) 108/56  Pulse: 63  Weight: 188 lb (85.3 kg)  Height: 5\' 7"  (1.702 m)   Body mass index is 29.44 kg/m.  Generalized: Well developed, in no acute distress   Neurological examination  Mentation: Alert oriented to time, place, history taking. Follows all commands.  Some trouble with processing information, mild expressive aphasia Cranial nerve II-XII: Pupils were equal round reactive to light. Extraocular movements were full, visual field were full on confrontational test. Facial sensation and strength were normal. Uvula tongue midline. Head turning and shoulder shrug  were normal and symmetric. Motor: The motor testing reveals 5 over 5 strength of all 4 extremities. Good symmetric motor tone is noted throughout.  Sensory: Sensory testing is intact to soft touch on all 4 extremities. No evidence of extinction is noted.  Coordination: Cerebellar testing reveals good finger-nose-finger and heel-to-shin bilaterally.  Gait and station: Gait is normal.   Reflexes: Deep tendon reflexes are symmetric and normal bilaterally.   DIAGNOSTIC DATA (LABS, IMAGING, TESTING) - I reviewed patient records, labs, notes, testing and imaging myself where available.  Lab Results  Component Value Date   WBC 8.6 05/06/2022   HGB 12.4 (L) 05/06/2022   HCT 37.0 (L) 05/06/2022   MCV 91.6 05/06/2022   PLT 272 05/06/2022      Component Value Date/Time   NA 140 05/06/2022 0659   NA 146 (H) 11/24/2019 1830   K 4.0 05/06/2022 0659   CL 109 05/06/2022 0659   CO2 25 05/06/2022 0659   GLUCOSE 106 (H) 05/06/2022 0659   BUN 15 05/06/2022 0659   BUN 24 11/24/2019 1830   CREATININE 1.21 05/06/2022 0659   CREATININE 1.51 (H) 09/24/2016 1554   CALCIUM 8.7 (L) 05/06/2022 0659   PROT 5.9 (L) 05/02/2022 0649   ALBUMIN 2.6 (L) 05/02/2022 0649   AST 23 05/02/2022 0649   ALT 17 05/02/2022 0649   ALKPHOS 34 (L) 05/02/2022 0649   BILITOT 0.6 05/02/2022 0649   GFRNONAA >60 05/06/2022 0659   GFRAA 69 11/24/2019 1830   Lab Results  Component Value Date   CHOL 86 05/01/2022   HDL 22 (L) 05/01/2022   LDLCALC 43 05/01/2022   TRIG 104 05/01/2022   CHOLHDL 3.9 05/01/2022   Lab Results  Component Value Date   HGBA1C 5.8 (H) 04/22/2022   No results found for: "VITAMINB12" Lab Results  Component Value Date   TSH 1.006 05/11/2015      ASSESSMENT AND PLAN 77 y.o. 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 (HCC) (05/14/2015), Cholelithiasis with obstruction (11/2019), Chronic combined systolic and diastolic congestive heart failure, NYHA class 2 (HCC) (05/11/2015 - 05/15/15), Coronary artery disease involving native heart (05/11/2015), Essential hypertension, H/O: GI bleed (05/15/2015), Hyperlipidemia with target LDL less than 70, Ischemic cardiomyopathy (05/11/2015), Liver disease, chronic, with cirrhosis (HCC) (11/2019), Prediabetes (Oct 2016), ST  elevation myocardial infarction (STEMI) involving left anterior descending (LAD) coronary artery with complication (HCC) (05/11/2015), Tobacco abuse, Type 2 diabetes mellitus with other specified complication (HCC) (10/23/2021), and Vision abnormalities. here with:  Stroke:  Acute ischemic L MCA infarct, etiology likely from left ICA stenosis/plaque  Hyperlipidemia Hypertension  Continue aspirin 81 mg daily  and Plavix 75 mg daily for secondary stroke prevention.  Discussed secondary stroke prevention measures and importance of close PCP follow up for aggressive stroke risk factor management. I have gone over the pathophysiology of stroke, warning signs and symptoms, risk factors and their management in some detail with instructions to go to the closest emergency room for symptoms of concern. HTN: BP goal <130/90.   HLD: LDL goal <70.  DMII: A1c goal<7.0.  Encouraged patient to monitor diet and encouraged exercise FU with our office PRN     Butch Penny, MSN, NP-C 12/11/2022, 10:21 AM [facility] [address] [place], [place] [address] [phone]

## 2022-12-11 NOTE — Patient Instructions (Signed)
Your Plan:  Continue ASA  and Plavix  Blood pressure goal <130/90 Cholesterol LDL goal <70 Diabetes goal A1c <7 Monitor diet and try to exercise   Thank you for coming to see [place] at [facility]. I hope we have been able to provide you high quality care today.  You may receive a patient satisfaction survey over the next few weeks. We would appreciate your feedback and comments so that we may continue to improve ourselves and the health of our patients.

## 2023-02-20 DIAGNOSIS — Z0000 Encounter for general adult medical examination without abnormal findings: Secondary | ICD-10-CM | POA: Diagnosis not present

## 2023-02-20 DIAGNOSIS — E1169 Type 2 diabetes mellitus with other specified complication: Secondary | ICD-10-CM | POA: Diagnosis not present

## 2023-02-20 DIAGNOSIS — I779 Disorder of arteries and arterioles, unspecified: Secondary | ICD-10-CM | POA: Diagnosis not present

## 2023-02-20 DIAGNOSIS — E119 Type 2 diabetes mellitus without complications: Secondary | ICD-10-CM | POA: Diagnosis not present

## 2023-02-20 DIAGNOSIS — R32 Unspecified urinary incontinence: Secondary | ICD-10-CM | POA: Diagnosis not present

## 2023-02-20 DIAGNOSIS — I5022 Chronic systolic (congestive) heart failure: Secondary | ICD-10-CM | POA: Diagnosis not present

## 2023-02-20 DIAGNOSIS — I272 Pulmonary hypertension, unspecified: Secondary | ICD-10-CM | POA: Diagnosis not present

## 2023-02-20 DIAGNOSIS — E785 Hyperlipidemia, unspecified: Secondary | ICD-10-CM | POA: Diagnosis not present

## 2023-02-20 DIAGNOSIS — I130 Hypertensive heart and chronic kidney disease with heart failure and stage 1 through stage 4 chronic kidney disease, or unspecified chronic kidney disease: Secondary | ICD-10-CM | POA: Diagnosis not present

## 2023-02-25 ENCOUNTER — Other Ambulatory Visit: Payer: Self-pay | Admitting: Cardiology

## 2023-03-03 NOTE — Progress Notes (Unsigned)
  Cardiology Office Note   Date:  03/05/2023  ID:  Damon Walker, DOB 1945-10-05, MRN [id number] PCP:  [name], MD [facility] Cardiologist: Bryan Lemma, MD  Reason for visit: 19-month follow-up  History of Present Illness    Damon Walker is a 77 y.o. male with a hx of CAD status post STEMI with PCI to LAD in 2016 followed by CVA with right eye vision loss & PE, ischemic cardiomyopathy, chronic systolic and diastolic heart failure, L-Sided CVA September 2023- s/p L TCAR (04/30/22) with residual expressive aphasia, hypertension, hyperlipidemia, CKD, known right vertebral artery occlusion, history of tobacco use quit in 2016.   He last saw Dr. Herbie Baltimore in January 2024.  Noted that hip and back pain limit his activity.  With the bradycardia, his carvedilol was reduced to 3.125 mg twice daily.  Losartan increased to 100 mg daily.  Blood pressure did not tolerate Entresto in the past.  Continued on aspirin and Plavix for history of CVA.  Today, he comes in with his wife.  They have no concerns.  From a heart failure standpoint, he denies weight gain, lower extremity edema, shortness of breath, orthopnea and PND.  They state rare use of Lasix 20 mg if patient gains 3 pounds in 1 day.  Patient also denies chest pain and palpitations.  He has a history of prediabetes with a last hemoglobin A1c of 6%.  They state patient is careful about his diet.     Objective / Physical Exam   Vital signs:  BP 112/60 (BP Location: Left Arm, Patient Position: Sitting, Cuff Size: Normal)   Pulse 66   Ht 5\' 7"  (1.702 m)   Wt 183 lb 9.6 oz (83.3 kg)   SpO2 98%   BMI 28.76 kg/m     GEN: No acute distress CARDIAC: RRR, no murmurs RESPIRATORY:  Clear to auscultation without rales, wheezing or rhonchi  EXTREMITIES: No edema  Assessment and Plan   Coronary artery disease, no angina -Anterior STEMI in 2016 status post PCI to the LAD, large infarct with delayed presentation -Despite significant  RCA disease, Myoview did not show any evidence of ischemia. -Continue statin and antiplatelet therapy. -Patient requested sublingual nitroglycerin refill -refilled today.  Chronic systolic and diastolic heart failure, euvolemic Ischemic cardiomyopathy -Related to large anterior infarct, EF 35% -Did not tolerate Entresto in the past due to soft BP -Declined ICD again today -Defer on SGLT2-I given borderline blood pressure/age  -Continue carvedilol 3.125 mg twice daily (heart rate 66 today).  Note, patient was previously on Coreg 6.25 mg twice daily but with heart rate 46 in January 2024, dose was decreased. -Continue losartan 100 mg daily. -Continue Lasix 20 mg as needed for weight gain, shortness of breath or leg swelling.  History of CVA x 2 -CVA in 2016 following PCI with right eye vision loss (minimal residual) -L-Sided CVA September 2023- s/p L TCAR (04/30/22- residual expressive aphasia  -Carotid disease followed by vascular -Continue DAPT likely long-term  Hypertension, well-controlled -Reviewed home BP log with systolics mostly 100s to 120s over 50s to 70s.  Continue current medications  Hyperlipidemia with goal LDL less than 70 -LDL 67 in July 2024.  Continue Crestor 40 mg daily.  Disposition - Follow-up in 6 months.   Signed, Cannon Kettle, PA-C  03/05/2023 [facility]

## 2023-03-05 ENCOUNTER — Encounter: Payer: Self-pay | Admitting: Physician Assistant

## 2023-03-05 ENCOUNTER — Ambulatory Visit: Payer: Medicare Other | Attending: Physician Assistant | Admitting: Physician Assistant

## 2023-03-05 VITALS — BP 112/60 | HR 66 | Ht 67.0 in | Wt 183.6 lb

## 2023-03-05 DIAGNOSIS — I2510 Atherosclerotic heart disease of native coronary artery without angina pectoris: Secondary | ICD-10-CM | POA: Diagnosis not present

## 2023-03-05 DIAGNOSIS — I10 Essential (primary) hypertension: Secondary | ICD-10-CM

## 2023-03-05 DIAGNOSIS — E785 Hyperlipidemia, unspecified: Secondary | ICD-10-CM | POA: Diagnosis not present

## 2023-03-05 DIAGNOSIS — I5042 Chronic combined systolic (congestive) and diastolic (congestive) heart failure: Secondary | ICD-10-CM

## 2023-03-05 MED ORDER — NITROGLYCERIN 0.4 MG SL SUBL: 0.4000 mg | SUBLINGUAL_TABLET | SUBLINGUAL | 3 refills | Status: AC | PRN

## 2023-03-05 NOTE — Patient Instructions (Signed)
Medication Instructions:  No Changes *If you need a refill on your cardiac medications before your next appointment, please call your pharmacy*   Lab Work: No Labs If you have labs (blood work) drawn today and your tests are completely normal, you will receive your results only by: MyChart Message (if you have MyChart) OR A paper copy in the mail If you have any lab test that is abnormal or we need to change your treatment, we will call you to review the results.   Testing/Procedures: No Testing   Follow-Up: At [facility], you and your health needs are our priority.  As part of our continuing mission to provide you with exceptional heart care, we have created designated Provider Care Teams.  These Care Teams include your primary Cardiologist (physician) and Advanced Practice Providers (APPs -  Physician Assistants and Nurse Practitioners) who all work together to provide you with the care you need, when you need it.  We recommend signing up for the patient portal called "MyChart".  Sign up information is provided on this After Visit Summary.  MyChart is used to connect with patients for Virtual Visits (Telemedicine).  Patients are able to view lab/test results, encounter notes, upcoming appointments, etc.  Non-urgent messages can be sent to your provider as well.   To learn more about what you can do with MyChart, go to [email or web address].    Your next appointment:   6 month(s)  Provider:   David [name], MD   

## 2023-03-06 DIAGNOSIS — R300 Dysuria: Secondary | ICD-10-CM | POA: Diagnosis not present

## 2023-03-18 ENCOUNTER — Emergency Department (HOSPITAL_COMMUNITY)
Admission: EM | Admit: 2023-03-18 | Discharge: 2023-03-19 | Disposition: A | Discharge status: Home / Self Care | Payer: Medicare Other | Source: Home / Self Care | Attending: Emergency Medicine | Admitting: Emergency Medicine

## 2023-03-18 ENCOUNTER — Emergency Department (HOSPITAL_COMMUNITY)
Admission: EM | Admit: 2023-03-18 | Discharge: 2023-03-18 | Disposition: A | Discharge status: Home / Self Care | Payer: Medicare Other | Attending: Emergency Medicine | Admitting: Emergency Medicine

## 2023-03-18 ENCOUNTER — Encounter (HOSPITAL_COMMUNITY): Payer: Self-pay

## 2023-03-18 ENCOUNTER — Encounter (HOSPITAL_COMMUNITY): Payer: Self-pay | Admitting: Emergency Medicine

## 2023-03-18 ENCOUNTER — Other Ambulatory Visit: Payer: Self-pay

## 2023-03-18 DIAGNOSIS — R310 Gross hematuria: Secondary | ICD-10-CM | POA: Diagnosis not present

## 2023-03-18 DIAGNOSIS — Z7902 Long term (current) use of antithrombotics/antiplatelets: Secondary | ICD-10-CM | POA: Insufficient documentation

## 2023-03-18 DIAGNOSIS — R339 Retention of urine, unspecified: Secondary | ICD-10-CM | POA: Insufficient documentation

## 2023-03-18 DIAGNOSIS — Z7982 Long term (current) use of aspirin: Secondary | ICD-10-CM | POA: Insufficient documentation

## 2023-03-18 DIAGNOSIS — I10 Essential (primary) hypertension: Secondary | ICD-10-CM | POA: Diagnosis not present

## 2023-03-18 DIAGNOSIS — Z91040 Latex allergy status: Secondary | ICD-10-CM | POA: Insufficient documentation

## 2023-03-18 DIAGNOSIS — Z978 Presence of other specified devices: Secondary | ICD-10-CM

## 2023-03-18 DIAGNOSIS — R338 Other retention of urine: Secondary | ICD-10-CM

## 2023-03-18 DIAGNOSIS — R030 Elevated blood-pressure reading, without diagnosis of hypertension: Secondary | ICD-10-CM | POA: Diagnosis not present

## 2023-03-18 LAB — BASIC METABOLIC PANEL
Anion gap: 12 (ref 5–15)
BUN: 22 mg/dL (ref 8–23)
CO2: 22 mmol/L (ref 22–32)
Calcium: 9.6 mg/dL (ref 8.9–10.3)
Chloride: 103 mmol/L (ref 98–111)
Creatinine, Ser: 1.4 mg/dL — ABNORMAL HIGH (ref 0.61–1.24)
GFR, Estimated: 51 mL/min — ABNORMAL LOW (ref >60)
Glucose, Bld: 114 mg/dL — ABNORMAL HIGH (ref 70–99)
Potassium: 4.4 mmol/L (ref 3.5–5.1)
Sodium: 137 mmol/L (ref 135–145)

## 2023-03-18 LAB — URINALYSIS, ROUTINE W REFLEX MICROSCOPIC
Bilirubin Urine: NEGATIVE
Glucose, UA: NEGATIVE mg/dL
Hgb urine dipstick: NEGATIVE
Ketones, ur: NEGATIVE mg/dL
Leukocytes,Ua: NEGATIVE
Nitrite: NEGATIVE
Protein, ur: NEGATIVE mg/dL
Specific Gravity, Urine: 1.018 (ref 1.005–1.030)
pH: 5 (ref 5.0–8.0)

## 2023-03-18 LAB — CBC
HCT: 44.1 % (ref 39.0–52.0)
Hemoglobin: 14.7 g/dL (ref 13.0–17.0)
MCH: 30.9 pg (ref 26.0–34.0)
MCHC: 33.3 g/dL (ref 30.0–36.0)
MCV: 92.6 fL (ref 80.0–100.0)
Platelets: 240 10*3/uL (ref 150–400)
RBC: 4.76 MIL/uL (ref 4.22–5.81)
RDW: 12.5 % (ref 11.5–15.5)
WBC: 11.4 10*3/uL — ABNORMAL HIGH (ref 4.0–10.5)
nRBC: 0 % (ref 0.0–0.2)

## 2023-03-18 NOTE — ED Provider Notes (Signed)
[facility] EMERGENCY DEPARTMENT AT [facility] Provider Note   CSN: [id number] Arrival date & time: 03/18/23  1711     History  Chief Complaint  Patient presents with   Urinary Retention    Damon Walker is a 77 y.o. male.  Pt c/o trouble urinating today. States feels has if has to urinate, suprapubic fullness/discomfort, but cannot urinate. No recent foley cath or gu instrumentation. Denies dysuria or hematuria. Denies hx enlarged prostate. No fever or chills. No flank pain. Is eating/drinking. No wt change.   The history is provided by the patient, medical records and a significant other.       Home Medications Prior to Admission medications   Medication Sig Start Date End Date Taking? Authorizing Provider  acetaminophen (TYLENOL) 325 MG tablet Take 1-2 tablets (325-650 mg total) by mouth every 4 (four) hours as needed for mild pain. 05/07/22   Love, Evlyn Kanner, PA-C  aspirin EC 81 MG tablet Take 1 tablet (81 mg total) by mouth daily. 05/07/22 06/01/23  Love, Evlyn Kanner, PA-C  carvedilol (COREG) 3.125 MG tablet Take 1 tablet (3.125 mg total) by mouth 2 (two) times daily. 09/02/22   Marykay Lex, MD  clopidogrel (PLAVIX) 75 MG tablet Take 1 tablet (75 mg total) by mouth daily. 05/07/22   Love, Evlyn Kanner, PA-C  famotidine (PEPCID) 40 MG tablet Take 1 tablet (40 mg total) by mouth every other day. 05/07/22   Love, Evlyn Kanner, PA-C  ferrous sulfate 325 (65 FE) MG tablet Take 1 tablet (325 mg total) by mouth daily with breakfast. 05/07/22   Love, Evlyn Kanner, PA-C  furosemide (LASIX) 20 MG tablet TAKE 1 TABLET BY MOUTH ONCE DAILY AS NEEDED 02/25/23   Marykay Lex, MD  losartan (COZAAR) 100 MG tablet Take 1 tablet (100 mg total) by mouth daily. 09/02/22   Marykay Lex, MD  nitroGLYCERIN (NITROSTAT) 0.4 MG SL tablet Place 1 tablet (0.4 mg total) under the tongue every 5 (five) minutes x 3 doses as needed for chest pain. 03/05/23   Cannon Kettle, PA-C  pantoprazole  (PROTONIX) 20 MG tablet Take 1 tablet (20 mg total) by mouth every other day. 05/07/22   Love, Evlyn Kanner, PA-C  rosuvastatin (CRESTOR) 40 MG tablet Take 1 tablet (40 mg total) by mouth daily at 6 PM. 05/07/22   Love, Evlyn Kanner, PA-C  sertraline (ZOLOFT) 25 MG tablet Take 0.5 tablets (12.5 mg total) by mouth at bedtime. 05/07/22   Love, Evlyn Kanner, PA-C  traZODone (DESYREL) 50 MG tablet Take 1 tablet (50 mg total) by mouth at bedtime. 05/07/22   Love, Evlyn Kanner, PA-C      Allergies    Latex    Review of Systems   Review of Systems  Constitutional:  Negative for chills and fever.  Respiratory:  Negative for shortness of breath.   Cardiovascular:  Negative for chest pain.  Gastrointestinal:  Negative for abdominal pain, diarrhea and vomiting.  Genitourinary:  Negative for dysuria, flank pain, scrotal swelling and testicular pain.  Musculoskeletal:  Negative for back pain.  Neurological:  Negative for headaches.    Physical Exam Updated Vital Signs BP (!) 106/53   Pulse 65   Temp 97.7 F (36.5 C) (Oral)   Resp 18   SpO2 96%  Physical Exam Vitals and nursing note reviewed.  Constitutional:      Appearance: Normal appearance. He is well-developed.  HENT:     Head: Atraumatic.     Nose:  Nose normal.     Mouth/Throat:     Mouth: Mucous membranes are moist.  Eyes:     General: No scleral icterus.    Conjunctiva/sclera: Conjunctivae normal.  Neck:     Trachea: No tracheal deviation.  Cardiovascular:     Rate and Rhythm: Normal rate.     Pulses: Normal pulses.  Pulmonary:     Effort: Pulmonary effort is normal. No accessory muscle usage or respiratory distress.  Abdominal:     General: Bowel sounds are normal. There is no distension.     Palpations: Abdomen is soft. There is no mass.     Tenderness: There is no abdominal tenderness. There is no guarding.  Genitourinary:    Comments: No cva tenderness. Normal external gu exam. No scrotal or testicular pain, swelling, or tenderness.   Musculoskeletal:        General: No swelling.     Cervical back: Neck supple.     Right lower leg: No edema.     Left lower leg: No edema.  Skin:    General: Skin is warm and dry.     Findings: No rash.  Neurological:     Mental Status: He is alert.     Comments: Alert, speech clear. Motor/sens grossly intact bil. Steady gait.   Psychiatric:        Mood and Affect: Mood normal.     ED Results / Procedures / Treatments   Labs (all labs ordered are listed, but only abnormal results are displayed) Results for orders placed or performed during the hospital encounter of 03/18/23  CBC  Result Value Ref Range   WBC 11.4 (H) 4.0 - 10.5 K/uL   RBC 4.76 4.22 - 5.81 MIL/uL   Hemoglobin 14.7 13.0 - 17.0 g/dL   HCT 57.8 46.9 - 62.9 %   MCV 92.6 80.0 - 100.0 fL   MCH 30.9 26.0 - 34.0 pg   MCHC 33.3 30.0 - 36.0 g/dL   RDW 52.8 41.3 - 24.4 %   Platelets 240 150 - 400 K/uL   nRBC 0.0 0.0 - 0.2 %  Basic metabolic panel  Result Value Ref Range   Sodium 137 135 - 145 mmol/L   Potassium 4.4 3.5 - 5.1 mmol/L   Chloride 103 98 - 111 mmol/L   CO2 22 22 - 32 mmol/L   Glucose, Bld 114 (H) 70 - 99 mg/dL   BUN 22 8 - 23 mg/dL   Creatinine, Ser 0.10 (H) 0.61 - 1.24 mg/dL   Calcium 9.6 8.9 - 27.2 mg/dL   GFR, Estimated 51 (L) >60 mL/min   Anion gap 12 5 - 15  Urinalysis, Routine w reflex microscopic -Urine, Catheterized  Result Value Ref Range   Color, Urine YELLOW YELLOW   APPearance CLEAR CLEAR   Specific Gravity, Urine 1.018 1.005 - 1.030   pH 5.0 5.0 - 8.0   Glucose, UA NEGATIVE NEGATIVE mg/dL   Hgb urine dipstick NEGATIVE NEGATIVE   Bilirubin Urine NEGATIVE NEGATIVE   Ketones, ur NEGATIVE NEGATIVE mg/dL   Protein, ur NEGATIVE NEGATIVE mg/dL   Nitrite NEGATIVE NEGATIVE   Leukocytes,Ua NEGATIVE NEGATIVE     EKG None  Radiology No results found.  Procedures Procedures    Medications Ordered in ED Medications - No data to display  ED Course/ Medical Decision Making/  A&P  Medical Decision Making Problems Addressed: Elevated blood pressure reading: acute illness or injury Urinary retention: acute illness or injury with systemic symptoms that poses a threat to life or bodily functions  Amount and/or Complexity of Data Reviewed Independent Historian: spouse    Details: hx External Data Reviewed: notes. Labs: ordered. Decision-making details documented in ED Course.  Risk Decision regarding hospitalization.   Labs ordered/sent.   Differential diagnosis includes enlarged prostate, uti, dehydration/aki, bladder outlet obstruction, etc. Dispo decision including potential need for admission considered - will get labs and reassess.   Reviewed nursing notes and prior charts for additional history. External reports reviewed. Additional history from: family.   Foley.   Labs reviewed/interpreted by me - chem normal, hgb normal. Ua neg for uti.   W placement foley, pvr ~ 750 cc - will leave foley, leg bag. Urology f/u.   Bp improved. Abd soft nt. Pt currently appears stable for d/c.   Rec close urology f/u.  Return precautions provided.          Final Clinical Impression(s) / ED Diagnoses Final diagnoses:  Urinary retention  Elevated blood pressure reading    Rx / DC Orders ED Discharge Orders     None         Cathren Laine, MD 03/18/23 1920

## 2023-03-18 NOTE — ED Triage Notes (Addendum)
Patient just had catheter placed here for urinary retention.  Patient's wife reports that when they got home, they noticed blood in urine so they came back. Patient's bag noted with bright red blood. Patient takes plavix.

## 2023-03-18 NOTE — ED Triage Notes (Signed)
[facility] reports pt coming from home c/o urinary retention. Pt has not urinated since yesterday. Having lower abdominal and scrotum pain.

## 2023-03-18 NOTE — Discharge Instructions (Addendum)
It was our pleasure to provide your ER care today - we hope that you feel better.  Drink plenty of fluids/stay well hydrated.   Empty urine from leg beg as need.   Note that there is a balloon in your bladder - do NOT attempt to remove urine catheter without balloon first being deflated or injury to bladder and urethra can occur.   Follow up closely with urologist in the next 1-2 weeks - call office tomorrow AM to arrange appointment.   Return to ER if worse, new symptoms, fevers, catheter not draining, severe abdominal pain, persistent vomiting, or other concern.

## 2023-03-18 NOTE — ED Notes (Signed)
Leg bag applied and instruction given to wife and pt on usage and drainage.

## 2023-03-19 MED ORDER — SODIUM CHLORIDE 0.9 % IR SOLN
3000.0000 mL | Status: DC
  Administered 2023-03-19: 3000 mL

## 2023-03-19 NOTE — ED Provider Notes (Signed)
[place] EMERGENCY DEPARTMENT AT [facility] Provider Note   CSN: [id number] Arrival date & time: 03/18/23  2149     History  Chief Complaint  Patient presents with   Hematuria    Damon Walker is a 77 y.o. male.   Hematuria     77 year old male presenting to the emergency department with a chief complaint of hematuria in his Foley catheter.  The patient was seen in the emergency department earlier today and had a Foley catheter placed for acute urinary retention.  He states that he is on Plavix.  He has had a significant amount of passage of bright red blood through his Foley catheter.  He denies any abdominal pain.  Home Medications Prior to Admission medications   Medication Sig Start Date End Date Taking? Authorizing Provider  acetaminophen (TYLENOL) 325 MG tablet Take 1-2 tablets (325-650 mg total) by mouth every 4 (four) hours as needed for mild pain. 05/07/22   Love, Evlyn Kanner, PA-C  aspirin EC 81 MG tablet Take 1 tablet (81 mg total) by mouth daily. 05/07/22 06/01/23  Love, Evlyn Kanner, PA-C  carvedilol (COREG) 3.125 MG tablet Take 1 tablet (3.125 mg total) by mouth 2 (two) times daily. 09/02/22   Marykay Lex, MD  clopidogrel (PLAVIX) 75 MG tablet Take 1 tablet (75 mg total) by mouth daily. 05/07/22   Love, Evlyn Kanner, PA-C  famotidine (PEPCID) 40 MG tablet Take 1 tablet (40 mg total) by mouth every other day. 05/07/22   Love, Evlyn Kanner, PA-C  ferrous sulfate 325 (65 FE) MG tablet Take 1 tablet (325 mg total) by mouth daily with breakfast. 05/07/22   Love, Evlyn Kanner, PA-C  furosemide (LASIX) 20 MG tablet TAKE 1 TABLET BY MOUTH ONCE DAILY AS NEEDED 02/25/23   Marykay Lex, MD  losartan (COZAAR) 100 MG tablet Take 1 tablet (100 mg total) by mouth daily. 09/02/22   Marykay Lex, MD  nitroGLYCERIN (NITROSTAT) 0.4 MG SL tablet Place 1 tablet (0.4 mg total) under the tongue every 5 (five) minutes x 3 doses as needed for chest pain. 03/05/23   Cannon Kettle, PA-C   pantoprazole (PROTONIX) 20 MG tablet Take 1 tablet (20 mg total) by mouth every other day. 05/07/22   Love, Evlyn Kanner, PA-C  rosuvastatin (CRESTOR) 40 MG tablet Take 1 tablet (40 mg total) by mouth daily at 6 PM. 05/07/22   Love, Evlyn Kanner, PA-C  sertraline (ZOLOFT) 25 MG tablet Take 0.5 tablets (12.5 mg total) by mouth at bedtime. 05/07/22   Love, Evlyn Kanner, PA-C  traZODone (DESYREL) 50 MG tablet Take 1 tablet (50 mg total) by mouth at bedtime. 05/07/22   Jacquelynn Cree, PA-C      Allergies    Latex    Review of Systems   Review of Systems  Genitourinary:  Positive for hematuria.  All other systems reviewed and are negative.   Physical Exam Updated Vital Signs BP 110/85   Pulse (!) 58   Temp 97.7 F (36.5 C) (Oral)   Resp 16   Wt 84 kg   SpO2 96%   BMI 29.00 kg/m  Physical Exam Vitals and nursing note reviewed.  Constitutional:      General: He is not in acute distress. HENT:     Head: Normocephalic and atraumatic.  Eyes:     Conjunctiva/sclera: Conjunctivae normal.     Pupils: Pupils are equal, round, and reactive to light.  Cardiovascular:     Rate and  Rhythm: Normal rate and regular rhythm.  Pulmonary:     Effort: Pulmonary effort is normal. No respiratory distress.  Abdominal:     General: There is no distension.     Tenderness: There is no abdominal tenderness. There is no guarding.  Genitourinary:    Comments: Foley catheter in place, draining bright red blood Musculoskeletal:        General: No deformity or signs of injury.     Cervical back: Neck supple.  Skin:    Findings: No lesion or rash.  Neurological:     General: No focal deficit present.     Mental Status: He is alert. Mental status is at baseline.     ED Results / Procedures / Treatments   Labs (all labs ordered are listed, but only abnormal results are displayed) Labs Reviewed - No data to display  EKG None  Radiology No results found.  Procedures BLADDER CATHETERIZATION  Date/Time:  03/19/2023 4:23 AM  Performed by: Mathis Fare, RN Authorized by: Ernie Avena, MD   Consent:    Consent obtained:  Verbal   Consent given by:  Patient Universal protocol:    Patient identity confirmed:  Verbally with patient Pre-procedure details:    Procedure purpose:  Therapeutic   Preparation: Patient was prepped and draped in usual sterile fashion   Procedure details:    Catheter insertion:  Indwelling   Catheter type:  Triple lumen   Catheter size:  16 Fr   Bladder irrigation: yes     Number of attempts:  1   Urine characteristics:  Bloody Post-procedure details:    Procedure completion:  Tolerated     Medications Ordered in ED Medications  sodium chloride irrigation 0.9 % 3,000 mL (3,000 mLs Irrigation New Bag/Given 03/19/23 0513)    ED Course/ Medical Decision Making/ A&P                                 Medical Decision Making Risk Prescription drug management.    77 year old male presenting to the emergency department with a chief complaint of hematuria in his Foley catheter.  The patient was seen in the emergency department earlier today and had a Foley catheter placed for acute urinary retention.  He states that he is on Plavix.  He has had a significant amount of passage of bright red blood through his Foley catheter.  He denies any abdominal pain.  Arrival, the patient was vitally stable.  His Foley catheter was successfully irrigated with 70 cc of normal saline.  He continued to have drainage of bright red blood.  His Foley catheters were accept was exchanged with a three-way hematuria catheter 16 [place] and continuous bladder irrigation was initiated.  The patient was reassessed, he is in no distress, his Foley catheter drainage is now light red.  Discussed with him continued bladder irrigation plans in the emergency department until improvement to clear pink.  Ultimate disposition pending results of reassessment and clearance of the patient's gross  hematuria.  If improved, plan for outpatient urology follow-up for urinary retention as had been the previous plan. Signout given to Dr. Jeraldine Loots at 0700.   Final Clinical Impression(s) / ED Diagnoses Final diagnoses:  Acute urinary retention  Gross hematuria  Foley catheter in place    Rx / DC Orders ED Discharge Orders     None         Ernie Avena, MD 03/19/23  0624  

## 2023-03-19 NOTE — ED Notes (Signed)
Foley irrigated with 70ml Saline.

## 2023-03-19 NOTE — Discharge Instructions (Signed)
Your bladder was continuously irrigated and your hematuria improved.  Please follow-up outpatient with urology for Foley catheter removal and repeat assessment

## 2023-03-19 NOTE — ED Provider Notes (Signed)
Care of the patient assumed at signout.  Patient was undergoing continuous bladder irrigation, and now fluid is essentially clear with pink tinge.  No complications, no hemodynamic changes, patient discharged to follow-up with urology.   Gerhard Munch, MD 03/19/23 1444

## 2023-03-19 NOTE — ED Notes (Signed)
Patient send on with foley catheter and leg bag. Patient and family given instructions on foley care, both understood.

## 2023-03-20 DIAGNOSIS — R338 Other retention of urine: Secondary | ICD-10-CM | POA: Diagnosis not present

## 2023-03-20 DIAGNOSIS — N401 Enlarged prostate with lower urinary tract symptoms: Secondary | ICD-10-CM | POA: Diagnosis not present

## 2023-03-20 DIAGNOSIS — R310 Gross hematuria: Secondary | ICD-10-CM | POA: Diagnosis not present

## 2023-03-20 DIAGNOSIS — N403 Nodular prostate with lower urinary tract symptoms: Secondary | ICD-10-CM | POA: Diagnosis not present

## 2023-03-24 ENCOUNTER — Emergency Department (HOSPITAL_COMMUNITY): Payer: Medicare Other

## 2023-03-24 ENCOUNTER — Other Ambulatory Visit: Payer: Self-pay

## 2023-03-24 ENCOUNTER — Emergency Department (HOSPITAL_COMMUNITY)
Admission: EM | Admit: 2023-03-24 | Discharge: 2023-03-24 | Disposition: A | Discharge status: Home / Self Care | Payer: Medicare Other | Attending: Emergency Medicine | Admitting: Emergency Medicine

## 2023-03-24 DIAGNOSIS — Z91040 Latex allergy status: Secondary | ICD-10-CM | POA: Diagnosis not present

## 2023-03-24 DIAGNOSIS — Z7902 Long term (current) use of antithrombotics/antiplatelets: Secondary | ICD-10-CM | POA: Insufficient documentation

## 2023-03-24 DIAGNOSIS — G930 Cerebral cysts: Secondary | ICD-10-CM | POA: Diagnosis not present

## 2023-03-24 DIAGNOSIS — I2510 Atherosclerotic heart disease of native coronary artery without angina pectoris: Secondary | ICD-10-CM | POA: Diagnosis not present

## 2023-03-24 DIAGNOSIS — Z7982 Long term (current) use of aspirin: Secondary | ICD-10-CM | POA: Insufficient documentation

## 2023-03-24 DIAGNOSIS — E119 Type 2 diabetes mellitus without complications: Secondary | ICD-10-CM | POA: Diagnosis not present

## 2023-03-24 DIAGNOSIS — D72829 Elevated white blood cell count, unspecified: Secondary | ICD-10-CM | POA: Insufficient documentation

## 2023-03-24 DIAGNOSIS — R0689 Other abnormalities of breathing: Secondary | ICD-10-CM | POA: Diagnosis not present

## 2023-03-24 DIAGNOSIS — I509 Heart failure, unspecified: Secondary | ICD-10-CM | POA: Diagnosis not present

## 2023-03-24 DIAGNOSIS — R410 Disorientation, unspecified: Secondary | ICD-10-CM | POA: Diagnosis not present

## 2023-03-24 DIAGNOSIS — I700 Atherosclerosis of aorta: Secondary | ICD-10-CM | POA: Diagnosis not present

## 2023-03-24 DIAGNOSIS — Z79899 Other long term (current) drug therapy: Secondary | ICD-10-CM | POA: Insufficient documentation

## 2023-03-24 DIAGNOSIS — Z955 Presence of coronary angioplasty implant and graft: Secondary | ICD-10-CM | POA: Diagnosis not present

## 2023-03-24 DIAGNOSIS — R338 Other retention of urine: Secondary | ICD-10-CM | POA: Diagnosis not present

## 2023-03-24 DIAGNOSIS — I672 Cerebral atherosclerosis: Secondary | ICD-10-CM | POA: Diagnosis not present

## 2023-03-24 DIAGNOSIS — I6782 Cerebral ischemia: Secondary | ICD-10-CM | POA: Diagnosis not present

## 2023-03-24 DIAGNOSIS — I10 Essential (primary) hypertension: Secondary | ICD-10-CM | POA: Diagnosis not present

## 2023-03-24 DIAGNOSIS — R4182 Altered mental status, unspecified: Secondary | ICD-10-CM | POA: Diagnosis not present

## 2023-03-24 DIAGNOSIS — I959 Hypotension, unspecified: Secondary | ICD-10-CM | POA: Diagnosis not present

## 2023-03-24 DIAGNOSIS — I110 Hypertensive heart disease with heart failure: Secondary | ICD-10-CM | POA: Diagnosis not present

## 2023-03-24 LAB — COMPREHENSIVE METABOLIC PANEL
ALT: 25 U/L (ref 0–44)
AST: 30 U/L (ref 15–41)
Albumin: 3.5 g/dL (ref 3.5–5.0)
Alkaline Phosphatase: 42 U/L (ref 38–126)
Anion gap: 15 (ref 5–15)
BUN: 33 mg/dL — ABNORMAL HIGH (ref 8–23)
CO2: 23 mmol/L (ref 22–32)
Calcium: 9.4 mg/dL (ref 8.9–10.3)
Chloride: 103 mmol/L (ref 98–111)
Creatinine, Ser: 1.72 mg/dL — ABNORMAL HIGH (ref 0.61–1.24)
GFR, Estimated: 40 mL/min — ABNORMAL LOW (ref >60)
Glucose, Bld: 111 mg/dL — ABNORMAL HIGH (ref 70–99)
Potassium: 4 mmol/L (ref 3.5–5.1)
Sodium: 141 mmol/L (ref 135–145)
Total Bilirubin: 1.1 mg/dL (ref 0.3–1.2)
Total Protein: 7 g/dL (ref 6.5–8.1)

## 2023-03-24 LAB — URINALYSIS, MICROSCOPIC (REFLEX): RBC / HPF: >50 RBC/hpf (ref 0–5)

## 2023-03-24 LAB — CBC
HCT: 37 % — ABNORMAL LOW (ref 39.0–52.0)
Hemoglobin: 12.3 g/dL — ABNORMAL LOW (ref 13.0–17.0)
MCH: 30.4 pg (ref 26.0–34.0)
MCHC: 33.2 g/dL (ref 30.0–36.0)
MCV: 91.4 fL (ref 80.0–100.0)
Platelets: 219 10*3/uL (ref 150–400)
RBC: 4.05 MIL/uL — ABNORMAL LOW (ref 4.22–5.81)
RDW: 12.6 % (ref 11.5–15.5)
WBC: 11.1 10*3/uL — ABNORMAL HIGH (ref 4.0–10.5)
nRBC: 0 % (ref 0.0–0.2)

## 2023-03-24 LAB — URINALYSIS, ROUTINE W REFLEX MICROSCOPIC

## 2023-03-24 LAB — AMMONIA: Ammonia: 17 umol/L (ref 9–35)

## 2023-03-24 LAB — TROPONIN I (HIGH SENSITIVITY)
Troponin I (High Sensitivity): 58 ng/L — ABNORMAL HIGH (ref <18)
Troponin I (High Sensitivity): 59 ng/L — ABNORMAL HIGH (ref <18)

## 2023-03-24 MED ORDER — SODIUM CHLORIDE 0.9 % IV SOLN
Freq: Once | INTRAVENOUS | Status: AC
  Administered 2023-03-24: 250 mL via INTRAVENOUS

## 2023-03-24 NOTE — ED Provider Notes (Cosign Needed Addendum)
[place] EMERGENCY DEPARTMENT AT [facility] Provider Note   CSN: [id number] Arrival date & time: 03/24/23  1805     History  Chief Complaint  Patient presents with   Altered Mental Status    Damon Walker is a 77 y.o. 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  Patient presents to ED for evaluation of altered mental status.  Patient here with wife who provides history.  Per patient wife, the patient has had issues with his indwelling Foley catheter becoming clogged over the last couple of days.  Patient also has had issues with constipation over the last couple of days.  The patient wife reports that she has been giving him MiraLAX for his constipation.  She states that today they went to the urologist office for his clogged Foley catheter, had his clots removed and was sent back home.  Patient wife reports that the patient was taking a nap on the couch.  She states that she realized that he had a very large stool while sleeping, watery diarrhea.  She reports that when she tried to wake the patient up because of this, the patient was seemingly very confused.  He was reaching for things that were not there, he was attempting to drink a water bottle with a  cap still on it.  Patient wife reports that this lasted for about 10 minutes and then resolved.  The patient wife reports the patient is currently back to his baseline mental status.  For me the patient is alert and oriented to person, place, situation.  Patient denies any chest pain, shortness of breath, abdominal pain, nausea vomiting, one-sided weakness or numbness.   Altered Mental Status Associated symptoms: no abdominal pain, no fever, no light-headedness, no nausea, no seizures, no vomiting and no weakness        Home Medications Prior to Admission  medications   Medication Sig Start Date End Date Taking? Authorizing Provider  aspirin EC 81 MG tablet Take 1 tablet (81 mg total) by mouth daily. 05/07/22 06/01/23 Yes Love, Evlyn Kanner, PA-C  carvedilol (COREG) 3.125 MG tablet Take 1 tablet (3.125 mg total) by mouth 2 (two) times daily. 09/02/22  Yes Marykay Lex, MD  clopidogrel (PLAVIX) 75 MG tablet Take 1 tablet (75 mg total) by mouth daily. 05/07/22  Yes Love, Evlyn Kanner, PA-C  ferrous sulfate 325 (65 FE) MG tablet Take 1 tablet (325 mg total) by mouth daily with breakfast. 05/07/22  Yes Love, Evlyn Kanner, PA-C  losartan (COZAAR) 100 MG tablet Take 1 tablet (100 mg total) by mouth daily. 09/02/22  Yes Marykay Lex, MD  pantoprazole (PROTONIX) 20 MG tablet Take 1 tablet (20 mg total) by mouth every other day. 05/07/22  Yes Love, Evlyn Kanner, PA-C  rosuvastatin (CRESTOR) 40 MG tablet Take 1 tablet (40 mg total) by mouth daily at 6 PM. 05/07/22  Yes Love, Evlyn Kanner, PA-C  sertraline (ZOLOFT) 25 MG tablet Take 0.5 tablets (12.5 mg total) by mouth at bedtime. 05/07/22  Yes Love, Evlyn Kanner, PA-C  traZODone (DESYREL) 50 MG tablet Take 1 tablet (50 mg total) by mouth at bedtime. 05/07/22  Yes Love, Evlyn Kanner, PA-C  acetaminophen (TYLENOL) 325 MG tablet Take 1-2 tablets (325-650 mg total) by mouth every 4 (four) hours as needed for mild pain. 05/07/22   Love, Evlyn Kanner, PA-C  famotidine (PEPCID) 40 MG  tablet Take 1 tablet (40 mg total) by mouth every other day. 05/07/22   Love, Evlyn Kanner, PA-C  furosemide (LASIX) 20 MG tablet TAKE 1 TABLET BY MOUTH ONCE DAILY AS NEEDED 02/25/23   Marykay Lex, MD  nitroGLYCERIN (NITROSTAT) 0.4 MG SL tablet Place 1 tablet (0.4 mg total) under the tongue every 5 (five) minutes x 3 doses as needed for chest pain. 03/05/23   Cannon Kettle, PA-C      Allergies    Latex    Review of Systems   Review of Systems  Constitutional:  Negative for fever.  Respiratory:  Negative for shortness of breath.   Cardiovascular:  Negative for  chest pain.  Gastrointestinal:  Positive for constipation and diarrhea. Negative for abdominal pain, nausea and vomiting.  Neurological:  Negative for dizziness, seizures, syncope, facial asymmetry, speech difficulty, weakness and light-headedness.  All other systems reviewed and are negative.   Physical Exam Updated Vital Signs BP (!) 136/53   Pulse (!) 48   Temp 98.1 F (36.7 C) (Oral)   Resp 20   Ht 5\' 7"  (1.702 m)   Wt 79.4 kg   SpO2 98%   BMI 27.41 kg/m  Physical Exam Vitals and nursing note reviewed.  Constitutional:      General: He is not in acute distress.    Appearance: Normal appearance. He is not ill-appearing, toxic-appearing or diaphoretic.  HENT:     Head: Normocephalic and atraumatic.     Nose: Nose normal.     Mouth/Throat:     Mouth: Mucous membranes are dry.     Pharynx: Oropharynx is clear.  Eyes:     Extraocular Movements: Extraocular movements intact.     Conjunctiva/sclera: Conjunctivae normal.     Pupils: Pupils are equal, round, and reactive to light.  Cardiovascular:     Rate and Rhythm: Normal rate and regular rhythm.  Pulmonary:     Effort: Pulmonary effort is normal.     Breath sounds: Normal breath sounds. No wheezing.  Abdominal:     General: Abdomen is flat. Bowel sounds are normal.     Palpations: Abdomen is soft.     Tenderness: There is no abdominal tenderness.  Musculoskeletal:     Cervical back: Normal range of motion and neck supple. No tenderness.  Skin:    General: Skin is warm and dry.     Capillary Refill: Capillary refill takes less than 2 seconds.  Neurological:     General: No focal deficit present.     Mental Status: He is alert and oriented to person, place, and time.     GCS: GCS eye subscore is 4. GCS verbal subscore is 5. GCS motor subscore is 6.     Cranial Nerves: Cranial nerves 2-12 are intact. No cranial nerve deficit.     Sensory: Sensation is intact. No sensory deficit.     Motor: Motor function is intact. No  weakness.     Coordination: Coordination is intact. Heel to [facility] Test normal.     Comments: No focal neurodeficits on examination.  CN II through XII intact.  Alert and oriented.  5 out of 5 strength to bilateral lower extremities.     ED Results / Procedures / Treatments   Labs (all labs ordered are listed, but only abnormal results are displayed) Labs Reviewed  CBC - Abnormal; Notable for the following components:      Result Value   WBC 11.1 (*)    RBC 4.05 (*)  Hemoglobin 12.3 (*)    HCT 37.0 (*)    All other components within normal limits  URINALYSIS, ROUTINE W REFLEX MICROSCOPIC - Abnormal; Notable for the following components:   Color, Urine RED (*)    APPearance TURBID (*)    Glucose, UA   (*)    Value: TEST NOT REPORTED DUE TO COLOR INTERFERENCE OF URINE PIGMENT   Hgb urine dipstick   (*)    Value: TEST NOT REPORTED DUE TO COLOR INTERFERENCE OF URINE PIGMENT   Bilirubin Urine   (*)    Value: TEST NOT REPORTED DUE TO COLOR INTERFERENCE OF URINE PIGMENT   Ketones, ur   (*)    Value: TEST NOT REPORTED DUE TO COLOR INTERFERENCE OF URINE PIGMENT   Protein, ur   (*)    Value: TEST NOT REPORTED DUE TO COLOR INTERFERENCE OF URINE PIGMENT   Nitrite   (*)    Value: TEST NOT REPORTED DUE TO COLOR INTERFERENCE OF URINE PIGMENT   Leukocytes,Ua   (*)    Value: TEST NOT REPORTED DUE TO COLOR INTERFERENCE OF URINE PIGMENT   All other components within normal limits  COMPREHENSIVE METABOLIC PANEL - Abnormal; Notable for the following components:   Glucose, Bld 111 (*)    BUN 33 (*)    Creatinine, Ser 1.72 (*)    GFR, Estimated 40 (*)    All other components within normal limits  URINALYSIS, MICROSCOPIC (REFLEX) - Abnormal; Notable for the following components:   Bacteria, UA RARE (*)    All other components within normal limits  TROPONIN I (HIGH SENSITIVITY) - Abnormal; Notable for the following components:   Troponin I (High Sensitivity) 58 (*)    All other components within  normal limits  TROPONIN I (HIGH SENSITIVITY) - Abnormal; Notable for the following components:   Troponin I (High Sensitivity) 59 (*)    All other components within normal limits  URINE CULTURE  AMMONIA    EKG EKG Interpretation Date/Time:  Monday March 24 2023 18:16:57 EDT Ventricular Rate:  68 PR Interval:  208 QRS Duration:  98 QT Interval:  445 QTC Calculation: 474 R Axis:   -25  Text Interpretation: Sinus rhythm Borderline left axis deviation Anterior infarct, old Lateral ST depression similar to prior Confirmed by Fulton Reek [phone]) on 03/24/2023 6:41:25 PM  Radiology DG Chest 2 View  Result Date: 03/24/2023 CLINICAL DATA:  Confusion and incontinence. EXAM: CHEST - 2 VIEW COMPARISON:  Radiograph 04/22/2022 FINDINGS: Stable cardiomediastinal silhouette. Aortic atherosclerotic calcification. Coronary stenting. No focal consolidation, pleural effusion, or pneumothorax. No displaced rib fractures. IMPRESSION: No active cardiopulmonary disease. Electronically Signed   By: Minerva Fester M.D.   On: 03/24/2023 20:51   CT Head Wo Contrast  Result Date: 03/24/2023 CLINICAL DATA:  Mental status change EXAM: CT HEAD WITHOUT CONTRAST TECHNIQUE: Contiguous axial images were obtained from the base of the skull through the vertex without intravenous contrast. RADIATION DOSE REDUCTION: This exam was performed according to the departmental dose-optimization program which includes automated exposure control, adjustment of the mA and/or kV according to patient size and/or use of iterative reconstruction technique. COMPARISON:  MRI head 04/21/2022 and CT head 04/21/2022 FINDINGS: Brain: No intracranial hemorrhage, mass effect, or evidence of acute infarct. No hydrocephalus. No extra-axial fluid collection. Age-commensurate cerebral atrophy and ill-defined hypoattenuation within the cerebral white matter consistent with chronic small vessel ischemic disease. Chronic infarct left parietal-occipital  and right parietal lobes. Right temporal arachnoid cyst. Vascular: No hyperdense vessel. Intracranial arterial  calcification. Skull: No fracture or focal lesion. Sinuses/Orbits: No acute finding. Chronic bilateral mastoid effusions. Other: None. IMPRESSION: 1. No acute intracranial abnormality. 2. Chronic small vessel ischemic disease and chronic infarcts. Electronically Signed   By: Minerva Fester M.D.   On: 03/24/2023 20:47    Procedures Procedures   Medications Ordered in ED Medications  0.9 %  sodium chloride infusion (0 mLs Intravenous Stopped 03/24/23 2113)    ED Course/ Medical Decision Making/ A&P  Medical Decision Making Amount and/or Complexity of Data Reviewed Labs: ordered. Radiology: ordered.  Risk Prescription drug management.   77 year old male presents to ED for evaluation.  Please see HPI for further details.  On examination the patient is afebrile, nontachycardic.  His lung sounds are clear bilaterally and he is nonhypoxic.  His abdomen is soft and compressible throughout, no tenderness.  Neurological examination is at baseline, no focal neurodeficits, 5 out of 5 strength bilateral lower extremities.  He does have a indwelling Foley catheter on the leg bag attached which shows blood-tinged urine.  Patient CBC shows leukocytosis 11.1, baseline hemoglobin 12.3.  Patient CMP shows no electrolyte derangement, glucose 111, BUN 33, creatinine 1.72.  Patient creatinine 6 days ago was 1.4.  Patient states that he did retain urine recently, this could be cause of elevated creatinine.  Did provide 250 mL of fluid, patient does have CHF and does have a significantly reduced ejection fraction.  Patient ammonia 17.  Patient urinalysis is very blood-tinged and turbid.  Will culture the patient urine.  His troponin was 58 with a delta 59.  His EKG is unchanged from prior.  Patient CT head unremarkable.  Patient chest x-ray unremarkable.  At this time the patient is at his baseline  mental status.  Patient and patient wife feel comfortable with patient going home.  The patient will follow-up with his PCP this week for reevaluation.  I advised the patient and his wife to return to the ED with any new or worsening signs or symptoms and they voiced understanding.  They had all their questions answered to their satisfaction.  The patient is stable to discharge at this time.  Patient case discussed with attending Dr. Earlene Plater who voices agreement with plan of management.   Final Clinical Impression(s) / ED Diagnoses Final diagnoses:  Confusion    Rx / DC Orders ED Discharge Orders     None             Al Decant, PA-C 03/24/23 2314    Laurence Spates, MD 03/25/23 1451

## 2023-03-24 NOTE — ED Provider Notes (Incomplete)
Hx cirrhosis, prior CVA.  Constipated on Miralax.  Foley not draining well until urology today.  At home large BM.  Confused when woke up for 10 minutes.  Still disoriented to year.

## 2023-03-24 NOTE — ED Triage Notes (Signed)
PT BIB EMS From home after pt had episode of confusion and loss of stool control upon waking up from a nap. Pt was previously at urology apportionment for foley catheter bleeding (which was to be normal). Pt has HX of CVA. Upon EMS arrival pt returned to baseline per pt's wife.   EMS 104/49 75 (bblocker) 96% RA BG 211 Cap 25

## 2023-03-24 NOTE — Discharge Instructions (Addendum)
It was a pleasure taking part in your care today.  As we discussed, your workup was reassuring.  I would like you to follow-up with your PCP this week for reevaluation.  Please continue taking all medications as prescribed.  Please return to the ED with any new or worsening signs or symptoms.

## 2023-03-25 DIAGNOSIS — R410 Disorientation, unspecified: Secondary | ICD-10-CM | POA: Diagnosis not present

## 2023-03-25 LAB — URINE CULTURE

## 2023-03-28 DIAGNOSIS — R5383 Other fatigue: Secondary | ICD-10-CM | POA: Diagnosis not present

## 2023-03-28 DIAGNOSIS — N401 Enlarged prostate with lower urinary tract symptoms: Secondary | ICD-10-CM | POA: Diagnosis not present

## 2023-03-28 DIAGNOSIS — N3000 Acute cystitis without hematuria: Secondary | ICD-10-CM | POA: Diagnosis not present

## 2023-03-28 DIAGNOSIS — R3914 Feeling of incomplete bladder emptying: Secondary | ICD-10-CM | POA: Diagnosis not present

## 2023-03-28 DIAGNOSIS — R338 Other retention of urine: Secondary | ICD-10-CM | POA: Diagnosis not present

## 2023-04-16 DIAGNOSIS — N403 Nodular prostate with lower urinary tract symptoms: Secondary | ICD-10-CM | POA: Diagnosis not present

## 2023-04-16 DIAGNOSIS — R338 Other retention of urine: Secondary | ICD-10-CM | POA: Diagnosis not present

## 2023-04-16 DIAGNOSIS — N3000 Acute cystitis without hematuria: Secondary | ICD-10-CM | POA: Diagnosis not present

## 2023-04-17 DIAGNOSIS — Z79899 Other long term (current) drug therapy: Secondary | ICD-10-CM | POA: Diagnosis not present

## 2023-04-23 DIAGNOSIS — K8020 Calculus of gallbladder without cholecystitis without obstruction: Secondary | ICD-10-CM | POA: Diagnosis not present

## 2023-04-23 DIAGNOSIS — R310 Gross hematuria: Secondary | ICD-10-CM | POA: Diagnosis not present

## 2023-04-23 DIAGNOSIS — N3090 Cystitis, unspecified without hematuria: Secondary | ICD-10-CM | POA: Diagnosis not present

## 2023-04-23 DIAGNOSIS — R590 Localized enlarged lymph nodes: Secondary | ICD-10-CM | POA: Diagnosis not present

## 2023-04-25 DIAGNOSIS — N401 Enlarged prostate with lower urinary tract symptoms: Secondary | ICD-10-CM | POA: Diagnosis not present

## 2023-04-25 DIAGNOSIS — N403 Nodular prostate with lower urinary tract symptoms: Secondary | ICD-10-CM | POA: Diagnosis not present

## 2023-04-25 DIAGNOSIS — R3914 Feeling of incomplete bladder emptying: Secondary | ICD-10-CM | POA: Diagnosis not present

## 2023-04-30 ENCOUNTER — Telehealth: Payer: Self-pay

## 2023-04-30 ENCOUNTER — Telehealth: Payer: Self-pay | Admitting: Cardiology

## 2023-04-30 ENCOUNTER — Other Ambulatory Visit: Payer: Self-pay | Admitting: Urology

## 2023-04-30 ENCOUNTER — Other Ambulatory Visit (HOSPITAL_COMMUNITY): Payer: Self-pay | Admitting: Urology

## 2023-04-30 DIAGNOSIS — R972 Elevated prostate specific antigen [PSA]: Secondary | ICD-10-CM

## 2023-04-30 NOTE — Telephone Encounter (Signed)
Patient scheduled for tele visit on 05/14/23 for tele visit. Med rec and consent done

## 2023-04-30 NOTE — Telephone Encounter (Signed)
Pre-operative Risk Assessment    Patient Name: Damon Walker  DOB: [birth year] MRN: [id number]      Request for Surgical Clearance    Procedure:   TURP and Prostate Byopsy   Date of Surgery:  Clearance 05/27/23                                 Surgeon:  Dr. Mena Goes  Surgeon's Group or Practice Name:  [facility]  Phone number:  [phone] Fax number:  [phone]   Type of Clearance Requested:   - Medical  - Pharmacy:  Hold Aspirin AND PLAVIX for 5 days    Type of Anesthesia:  General    Additional requests/questions:      [name]   04/30/2023, 1:11 PM

## 2023-04-30 NOTE — Telephone Encounter (Signed)
  Patient Consent for Virtual Visit         Damon Walker has provided verbal consent on 04/30/2023 for a virtual visit (video or telephone).   CONSENT FOR VIRTUAL VISIT FOR:  Damon Walker  By participating in this virtual visit I agree to the following:  I hereby voluntarily request, consent and authorize [facility] and its employed or contracted physicians, physician assistants, nurse practitioners or other licensed health care professionals (the Practitioner), to provide me with telemedicine health care services (the "Services") as deemed necessary by the treating Practitioner. I acknowledge and consent to receive the Services by the Practitioner via telemedicine. I understand that the telemedicine visit will involve communicating with the Practitioner through live audiovisual communication technology and the disclosure of certain medical information by electronic transmission. I acknowledge that I have been given the opportunity to request an in-person assessment or other available alternative prior to the telemedicine visit and am voluntarily participating in the telemedicine visit.  I understand that I have the right to withhold or withdraw my consent to the use of telemedicine in the course of my care at any time, without affecting my right to future care or treatment, and that the Practitioner or I may terminate the telemedicine visit at any time. I understand that I have the right to inspect all information obtained and/or recorded in the course of the telemedicine visit and may receive copies of available information for a reasonable fee.  I understand that some of the potential risks of receiving the Services via telemedicine include:  Delay or interruption in medical evaluation due to technological equipment failure or disruption; Information transmitted may not be sufficient (e.g. poor resolution of images) to allow for appropriate medical decision making by the  Practitioner; and/or  In rare instances, security protocols could fail, causing a breach of personal health information.  Furthermore, I acknowledge that it is my responsibility to provide information about my medical history, conditions and care that is complete and accurate to the best of my ability. I acknowledge that Practitioner's advice, recommendations, and/or decision may be based on factors not within their control, such as incomplete or inaccurate data provided by me or distortions of diagnostic images or specimens that may result from electronic transmissions. I understand that the practice of medicine is not an exact science and that Practitioner makes no warranties or guarantees regarding treatment outcomes. I acknowledge that a copy of this consent can be made available to me via my patient portal [facility] MyChart), or I can request a printed copy by calling the office of  [facility].    I understand that my insurance will be billed for this visit.   I have read or had this consent read to me. I understand the contents of this consent, which adequately explains the benefits and risks of the Services being provided via telemedicine.  I have been provided ample opportunity to ask questions regarding this consent and the Services and have had my questions answered to my satisfaction. I give my informed consent for the services to be provided through the use of telemedicine in my medical care

## 2023-04-30 NOTE — Telephone Encounter (Signed)
Name: Damon Walker  DOB: 1946/03/08  MRN: [id number]  Primary Cardiologist: Bryan Lemma, MD   Preoperative team, please contact this patient and set up a phone call appointment for further preoperative risk assessment. Please obtain consent and complete medication review. Thank you for your help.  I confirm that guidance regarding antiplatelet and oral anticoagulation therapy has been completed and, if necessary, noted below.  Patients DAPT is no prescribed by cardiology. Recommendations for holding DAPT will need to come from prescribing provider.    Ronney Asters, NP 04/30/2023, 1:31 PM [facility]

## 2023-05-03 ENCOUNTER — Other Ambulatory Visit: Payer: Self-pay

## 2023-05-03 ENCOUNTER — Emergency Department (HOSPITAL_COMMUNITY)
Admission: EM | Admit: 2023-05-03 | Discharge: 2023-05-03 | Disposition: A | Discharge status: Home / Self Care | Payer: Medicare Other | Attending: Emergency Medicine | Admitting: Emergency Medicine

## 2023-05-03 DIAGNOSIS — R310 Gross hematuria: Secondary | ICD-10-CM | POA: Diagnosis not present

## 2023-05-03 DIAGNOSIS — R319 Hematuria, unspecified: Secondary | ICD-10-CM | POA: Diagnosis not present

## 2023-05-03 DIAGNOSIS — I5042 Chronic combined systolic (congestive) and diastolic (congestive) heart failure: Secondary | ICD-10-CM | POA: Diagnosis not present

## 2023-05-03 DIAGNOSIS — D72829 Elevated white blood cell count, unspecified: Secondary | ICD-10-CM | POA: Diagnosis not present

## 2023-05-03 DIAGNOSIS — Z7982 Long term (current) use of aspirin: Secondary | ICD-10-CM | POA: Diagnosis not present

## 2023-05-03 DIAGNOSIS — Z91040 Latex allergy status: Secondary | ICD-10-CM | POA: Diagnosis not present

## 2023-05-03 DIAGNOSIS — Z7902 Long term (current) use of antithrombotics/antiplatelets: Secondary | ICD-10-CM | POA: Insufficient documentation

## 2023-05-03 DIAGNOSIS — N3001 Acute cystitis with hematuria: Secondary | ICD-10-CM

## 2023-05-03 DIAGNOSIS — I110 Hypertensive heart disease with heart failure: Secondary | ICD-10-CM | POA: Diagnosis not present

## 2023-05-03 DIAGNOSIS — I2510 Atherosclerotic heart disease of native coronary artery without angina pectoris: Secondary | ICD-10-CM | POA: Insufficient documentation

## 2023-05-03 DIAGNOSIS — E119 Type 2 diabetes mellitus without complications: Secondary | ICD-10-CM | POA: Insufficient documentation

## 2023-05-03 DIAGNOSIS — Z79899 Other long term (current) drug therapy: Secondary | ICD-10-CM | POA: Insufficient documentation

## 2023-05-03 DIAGNOSIS — R339 Retention of urine, unspecified: Secondary | ICD-10-CM | POA: Diagnosis not present

## 2023-05-03 DIAGNOSIS — R338 Other retention of urine: Secondary | ICD-10-CM

## 2023-05-03 LAB — CBC WITH DIFFERENTIAL/PLATELET
Abs Immature Granulocytes: 0.05 10*3/uL (ref 0.00–0.07)
Basophils Absolute: 0.1 10*3/uL (ref 0.0–0.1)
Basophils Relative: 1 %
Eosinophils Absolute: 0.2 10*3/uL (ref 0.0–0.5)
Eosinophils Relative: 1 %
HCT: 37.7 % — ABNORMAL LOW (ref 39.0–52.0)
Hemoglobin: 12 g/dL — ABNORMAL LOW (ref 13.0–17.0)
Immature Granulocytes: 0 %
Lymphocytes Relative: 12 %
Lymphs Abs: 1.5 10*3/uL (ref 0.7–4.0)
MCH: 29.6 pg (ref 26.0–34.0)
MCHC: 31.8 g/dL (ref 30.0–36.0)
MCV: 93.1 fL (ref 80.0–100.0)
Monocytes Absolute: 0.6 10*3/uL (ref 0.1–1.0)
Monocytes Relative: 5 %
Neutro Abs: 10 10*3/uL — ABNORMAL HIGH (ref 1.7–7.7)
Neutrophils Relative %: 81 %
Platelets: 253 10*3/uL (ref 150–400)
RBC: 4.05 MIL/uL — ABNORMAL LOW (ref 4.22–5.81)
RDW: 13.5 % (ref 11.5–15.5)
WBC: 12.3 10*3/uL — ABNORMAL HIGH (ref 4.0–10.5)
nRBC: 0 % (ref 0.0–0.2)

## 2023-05-03 LAB — URINALYSIS, ROUTINE W REFLEX MICROSCOPIC
Bilirubin Urine: NEGATIVE
Glucose, UA: NEGATIVE mg/dL
Ketones, ur: NEGATIVE mg/dL
Nitrite: NEGATIVE
Protein, ur: 100 mg/dL — AB
RBC / HPF: >50 RBC/hpf (ref 0–5)
Specific Gravity, Urine: 1.014 (ref 1.005–1.030)
pH: 5 (ref 5.0–8.0)

## 2023-05-03 LAB — BASIC METABOLIC PANEL
Anion gap: 9 (ref 5–15)
BUN: 21 mg/dL (ref 8–23)
CO2: 23 mmol/L (ref 22–32)
Calcium: 9.1 mg/dL (ref 8.9–10.3)
Chloride: 107 mmol/L (ref 98–111)
Creatinine, Ser: 1.39 mg/dL — ABNORMAL HIGH (ref 0.61–1.24)
GFR, Estimated: 52 mL/min — ABNORMAL LOW (ref >60)
Glucose, Bld: 140 mg/dL — ABNORMAL HIGH (ref 70–99)
Potassium: 4.6 mmol/L (ref 3.5–5.1)
Sodium: 139 mmol/L (ref 135–145)

## 2023-05-03 MED ORDER — TAMSULOSIN HCL 0.4 MG PO CAPS: 0.4000 mg | ORAL_CAPSULE | Freq: Every day | ORAL | 0 refills | Status: DC

## 2023-05-03 MED ORDER — CEPHALEXIN 500 MG PO CAPS: 500.0000 mg | ORAL_CAPSULE | Freq: Four times a day (QID) | ORAL | 0 refills | Status: DC

## 2023-05-03 MED ORDER — LIDOCAINE HCL (PF) 1 % IJ SOLN
INTRAMUSCULAR | Status: AC
  Filled 2023-05-03: qty 5

## 2023-05-03 MED ORDER — CEFTRIAXONE SODIUM 1 G IJ SOLR
1.0000 g | Freq: Once | INTRAMUSCULAR | Status: AC
  Administered 2023-05-03: 1 g via INTRAMUSCULAR
  Filled 2023-05-03: qty 10

## 2023-05-03 NOTE — ED Triage Notes (Addendum)
Patient with foley catheter for urinary retention and arrives today for constant pain to groin since earlier tonght. Normal output from catheter through yesterday but none since last night. Hematuria ongoing.

## 2023-05-03 NOTE — ED Provider Notes (Signed)
[place] EMERGENCY DEPARTMENT AT [facility] Provider Note   CSN: [id number] Arrival date & time: 05/03/23  0319     History  Chief Complaint  Patient presents with   Groin Pain   Hematuria    Damon Walker is a 77 y.o. male.  The history is provided by the patient.  Groin Pain This is a recurrent problem. The current episode started 6 to 12 hours ago. The problem occurs constantly. The problem has not changed since onset.Pertinent negatives include no chest pain, no headaches and no shortness of breath. Associated symptoms comments: Foley catheter not draining . Nothing aggravates the symptoms. Nothing relieves the symptoms. The treatment provided no relief.  Hematuria Pertinent negatives include no chest pain, no headaches and no shortness of breath.  Patient with urinary retention with indwelling foley with pain and hematuria and no urinary output for hours.    Past Medical History:  Diagnosis Date   Carotid arterial disease (HCC) 06/2014; 01/09/2015   Followed by Dr. Arbie Cookey of [facility] -- a) Carotid u/s: 40-59% bilat ICA stenosis; b) 10/2019: Carotid Dopplers October 20, 2019: R ICA 1-39%, LICA 40-59%.  Right vertebral artery appears occluded.  Normal subclavian flow.  Normal left vertebral artery. 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  MRI Brain: Acute L Occipital Lobe Infarct. Chronic microvascular ischemic changes in the white matter and left pons.  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   Chronic combined systolic and diastolic congestive heart failure, NYHA class 2 (HCC) 05/11/2015 - 05/15/15   a. EF 20-25% with anterior-anteroseptal akinesis (immediately post anterior STEMI).  ; b. 10/10/'16 Echo: EF 35-40% with moderate  mid-apical anteroseptal, anterior and apical hypokinesis.   Coronary artery disease involving native heart 05/11/2015   Cath: 3V CAD with 90% prox RCA, 80% mid RCA, 80% OM3, (RCA and OM residual treated medically) 100% LAD - PCI to Prox-Mid LAD with Overlapping Promus Premier DES 3.0 x 38 & 3.0 x 16 (post-dilated to 3.5 mm)    Essential hypertension    H/O: GI bleed 05/15/2015   s/p Sigmoid Polypectomy 05/04/2015 - prior to STEMI on May 11, 2015; Had GI Bleed following TPA for CVA, while on ASA & Brilinta; Flex Sig - 1. Internal Hemorrhoids, 2. Distal sigmoid polypectomy site with flat Whitebase status post biopsy and tattoo with 2mL spot over 3 injections 3. Proximal sigmoid polypectomy site seen as well, 4) otherwise normal with no evidence of bleed   Hyperlipidemia with target LDL less than 70    Ischemic cardiomyopathy 05/11/2015   EF 40% on cath 05/11/2015 after anterior STEMI, EF 20-25% on echo 05/13/2015; 12/2015 =  EF ~45%.   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   Prediabetes Oct 2016   A1C 6.0 in Oct 2016   ST elevation myocardial infarction (STEMI) involving left anterior descending (LAD) coronary artery with complication (HCC) 05/11/2015   100% Prox LAD - PCI with overlapping DES x 2   Tobacco abuse    a. 30 yrs - 1.5 ppd. - Quit 05/11/2015   Type 2 diabetes mellitus with other specified complication (HCC) 10/23/2021   Vision abnormalities    Right hemianopsia     Home  Medications Prior to Admission medications   Medication Sig Start Date End Date Taking? Authorizing Provider  cephALEXin (KEFLEX) 500 MG capsule Take 1 capsule (500 mg total) by mouth 4 (four) times daily. 05/03/23  Yes Garth Diffley, MD  tamsulosin (FLOMAX) 0.4 MG CAPS capsule Take 1 capsule (0.4 mg total) by mouth daily. 05/03/23  Yes Charles Andringa, MD  acetaminophen (TYLENOL) 325 MG tablet Take 1-2 tablets (325-650 mg total) by mouth every  4 (four) hours as needed for mild pain. 05/07/22   Love, Evlyn Kanner, PA-C  aspirin EC 81 MG tablet Take 1 tablet (81 mg total) by mouth daily. 05/07/22 06/01/23  Love, Evlyn Kanner, PA-C  carvedilol (COREG) 3.125 MG tablet Take 1 tablet (3.125 mg total) by mouth 2 (two) times daily. 09/02/22   Marykay Lex, MD  clopidogrel (PLAVIX) 75 MG tablet Take 1 tablet (75 mg total) by mouth daily. 05/07/22   Love, Evlyn Kanner, PA-C  famotidine (PEPCID) 40 MG tablet Take 1 tablet (40 mg total) by mouth every other day. 05/07/22   Love, Evlyn Kanner, PA-C  ferrous sulfate 325 (65 FE) MG tablet Take 1 tablet (325 mg total) by mouth daily with breakfast. 05/07/22   Love, Evlyn Kanner, PA-C  finasteride (PROSCAR) 5 MG tablet Take 5 mg by mouth at bedtime. 03/20/23   [provider]  furosemide (LASIX) 20 MG tablet TAKE 1 TABLET BY MOUTH ONCE DAILY AS NEEDED 02/25/23   Marykay Lex, MD  losartan (COZAAR) 100 MG tablet Take 1 tablet (100 mg total) by mouth daily. 09/02/22   Marykay Lex, MD  nitroGLYCERIN (NITROSTAT) 0.4 MG SL tablet Place 1 tablet (0.4 mg total) under the tongue every 5 (five) minutes x 3 doses as needed for chest pain. 03/05/23   Cannon Kettle, PA-C  pantoprazole (PROTONIX) 20 MG tablet Take 1 tablet (20 mg total) by mouth every other day. 05/07/22   Love, Evlyn Kanner, PA-C  rosuvastatin (CRESTOR) 40 MG tablet Take 1 tablet (40 mg total) by mouth daily at 6 PM. 05/07/22   Love, Evlyn Kanner, PA-C  sertraline (ZOLOFT) 25 MG tablet Take 0.5 tablets (12.5 mg total) by mouth at bedtime. 05/07/22   Love, Evlyn Kanner, PA-C  tamsulosin (FLOMAX) 0.4 MG CAPS capsule Take 0.4 mg by mouth at bedtime. 03/20/23   [provider]  traZODone (DESYREL) 50 MG tablet Take 1 tablet (50 mg total) by mouth at bedtime. 05/07/22   Love, Evlyn Kanner, PA-C      Allergies    Latex    Review of Systems   Review of Systems  Respiratory:  Negative for shortness of breath.   Cardiovascular:  Negative for chest pain.   Genitourinary:  Positive for hematuria.  Neurological:  Negative for headaches.    Physical Exam Updated Vital Signs BP (!) 152/93 (BP Location: Left Arm)   Pulse (!) 112   Temp 97.9 F (36.6 C)   Resp 17   Ht 5\' 7"  (1.702 m)   Wt 79.4 kg   SpO2 99%   BMI 27.41 kg/m  Physical Exam Vitals and nursing note reviewed.  Constitutional:      General: He is not in acute distress.    Appearance: He is well-developed. He is not diaphoretic.  HENT:     Head: Normocephalic and atraumatic.     Nose: Nose normal.  Eyes:     Conjunctiva/sclera: Conjunctivae normal.     Pupils: Pupils are equal, round, and reactive to light.  Cardiovascular:     Rate and Rhythm: Normal rate and regular rhythm.     Pulses: Normal pulses.     Heart sounds: Normal heart sounds.  Pulmonary:     Effort: Pulmonary effort is normal.     Breath sounds: Normal breath sounds. No wheezing or rales.  Abdominal:     General: Bowel sounds are normal.     Palpations: Abdomen is soft.     Tenderness: There is no abdominal tenderness. There is no guarding or rebound.  Musculoskeletal:        General: Normal range of motion.     Cervical back: Normal range of motion and neck supple.  Skin:    General: Skin is warm and dry.     Capillary Refill: Capillary refill takes less than 2 seconds.  Neurological:     General: No focal deficit present.     Mental Status: He is alert and oriented to person, place, and time.     Deep Tendon Reflexes: Reflexes normal.  Psychiatric:        Mood and Affect: Mood normal.     ED Results / Procedures / Treatments   Labs (all labs ordered are listed, but only abnormal results are displayed) Results for orders placed or performed during the hospital encounter of 05/03/23  Urinalysis, Routine w reflex microscopic -Urine, Catheterized; Indwelling urinary catheter  Result Value Ref Range   Color, Urine AMBER (A) YELLOW   APPearance CLOUDY (A) CLEAR   Specific Gravity, Urine  1.014 1.005 - 1.030   pH 5.0 5.0 - 8.0   Glucose, UA NEGATIVE NEGATIVE mg/dL   Hgb urine dipstick LARGE (A) NEGATIVE   Bilirubin Urine NEGATIVE NEGATIVE   Ketones, ur NEGATIVE NEGATIVE mg/dL   Protein, ur 161 (A) NEGATIVE mg/dL   Nitrite NEGATIVE NEGATIVE   Leukocytes,Ua SMALL (A) NEGATIVE   RBC / HPF >50 0 - 5 RBC/hpf   WBC, UA 21-50 0 - 5 WBC/hpf   Bacteria, UA FEW (A) NONE SEEN   Squamous Epithelial / HPF 0-5 0 - 5 /HPF   WBC Clumps PRESENT    Mucus PRESENT   CBC with Differential  Result Value Ref Range   WBC 12.3 (H) 4.0 - 10.5 K/uL   RBC 4.05 (L) 4.22 - 5.81 MIL/uL   Hemoglobin 12.0 (L) 13.0 - 17.0 g/dL   HCT 09.6 (L) 04.5 - 40.9 %   MCV 93.1 80.0 - 100.0 fL   MCH 29.6 26.0 - 34.0 pg   MCHC 31.8 30.0 - 36.0 g/dL   RDW 81.1 91.4 - 78.2 %   Platelets 253 150 - 400 K/uL   nRBC 0.0 0.0 - 0.2 %   Neutrophils Relative % 81 %   Neutro Abs 10.0 (H) 1.7 - 7.7 K/uL   Lymphocytes Relative 12 %   Lymphs Abs 1.5 0.7 - 4.0 K/uL   Monocytes Relative 5 %   Monocytes Absolute 0.6 0.1 - 1.0 K/uL   Eosinophils Relative 1 %   Eosinophils Absolute 0.2 0.0 - 0.5 K/uL   Basophils Relative 1 %   Basophils Absolute 0.1 0.0 - 0.1 K/uL   Immature Granulocytes 0 %   Abs Immature Granulocytes 0.05 0.00 - 0.07 K/uL  Basic metabolic panel  Result Value Ref Range   Sodium 139 135 - 145 mmol/L   Potassium 4.6 3.5 - 5.1 mmol/L   Chloride 107 98 - 111 mmol/L   CO2 23 22 - 32 mmol/L   Glucose, Bld 140 (H)  70 - 99 mg/dL   BUN 21 8 - 23 mg/dL   Creatinine, Ser 9.62 (H) 0.61 - 1.24 mg/dL   Calcium 9.1 8.9 - 95.2 mg/dL   GFR, Estimated 52 (L) >60 mL/min   Anion gap 9 5 - 15   No results found.   EKG None  Radiology No results found.  Procedures Procedures    Medications Ordered in ED Medications  cefTRIAXone (ROCEPHIN) injection 1 g (has no administration in time range)  lidocaine (PF) (XYLOCAINE) 1 % injection (has no administration in time range)    ED Course/ Medical Decision  Making/ A&P                                 Medical Decision Making Urinary retention and hematuria  Amount and/or Complexity of Data Reviewed Independent Historian: spouse Labs: ordered.    Details: Urine is consistent for UTI. White count slight elevation 12.3, low hemoglobin 12, normal platelets.  Normal sodium 139, normal potassium 4.6, creatnine elevated 1.39   Risk Prescription drug management. Risk Details: Patient had foley catheter replaced, draining. Bloody but clearing.  Rocephin given for UTI. Stable for discharge.  Follow up with urology.  Strict return     Final Clinical Impression(s) / ED Diagnoses Final diagnoses:  Gross hematuria  Acute urinary retention   Return for intractable cough, coughing up blood, fevers > 100.4 unrelieved by medication, shortness of breath, intractable vomiting, chest pain, shortness of breath, weakness, numbness, changes in speech, facial asymmetry, abdominal pain, passing out, Inability to tolerate liquids or food, cough, altered mental status or any concerns. No signs of systemic illness or infection. The patient is nontoxic-appearing on exam and vital signs are within normal limits.  I have reviewed the triage vital signs and the nursing notes. Pertinent labs & imaging results that were available during my care of the patient were reviewed by me and considered in my medical decision making (see chart for details). After history, exam, and medical workup I feel the patient has been appropriately medically screened and is safe for discharge home. Pertinent diagnoses were discussed with the patient. Patient was given return precautions.  Rx / DC Orders ED Discharge Orders          Ordered    tamsulosin (FLOMAX) 0.4 MG CAPS capsule  Daily        05/03/23 0533    cephALEXin (KEFLEX) 500 MG capsule  4 times daily        05/03/23 0603              Donye Dauenhauer, MD 05/03/23 [id number]

## 2023-05-04 DIAGNOSIS — R310 Gross hematuria: Secondary | ICD-10-CM | POA: Diagnosis not present

## 2023-05-04 LAB — URINE CULTURE
Culture: NO GROWTH
Special Requests: NORMAL

## 2023-05-14 ENCOUNTER — Ambulatory Visit: Payer: Medicare Other | Attending: Cardiovascular Disease | Admitting: Nurse Practitioner

## 2023-05-14 DIAGNOSIS — Z01810 Encounter for preprocedural cardiovascular examination: Secondary | ICD-10-CM

## 2023-05-14 NOTE — Progress Notes (Signed)
Virtual Visit via Telephone Note   Because of Damon Walker's co-morbid illnesses, he is at least at moderate risk for complications without adequate follow up.  This format is felt to be most appropriate for this patient at this time.  The patient did not have access to video technology/had technical difficulties with video requiring transitioning to audio format only (telephone).  All issues noted in this document were discussed and addressed.  No physical exam could be performed with this format.  Please refer to the patient's chart for his consent to telehealth for [facility].  Evaluation Performed:  Preoperative cardiovascular risk assessment _____________   Date:  05/14/2023   Patient ID:  Damon Walker, DOB 1946/03/25, MRN [id number] Patient Location:  Home Provider location:   Office  Primary Care Provider:  [name], MD Primary Cardiologist:  Bryan Lemma, MD  Chief Complaint / Patient Profile   77 y.o. y/o male with a h/o CAD s/p STEMI-DES x2-LAD in 2016, ICM chronic combined systolic and diastolic heart failure, CVA, PE, hypertension, hyperlipidemia, carotid artery stenosis s/p L TCAR, known R vertebral artery occlusion, CKD and former tobacco use who is pending TURP and prostate biopsy on 05/27/2023 with Dr. Mena Goes of [facility] and presents today for telephonic preoperative cardiovascular risk assessment.  History of Present Illness    Damon Walker is a 77 y.o. male who presents via audio/video conferencing accompanied by his wife (DPR on file) for a telehealth visit today.  Pt was last seen in cardiology clinic on 03/05/2023 by Juanda Crumble, PA.  At that time Damon Walker was doing well.  The patient is now pending procedure as outlined above. Since his last visit, he has been stable from a cardiac standpoint.   He denies chest pain, palpitations, dyspnea, pnd, orthopnea, n, v, dizziness, syncope, edema, weight gain, or early satiety.  All other systems reviewed and are otherwise negative except as noted above.   Past Medical History    Past Medical History:  Diagnosis Date   Carotid arterial disease (HCC) 06/2014; 01/09/2015   Followed by Dr. Arbie Cookey of [facility] -- a) Carotid u/s: 40-59% bilat ICA stenosis; b) 10/2019: Carotid Dopplers October 20, 2019: R ICA 1-39%, LICA 40-59%.  Right vertebral artery appears occluded.  Normal subclavian flow.  Normal left vertebral artery. 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  MRI Brain: Acute L Occipital Lobe Infarct. Chronic microvascular ischemic changes in the white matter and left pons.  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   Chronic combined systolic and diastolic congestive heart failure, NYHA class 2 (HCC) 05/11/2015 - 05/15/15   a. EF 20-25% with anterior-anteroseptal akinesis (immediately post anterior STEMI).  ; b. 10/10/'16 Echo: EF 35-40% with moderate mid-apical anteroseptal, anterior and apical hypokinesis.   Coronary artery disease involving native heart 05/11/2015   Cath: 3V CAD with 90% prox RCA, 80% mid RCA, 80% OM3, (RCA and OM residual treated medically) 100% LAD - PCI to Prox-Mid LAD with Overlapping Promus Premier DES 3.0 x 38 & 3.0 x 16 (post-dilated to 3.5 mm)    Essential hypertension    H/O: GI bleed 05/15/2015   s/p Sigmoid Polypectomy 05/04/2015 - prior to STEMI on May 11, 2015; Had GI Bleed following TPA for CVA, while on ASA &  Brilinta; Flex Sig - 1. Internal Hemorrhoids, 2. Distal sigmoid polypectomy site with flat Whitebase status post biopsy and tattoo with 2mL spot over 3 injections 3. Proximal sigmoid polypectomy site seen as well, 4) otherwise normal with no evidence of bleed   Hyperlipidemia with target LDL less than 70     Ischemic cardiomyopathy 05/11/2015   EF 40% on cath 05/11/2015 after anterior STEMI, EF 20-25% on echo 05/13/2015; 12/2015 =  EF ~45%.   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   Prediabetes Oct 2016   A1C 6.0 in Oct 2016   ST elevation myocardial infarction (STEMI) involving left anterior descending (LAD) coronary artery with complication (HCC) 05/11/2015   100% Prox LAD - PCI with overlapping DES x 2   Tobacco abuse    a. 30 yrs - 1.5 ppd. - Quit 05/11/2015   Type 2 diabetes mellitus with other specified complication (HCC) 10/23/2021   Vision abnormalities    Right hemianopsia   Past Surgical History:  Procedure Laterality Date   CARDIAC CATHETERIZATION N/A 05/11/2015   Procedure: Left Heart Cath and Coronary Angiography;  Surgeon: Peter M Swaziland, MD;  Location[facility] INVASIVE CV LAB;  Service: Cardiovascular;  100% pLAD (long lesion). RCA - prox 90%, mid 80%. OM2 & OM3 80% (OM2 & 3 not necessarily PCI targets); EF 35-45% with Anterio HK   CARDIAC CATHETERIZATION N/A 05/11/2015   Procedure: Coronary Stent Intervention;  Surgeon: Peter M Swaziland, MD;  Location: [facility] INVASIVE CV LAB;  Service: Cardiovascular; PCI to Prox-Mid LAD with Overlapping Promus Premier DES 3.0 x 38 & 3.0 x 16 (post-dilated to 3.5 mm)     FLEXIBLE SIGMOIDOSCOPY N/A 05/18/2015   Procedure: FLEXIBLE SIGMOIDOSCOPY;  Surgeon: Vida Rigger, MD;  Location: [facility] ENDOSCOPY;  Service: Endoscopy;  Laterality: N/A;   NM MYOVIEW LTD  08/2016   EF 30% with large anterior-anteroseptal and apical infarct consistent with LAD infarct. His HIGH RISK because of large infarct. No ischemia.   S/P Appendectomy     Age 77   S/P Inguinal Hernia Repair     In his 77's.   TRANSCAROTID ARTERY REVASCULARIZATION  Left 04/30/2022   Procedure: Transcarotid Artery Revascularization;  Surgeon: Chuck Hint, MD;  Location: [facility] OR;  Service: Vascular;  Laterality: Left;    TRANSTHORACIC ECHOCARDIOGRAM  05/15/2015   Mild LVH, EF 35-40% - Mod HK of mid-apical anteroseptal, anterior & apical walls.  Gr 1 DD. Mild bilateral Atrial Enlargement.   TRANSTHORACIC ECHOCARDIOGRAM  12/2015   EF up to ~40-45% wiht Anterior-Anteroapical HK.  Mod LV dilation with increased LVEDP.   TRANSTHORACIC ECHOCARDIOGRAM  12/'17; 4/'18   a) EF ~30-35% (in setting of HTN Urgency). - Gr 2 DD;; b) EF 30-35% (per Dr. Royann Shivers - 35-40%). Anterior-anteroapical Akinesis. Gr2 DD w/ high filling pressures. Mild LA dilation. Mild RV dilation.   ULTRASOUND GUIDANCE FOR VASCULAR ACCESS Right 04/30/2022   Procedure: ULTRASOUND GUIDANCE FOR VASCULAR ACCESS;  Surgeon: Chuck Hint, MD;  Location: [facility] OR;  Service: Vascular;  Laterality: Right;    Allergies  Allergies  Allergen Reactions   Latex Hives, Itching, Rash and Other (See Comments)    Blisters    Home Medications    Prior to Admission medications   Medication Sig Start Date End Date Taking? Authorizing Provider  acetaminophen (TYLENOL) 325 MG tablet Take 1-2 tablets (325-650 mg total) by mouth every 4 (four) hours as needed for mild pain.  05/07/22   Love, Evlyn Kanner, PA-C  aspirin EC 81 MG tablet Take 1 tablet (81 mg total) by mouth daily. 05/07/22 06/01/23  Love, Evlyn Kanner, PA-C  carvedilol (COREG) 3.125 MG tablet Take 1 tablet (3.125 mg total) by mouth 2 (two) times daily. 09/02/22   Marykay Lex, MD  cephALEXin (KEFLEX) 500 MG capsule Take 1 capsule (500 mg total) by mouth 4 (four) times daily. 05/03/23   Palumbo, April, MD  clopidogrel (PLAVIX) 75 MG tablet Take 1 tablet (75 mg total) by mouth daily. 05/07/22   Love, Evlyn Kanner, PA-C  famotidine (PEPCID) 40 MG tablet Take 1 tablet (40 mg total) by mouth every other day. 05/07/22   Love, Evlyn Kanner, PA-C  ferrous sulfate 325 (65 FE) MG tablet Take 1 tablet (325 mg total) by mouth daily with breakfast. 05/07/22   Love, Evlyn Kanner, PA-C  finasteride (PROSCAR) 5 MG tablet Take 5 mg by mouth  at bedtime. 03/20/23   [provider]  furosemide (LASIX) 20 MG tablet TAKE 1 TABLET BY MOUTH ONCE DAILY AS NEEDED 02/25/23   Marykay Lex, MD  losartan (COZAAR) 100 MG tablet Take 1 tablet (100 mg total) by mouth daily. 09/02/22   Marykay Lex, MD  nitroGLYCERIN (NITROSTAT) 0.4 MG SL tablet Place 1 tablet (0.4 mg total) under the tongue every 5 (five) minutes x 3 doses as needed for chest pain. 03/05/23   Cannon Kettle, PA-C  pantoprazole (PROTONIX) 20 MG tablet Take 1 tablet (20 mg total) by mouth every other day. 05/07/22   Love, Evlyn Kanner, PA-C  rosuvastatin (CRESTOR) 40 MG tablet Take 1 tablet (40 mg total) by mouth daily at 6 PM. 05/07/22   Love, Evlyn Kanner, PA-C  sertraline (ZOLOFT) 25 MG tablet Take 0.5 tablets (12.5 mg total) by mouth at bedtime. 05/07/22   Love, Evlyn Kanner, PA-C  tamsulosin (FLOMAX) 0.4 MG CAPS capsule Take 0.4 mg by mouth at bedtime. 03/20/23   [provider]  tamsulosin (FLOMAX) 0.4 MG CAPS capsule Take 1 capsule (0.4 mg total) by mouth daily. 05/03/23   Palumbo, April, MD  traZODone (DESYREL) 50 MG tablet Take 1 tablet (50 mg total) by mouth at bedtime. 05/07/22   Jacquelynn Cree, PA-C    Physical Exam    Vital Signs:  Damon Walker does not have vital signs available for review today.  Given telephonic nature of communication, physical exam is limited. AAOx3. NAD. Normal affect.  Speech and respirations are unlabored.  Accessory Clinical Findings    None  Assessment & Plan    1.  Preoperative Cardiovascular Risk Assessment: According to the Revised Cardiac Risk Index (RCRI), his Perioperative Risk of Major Cardiac Event is (%): 11. His Functional Capacity in METs is: 5.72 according to the [employer] Activity Status Index (DASI).  Patient is high risk for surgical procedures given history of CAD, heart failure, CVA, and diabetes.  However, he has since stable with no new symptoms.Therefore, based on ACC/AHA guidelines, patient would be at  acceptable risk for the planned procedure without further cardiovascular testing.   The patient and pt's wife were advised that if he develops new symptoms prior to surgery to contact our office to arrange for a follow-up visit, and he verbalized understanding.  From a cardiac standpoint, he may hold Plavix for 5 days prior to procedure. Pt is on DAPT post TCAR in 04/2022, therefore, additional recommendations for hold Aspirin and Plavix prior to surgery should come from managing provider (Vascular  surgery).   A copy of this note will be routed to requesting surgeon.  Time:   Today, I have spent 10 minutes with the patient with telehealth technology discussing medical history, symptoms, and management plan.     Joylene Grapes, NP  05/14/2023, 10:00 AM

## 2023-05-15 ENCOUNTER — Telehealth: Payer: Self-pay

## 2023-05-15 NOTE — Telephone Encounter (Signed)
Pt's wife called to see if we received anything from pt's urology regarding a seven day Aspirin hold for an upcoming procedure on 05/27/23. We have not at this time received the request. She was going to have them fax/call us with this request.

## 2023-05-16 NOTE — Progress Notes (Addendum)
COVID Vaccine Completed: yes  Date of COVID positive in last 90 days: no  PCP - [name], MD Cardiologist - Bryan Lemma, MD  Cardiac clearance by Bernadene Person, NP 05/14/23 in Epic  Chest x-ray - 03/24/23 Epic EKG - 03/27/23 Epic Stress Test - 2015 ECHO - 04/22/22 Epic Cardiac Cath - 2016 Pacemaker/ICD device last checked:n/a Spinal Cord Stimulator: n/a  Bowel Prep - clear liquids day before  Sleep Study -  CPAP -   Fasting Blood Sugar - pre, no meds or checks at home Checks Blood Sugar _____ times a day  Last dose of GLP1 agonist-  N/A GLP1 instructions:  N/A   Last dose of SGLT-2 inhibitors-  N/A SGLT-2 instructions: N/A   Blood Thinner Instructions: Plavix, hold 5 days Aspirin Instructions: ASA 8, hold 5 days Last Dose: 05/21/23 0500  Activity level: Can go up a flight of stairs and perform activities of daily living without stopping and without symptoms of chest pain or shortness of breath. Slow with stairs due to hip   Anesthesia review: HTN, STEMI, CAD, CVA, cardiomyopathy, DM2 ,CKD, cirrhosis   Patient denies shortness of breath, fever, cough and chest pain at PAT appointment  Patient verbalized understanding of instructions that were given to them at the PAT appointment. Patient was also instructed that they will need to review over the PAT instructions again at home before surgery.

## 2023-05-16 NOTE — Patient Instructions (Addendum)
SURGICAL WAITING ROOM VISITATION  Patients having surgery or a procedure may have no more than 2 support people in the waiting area - these visitors may rotate.    Children under the age of 60 must have an adult with them who is not the patient.  Due to an increase in RSV and influenza rates and associated hospitalizations, children ages 19 and under may not visit patients in [facility] hospitals.  If the patient needs to stay at the hospital during part of their recovery, the visitor guidelines for inpatient rooms apply. Pre-op nurse will coordinate an appropriate time for 1 support person to accompany patient in pre-op.  This support person may not rotate.    Please refer to the [facility] website for the visitor guidelines for Inpatients (after your surgery is over and you are in a regular room).    Your procedure is scheduled on: 05/27/23   Report to [facility] Main Entrance    Report to admitting at 9:00 AM   Call this number if you have problems the morning of surgery [phone]   Follow a clear liquid diet the day before surgery.  Water Non-Citrus Juices (without pulp, NO RED-Apple, White grape, White cranberry) Black Coffee (NO MILK/CREAM OR CREAMERS, sugar ok)  Clear Tea (NO MILK/CREAM OR CREAMERS, sugar ok) regular and decaf                             Plain Jell-O (NO RED)                                           Fruit ices (not with fruit pulp, NO RED)                                     Popsicles (NO RED)                                                               Sports drinks like Gatorade (NO RED)              Nothing to drink after midnight.          If you have questions, please contact your surgeon's office.   FOLLOW BOWEL PREP AND ANY ADDITIONAL PRE OP INSTRUCTIONS YOU RECEIVED FROM YOUR SURGEON'S OFFICE!!!     Oral Hygiene is also important to reduce your risk of infection.                                    Remember - BRUSH YOUR TEETH THE  MORNING OF SURGERY WITH YOUR REGULAR TOOTHPASTE  DENTURES WILL BE REMOVED PRIOR TO SURGERY PLEASE DO NOT APPLY "Poly grip" OR ADHESIVES!!!   Stop all vitamins and herbal supplements 7 days before surgery.   Take these medicines the morning of surgery with A SIP OF WATER: Tylenol, Carvedilol, Famotidine, Pantoprazole, Tamsulosin  You may not have any metal on your body including jewelry, and body piercing             Do not wear lotions, powders, cologne, or deodorant              Men may shave face and neck.   Do not bring valuables to the hospital. [facility] IS NOT             RESPONSIBLE   FOR VALUABLES.   Contacts, glasses, dentures or bridgework may not be worn into surgery.   Bring small overnight bag day of surgery.   DO NOT BRING YOUR HOME MEDICATIONS TO THE HOSPITAL. PHARMACY WILL DISPENSE MEDICATIONS LISTED ON YOUR MEDICATION LIST TO YOU DURING YOUR ADMISSION IN THE HOSPITAL!              Please read over the following fact sheets you were given: IF YOU HAVE QUESTIONS ABOUT YOUR PRE-OP INSTRUCTIONS PLEASE CALL [phone] Contras   If you received a COVID test during your pre-op visit  it is requested that you wear a mask when out in public, stay away from anyone that may not be feeling well and notify your surgeon if you develop symptoms. If you test positive for Covid or have been in contact with anyone that has tested positive in the last 10 days please notify you surgeon.    [facility] - Preparing for Surgery Before surgery, you can play an important role.  Because skin is not sterile, your skin needs to be as free of germs as possible.  You can reduce the number of germs on your skin by washing with CHG (chlorahexidine gluconate) soap before surgery.  CHG is an antiseptic cleaner which kills germs and bonds with the skin to continue killing germs even after washing. Please DO NOT use if you have an allergy to CHG or antibacterial soaps.  If  your skin becomes reddened/irritated stop using the CHG and inform your nurse when you arrive at Short Stay. Do not shave (including legs and underarms) for at least 48 hours prior to the first CHG shower.  You may shave your face/neck.  Please follow these instructions carefully:  1.  Shower with CHG Soap the night before surgery and the  morning of surgery.  2.  If you choose to wash your hair, wash your hair first as usual with your normal  shampoo.  3.  After you shampoo, rinse your hair and body thoroughly to remove the shampoo.                             4.  Use CHG as you would any other liquid soap.  You can apply chg directly to the skin and wash.  Gently with a scrungie or clean washcloth.  5.  Apply the CHG Soap to your body ONLY FROM THE NECK DOWN.   Do   not use on face/ open                           Wound or open sores. Avoid contact with eyes, ears mouth and   genitals (private parts).                       Wash face,  Genitals (private parts) with your normal soap.  6.  Wash thoroughly, paying special attention to the area where your    surgery  will be performed.  7.  Thoroughly rinse your body with warm water from the neck down.  8.  DO NOT shower/wash with your normal soap after using and rinsing off the CHG Soap.                9.  Pat yourself dry with a clean towel.            10.  Wear clean pajamas.            11.  Place clean sheets on your bed the night of your first shower and do not  sleep with pets. Day of Surgery : Do not apply any lotions/deodorants the morning of surgery.  Please wear clean clothes to the hospital/surgery center.  FAILURE TO FOLLOW THESE INSTRUCTIONS MAY RESULT IN THE CANCELLATION OF YOUR SURGERY  PATIENT SIGNATURE_________________________________  NURSE SIGNATURE__________________________________  ________________________________________________________________________

## 2023-05-19 ENCOUNTER — Encounter (HOSPITAL_COMMUNITY): Payer: Self-pay

## 2023-05-19 ENCOUNTER — Encounter (HOSPITAL_COMMUNITY)
Admission: RE | Admit: 2023-05-19 | Discharge: 2023-05-19 | Disposition: A | Discharge status: Home / Self Care | Payer: Medicare Other | Source: Ambulatory Visit | Attending: Urology | Admitting: Urology

## 2023-05-19 ENCOUNTER — Other Ambulatory Visit: Payer: Self-pay

## 2023-05-19 VITALS — BP 105/52 | HR 74 | Temp 97.9°F | Resp 14 | Ht 67.0 in | Wt 175.0 lb

## 2023-05-19 DIAGNOSIS — K769 Liver disease, unspecified: Secondary | ICD-10-CM

## 2023-05-19 DIAGNOSIS — Z01812 Encounter for preprocedural laboratory examination: Secondary | ICD-10-CM | POA: Insufficient documentation

## 2023-05-19 DIAGNOSIS — E1169 Type 2 diabetes mellitus with other specified complication: Secondary | ICD-10-CM

## 2023-05-19 HISTORY — DX: Gastro-esophageal reflux disease without esophagitis: K21.9

## 2023-05-19 LAB — COMPREHENSIVE METABOLIC PANEL
ALT: 25 U/L (ref 0–44)
AST: 20 U/L (ref 15–41)
Albumin: 3.7 g/dL (ref 3.5–5.0)
Alkaline Phosphatase: 44 U/L (ref 38–126)
Anion gap: 8 (ref 5–15)
BUN: 27 mg/dL — ABNORMAL HIGH (ref 8–23)
CO2: 25 mmol/L (ref 22–32)
Calcium: 9.1 mg/dL (ref 8.9–10.3)
Chloride: 107 mmol/L (ref 98–111)
Creatinine, Ser: 1.31 mg/dL — ABNORMAL HIGH (ref 0.61–1.24)
GFR, Estimated: 55 mL/min — ABNORMAL LOW (ref >60)
Glucose, Bld: 112 mg/dL — ABNORMAL HIGH (ref 70–99)
Potassium: 4.3 mmol/L (ref 3.5–5.1)
Sodium: 140 mmol/L (ref 135–145)
Total Bilirubin: 0.8 mg/dL (ref 0.3–1.2)
Total Protein: 7.2 g/dL (ref 6.5–8.1)

## 2023-05-19 LAB — CBC
HCT: 37.4 % — ABNORMAL LOW (ref 39.0–52.0)
Hemoglobin: 11.8 g/dL — ABNORMAL LOW (ref 13.0–17.0)
MCH: 30.7 pg (ref 26.0–34.0)
MCHC: 31.6 g/dL (ref 30.0–36.0)
MCV: 97.4 fL (ref 80.0–100.0)
Platelets: 222 10*3/uL (ref 150–400)
RBC: 3.84 MIL/uL — ABNORMAL LOW (ref 4.22–5.81)
RDW: 14 % (ref 11.5–15.5)
WBC: 8.9 10*3/uL (ref 4.0–10.5)
nRBC: 0 % (ref 0.0–0.2)

## 2023-05-20 NOTE — Progress Notes (Signed)
Case: [id number] Date/Time: 05/27/23 1100   Procedures:      TRANSURETHRAL RESECTION OF THE PROSTATE (TURP)     BIOPSY TRANSRECTAL ULTRASONIC PROSTATE (TUBP)   Anesthesia type: General   Pre-op diagnosis: BENIGN PROSTATE HYPERPLASIA RETENTION AND PROSTATE SPECIFIC ANTIGEN ELEVATION   Location: [facility] PROCEDURE [facility] / [facility]   Surgeons: Jerilee Field, MD       DISCUSSION: Damon Walker is a 77 yo male who presents to PAT prior to surgery above. PMH of former smoking (quit 2016), CAD s/p PCI to LAD (19/2016), hx of STEMI (05/2015), combined systolic/diastolic CHF, ischemic CM, HTN, HLD, hx of CVA (05/2015, 04/2022), carotid artery disease s/p TCAR (04/2022), T2DM, GERD, cirrhosis.  Presented to the ED with urinary retention on 03/18/23. Foley was placed. Now scheduled for surgery above. Has had to have several ED visits due to issues with hematuria and UTI since then.   Patient follows with Cardiology for hx of CAD s/p PCI in 2016 and CHF. Had no significant complaints during that visit. Has chronic fatigue. Tolerating DAPT. Does not tolerate Entresto due to hypotension but stable on Coreg, Losartan, and Lasix. Cardiac clearance provided on 10/9:  "Preoperative Cardiovascular Risk Assessment: According to the Revised Cardiac Risk Index (RCRI), his Perioperative Risk of Major Cardiac Event is (%): 11. His Functional Capacity in METs is: 5.72 according to the Duke Activity Status Index (DASI).  Patient is high risk for surgical procedures given history of CAD, heart failure, CVA, and diabetes.  However, he has since stable with no new symptoms.Therefore, based on ACC/AHA guidelines, patient would be at acceptable risk for the planned procedure without further cardiovascular testing.  From a cardiac standpoint, he may hold Plavix for 5 days prior to procedure. Pt is on DAPT post TCAR in 04/2022, therefore, additional recommendations for hold Aspirin and Plavix prior to surgery should come from managing  provider (Vascular surgery)."  Patient has seen Neurology since CVA. Last seen in clinic on 12/11/22. Remaining on DAPT indefinitely was recommended. Also outpatient holter monitor to r/o A.fib.  VS: BP (!) 105/52   Pulse 74   Temp 36.6 C (Oral)   Resp 14   Ht 5\' 7"  (1.702 m)   Wt 79.4 kg   SpO2 99%   BMI 27.41 kg/m   PROVIDERS: [name], MD   LABS: {CHL AN LABS REVIEWED:112001::"Labs reviewed: Acceptable for surgery."} (all labs ordered are listed, but only abnormal results are displayed)  Labs Reviewed  COMPREHENSIVE METABOLIC PANEL - Abnormal; Notable for the following components:      Result Value   Glucose, Bld 112 (*)    BUN 27 (*)    Creatinine, Ser 1.31 (*)    GFR, Estimated 55 (*)    All other components within normal limits  CBC - Abnormal; Notable for the following components:   RBC 3.84 (*)    Hemoglobin 11.8 (*)    HCT 37.4 (*)    All other components within normal limits     IMAGES:   EKG:   CV:  Past Medical History:  Diagnosis Date   Carotid arterial disease (HCC) 06/2014; 01/09/2015   Followed by Dr. Arbie Cookey of [facility] -- a) Carotid u/s: 40-59% bilat ICA stenosis; b) 10/2019: Carotid Dopplers October 20, 2019: R ICA 1-39%, LICA 40-59%.  Right vertebral artery appears occluded.  Normal subclavian flow.  Normal left vertebral artery. 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  MRI Brain: Acute L Occipital Lobe Infarct. Chronic microvascular ischemic changes in the white matter and left pons.  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   Chronic combined systolic and diastolic congestive heart failure, NYHA class 2 (HCC) 05/11/2015 - 05/15/15   a. EF 20-25% with anterior-anteroseptal akinesis (immediately post anterior  STEMI).  ; b. 10/10/'16 Echo: EF 35-40% with moderate mid-apical anteroseptal, anterior and apical hypokinesis.   Coronary artery disease involving native heart 05/11/2015   Cath: 3V CAD with 90% prox RCA, 80% mid RCA, 80% OM3, (RCA and OM residual treated medically) 100% LAD - PCI to Prox-Mid LAD with Overlapping Promus Premier DES 3.0 x 38 & 3.0 x 16 (post-dilated to 3.5 mm)    Essential hypertension    GERD (gastroesophageal reflux disease)    H/O: GI bleed 05/15/2015   s/p Sigmoid Polypectomy 05/04/2015 - prior to STEMI on May 11, 2015; Had GI Bleed following TPA for CVA, while on ASA & Brilinta; Flex Sig - 1. Internal Hemorrhoids, 2. Distal sigmoid polypectomy site with flat Whitebase status post biopsy and tattoo with 2mL spot over 3 injections 3. Proximal sigmoid polypectomy site seen as well, 4) otherwise normal with no evidence of bleed   Hyperlipidemia with target LDL less than 70    Ischemic cardiomyopathy 05/11/2015   EF 40% on cath 05/11/2015 after anterior STEMI, EF 20-25% on echo 05/13/2015; 12/2015 =  EF ~45%.   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   Prediabetes 05/2015   A1C 6.0 in Oct 2016   ST elevation myocardial infarction (STEMI) involving left anterior descending (LAD) coronary artery with complication (HCC) 05/11/2015   100% Prox LAD - PCI with overlapping DES x 2   Stroke (HCC)    Tobacco abuse    a. 30 yrs - 1.5 ppd. - Quit 05/11/2015   Type 2 diabetes mellitus with other specified complication (HCC) 10/23/2021   Vision abnormalities    Right hemianopsia    Past Surgical History:  Procedure Laterality Date   CARDIAC CATHETERIZATION N/A 05/11/2015   Procedure: Left Heart Cath and Coronary Angiography;  Surgeon: Peter M Swaziland, MD;  Location[facility] INVASIVE CV LAB;  Service: Cardiovascular;  100% pLAD (long lesion). RCA - prox 90%, mid 80%. OM2 & OM3 80% (OM2 & 3 not necessarily PCI targets); EF  35-45% with Anterio HK   CARDIAC CATHETERIZATION N/A 05/11/2015   Procedure: Coronary Stent Intervention;  Surgeon: Peter M Swaziland, MD;  Location: [facility] INVASIVE CV LAB;  Service: Cardiovascular; PCI to Prox-Mid LAD with Overlapping Promus Premier DES 3.0 x 38 & 3.0 x 16 (post-dilated to 3.5 mm)     FLEXIBLE SIGMOIDOSCOPY N/A 05/18/2015   Procedure: FLEXIBLE SIGMOIDOSCOPY;  Surgeon: Vida Rigger, MD;  Location: [facility] ENDOSCOPY;  Service: Endoscopy;  Laterality: N/A;   NM MYOVIEW LTD  08/2016   EF 30% with large anterior-anteroseptal and apical infarct consistent with LAD infarct. His HIGH RISK because of large infarct. No ischemia.   S/P Appendectomy     Age 56   S/P Inguinal Hernia Repair     In his 20's.   TRANSCAROTID ARTERY REVASCULARIZATION  Left 04/30/2022   Procedure: Transcarotid Artery Revascularization;  Surgeon: Chuck Hint, MD;  Location: [facility] OR;  Service: Vascular;  Laterality: Left;  TRANSTHORACIC ECHOCARDIOGRAM  05/15/2015   Mild LVH, EF 35-40% - Mod HK of mid-apical anteroseptal, anterior & apical walls.  Gr 1 DD. Mild bilateral Atrial Enlargement.   TRANSTHORACIC ECHOCARDIOGRAM  12/2015   EF up to ~40-45% wiht Anterior-Anteroapical HK.  Mod LV dilation with increased LVEDP.   TRANSTHORACIC ECHOCARDIOGRAM  12/'17; 4/'18   a) EF ~30-35% (in setting of HTN Urgency). - Gr 2 DD;; b) EF 30-35% (per Dr. Royann Shivers - 35-40%). Anterior-anteroapical Akinesis. Gr2 DD w/ high filling pressures. Mild LA dilation. Mild RV dilation.   ULTRASOUND GUIDANCE FOR VASCULAR ACCESS Right 04/30/2022   Procedure: ULTRASOUND GUIDANCE FOR VASCULAR ACCESS;  Surgeon: Chuck Hint, MD;  Location: [facility] OR;  Service: Vascular;  Laterality: Right;    MEDICATIONS:  acetaminophen (TYLENOL) 325 MG tablet   aspirin EC 81 MG tablet   carvedilol (COREG) 3.125 MG tablet   cephALEXin (KEFLEX) 500 MG capsule   clopidogrel (PLAVIX) 75 MG tablet   famotidine (PEPCID) 40 MG tablet   ferrous sulfate 325 (65  FE) MG tablet   finasteride (PROSCAR) 5 MG tablet   furosemide (LASIX) 20 MG tablet   losartan (COZAAR) 100 MG tablet   nitroGLYCERIN (NITROSTAT) 0.4 MG SL tablet   pantoprazole (PROTONIX) 20 MG tablet   rosuvastatin (CRESTOR) 40 MG tablet   sertraline (ZOLOFT) 25 MG tablet   tamsulosin (FLOMAX) 0.4 MG CAPS capsule   traZODone (DESYREL) 50 MG tablet   No current facility-administered medications for this encounter.

## 2023-05-27 ENCOUNTER — Ambulatory Visit (HOSPITAL_COMMUNITY)
Admission: RE | Admit: 2023-05-27 | Discharge: 2023-05-27 | Disposition: A | Discharge status: Home / Self Care | Payer: Medicare Other | Source: Ambulatory Visit | Attending: Urology | Admitting: Urology

## 2023-05-27 ENCOUNTER — Other Ambulatory Visit (HOSPITAL_COMMUNITY): Payer: Self-pay

## 2023-05-27 ENCOUNTER — Ambulatory Visit (HOSPITAL_BASED_OUTPATIENT_CLINIC_OR_DEPARTMENT_OTHER): Payer: Medicare Other | Admitting: Certified Registered"

## 2023-05-27 ENCOUNTER — Encounter (HOSPITAL_COMMUNITY)
Admission: RE | Disposition: A | Discharge status: Home / Self Care | Payer: Self-pay | Source: Ambulatory Visit | Attending: Urology

## 2023-05-27 ENCOUNTER — Encounter (HOSPITAL_COMMUNITY): Payer: Self-pay | Admitting: Urology

## 2023-05-27 ENCOUNTER — Ambulatory Visit (HOSPITAL_COMMUNITY): Payer: Medicare Other | Admitting: Medical

## 2023-05-27 DIAGNOSIS — Z955 Presence of coronary angioplasty implant and graft: Secondary | ICD-10-CM | POA: Insufficient documentation

## 2023-05-27 DIAGNOSIS — R972 Elevated prostate specific antigen [PSA]: Secondary | ICD-10-CM | POA: Insufficient documentation

## 2023-05-27 DIAGNOSIS — R7303 Prediabetes: Secondary | ICD-10-CM | POA: Insufficient documentation

## 2023-05-27 DIAGNOSIS — K7460 Unspecified cirrhosis of liver: Secondary | ICD-10-CM | POA: Diagnosis not present

## 2023-05-27 DIAGNOSIS — E119 Type 2 diabetes mellitus without complications: Secondary | ICD-10-CM | POA: Insufficient documentation

## 2023-05-27 DIAGNOSIS — N403 Nodular prostate with lower urinary tract symptoms: Secondary | ICD-10-CM | POA: Insufficient documentation

## 2023-05-27 DIAGNOSIS — I110 Hypertensive heart disease with heart failure: Secondary | ICD-10-CM | POA: Insufficient documentation

## 2023-05-27 DIAGNOSIS — I69922 Dysarthria following unspecified cerebrovascular disease: Secondary | ICD-10-CM | POA: Insufficient documentation

## 2023-05-27 DIAGNOSIS — C61 Malignant neoplasm of prostate: Secondary | ICD-10-CM | POA: Insufficient documentation

## 2023-05-27 DIAGNOSIS — Z87891 Personal history of nicotine dependence: Secondary | ICD-10-CM | POA: Insufficient documentation

## 2023-05-27 DIAGNOSIS — I5042 Chronic combined systolic (congestive) and diastolic (congestive) heart failure: Secondary | ICD-10-CM | POA: Insufficient documentation

## 2023-05-27 DIAGNOSIS — R338 Other retention of urine: Secondary | ICD-10-CM | POA: Insufficient documentation

## 2023-05-27 DIAGNOSIS — R590 Localized enlarged lymph nodes: Secondary | ICD-10-CM | POA: Diagnosis not present

## 2023-05-27 DIAGNOSIS — R339 Retention of urine, unspecified: Secondary | ICD-10-CM

## 2023-05-27 DIAGNOSIS — N401 Enlarged prostate with lower urinary tract symptoms: Secondary | ICD-10-CM | POA: Insufficient documentation

## 2023-05-27 DIAGNOSIS — Z79899 Other long term (current) drug therapy: Secondary | ICD-10-CM | POA: Insufficient documentation

## 2023-05-27 DIAGNOSIS — K219 Gastro-esophageal reflux disease without esophagitis: Secondary | ICD-10-CM | POA: Insufficient documentation

## 2023-05-27 DIAGNOSIS — I252 Old myocardial infarction: Secondary | ICD-10-CM | POA: Insufficient documentation

## 2023-05-27 DIAGNOSIS — N289 Disorder of kidney and ureter, unspecified: Secondary | ICD-10-CM | POA: Diagnosis not present

## 2023-05-27 DIAGNOSIS — I2510 Atherosclerotic heart disease of native coronary artery without angina pectoris: Secondary | ICD-10-CM

## 2023-05-27 DIAGNOSIS — N402 Nodular prostate without lower urinary tract symptoms: Secondary | ICD-10-CM | POA: Diagnosis not present

## 2023-05-27 DIAGNOSIS — N138 Other obstructive and reflux uropathy: Secondary | ICD-10-CM

## 2023-05-27 DIAGNOSIS — Z8673 Personal history of transient ischemic attack (TIA), and cerebral infarction without residual deficits: Secondary | ICD-10-CM | POA: Insufficient documentation

## 2023-05-27 DIAGNOSIS — Z7902 Long term (current) use of antithrombotics/antiplatelets: Secondary | ICD-10-CM | POA: Insufficient documentation

## 2023-05-27 HISTORY — PX: PROSTATE BIOPSY: SHX241

## 2023-05-27 HISTORY — PX: TRANSURETHRAL RESECTION OF PROSTATE: SHX73

## 2023-05-27 SURGERY — TURP (TRANSURETHRAL RESECTION OF PROSTATE)
Anesthesia: General

## 2023-05-27 MED ORDER — ORAL CARE MOUTH RINSE: 15.0000 mL | Freq: Once | OROMUCOSAL | Status: AC

## 2023-05-27 MED ORDER — HYDROMORPHONE HCL 1 MG/ML IJ SOLN
INTRAMUSCULAR | Status: AC
  Administered 2023-05-27: 0.5 mg via INTRAVENOUS
  Filled 2023-05-27: qty 1

## 2023-05-27 MED ORDER — CLOPIDOGREL BISULFATE 75 MG PO TABS: 75.0000 mg | ORAL_TABLET | Freq: Every day | ORAL | Status: AC

## 2023-05-27 MED ORDER — EPHEDRINE 5 MG/ML INJ
INTRAVENOUS | Status: AC
  Filled 2023-05-27: qty 5

## 2023-05-27 MED ORDER — PHENYLEPHRINE HCL (PRESSORS) 10 MG/ML IV SOLN
INTRAVENOUS | Status: DC | PRN
  Administered 2023-05-27 (×6): 80 ug via INTRAVENOUS

## 2023-05-27 MED ORDER — MIDAZOLAM HCL 2 MG/2ML IJ SOLN: 0.5000 mg | Freq: Once | INTRAMUSCULAR | Status: DC | PRN

## 2023-05-27 MED ORDER — PHENYLEPHRINE 80 MCG/ML (10ML) SYRINGE FOR IV PUSH (FOR BLOOD PRESSURE SUPPORT)
PREFILLED_SYRINGE | INTRAVENOUS | Status: AC
  Filled 2023-05-27: qty 10

## 2023-05-27 MED ORDER — CHLORHEXIDINE GLUCONATE 0.12 % MT SOLN
15.0000 mL | Freq: Once | OROMUCOSAL | Status: AC
Start: 2023-05-27 — End: 2023-05-27
  Administered 2023-05-27: 15 mL via OROMUCOSAL

## 2023-05-27 MED ORDER — OXYCODONE HCL 5 MG/5ML PO SOLN: 5.0000 mg | Freq: Once | ORAL | Status: DC | PRN

## 2023-05-27 MED ORDER — FENTANYL CITRATE (PF) 100 MCG/2ML IJ SOLN
INTRAMUSCULAR | Status: DC | PRN
  Administered 2023-05-27: 50 ug via INTRAVENOUS

## 2023-05-27 MED ORDER — LIDOCAINE HCL (CARDIAC) PF 100 MG/5ML IV SOSY
PREFILLED_SYRINGE | INTRAVENOUS | Status: DC | PRN
  Administered 2023-05-27 (×2): 40 mg via INTRAVENOUS
  Administered 2023-05-27: 10 mg via INTRAVENOUS

## 2023-05-27 MED ORDER — LIDOCAINE HCL (PF) 2 % IJ SOLN
INTRAMUSCULAR | Status: AC
  Filled 2023-05-27: qty 5

## 2023-05-27 MED ORDER — DEXAMETHASONE SODIUM PHOSPHATE 10 MG/ML IJ SOLN
INTRAMUSCULAR | Status: AC
Start: 2023-05-27 — End: ?
  Filled 2023-05-27: qty 1

## 2023-05-27 MED ORDER — OXYCODONE HCL 5 MG PO TABS: 5.0000 mg | ORAL_TABLET | Freq: Once | ORAL | Status: DC | PRN

## 2023-05-27 MED ORDER — FENTANYL CITRATE (PF) 100 MCG/2ML IJ SOLN
INTRAMUSCULAR | Status: AC
  Filled 2023-05-27: qty 2

## 2023-05-27 MED ORDER — SODIUM CHLORIDE 0.9 % IR SOLN
Status: DC | PRN
  Administered 2023-05-27: 12000 mL via INTRAVESICAL

## 2023-05-27 MED ORDER — ONDANSETRON HCL 4 MG/2ML IJ SOLN
INTRAMUSCULAR | Status: DC | PRN
  Administered 2023-05-27: 4 mg via INTRAVENOUS

## 2023-05-27 MED ORDER — STERILE WATER FOR IRRIGATION IR SOLN
Status: DC | PRN
  Administered 2023-05-27: 500 mL

## 2023-05-27 MED ORDER — DEXAMETHASONE SODIUM PHOSPHATE 10 MG/ML IJ SOLN
INTRAMUSCULAR | Status: DC | PRN
  Administered 2023-05-27: 10 mg via INTRAVENOUS

## 2023-05-27 MED ORDER — HYDROMORPHONE HCL 1 MG/ML IJ SOLN
0.2500 mg | INTRAMUSCULAR | Status: DC | PRN
Start: 2023-05-27 — End: 2023-05-27
  Administered 2023-05-27: 0.5 mg via INTRAVENOUS

## 2023-05-27 MED ORDER — CEPHALEXIN 250 MG PO CAPS
250.0000 mg | ORAL_CAPSULE | Freq: Every evening | ORAL | 0 refills | Status: DC
  Filled 2023-05-27: qty 14, 14d supply, fill #0

## 2023-05-27 MED ORDER — ONDANSETRON HCL 4 MG/2ML IJ SOLN
INTRAMUSCULAR | Status: AC
  Filled 2023-05-27: qty 2

## 2023-05-27 MED ORDER — SODIUM CHLORIDE 0.9 % IV SOLN
2.0000 g | INTRAVENOUS | Status: AC
  Administered 2023-05-27: 2 g via INTRAVENOUS
  Filled 2023-05-27: qty 20

## 2023-05-27 MED ORDER — ACETAMINOPHEN 500 MG PO TABS
1000.0000 mg | ORAL_TABLET | Freq: Once | ORAL | Status: AC
  Administered 2023-05-27: 1000 mg via ORAL
  Filled 2023-05-27: qty 2

## 2023-05-27 MED ORDER — LACTATED RINGERS IV SOLN: INTRAVENOUS | Status: DC

## 2023-05-27 MED ORDER — SUCCINYLCHOLINE CHLORIDE 200 MG/10ML IV SOSY
PREFILLED_SYRINGE | INTRAVENOUS | Status: DC | PRN
Start: 2023-05-27 — End: 2023-05-27
  Administered 2023-05-27: 120 mg via INTRAVENOUS

## 2023-05-27 MED ORDER — PROPOFOL 10 MG/ML IV BOLUS
INTRAVENOUS | Status: DC | PRN
  Administered 2023-05-27: 110 mg via INTRAVENOUS

## 2023-05-27 SURGICAL SUPPLY — 22 items
BAG DRN RND TRDRP ANRFLXCHMBR (UROLOGICAL SUPPLIES) ×1
BAG URINE DRAIN 2000ML AR STRL (UROLOGICAL SUPPLIES) ×2 IMPLANT
BAG URO CATCHER STRL LF (MISCELLANEOUS) ×2 IMPLANT
CATH SILICONE 22FR 30CC 3WAY (CATHETERS) IMPLANT
DRAPE FOOT SWITCH (DRAPES) ×2 IMPLANT
EVACUATOR MICROVAS BLADDER (UROLOGICAL SUPPLIES) ×2 IMPLANT
GLOVE SURG LX STRL 7.5 STRW (GLOVE) ×2 IMPLANT
GOWN STRL REUS W/ TWL XL LVL3 (GOWN DISPOSABLE) ×2 IMPLANT
GOWN STRL REUS W/TWL XL LVL3 (GOWN DISPOSABLE) ×1
HOLDER FOLEY CATH W/STRAP (MISCELLANEOUS) IMPLANT
INSTR BIOPSY MAXCORE 18GX20 (NEEDLE) IMPLANT
KIT TURNOVER KIT A (KITS) IMPLANT
LOOP CUT BIPOLAR 24F LRG (ELECTROSURGICAL) IMPLANT
MANIFOLD NEPTUNE II (INSTRUMENTS) ×2 IMPLANT
PACK CYSTO (CUSTOM PROCEDURE TRAY) ×2 IMPLANT
PAD PREP 24X48 CUFFED NSTRL (MISCELLANEOUS) ×2 IMPLANT
SYR 30ML LL (SYRINGE) IMPLANT
SYR TOOMEY IRRIG 70ML (MISCELLANEOUS) ×1
SYRINGE TOOMEY IRRIG 70ML (MISCELLANEOUS) ×2 IMPLANT
TUBING CONNECTING 10 (TUBING) ×2 IMPLANT
TUBING UROLOGY SET (TUBING) ×2 IMPLANT
UNDERPAD 30X36 HEAVY ABSORB (UNDERPADS AND DIAPERS) ×2 IMPLANT

## 2023-05-27 NOTE — Anesthesia Procedure Notes (Signed)
Procedure Name: Intubation Date/Time: 05/27/2023 11:15 AM  Performed by: Donna Bernard, CRNAPre-anesthesia Checklist: Patient identified, Emergency Drugs available, Suction available, Patient being monitored and Timeout performed Patient Re-evaluated:Patient Re-evaluated prior to induction Oxygen Delivery Method: Circle system utilized Preoxygenation: Pre-oxygenation with 100% oxygen Induction Type: IV induction Ventilation: Mask ventilation without difficulty Laryngoscope Size: Miller and 2 Grade View: Grade I Tube type: Oral Tube size: 7.0 mm Number of attempts: 1 Airway Equipment and Method: Stylet Placement Confirmation: positive ETCO2, ETT inserted through vocal cords under direct vision, CO2 detector and breath sounds checked- equal and bilateral Secured at: 25 cm Tube secured with: Tape Dental Injury: Teeth and Oropharynx as per pre-operative assessment

## 2023-05-27 NOTE — Anesthesia Preprocedure Evaluation (Addendum)
Anesthesia Evaluation  Patient identified by MRN, date of birth, ID band Patient awake    Reviewed: Allergy & Precautions, NPO status , Patient's Chart, lab work & pertinent test results, reviewed documented beta blocker date and time   History of Anesthesia Complications Negative for: history of anesthetic complications  Airway Mallampati: I  TM Distance: >3 FB Neck ROM: Full    Dental  (+) Edentulous Upper, Edentulous Lower   Pulmonary former smoker   breath sounds clear to auscultation       Cardiovascular hypertension, Pt. on medications and Pt. on home beta blockers (-) angina + CAD, + Past MI, + Cardiac Stents and +CHF   Rhythm:Regular Rate:Normal  '23 ECHO: EF 30-35%. 1. The LV has moderately decreased function, global hypokinesis. Grade I diastolic  dysfunction (impaired relaxation). There is akinesis of the left ventricular, apical apical segment and anterior wall.   2. RVF is normal. The right ventricular size is normal. There is normal pulmonary artery systolic pressure.   3. The mitral valve is normal in structure. Trivial MR. No evidence of mitral stenosis.   4. The aortic valve is normal in structure. AI is not visualized. No AS is present.     Neuro/Psych    Depression    CVA (slurred speech), Residual Symptoms    GI/Hepatic ,GERD  Controlled and Medicated,,(+) Cirrhosis         Endo/Other  diabetes (borderline: glu 112,)    Renal/GU Renal InsufficiencyRenal disease     Musculoskeletal   Abdominal   Peds  Hematology Hb 11.8, plt 222k   Anesthesia Other Findings   Reproductive/Obstetrics                             Anesthesia Physical Anesthesia Plan  ASA: 4  Anesthesia Plan: General   Post-op Pain Management: Tylenol PO (pre-op)*   Induction: Intravenous  PONV Risk Score and Plan: 2 and Ondansetron and Dexamethasone  Airway Management Planned: Oral  ETT  Additional Equipment: None  Intra-op Plan:   Post-operative Plan: Extubation in OR  Informed Consent: I have reviewed the patients History and Physical, chart, labs and discussed the procedure including the risks, benefits and alternatives for the proposed anesthesia with the patient or authorized representative who has indicated his/her understanding and acceptance.       Plan Discussed with: Surgeon and CRNA  Anesthesia Plan Comments:         Anesthesia Quick Evaluation

## 2023-05-27 NOTE — Transfer of Care (Signed)
Immediate Anesthesia Transfer of Care Note  Patient: Damon Walker  Procedure(s) Performed: TRANSURETHRAL RESECTION OF THE PROSTATE (TURP) BIOPSY TRANSRECTAL ULTRASONIC PROSTATE (TUBP)  Patient Location: PACU  Anesthesia Type:General  Level of Consciousness: awake, oriented, drowsy, and patient cooperative  Airway & Oxygen Therapy: Patient connected to face mask oxygen  Post-op Assessment: Report given to RN and Post -op Vital signs reviewed and stable  Post vital signs: stable  Last Vitals:  Vitals Value Taken Time  BP 129/60 05/27/23 1231  Temp    Pulse 64 05/27/23 1234  Resp 14 05/27/23 1234  SpO2 100 % 05/27/23 1234  Vitals shown include unfiled device data.  Last Pain:  Vitals:   05/27/23 0920  TempSrc:   PainSc: 0-No pain         Complications: No notable events documented.

## 2023-05-27 NOTE — Anesthesia Postprocedure Evaluation (Signed)
Anesthesia Post Note  Patient: Lourdes Osen  Procedure(s) Performed: TRANSURETHRAL RESECTION OF THE PROSTATE (TURP) BIOPSY TRANSRECTAL ULTRASONIC PROSTATE (TUBP)     Patient location during evaluation: PACU Anesthesia Type: General Level of consciousness: awake and alert, patient cooperative and oriented Pain management: pain level controlled Vital Signs Assessment: post-procedure vital signs reviewed and stable Respiratory status: spontaneous breathing, nonlabored ventilation and respiratory function stable Cardiovascular status: blood pressure returned to baseline and stable Postop Assessment: no apparent nausea or vomiting and able to ambulate Anesthetic complications: no   No notable events documented.  Last Vitals:  Vitals:   05/27/23 1346 05/27/23 1400  BP: (!) 140/63 (!) 140/65  Pulse: (!) 58 (!) 56  Resp:    Temp: 36.7 C   SpO2: 91% 93%    Last Pain:  Vitals:   05/27/23 1400  TempSrc:   PainSc: 0-No pain                 Aarilyn Dye,E. Gavriela Cashin

## 2023-05-27 NOTE — Progress Notes (Signed)
Fax received from [facility] on 05/16/23 for medical clearance/medication hold for TURP to be signed by V.W. Myra Gianotti, MD.  Provider signed on 05/26/23, scanned into pt's chart, media routed via MyChart to sender on 05/27/23.

## 2023-05-27 NOTE — H&P (Signed)
H&P  Chief Complaint: BPH, urinary retention, prostate nodule and elevated PSA  History of Present Illness: Damon Walker is a 77 year old male with a history of urinary retention.  He failed a voiding trial.  He had a large right prostate nodule on exam.  His PSA was 24 knowing that it could be elevated due to the retention as well.  September 2024 CT scan of the abdomen and pelvis revealed about a 50 g prostate with borderline enlarged retroperitoneal lymph nodes but no bone lesions.  He was brought today for TURP and transrectal ultrasound of the prostate and prostate biopsy.  He has been well.  He has been on cephalexin for a few days due to the chronic Foley.  His urine culture in the office was negative.  He stopped his Plavix.  He has had no fever.  Urine is clear -no cloudiness or hematuria. Here with his wife who confirms.   Past Medical History:  Diagnosis Date   Carotid arterial disease (HCC) 06/2014; 01/09/2015   Followed by Dr. Arbie Cookey of [facility] -- a) Carotid u/s: 40-59% bilat ICA stenosis; b) 10/2019: Carotid Dopplers October 20, 2019: R ICA 1-39%, LICA 40-59%.  Right vertebral artery appears occluded.  Normal subclavian flow.  Normal left vertebral artery. 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  MRI Brain: Acute L Occipital Lobe Infarct. Chronic microvascular ischemic changes in the white matter and left pons.  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   Chronic combined systolic and diastolic congestive heart failure, NYHA class 2 (HCC) 05/11/2015 - 05/15/15   a. EF 20-25% with anterior-anteroseptal akinesis (immediately post anterior STEMI).  ; b. 10/10/'16 Echo: EF 35-40% with moderate mid-apical anteroseptal, anterior and apical hypokinesis.   Coronary  artery disease involving native heart 05/11/2015   Cath: 3V CAD with 90% prox RCA, 80% mid RCA, 80% OM3, (RCA and OM residual treated medically) 100% LAD - PCI to Prox-Mid LAD with Overlapping Promus Premier DES 3.0 x 38 & 3.0 x 16 (post-dilated to 3.5 mm)    Essential hypertension    GERD (gastroesophageal reflux disease)    H/O: GI bleed 05/15/2015   s/p Sigmoid Polypectomy 05/04/2015 - prior to STEMI on May 11, 2015; Had GI Bleed following TPA for CVA, while on ASA & Brilinta; Flex Sig - 1. Internal Hemorrhoids, 2. Distal sigmoid polypectomy site with flat Whitebase status post biopsy and tattoo with 2mL spot over 3 injections 3. Proximal sigmoid polypectomy site seen as well, 4) otherwise normal with no evidence of bleed   Hyperlipidemia with target LDL less than 70    Ischemic cardiomyopathy 05/11/2015   EF 40% on cath 05/11/2015 after anterior STEMI, EF 20-25% on echo 05/13/2015; 12/2015 =  EF ~45%.   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   Prediabetes 05/2015   A1C 6.0 in Oct 2016   ST elevation myocardial infarction (STEMI) involving left anterior descending (LAD) coronary artery with complication (HCC) 05/11/2015   100% Prox LAD - PCI with overlapping DES x 2   Stroke (HCC)    Tobacco abuse    a. 30 yrs - 1.5 ppd. - Quit 05/11/2015   Type 2 diabetes mellitus with other specified complication (HCC)  10/23/2021   Vision abnormalities    Right hemianopsia   Past Surgical History:  Procedure Laterality Date   CARDIAC CATHETERIZATION N/A 05/11/2015   Procedure: Left Heart Cath and Coronary Angiography;  Surgeon: Peter M Swaziland, MD;  Location: [facility] INVASIVE CV LAB;  Service: Cardiovascular;  100% pLAD (long lesion). RCA - prox 90%, mid 80%. OM2 & OM3 80% (OM2 & 3 not necessarily PCI targets); EF 35-45% with Anterio HK   CARDIAC CATHETERIZATION N/A 05/11/2015   Procedure: Coronary Stent Intervention;  Surgeon: Peter M  [place]il[place], MD;  Location: [facility] INVASIVE CV LAB;  Service: Cardiovascular; PCI to Prox-Mid LAD with Overlapping Promus Premier DES 3.0 x 38 & 3.0 x 16 (post-dilated to 3.5 mm)     FLEXIBLE SIGMOIDOSCOPY N/A 05/18/2015   Procedure: FLEXIBLE SIGMOIDOSCOPY;  Surgeon: Vida Rigger, MD;  Location: [facility] ENDOSCOPY;  Service: Endoscopy;  Laterality: N/A;   NM MYOVIEW LTD  08/2016   EF 30% with large anterior-anteroseptal and apical infarct consistent with LAD infarct. His HIGH RISK because of large infarct. No ischemia.   S/P Appendectomy     Age 85   S/P Inguinal Hernia Repair     In his 20's.   TRANSCAROTID ARTERY REVASCULARIZATION  Left 04/30/2022   Procedure: Transcarotid Artery Revascularization;  Surgeon: Chuck Hint, MD;  Location: [facility] OR;  Service: Vascular;  Laterality: Left;   TRANSTHORACIC ECHOCARDIOGRAM  05/15/2015   Mild LVH, EF 35-40% - Mod HK of mid-apical anteroseptal, anterior & apical walls.  Gr 1 DD. Mild bilateral Atrial Enlargement.   TRANSTHORACIC ECHOCARDIOGRAM  12/2015   EF up to ~40-45% wiht Anterior-Anteroapical HK.  Mod LV dilation with increased LVEDP.   TRANSTHORACIC ECHOCARDIOGRAM  12/'17; 4/'18   a) EF ~30-35% (in setting of HTN Urgency). - Gr 2 DD;; b) EF 30-35% (per Dr. Royann Shivers - 35-40%). Anterior-anteroapical Akinesis. Gr2 DD w/ high filling pressures. Mild LA dilation. Mild RV dilation.   ULTRASOUND GUIDANCE FOR VASCULAR ACCESS Right 04/30/2022   Procedure: ULTRASOUND GUIDANCE FOR VASCULAR ACCESS;  Surgeon: Chuck Hint, MD;  Location: [facility] OR;  Service: Vascular;  Laterality: Right;    Home Medications:  Medications Prior to Admission  Medication Sig Dispense Refill Last Dose   acetaminophen (TYLENOL) 325 MG tablet Take 1-2 tablets (325-650 mg total) by mouth every 4 (four) hours as needed for mild pain.      aspirin EC 81 MG tablet Take 1 tablet (81 mg total) by mouth daily. 30 tablet 12    carvedilol (COREG) 3.125 MG tablet Take 1 tablet (3.125 mg  total) by mouth 2 (two) times daily. 60 tablet 11    clopidogrel (PLAVIX) 75 MG tablet Take 1 tablet (75 mg total) by mouth daily. 30 tablet 0    famotidine (PEPCID) 40 MG tablet Take 1 tablet (40 mg total) by mouth every other day. 15 tablet 0    ferrous sulfate 325 (65 FE) MG tablet Take 1 tablet (325 mg total) by mouth daily with breakfast. 30 tablet 0    finasteride (PROSCAR) 5 MG tablet Take 5 mg by mouth at bedtime.      furosemide (LASIX) 20 MG tablet TAKE 1 TABLET BY MOUTH ONCE DAILY AS NEEDED 30 tablet 8    losartan (COZAAR) 100 MG tablet Take 1 tablet (100 mg total) by mouth daily. 30 tablet 11    nitroGLYCERIN (NITROSTAT) 0.4 MG SL tablet Place 1 tablet (0.4 mg total) under the tongue every 5 (five) minutes x 3 doses as needed  for chest pain. 25 tablet 3    pantoprazole (PROTONIX) 20 MG tablet Take 1 tablet (20 mg total) by mouth every other day. 15 tablet 0    rosuvastatin (CRESTOR) 40 MG tablet Take 1 tablet (40 mg total) by mouth daily at 6 PM. 30 tablet 0    sertraline (ZOLOFT) 25 MG tablet Take 0.5 tablets (12.5 mg total) by mouth at bedtime. 15 tablet 0    tamsulosin (FLOMAX) 0.4 MG CAPS capsule Take 1 capsule (0.4 mg total) by mouth daily. (Patient taking differently: Take 0.4 mg by mouth in the morning and at bedtime.) 30 capsule 0    traZODone (DESYREL) 50 MG tablet Take 1 tablet (50 mg total) by mouth at bedtime. 30 tablet 0    cephALEXin (KEFLEX) 500 MG capsule Take 1 capsule (500 mg total) by mouth 4 (four) times daily. (Patient not taking: Reported on 05/14/2023) 28 capsule 0 Not Taking   Allergies:  Allergies  Allergen Reactions   Latex Hives, Itching, Rash and Other (See Comments)    Blisters    Family History  Adopted: Yes  Problem Relation Age of Onset   Other Mother        eczema    Cancer Mother    Other Other        Adopted - unaware of biological parent's histories.   Stroke Neg Hx    Social History:  reports that he quit smoking about 8 years ago. His  smoking use included cigarettes. He started smoking about 38 years ago. He has a 30 pack-year smoking history. He has been exposed to tobacco smoke. He has never used smokeless tobacco. He reports that he does not drink alcohol and does not use drugs.  ROS: A complete review of systems was performed.  All systems are negative except for pertinent findings as noted. Review of Systems  All other systems reviewed and are negative.    Physical Exam:  Vital signs in last 24 hours: Temp:  [97.7 F (36.5 C)] 97.7 F (36.5 C) (10/22 0901) Pulse Rate:  [65] 65 (10/22 0901) Resp:  [15] 15 (10/22 0901) BP: (142)/(62) 142/62 (10/22 0901) SpO2:  [98 %] 98 % (10/22 0901) General:  Alert and oriented, No acute distress HEENT: Normocephalic, atraumatic Cardiovascular: Regular rate and rhythm Lungs: Regular rate and effort Abdomen: Soft, nontender, nondistended, no abdominal masses Back: No CVA tenderness Extremities: No edema Neurologic: Grossly intact GU: Foley catheter in place-urine clear  Laboratory Data:  No results found for this or any previous visit (from the past 24 hour(s)). No results found for this or any previous visit (from the past 240 hour(s)). Creatinine: No results for input(s): "CREATININE" in the last 168 hours.  Impression/Assessment:  BPH, urinary retention, prostate nodule with elevated PSA and borderline enlarged lymph nodes on CT-  Plan:  I discussed again with the patient and his wife the nature, potential benefits, risks and alternatives to TURP, TRUS prostate and biopsy, including side effects of the proposed treatment, the likelihood of the patient achieving the goals of the procedure, and any potential problems that might occur during the procedure or recuperation. All questions answered. Patient elects to proceed.   Jerilee Field 05/27/2023

## 2023-05-27 NOTE — Op Note (Signed)
Preoperative diagnosis: BPH, urinary retention, prostate nodule with elevated PSA  Postoperative diagnosis: Same  Procedure:  TURP Transrectal ultrasound of the prostate Prostate biopsy  Surgeon: Mena Goes  Anesthesia: General  Indication for procedure: Damon Walker is a 77 year old male who developed urinary retention.  Prostate was about 50 g on imaging.  He is on max medical therapy with finasteride and tamsulosin.  He failed a voiding trial.  On DRE he had a large right prostate nodule consistent with prostate cancer and possible extracapsular extension.  CT scan revealed some retroperitoneal lymphadenopathy.  His PSA was 25.  He was brought today for above procedures.  Findings: On cystoscopy the urethra was unremarkable, prostate obstructed by a short prostate with lateral lobe hypertrophy and a high bladder neck.  The bladder contained no stone or foreign body.  No mucosal lesions but typical posterior catheter irritation.  The ureteral orifice ease were identified and a clear E flux bilaterally.  On DRE again prostate about 30 g with a large right prostate nodule.  Transrectal ultrasound of the prostate revealed a right lateral hypoechoic nodule about 15 mm and a smaller 10 mm left sided hypoechoic nodule.  Otherwise the prostate measured 4.64 x 5.19 x 3.74 cm for a volume of 47.16 mL.  Description of procedure: After consent was obtained patient brought to the operating room.  After adequate anesthesia he is placed in lithotomy position and prepped and draped in the usual sterile fashion.  Timeout was performed to confirm the patient and procedure.  Cystoscope was passed per urethra and the bladder carefully inspected and irrigated several times.  Urine was clear.  The scope was backed out then I placed the continuous flow sheath with the visual obturator and swapped that out for the handle and loop.  Identified the left ureteral orifice again and then came back to the bladder neck.  I made  an incision at about 5:00 on the patient's left side in line with the left ureteral orifice down through the prostate and bladder neck to level that off and then brought that incision down to the verumontanum.  Similarly an incision at 7:00 on the right was made after identifying the right ureteral orifice.  The intervening  median lobe tissue was resected.  The scope certainly moved in and out of his bladder easier at this point.  I then started left anterior at the bladder neck and resected anterior to posterior bladder neck down to the verumontanum on the left and then the right.  This created an excellent channel.  Hemostasis was excellent at low pressure.  All the chips were evacuated.  The scope was backed out and a 22 [place] hematuria catheter was placed without difficulty with 25 cc in the balloon and placed on gentle traction.  Exam under anesthesia was performed.  Transrectal ultrasound was passed per urethra and transrectal ultrasound of the prostate was done.  I then performed a 7 core prostate biopsy with 4 on the right and 3 on the left.  Final exam revealed there to be no bleeding per rectum.  He was then awakened and placed supine.  Catheter was placed on gentle rubber band traction and CBI for wake-up.  Irrigation was clear.  He was taken to the recovery room in stable condition.  Complications: None  Blood loss: 25 mL  Specimens to pathology: #1 TURP chips #2 right base medial #3 right mid medial #4 right apex medial x 2 cores #5 left base medial #6 left mid medial #  7 left apex medial  Drains: 22 [place] hematuria catheter  Disposition: Patient stable to PACU.

## 2023-05-28 ENCOUNTER — Encounter (HOSPITAL_COMMUNITY): Payer: Self-pay | Admitting: Urology

## 2023-06-02 LAB — SURGICAL PATHOLOGY

## 2023-06-10 ENCOUNTER — Other Ambulatory Visit (HOSPITAL_COMMUNITY): Payer: Self-pay | Admitting: Urology

## 2023-06-10 DIAGNOSIS — C778 Secondary and unspecified malignant neoplasm of lymph nodes of multiple regions: Secondary | ICD-10-CM

## 2023-06-10 DIAGNOSIS — C61 Malignant neoplasm of prostate: Secondary | ICD-10-CM

## 2023-06-13 ENCOUNTER — Other Ambulatory Visit: Payer: Self-pay

## 2023-06-13 DIAGNOSIS — I6523 Occlusion and stenosis of bilateral carotid arteries: Secondary | ICD-10-CM

## 2023-06-26 ENCOUNTER — Ambulatory Visit (HOSPITAL_COMMUNITY)
Admission: RE | Admit: 2023-06-26 | Discharge: 2023-06-26 | Disposition: A | Discharge status: Home / Self Care | Payer: Medicare Other | Source: Ambulatory Visit | Attending: Vascular Surgery | Admitting: Vascular Surgery

## 2023-06-26 ENCOUNTER — Ambulatory Visit: Payer: Medicare Other | Admitting: Physician Assistant

## 2023-06-26 VITALS — BP 123/63 | HR 51 | Temp 98.2°F | Ht 67.0 in | Wt 178.6 lb

## 2023-06-26 DIAGNOSIS — I6523 Occlusion and stenosis of bilateral carotid arteries: Secondary | ICD-10-CM

## 2023-06-26 NOTE — Progress Notes (Signed)
Office Note     CC:  follow up Requesting Provider:  [name], MD  HPI: Damon Walker is a 77 y.o. (08-21-1945) male who presents for surveillance of carotid artery stenosis.  He sustained a left parietal stroke.  He was noted to have 60% left ICA stenosis.  He underwent left TCAR with Dr. Edilia Bo on 04/30/2022.  He has not had any further strokelike symptoms including slurring speech, changes in vision, or one-sided weakness.  He continues to take Plavix and statin on a daily basis.  He is a former smoker.   Past Medical History:  Diagnosis Date   Carotid arterial disease (HCC) 06/2014; 01/09/2015   Followed by Dr. Arbie Cookey of [facility] -- a) Carotid u/s: 40-59% bilat ICA stenosis; b) 10/2019: Carotid Dopplers October 20, 2019: R ICA 1-39%, LICA 40-59%.  Right vertebral artery appears occluded.  Normal subclavian flow.  Normal left vertebral artery. 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  MRI Brain: Acute L Occipital Lobe Infarct. Chronic microvascular ischemic changes in the white matter and left pons.  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   Chronic combined systolic and diastolic congestive heart failure, NYHA class 2 (HCC) 05/11/2015 - 05/15/15   a. EF 20-25% with anterior-anteroseptal akinesis (immediately post anterior STEMI).  ; b. 10/10/'16 Echo: EF 35-40% with moderate mid-apical anteroseptal, anterior and apical hypokinesis.   Coronary artery disease involving native heart 05/11/2015   Cath: 3V CAD with 90% prox RCA, 80% mid RCA, 80% OM3, (RCA and OM residual treated medically) 100% LAD - PCI to Prox-Mid LAD with Overlapping Promus Premier DES 3.0 x 38 & 3.0 x 16 (post-dilated to 3.5 mm)    Essential hypertension    GERD (gastroesophageal  reflux disease)    H/O: GI bleed 05/15/2015   s/p Sigmoid Polypectomy 05/04/2015 - prior to STEMI on May 11, 2015; Had GI Bleed following TPA for CVA, while on ASA & Brilinta; Flex Sig - 1. Internal Hemorrhoids, 2. Distal sigmoid polypectomy site with flat Whitebase status post biopsy and tattoo with 2mL spot over 3 injections 3. Proximal sigmoid polypectomy site seen as well, 4) otherwise normal with no evidence of bleed   Hyperlipidemia with target LDL less than 70    Ischemic cardiomyopathy 05/11/2015   EF 40% on cath 05/11/2015 after anterior STEMI, EF 20-25% on echo 05/13/2015; 12/2015 =  EF ~45%.   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   Prediabetes 05/2015   A1C 6.0 in Oct 2016   ST elevation myocardial infarction (STEMI) involving left anterior descending (LAD) coronary artery with complication (HCC) 05/11/2015   100% Prox LAD - PCI with overlapping DES x 2   Stroke (HCC)    Tobacco abuse    a. 30 yrs - 1.5 ppd. - Quit 05/11/2015   Type 2 diabetes mellitus with other specified complication (HCC) 10/23/2021   Vision abnormalities    Right hemianopsia    Past Surgical History:  Procedure Laterality Date   CARDIAC CATHETERIZATION N/A 05/11/2015   Procedure: Left Heart Cath and Coronary Angiography;  Surgeon: Peter M Swaziland, MD;  Location: [facility] INVASIVE CV LAB;  Service: Cardiovascular;  100% pLAD (long lesion). RCA - prox 90%, mid  80%. OM2 & OM3 80% (OM2 & 3 not necessarily PCI targets); EF 35-45% with Anterio HK   CARDIAC CATHETERIZATION N/A 05/11/2015   Procedure: Coronary Stent Intervention;  Surgeon: Peter M Swaziland, MD;  Location: [facility] INVASIVE CV LAB;  Service: Cardiovascular; PCI to Prox-Mid LAD with Overlapping Promus Premier DES 3.0 x 38 & 3.0 x 16 (post-dilated to 3.5 mm)     FLEXIBLE SIGMOIDOSCOPY N/A 05/18/2015   Procedure: FLEXIBLE SIGMOIDOSCOPY;  Surgeon: Vida Rigger, MD;  Location: [facility] ENDOSCOPY;  Service:  Endoscopy;  Laterality: N/A;   NM MYOVIEW LTD  08/2016   EF 30% with large anterior-anteroseptal and apical infarct consistent with LAD infarct. His HIGH RISK because of large infarct. No ischemia.   PROSTATE BIOPSY N/A 05/27/2023   Procedure: BIOPSY TRANSRECTAL ULTRASONIC PROSTATE (TUBP);  Surgeon: Jerilee Field, MD;  Location: [facility] ORS;  Service: Urology;  Laterality: N/A;   S/P Appendectomy     Age 77   S/P Inguinal Hernia Repair     In his 20's.   TRANSCAROTID ARTERY REVASCULARIZATION  Left 04/30/2022   Procedure: Transcarotid Artery Revascularization;  Surgeon: Chuck Hint, MD;  Location: [facility] OR;  Service: Vascular;  Laterality: Left;   TRANSTHORACIC ECHOCARDIOGRAM  05/15/2015   Mild LVH, EF 35-40% - Mod HK of mid-apical anteroseptal, anterior & apical walls.  Gr 1 DD. Mild bilateral Atrial Enlargement.   TRANSTHORACIC ECHOCARDIOGRAM  12/2015   EF up to ~40-45% wiht Anterior-Anteroapical HK.  Mod LV dilation with increased LVEDP.   TRANSTHORACIC ECHOCARDIOGRAM  12/'17; 4/'18   a) EF ~30-35% (in setting of HTN Urgency). - Gr 2 DD;; b) EF 30-35% (per Dr. Royann Shivers - 35-40%). Anterior-anteroapical Akinesis. Gr2 DD w/ high filling pressures. Mild LA dilation. Mild RV dilation.   TRANSURETHRAL RESECTION OF PROSTATE N/A 05/27/2023   Procedure: TRANSURETHRAL RESECTION OF THE PROSTATE (TURP);  Surgeon: Jerilee Field, MD;  Location: [facility] ORS;  Service: Urology;  Laterality: N/A;   ULTRASOUND GUIDANCE FOR VASCULAR ACCESS Right 04/30/2022   Procedure: ULTRASOUND GUIDANCE FOR VASCULAR ACCESS;  Surgeon: Chuck Hint, MD;  Location: [facility] OR;  Service: Vascular;  Laterality: Right;    Social History   Socioeconomic History   Marital status: Married    Spouse name: Not on file   Number of children: 3   Years of education: Not on file   Highest education level: Not on file  Occupational History   Occupation: retired from catering business  Tobacco Use   Smoking status:  Former    Current packs/day: 0.00    Average packs/day: 1 pack/day for 30.0 years (30.0 ttl pk-yrs)    Types: Cigarettes    Start date: 04/05/1985    Quit date: 04/06/2015    Years since quitting: 8.2    Passive exposure: Current (Wife is a smoker)   Smokeless tobacco: Never   Tobacco comments:    Quit the day of his STEMI  Vaping Use   Vaping status: Never Used  Substance and Sexual Activity   Alcohol use: No    Alcohol/week: 0.0 standard drinks of alcohol   Drug use: No   Sexual activity: Not on file  Other Topics Concern   Not on file  Social History Narrative   Lives in [place] with wife. Originally from [place].   Social Determinants of Health   Financial Resource Strain: Not on file  Food Insecurity: Not on file  Transportation Needs: Not on file  Physical Activity: Not on file  Stress: Not on file  Social Connections: Not on file  Intimate Partner Violence: Not on file    Family History  Adopted: Yes  Problem Relation Age of Onset   Other Mother        eczema    Cancer Mother    Other Other        Adopted - unaware of biological parent's histories.   Stroke Neg Hx     Current Outpatient Medications  Medication Sig Dispense Refill   acetaminophen (TYLENOL) 325 MG tablet Take 1-2 tablets (325-650 mg total) by mouth every 4 (four) hours as needed for mild pain.     carvedilol (COREG) 3.125 MG tablet Take 1 tablet (3.125 mg total) by mouth 2 (two) times daily. 60 tablet 11   cephALEXin (KEFLEX) 250 MG capsule Take 1 capsule (250 mg total) by mouth at bedtime. 14 capsule 0   clopidogrel (PLAVIX) 75 MG tablet Take 1 tablet (75 mg total) by mouth daily.     famotidine (PEPCID) 40 MG tablet Take 1 tablet (40 mg total) by mouth every other day. 15 tablet 0   ferrous sulfate 325 (65 FE) MG tablet Take 1 tablet (325 mg total) by mouth daily with breakfast. 30 tablet 0   finasteride (PROSCAR) 5 MG tablet Take 5 mg by mouth at bedtime.     furosemide (LASIX) 20 MG tablet TAKE  1 TABLET BY MOUTH ONCE DAILY AS NEEDED 30 tablet 8   losartan (COZAAR) 100 MG tablet Take 1 tablet (100 mg total) by mouth daily. 30 tablet 11   nitroGLYCERIN (NITROSTAT) 0.4 MG SL tablet Place 1 tablet (0.4 mg total) under the tongue every 5 (five) minutes x 3 doses as needed for chest pain. 25 tablet 3   pantoprazole (PROTONIX) 20 MG tablet Take 1 tablet (20 mg total) by mouth every other day. 15 tablet 0   rosuvastatin (CRESTOR) 40 MG tablet Take 1 tablet (40 mg total) by mouth daily at 6 PM. 30 tablet 0   sertraline (ZOLOFT) 25 MG tablet Take 0.5 tablets (12.5 mg total) by mouth at bedtime. 15 tablet 0   tamsulosin (FLOMAX) 0.4 MG CAPS capsule Take 1 capsule (0.4 mg total) by mouth daily. (Patient taking differently: Take 0.4 mg by mouth in the morning and at bedtime.) 30 capsule 0   traZODone (DESYREL) 50 MG tablet Take 1 tablet (50 mg total) by mouth at bedtime. 30 tablet 0   No current facility-administered medications for this visit.    Allergies  Allergen Reactions   Latex Hives, Itching, Rash and Other (See Comments)    Blisters     REVIEW OF SYSTEMS:   [X]  denotes positive finding, [ ]  denotes negative finding Cardiac  Comments:  Chest pain or chest pressure:    Shortness of breath upon exertion:    Short of breath when lying flat:    Irregular heart rhythm:        Vascular    Pain in calf, thigh, or hip brought on by ambulation:    Pain in feet at night that wakes you up from your sleep:     Blood clot in your veins:    Leg swelling:         Pulmonary    Oxygen at home:    Productive cough:     Wheezing:         Neurologic    Sudden weakness in arms or legs:     Sudden numbness in arms or legs:  Sudden onset of difficulty speaking or slurred speech:    Temporary loss of vision in one eye:     Problems with dizziness:         Gastrointestinal    Blood in stool:     Vomited blood:         Genitourinary    Burning when urinating:     Blood in urine:         Psychiatric    Major depression:         Hematologic    Bleeding problems:    Problems with blood clotting too easily:        Skin    Rashes or ulcers:        Constitutional    Fever or chills:      PHYSICAL EXAMINATION:  Vitals:   06/26/23 1012  BP: 123/63  Pulse: (!) 51  Temp: 98.2 F (36.8 C)  TempSrc: Temporal  SpO2: 95%  Weight: 178 lb 9.6 oz (81 kg)  Height: 5\' 7"  (1.702 m)    General:  WDWN in NAD; vital signs documented above Gait: Not observed HENT: WNL, normocephalic Pulmonary: normal non-labored breathing , without Rales, rhonchi,  wheezing Cardiac: regular HR Abdomen: soft, NT, no masses Skin: without rashes Vascular Exam/Pulses: palpable radial pulses Extremities: without ischemic changes, without Gangrene , without cellulitis; without open wounds;  Musculoskeletal: no muscle wasting or atrophy  Neurologic: A&O X 3; CN grossly intact Psychiatric:  The pt has Normal affect.   Non-Invasive Vascular Imaging:   R ICA 1-39% stenosis L ICA widely patent stent    ASSESSMENT/PLAN:: 77 y.o. male here for follow up for surveillance of carotid artery stenosis.  History of left-sided TCAR  Subjectively the patient has not experienced any further one-sided weakness, slurring speech, or changes in vision.  Carotid duplex demonstrates widely patent left ICA stent.  Right ICA also with 1 to 39% stenosis.  He will continue his Plavix and statin daily.  We will repeat a carotid duplex on a yearly basis.  He will continue to follow regularly with his PCP for management of chronic medical conditions including prediabetes, hypertension, and hyperlipidemia.   Emilie Rutter, PA-C [facility] [phone]  Clinic MD:   Karin Lieu

## 2023-06-27 ENCOUNTER — Encounter (HOSPITAL_COMMUNITY)
Admission: RE | Admit: 2023-06-27 | Discharge: 2023-06-27 | Disposition: A | Discharge status: Home / Self Care | Payer: Medicare Other | Source: Ambulatory Visit | Attending: Urology | Admitting: Urology

## 2023-06-27 DIAGNOSIS — C61 Malignant neoplasm of prostate: Secondary | ICD-10-CM | POA: Insufficient documentation

## 2023-06-27 DIAGNOSIS — C778 Secondary and unspecified malignant neoplasm of lymph nodes of multiple regions: Secondary | ICD-10-CM | POA: Insufficient documentation

## 2023-06-27 MED ORDER — FLOTUFOLASTAT F 18 GALLIUM 296-5846 MBQ/ML IV SOLN
8.5000 | Freq: Once | INTRAVENOUS | Status: AC
  Administered 2023-06-27: 8.5 via INTRAVENOUS

## 2023-07-01 ENCOUNTER — Other Ambulatory Visit: Payer: Self-pay

## 2023-07-01 DIAGNOSIS — I6523 Occlusion and stenosis of bilateral carotid arteries: Secondary | ICD-10-CM

## 2023-07-10 DIAGNOSIS — K219 Gastro-esophageal reflux disease without esophagitis: Secondary | ICD-10-CM | POA: Diagnosis not present

## 2023-07-23 DIAGNOSIS — C61 Malignant neoplasm of prostate: Secondary | ICD-10-CM | POA: Diagnosis not present

## 2023-07-24 DIAGNOSIS — C61 Malignant neoplasm of prostate: Secondary | ICD-10-CM | POA: Diagnosis not present

## 2023-08-21 ENCOUNTER — Telehealth: Payer: Self-pay | Admitting: Cardiology

## 2023-08-21 NOTE — Telephone Encounter (Signed)
Pt c/o BP issue: STAT if pt c/o blurred vision, one-sided weakness or slurred speech  1. What are your last 5 BP readings?   1/13  138/63 1/14  149/74 1/15  142/66 1/16  158/70  2. Are you having any other symptoms (ex. Dizziness, headache, blurred vision, passed out)?   No  3. What is your BP issue?   Wife stated patient's normal BP reading is in the 120's and she is concerned his BP has been trending high.  Wife stated patient was recently diagnosed with Stage 4 prostate cancer.  Wife wants to know if patient can take additional carvedilol (COREG) 3.125 MG tablet.

## 2023-08-21 NOTE — Telephone Encounter (Signed)
Pt c/o BP issue: STAT if pt c/o blurred vision, one-sided weakness or slurred speech   1. What are your last 5 BP readings?    1/13  138/63 1/14  149/74 1/15  142/66 1/16  158/70   2. Are you having any other symptoms (ex. Dizziness, headache, blurred vision, passed out)?   No   3. What is your BP issue?    Wife stated patient's normal BP reading is in the 120's and she is concerned his BP has been trending high.  Wife stated patient was recently diagnosed with Stage 4 prostate cancer.  Wife wants to know if patient can take additional carvedilol (COREG) 3.125 MG tablet.    Called and spoke with wife who states that for the month of January he has had several top blood pressure numbers in the 150s. Nothing higher. He has been under a lot of stress with his new cancer diagnosis. Denies any other symptoms. Advised that I will forward her message to Dr Herbie Baltimore to advise on any changes in blood pressure medications and will let them know his answer.

## 2023-08-21 NOTE — Telephone Encounter (Signed)
Overall, not too concerning.  Needed know what his heart rate is.  If his heart rates are stable over 60 bpm, would have him double his carvedilol dose for now.   Damon Lemma, MD   Called patient's wife Dionne Milo and read the above message to her regarding Dr Herbie Baltimore needing to know what the patients heart rate is. Instructed her to start monitoring heart rate is she has not been and to call us back with the results.

## 2023-08-21 NOTE — Telephone Encounter (Signed)
Overall, not too concerning.  Needed know what his heart rate is.  If his heart rates are stable over 60 bpm, would have him double his carvedilol dose for now.  Bryan Lemma, MD

## 2023-08-27 DIAGNOSIS — I2510 Atherosclerotic heart disease of native coronary artery without angina pectoris: Secondary | ICD-10-CM | POA: Diagnosis not present

## 2023-08-27 DIAGNOSIS — E785 Hyperlipidemia, unspecified: Secondary | ICD-10-CM | POA: Diagnosis not present

## 2023-08-27 DIAGNOSIS — E1169 Type 2 diabetes mellitus with other specified complication: Secondary | ICD-10-CM | POA: Diagnosis not present

## 2023-08-27 DIAGNOSIS — Z23 Encounter for immunization: Secondary | ICD-10-CM | POA: Diagnosis not present

## 2023-08-27 DIAGNOSIS — I10 Essential (primary) hypertension: Secondary | ICD-10-CM | POA: Diagnosis not present

## 2023-08-27 DIAGNOSIS — N1831 Chronic kidney disease, stage 3a: Secondary | ICD-10-CM | POA: Diagnosis not present

## 2023-09-03 ENCOUNTER — Encounter: Payer: Self-pay | Admitting: Cardiology

## 2023-09-03 ENCOUNTER — Ambulatory Visit: Payer: Medicare Other | Attending: Cardiology | Admitting: Cardiology

## 2023-09-03 VITALS — BP 110/58 | HR 61 | Ht 67.0 in | Wt 185.4 lb

## 2023-09-03 DIAGNOSIS — I10 Essential (primary) hypertension: Secondary | ICD-10-CM | POA: Diagnosis not present

## 2023-09-03 DIAGNOSIS — I255 Ischemic cardiomyopathy: Secondary | ICD-10-CM | POA: Diagnosis not present

## 2023-09-03 DIAGNOSIS — R7303 Prediabetes: Secondary | ICD-10-CM

## 2023-09-03 DIAGNOSIS — Z9861 Coronary angioplasty status: Secondary | ICD-10-CM

## 2023-09-03 DIAGNOSIS — I2109 ST elevation (STEMI) myocardial infarction involving other coronary artery of anterior wall: Secondary | ICD-10-CM | POA: Diagnosis not present

## 2023-09-03 DIAGNOSIS — I2510 Atherosclerotic heart disease of native coronary artery without angina pectoris: Secondary | ICD-10-CM | POA: Diagnosis not present

## 2023-09-03 DIAGNOSIS — I63532 Cerebral infarction due to unspecified occlusion or stenosis of left posterior cerebral artery: Secondary | ICD-10-CM

## 2023-09-03 DIAGNOSIS — E785 Hyperlipidemia, unspecified: Secondary | ICD-10-CM

## 2023-09-03 DIAGNOSIS — C61 Malignant neoplasm of prostate: Secondary | ICD-10-CM

## 2023-09-03 NOTE — Patient Instructions (Signed)
Medication Instructions:   No changes  *If you need a refill on your cardiac medications before your next appointment, please call your pharmacy*   Lab Work:  Not needed   Testing/Procedures:  Not needed  Follow-Up: At [facility], you and your health needs are our priority.  As part of our continuing mission to provide you with exceptional heart care, we have created designated Provider Care Teams.  These Care Teams include your primary Cardiologist (physician) and Advanced Practice Providers (APPs -  Physician Assistants and Nurse Practitioners) who all work together to provide you with the care you need, when you need it.     Your next appointment:   12 month(s)  The format for your next appointment:   In Person  Provider:   Bryan Lemma, MD    Other Instructions    Your physician discussed the importance of regular exercise ( walking) and recommended that you start or continue a regular exercise program for good health.

## 2023-09-03 NOTE — Progress Notes (Unsigned)
Cardiology Office Note:  .   Date:  09/04/2023  ID:  [name], DOB 05-06-1946, MRN [id number] PCP: [name], MD  Damon Walker Cardiologist:  Bryan Lemma, MD     Chief Complaint  Patient presents with   Follow-up    Doing well.  Stable   Coronary Artery Disease    No angina   Congestive Heart Failure    Ischemic cardiomyopathy with no significant heart failure symptoms.    Patient Profile: .     Damon Walker is a pleasant, borderline obese 78 y.o. male former smoker with a PMH notable for CAD, ICM, CVA, history of PE along with CRS of HTN, HLD, CKD 3 who presents here for for annual f/u at the request of [name], MD.  Majority of History provided by his wife -- he has had issues with dementia since the CVA/MI/PE  CV History: ANTERIOR STEMI --> LAD PCI in October 2016 --  followed by CVA  (L Occipital Lobe)- => R eye vision loss. 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  Hypertension, Hyperlipidemia and Stage IIIa Chronic Kidney Disease, Known R Vert A occlusion ,  Activity limited by hip and back pain    I last saw Damon Walker about a year ago on September 02, 2022.  He usually accompanied by his wife who provides most the history.  He he had been evaluated with followed Dopplers post left TCAR showing patent stent.  He had been evaluated with a Zio patch monitor showing mostly sinus rhythm rare PACs PVCs no sustained arrhythmias short runs of NSVT and atrial runs.  Echo showed EF of 30-35 percent with global hypokinesis and apical and anterior wall akinesis.  He is feeling a lot better speech improved but still having some word finding issues with expressive aphasia.  Memory improving.  Still not very engaging.  Blood pressure  usually better controlled at home than in the clinic visits.  Overall poor energy and not very active.  No active cardiac symptoms.  Noted hip and back pain that limits activity. We reduced carvedilol 3.125 mg twice daily and decreased losartan to 100 mg daily  He was last seen by Lalla Brothers, PA back on March 05, 2023: Was doing well.  No major issues.  No extra Lasix.  No med changes.  Subjective  Discussed the use of AI scribe software for clinical note transcription with the patient, who gave verbal consent to proceed.  History of Present Illness   The patient is a 78 year old male with a history of heart attack, stroke, pulmonary embolism, and stage four prostate cancer who presents for a cardiology follow-up.  He has a history of a heart attack in 2016, followed by stent placement. He also has a history of stroke and pulmonary embolism. He is not on blood thinners but continues to take Plavix. He reports no chest pain, syncope, palpitations, or use of nitroglycerin. He is able to ambulate around the house but does not engage in regular exercise. No orthopnea, paroxysmal nocturnal dyspnea, or peripheral edema.  He was diagnosed with stage four prostate cancer, which may be affecting his blood pressure. He has not started new cancer medications due to insurance issues and is concerned about the cost, reportedly $3500 per month.  His current medications include  carvedilol 3.25 mg twice daily, losartan 100 mg daily, and Lasix 20 mg as needed, which he uses about twice a week. He also takes rosuvastatin. His blood pressure fluctuates significantly, with readings from 106/56 to 165/?.  Recent lab results from August 27, 2023, show an A1c of 5.9, glucose of 97, BUN of 27, creatinine of 1.17, and normal sodium and potassium levels. Blood counts are within normal limits with a white count of 6.7, hemoglobin of 13.4, and platelets of 217.   BP log shows pressures ranging from 110 / 60s to 150s / 70-80s.     Cardiovascular ROS: no chest pain or dyspnea on exertion positive for - begin exercise intolerance, not very mobile.  Mostly limited by MSK pain.  Wife is mostly concerned about BP fluctuations.-BP log reviewed; edema well-controlled negative for - irregular heartbeat, orthopnea, palpitations, paroxysmal nocturnal dyspnea, rapid heart rate, shortness of breath, or lightheadedness or dizziness.  Syncope or near syncope.  TIA/amaurosis fugax (no new stroke symptoms, just stable baseline, claudication  ROS:  Review of Systems - Negative except hip and back pain as noted.  Memory loss.  Low energy, but also potentially anhedonia    Objective   Medications -Cardiac: Plavix 75 mg daily, - Carvedilol 3.25 mg twice a day, - Losartan 100 mg daily, - Lasix 20 mg twice a week; - Rosuvastatin 40 mg daily-PRN nitroglycerin -Urology: - Finasteride 5 mg nightly and tamsulosin 0.4 mg daily GI: Pepcid 40 mg every other day, Protonix 20 mg a day. -Psych: Zoloft 25 mg daily; -trazodone 50 mg nightly - Keflex (antibiotic)  Studies Reviewed: Marland Kitchen   EKG Interpretation Date/Time:  Wednesday September 03 2023 08:48:56 EST Ventricular Rate:  61 PR Interval:  196 QRS Duration:  94 QT Interval:  438 QTC Calculation: 440 R Axis:   -41  Text Interpretation: Normal sinus rhythm Left axis deviation Minimal voltage criteria for LVH, may be normal variant ( R in aVL ) Anteroseptal infarct , age undetermined ST & T wave abnormality, consider lateral ischemia When compared with ECG of 24-Mar-2023 18:16, No significant change since last tracing Confirmed by Bryan Lemma ([id number]) on 09/03/2023 9:30:21 AM   Carotid Dopplers June 26, 2023: Right ICA 1-39%.  Nonsignificant plaque in the CCA.  Patent stent in the left ICA.  Bilateral vertebral and bilateral subclavian arteries normal.  Lab Results  Component Value Date   CHOL 86 05/01/2022   HDL 22 (L) 05/01/2022   LDLCALC 43 05/01/2022   TRIG 104 05/01/2022   CHOLHDL  3.9 05/01/2022   LABS (02/2023) Total cholesterol: 121 mg/dL ; Triglycerides: 111 mg/dL;HDL: 34 mg/dL\;LDL: 67 mg/dL (60/45/4098) J1B: 1.4%; Creatinine: 1.17 mg/dL; Glucose: 97 mg/dL; BUN: 27 mg/dL; Sodium: 140 mmol/L; Potassium: 4.7 mmol/L; WBC: 6.7 x10^3/L; Hb: 13.4 g/dL; PLT: 782 N56^2/Z  Risk Assessment/Calculations:            Physical Exam:   VS:  BP (!) 110/58 (BP Location: Left Arm, Patient Position: Sitting)   Pulse 61   Ht 5\' 7"  (1.702 m)   Wt 185 lb 6.4 oz (84.1 kg)   SpO2 96%   BMI 29.04 kg/m    Wt Readings from Last 3 Encounters:  09/03/23 185 lb 6.4 oz (84.1 kg)  06/26/23 178 lb 9.6 oz (81 kg)  05/27/23 175 lb (79.4 kg)    GEN: Well nourished, well groomed in no acute distress; somewhat chronically ill but otherwise healthy-appearing.  Borderline obese.  Not very engaging.  Does answer some questions but mostly  his wife provides answers. NECK: No JVD; No carotid bruits CARDIAC: Distant heart sounds but relatively normal S1, S2; RRR, no murmurs, rubs, gallops; diminished pedal pulses. RESPIRATORY:  Clear to auscultation without rales, wheezing or rhonchi ; nonlabored, good air movement. ABDOMEN: Soft, non-tender, non-distended EXTREMITIES:  No edema; No deformity; abnormal gait.    ASSESSMENT AND PLAN: .    Problem List Items Addressed This Visit       Cardiology Problems   CAD S/P percutaneous coronary angioplasty (Chronic)   History of LAD PCI. Currently on clopidogrel monotherapy especially based on his stroke history as well. -Continue Plavix/clopidogrel 75 mg SAPT -Okay to hold Plavix 5 to 7 days preop for surgeries or procedures. -Okay to hold Plavix 3 to 5 days as needed for bleeding or bruising.      Cardiomyopathy, ischemic: EF most recently 35-40%) (Chronic)   Most recent echo showed EF of 30 to 35%.  Relatively inactive and hypotension is limited our titration of medications.  Wife shows fluctuating blood pressures, but I think we can allow for  blood pressure range in the 120s to 140s as an average again advanced age and lack of mobility. NYHA class 2. Ejection fraction 30-35%. No symptoms of heart failure reported. No leg swelling.  Standing dose of Lasix 20 mg with additional Lasix 20mg  used as needed.  -Was unable to tolerate Entresto, but doing well on 100 mg losartan -Overall, energy level is better with reduced dose of the carvedilol -Continue current plan with standing Lasix and sliding scale. -Given euvolemic state, will hold off on spironolactone -If he were to require additional diuretic, would potentially consider SGLT2 inhibitor, but for now we will hold off since he is stable.    Plan is to minimize adjustments to keep things simple but especially in light of his prostate cancer issues.       Coronary artery disease involving native coronary artery of native heart without angina pectoris - Primary (Chronic)   Anterior STEMI with LAD PCI and reduced EF.  Thankfully no active anginal symptoms. Minimal activity, I did try to encourage increased activity. Currently managed with low-dose carvedilol 3.125 mg daily (reduced because of low energy levels), losartan 100 mg daily, rosuvastatin 40 mg daily and Plavix 75 mg monotherapy given his history of stroke and MI Has not required PRN nitroglycerin -Continue current medicines -Encourage increased physical activity as tolerated.      Essential hypertension (Chronic)   Blood pressure fluctuating between 105-165. No symptoms of hypotension or hypertension reported. Currently on Carvedilol 3.25mg  twice daily and Losartan 100mg  daily. Lasix 20mg  used as needed for blood pressure control. -Continue current regimen. -Consider increasing evening dose of Carvedilol if blood pressure consistently over 160. -Check blood pressure only if symptomatic.      Relevant Orders   EKG 12-Lead (Completed)   Hyperlipidemia with target low density lipoprotein (LDL) cholesterol less than 55  mg/dL (Chronic)   Well controlled on Rosuvastatin 40 mg daily.  Total cholesterol 121, triglycerides 111, HDL 34, LDL 67 as of July 1610. Given his multiple comorbidities, would not want any more aggressive. -Continue current regimen.      ST elevation myocardial infarction (STEMI) of anterior wall, subsequent episode of care [facility]) (Chronic)   Dyspnea of relatively large anterior infarct with significant anterior hypokinesis and reduced EF. Thankfully, no further anginal symptoms and heart failure symptoms seem to be stabilized.  He is quite debilitated from musculoskeletal pains in his back and hip.  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  History of TCAR to the left carotid. Remains on Plavix and rosuvastatin with adequate BP control.       Prediabetes (Chronic)   Most recent A1c was 5.9.  Continue to monitor.  At present no indication for SGLT2 inhibitor.      Prostate cancer (HCC) (Chronic)   New diagnosis - Stage 4 Prostate Cancer . Starting two new medications (Orgovyx and Nubeqa). -No changes to cardiac medications at this time.       Follow-Up: Return in about 1 year (around 09/02/2024) for Routine follow up with me.     Signed, Marykay Lex, MD, MS Bryan Lemma, M.D., M.S. Interventional Cardiologist  [facility]  Pager # [phone] Phone # [phone] [address] [place], [place] [address]

## 2023-09-04 ENCOUNTER — Encounter: Payer: Self-pay | Admitting: Cardiology

## 2023-09-04 DIAGNOSIS — C61 Malignant neoplasm of prostate: Secondary | ICD-10-CM | POA: Insufficient documentation

## 2023-09-04 NOTE — Assessment & Plan Note (Addendum)
Anterior STEMI with LAD PCI and reduced EF.  Thankfully no active anginal symptoms. Minimal activity, I did try to encourage increased activity. Currently managed with low-dose carvedilol 3.125 mg daily (reduced because of low energy levels), losartan 100 mg daily, rosuvastatin 40 mg daily and Plavix 75 mg monotherapy given his history of stroke and MI Has not required PRN nitroglycerin -Continue current medicines -Encourage increased physical activity as tolerated.

## 2023-09-04 NOTE — Assessment & Plan Note (Signed)
New diagnosis - Stage 4 Prostate Cancer . Starting two new medications (Orgovyx and Nubeqa). -No changes to cardiac medications at this time.

## 2023-09-04 NOTE — Assessment & Plan Note (Deleted)
Anterior STEMI with LAD PCI and reduced EF.  Thankfully no active anginal symptoms. Minimal activity, I did try to encourage increased activity. Currently managed with low-dose carvedilol 3.125 mg daily (reduced because of low energy levels), losartan 100 mg daily, rosuvastatin 40 mg daily and Plavix 75 mg monotherapy given his history of stroke and MI Has not required PRN nitroglycerin -Continue current medicines

## 2023-09-04 NOTE — Assessment & Plan Note (Signed)
History of LAD PCI. Currently on clopidogrel monotherapy especially based on his stroke history as well. -Continue Plavix/clopidogrel 75 mg SAPT -Okay to hold Plavix 5 to 7 days preop for surgeries or procedures. -Okay to hold Plavix 3 to 5 days as needed for bleeding or bruising.

## 2023-09-04 NOTE — Assessment & Plan Note (Signed)
Blood pressure fluctuating between 105-165. No symptoms of hypotension or hypertension reported. Currently on Carvedilol 3.25mg  twice daily and Losartan 100mg  daily. Lasix 20mg  used as needed for blood pressure control. -Continue current regimen. -Consider increasing evening dose of Carvedilol if blood pressure consistently over 160. -Check blood pressure only if symptomatic.

## 2023-09-04 NOTE — Assessment & Plan Note (Signed)
Most recent echo showed EF of 30 to 35%.  Relatively inactive and hypotension is limited our titration of medications.  Wife shows fluctuating blood pressures, but I think we can allow for blood pressure range in the 120s to 140s as an average again advanced age and lack of mobility. NYHA class 2. Ejection fraction 30-35%. No symptoms of heart failure reported. No leg swelling.  Standing dose of Lasix 20 mg with additional Lasix 20mg  used as needed.  -Was unable to tolerate Entresto, but doing well on 100 mg losartan -Overall, energy level is better with reduced dose of the carvedilol -Continue current plan with standing Lasix and sliding scale. -Given euvolemic state, will hold off on spironolactone -If he were to require additional diuretic, would potentially consider SGLT2 inhibitor, but for now we will hold off since he is stable.    Plan is to minimize adjustments to keep things simple but especially in light of his prostate cancer issues.

## 2023-09-04 NOTE — Assessment & Plan Note (Signed)
Dyspnea of relatively large anterior infarct with significant anterior hypokinesis and reduced EF. Thankfully, no further anginal symptoms and heart failure symptoms seem to be stabilized.  He is quite debilitated from musculoskeletal pains in his back and hip.

## 2023-09-04 NOTE — Assessment & Plan Note (Signed)
Overall doing pretty well.  History of TCAR to the left carotid. Remains on Plavix and rosuvastatin with adequate BP control.

## 2023-09-04 NOTE — Assessment & Plan Note (Signed)
Well controlled on Rosuvastatin 40 mg daily.  Total cholesterol 121, triglycerides 111, HDL 34, LDL 67 as of July 1610. Given his multiple comorbidities, would not want any more aggressive. -Continue current regimen.

## 2023-09-04 NOTE — Assessment & Plan Note (Signed)
Most recent A1c was 5.9.  Continue to monitor.  At present no indication for SGLT2 inhibitor.

## 2023-09-23 ENCOUNTER — Other Ambulatory Visit: Payer: Self-pay | Admitting: Cardiology

## 2023-11-04 ENCOUNTER — Other Ambulatory Visit: Payer: Self-pay | Admitting: Cardiology

## 2024-01-05 ENCOUNTER — Ambulatory Visit: Payer: Self-pay

## 2024-01-05 ENCOUNTER — Ambulatory Visit: Payer: Medicare (Managed Care) | Admitting: Urology

## 2024-01-05 VITALS — BP 128/68 | HR 73

## 2024-01-05 DIAGNOSIS — C772 Secondary and unspecified malignant neoplasm of intra-abdominal lymph nodes: Secondary | ICD-10-CM

## 2024-01-05 DIAGNOSIS — N401 Enlarged prostate with lower urinary tract symptoms: Secondary | ICD-10-CM

## 2024-01-05 DIAGNOSIS — R3915 Urgency of urination: Secondary | ICD-10-CM | POA: Diagnosis not present

## 2024-01-05 DIAGNOSIS — C61 Malignant neoplasm of prostate: Secondary | ICD-10-CM | POA: Diagnosis not present

## 2024-01-05 LAB — URINALYSIS, ROUTINE W REFLEX MICROSCOPIC
Bilirubin, UA: NEGATIVE
Glucose, UA: NEGATIVE
Ketones, UA: NEGATIVE
Nitrite, UA: NEGATIVE
Protein,UA: NEGATIVE
RBC, UA: NEGATIVE
Specific Gravity, UA: 1.025 (ref 1.005–1.030)
Urobilinogen, Ur: 0.2 mg/dL (ref 0.2–1.0)
pH, UA: 6 (ref 5.0–7.5)

## 2024-01-05 LAB — MICROSCOPIC EXAMINATION: Bacteria, UA: NONE SEEN

## 2024-01-05 MED ORDER — LEUPROLIDE ACETATE (6 MONTH) 45 MG IM KIT
45.0000 mg | PACK | INTRAMUSCULAR | Status: AC
Start: 2024-01-07 — End: 2024-12-31
  Administered 2024-01-07: 45 mg via INTRAMUSCULAR

## 2024-01-05 MED ORDER — TAMSULOSIN HCL 0.4 MG PO CAPS: 0.4000 mg | ORAL_CAPSULE | Freq: Every day | ORAL | 3 refills | Status: DC

## 2024-01-05 MED ORDER — LEUPROLIDE ACETATE (6 MONTH) 45 MG IM KIT: 45.0000 mg | PACK | Freq: Once | INTRAMUSCULAR | Status: DC

## 2024-01-05 NOTE — Progress Notes (Signed)
 [facility]  [address] [place], [place] [address] [phone]  01/05/2024 11:19 AM   Damon Walker 1946-01-19 [id number]  Referring provider: Ronna Coho, MD [address] [place],  [place] [address]  No chief complaint on file.   HPI:  New pt -   1) metastatic prostate cancer-patient presented in August 2024 in retention with a PSA of 24. He had a nodular prostate on exam. He underwent TURP and prostate biopsy October 2024. 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 PET scan from November 2024 revealed prostate gland activity, left and right seminal vesicle invasion, bilateral pelvic, retroperitoneal, mediastinal and supraclavicular lymphadenopathy. No bone lesions. He was started on Eligard  45 mg Dec 2024 with Eligard  22.5 mg 3 month and Nubeqa Mar 2025. His Apr 2025 PSA 1.2.   Staging:  September 2024-CT scan-prostate 70 g, pelvic and retroperitoneal lymphadenopathy.  Nov 2024 - PSMA PET - revealed prostate gland activity, left and right seminal vesicle invasion, bilateral pelvic, retroperitoneal, mediastinal and supraclavicular lymphadenopathy. No bone lesions.  Today, "Damon Walker" is seen as new pt for above. Insurance changed and he needed to switch to a [employer]. He is voiding with a good stream. Some incontinence. No pads or diapers.      PMH: Past Medical History:  Diagnosis Date   Carotid arterial disease (HCC) 06/2014; 01/09/2015   Followed by Dr. Shirley Douglas of [facility] -- a) Carotid u/s: 40-59% bilat ICA stenosis; b) 10/2019: Carotid Dopplers October 20, 2019: R ICA 1-39%, LICA 40-59%.  Right vertebral artery appears occluded.  Normal subclavian flow.  Normal left vertebral artery. 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  MRI Brain: Acute L Occipital Lobe Infarct.  Chronic microvascular ischemic changes in the white matter and left pons.  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   Chronic combined systolic and diastolic congestive heart failure, NYHA class 2 (HCC) 05/11/2015 - 05/15/15   a. EF 20-25% with anterior-anteroseptal akinesis (immediately post anterior STEMI).  ; b. 10/10/'16 Echo: EF 35-40% with moderate mid-apical anteroseptal, anterior and apical hypokinesis.   Coronary artery disease involving native heart 05/11/2015   Cath: 3V CAD with 90% prox RCA, 80% mid RCA, 80% OM3, (RCA and OM residual treated medically) 100% LAD - PCI to Prox-Mid LAD with Overlapping Promus Premier DES 3.0 x 38 & 3.0 x 16 (post-dilated to 3.5 mm)    Essential hypertension    GERD (gastroesophageal reflux disease)    H/O: GI bleed 05/15/2015   s/p Sigmoid Polypectomy 05/04/2015 - prior to STEMI on May 11, 2015; Had GI Bleed following TPA for CVA, while on ASA & Brilinta ; Flex Sig - 1. Internal Hemorrhoids, 2. Distal sigmoid polypectomy site with flat [place] status post biopsy and tattoo with 2mL spot over 3 injections 3. Proximal sigmoid polypectomy site seen as well, 4) otherwise normal with no evidence of bleed   Hyperlipidemia with target LDL less than 70    Ischemic cardiomyopathy 05/11/2015   EF 40% on cath 05/11/2015 after anterior STEMI, EF 20-25% on echo 05/13/2015; 12/2015 =  EF ~45%.   Liver disease, chronic, with cirrhosis (HCC)  11/2019   Mild possible early cirrhotic liver disease noted on CT scan.   Prediabetes 05/2015   A1C 6.0 in Oct 2016   ST elevation myocardial infarction (STEMI) involving left anterior descending (LAD) coronary artery with complication (HCC) 05/11/2015   100% Prox LAD - PCI with overlapping DES x 2   Stroke (HCC)    Tobacco abuse    a. 30 yrs - 1.5 ppd. - Quit 05/11/2015    Type 2 diabetes mellitus with other specified complication (HCC) 10/23/2021   Vision abnormalities    Right hemianopsia    Surgical History: Past Surgical History:  Procedure Laterality Date   CARDIAC CATHETERIZATION N/A 05/11/2015   Procedure: Left Heart Cath and Coronary Angiography;  Surgeon: Peter M Swaziland, MD;  Location: [facility] INVASIVE CV LAB;  Service: Cardiovascular;  100% pLAD (long lesion). RCA - prox 90%, mid 80%. OM2 & OM3 80% (OM2 & 3 not necessarily PCI targets); EF 35-45% with Anterio HK   CARDIAC CATHETERIZATION N/A 05/11/2015   Procedure: Coronary Stent Intervention;  Surgeon: Peter M Swaziland, MD;  Location: [facility] INVASIVE CV LAB;  Service: Cardiovascular; PCI to Prox-Mid LAD with Overlapping Promus Premier DES 3.0 x 38 & 3.0 x 16 (post-dilated to 3.5 mm)     FLEXIBLE SIGMOIDOSCOPY N/A 05/18/2015   Procedure: FLEXIBLE SIGMOIDOSCOPY;  Surgeon: Ozell Blunt, MD;  Location: [facility] ENDOSCOPY;  Service: Endoscopy;  Laterality: N/A;   NM MYOVIEW  LTD  08/2016   EF 30% with large anterior-anteroseptal and apical infarct consistent with LAD infarct. His HIGH RISK because of large infarct. No ischemia.   PROSTATE BIOPSY N/A 05/27/2023   Procedure: BIOPSY TRANSRECTAL ULTRASONIC PROSTATE (TUBP);  Surgeon: Christina Coyer, MD;  Location: [facility] ORS;  Service: Urology;  Laterality: N/A;   S/P Appendectomy     Age 78   S/P Inguinal Hernia Repair     In his 20's.   TRANSCAROTID ARTERY REVASCULARIZATION  Left 04/30/2022   Procedure: Transcarotid Artery Revascularization;  Surgeon: Dannis Dy, MD;  Location: [facility] OR;  Service: Vascular;  Laterality: Left;   TRANSTHORACIC ECHOCARDIOGRAM  05/15/2015   Mild LVH, EF 35-40% - Mod HK of mid-apical anteroseptal, anterior & apical walls.  Gr 1 DD. Mild bilateral Atrial Enlargement.   TRANSTHORACIC ECHOCARDIOGRAM  12/2015   EF up to ~40-45% wiht Anterior-Anteroapical HK.  Mod LV dilation with increased LVEDP.   TRANSTHORACIC ECHOCARDIOGRAM  12/'17; 4/'18    a) EF ~30-35% (in setting of HTN Urgency). - Gr 2 DD;; b) EF 30-35% (per Dr. Alvis Ba - 35-40%). Anterior-anteroapical Akinesis. Gr2 DD w/ high filling pressures. Mild LA dilation. Mild RV dilation.   TRANSURETHRAL RESECTION OF PROSTATE N/A 05/27/2023   Procedure: TRANSURETHRAL RESECTION OF THE PROSTATE (TURP);  Surgeon: Christina Coyer, MD;  Location: [facility] ORS;  Service: Urology;  Laterality: N/A;   ULTRASOUND GUIDANCE FOR VASCULAR ACCESS Right 04/30/2022   Procedure: ULTRASOUND GUIDANCE FOR VASCULAR ACCESS;  Surgeon: Dannis Dy, MD;  Location: [facility] OR;  Service: Vascular;  Laterality: Right;    Home Medications:  Allergies as of 01/05/2024       Reactions   Latex Hives, Itching, Rash, Other (See Comments)   Blisters        Medication List        Accurate as of January 05, 2024 11:19 AM. If you have any questions, ask your nurse or doctor.          acetaminophen  325 MG tablet Commonly known as: TYLENOL  Take 1-2 tablets (325-650 mg total)  by mouth every 4 (four) hours as needed for mild pain.   carvedilol  3.125 MG tablet Commonly known as: COREG  Take 1 tablet by mouth twice daily   cephALEXin  250 MG capsule Commonly known as: KEFLEX  Take 1 capsule (250 mg total) by mouth at bedtime.   clopidogrel  75 MG tablet Commonly known as: PLAVIX  Take 1 tablet (75 mg total) by mouth daily.   famotidine  40 MG tablet Commonly known as: PEPCID  Take 1 tablet (40 mg total) by mouth every other day.   FeroSul 325 (65 Fe) MG tablet Generic drug: ferrous sulfate  Take 1 tablet (325 mg total) by mouth daily with breakfast.   finasteride 5 MG tablet Commonly known as: PROSCAR Take 5 mg by mouth at bedtime.   furosemide  20 MG tablet Commonly known as: LASIX  TAKE 1 TABLET BY MOUTH ONCE DAILY AS NEEDED   losartan  100 MG tablet Commonly known as: COZAAR  Take 1 tablet by mouth once daily   nitroGLYCERIN  0.4 MG SL tablet Commonly known as: NITROSTAT  Place 1 tablet (0.4 mg  total) under the tongue every 5 (five) minutes x 3 doses as needed for chest pain.   Nubeqa 300 MG tablet Generic drug: darolutamide Take 600 mg by mouth 2 (two) times daily.   pantoprazole  20 MG tablet Commonly known as: PROTONIX  Take 1 tablet (20 mg total) by mouth every other day.   rosuvastatin  40 MG tablet Commonly known as: CRESTOR  Take 1 tablet (40 mg total) by mouth daily at 6 PM.   sertraline  25 MG tablet Commonly known as: ZOLOFT  Take 0.5 tablets (12.5 mg total) by mouth at bedtime.   tamsulosin  0.4 MG Caps capsule Commonly known as: Flomax  Take 1 capsule (0.4 mg total) by mouth daily. What changed: when to take this   traZODone  50 MG tablet Commonly known as: DESYREL  Take 1 tablet (50 mg total) by mouth at bedtime.        Allergies:  Allergies  Allergen Reactions   Latex Hives, Itching, Rash and Other (See Comments)    Blisters    Family History: Family History  Adopted: Yes  Problem Relation Age of Onset   Other Mother        eczema    Cancer Mother    Other Other        Adopted - unaware of biological parent's histories.   Stroke Neg Hx     Social History:  reports that he quit smoking about 8 years ago. His smoking use included cigarettes. He started smoking about 38 years ago. He has a 30 pack-year smoking history. He has been exposed to tobacco smoke. He has never used smokeless tobacco. He reports that he does not drink alcohol and does not use drugs.   Physical Exam: BP 128/68   Pulse 73   Constitutional:  Alert and oriented, No acute distress. HEENT: [place] AT, moist mucus membranes.  Trachea midline, no masses. Cardiovascular: No clubbing, cyanosis, or edema. Respiratory: Normal respiratory effort, no increased work of breathing. GI: Abdomen is soft, nontender, nondistended, no abdominal masses GU: No CVA tenderness Lymph: No cervical or inguinal lymphadenopathy. Skin: No rashes, bruises or suspicious lesions. Neurologic: Grossly intact,  no focal deficits, moving all 4 extremities. Psychiatric: Normal mood and affect.  Laboratory Data: Lab Results  Component Value Date   WBC 8.9 05/19/2023   HGB 11.8 (L) 05/19/2023   HCT 37.4 (L) 05/19/2023   MCV 97.4 05/19/2023   PLT 222 05/19/2023    Lab Results  Component  Value Date   CREATININE 1.31 (H) 05/19/2023    No results found for: "PSA"  No results found for: "TESTOSTERONE"  Lab Results  Component Value Date   HGBA1C 5.8 (H) 04/22/2022    Urinalysis    Component Value Date/Time   COLORURINE AMBER (A) 05/03/2023 0453   APPEARANCEUR CLOUDY (A) 05/03/2023 0453   LABSPEC 1.014 05/03/2023 0453   PHURINE 5.0 05/03/2023 0453   GLUCOSEU NEGATIVE 05/03/2023 0453   HGBUR LARGE (A) 05/03/2023 0453   BILIRUBINUR NEGATIVE 05/03/2023 0453   KETONESUR NEGATIVE 05/03/2023 0453   PROTEINUR 100 (A) 05/03/2023 0453   UROBILINOGEN 0.2 06/20/2015 0940   NITRITE NEGATIVE 05/03/2023 0453   LEUKOCYTESUR SMALL (A) 05/03/2023 0453    Lab Results  Component Value Date   BACTERIA FEW (A) 05/03/2023    Pertinent Imaging:  Results for orders placed during the hospital encounter of 04/21/22  DG Abd 1 View  Narrative CLINICAL DATA:  Metal screening prior to MRI.  EXAM: ABDOMEN - 1 VIEW  COMPARISON:  05/23/2015.  FINDINGS: Normal bowel gas pattern. Residual contrast noted within nondilated intrarenal collecting systems and ureters, as well as the bladder.  No radiopaque foreign body.  Skeletal structures are unremarkable.  IMPRESSION: 1. No radiopaque/metallic foreign body.  No contraindication to MRI.   Electronically Signed By: Amanda Jungling M.D. On: 04/21/2022 17:19    Assessment & Plan:    1) metastatic prostate cancer - pt has transitioned care to [facility] health system. Discussed oncology referral to maintain Nubeqa and for appts closer to home. May continue ADT here or with Dr. Alita Irwin. PSA and T was sent. May go ahead with ADT up here but will  need prior auth.   2) BPH with LUTS - tamsulosin       No follow-ups on file.  Christina Coyer, MD  [facility]  [address] [place], [place] [address] [phone]

## 2024-01-06 LAB — PSA: Prostate Specific Ag, Serum: 0.6 ng/mL (ref 0.0–4.0)

## 2024-01-06 LAB — TESTOSTERONE: Testosterone: <3 ng/dL — ABNORMAL LOW (ref 264–916)

## 2024-01-07 ENCOUNTER — Ambulatory Visit (INDEPENDENT_AMBULATORY_CARE_PROVIDER_SITE_OTHER): Payer: Medicare (Managed Care)

## 2024-01-07 DIAGNOSIS — C61 Malignant neoplasm of prostate: Secondary | ICD-10-CM

## 2024-01-07 NOTE — Progress Notes (Signed)
 Eligard  SubQ Injection   Due to Prostate Cancer patient is present today for a Eligard  Injection. Order reviewed. Prior Authorization reviewed.  Medication: Eligard  6 month Dose: 45 mg  Location: right ventrogluteal  site prepped and cleaned with alcohol prior to giving injection.  Lot: [id number] Exp: 04/2025  Patient tolerated well, no complications were noted  Performed by: Gorden Latino, CMA

## 2024-01-08 NOTE — Progress Notes (Signed)
 [facility] CONSULT NOTE  Patient Care Team: Ronna Coho, MD as PCP - General (Family Medicine) Arleen Lacer, MD as PCP - Cardiology (Cardiology) Consuelo Denmark, MD as Consulting Physician (Neurology) Albert Huff, MD as Consulting Physician (Ophthalmology) Katheleen Palmer, RN as Oncology Nurse Navigator  ASSESSMENT & PLAN:  Damon Walker is a 78 y.o.male with history of ischemic cardiomyopathy EF of 30-35%, MI, CAD on plavix , HTN, being seen at [facility] for prostate cancer.  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 GG5 GS 4+5. iPSA 24.  Current diagnosis: mHSPC Germline testing: to be completed Somatic testing: not yet Treatment: ADT/Daro  The patient was counseled on the natural history of prostate cancer and the standard treatment options that are available for prostate cancer.  Discussed palliative treatment of prostate cancer.  This is treatable but not curable. Treatment can be effective for many patients.  He has been on darolutamide with ADT and tolerating well. Discussed potential side effects of treatment and long term management of CV risk factors are important. He will continue optimize CV risk factors and follow up with PCP and Cardiology. We will continue monitor his progress of prostate cancer.  Assessment & Plan Prostate cancer (HCC) ADT and darolutamide 600 mg twice daily Prescribed darolutamide Repeat PSA and lab next month. Last ADT was 01/07/24 45 mg dose Refer for genetic testing. At risk for side effect of medication Supportive baseline bone mineral density study and then every 2 years calcium  (1000-1200 mg daily from food and supplements) and vitamin D3 (1000 IU daily) Healthy lifestyle to prevent diabetes and CV disease Heart healthy diet Aggressive cardiovascular risk management exercises daily as  able Limit alcohol consumption and avoid smoking Normocytic anemia B12, folate, MMA, ferritin  Orders Placed This Encounter  Procedures   DG Bone Density    Standing Status:   Future    Expected Date:   01/26/2024    Expiration Date:   01/11/2025    Reason for Exam (SYMPTOM  OR DIAGNOSIS REQUIRED):   assess baseline bone density with prostate cancer    Preferred imaging location?:   [facility]   CBC with Differential (Cancer Center Only)    Standing Status:   Future    Number of Occurrences:   1    Expiration Date:   01/11/2025   CMP (Cancer Center only)    Standing Status:   Future    Number of Occurrences:   1    Expiration Date:   01/11/2025   Ferritin    Standing Status:   Future    Number of Occurrences:   1    Expiration Date:   01/11/2025   Folate    Standing Status:   Future    Number of Occurrences:   1    Expiration Date:   01/11/2025   Vitamin B12    Standing Status:   Future    Number of Occurrences:   1    Expiration Date:   01/11/2025   Methylmalonic acid, serum    Standing Status:   Future    Number of Occurrences:   1    Expiration Date:   02/11/2024   Iron and Iron Binding Capacity ([facility] only)    Standing Status:   Future    Number of Occurrences:   1    Expiration Date:  01/11/2025    All questions were answered. The patient knows to call the clinic with any problems, questions or concerns. No barriers to learning was detected.  Lowanda Ruddy, MD 6/9/20253:48 PM  CHIEF COMPLAINTS/PURPOSE OF CONSULTATION:  Prostate cancer  HISTORY OF PRESENTING ILLNESS:  Damon Walker 78 y.o. male is here because of prostate cancer. I have reviewed his chart and materials related to his cancer extensively and collaborated history with the patient. Summary of oncologic history is as follows: Oncology History  Prostate cancer [facility])  03/2023 Initial Diagnosis   Prostate cancer [facility])  Patient presented in August 2024 with retention and a PSA of 24. Report a  nodular prostate on exam.   He underwent TURP and prostate biopsy October 2024. 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    06/27/2023 PET scan   PSMA PET prostate gland activity, left and right seminal vesicle invasion, bilateral pelvic, retroperitoneal, mediastinal and supraclavicular lymphadenopathy. No bone lesions.    07/2023 -  Chemotherapy   Started Eligard    10/2023 -  Chemotherapy   Started darolutamide   11/2023 Tumor Marker   PSA 1.2   01/2024 Tumor Marker   PSA 0.6. T<3   01/08/2024 Cancer Staging   Staging form: Prostate, AJCC 8th Edition - Clinical: Stage IVB (cT3b, cN1, cM1a, PSA: 24, Grade Group: 5) - Signed by Lowanda Ruddy, MD on 01/08/2024 Prostate specific antigen (PSA) range: 20 or greater Histologic grading system: 5 grade system   01/12/2024 Cancer Staging   Staging form: Prostate, AJCC 8th Edition - Pathologic: Stage IVB (pT3b, pN1, cM1, PSA: 24, Grade Group: 5) - Signed by Lowanda Ruddy, MD on 01/12/2024 Prostate specific antigen (PSA) range: 20 or greater Histologic grading system: 5 grade system     MEDICAL HISTORY:  Past Medical History:  Diagnosis Date   Carotid arterial disease (HCC) 06/2014; 01/09/2015   Followed by Dr. Shirley Douglas of [facility] -- a) Carotid u/s: 40-59% bilat ICA stenosis; b) 10/2019: Carotid Dopplers October 20, 2019: R ICA 1-39%, LICA 40-59%.  Right vertebral artery appears occluded.  Normal subclavian flow.  Normal left vertebral artery. 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  MRI Brain: Acute L Occipital Lobe Infarct. Chronic microvascular ischemic changes in the white matter and left pons.  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 due to in-utero infarct;;; R Hemianopsia   Cholelithiasis with obstruction 11/2019   Cholelithiasis noted without biliary obstruction  or inflammation.   Chronic combined systolic and diastolic congestive heart failure, NYHA class 2 (HCC) 05/11/2015 - 05/15/15   a. EF 20-25% with anterior-anteroseptal akinesis (immediately post anterior STEMI).  ; b. 10/10/'16 Echo: EF 35-40% with moderate mid-apical anteroseptal, anterior and apical hypokinesis.   Coronary artery disease involving native heart 05/11/2015   Cath: 3V CAD with 90% prox RCA, 80% mid RCA, 80% OM3, (RCA and OM residual treated medically) 100% LAD - PCI to Prox-Mid LAD with Overlapping Promus Premier DES 3.0 x 38 & 3.0 x 16 (post-dilated to 3.5 mm)    Essential hypertension    GERD (gastroesophageal reflux disease)    H/O: GI bleed 05/15/2015   s/p Sigmoid Polypectomy 05/04/2015 - prior to STEMI on May 11, 2015; Had GI Bleed following TPA for CVA, while on ASA &  Brilinta ; Flex Sig - 1. Internal Hemorrhoids, 2. Distal sigmoid polypectomy site with flat Whitebase status post biopsy and tattoo with 2mL spot over 3 injections 3. Proximal sigmoid polypectomy site seen as well, 4) otherwise normal with no evidence of bleed   Hyperlipidemia with target LDL less than 70    Ischemic cardiomyopathy 05/11/2015   EF 40% on cath 05/11/2015 after anterior STEMI, EF 20-25% on echo 05/13/2015; 12/2015 =  EF ~45%.   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   Prediabetes 05/2015   A1C 6.0 in Oct 2016   ST elevation myocardial infarction (STEMI) involving left anterior descending (LAD) coronary artery with complication (HCC) 05/11/2015   100% Prox LAD - PCI with overlapping DES x 2   Stroke (HCC)    Tobacco abuse    a. 30 yrs - 1.5 ppd. - Quit 05/11/2015   Type 2 diabetes mellitus with other specified complication (HCC) 10/23/2021   Vision abnormalities    Right hemianopsia    SURGICAL HISTORY: Past Surgical History:  Procedure Laterality Date   CARDIAC CATHETERIZATION N/A 05/11/2015   Procedure: Left Heart  Cath and Coronary Angiography;  Surgeon: Peter M Swaziland, MD;  Location: [facility] INVASIVE CV LAB;  Service: Cardiovascular;  100% pLAD (long lesion). RCA - prox 90%, mid 80%. OM2 & OM3 80% (OM2 & 3 not necessarily PCI targets); EF 35-45% with Anterio HK   CARDIAC CATHETERIZATION N/A 05/11/2015   Procedure: Coronary Stent Intervention;  Surgeon: Peter M Swaziland, MD;  Location: [facility] INVASIVE CV LAB;  Service: Cardiovascular; PCI to Prox-Mid LAD with Overlapping Promus Premier DES 3.0 x 38 & 3.0 x 16 (post-dilated to 3.5 mm)     FLEXIBLE SIGMOIDOSCOPY N/A 05/18/2015   Procedure: FLEXIBLE SIGMOIDOSCOPY;  Surgeon: Ozell Blunt, MD;  Location[facility] ENDOSCOPY;  Service: Endoscopy;  Laterality: N/A;   NM MYOVIEW  LTD  08/2016   EF 30% with large anterior-anteroseptal and apical infarct consistent with LAD infarct. His HIGH RISK because of large infarct. No ischemia.   PROSTATE BIOPSY N/A 05/27/2023   Procedure: BIOPSY TRANSRECTAL ULTRASONIC PROSTATE (TUBP);  Surgeon: Christina Coyer, MD;  Location: [facility]S;  Service: Urology;  Laterality: N/A;   S/P Appendectomy     Age 80   S/P Inguinal Hernia Repair     In his 20's.   TRANSCAROTID ARTERY REVASCULARIZATION  Left 04/30/2022   Procedure: Transcarotid Artery Revascularization;  Surgeon: Dannis Dy, MD;  Location: [facility] OR;  Service: Vascular;  Laterality: Left;   TRANSTHORACIC ECHOCARDIOGRAM  05/15/2015   Mild LVH, EF 35-40% - Mod HK of mid-apical anteroseptal, anterior & apical walls.  Gr 1 DD. Mild bilateral Atrial Enlargement.   TRANSTHORACIC ECHOCARDIOGRAM  12/2015   EF up to ~40-45% wiht Anterior-Anteroapical HK.  Mod LV dilation with increased LVEDP.   TRANSTHORACIC ECHOCARDIOGRAM  12/'17; 4/'18   a) EF ~30-35% (in setting of HTN Urgency). - Gr 2 DD;; b) EF 30-35% (per Dr. Alvis Ba - 35-40%). Anterior-anteroapical Akinesis. Gr2 DD w/ high filling pressures. Mild LA dilation. Mild RV dilation.   TRANSURETHRAL RESECTION OF PROSTATE N/A 05/27/2023    Procedure: TRANSURETHRAL RESECTION OF THE PROSTATE (TURP);  Surgeon: Christina Coyer, MD;  Location: [facility];  Service: Urology;  Laterality: N/A;   ULTRASOUND GUIDANCE FOR VASCULAR ACCESS Right 04/30/2022   Procedure: ULTRASOUND GUIDANCE FOR VASCULAR ACCESS;  Surgeon: Dannis Dy, MD;  Location: [facility] OR;  Service: Vascular;  Laterality: Right;  SOCIAL HISTORY: Social History   Socioeconomic History   Marital status: Married    Spouse name: Not on file   Number of children: 3   Years of education: Not on file   Highest education level: Not on file  Occupational History   Occupation: retired from catering business  Tobacco Use   Smoking status: Former    Current packs/day: 0.00    Average packs/day: 1 pack/day for 30.0 years (30.0 ttl pk-yrs)    Types: Cigarettes    Start date: 04/05/1985    Quit date: 04/06/2015    Years since quitting: 8.7    Passive exposure: Current (Wife is a smoker)   Smokeless tobacco: Never   Tobacco comments:    Quit the day of his STEMI  Vaping Use   Vaping status: Never Used  Substance and Sexual Activity   Alcohol use: No    Alcohol/week: 0.0 standard drinks of alcohol   Drug use: No   Sexual activity: Not on file  Other Topics Concern   Not on file  Social History Narrative   Lives in [place] with wife. Originally from [place].   Social Drivers of Corporate investment banker Strain: Not on file  Food Insecurity: No Food Insecurity (01/12/2024)   Hunger Vital Sign    Worried About Running Out of Food in the Last Year: Never true    Ran Out of Food in the Last Year: Never true  Transportation Needs: No Transportation Needs (01/12/2024)   PRAPARE - Administrator, Civil Service (Medical): No    Lack of Transportation (Non-Medical): No  Physical Activity: Not on file  Stress: Not on file  Social Connections: Not on file  Intimate Partner Violence: Not At Risk (01/12/2024)   Humiliation, Afraid, Rape, and Kick questionnaire     Fear of Current or Ex-Partner: No    Emotionally Abused: No    Physically Abused: No    Sexually Abused: No    FAMILY HISTORY: Family History  Adopted: Yes  Problem Relation Age of Onset   Other Mother        eczema    Cancer Mother    Other Other        Adopted - unaware of biological parent's histories.   Stroke Neg Hx     ALLERGIES:  is allergic to latex.  MEDICATIONS:  Current Outpatient Medications  Medication Sig Dispense Refill   acetaminophen  (TYLENOL ) 325 MG tablet Take 1-2 tablets (325-650 mg total) by mouth every 4 (four) hours as needed for mild pain.     carvedilol  (COREG ) 3.125 MG tablet Take 1 tablet by mouth twice daily 180 tablet 3   clopidogrel  (PLAVIX ) 75 MG tablet Take 1 tablet (75 mg total) by mouth daily.     famotidine  (PEPCID ) 40 MG tablet Take 1 tablet (40 mg total) by mouth every other day. 15 tablet 0   ferrous sulfate  325 (65 FE) MG tablet Take 1 tablet (325 mg total) by mouth daily with breakfast. 30 tablet 0   finasteride (PROSCAR) 5 MG tablet Take 5 mg by mouth at bedtime.     furosemide  (LASIX ) 20 MG tablet TAKE 1 TABLET BY MOUTH ONCE DAILY AS NEEDED 30 tablet 8   losartan  (COZAAR ) 100 MG tablet Take 1 tablet by mouth once daily 90 tablet 3   nitroGLYCERIN  (NITROSTAT ) 0.4 MG SL tablet Place 1 tablet (0.4 mg total) under the tongue every 5 (five) minutes x 3 doses as needed for  chest pain. 25 tablet 3   NUBEQA 300 MG tablet Take 2 tablets (600 mg total) by mouth 2 (two) times daily. Take with food. 120 tablet 11   pantoprazole  (PROTONIX ) 20 MG tablet Take 1 tablet (20 mg total) by mouth every other day. 15 tablet 0   rosuvastatin  (CRESTOR ) 5 MG tablet Take 5 mg by mouth daily.     sertraline  (ZOLOFT ) 25 MG tablet Take 0.5 tablets (12.5 mg total) by mouth at bedtime. 15 tablet 0   tamsulosin  (FLOMAX ) 0.4 MG CAPS capsule Take 1 capsule (0.4 mg total) by mouth daily. 90 capsule 3   traZODone  (DESYREL ) 50 MG tablet Take 1 tablet (50 mg total) by mouth  at bedtime. 30 tablet 0   Current Facility-Administered Medications  Medication Dose Route Frequency Provider Last Rate Last Admin   leuprolide  (6 Month) (LUPRON ) injection 45 mg  45 mg Intramuscular Q6 months Christina Coyer, MD   45 mg at 01/07/24 1514    REVIEW OF SYSTEMS:   All relevant systems were reviewed with the patient and are negative.  PHYSICAL EXAMINATION: ECOG PERFORMANCE STATUS: 2 - Symptomatic, <50% confined to bed  Vitals:   01/12/24 1217  BP: (!) 109/58  Pulse: 61  Resp: 18  Temp: 97.9 F (36.6 C)  SpO2: 97%   Filed Weights   01/12/24 1217  Weight: 193 lb 11.2 oz (87.9 kg)    GENERAL: alert, no distress and comfortable SKIN: skin color is normal, LUNGS: Effort normal, no respiratory distress.  Musculoskeletal: no edema   LABORATORY DATA:  I have reviewed the results of labs.  RADIOGRAPHIC STUDIES: I have personally reviewed the radiological images as listed and agreed with the findings in the report.

## 2024-01-12 ENCOUNTER — Inpatient Hospital Stay: Payer: Medicare (Managed Care)

## 2024-01-12 ENCOUNTER — Other Ambulatory Visit: Payer: Self-pay

## 2024-01-12 ENCOUNTER — Telehealth: Payer: Self-pay

## 2024-01-12 ENCOUNTER — Other Ambulatory Visit (HOSPITAL_COMMUNITY): Payer: Self-pay

## 2024-01-12 VITALS — BP 109/58 | HR 61 | Temp 97.9°F | Resp 18 | Wt 193.7 lb

## 2024-01-12 DIAGNOSIS — D649 Anemia, unspecified: Secondary | ICD-10-CM

## 2024-01-12 DIAGNOSIS — Z9079 Acquired absence of other genital organ(s): Secondary | ICD-10-CM | POA: Insufficient documentation

## 2024-01-12 DIAGNOSIS — Z7902 Long term (current) use of antithrombotics/antiplatelets: Secondary | ICD-10-CM | POA: Diagnosis not present

## 2024-01-12 DIAGNOSIS — I252 Old myocardial infarction: Secondary | ICD-10-CM | POA: Diagnosis not present

## 2024-01-12 DIAGNOSIS — Z79899 Other long term (current) drug therapy: Secondary | ICD-10-CM | POA: Insufficient documentation

## 2024-01-12 DIAGNOSIS — C61 Malignant neoplasm of prostate: Secondary | ICD-10-CM

## 2024-01-12 DIAGNOSIS — I5042 Chronic combined systolic (congestive) and diastolic (congestive) heart failure: Secondary | ICD-10-CM | POA: Insufficient documentation

## 2024-01-12 DIAGNOSIS — Z9189 Other specified personal risk factors, not elsewhere classified: Secondary | ICD-10-CM | POA: Insufficient documentation

## 2024-01-12 DIAGNOSIS — I110 Hypertensive heart disease with heart failure: Secondary | ICD-10-CM | POA: Diagnosis not present

## 2024-01-12 DIAGNOSIS — Z87891 Personal history of nicotine dependence: Secondary | ICD-10-CM | POA: Insufficient documentation

## 2024-01-12 LAB — CBC WITH DIFFERENTIAL (CANCER CENTER ONLY)
Abs Immature Granulocytes: 0.02 10*3/uL (ref 0.00–0.07)
Basophils Absolute: 0.1 10*3/uL (ref 0.0–0.1)
Basophils Relative: 1 %
Eosinophils Absolute: 0.4 10*3/uL (ref 0.0–0.5)
Eosinophils Relative: 5 %
HCT: 38.8 % — ABNORMAL LOW (ref 39.0–52.0)
Hemoglobin: 12.8 g/dL — ABNORMAL LOW (ref 13.0–17.0)
Immature Granulocytes: 0 %
Lymphocytes Relative: 29 %
Lymphs Abs: 2.5 10*3/uL (ref 0.7–4.0)
MCH: 30.6 pg (ref 26.0–34.0)
MCHC: 33 g/dL (ref 30.0–36.0)
MCV: 92.8 fL (ref 80.0–100.0)
Monocytes Absolute: 0.7 10*3/uL (ref 0.1–1.0)
Monocytes Relative: 8 %
Neutro Abs: 5.1 10*3/uL (ref 1.7–7.7)
Neutrophils Relative %: 57 %
Platelet Count: 231 10*3/uL (ref 150–400)
RBC: 4.18 MIL/uL — ABNORMAL LOW (ref 4.22–5.81)
RDW: 12.9 % (ref 11.5–15.5)
WBC Count: 8.8 10*3/uL (ref 4.0–10.5)
nRBC: 0 % (ref 0.0–0.2)

## 2024-01-12 LAB — CMP (CANCER CENTER ONLY)
ALT: 15 U/L (ref 0–44)
AST: 16 U/L (ref 15–41)
Albumin: 4.3 g/dL (ref 3.5–5.0)
Alkaline Phosphatase: 53 U/L (ref 38–126)
Anion gap: 9 (ref 5–15)
BUN: 31 mg/dL — ABNORMAL HIGH (ref 8–23)
CO2: 29 mmol/L (ref 22–32)
Calcium: 10.2 mg/dL (ref 8.9–10.3)
Chloride: 108 mmol/L (ref 98–111)
Creatinine: 1.45 mg/dL — ABNORMAL HIGH (ref 0.61–1.24)
GFR, Estimated: 49 mL/min — ABNORMAL LOW (ref >60)
Glucose, Bld: 124 mg/dL — ABNORMAL HIGH (ref 70–99)
Potassium: 4.6 mmol/L (ref 3.5–5.1)
Sodium: 146 mmol/L — ABNORMAL HIGH (ref 135–145)
Total Bilirubin: 0.9 mg/dL (ref 0.0–1.2)
Total Protein: 7.7 g/dL (ref 6.5–8.1)

## 2024-01-12 LAB — VITAMIN B12: Vitamin B-12: 706 pg/mL (ref 180–914)

## 2024-01-12 LAB — FERRITIN: Ferritin: 112 ng/mL (ref 24–336)

## 2024-01-12 LAB — FOLATE: Folate: 20.6 ng/mL (ref >5.9)

## 2024-01-12 LAB — IRON AND IRON BINDING CAPACITY (CC-WL,HP ONLY)
Iron: 77 ug/dL (ref 45–182)
Saturation Ratios: 18 % (ref 17.9–39.5)
TIBC: 434 ug/dL (ref 250–450)
UIBC: 357 ug/dL (ref 117–376)

## 2024-01-12 MED ORDER — NUBEQA 300 MG PO TABS
600.0000 mg | ORAL_TABLET | Freq: Two times a day (BID) | ORAL | 11 refills | Status: DC
Start: 2024-01-12 — End: 2024-05-18
  Filled 2024-01-12: qty 120, 30d supply, fill #0
  Filled 2024-02-04: qty 120, 30d supply, fill #1
  Filled 2024-03-04: qty 120, 30d supply, fill #2
  Filled 2024-03-30: qty 120, 30d supply, fill #3
  Filled 2024-04-29: qty 120, 30d supply, fill #4
  Filled ????-??-??: fill #1

## 2024-01-12 MED ORDER — NUBEQA 300 MG PO TABS: 600.0000 mg | ORAL_TABLET | Freq: Two times a day (BID) | ORAL | 11 refills | Status: DC

## 2024-01-12 NOTE — Progress Notes (Signed)
 Specialty Pharmacy Initial Fill Coordination Note  Damon Walker is a 78 y.o. male contacted today regarding initial fill of specialty medication(s) Darolutamide [name])  Patient requested [name] at [facility] at [place] date: 01/13/24  Medication will be filled on 01/12/24.   Patient is aware of $0.00 copayment.    Hansel Ley, CPhT Supervisor Pharmacy Patient Advocate [facility] [phone] (Ph) 01/12/2024 3:24 PM

## 2024-01-12 NOTE — Assessment & Plan Note (Addendum)
 Supportive baseline bone mineral density study and then every 2 years calcium  (1000-1200 mg daily from food and supplements) and vitamin D3 (1000 IU daily) Healthy lifestyle to prevent diabetes and CV disease Heart healthy diet Aggressive cardiovascular risk management exercises daily as able Limit alcohol consumption and avoid smoking

## 2024-01-12 NOTE — Telephone Encounter (Signed)
 Oral Oncology Patient Advocate Encounter  **Patient is transitioning care to [facility]. Need to set patient up to fill through [facility].**  After completing a benefits investigation, prior authorization for Nubeqa is not required at this time through [employer] Medicare Part D (patient has active Prior Auth on file through 10/02/24).  Patient's copay is $0.00.     Hansel Ley, CPhT Supervisor Pharmacy Patient Advocate [facility] [phone] (Ph) 01/12/2024 1:41 PM

## 2024-01-12 NOTE — Progress Notes (Signed)
 Patient counseled on Nubeqa in telephone encounter opened on 01/12/24. Patient has received 4 months of therapy already from urology.   Haja Crego, PharmD Hematology/Oncology Clinical Pharmacist [facility] [phone]

## 2024-01-12 NOTE — Telephone Encounter (Addendum)
 Called and left VM for patient to return my call to set up first fill and pickup/ delivery of medication through [facility]. Oral Oncology Clinic will continue to reach out to patient until first fill and pickup/ delivery is scheduled and confirmed.   Call 1 - LVM 01/12/24 (Home number and Wife's number)   Hansel Ley, CPhT Supervisor Pharmacy Patient Advocate [facility] [phone] (Ph) 01/12/2024 1:47 PM

## 2024-01-12 NOTE — Patient Instructions (Addendum)
 We discussed stage 4 prostate cancer with spread to other parts of the body. This is treatable but not curable. Treatment can be effective for many patients. You PSA is coming down nicely. Now at 0.6.   Continue darolutamide and new script has been ordered. Return in July and labs a few days before next visit.  Other measures: baseline bone mineral density study calcium  (1000-1200 mg daily from food and supplements) and vitamin D3 (1000 IU daily) Healthy lifestyle to prevent diabetes and CV disease Heart healthy diet Aggressive cardiovascular risk management exercises daily as able Limit alcohol consumption and avoid smoking

## 2024-01-12 NOTE — Telephone Encounter (Signed)
 Oral Oncology Pharmacist Encounter  Received new prescription for Nubeqa (darolutamide) for the treatment of metastatic hormone sensitive prostate cancer in conjunction with ADT, planned duration until disease progression or unacceptable toxicity. Patient is continuing therapy and received last months prescription for Urology.   Labs are pending. Prescription modified for patient to take Nubeqa with food. Prescription dose and frequency assessed for appropriateness.   Current medication list in Epic reviewed, DDIs with Nubeqa identified: - Rosuvastatin : Patient is currently on maximum dose of Crestor  while being on Nubeqa. No interventions needed.   Evaluated chart and no patient barriers to medication adherence noted.   Prescription has been e-scribed to the [facility] for benefits analysis and approval.  Oral Oncology Clinic will continue to follow for insurance authorization, copayment issues, initial counseling and start date.  Damon Walker, PharmD Hematology/Oncology Clinical Pharmacist [facility] Oral Chemotherapy Navigation Clinic [phone] 01/12/2024 1:52 PM

## 2024-01-12 NOTE — Progress Notes (Signed)
 PATIENT NAVIGATOR PROGRESS NOTE  Name: Damon Walker Date: 01/12/2024 MRN: [id number]  DOB: Jun 27, 1946   Reason for visit:  Transitioning care for his metastatic prostate cancer.  Plan of care: Continue darolutamide with ADT Genetic Testing (Referral placed) Bone Density

## 2024-01-12 NOTE — Telephone Encounter (Addendum)
 Oral Chemotherapy Pharmacist Encounter   Patient Education I spoke with patient for overview of new oral chemotherapy medication: Nubeqa (darolutamide) for the treatment of metastatic hormone sensitive prostate cancer in conjunction with ADT, planned duration for Nubeqa until disease progression or unacceptable drug toxicity.   Pt is doing well. Counseled patient on administration, dosing, side effects, monitoring, drug-food interactions, safe handling, storage, and disposal.   Patient will take Nubeqa 300 mg tablet, 2 tablets (600 mg total) by mouth 2 (two) times daily with a meal. Patient knows to avoid grapefruit and grapefruit juice while on Nubeqa.   Start date: continuation - patient has been on therapy   Side effects include but not limited to: changes in LFTs, HTN, fatigue.  Patients labs reviewed from 01/12/24, no interventions needed. Patients creatinine has increased slightly, discussed with patients wife to drink more water . Patients creatinine clearance does not require any dose adjustments.    Reviewed with patient importance of keeping a medication schedule and plan for any missed doses.After discussion with patient no patient barriers to medication adherence identified.    Patient voiced understanding and appreciation. All questions answered. Medication handout provided.   Provided patient with Oral Chemotherapy Navigation Clinic phone number. Patient knows to call the office with questions or concerns.  Letty Salvi, PharmD Hematology/Oncology Clinical Pharmacist [facility] [phone] 01/12/2024 3:12 PM

## 2024-01-12 NOTE — Telephone Encounter (Signed)
 Patient successfully OnBoarded and drug education provided by pharmacist. Medication scheduled to be picked up on Tuesday, 01/13/24, from [facility]. Patient also knows to call me at [phone] with any questions or concerns regarding receiving medication or if there is any unexpected change in co-pay.    Hansel Ley, CPhT Supervisor Pharmacy Patient Advocate [facility] [phone] (Ph) 01/12/2024 3:25 PM

## 2024-01-12 NOTE — Assessment & Plan Note (Addendum)
 ADT and darolutamide 600 mg twice daily Prescribed darolutamide Repeat PSA and lab next month. Last ADT was 01/07/24 45 mg dose Refer for genetic testing.

## 2024-01-13 ENCOUNTER — Other Ambulatory Visit: Payer: Self-pay

## 2024-01-13 ENCOUNTER — Other Ambulatory Visit (HOSPITAL_COMMUNITY): Payer: Self-pay

## 2024-01-14 LAB — METHYLMALONIC ACID, SERUM: Methylmalonic Acid, Quantitative: 340 nmol/L (ref 0–378)

## 2024-01-15 NOTE — Progress Notes (Signed)
 Patient remains on Nubeqa and received new Rx on 6/10.  Patient's wife was provided central scheduling contact number to schedule bone density per request due to her work schedule.   Patient is established with a treatment plan and is actively engaged in care. Nurse Navigator services not currently indicated at this time. Will re-evaluate if needs change or if additional support is requested.

## 2024-01-16 ENCOUNTER — Ambulatory Visit: Payer: Self-pay

## 2024-01-23 NOTE — Progress Notes (Signed)
 Returned call to pt's wife regarding pt having back and shoulder pain. Wife states at this time pain is better but pt has been having pain on and off for a week and has not had this pain before. Pt denies need for intervention at this time. Wife states they will see PCP if it comes back.

## 2024-01-28 ENCOUNTER — Other Ambulatory Visit: Payer: Self-pay

## 2024-01-30 ENCOUNTER — Other Ambulatory Visit: Payer: Self-pay

## 2024-02-04 ENCOUNTER — Encounter (HOSPITAL_COMMUNITY): Payer: Self-pay | Admitting: Emergency Medicine

## 2024-02-04 ENCOUNTER — Emergency Department (HOSPITAL_COMMUNITY)
Admission: EM | Admit: 2024-02-04 | Discharge: 2024-02-04 | Disposition: A | Discharge status: Home / Self Care | Payer: Medicare (Managed Care)

## 2024-02-04 ENCOUNTER — Other Ambulatory Visit: Payer: Self-pay

## 2024-02-04 ENCOUNTER — Other Ambulatory Visit (HOSPITAL_COMMUNITY): Payer: Self-pay

## 2024-02-04 ENCOUNTER — Emergency Department (HOSPITAL_COMMUNITY): Payer: Medicare (Managed Care)

## 2024-02-04 DIAGNOSIS — Z91040 Latex allergy status: Secondary | ICD-10-CM | POA: Insufficient documentation

## 2024-02-04 DIAGNOSIS — Z8546 Personal history of malignant neoplasm of prostate: Secondary | ICD-10-CM | POA: Diagnosis not present

## 2024-02-04 DIAGNOSIS — I509 Heart failure, unspecified: Secondary | ICD-10-CM | POA: Diagnosis not present

## 2024-02-04 DIAGNOSIS — I110 Hypertensive heart disease with heart failure: Secondary | ICD-10-CM | POA: Diagnosis not present

## 2024-02-04 DIAGNOSIS — Z79899 Other long term (current) drug therapy: Secondary | ICD-10-CM | POA: Insufficient documentation

## 2024-02-04 DIAGNOSIS — M546 Pain in thoracic spine: Secondary | ICD-10-CM | POA: Insufficient documentation

## 2024-02-04 DIAGNOSIS — I2510 Atherosclerotic heart disease of native coronary artery without angina pectoris: Secondary | ICD-10-CM | POA: Diagnosis not present

## 2024-02-04 LAB — BASIC METABOLIC PANEL WITH GFR
Anion gap: 11 (ref 5–15)
BUN: 25 mg/dL — ABNORMAL HIGH (ref 8–23)
CO2: 22 mmol/L (ref 22–32)
Calcium: 10 mg/dL (ref 8.9–10.3)
Chloride: 106 mmol/L (ref 98–111)
Creatinine, Ser: 1.25 mg/dL — ABNORMAL HIGH (ref 0.61–1.24)
GFR, Estimated: 59 mL/min — ABNORMAL LOW (ref >60)
Glucose, Bld: 97 mg/dL (ref 70–99)
Potassium: 4.5 mmol/L (ref 3.5–5.1)
Sodium: 139 mmol/L (ref 135–145)

## 2024-02-04 LAB — CBC WITH DIFFERENTIAL/PLATELET
Abs Immature Granulocytes: 0.02 10*3/uL (ref 0.00–0.07)
Basophils Absolute: 0.1 10*3/uL (ref 0.0–0.1)
Basophils Relative: 1 %
Eosinophils Absolute: 0.6 10*3/uL — ABNORMAL HIGH (ref 0.0–0.5)
Eosinophils Relative: 7 %
HCT: 39.3 % (ref 39.0–52.0)
Hemoglobin: 13 g/dL (ref 13.0–17.0)
Immature Granulocytes: 0 %
Lymphocytes Relative: 25 %
Lymphs Abs: 2.3 10*3/uL (ref 0.7–4.0)
MCH: 31.2 pg (ref 26.0–34.0)
MCHC: 33.1 g/dL (ref 30.0–36.0)
MCV: 94.2 fL (ref 80.0–100.0)
Monocytes Absolute: 0.8 10*3/uL (ref 0.1–1.0)
Monocytes Relative: 9 %
Neutro Abs: 5.3 10*3/uL (ref 1.7–7.7)
Neutrophils Relative %: 58 %
Platelets: 247 10*3/uL (ref 150–400)
RBC: 4.17 MIL/uL — ABNORMAL LOW (ref 4.22–5.81)
RDW: 13.1 % (ref 11.5–15.5)
WBC: 9.1 10*3/uL (ref 4.0–10.5)
nRBC: 0 % (ref 0.0–0.2)

## 2024-02-04 MED ORDER — DICLOFENAC SODIUM 1 % EX GEL: 2.0000 g | Freq: Four times a day (QID) | CUTANEOUS | 0 refills | Status: AC

## 2024-02-04 MED ORDER — ONDANSETRON HCL 4 MG/2ML IJ SOLN
4.0000 mg | Freq: Once | INTRAMUSCULAR | Status: AC
  Administered 2024-02-04: 4 mg via INTRAVENOUS
  Filled 2024-02-04: qty 2

## 2024-02-04 MED ORDER — MORPHINE SULFATE (PF) 4 MG/ML IV SOLN
4.0000 mg | Freq: Once | INTRAVENOUS | Status: AC
  Administered 2024-02-04: 4 mg via INTRAVENOUS
  Filled 2024-02-04: qty 1

## 2024-02-04 MED ORDER — OXYCODONE-ACETAMINOPHEN 5-325 MG PO TABS: 1.0000 | ORAL_TABLET | Freq: Four times a day (QID) | ORAL | 0 refills | Status: AC | PRN

## 2024-02-04 MED ORDER — LIDOCAINE 5 % EX PTCH: 1.0000 | MEDICATED_PATCH | CUTANEOUS | 0 refills | Status: AC

## 2024-02-04 NOTE — ED Triage Notes (Signed)
 Pt comes in with family for upper back pain that has been ongoing for a little while but has gotten worse today.

## 2024-02-04 NOTE — Discharge Instructions (Signed)
 Your workup today was reassuring.  Please use the Voltaren gel and lidocaine  patches.  If you are still having pain it is okay to take the Percocet.  Do not drive or drink alcohol while taking the Percocet as may make you drowsy.  You may also try Tylenol  650 mg every 6-8 hours in addition to the Voltaren gel and lidocaine  patches.  If you do this and need to take the Percocet be careful not to take more than 4000 mg of Tylenol /acetaminophen  in 24 hours as Percocet does have Tylenol  in it.  Please follow-up with your doctor.  Return to the ER for fevers, bowel or bladder dysfunction, weakness, or other concerning symptoms.

## 2024-02-04 NOTE — Progress Notes (Signed)
 Specialty Pharmacy Refill Coordination Note  Spoke with Martell,Valerie (Wife)  Issaic Welliver is a 77 y.o. male contacted today regarding refills of specialty medication(s) Darolutamide  (Nubeqa )  Patient requested: [name] at [facility] at [place] date: 02/09/24  Medication will be filled on 02/05/24.

## 2024-02-04 NOTE — ED Provider Notes (Signed)
 [place] EMERGENCY DEPARTMENT AT [facility] Provider Note   CSN: [id number] Arrival date & time: 02/04/24  1545     Patient presents with: Back Pain   Damon Walker is a 78 y.o. male.   78 year old male with past medical history of prostate cancer, hypertension, hyperlipidemia, coronary artery disease, and CHF presenting to the emergency department today with thoracic back pain.  The patient states that this been going on now for the past few weeks.  States that it has gradually gotten worse.  The patient denies any bowel or bladder dysfunction and has not had any saddle anesthesia.  He denies any fevers.  Denies any recent injuries.  He came to the ER today for further evaluation due to ongoing symptoms.  He does not normally take anything for pain at home.  He denies any weakness, numbness, or tingling.   Back Pain      Prior to Admission medications   Medication Sig Start Date End Date Taking? Authorizing Provider  diclofenac Sodium (VOLTAREN) 1 % GEL Apply 2 g topically 4 (four) times daily. 02/04/24  Yes Ula Prentice SAUNDERS, MD  lidocaine  (LIDODERM ) 5 % Place 1 patch onto the skin daily. Remove & Discard patch within 12 hours or as directed by MD 02/04/24  Yes Ula Prentice SAUNDERS, MD  oxyCODONE -acetaminophen  (PERCOCET/ROXICET) 5-325 MG tablet Take 1 tablet by mouth every 6 (six) hours as needed for severe pain (pain score 7-10). 02/04/24  Yes Ula Prentice SAUNDERS, MD  carvedilol  (COREG ) 3.125 MG tablet Take 1 tablet by mouth twice daily 11/04/23   Anner Alm ORN, MD  clopidogrel  (PLAVIX ) 75 MG tablet Take 1 tablet (75 mg total) by mouth daily. 05/29/23   Nieves Cough, MD  famotidine  (PEPCID ) 40 MG tablet Take 1 tablet (40 mg total) by mouth every other day. 05/07/22   Love, Sharlet RAMAN, PA-C  ferrous sulfate  325 (65 FE) MG tablet Take 1 tablet (325 mg total) by mouth daily with breakfast. 05/07/22   Love, Sharlet RAMAN, PA-C  finasteride (PROSCAR) 5 MG tablet Take 5 mg by mouth at bedtime.  03/20/23   [provider]  furosemide  (LASIX ) 20 MG tablet TAKE 1 TABLET BY MOUTH ONCE DAILY AS NEEDED 02/25/23   Anner Alm ORN, MD  losartan  (COZAAR ) 100 MG tablet Take 1 tablet by mouth once daily 09/25/23   Anner Alm ORN, MD  nitroGLYCERIN  (NITROSTAT ) 0.4 MG SL tablet Place 1 tablet (0.4 mg total) under the tongue every 5 (five) minutes x 3 doses as needed for chest pain. 03/05/23   Lambert, Jennifer K, PA-C  NUBEQA  300 MG tablet Take 2 tablets (600 mg total) by mouth 2 (two) times daily. Take with food. 01/12/24   Tina Pauletta BROCKS, MD  pantoprazole  (PROTONIX ) 20 MG tablet Take 1 tablet (20 mg total) by mouth every other day. 05/07/22   Love, Sharlet RAMAN, PA-C  rosuvastatin  (CRESTOR ) 5 MG tablet Take 5 mg by mouth daily.    [provider]  sertraline  (ZOLOFT ) 25 MG tablet Take 0.5 tablets (12.5 mg total) by mouth at bedtime. 05/07/22   Love, Sharlet RAMAN, PA-C  tamsulosin  (FLOMAX ) 0.4 MG CAPS capsule Take 1 capsule (0.4 mg total) by mouth daily. 01/05/24   Nieves Cough, MD  traZODone  (DESYREL ) 50 MG tablet Take 1 tablet (50 mg total) by mouth at bedtime. 05/07/22   Love, Sharlet RAMAN, PA-C    Allergies: Latex    Review of Systems  Musculoskeletal:  Positive for back pain.  All other systems reviewed and are negative.   Updated Vital Signs BP 139/66   Pulse (!) 57   Temp 99.1 F (37.3 C) (Oral)   Resp 18   Ht 5' 7 (1.702 m)   Wt 86.2 kg   SpO2 94%   BMI 29.76 kg/m   Physical Exam Vitals and nursing note reviewed.   Gen: NAD Eyes: PERRL, EOMI HEENT: no oropharyngeal swelling Neck: trachea midline Resp: clear to auscultation bilaterally Card: RRR, no murmurs, rubs, or gallops Abd: nontender, nondistended Extremities: no calf tenderness, no edema Vascular: 2+ radial pulses bilaterally, 2+ DP pulses bilaterally MSK: Tender over the upper and mid thoracic lumbar spine with no step-offs or deformities noted Neuro: Equal strength and sensation throughout bilateral  lower extremities with normal patellar and Achilles reflexes bilaterally Skin: no rashes Psyc: acting appropriately   (all labs ordered are listed, but only abnormal results are displayed) Labs Reviewed  CBC WITH DIFFERENTIAL/PLATELET - Abnormal; Notable for the following components:      Result Value   RBC 4.17 (*)    Eosinophils Absolute 0.6 (*)    All other components within normal limits  BASIC METABOLIC PANEL WITH GFR - Abnormal; Notable for the following components:   BUN 25 (*)    Creatinine, Ser 1.25 (*)    GFR, Estimated 59 (*)    All other components within normal limits    EKG: None  Radiology: CT Thoracic Spine Wo Contrast Result Date: 02/04/2024 CLINICAL DATA:  Metastatic disease evaluation EXAM: CT THORACIC SPINE WITHOUT CONTRAST TECHNIQUE: Multidetector CT images of the thoracic were obtained using the standard protocol without intravenous contrast. RADIATION DOSE REDUCTION: This exam was performed according to the departmental dose-optimization program which includes automated exposure control, adjustment of the mA and/or kV according to patient size and/or use of iterative reconstruction technique. COMPARISON:  PET-CT fusion study dated June 27, 2023. FINDINGS: Alignment: Anatomic. Vertebrae: Intact.  No osseous lesions are present. Paraspinal and other soft tissues: Unremarkable. Mild calcified pleural plaques present bilaterally. Disc levels: The disc spaces are satisfactory preserved and the spinal canal and neural foramina appear patent throughout the thoracic spine. IMPRESSION: 1. Mild diffuse degenerative disc disease without evidence of disc herniation, spinal canal or neural foraminal stenosis. No evidence of osseous metastatic disease. Electronically Signed   By: Evalene Coho M.D.   On: 02/04/2024 17:45     Procedures   Medications Ordered in the ED  morphine (PF) 4 MG/ML injection 4 mg (4 mg Intravenous Given 02/04/24 1639)  ondansetron  (ZOFRAN )  injection 4 mg (4 mg Intravenous Given 02/04/24 1640)                                    Medical Decision Making 78 year old male with past medical history of stage IV prostate cancer, coronary artery disease, hypertension, and hyperlipidemia presenting to the emergency department today with back pain.  I will further evaluate the patient here with basic labs as well as a CT scan of his thoracic spine to evaluate for lytic or blastic lesions as well as pathologic fracture.  The patient is neurovascularly intact.  Will give morphine Zofran  and reevaluate for ultimate disposition.  He does not have any red flag symptoms for cauda equina syndrome or cord compressing lesion at this time.  The patient is more comfortable on reevaluation.  His work appears reassuring.  He is discharged with return precautions.  NSAIDs systemically are avoided due to the patient's chronic kidney disease.  Amount and/or Complexity of Data Reviewed Labs: ordered. Radiology: ordered.  Risk Prescription drug management.        Final diagnoses:  Acute thoracic back pain, unspecified back pain laterality    ED Discharge Orders          Ordered    diclofenac Sodium (VOLTAREN) 1 % GEL  4 times daily        02/04/24 1840    lidocaine  (LIDODERM ) 5 %  Every 24 hours        02/04/24 1840    oxyCODONE -acetaminophen  (PERCOCET/ROXICET) 5-325 MG tablet  Every 6 hours PRN        02/04/24 1841               Ula Prentice SAUNDERS, MD 02/04/24 1842

## 2024-02-05 ENCOUNTER — Other Ambulatory Visit: Payer: Self-pay

## 2024-02-09 ENCOUNTER — Other Ambulatory Visit: Payer: Self-pay

## 2024-02-09 ENCOUNTER — Other Ambulatory Visit (HOSPITAL_COMMUNITY): Payer: Self-pay

## 2024-02-09 NOTE — Progress Notes (Signed)
 Spoke to patient's wife, medication was suppose to have been ready today. Medication was not in stock at the time. Wife stated that patient had 2-3 days left of medication. I told wife we would have it ready for pick up by tomorrow (7/8) afternoon. Wife was understanding.

## 2024-02-10 ENCOUNTER — Other Ambulatory Visit (HOSPITAL_COMMUNITY): Payer: Self-pay

## 2024-02-10 ENCOUNTER — Other Ambulatory Visit: Payer: Self-pay

## 2024-02-12 ENCOUNTER — Telehealth: Payer: Self-pay

## 2024-02-12 NOTE — Telephone Encounter (Signed)
 I informed Damon Walker that his lab appointment on 7/21 changed by 15 minutes from 10am to 10:15am.

## 2024-02-20 ENCOUNTER — Other Ambulatory Visit: Payer: Self-pay

## 2024-02-20 DIAGNOSIS — C61 Malignant neoplasm of prostate: Secondary | ICD-10-CM

## 2024-02-22 NOTE — Assessment & Plan Note (Signed)
 Supportive baseline bone mineral density calcium  (1000-1200 mg daily from food and supplements) and vitamin D3 (1000 IU daily) Healthy lifestyle to prevent diabetes and CV disease Heart healthy diet Aggressive cardiovascular risk management exercises daily as able Limit alcohol consumption and avoid smoking

## 2024-02-22 NOTE — Assessment & Plan Note (Addendum)
 Continue ADT, last Eligard  on 01/07/2024. Will see if can switch to Orgovyx in Dec. Continue darolutamide  600 mg twice daily Repeat PSA and lab next month. Last ADT was 01/07/24 45 mg dose Refer for genetic testing.

## 2024-02-22 NOTE — Progress Notes (Unsigned)
 [facility] OFFICE PROGRESS NOTE  Patient Care Team: Kip Righter, MD as PCP - General (Family Medicine) Anner Alm ORN, MD as PCP - Cardiology (Cardiology) Jerri Pfeiffer, MD as Consulting Physician (Neurology) Roz Anes, MD as Consulting Physician (Ophthalmology) Vertell Pont, RN as Oncology Nurse Navigator  Ladd is a 78 y.o.male with history of ischemic cardiomyopathy EF of 30-35%, MI, CAD on plavix , HTN, [facility].   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 GG5 GS 4+5. iPSA 24.   Current diagnosis: mHSPC Germline testing: to be completed. Scheduled for next month Somatic testing: not yet Treatment: ADT(07/2023) Clement (10/2023)  PSA down to 0.3.  Baseline hard to walk due to chronic hip pain more on the right. Recommend talk to PCP this coming week on his visit.  Patient drives to [place] just for injection will order on next visit. Assessment & Plan Prostate cancer [facility]) Continue ADT, last Eligard  on 01/07/2024. Will see if can switch to Orgovyx in Dec. Continue darolutamide  600 mg twice daily Repeat PSA and lab next month. Last ADT was 01/07/24 45 mg dose Refer for genetic testing. At risk for side effect of medication Supportive baseline bone mineral density calcium  (1000-1200 mg daily from food and supplements) and vitamin D3 (1000 IU daily) Healthy lifestyle to prevent diabetes and CV disease Heart healthy diet Aggressive cardiovascular risk management exercises daily as able Limit alcohol consumption and avoid smoking Coronary artery disease involving native coronary artery of native heart without angina pectoris Asymptomatic Continue heart healthy diet.  Follow up with cardiology Chronic combined systolic and diastolic heart failure (HCC) Last EF 30-35% HF was 0.9%  from ARANOTE trial. Same as placebo Benign prostatic hyperplasia with urinary retention Continue finasteride  5 mg nightly Continue tamsulosin  0.5 mg nightly Refilled both today  Orders Placed This Encounter  Procedures   CBC with Differential (Cancer Center Only)    Standing Status:   Future    Expiration Date:   02/23/2025   CMP (Cancer Center only)    Standing Status:   Future    Expiration Date:   02/23/2025   Testosterone     Standing Status:   Future    Expiration Date:   02/23/2025   Prostate-Specific AG, Serum    Standing Status:   Future    Expiration Date:   02/23/2025     [name], MD  INTERVAL HISTORY: Patient returns for follow-up. No chest pain, short of breath, edema.  No trouble urinating.  Report of back pain and in ED. CT without metastases.  Oncology History  Prostate cancer [facility])  03/2023 Initial Diagnosis   Prostate cancer [facility])  Patient presented in August 2024 with retention and a PSA of 24. Report a nodular prostate on exam.   He underwent TURP and prostate biopsy October 2024. 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    06/27/2023 PET scan   PSMA PET prostate gland activity, left and right seminal vesicle invasion, bilateral pelvic, retroperitoneal, mediastinal and supraclavicular lymphadenopathy. No bone lesions.    07/2023 -  Chemotherapy   Started Eligard    10/2023 -  Chemotherapy   Started darolutamide    11/2023 Tumor Marker  PSA 1.2   01/2024 Tumor Marker   PSA 0.6. T<3   01/08/2024 Cancer Staging   Staging form: Prostate, AJCC 8th Edition - Clinical: Stage IVB (cT3b, cN1, cM1a, PSA: 24, Grade Group: 5) - Signed by Tina Pauletta BROCKS, MD on 01/08/2024 Prostate specific antigen (PSA) range: 20 or greater Histologic grading system: 5 grade system   01/12/2024 Cancer Staging   Staging form: Prostate, AJCC 8th Edition - Pathologic: Stage IVB (pT3b,  pN1, cM1, PSA: 24, Grade Group: 5) - Signed by Tina Pauletta BROCKS, MD on 01/12/2024 Prostate specific antigen (PSA) range: 20 or greater Histologic grading system: 5 grade system   02/23/2024 Tumor Marker   PSA 0.3      PHYSICAL EXAMINATION: ECOG PERFORMANCE STATUS: 2 - Symptomatic, <50% confined to bed  Vitals:   02/24/24 0840  BP: (!) 115/56  Pulse: 74  Resp: 18  Temp: 97.6 F (36.4 C)  SpO2: 99%   Filed Weights   02/24/24 0840  Weight: 192 lb 9 oz (87.3 kg)   GENERAL: alert, no distress and comfortable LUNGS: clear to auscultation and percussion with normal breathing effort HEART: regular rate & rhythm    Relevant data reviewed during this visit included labs and imaging.

## 2024-02-23 ENCOUNTER — Inpatient Hospital Stay: Payer: Medicare (Managed Care)

## 2024-02-23 DIAGNOSIS — C61 Malignant neoplasm of prostate: Secondary | ICD-10-CM | POA: Diagnosis present

## 2024-02-23 DIAGNOSIS — I2510 Atherosclerotic heart disease of native coronary artery without angina pectoris: Secondary | ICD-10-CM | POA: Insufficient documentation

## 2024-02-23 LAB — CBC WITH DIFFERENTIAL (CANCER CENTER ONLY)
Abs Immature Granulocytes: 0.03 K/uL (ref 0.00–0.07)
Basophils Absolute: 0.1 K/uL (ref 0.0–0.1)
Basophils Relative: 1 %
Eosinophils Absolute: 0.7 K/uL — ABNORMAL HIGH (ref 0.0–0.5)
Eosinophils Relative: 7 %
HCT: 36.4 % — ABNORMAL LOW (ref 39.0–52.0)
Hemoglobin: 12.3 g/dL — ABNORMAL LOW (ref 13.0–17.0)
Immature Granulocytes: 0 %
Lymphocytes Relative: 24 %
Lymphs Abs: 2.2 K/uL (ref 0.7–4.0)
MCH: 31 pg (ref 26.0–34.0)
MCHC: 33.8 g/dL (ref 30.0–36.0)
MCV: 91.7 fL (ref 80.0–100.0)
Monocytes Absolute: 0.7 K/uL (ref 0.1–1.0)
Monocytes Relative: 7 %
Neutro Abs: 5.5 K/uL (ref 1.7–7.7)
Neutrophils Relative %: 61 %
Platelet Count: 229 K/uL (ref 150–400)
RBC: 3.97 MIL/uL — ABNORMAL LOW (ref 4.22–5.81)
RDW: 12.6 % (ref 11.5–15.5)
WBC Count: 9.1 K/uL (ref 4.0–10.5)
nRBC: 0 % (ref 0.0–0.2)

## 2024-02-23 LAB — CMP (CANCER CENTER ONLY)
ALT: 15 U/L (ref 0–44)
AST: 16 U/L (ref 15–41)
Albumin: 4 g/dL (ref 3.5–5.0)
Alkaline Phosphatase: 47 U/L (ref 38–126)
Anion gap: 6 (ref 5–15)
BUN: 30 mg/dL — ABNORMAL HIGH (ref 8–23)
CO2: 28 mmol/L (ref 22–32)
Calcium: 9.9 mg/dL (ref 8.9–10.3)
Chloride: 107 mmol/L (ref 98–111)
Creatinine: 1.36 mg/dL — ABNORMAL HIGH (ref 0.61–1.24)
GFR, Estimated: 53 mL/min — ABNORMAL LOW (ref >60)
Glucose, Bld: 105 mg/dL — ABNORMAL HIGH (ref 70–99)
Potassium: 4.6 mmol/L (ref 3.5–5.1)
Sodium: 141 mmol/L (ref 135–145)
Total Bilirubin: 1.2 mg/dL (ref 0.0–1.2)
Total Protein: 7.4 g/dL (ref 6.5–8.1)

## 2024-02-23 NOTE — Assessment & Plan Note (Addendum)
 Last EF 30-35% HF was 0.9% from ARANOTE trial. Same as placebo

## 2024-02-23 NOTE — Assessment & Plan Note (Addendum)
 Asymptomatic Continue heart healthy diet.  Follow up with cardiology

## 2024-02-24 ENCOUNTER — Encounter: Payer: Self-pay | Admitting: *Deleted

## 2024-02-24 ENCOUNTER — Inpatient Hospital Stay: Payer: Medicare (Managed Care)

## 2024-02-24 ENCOUNTER — Other Ambulatory Visit: Payer: Self-pay | Admitting: *Deleted

## 2024-02-24 VITALS — BP 115/56 | HR 74 | Temp 97.6°F | Resp 18 | Ht 67.0 in | Wt 192.6 lb

## 2024-02-24 DIAGNOSIS — Z9189 Other specified personal risk factors, not elsewhere classified: Secondary | ICD-10-CM | POA: Diagnosis not present

## 2024-02-24 DIAGNOSIS — N138 Other obstructive and reflux uropathy: Secondary | ICD-10-CM

## 2024-02-24 DIAGNOSIS — C61 Malignant neoplasm of prostate: Secondary | ICD-10-CM | POA: Diagnosis not present

## 2024-02-24 DIAGNOSIS — I2510 Atherosclerotic heart disease of native coronary artery without angina pectoris: Secondary | ICD-10-CM | POA: Diagnosis not present

## 2024-02-24 DIAGNOSIS — I5042 Chronic combined systolic (congestive) and diastolic (congestive) heart failure: Secondary | ICD-10-CM

## 2024-02-24 DIAGNOSIS — N400 Enlarged prostate without lower urinary tract symptoms: Secondary | ICD-10-CM | POA: Insufficient documentation

## 2024-02-24 DIAGNOSIS — N401 Enlarged prostate with lower urinary tract symptoms: Secondary | ICD-10-CM

## 2024-02-24 DIAGNOSIS — R338 Other retention of urine: Secondary | ICD-10-CM

## 2024-02-24 LAB — PROSTATE-SPECIFIC AG, SERUM (LABCORP): Prostate Specific Ag, Serum: 0.3 ng/mL (ref 0.0–4.0)

## 2024-02-24 LAB — TESTOSTERONE: Testosterone: <3 ng/dL — ABNORMAL LOW (ref 264–916)

## 2024-02-24 MED ORDER — TAMSULOSIN HCL 0.4 MG PO CAPS: 0.4000 mg | ORAL_CAPSULE | Freq: Every evening | ORAL | 3 refills | Status: AC

## 2024-02-24 MED ORDER — FINASTERIDE 5 MG PO TABS: 5.0000 mg | ORAL_TABLET | Freq: Every day | ORAL | 1 refills | Status: DC

## 2024-02-24 NOTE — Assessment & Plan Note (Addendum)
 Continue finasteride  5 mg nightly Continue tamsulosin  0.5 mg nightly Refilled both today

## 2024-02-24 NOTE — Progress Notes (Signed)
 Faxed bone density order to [facility] per Dr Tina

## 2024-03-04 ENCOUNTER — Other Ambulatory Visit: Payer: Self-pay

## 2024-03-04 ENCOUNTER — Other Ambulatory Visit: Payer: Self-pay | Admitting: Pharmacy Technician

## 2024-03-04 NOTE — Progress Notes (Signed)
 Specialty Pharmacy Refill Coordination Note  Damon Walker is a 78 y.o. male contacted today regarding refills of specialty medication(s) Darolutamide  (Nubeqa )  Spoke with Wife  Patient requested Pickup at [facility] at [place] date: 03/05/24   Medication will be filled on 03/04/24.

## 2024-03-30 ENCOUNTER — Other Ambulatory Visit (HOSPITAL_COMMUNITY): Payer: Self-pay

## 2024-03-31 ENCOUNTER — Inpatient Hospital Stay: Payer: Medicare (Managed Care) | Admitting: Genetic Counselor

## 2024-03-31 ENCOUNTER — Other Ambulatory Visit: Payer: Self-pay

## 2024-03-31 ENCOUNTER — Other Ambulatory Visit (HOSPITAL_COMMUNITY): Payer: Self-pay

## 2024-03-31 ENCOUNTER — Encounter: Payer: Self-pay | Admitting: Genetic Counselor

## 2024-03-31 ENCOUNTER — Inpatient Hospital Stay: Payer: Medicare (Managed Care)

## 2024-03-31 DIAGNOSIS — C61 Malignant neoplasm of prostate: Secondary | ICD-10-CM | POA: Diagnosis present

## 2024-03-31 DIAGNOSIS — Z809 Family history of malignant neoplasm, unspecified: Secondary | ICD-10-CM | POA: Insufficient documentation

## 2024-03-31 LAB — GENETIC SCREENING ORDER

## 2024-03-31 NOTE — Progress Notes (Signed)
 REFERRING PROVIDER: Tina Pauletta BROCKS, MD   PRIMARY PROVIDER:  Kip Righter, MD  PRIMARY REASON FOR VISIT:  1. Prostate cancer (HCC)   2. Family history of cancer      HISTORY OF PRESENT ILLNESS:   Damon Walker, a 78 y.o. male, was seen for a [place] cancer genetics consultation at the request of Tina Pauletta BROCKS, MD due to a personal and family history of cancer.  Damon Walker presents to clinic today to discuss the possibility of a hereditary predisposition to cancer, to discuss genetic testing, and to further clarify his future cancer risks, as well as potential cancer risks for family members.   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    CANCER HISTORY:  Oncology History  Prostate cancer [facility])  03/2023 Initial Diagnosis   Prostate cancer [facility])  Patient presented in August 2024 with retention and a PSA of 24. Report a nodular prostate on exam.   He underwent TURP and prostate biopsy October 2024. 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    06/27/2023 PET scan   PSMA PET prostate gland activity, left and right seminal vesicle invasion, bilateral pelvic, retroperitoneal, mediastinal and supraclavicular lymphadenopathy. No bone lesions.    07/2023 -  Chemotherapy   Started Eligard    10/2023 -  Chemotherapy   Started darolutamide    11/2023 Tumor Marker   PSA 1.2   01/2024 Tumor Marker   PSA 0.6. T<3   01/08/2024 Cancer Staging   Staging form: Prostate, AJCC 8th Edition - Clinical: Stage IVB (cT3b, cN1, cM1a, PSA: 24, Grade Group: 5) - Signed by Tina Pauletta BROCKS, MD on 01/08/2024 Prostate specific antigen (PSA) range: 20 or greater Histologic grading system: 5 grade system    01/12/2024 Cancer Staging   Staging form: Prostate, AJCC 8th Edition - Pathologic: Stage IVB (pT3b, pN1, cM1, PSA: 24, Grade Group: 5) - Signed by Tina Pauletta BROCKS, MD on 01/12/2024 Prostate specific antigen (PSA) range: 20 or greater Histologic grading system: 5 grade system   02/23/2024 Tumor Marker   PSA 0.3       Past Medical History:  Diagnosis Date   Carotid arterial disease (HCC) 06/2014; 01/09/2015   Followed by Dr. Oris of [facility] -- a) Carotid u/s: 40-59% bilat ICA stenosis; b) 10/2019: Carotid Dopplers October 20, 2019: R ICA 1-39%, LICA 40-59%.  Right vertebral artery appears occluded.  Normal subclavian flow.  Normal left vertebral artery. 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  MRI Brain: Acute L Occipital Lobe Infarct. Chronic microvascular ischemic changes in the white matter and left pons.  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   Chronic combined systolic and diastolic congestive heart failure, NYHA class 2 (HCC) 05/11/2015 - 05/15/15   a. EF 20-25% with anterior-anteroseptal akinesis (immediately post anterior STEMI).  ; b. 10/10/'16 Echo: EF 35-40% with moderate mid-apical anteroseptal, anterior and apical hypokinesis.   Coronary artery disease involving native heart 05/11/2015  Cath: 3V CAD with 90% prox RCA, 80% mid RCA, 80% OM3, (RCA and OM residual treated medically) 100% LAD - PCI to Prox-Mid LAD with Overlapping Promus Premier DES 3.0 x 38 & 3.0 x 16 (post-dilated to 3.5 mm)    Essential hypertension    GERD (gastroesophageal reflux disease)    H/O: GI bleed 05/15/2015   s/p Sigmoid Polypectomy 05/04/2015 - prior to STEMI on May 11, 2015; Had GI Bleed following TPA for CVA, while on ASA & Brilinta ; Flex Sig - 1. Internal  Hemorrhoids, 2. Distal sigmoid polypectomy site with flat Whitebase status post biopsy and tattoo with 2mL spot over 3 injections 3. Proximal sigmoid polypectomy site seen as well, 4) otherwise normal with no evidence of bleed   Hyperlipidemia with target LDL less than 70    Ischemic cardiomyopathy 05/11/2015   EF 40% on cath 05/11/2015 after anterior STEMI, EF 20-25% on echo 05/13/2015; 12/2015 =  EF ~45%.   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   Prediabetes 05/2015   A1C 6.0 in Oct 2016   ST elevation myocardial infarction (STEMI) involving left anterior descending (LAD) coronary artery with complication (HCC) 05/11/2015   100% Prox LAD - PCI with overlapping DES x 2   Stroke (HCC)    Tobacco abuse    a. 30 yrs - 1.5 ppd. - Quit 05/11/2015   Type 2 diabetes mellitus with other specified complication (HCC) 10/23/2021   Vision abnormalities    Right hemianopsia    Past Surgical History:  Procedure Laterality Date   CARDIAC CATHETERIZATION N/A 05/11/2015   Procedure: Left Heart Cath and Coronary Angiography;  Surgeon: Peter M Swaziland, MD;  Location: [facility] INVASIVE CV LAB;  Service: Cardiovascular;  100% pLAD (long lesion). RCA - prox 90%, mid 80%. OM2 & OM3 80% (OM2 & 3 not necessarily PCI targets); EF 35-45% with Anterio HK   CARDIAC CATHETERIZATION N/A 05/11/2015   Procedure: Coronary Stent Intervention;  Surgeon: Peter M Swaziland, MD;  Location: [facility] INVASIVE CV LAB;  Service: Cardiovascular; PCI to Prox-Mid LAD with Overlapping Promus Premier DES 3.0 x 38 & 3.0 x 16 (post-dilated to 3.5 mm)     FLEXIBLE SIGMOIDOSCOPY N/A 05/18/2015   Procedure: FLEXIBLE SIGMOIDOSCOPY;  Surgeon: Oliva Boots, MD;  Location: [facility] ENDOSCOPY;  Service: Endoscopy;  Laterality: N/A;   NM MYOVIEW  LTD  08/2016   EF 30% with large anterior-anteroseptal and apical infarct consistent with LAD infarct. His HIGH RISK because of large infarct. No ischemia.    PROSTATE BIOPSY N/A 05/27/2023   Procedure: BIOPSY TRANSRECTAL ULTRASONIC PROSTATE (TUBP);  Surgeon: Nieves Cough, MD;  Location: [facility] ORS;  Service: Urology;  Laterality: N/A;   S/P Appendectomy     Age 64   S/P Inguinal Hernia Repair     In his 20's.   TRANSCAROTID ARTERY REVASCULARIZATION  Left 04/30/2022   Procedure: Transcarotid Artery Revascularization;  Surgeon: Eliza Lonni RAMAN, MD;  Location: [facility] OR;  Service: Vascular;  Laterality: Left;   TRANSTHORACIC ECHOCARDIOGRAM  05/15/2015   Mild LVH, EF 35-40% - Mod HK of mid-apical anteroseptal, anterior & apical walls.  Gr 1 DD. Mild bilateral Atrial Enlargement.   TRANSTHORACIC ECHOCARDIOGRAM  12/2015   EF up to ~40-45% wiht Anterior-Anteroapical HK.  Mod LV dilation with increased LVEDP.   TRANSTHORACIC ECHOCARDIOGRAM  12/'17; 4/'18   a) EF ~30-35% (in setting of HTN Urgency). - Gr 2 DD;; b) EF 30-35% (per Dr.  Croitoru - 35-40%). Anterior-anteroapical Akinesis. Gr2 DD w/ high filling pressures. Mild LA dilation. Mild RV dilation.   TRANSURETHRAL RESECTION OF PROSTATE N/A 05/27/2023   Procedure: TRANSURETHRAL RESECTION OF THE PROSTATE (TURP);  Surgeon: Nieves Cough, MD;  Location: [facility] ORS;  Service: Urology;  Laterality: N/A;   ULTRASOUND GUIDANCE FOR VASCULAR ACCESS Right 04/30/2022   Procedure: ULTRASOUND GUIDANCE FOR VASCULAR ACCESS;  Surgeon: Eliza Lonni RAMAN, MD;  Location: [facility] OR;  Service: Vascular;  Laterality: Right;    Social History   Socioeconomic History   Marital status: Married    Spouse name: Not on file   Number of children: 3   Years of education: Not on file   Highest education level: Not on file  Occupational History   Occupation: retired from catering business  Tobacco Use   Smoking status: Former    Current packs/day: 0.00    Average packs/day: 1 pack/day for 30.0 years (30.0 ttl pk-yrs)    Types: Cigarettes    Start date: 04/05/1985    Quit date: 04/06/2015    Years since quitting: 8.9     Passive exposure: Current (Wife is a smoker)   Smokeless tobacco: Never   Tobacco comments:    Quit the day of his STEMI  Vaping Use   Vaping status: Never Used  Substance and Sexual Activity   Alcohol use: No    Alcohol/week: 0.0 standard drinks of alcohol   Drug use: No   Sexual activity: Not on file  Other Topics Concern   Not on file  Social History Narrative   Lives in [place] with wife. Originally from [place].   Social Drivers of Corporate investment banker Strain: Not on file  Food Insecurity: No Food Insecurity (01/12/2024)   Hunger Vital Sign    Worried About Running Out of Food in the Last Year: Never true    Ran Out of Food in the Last Year: Never true  Transportation Needs: No Transportation Needs (01/12/2024)   PRAPARE - Administrator, Civil Service (Medical): No    Lack of Transportation (Non-Medical): No  Physical Activity: Not on file  Stress: Not on file  Social Connections: Not on file     FAMILY HISTORY:  We obtained a detailed, 4-generation family history.  Significant diagnoses are listed below: Family History  Adopted: Yes  Problem Relation Age of Onset   Other Mother        eczema    Cancer Mother    Other Other        Adopted - unaware of biological parent's histories.   Cancer Half-Sister    Stroke Neg Hx     Damon Walker is unaware of previous family history of genetic testing for hereditary cancer risks. Patient's maternal ancestors are of [place] descent, and paternal ancestors are of [place] descent. There is no reported Ashkenazi Jewish ancestry. Damon Walker shared that he was adopted and has limited information regarding the health history of biologic relatives. He reports his biologic mother and maternal half sister have been diagnosed with cancer, but is unsure of specifics.      GENETIC COUNSELING ASSESSMENT: Damon Walker is a 78 y.o. male with a personal and family history of cancer which is somewhat suggestive of a  hereditary predisposition to cancer given his history of a metastatic (stage IVB) prostate cancer. We, therefore, discussed and recommended the following at today's visit.   DISCUSSION: We discussed that 5 - 10% of cancer is hereditary, with  most cases of hereditary prostate associated with BRCA1/2 pathogenic variants.  There are other genes that can be associated with hereditary prostate cancer syndromes.  We discussed that testing is beneficial for several reasons including knowing how to follow individuals after completing their treatment, identifying whether potential treatment options, such as PARP inhibitors, would be beneficial, and understanding if other family members could be at risk for cancer and allowing them to undergo genetic testing.   We reviewed the characteristics, features and inheritance patterns of hereditary cancer syndromes. We also discussed genetic testing, including the appropriate family members to test, the process of testing, insurance coverage and turn-around-time for results. We discussed the implications of a negative, positive, carrier and/or variant of uncertain significant result. We recommended Damon Walker pursue genetic testing for a panel that includes genes associated with prostate cancer and neuroendocrine tumors.   Damon Walker  was offered a common hereditary cancer panel (40 genes) and an expanded pan-cancer panel (77 genes). Damon Walker was informed of the benefits and limitations of each panel, including that expanded pan-cancer panels contain genes that do not have clear management guidelines at this point in time.  We also discussed that as the number of genes included on a panel increases, the chances of variants of uncertain significance increases. After considering the benefits and limitations of each gene panel, Damon Walker elected to have [facility] CancerNext-Expanded +RNAinsight panel. The CancerNext-Expanded gene panel offered by [facility] and  includes sequencing, rearrangement, and RNA analysis for the following 77 genes: AIP, ALK, APC, ATM, AXIN2, BAP1, BARD1, BMPR1A, BRCA1, BRCA2, BRIP1, CDC73, CDH1, CDK4, CDKN1B, CDKN2A, CEBPA, CHEK2, CTNNA1, DDX41, DICER1, EGFR, EPCAM, ETV6, FH, FLCN, GATA2, GREM1, HOXB13, KIT, LZTR1, MAX, MBD4, MEN1, MET, MITF, MLH1, MSH2, MSH3, MSH6, MUTYH, NF1, NF2, NTHL1, PALB2, PDGFRA, PHOX2B, PMS2, POLD1, POLE, POT1, PRKAR1A, PTCH1, PTEN, RAD51C, RAD51D, RB1, RET, RPS20, RUNX1, SDHA, SDHAF2, SDHB, SDHC, SDHD, SMAD4, SMARCA4, SMARCB1, SMARCE1, STK11, SUFU, TMEM127, TP53, TSC1, TSC2, VHL, WT1.   Based on Damon Walker's personal and family history of cancer, he meets medical criteria for genetic testing. Despite that he meets criteria, he may still have an out of pocket cost. We discussed that if his out of pocket cost for testing is over $100, the laboratory should contact them to discuss self-pay prices, patient pay assistance programs, if applicable, and other billing options.  PLAN: After considering the risks, benefits, and limitations, Damon Walker provided informed consent to pursue genetic testing and the blood sample was sent to [facility] for analysis of the CancerNext-Expanded +RNAinsight test. Results should be available within approximately 2-3 weeks' time, at which point they will be disclosed by telephone to Damon Walker and/or his wife, as will any additional recommendations warranted by these results. Damon Walker will receive a summary of his genetic counseling visit and a copy of his results once available. This information will also be available in Epic.   Lastly, we encouraged Damon Walker to remain in contact with cancer genetics annually so that we can continuously update the family history and inform him of any changes in cancer genetics and testing that may be of benefit for this family.   Damon Walker questions were answered to his satisfaction today. Our contact information was  provided should additional questions or concerns arise. Thank you for the referral and allowing us  to share in the care of your patient.   Burnard Ogren, MS, [facility] Licensed, Retail [email or web address] phone: [phone]   40 minutes were spent  on the date of the encounter in service to the patient including preparation, face-to-face consultation, documentation and care coordination.  The patient brought his wife, Damon Walker.  Drs. Gudena and/or Lanny were available to discuss this case as needed.   _______________________________________________________________________ For Office Staff:  Number of people involved in session: 1 Was an Intern/ student involved with case: no

## 2024-03-31 NOTE — Progress Notes (Signed)
 Specialty Pharmacy Refill Coordination Note  Damon Walker is a 78 y.o. male contacted today regarding refills of specialty medication(s) Darolutamide  (Nubeqa )   Patient requested [name] at [facility] [place] date: 04/01/24   Medication will be filled on 03/31/24.

## 2024-04-06 ENCOUNTER — Other Ambulatory Visit: Payer: Self-pay

## 2024-04-06 ENCOUNTER — Ambulatory Visit: Payer: Self-pay | Admitting: Genetic Counselor

## 2024-04-06 ENCOUNTER — Telehealth: Payer: Self-pay | Admitting: Genetic Counselor

## 2024-04-06 DIAGNOSIS — Z1379 Encounter for other screening for genetic and chromosomal anomalies: Secondary | ICD-10-CM

## 2024-04-06 NOTE — Telephone Encounter (Signed)
 I spoke to Damon Walker's wife Damon Walker (per patient request) to review results of genetic testing. he had genetic testing with [facility] CancerNext-Expanded +RNAinsight panel. Testing did not identify any variants known to increase the risk for cancer.  Discussed that we do not know why he has prostate cancer or why there is cancer in the family. It could be that the cancer in his family were sporadic, due to a different gene that we are not testing, or maybe our current technology may not be able to pick something up.  It will be important for him to keep in contact with genetics to keep up with whether additional testing may be needed.  Please see counseling note for further detail on this result.

## 2024-04-07 DIAGNOSIS — Z1379 Encounter for other screening for genetic and chromosomal anomalies: Secondary | ICD-10-CM | POA: Insufficient documentation

## 2024-04-07 NOTE — Progress Notes (Signed)
 HPI:  [name] was previously seen in the [place] [facility] due to a personal and family history of cancer and concerns regarding a hereditary predisposition to cancer. Please refer to our prior cancer genetics clinic note for more information regarding our discussion, assessment and recommendations, at the time. [name] recent genetic test results were disclosed to his wife by phone, as were recommendations warranted by these results. These results and recommendations are discussed in more detail below.  CANCER HISTORY:  Oncology History  Prostate cancer [facility])  03/2023 Initial Diagnosis   Prostate cancer [facility])  Patient presented in August 2024 with retention and a PSA of 24. Report a nodular prostate on exam.   He underwent TURP and prostate biopsy October 2024. 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    06/27/2023 PET scan   PSMA PET prostate gland activity, left and right seminal vesicle invasion, bilateral pelvic, retroperitoneal, mediastinal and supraclavicular lymphadenopathy. No bone lesions.    07/2023 -  Chemotherapy   Started Eligard    10/2023 -  Chemotherapy   Started darolutamide    11/2023 Tumor Marker   PSA 1.2   01/2024 Tumor Marker   PSA 0.6. T<3   01/08/2024 Cancer Staging   Staging form: Prostate, AJCC 8th Edition - Clinical: Stage IVB (cT3b, cN1, cM1a, PSA: 24, Grade Group: 5) - Signed by Tina Pauletta BROCKS, MD on 01/08/2024 Prostate specific antigen (PSA) range: 20 or greater Histologic grading system: 5 grade system   01/12/2024 Cancer Staging   Staging form: Prostate, AJCC 8th Edition - Pathologic: Stage IVB (pT3b, pN1, cM1, PSA: 24, Grade Group: 5) - Signed by Tina Pauletta BROCKS, MD on 01/12/2024 Prostate specific antigen (PSA) range: 20 or greater Histologic grading system: 5 grade system   02/23/2024 Tumor Marker   PSA 0.3     FAMILY HISTORY:  We  obtained a detailed, 4-generation family history.  Significant diagnoses are listed below: Family History  Adopted: Yes  Problem Relation Age of Onset   Other Mother        eczema    Cancer Mother    Other Other        Adopted - unaware of biological parent's histories.   Cancer Half-Sister    Stroke Neg Hx     [name] is unaware of previous family history of genetic testing for hereditary cancer risks. Patient's maternal ancestors are of [place] descent, and paternal ancestors are of [place] descent. There is no reported Ashkenazi Jewish ancestry. [name] shared that he was adopted and has limited information regarding the health history of biologic relatives. He reports his biologic mother and maternal half sister have been diagnosed with cancer, but is unsure of specifics.      GENETIC TEST RESULTS: Genetic testing reported out on 04/05/24 through the [facility] CancerNext-Expanded +RNAinsight panel found no pathogenic mutations. The CancerNext-Expanded gene panel offered by [facility] and includes sequencing, rearrangement, and RNA analysis for the following 77 genes: AIP, ALK, APC, ATM, AXIN2, BAP1, BARD1, BMPR1A, BRCA1, BRCA2, BRIP1, CDC73, CDH1, CDK4, CDKN1B, CDKN2A, CEBPA, CHEK2, CTNNA1, DDX41, DICER1, EGFR, EPCAM, ETV6, FH, FLCN, GATA2, GREM1, HOXB13, KIT, LZTR1, MAX, MBD4, MEN1, MET, MITF, MLH1, MSH2, MSH3, MSH6, MUTYH, NF1, NF2, NTHL1, PALB2, PDGFRA, PHOX2B, PMS2, POLD1, POLE, POT1, PRKAR1A, PTCH1, PTEN, RAD51C, RAD51D, RB1, RET, RPS20, RUNX1, SDHA, SDHAF2, SDHB, SDHC, SDHD, SMAD4, SMARCA4,  SMARCB1, SMARCE1, STK11, SUFU, TMEM127, TP53, TSC1, TSC2, VHL, WT1. The test report has been scanned into EPIC and is located under the Molecular Pathology section of the Results Review tab.  A portion of the result report is included below for reference.     We discussed that because current genetic testing is not perfect, it is possible there may be a gene mutation in one of these genes  that current testing cannot detect, but that chance is small.  We also discussed, that there could be another gene that has not yet been discovered, or that we have not yet tested, that is responsible for the cancer diagnoses in the family. It is also possible there is a hereditary cause for the cancer in the family that [name] did not inherit and therefore was not identified in his testing.  Therefore, it is important to remain in touch with cancer genetics in the future so that we can continue to offer [name] the most up to date genetic testing.   ADDITIONAL GENETIC TESTING: We discussed that his genetic testing was fairly extensive.  If there are genes identified to increase cancer risk that can be analyzed in the future, we would be happy to discuss and coordinate this testing at that time.    CANCER SCREENING RECOMMENDATIONS: [name] test result is considered negative (normal).  This means that we have not identified a hereditary cause for his personal and family history of cancer at this time. Most cancers happen by chance and this negative test suggests that his personal and family history of cancer may fall into this category.    Possible reasons for [name]'s negative genetic test include:  1. There may be a gene mutation in one of these genes that current testing methods cannot detect but that chance is small.  2. There could be another gene that has not yet been discovered, or that we have not yet tested, that is responsible for the cancer diagnoses in the family.  3.  There may be no hereditary risk for cancer in the family. The cancers in [name] and/or his family may be sporadic/familial or due to other genetic and environmental factors. 4. It is also possible there is a hereditary cause for the cancer in the family that [name] did not inherit.  Therefore, it is recommended he continue to follow the cancer management and screening guidelines provided by  his oncology and primary healthcare provider. An individual's cancer risk and medical management are not determined by genetic test results alone. Overall cancer risk assessment incorporates additional factors, including personal medical history, family history, and any available genetic information that may result in a personalized plan for cancer prevention and surveillance  RECOMMENDATIONS FOR FAMILY MEMBERS:  Individuals in this family might be at some increased risk of developing cancer, over the general population risk, simply due to the family history of cancer.  We recommended women in this family have a yearly mammogram beginning at age 4, or 10 years younger than the earliest onset of cancer, an annual clinical breast exam, and perform monthly breast self-exams. Women in this family should also have a gynecological exam as recommended by their primary provider. All family members should be referred for colonoscopy starting at age 69, or 31 years younger than the earliest onset of cancer.  FOLLOW-UP: Lastly, we discussed with Mr. Wyszynski that cancer genetics is a rapidly advancing field and it is possible that new genetic tests will be appropriate  for him and/or his family members in the future. We encouraged him to remain in contact with cancer genetics on an annual basis so we can update his personal and family histories and let him know of advances in cancer genetics that may benefit this family.   Our contact number was provided. Mr. Horen questions were answered to his satisfaction, and he knows he is welcome to call us  at anytime with additional questions or concerns.   Damon Ogren, MS, [facility] Licensed, Retail [email or web address]

## 2024-04-08 ENCOUNTER — Other Ambulatory Visit: Payer: Self-pay

## 2024-04-08 NOTE — Progress Notes (Signed)
 Specialty Pharmacy Ongoing Clinical Assessment Note  Damon Walker is a 78 y.o. male who is being followed by the specialty pharmacy service for RxSp Oncology   Patient's specialty medication(s) reviewed today: Darolutamide  (Nubeqa )   Missed doses in the last 4 weeks: 0   Patient/Caregiver did not have any additional questions or concerns.   Therapeutic benefit summary: Patient is achieving benefit   Adverse events/side effects summary: No adverse events/side effects   Patient's therapy is appropriate to: Continue    Goals Addressed             This Visit's Progress    Maintain optimal adherence to therapy   On track    Patient is on track. Patient will maintain adherence         Follow up: 3 months  [facility] Specialty Pharmacist

## 2024-04-28 ENCOUNTER — Other Ambulatory Visit: Payer: Self-pay

## 2024-04-29 ENCOUNTER — Other Ambulatory Visit: Payer: Self-pay | Admitting: Pharmacy Technician

## 2024-04-29 ENCOUNTER — Other Ambulatory Visit: Payer: Self-pay

## 2024-04-29 NOTE — Progress Notes (Signed)
 Specialty Pharmacy Refill Coordination Note  Damon Walker is a 78 y.o. male contacted today regarding refills of specialty medication(s) Darolutamide  (Nubeqa )   Patient requested [name] at [facility] [place] date: 04/30/24   Medication will be filled on 04/29/24.  Spoke to patient's wife Berwyn.

## 2024-04-30 ENCOUNTER — Other Ambulatory Visit: Payer: Self-pay

## 2024-05-17 ENCOUNTER — Inpatient Hospital Stay: Payer: Medicare (Managed Care)

## 2024-05-17 ENCOUNTER — Other Ambulatory Visit: Payer: Self-pay

## 2024-05-17 DIAGNOSIS — C61 Malignant neoplasm of prostate: Secondary | ICD-10-CM

## 2024-05-17 DIAGNOSIS — M25551 Pain in right hip: Secondary | ICD-10-CM | POA: Diagnosis not present

## 2024-05-17 DIAGNOSIS — E291 Testicular hypofunction: Secondary | ICD-10-CM | POA: Diagnosis not present

## 2024-05-17 LAB — CMP (CANCER CENTER ONLY)
ALT: 13 U/L (ref 0–44)
AST: 15 U/L (ref 15–41)
Albumin: 4 g/dL (ref 3.5–5.0)
Alkaline Phosphatase: 50 U/L (ref 38–126)
Anion gap: 5 (ref 5–15)
BUN: 19 mg/dL (ref 8–23)
CO2: 30 mmol/L (ref 22–32)
Calcium: 9.8 mg/dL (ref 8.9–10.3)
Chloride: 107 mmol/L (ref 98–111)
Creatinine: 1.16 mg/dL (ref 0.61–1.24)
GFR, Estimated: >60 mL/min (ref >60)
Glucose, Bld: 107 mg/dL — ABNORMAL HIGH (ref 70–99)
Potassium: 4.2 mmol/L (ref 3.5–5.1)
Sodium: 142 mmol/L (ref 135–145)
Total Bilirubin: 1 mg/dL (ref 0.0–1.2)
Total Protein: 7.2 g/dL (ref 6.5–8.1)

## 2024-05-17 LAB — CBC WITH DIFFERENTIAL (CANCER CENTER ONLY)
Abs Immature Granulocytes: 0.03 K/uL (ref 0.00–0.07)
Basophils Absolute: 0.1 K/uL (ref 0.0–0.1)
Basophils Relative: 1 %
Eosinophils Absolute: 0.4 K/uL (ref 0.0–0.5)
Eosinophils Relative: 5 %
HCT: 35.5 % — ABNORMAL LOW (ref 39.0–52.0)
Hemoglobin: 12 g/dL — ABNORMAL LOW (ref 13.0–17.0)
Immature Granulocytes: 0 %
Lymphocytes Relative: 26 %
Lymphs Abs: 2.2 K/uL (ref 0.7–4.0)
MCH: 31.7 pg (ref 26.0–34.0)
MCHC: 33.8 g/dL (ref 30.0–36.0)
MCV: 93.9 fL (ref 80.0–100.0)
Monocytes Absolute: 0.7 K/uL (ref 0.1–1.0)
Monocytes Relative: 9 %
Neutro Abs: 4.9 K/uL (ref 1.7–7.7)
Neutrophils Relative %: 59 %
Platelet Count: 246 K/uL (ref 150–400)
RBC: 3.78 MIL/uL — ABNORMAL LOW (ref 4.22–5.81)
RDW: 12.6 % (ref 11.5–15.5)
WBC Count: 8.4 K/uL (ref 4.0–10.5)
nRBC: 0 % (ref 0.0–0.2)

## 2024-05-17 LAB — PSA: Prostatic Specific Antigen: 0.14 ng/mL (ref 0.00–4.00)

## 2024-05-17 NOTE — Assessment & Plan Note (Addendum)
 On rosuvastatin

## 2024-05-17 NOTE — Assessment & Plan Note (Addendum)
 Last EF 30-35% HF was 0.9% from ARANOTE trial. Same as placebo On Lasix  20 mg Continue monitor and follow up with cardiology as well

## 2024-05-17 NOTE — Assessment & Plan Note (Addendum)
 Continue ADT, last Eligard  on 01/07/2024. Will see if can switch to Orgovyx in Dec. Decrease darolutamide  300 mg twice daily Repeat PSA and lab next month. Last ADT was 01/07/24 45 mg dose Negative genetic testing.

## 2024-05-17 NOTE — Progress Notes (Unsigned)
 [facility] OFFICE PROGRESS NOTE  Patient Care Team: Kip Righter, MD as PCP - General (Family Medicine) Anner Alm ORN, MD as PCP - Cardiology (Cardiology) Jerri Pfeiffer, MD as Consulting Physician (Neurology) Roz Anes, MD as Consulting Physician (Ophthalmology) Vertell Pont, RN as Oncology Nurse Navigator  Damon Walker is a 78 y.o.male with history of ischemic cardiomyopathy EF of 30-35%, MI, CAD on plavix , HTN, being seen [facility] for prostate cancer.   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 GG5 GS 4+5. iPSA 24.   Current diagnosis: mHSPC Germline testing:  Genetic testing reported out on 04/05/24 through the [facility] CancerNext-Expanded +RNAinsight panel found no pathogenic mutations  Somatic testing: not yet Treatment: ADT(07/2023) Clement (10/2023)   PSA down to 0.14.  Last ADT was Lupron  on 6/4 at 45 mg.  Experiencing side effects. More fatigue, weakness. Given his weakness will return for lab and visit before starting Orgovyx. If testosterone  remains low, will hold off starting ADT. Will decrease darolutamide  to 300 mg twice daily.  Osteopenia. Recommend vit D and calcium  and exercises as able. Assessment & Plan Prostate cancer [facility]) Continue ADT, last Eligard  on 01/07/2024. Will see if can switch to Orgovyx in Dec. Decrease darolutamide  300 mg twice daily Repeat PSA and lab next month. Last ADT was 01/07/24 45 mg dose Negative genetic testing. Combined systolic and diastolic heart failure, unspecified HF chronicity (HCC) Last EF 30-35% HF was 0.9% from ARANOTE trial. Same as placebo On Lasix  20 mg as needed Continue monitor and follow up with cardiology as well Hyperlipidemia with target low density lipoprotein (LDL) cholesterol less than 55 mg/dL On rosuvastatin   At risk for side effect of  medication Supportive baseline bone mineral density calcium  (1000-1200 mg daily from food and supplements) and vitamin D3 (1000 IU daily) Healthy lifestyle to prevent diabetes and CV disease Heart healthy diet Aggressive cardiovascular risk management exercises daily as able Limit alcohol consumption and avoid smoking Right hip pain Persistent. Will get Right hip XR  Patient education Decrease darolutamide  or Nubeqa  to 300 mg (1 pill) twice daily. Go to the hospital for hip x-ray due to persistent right sided hip pain. Continue movement, exercise as able to help with bone density and strength Take vitamin D calcium . calcium  (1000-1200 mg daily from food and supplements) and vitamin D3 (1000 IU daily) Heart healthy diet and continue adequate control of cholesterol and blood pressure Return in mid December, labs a few days before visit  Orders Placed This Encounter  Procedures   XR HIP UNILAT W OR W/O PELVIS 2-3 VIEWS RIGHT    Reason for Exam (SYMPTOM  OR DIAGNOSIS REQUIRED):   persistent R hip pain r/o arithritis    Preferred imaging location?:   [facility]   CBC with Differential (Cancer Center Only)    Standing Status:   Future    Expiration Date:   05/18/2025   CMP (Cancer Center only)    Standing Status:   Future    Expiration Date:   05/18/2025   Testosterone     Standing Status:   Future    Expiration Date:   05/18/2025   PSA    Standing Status:   Future    Expiration Date:   05/18/2025     [name], MD  INTERVAL HISTORY: Patient returns for follow-up. Report mood swing sometimes. Report  of difficulty walking and lack of exercise. Report of pain in the hips prevent from exercises.  No active chest pain.  Oncology History  Prostate cancer [facility])  03/2023 Initial Diagnosis   Prostate cancer [facility])  Patient presented in August 2024 with retention and a PSA of 24. Report a nodular prostate on exam.   He underwent TURP and prostate biopsy October 2024.  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    06/27/2023 PET scan   PSMA PET prostate gland activity, left and right seminal vesicle invasion, bilateral pelvic, retroperitoneal, mediastinal and supraclavicular lymphadenopathy. No bone lesions.    07/2023 -  Chemotherapy   Started Eligard    10/2023 -  Chemotherapy   Started darolutamide    11/2023 Tumor Marker   PSA 1.2   01/2024 Tumor Marker   PSA 0.6. T<3   01/08/2024 Cancer Staging   Staging form: Prostate, AJCC 8th Edition - Clinical: Stage IVB (cT3b, cN1, cM1a, PSA: 24, Grade Group: 5) - Signed by Tina Pauletta BROCKS, MD on 01/08/2024 Prostate specific antigen (PSA) range: 20 or greater Histologic grading system: 5 grade system   01/12/2024 Cancer Staging   Staging form: Prostate, AJCC 8th Edition - Pathologic: Stage IVB (pT3b, pN1, cM1, PSA: 24, Grade Group: 5) - Signed by Tina Pauletta BROCKS, MD on 01/12/2024 Prostate specific antigen (PSA) range: 20 or greater Histologic grading system: 5 grade system   02/23/2024 Tumor Marker   PSA 0.3    Genetic Testing   Negative CancerNext-Expanded +RNAinsight panel. The CancerNext-Expanded gene panel offered by [facility] and includes sequencing, rearrangement, and RNA analysis for the following 77 genes: AIP, ALK, APC, ATM, AXIN2, BAP1, BARD1, BMPR1A, BRCA1, BRCA2, BRIP1, CDC73, CDH1, CDK4, CDKN1B, CDKN2A, CEBPA, CHEK2, CTNNA1, DDX41, DICER1, EGFR, EPCAM, ETV6, FH, FLCN, GATA2, GREM1, HOXB13, KIT, LZTR1, MAX, MBD4, MEN1, MET, MITF, MLH1, MSH2, MSH3, MSH6, MUTYH, NF1, NF2, NTHL1, PALB2, PDGFRA, PHOX2B, PMS2, POLD1, POLE, POT1, PRKAR1A, PTCH1, PTEN, RAD51C, RAD51D, RB1, RET, RPS20, RUNX1, SDHA, SDHAF2, SDHB, SDHC, SDHD, SMAD4, SMARCA4, SMARCB1, SMARCE1, STK11, SUFU, TMEM127, TP53, TSC1, TSC2, VHL, WT1. Report date 04/05/24.       PHYSICAL EXAMINATION: ECOG PERFORMANCE STATUS: 2 - Symptomatic, <50% confined to  bed  Vitals:   05/18/24 0858  BP: (!) 117/59  Pulse: 62  Resp: 17  Temp: 97.8 F (36.6 C)  SpO2: 100%   Filed Weights   05/18/24 0858  Weight: 194 lb 9.6 oz (88.3 kg)    GENERAL: alert, no distress and comfortable SKIN: skin color normal and no jaundice  LUNGS: clear to auscultation and no wheeze or rales with normal breathing effort HEART: regular rate & rhythm  ABDOMEN: abdomen soft, non-tender and nondistended. Musculoskeletal: no edema NEURO: no focal motor/sensory deficits  Relevant data reviewed during this visit included labs.  New labs ordered.

## 2024-05-17 NOTE — Assessment & Plan Note (Addendum)
 Supportive baseline bone mineral density calcium  (1000-1200 mg daily from food and supplements) and vitamin D3 (1000 IU daily) Healthy lifestyle to prevent diabetes and CV disease Heart healthy diet Aggressive cardiovascular risk management exercises daily as able Limit alcohol consumption and avoid smoking

## 2024-05-17 NOTE — Progress Notes (Signed)
Labs ordered for tomorrow's visit

## 2024-05-18 ENCOUNTER — Inpatient Hospital Stay: Payer: Medicare (Managed Care)

## 2024-05-18 ENCOUNTER — Other Ambulatory Visit: Payer: Self-pay

## 2024-05-18 ENCOUNTER — Ambulatory Visit (HOSPITAL_COMMUNITY)
Admission: RE | Admit: 2024-05-18 | Discharge: 2024-05-18 | Disposition: A | Discharge status: Home / Self Care | Payer: Medicare (Managed Care) | Source: Ambulatory Visit

## 2024-05-18 VITALS — BP 117/59 | HR 62 | Temp 97.8°F | Resp 17 | Ht 67.0 in | Wt 194.6 lb

## 2024-05-18 DIAGNOSIS — E785 Hyperlipidemia, unspecified: Secondary | ICD-10-CM

## 2024-05-18 DIAGNOSIS — C61 Malignant neoplasm of prostate: Secondary | ICD-10-CM

## 2024-05-18 DIAGNOSIS — I5040 Unspecified combined systolic (congestive) and diastolic (congestive) heart failure: Secondary | ICD-10-CM

## 2024-05-18 DIAGNOSIS — Z9189 Other specified personal risk factors, not elsewhere classified: Secondary | ICD-10-CM | POA: Diagnosis not present

## 2024-05-18 DIAGNOSIS — M25551 Pain in right hip: Secondary | ICD-10-CM

## 2024-05-18 LAB — TESTOSTERONE: Testosterone: 5 ng/dL — ABNORMAL LOW (ref 264–916)

## 2024-05-18 MED ORDER — NUBEQA 300 MG PO TABS
300.0000 mg | ORAL_TABLET | Freq: Two times a day (BID) | ORAL | 11 refills | Status: DC
  Filled 2024-05-18: qty 120, 60d supply, fill #0
  Filled 2024-05-25 – 2024-06-23 (×2): qty 60, 30d supply, fill #0
  Filled 2024-07-16: qty 60, 30d supply, fill #1
  Filled 2024-08-18 – 2024-08-26 (×4): qty 60, 30d supply, fill #2

## 2024-05-18 NOTE — Patient Instructions (Signed)
 Patient education Decrease darolutamide  or Nubeqa  to 300 mg (1 pill) twice daily. Go to the hospital for hip x-ray due to persistent right sided hip pain. Continue movement, exercise as able to help with bone density and strength Take vitamin D calcium . calcium  (1000-1200 mg daily from food and supplements) and vitamin D3 (1000 IU daily) Heart healthy diet and continue adequate control of cholesterol and blood pressure Return in mid December, labs a few days before visit

## 2024-05-20 ENCOUNTER — Ambulatory Visit: Payer: Self-pay

## 2024-05-25 ENCOUNTER — Other Ambulatory Visit: Payer: Self-pay

## 2024-05-27 ENCOUNTER — Other Ambulatory Visit (HOSPITAL_COMMUNITY): Payer: Self-pay

## 2024-05-31 ENCOUNTER — Other Ambulatory Visit: Payer: Self-pay

## 2024-06-09 ENCOUNTER — Telehealth: Payer: Self-pay

## 2024-06-09 NOTE — Telephone Encounter (Signed)
 Tried returning pt call from VM  and someone pick up and hung up several times.

## 2024-06-09 NOTE — Telephone Encounter (Signed)
 Pt return call to confirmed appointment.

## 2024-06-21 ENCOUNTER — Other Ambulatory Visit (HOSPITAL_COMMUNITY): Payer: Self-pay

## 2024-06-22 ENCOUNTER — Other Ambulatory Visit: Payer: Self-pay

## 2024-06-23 ENCOUNTER — Other Ambulatory Visit: Payer: Self-pay | Admitting: Pharmacy Technician

## 2024-06-23 ENCOUNTER — Other Ambulatory Visit: Payer: Self-pay

## 2024-06-23 NOTE — Progress Notes (Signed)
 Specialty Pharmacy Ongoing Clinical Assessment Note  Damon Walker is a 78 y.o. male who is being followed by the specialty pharmacy service for RxSp Oncology   Patient's specialty medication(s) reviewed today: Darolutamide  (Nubeqa )   Missed doses in the last 4 weeks: 0   Patient/Caregiver did not have any additional questions or concerns.   Therapeutic benefit summary: Patient is achieving benefit   Adverse events/side effects summary: Experienced adverse events/side effects   Patient's therapy is appropriate to: Continue    Goals Addressed             This Visit's Progress    Maintain optimal adherence to therapy   On track    Patient is on track. Patient will maintain adherence         Follow up: 3 months  Lashondra Vaquerano M Boen Sterbenz Specialty Pharmacist

## 2024-06-23 NOTE — Progress Notes (Signed)
 Specialty Pharmacy Refill Coordination Note  Damon Walker is a 78 y.o. male contacted today regarding refills of specialty medication(s) Darolutamide  (Nubeqa )  Spoke with Wife  Patient requested [name] at [facility] at [place] date: 06/25/24   Medication will be filled on: 06/24/24

## 2024-07-12 ENCOUNTER — Inpatient Hospital Stay: Payer: Medicare (Managed Care)

## 2024-07-12 DIAGNOSIS — I10 Essential (primary) hypertension: Secondary | ICD-10-CM | POA: Insufficient documentation

## 2024-07-12 DIAGNOSIS — C61 Malignant neoplasm of prostate: Secondary | ICD-10-CM | POA: Diagnosis present

## 2024-07-12 DIAGNOSIS — E291 Testicular hypofunction: Secondary | ICD-10-CM | POA: Insufficient documentation

## 2024-07-12 DIAGNOSIS — M8589 Other specified disorders of bone density and structure, multiple sites: Secondary | ICD-10-CM | POA: Insufficient documentation

## 2024-07-12 LAB — CMP (CANCER CENTER ONLY)
ALT: 14 U/L (ref 0–44)
AST: 20 U/L (ref 15–41)
Albumin: 4.3 g/dL (ref 3.5–5.0)
Alkaline Phosphatase: 55 U/L (ref 38–126)
Anion gap: 10 (ref 5–15)
BUN: 26 mg/dL — ABNORMAL HIGH (ref 8–23)
CO2: 26 mmol/L (ref 22–32)
Calcium: 9.5 mg/dL (ref 8.9–10.3)
Chloride: 109 mmol/L (ref 98–111)
Creatinine: 1.25 mg/dL — ABNORMAL HIGH (ref 0.61–1.24)
GFR, Estimated: 58 mL/min — ABNORMAL LOW (ref >60)
Glucose, Bld: 92 mg/dL (ref 70–99)
Potassium: 4.1 mmol/L (ref 3.5–5.1)
Sodium: 145 mmol/L (ref 135–145)
Total Bilirubin: 0.7 mg/dL (ref 0.0–1.2)
Total Protein: 7.5 g/dL (ref 6.5–8.1)

## 2024-07-12 LAB — CBC WITH DIFFERENTIAL (CANCER CENTER ONLY)
Abs Immature Granulocytes: 0.02 K/uL (ref 0.00–0.07)
Basophils Absolute: 0.1 K/uL (ref 0.0–0.1)
Basophils Relative: 1 %
Eosinophils Absolute: 0.5 K/uL (ref 0.0–0.5)
Eosinophils Relative: 6 %
HCT: 35.7 % — ABNORMAL LOW (ref 39.0–52.0)
Hemoglobin: 11.8 g/dL — ABNORMAL LOW (ref 13.0–17.0)
Immature Granulocytes: 0 %
Lymphocytes Relative: 26 %
Lymphs Abs: 2.2 K/uL (ref 0.7–4.0)
MCH: 30.7 pg (ref 26.0–34.0)
MCHC: 33.1 g/dL (ref 30.0–36.0)
MCV: 93 fL (ref 80.0–100.0)
Monocytes Absolute: 0.7 K/uL (ref 0.1–1.0)
Monocytes Relative: 8 %
Neutro Abs: 4.9 K/uL (ref 1.7–7.7)
Neutrophils Relative %: 59 %
Platelet Count: 235 K/uL (ref 150–400)
RBC: 3.84 MIL/uL — ABNORMAL LOW (ref 4.22–5.81)
RDW: 12.6 % (ref 11.5–15.5)
WBC Count: 8.4 K/uL (ref 4.0–10.5)
nRBC: 0 % (ref 0.0–0.2)

## 2024-07-12 LAB — PSA: Prostatic Specific Antigen: 0.07 ng/mL (ref 0.00–4.00)

## 2024-07-13 LAB — TESTOSTERONE: Testosterone: <3 ng/dL — ABNORMAL LOW (ref 264–916)

## 2024-07-15 ENCOUNTER — Inpatient Hospital Stay: Payer: Medicare (Managed Care)

## 2024-07-16 ENCOUNTER — Other Ambulatory Visit (HOSPITAL_COMMUNITY): Payer: Self-pay

## 2024-07-19 ENCOUNTER — Ambulatory Visit: Payer: Medicare (Managed Care) | Admitting: Urology

## 2024-07-19 VITALS — BP 131/61 | HR 58

## 2024-07-19 DIAGNOSIS — C772 Secondary and unspecified malignant neoplasm of intra-abdominal lymph nodes: Secondary | ICD-10-CM

## 2024-07-19 DIAGNOSIS — N138 Other obstructive and reflux uropathy: Secondary | ICD-10-CM

## 2024-07-19 DIAGNOSIS — C61 Malignant neoplasm of prostate: Secondary | ICD-10-CM

## 2024-07-19 DIAGNOSIS — R3912 Poor urinary stream: Secondary | ICD-10-CM

## 2024-07-19 LAB — URINALYSIS, ROUTINE W REFLEX MICROSCOPIC
Bilirubin, UA: NEGATIVE
Glucose, UA: NEGATIVE
Ketones, UA: NEGATIVE
Leukocytes,UA: NEGATIVE
Nitrite, UA: NEGATIVE
Protein,UA: NEGATIVE
RBC, UA: NEGATIVE
Specific Gravity, UA: 1.02 (ref 1.005–1.030)
Urobilinogen, Ur: 0.2 mg/dL (ref 0.2–1.0)
pH, UA: 6 (ref 5.0–7.5)

## 2024-07-19 NOTE — Assessment & Plan Note (Addendum)
 Continue ADT, last Eligard  on 01/07/2024. Will see if can switch to Orgovyx in Dec. Decrease darolutamide  300 mg twice daily Repeat PSA and lab next month. Last ADT was 01/07/24 45 mg dose Negative genetic testing.

## 2024-07-19 NOTE — Progress Notes (Signed)
 07/19/2024 11:41 AM   Damon Walker 04/07/1946 [id number]  Referring provider: Kip Righter, MD [address] [place],  [place] [address]  No chief complaint on file.   HPI:  F/u -    1) metastatic prostate cancer-patient presented in August 2024 in retention with a PSA of 24. He had a nodular prostate on exam. He underwent TURP and prostate biopsy October 2024. 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 PET scan from November 2024 revealed prostate gland activity, left and right seminal vesicle invasion, bilateral pelvic, retroperitoneal, mediastinal and supraclavicular lymphadenopathy. No bone lesions. He was started on Eligard  45 mg Dec 2024 with Eligard  22.5 mg 3 month and Nubeqa  Mar 2025. His Apr 2025 PSA 1.2.    Staging:  September 2024-CT scan-prostate 70 g, pelvic and retroperitoneal lymphadenopathy.  Nov 2024 - PSMA PET - revealed prostate gland activity, left and right seminal vesicle invasion, bilateral pelvic, retroperitoneal, mediastinal and supraclavicular lymphadenopathy. No bone lesions. Oct 2025 lumbar spine, hip x ray- no bone lesions    Today, Damon Walker is seen as new pt for above. He is voiding with a good stream. Some incontinence. No pads or diapers.  Now established with Dr. Tina. Genetics-no pathogenic mutations. On ADT with significant fatigue. Considered stopping Eligard . Start Orgovyx when T rebounds, but they may want to go back to Eligard  (no more daily pills). Decrease darolutamide  to 300 mg twice daily. Dec 2025 PSA 0.07. T < 3. Cr 1.25. On tamsulosin .    PMH: Past Medical History:  Diagnosis Date   Carotid arterial disease 06/2014; 01/09/2015   Followed by Dr. Oris of [facility] -- a) Carotid u/s: 40-59% bilat ICA stenosis; b) 10/2019: Carotid Dopplers October 20, 2019: R ICA 1-39%, LICA 40-59%.  Right vertebral artery appears occluded.  Normal subclavian flow.   Normal left vertebral artery. 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  MRI Brain: Acute L Occipital Lobe Infarct. Chronic microvascular ischemic changes in the white matter and left pons.  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   Chronic combined systolic and diastolic congestive heart failure, NYHA class 2 (HCC) 05/11/2015 - 05/15/15   a. EF 20-25% with anterior-anteroseptal akinesis (immediately post anterior STEMI).  ; b. 10/10/'16 Echo: EF 35-40% with moderate mid-apical anteroseptal, anterior and apical hypokinesis.   Coronary artery disease involving native heart 05/11/2015   Cath: 3V CAD with 90% prox RCA, 80% mid RCA, 80% OM3, (RCA and OM residual treated medically) 100% LAD - PCI to Prox-Mid LAD with Overlapping Promus Premier DES 3.0 x 38 & 3.0 x 16 (post-dilated to 3.5 mm)    Essential hypertension    GERD (gastroesophageal reflux disease)    H/O: GI bleed 05/15/2015   s/p Sigmoid Polypectomy 05/04/2015 - prior to STEMI on May 11, 2015; Had GI Bleed following TPA for CVA, while on ASA & Brilinta ; Flex Sig - 1. Internal Hemorrhoids, 2. Distal sigmoid polypectomy site with flat [place] status post biopsy and tattoo with 2mL spot over 3 injections 3. Proximal sigmoid polypectomy site seen as well, 4) otherwise normal with no evidence of bleed   Hyperlipidemia with target LDL  less than 70    Ischemic cardiomyopathy 05/11/2015   EF 40% on cath 05/11/2015 after anterior STEMI, EF 20-25% on echo 05/13/2015; 12/2015 =  EF ~45%.   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   Prediabetes 05/2015   A1C 6.0 in Oct 2016   ST elevation myocardial infarction (STEMI) involving left  anterior descending (LAD) coronary artery with complication (HCC) 05/11/2015   100% Prox LAD - PCI with overlapping DES x 2   Stroke (HCC)    Tobacco abuse    a. 30 yrs - 1.5 ppd. - Quit 05/11/2015   Type 2 diabetes mellitus with other specified complication (HCC) 10/23/2021   Vision abnormalities    Right hemianopsia    Surgical History: Past Surgical History:  Procedure Laterality Date   CARDIAC CATHETERIZATION N/A 05/11/2015   Procedure: Left Heart Cath and Coronary Angiography;  Surgeon: Peter M Jordan, MD;  Location[facility] INVASIVE CV LAB;  Service: Cardiovascular;  100% pLAD (long lesion). RCA - prox 90%, mid 80%. OM2 & OM3 80% (OM2 & 3 not necessarily PCI targets); EF 35-45% with Anterio HK   CARDIAC CATHETERIZATION N/A 05/11/2015   Procedure: Coronary Stent Intervention;  Surgeon: Peter M Jordan, MD;  Location: [facility] INVASIVE CV LAB;  Service: Cardiovascular; PCI to Prox-Mid LAD with Overlapping Promus Premier DES 3.0 x 38 & 3.0 x 16 (post-dilated to 3.5 mm)     FLEXIBLE SIGMOIDOSCOPY N/A 05/18/2015   Procedure: FLEXIBLE SIGMOIDOSCOPY;  Surgeon: Oliva Boots, MD;  Location: [facility] ENDOSCOPY;  Service: Endoscopy;  Laterality: N/A;   NM MYOVIEW  LTD  08/2016   EF 30% with large anterior-anteroseptal and apical infarct consistent with LAD infarct. His HIGH RISK because of large infarct. No ischemia.   PROSTATE BIOPSY N/A 05/27/2023   Procedure: BIOPSY TRANSRECTAL ULTRASONIC PROSTATE (TUBP);  Surgeon: Nieves Cough, MD;  Location: [facility] ORS;  Service: Urology;  Laterality: N/A;   S/P Appendectomy     Age 78   S/P Inguinal Hernia Repair     In his 80's.   TRANSCAROTID ARTERY REVASCULARIZATION  Left 04/30/2022   Procedure: Transcarotid Artery Revascularization;  Surgeon: Eliza Lonni RAMAN, MD;  Location[facility] OR;  Service: Vascular;  Laterality: Left;   TRANSTHORACIC ECHOCARDIOGRAM  05/15/2015   Mild LVH, EF 35-40% - Mod HK of mid-apical anteroseptal, anterior & apical walls.  Gr 1 DD. Mild  bilateral Atrial Enlargement.   TRANSTHORACIC ECHOCARDIOGRAM  12/2015   EF up to ~40-45% wiht Anterior-Anteroapical HK.  Mod LV dilation with increased LVEDP.   TRANSTHORACIC ECHOCARDIOGRAM  12/'17; 4/'18   a) EF ~30-35% (in setting of HTN Urgency). - Gr 2 DD;; b) EF 30-35% (per Dr. Francyne - 35-40%). Anterior-anteroapical Akinesis. Gr2 DD w/ high filling pressures. Mild LA dilation. Mild RV dilation.   TRANSURETHRAL RESECTION OF PROSTATE N/A 05/27/2023   Procedure: TRANSURETHRAL RESECTION OF THE PROSTATE (TURP);  Surgeon: Nieves Cough, MD;  Location: [facility] ORS;  Service: Urology;  Laterality: N/A;   ULTRASOUND GUIDANCE FOR VASCULAR ACCESS Right 04/30/2022   Procedure: ULTRASOUND GUIDANCE FOR VASCULAR ACCESS;  Surgeon: Eliza Lonni RAMAN, MD;  Location[facility] OR;  Service: Vascular;  Laterality: Right;    Home Medications:  Allergies as of 07/19/2024       Reactions   Latex Hives, Itching, Rash, Other (See Comments)   Blisters        Medication List        Accurate as of July 19, 2024 11:41 AM. If you have any questions, ask your nurse or doctor.          carvedilol  3.125 MG tablet Commonly known as: COREG  Take 1 tablet by mouth twice daily   clopidogrel  75 MG tablet Commonly known as: PLAVIX  Take 1 tablet (75 mg total) by mouth daily.   diclofenac  Sodium 1 % Gel Commonly known as: Voltaren  Apply 2 g topically 4 (four) times daily.   famotidine  40 MG tablet Commonly known as: PEPCID  Take 1 tablet (40 mg total) by mouth every other day.   FeroSul 325 (65 Fe) MG tablet Generic drug: ferrous sulfate  Take 1 tablet (325 mg total) by mouth daily with breakfast.   finasteride  5 MG tablet Commonly known as: PROSCAR  Take 1 tablet (5 mg total) by mouth at bedtime.   furosemide  20 MG tablet Commonly known as: LASIX  TAKE 1 TABLET BY MOUTH ONCE DAILY AS NEEDED   lidocaine  5 % Commonly known as: Lidoderm  Place 1 patch onto the skin daily. Remove & Discard patch  within 12 hours or as directed by MD   losartan  100 MG tablet Commonly known as: COZAAR  Take 1 tablet by mouth once daily   nitroGLYCERIN  0.4 MG SL tablet Commonly known as: NITROSTAT  Place 1 tablet (0.4 mg total) under the tongue every 5 (five) minutes x 3 doses as needed for chest pain.   Nubeqa  300 MG tablet Generic drug: darolutamide  Take 1 tablet (300 mg total) by mouth 2 (two) times daily. Take with food.   oxyCODONE -acetaminophen  5-325 MG tablet Commonly known as: PERCOCET/ROXICET Take 1 tablet by mouth every 6 (six) hours as needed for severe pain (pain score 7-10).   pantoprazole  20 MG tablet Commonly known as: PROTONIX  Take 1 tablet (20 mg total) by mouth every other day.   rosuvastatin  5 MG tablet Commonly known as: CRESTOR  Take 5 mg by mouth daily.   sertraline  25 MG tablet Commonly known as: ZOLOFT  Take 0.5 tablets (12.5 mg total) by mouth at bedtime.   tamsulosin  0.4 MG Caps capsule Commonly known as: Flomax  Take 1 capsule (0.4 mg total) by mouth at bedtime.   traZODone  50 MG tablet Commonly known as: DESYREL  Take 1 tablet (50 mg total) by mouth at bedtime.        Allergies: Allergies[1]  Family History: Family History  Adopted: Yes  Problem Relation Age of Onset   Other Mother        eczema    Cancer Mother    Other Other        Adopted - unaware of biological parent's histories.   Cancer Half-Sister    Stroke Neg Hx     Social History:  reports that he quit smoking about 9 years ago. His smoking use included cigarettes. He started smoking about 39 years ago. He has a 30 pack-year smoking history. He has been exposed to tobacco smoke. He has never used smokeless tobacco. He reports that he does not drink alcohol and does not use drugs.   Physical Exam: BP 131/61   Pulse (!) 58   Constitutional:  Alert and oriented, No acute distress. HEENT: [place] AT, moist mucus membranes.  Trachea midline, no masses. Cardiovascular: No clubbing, cyanosis,  or edema. Respiratory: Normal respiratory effort, no increased work of breathing. GI: Abdomen is soft, nontender, nondistended, no abdominal masses GU: No CVA tenderness Skin: No rashes, bruises or suspicious lesions. Neurologic: Grossly intact, no focal deficits, moving all 4 extremities. Psychiatric: Normal mood and affect.  Laboratory Data: Lab Results  Component Value Date   WBC 8.4 07/12/2024   HGB 11.8 (L) 07/12/2024   HCT 35.7 (L) 07/12/2024   MCV 93.0 07/12/2024   PLT 235 07/12/2024    Lab Results  Component Value Date   CREATININE 1.25 (H) 07/12/2024    No results found for: PSA  Lab Results  Component Value Date   TESTOSTERONE  <3 (L) 07/12/2024    Lab Results  Component Value Date   HGBA1C 5.8 (H) 04/22/2022    Urinalysis    Component Value Date/Time   COLORURINE AMBER (A) 05/03/2023 0453   APPEARANCEUR Clear 01/05/2024 1143   LABSPEC 1.014 05/03/2023 0453   PHURINE 5.0 05/03/2023 0453   GLUCOSEU Negative 01/05/2024 1143   HGBUR LARGE (A) 05/03/2023 0453   BILIRUBINUR Negative 01/05/2024 1143   KETONESUR NEGATIVE 05/03/2023 0453   PROTEINUR Negative 01/05/2024 1143   PROTEINUR 100 (A) 05/03/2023 0453   UROBILINOGEN 0.2 06/20/2015 0940   NITRITE Negative 01/05/2024 1143   NITRITE NEGATIVE 05/03/2023 0453   LEUKOCYTESUR Trace (A) 01/05/2024 1143   LEUKOCYTESUR SMALL (A) 05/03/2023 0453    Lab Results  Component Value Date   LABMICR See below: 01/05/2024   WBCUA 6-10 (A) 01/05/2024   LABEPIT 0-10 01/05/2024   MUCUS Present (A) 01/05/2024   BACTERIA None seen 01/05/2024    Pertinent Imaging: Reviewed spine/hip x-ray.  Reviewed Dr. Fredrik notes and labs.  Assessment & Plan:    1) h/o PCa - sees Dr. Tina. Stable. Given T < 3 did not give Eligard  today.   2) BPH, wk st - cr ~ 1.2 - improved.   No follow-ups on file.  Donnice Brooks, MD  [facility] [place]  [address] [place], [place] [address] [phone]      [1]  Allergies Allergen Reactions   Latex Hives, Itching, Rash and Other (See Comments)    Blisters

## 2024-07-19 NOTE — Assessment & Plan Note (Addendum)
 Supportive baseline bone mineral density calcium  (1000-1200 mg daily from food and supplements) and vitamin D3 (1000 IU daily) Healthy lifestyle to prevent diabetes and CV disease Heart healthy diet Aggressive cardiovascular risk management exercises daily as able Limit alcohol consumption and avoid smoking

## 2024-07-19 NOTE — Assessment & Plan Note (Addendum)
 On rosuvastatin

## 2024-07-19 NOTE — Progress Notes (Unsigned)
 [facility] OFFICE PROGRESS NOTE  Patient Care Team: Damon Righter, MD as PCP - General (Family Medicine) Damon Walker ORN, MD as PCP - Cardiology (Cardiology) Damon Pfeiffer, MD as Consulting Physician (Neurology) Damon Anes, MD as Consulting Physician (Ophthalmology) Damon Pont, RN as Oncology Nurse Navigator  Damon Walker is a 78 y.o.male with history of ischemic cardiomyopathy EF of 30-35%, MI, CAD on plavix , HTN, being [facility] cancer.   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 GG5 GS 4+5. iPSA 24.   Current diagnosis: mHSPC Germline testing:  Genetic testing reported out on 04/05/24 through the [facility] CancerNext-Expanded +RNAinsight panel found no pathogenic mutations  Somatic testing: not yet Treatment: ADT(07/2023) /Daro (10/2023)  Due to side effects. More fatigue, weakness, decreased darolutamide  to 300 mg twice daily.  PSA decreased to less than 0.2.  Most recently 0.07.   Osteopenia. Recommend vit D and calcium  and exercises as able.  Clinically performance status is fairly borderline.  No active chest pain or cardiac symptoms.  We discussed risk and benefit of continue ADT, with ARPI again.  Discussed increased risk of MI, heart failure, stroke etc. with cardiac risk factors.  Patient elected not to have another pill like Orgovyx.  Therefore we will consider resuming Lupron  next month with 3 months dosage.  In the meantime, he will continue darolutamide .  He will return in about 6 weeks with repeat labs, Lupron , Zometa as well.  Assessment & Plan Prostate cancer [facility]) Continue ADT, last Eligard  on 01/07/2024. Will see if can switch to Orgovyx in Dec. Decrease darolutamide  300 mg twice daily Repeat PSA and lab next month. Last ADT was 01/07/24 45 mg dose Negative genetic testing. At risk for side effect of  medication Supportive baseline bone mineral density calcium  (1000-1200 mg daily from food and supplements) and vitamin D3 (1000 IU daily) Healthy lifestyle to prevent diabetes and CV disease Heart healthy diet Aggressive cardiovascular risk management exercises daily as able Limit alcohol consumption and avoid smoking Hyperlipidemia with target low density lipoprotein (LDL) cholesterol less than 55 mg/dL On rosuvastatin   Essential hypertension On losartan , carvedilol  Osteopenia of multiple sites Reported no teeth and has dentures Zometa every 6 months Continue calcium  (1000-1200 mg daily from food and supplements) and vitamin D3 (1000 IU daily)  Orders Placed This Encounter  Procedures   CBC with Differential (Cancer Center Only)    Standing Status:   Future    Expiration Date:   07/22/2025   CMP (Cancer Center only)    Standing Status:   Future    Expiration Date:   07/22/2025   PSA    Standing Status:   Future    Expiration Date:   07/22/2025   Testosterone     Standing Status:   Future    Expiration Date:   07/22/2025     Damon Damon Chihuahua, MD  INTERVAL HISTORY: Patient returns for follow-up with wife Damon Walker. Report stop proscar . No trouble urinating. Fatigue. He has decreased dose of darolutamide . No chest pain, short of breath, coughing, diarrhea, falling. Appetite is good.   Oncology History  Prostate cancer [facility])  03/2023 Initial Diagnosis   Prostate cancer [facility])  Patient presented in August 2024 with retention and a PSA of 24. Report a nodular prostate on exam.   He underwent TURP and prostate biopsy October 2024. Path was  rostate chips was GG 5 in 70% of the resected tissue and GG 5 disease in 60 to 95% and 6 of 6 cores from the TRUS biopsy.    06/27/2023 PET scan   PSMA PET prostate gland activity, left and right seminal vesicle invasion, bilateral pelvic, retroperitoneal, mediastinal and supraclavicular lymphadenopathy. No bone lesions.    07/2023 -   Chemotherapy   Started Eligard    10/2023 -  Chemotherapy   Started darolutamide    11/2023 Tumor Marker   PSA 1.2   01/2024 Tumor Marker   PSA 0.6. T<3   01/08/2024 Cancer Staging   Staging form: Prostate, AJCC 8th Edition - Clinical: Stage IVB (cT3b, cN1, cM1a, PSA: 24, Grade Group: 5) - Signed by Damon Damon BROCKS, MD on 01/08/2024 Prostate specific antigen (PSA) range: 20 or greater Histologic grading system: 5 grade system   01/12/2024 Cancer Staging   Staging form: Prostate, AJCC 8th Edition - Pathologic: Stage IVB (pT3b, pN1, cM1, PSA: 24, Grade Group: 5) - Signed by Damon Damon BROCKS, MD on 01/12/2024 Prostate specific antigen (PSA) range: 20 or greater Histologic grading system: 5 grade system   02/23/2024 Tumor Marker   PSA 0.3    Genetic Testing   Negative CancerNext-Expanded +RNAinsight panel. The CancerNext-Expanded gene panel offered by [facility] and includes sequencing, rearrangement, and RNA analysis for the following 77 genes: AIP, ALK, APC, ATM, AXIN2, BAP1, BARD1, BMPR1A, BRCA1, BRCA2, BRIP1, CDC73, CDH1, CDK4, CDKN1B, CDKN2A, CEBPA, CHEK2, CTNNA1, DDX41, DICER1, EGFR, EPCAM, ETV6, FH, FLCN, GATA2, GREM1, HOXB13, KIT, LZTR1, MAX, MBD4, MEN1, MET, MITF, MLH1, MSH2, MSH3, MSH6, MUTYH, NF1, NF2, NTHL1, PALB2, PDGFRA, PHOX2B, PMS2, POLD1, POLE, POT1, PRKAR1A, PTCH1, PTEN, RAD51C, RAD51D, RB1, RET, RPS20, RUNX1, SDHA, SDHAF2, SDHB, SDHC, SDHD, SMAD4, SMARCA4, SMARCB1, SMARCE1, STK11, SUFU, TMEM127, TP53, TSC1, TSC2, VHL, WT1. Report date 04/05/24.       PHYSICAL EXAMINATION: ECOG PERFORMANCE STATUS: 3  Vitals:   07/22/24 0856  BP: (!) 129/57  Pulse: 65  Resp: 16  Temp: (!) 97.3 F (36.3 C)  SpO2: 96%   Filed Weights   07/22/24 0856  Weight: 194 lb (88 kg)    GENERAL: alert, no distress and comfortable SKIN: skin color normal and no jaundice  LUNGS: clear to auscultation and no wheeze or rales with normal breathing effort HEART: regular rate & rhythm  ABDOMEN:  abdomen soft, non-tender and nondistended. Musculoskeletal: no edema   Relevant data reviewed during this visit included labs.  New labs ordered.

## 2024-07-20 ENCOUNTER — Other Ambulatory Visit: Payer: Self-pay

## 2024-07-21 ENCOUNTER — Other Ambulatory Visit: Payer: Self-pay

## 2024-07-21 NOTE — Progress Notes (Signed)
 Specialty Pharmacy Refill Coordination Note  Damon Walker is a 78 y.o. male contacted today regarding refills of specialty medication(s) Darolutamide  (Nubeqa )   Patient requested [name] at [facility] at [place] date: 07/26/24   Medication will be filled on: 07/23/24

## 2024-07-22 ENCOUNTER — Inpatient Hospital Stay: Payer: Medicare (Managed Care)

## 2024-07-22 VITALS — BP 129/57 | HR 65 | Temp 97.3°F | Resp 16 | Ht 67.0 in | Wt 194.0 lb

## 2024-07-22 DIAGNOSIS — I10 Essential (primary) hypertension: Secondary | ICD-10-CM | POA: Diagnosis not present

## 2024-07-22 DIAGNOSIS — M8589 Other specified disorders of bone density and structure, multiple sites: Secondary | ICD-10-CM | POA: Insufficient documentation

## 2024-07-22 DIAGNOSIS — E785 Hyperlipidemia, unspecified: Secondary | ICD-10-CM | POA: Diagnosis not present

## 2024-07-22 DIAGNOSIS — Z9189 Other specified personal risk factors, not elsewhere classified: Secondary | ICD-10-CM

## 2024-07-22 DIAGNOSIS — C61 Malignant neoplasm of prostate: Secondary | ICD-10-CM | POA: Diagnosis not present

## 2024-07-22 NOTE — Assessment & Plan Note (Addendum)
 On losartan , carvedilol 

## 2024-07-22 NOTE — Assessment & Plan Note (Addendum)
 Reported no teeth and has dentures Zometa every 6 months Continue calcium  (1000-1200 mg daily from food and supplements) and vitamin D3 (1000 IU daily)

## 2024-07-23 ENCOUNTER — Other Ambulatory Visit: Payer: Self-pay

## 2024-07-31 ENCOUNTER — Other Ambulatory Visit: Payer: Self-pay | Admitting: Cardiology

## 2024-08-18 ENCOUNTER — Other Ambulatory Visit: Payer: Self-pay

## 2024-08-18 ENCOUNTER — Other Ambulatory Visit: Payer: Self-pay | Admitting: Pharmacy Technician

## 2024-08-19 ENCOUNTER — Other Ambulatory Visit: Payer: Self-pay

## 2024-08-23 ENCOUNTER — Other Ambulatory Visit (HOSPITAL_COMMUNITY): Payer: Self-pay

## 2024-08-25 ENCOUNTER — Other Ambulatory Visit: Payer: Self-pay

## 2024-08-25 ENCOUNTER — Other Ambulatory Visit (HOSPITAL_COMMUNITY): Payer: Self-pay

## 2024-08-26 ENCOUNTER — Other Ambulatory Visit: Payer: Self-pay | Admitting: Vascular Surgery

## 2024-08-26 ENCOUNTER — Other Ambulatory Visit: Payer: Self-pay

## 2024-08-26 DIAGNOSIS — I6523 Occlusion and stenosis of bilateral carotid arteries: Secondary | ICD-10-CM

## 2024-08-26 NOTE — Progress Notes (Signed)
 Specialty Pharmacy Refill Coordination Note  Damon Walker is a 79 y.o. male contacted today regarding refills of specialty medication(s) Darolutamide  (Nubeqa )   Patient requested [name] at [facility] at [place] date: 09/01/24   Medication will be filled on: 08/31/24

## 2024-08-27 ENCOUNTER — Other Ambulatory Visit: Payer: Self-pay

## 2024-08-30 ENCOUNTER — Other Ambulatory Visit: Payer: Self-pay

## 2024-08-31 ENCOUNTER — Other Ambulatory Visit: Payer: Self-pay

## 2024-09-01 ENCOUNTER — Inpatient Hospital Stay: Payer: Medicare (Managed Care)

## 2024-09-01 DIAGNOSIS — C61 Malignant neoplasm of prostate: Secondary | ICD-10-CM | POA: Insufficient documentation

## 2024-09-01 DIAGNOSIS — I10 Essential (primary) hypertension: Secondary | ICD-10-CM | POA: Diagnosis not present

## 2024-09-01 DIAGNOSIS — Z9079 Acquired absence of other genital organ(s): Secondary | ICD-10-CM | POA: Insufficient documentation

## 2024-09-01 DIAGNOSIS — M8589 Other specified disorders of bone density and structure, multiple sites: Secondary | ICD-10-CM | POA: Insufficient documentation

## 2024-09-01 DIAGNOSIS — Z79899 Other long term (current) drug therapy: Secondary | ICD-10-CM | POA: Diagnosis not present

## 2024-09-01 LAB — CBC WITH DIFFERENTIAL (CANCER CENTER ONLY)
Abs Immature Granulocytes: 0.02 10*3/uL (ref 0.00–0.07)
Basophils Absolute: 0.1 10*3/uL (ref 0.0–0.1)
Basophils Relative: 1 %
Eosinophils Absolute: 0.4 10*3/uL (ref 0.0–0.5)
Eosinophils Relative: 4 %
HCT: 32.1 % — ABNORMAL LOW (ref 39.0–52.0)
Hemoglobin: 10.6 g/dL — ABNORMAL LOW (ref 13.0–17.0)
Immature Granulocytes: 0 %
Lymphocytes Relative: 24 %
Lymphs Abs: 2.1 10*3/uL (ref 0.7–4.0)
MCH: 30.5 pg (ref 26.0–34.0)
MCHC: 33 g/dL (ref 30.0–36.0)
MCV: 92.5 fL (ref 80.0–100.0)
Monocytes Absolute: 0.7 10*3/uL (ref 0.1–1.0)
Monocytes Relative: 8 %
Neutro Abs: 5.7 10*3/uL (ref 1.7–7.7)
Neutrophils Relative %: 63 %
Platelet Count: 267 10*3/uL (ref 150–400)
RBC: 3.47 MIL/uL — ABNORMAL LOW (ref 4.22–5.81)
RDW: 13.5 % (ref 11.5–15.5)
WBC Count: 9 10*3/uL (ref 4.0–10.5)
nRBC: 0 % (ref 0.0–0.2)

## 2024-09-01 LAB — CMP (CANCER CENTER ONLY)
ALT: 13 U/L (ref 0–44)
AST: 19 U/L (ref 15–41)
Albumin: 4.1 g/dL (ref 3.5–5.0)
Alkaline Phosphatase: 50 U/L (ref 38–126)
Anion gap: 11 (ref 5–15)
BUN: 20 mg/dL (ref 8–23)
CO2: 24 mmol/L (ref 22–32)
Calcium: 9.4 mg/dL (ref 8.9–10.3)
Chloride: 107 mmol/L (ref 98–111)
Creatinine: 1.22 mg/dL (ref 0.61–1.24)
GFR, Estimated: 60 mL/min — ABNORMAL LOW
Glucose, Bld: 99 mg/dL (ref 70–99)
Potassium: 4.6 mmol/L (ref 3.5–5.1)
Sodium: 143 mmol/L (ref 135–145)
Total Bilirubin: 0.9 mg/dL (ref 0.0–1.2)
Total Protein: 7.2 g/dL (ref 6.5–8.1)

## 2024-09-01 LAB — PSA: Prostatic Specific Antigen: 0.05 ng/mL (ref 0.00–4.00)

## 2024-09-02 LAB — TESTOSTERONE: Testosterone: 9 ng/dL — ABNORMAL LOW (ref 264–916)

## 2024-09-02 NOTE — Progress Notes (Unsigned)
 [facility] OFFICE PROGRESS NOTE  Patient Care Team: Kip Righter, MD as PCP - General (Family Medicine) Anner Alm ORN, MD as PCP - Cardiology (Cardiology) Jerri Pfeiffer, MD as Consulting Physician (Neurology) Roz Anes, MD as Consulting Physician (Ophthalmology) Vertell Pont, RN as Oncology Nurse Navigator  Damon Walker is a 79 y.o.male with history of ischemic cardiomyopathy EF of 30-35%, MI, CAD on plavix , HTN, being [facility] for prostate cancer.   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 iPSA 24.   Current diagnosis: mHSPC Germline testing:  Genetic testing reported out on 04/05/24 through the [facility] CancerNext-Expanded +RNAinsight panel found no pathogenic mutations  Somatic testing: not yet Treatment: ADT(07/2023) /Daro (10/2023)   Due to side effects. More fatigue, weakness, decreased darolutamide  to 300 mg twice daily.   PSA decreased to less than 0.2.  Most recently 0.07.   Osteopenia. Recommend vit D and calcium  and exercises as able.   Clinically performance status is fairly borderline.  No active chest pain or cardiac symptoms.  We discussed risk and benefit of continue ADT, with ARPI again.  Discussed increased risk of MI, heart failure, stroke etc. with cardiac risk factors.  Patient elected not to have another pill like Orgovyx.  Therefore we will consider resuming Lupron  next month with 3 months dosage.  In the meantime, he will continue darolutamide .  He will return in about 6 weeks with repeat labs, Lupron , Zometa as well.   Assessment & Plan Prostate cancer [facility]) Continue ADT, last Eligard  on 01/07/2024. Will see if can switch to Orgovyx in Dec. Decrease darolutamide  300 mg twice daily Repeat PSA and lab next month. Last ADT was 01/07/24 45 mg dose Negative genetic testing. 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  Essential hypertension On losartan , carvedilol   No orders of the defined types were placed in this encounter.    Damon JAYSON Chihuahua, MD  INTERVAL HISTORY: Patient returns for follow-up.  Oncology History  Prostate cancer [facility])  03/2023 Initial Diagnosis   Prostate cancer [facility])  Patient presented in August 2024 with retention and a PSA of 24. Report a nodular prostate on exam.   He underwent TURP and prostate biopsy October 2024. 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    06/27/2023 PET scan   PSMA PET prostate gland activity, left and right seminal vesicle invasion, bilateral pelvic, retroperitoneal, mediastinal and supraclavicular lymphadenopathy. No bone lesions.    07/2023 -  Chemotherapy   Started Eligard    10/2023 -  Chemotherapy   Started darolutamide    11/2023 Tumor Marker   PSA 1.2   01/2024 Tumor Marker   PSA 0.6. T<3   01/08/2024 Cancer Staging   Staging form: Prostate, AJCC 8th Edition - Clinical: Stage IVB (cT3b, cN1, cM1a, PSA: 24, Grade Group: 5) - Signed by Walker Damon JAYSON, MD on 01/08/2024 Prostate specific antigen (PSA) range: 20 or greater Histologic grading system: 5 grade system   01/12/2024 Cancer Staging   Staging form: Prostate, AJCC 8th Edition - Pathologic: Stage IVB (pT3b, pN1, cM1, PSA: 24, Grade Group: 5) - Signed by Walker Damon JAYSON, MD on 01/12/2024 Prostate  specific antigen (PSA) range: 20 or greater Histologic grading system: 5 grade system   02/23/2024 Tumor Marker   PSA 0.3    Genetic Testing   Negative CancerNext-Expanded +RNAinsight panel. The CancerNext-Expanded gene panel offered by [facility] and includes sequencing, rearrangement, and RNA analysis for the following 77 genes: AIP, ALK, APC, ATM, AXIN2, BAP1, BARD1, BMPR1A,  BRCA1, BRCA2, BRIP1, CDC73, CDH1, CDK4, CDKN1B, CDKN2A, CEBPA, CHEK2, CTNNA1, DDX41, DICER1, EGFR, EPCAM, ETV6, FH, FLCN, GATA2, GREM1, HOXB13, KIT, LZTR1, MAX, MBD4, MEN1, MET, MITF, MLH1, MSH2, MSH3, MSH6, MUTYH, NF1, NF2, NTHL1, PALB2, PDGFRA, PHOX2B, PMS2, POLD1, POLE, POT1, PRKAR1A, PTCH1, PTEN, RAD51C, RAD51D, RB1, RET, RPS20, RUNX1, SDHA, SDHAF2, SDHB, SDHC, SDHD, SMAD4, SMARCA4, SMARCB1, SMARCE1, STK11, SUFU, TMEM127, TP53, TSC1, TSC2, VHL, WT1. Report date 04/05/24.       PHYSICAL EXAMINATION: ECOG PERFORMANCE STATUS: {CHL ONC ECOG PS:[phone]}  There were no vitals filed for this visit. There were no vitals filed for this visit.  GENERAL: alert, no distress and comfortable SKIN: skin color normal and no jaundice or bruising or petechiae on exposed skin EYES: normal, sclera clear OROPHARYNX: no exudate  NECK: No palpable mass LYMPH:  no palpable cervical, axillary lymphadenopathy  LUNGS: clear to auscultation and no wheeze or rales with normal breathing effort HEART: regular rate & rhythm  ABDOMEN: abdomen soft, non-tender and nondistended. Musculoskeletal: no edema NEURO: no focal motor/sensory deficits  Relevant data reviewed during this visit included labs.  New labs ordered.

## 2024-09-02 NOTE — Assessment & Plan Note (Addendum)
 On losartan , carvedilol 

## 2024-09-02 NOTE — Assessment & Plan Note (Addendum)
 Continue ADT, last Eligard  on 01/07/2024. Will see if can switch to Orgovyx in Dec. Decrease darolutamide  300 mg twice daily Repeat PSA and lab next month. Last ADT was 01/07/24 45 mg dose Negative genetic testing.

## 2024-09-02 NOTE — Assessment & Plan Note (Addendum)
 H/o CVA in 2023 Remains on Plavix  and rosuvastatin  with adequate BP control.

## 2024-09-03 ENCOUNTER — Other Ambulatory Visit: Payer: Self-pay

## 2024-09-03 ENCOUNTER — Inpatient Hospital Stay: Payer: Medicare (Managed Care)

## 2024-09-03 ENCOUNTER — Other Ambulatory Visit (HOSPITAL_COMMUNITY): Payer: Self-pay

## 2024-09-03 VITALS — BP 112/55 | HR 68 | Temp 97.9°F | Resp 17 | Ht 67.0 in | Wt 184.0 lb

## 2024-09-03 DIAGNOSIS — C61 Malignant neoplasm of prostate: Secondary | ICD-10-CM | POA: Diagnosis not present

## 2024-09-03 DIAGNOSIS — M8589 Other specified disorders of bone density and structure, multiple sites: Secondary | ICD-10-CM | POA: Diagnosis not present

## 2024-09-03 DIAGNOSIS — I10 Essential (primary) hypertension: Secondary | ICD-10-CM

## 2024-09-03 DIAGNOSIS — I63532 Cerebral infarction due to unspecified occlusion or stenosis of left posterior cerebral artery: Secondary | ICD-10-CM

## 2024-09-03 MED ORDER — ZOLEDRONIC ACID 4 MG/100ML IV SOLN
4.0000 mg | Freq: Once | INTRAVENOUS | Status: AC
  Administered 2024-09-03: 4 mg via INTRAVENOUS
  Filled 2024-09-03: qty 100

## 2024-09-03 MED ORDER — NUBEQA 300 MG PO TABS
600.0000 mg | ORAL_TABLET | Freq: Two times a day (BID) | ORAL | 11 refills | Status: AC
  Filled 2024-09-03 – 2024-09-06 (×2): qty 120, 30d supply, fill #0

## 2024-09-03 MED ORDER — SODIUM CHLORIDE 0.9 % IV SOLN: INTRAVENOUS | Status: DC

## 2024-09-03 MED ORDER — LEUPROLIDE ACETATE (3 MONTH) 22.5 MG ~~LOC~~ KIT
22.5000 mg | PACK | Freq: Once | SUBCUTANEOUS | Status: AC
  Administered 2024-09-03: 22.5 mg via SUBCUTANEOUS
  Filled 2024-09-03: qty 22.5

## 2024-09-03 NOTE — Assessment & Plan Note (Addendum)
 Reported no teeth and has dentures Zometa every 6 months Continue calcium  (1000-1200 mg daily from food and supplements) and vitamin D3 (1000 IU daily)

## 2024-09-03 NOTE — Assessment & Plan Note (Addendum)
 Continue ADT, will get Lupron  3-mo dose today. Declined switch to Orgovyx. Continue at decreased darolutamide  300 mg twice daily Repeat PSA and lab in 3 months Negative genetic testing.

## 2024-09-03 NOTE — Progress Notes (Signed)
 [facility] OFFICE PROGRESS NOTE  Patient Care Team: Kip Righter, Damon Walker as PCP - General (Family Medicine) Anner Alm ORN, Damon Walker as PCP - Cardiology (Cardiology) Jerri Pfeiffer, Damon Walker as Consulting Physician (Neurology) Roz Anes, Damon Walker as Consulting Physician (Ophthalmology) Vertell Pont, RN as Oncology Nurse Navigator  Damon Walker is a 79 y.o.male with history of ischemic cardiomyopathy EF of 30-35%, MI, CAD on plavix , HTN, being [facility] for prostate cancer.   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 iPSA 24.   Current diagnosis: mHSPC Germline testing:  Genetic testing reported out on 04/05/24 through the [facility] CancerNext-Expanded +RNAinsight panel found no pathogenic mutations  Somatic testing: not yet Treatment: ADT(07/2023) /Daro (10/2023)   Due to side effects. More fatigue, weakness, decreased darolutamide  to 300 mg twice daily.   PSA decreased to less than 0.2.  Most recently 0.05.   Osteopenia. Recommend vit D and calcium  and exercises as able.   Clinically performance status is fairly borderline.  No active chest pain or cardiac symptoms.  We discussed risk and benefit of continue ADT, with ARPI again.  Discussed increased risk of MI, heart failure, stroke etc. with cardiac risk factors considering Orgovyx.  Patient elected not to have another pill like Orgovyx.  Therefore we will consider resuming Lupron  next month with 3 months dosage.  In the meantime, he will continue darolutamide .    He will return in about 12 weeks with repeat labs, Lupron  as well. See me a few days after next labs. Zometa  today.  Last the EF 30 to 35%.  Will need to continue to monitor.  Darolutamide  has the lower risk of cardiovascular event from meta-analysis compared to other agents. JAMA Oncology. 2024. El-Taji MALVA Vonita GORMAN Joshua JAYSON, et  al. Assessment & Plan Prostate cancer [facility]) Continue ADT, will get Lupron  3-mo dose today. Declined switch to Orgovyx. Continue at decreased darolutamide  300 mg twice daily Repeat PSA and lab in 3 months Negative genetic testing. Osteopenia of multiple sites Reported no teeth and has dentures Zometa  every 6 months Continue calcium  (1000-1200 mg daily from food and supplements) and vitamin D3 (1000 IU daily) Essential hypertension On losartan , carvedilol   Lab, and Lupron  on 4/30 and see me on 5/1  Orders Placed This Encounter  Procedures   CBC with Differential (Cancer Center Only)    Standing Status:   Future    Expiration Date:   09/03/2025   CMP (Cancer Center only)    Standing Status:   Future    Expiration Date:   09/03/2025   PSA    Standing Status:   Future    Expiration Date:   09/03/2025   Testosterone     Standing Status:   Future    Expiration Date:   09/03/2025     Damon JAYSON Chihuahua, Damon Walker  INTERVAL HISTORY: Patient returns for follow-up. No change in taking 300 mg of daro. No chest pain, short of breath. He is walking daily, chores. No worsening leg swelling. He hardly has to take lasix . Appointment is coming up with Cardiology.  No new bone or back pain. He sees Dr. Renelda every 6 months. He has incontinence.  No dental or jaw pain.  Oncology History  Prostate cancer [facility])  03/2023 Initial Diagnosis   Prostate cancer [facility])  Patient presented in August 2024 with retention and a PSA  of 24. Report a nodular prostate on exam.   He underwent TURP and prostate biopsy October 2024. 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    06/27/2023 PET scan   PSMA PET prostate gland activity, left and right seminal vesicle invasion, bilateral pelvic, retroperitoneal, mediastinal and supraclavicular lymphadenopathy. No bone lesions.    07/2023 -  Chemotherapy   Started Eligard    10/2023 -   Chemotherapy   Started darolutamide    11/2023 Tumor Marker   PSA 1.2   01/2024 Tumor Marker   PSA 0.6. T<3   01/08/2024 Cancer Staging   Staging form: Prostate, AJCC 8th Edition - Clinical: Stage IVB (cT3b, cN1, cM1a, PSA: 24, Grade Group: 5) - Signed by Tina Damon BROCKS, Damon Walker on 01/08/2024 Prostate specific antigen (PSA) range: 20 or greater Histologic grading system: 5 grade system   01/12/2024 Cancer Staging   Staging form: Prostate, AJCC 8th Edition - Pathologic: Stage IVB (pT3b, pN1, cM1, PSA: 24, Grade Group: 5) - Signed by Tina Damon BROCKS, Damon Walker on 01/12/2024 Prostate specific antigen (PSA) range: 20 or greater Histologic grading system: 5 grade system   02/23/2024 Tumor Marker   PSA 0.3    Genetic Testing   Negative CancerNext-Expanded +RNAinsight panel. The CancerNext-Expanded gene panel offered by [facility] and includes sequencing, rearrangement, and RNA analysis for the following 77 genes: AIP, ALK, APC, ATM, AXIN2, BAP1, BARD1, BMPR1A, BRCA1, BRCA2, BRIP1, CDC73, CDH1, CDK4, CDKN1B, CDKN2A, CEBPA, CHEK2, CTNNA1, DDX41, DICER1, EGFR, EPCAM, ETV6, FH, FLCN, GATA2, GREM1, HOXB13, KIT, LZTR1, MAX, MBD4, MEN1, MET, MITF, MLH1, MSH2, MSH3, MSH6, MUTYH, NF1, NF2, NTHL1, PALB2, PDGFRA, PHOX2B, PMS2, POLD1, POLE, POT1, PRKAR1A, PTCH1, PTEN, RAD51C, RAD51D, RB1, RET, RPS20, RUNX1, SDHA, SDHAF2, SDHB, SDHC, SDHD, SMAD4, SMARCA4, SMARCB1, SMARCE1, STK11, SUFU, TMEM127, TP53, TSC1, TSC2, VHL, WT1. Report date 04/05/24.       PHYSICAL EXAMINATION: ECOG PERFORMANCE STATUS: 2  VSS  GENERAL: alert, no distress and comfortable SKIN: skin color normal and no jaundice  LUNGS: clear to auscultation and no wheeze or rales with normal breathing effort HEART: regular rate & rhythm  ABDOMEN: abdomen soft, non-tender and nondistended. Musculoskeletal: no edema   Relevant data reviewed during this visit included labs.  New labs ordered.

## 2024-09-03 NOTE — Patient Instructions (Signed)
 Zoledronic Acid Injection  What is this medication? ZOLEDRONIC ACID (ZOE le dron ik AS id) treats high calcium levels in the blood caused by cancer. It may also be used with chemotherapy to treat weakened bones caused by cancer. It works by slowing down the release of calcium from bones. This lowers calcium levels in your blood. It also makes your bones stronger and less likely to break (fracture). It belongs to a group of medications called bisphosphonates. This medicine may be used for other purposes; ask your health care provider or pharmacist if you have questions. COMMON BRAND NAME(S): Zometa, Zometa Powder What should I tell my care team before I take this medication? They need to know if you have any of these conditions: Dehydration Dental disease Kidney disease Liver disease Low levels of calcium in the blood Lung or breathing disease, such as asthma Receiving steroids, such as dexamethasone or prednisone An unusual or allergic reaction to zoledronic acid, other medications, foods, dyes, or preservatives Pregnant or trying to get pregnant Breast-feeding How should I use this medication? This medication is injected into a vein. It is given by your care team in a hospital or clinic setting. Talk to your care team about the use of this medication in children. Special care may be needed. Overdosage: If you think you have taken too much of this medicine contact a poison control center or emergency room at once. NOTE: This medicine is only for you. Do not share this medicine with others. What if I miss a dose? Keep appointments for follow-up doses. It is important not to miss your dose. Call your care team if you are unable to keep an appointment. What may interact with this medication? Certain antibiotics given by injection Diuretics, such as bumetanide, furosemide NSAIDs, medications for pain and inflammation, such as ibuprofen or naproxen Teriparatide Thalidomide This list may not  describe all possible interactions. Give your health care provider a list of all the medicines, herbs, non-prescription drugs, or dietary supplements you use. Also tell them if you smoke, drink alcohol, or use illegal drugs. Some items may interact with your medicine. What should I watch for while using this medication? Visit your care team for regular checks on your progress. It may be some time before you see the benefit from this medication. Some people who take this medication have severe bone, joint, or muscle pain. This medication may also increase your risk for jaw problems or a broken thigh bone. Tell your care team right away if you have severe pain in your jaw, bones, joints, or muscles. Tell you care team if you have any pain that does not go away or that gets worse. Tell your dentist and dental surgeon that you are taking this medication. You should not have major dental surgery while on this medication. See your dentist to have a dental exam and fix any dental problems before starting this medication. Take good care of your teeth while on this medication. Make sure you see your dentist for regular follow-up appointments. You should make sure you get enough calcium and vitamin D while you are taking this medication. Discuss the foods you eat and the vitamins you take with your care team. Check with your care team if you have severe diarrhea, nausea, and vomiting, or if you sweat a lot. The loss of too much body fluid may make it dangerous for you to take this medication. You may need bloodwork while taking this medication. Talk to your care team if  you wish to become pregnant or think you might be pregnant. This medication can cause serious birth defects. What side effects may I notice from receiving this medication? Side effects that you should report to your care team as soon as possible: Allergic reactions--skin rash, itching, hives, swelling of the face, lips, tongue, or throat Kidney  injury--decrease in the amount of urine, swelling of the ankles, hands, or feet Low calcium level--muscle pain or cramps, confusion, tingling, or numbness in the hands or feet Osteonecrosis of the jaw--pain, swelling, or redness in the mouth, numbness of the jaw, poor healing after dental work, unusual discharge from the mouth, visible bones in the mouth Severe bone, joint, or muscle pain Side effects that usually do not require medical attention (report to your care team if they continue or are bothersome): Constipation Fatigue Fever Loss of appetite Nausea Stomach pain This list may not describe all possible side effects. Call your doctor for medical advice about side effects. You may report side effects to FDA at 1-800-FDA-1088. Where should I keep my medication? This medication is given in a hospital or clinic. It will not be stored at home. NOTE: This sheet is a summary. It may not cover all possible information. If you have questions about this medicine, talk to your doctor, pharmacist, or health care provider.  2024 Elsevier/Gold Standard (2021-09-14 00:00:00)

## 2024-09-03 NOTE — Patient Instructions (Addendum)
 Damon Walker received Lupron , Zometa  today Damon Walker return on 4/30 for lab and Lupron  shot again. Damon Walker return to see me on 5/1 for evaluation. You may increase Nubeqa  to 600 mg twice daily. Continue daily activities, take vitamin D and Calcium . Monitor for good blood pressure at home. Continue optimize cardiac condition with your cardiologist.

## 2024-09-03 NOTE — Assessment & Plan Note (Addendum)
 On losartan , carvedilol 

## 2024-09-03 NOTE — Assessment & Plan Note (Signed)
 Last EF 30-35% HF was 0.9% from ARANOTE trial. Same as placebo On Lasix  20 mg Continue monitor and follow up with cardiology as well

## 2024-09-06 ENCOUNTER — Other Ambulatory Visit: Payer: Self-pay

## 2024-09-07 ENCOUNTER — Other Ambulatory Visit: Payer: Self-pay | Admitting: Cardiology

## 2024-09-08 ENCOUNTER — Other Ambulatory Visit: Payer: Self-pay

## 2024-09-09 ENCOUNTER — Other Ambulatory Visit: Payer: Self-pay

## 2024-09-09 NOTE — Progress Notes (Signed)
 Specialty Pharmacy Refill Coordination Note  Damon Walker is a 79 y.o. male contacted today regarding refills of specialty medication(s) Darolutamide  (Nubeqa )   Patient requested [name] at [facility] at [employer] date: 09/11/24   Medication will be filled on: 09/10/24

## 2024-09-09 NOTE — Progress Notes (Signed)
 Specialty Pharmacy Ongoing Clinical Assessment Note  Evren Shankland is a 79 y.o. male who is being followed by the specialty pharmacy service for RxSp Oncology   Patient's specialty medication(s) reviewed today: Darolutamide  (Nubeqa )   Missed doses in the last 4 weeks: 0   Patient/Caregiver did not have any additional questions or concerns.   Therapeutic benefit summary: Patient is achieving benefit   Adverse events/side effects summary: Experienced adverse events/side effects (fatigue, weakness)   Patient's therapy is appropriate to: Continue    Goals Addressed             This Visit's Progress    Maintain optimal adherence to therapy   On track    Patient is on track. Patient will maintain adherence         Follow up: 3 months  [facility] Specialty Pharmacist

## 2024-09-09 NOTE — Progress Notes (Signed)
 Clinical Intervention Note  Clinical Intervention Notes: Patient's dose was increased back to 2 tablets twice daily due to no changes in side effects when patient was on lower dose   Clinical Intervention Outcomes: Improved therapy adherence; Prevention of an adverse drug event   [employer]

## 2024-09-10 ENCOUNTER — Other Ambulatory Visit: Payer: Self-pay

## 2024-09-17 ENCOUNTER — Ambulatory Visit: Payer: Medicare (Managed Care) | Admitting: Vascular Surgery

## 2024-09-17 ENCOUNTER — Ambulatory Visit (HOSPITAL_COMMUNITY): Payer: Medicare (Managed Care)

## 2024-10-05 ENCOUNTER — Ambulatory Visit: Payer: Medicare (Managed Care) | Admitting: Cardiology

## 2024-11-30 ENCOUNTER — Inpatient Hospital Stay: Payer: Medicare (Managed Care)

## 2024-12-09 ENCOUNTER — Inpatient Hospital Stay: Payer: Medicare (Managed Care)

## 2025-01-03 ENCOUNTER — Ambulatory Visit: Payer: Medicare (Managed Care) | Admitting: Urology
